# Patient Record
Sex: Female | Born: 1935
Health system: Southern US, Community
[De-identification: ages and names within clinical notes are randomized; demographics above are authoritative.]

## PROBLEM LIST (undated history)

## (undated) DIAGNOSIS — E7800 Pure hypercholesterolemia, unspecified: Secondary | ICD-10-CM

## (undated) DIAGNOSIS — F329 Major depressive disorder, single episode, unspecified: Secondary | ICD-10-CM

## (undated) DIAGNOSIS — J189 Pneumonia, unspecified organism: Secondary | ICD-10-CM

## (undated) DIAGNOSIS — G609 Hereditary and idiopathic neuropathy, unspecified: Secondary | ICD-10-CM

## (undated) DIAGNOSIS — I10 Essential (primary) hypertension: Secondary | ICD-10-CM

## (undated) DIAGNOSIS — I214 Non-ST elevation (NSTEMI) myocardial infarction: Secondary | ICD-10-CM

## (undated) DIAGNOSIS — Z9109 Other allergy status, other than to drugs and biological substances: Secondary | ICD-10-CM

## (undated) DIAGNOSIS — Z8719 Personal history of other diseases of the digestive system: Secondary | ICD-10-CM

## (undated) DIAGNOSIS — G43909 Migraine, unspecified, not intractable, without status migrainosus: Secondary | ICD-10-CM

## (undated) DIAGNOSIS — R079 Chest pain, unspecified: Secondary | ICD-10-CM

## (undated) DIAGNOSIS — R0602 Shortness of breath: Secondary | ICD-10-CM

## (undated) DIAGNOSIS — M8580 Other specified disorders of bone density and structure, unspecified site: Secondary | ICD-10-CM

## (undated) DIAGNOSIS — R51 Headache: Secondary | ICD-10-CM

## (undated) DIAGNOSIS — Z9289 Personal history of other medical treatment: Secondary | ICD-10-CM

## (undated) DIAGNOSIS — M1990 Unspecified osteoarthritis, unspecified site: Secondary | ICD-10-CM

## (undated) DIAGNOSIS — A310 Pulmonary mycobacterial infection: Secondary | ICD-10-CM

## (undated) DIAGNOSIS — F419 Anxiety disorder, unspecified: Secondary | ICD-10-CM

## (undated) DIAGNOSIS — I639 Cerebral infarction, unspecified: Secondary | ICD-10-CM

## (undated) DIAGNOSIS — F32A Depression, unspecified: Secondary | ICD-10-CM

## (undated) DIAGNOSIS — Z8619 Personal history of other infectious and parasitic diseases: Secondary | ICD-10-CM

## (undated) DIAGNOSIS — R519 Headache, unspecified: Secondary | ICD-10-CM

## (undated) DIAGNOSIS — I5189 Other ill-defined heart diseases: Secondary | ICD-10-CM

## (undated) DIAGNOSIS — E039 Hypothyroidism, unspecified: Secondary | ICD-10-CM

## (undated) DIAGNOSIS — K219 Gastro-esophageal reflux disease without esophagitis: Secondary | ICD-10-CM

## (undated) HISTORY — DX: Essential (primary) hypertension: I10

## (undated) HISTORY — DX: Other allergy status, other than to drugs and biological substances: Z91.09

## (undated) HISTORY — DX: Cerebral infarction, unspecified: I63.9

## (undated) HISTORY — PX: APPENDECTOMY: SHX54

## (undated) HISTORY — DX: Other specified disorders of bone density and structure, unspecified site: M85.80

## (undated) HISTORY — DX: Other ill-defined heart diseases: I51.89

## (undated) HISTORY — DX: Non-ST elevation (NSTEMI) myocardial infarction: I21.4

## (undated) HISTORY — DX: Hereditary and idiopathic neuropathy, unspecified: G60.9

## (undated) HISTORY — PX: DILATION AND CURETTAGE OF UTERUS: SHX78

---
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
## 1970-01-14 DIAGNOSIS — Z9289 Personal history of other medical treatment: Secondary | ICD-10-CM

## 1970-01-14 HISTORY — PX: TOTAL ABDOMINAL HYSTERECTOMY W/ BILATERAL SALPINGOOPHORECTOMY: SHX83

## 1970-01-14 HISTORY — DX: Personal history of other medical treatment: Z92.89

## 1976-11-14 DIAGNOSIS — Z8619 Personal history of other infectious and parasitic diseases: Secondary | ICD-10-CM

## 1976-11-14 HISTORY — DX: Personal history of other infectious and parasitic diseases: Z86.19

## 2006-04-18 ENCOUNTER — Encounter: Admission: RE | Admit: 2006-04-18 | Discharge: 2006-04-18 | Payer: Self-pay | Admitting: *Deleted

## 2007-03-29 ENCOUNTER — Emergency Department (HOSPITAL_COMMUNITY): Admission: EM | Admit: 2007-03-29 | Discharge: 2007-03-29 | Payer: Self-pay | Admitting: Emergency Medicine

## 2007-08-31 ENCOUNTER — Encounter: Admission: RE | Admit: 2007-08-31 | Discharge: 2007-08-31 | Payer: Self-pay | Admitting: *Deleted

## 2008-06-15 ENCOUNTER — Ambulatory Visit: Payer: Self-pay | Admitting: Emergency Medicine

## 2008-06-15 DIAGNOSIS — R05 Cough: Secondary | ICD-10-CM

## 2008-06-15 DIAGNOSIS — J45909 Unspecified asthma, uncomplicated: Secondary | ICD-10-CM

## 2008-06-15 DIAGNOSIS — I10 Essential (primary) hypertension: Secondary | ICD-10-CM | POA: Insufficient documentation

## 2008-06-15 DIAGNOSIS — J309 Allergic rhinitis, unspecified: Secondary | ICD-10-CM

## 2008-06-15 DIAGNOSIS — R059 Cough, unspecified: Secondary | ICD-10-CM | POA: Insufficient documentation

## 2008-07-29 ENCOUNTER — Ambulatory Visit: Payer: Self-pay | Admitting: Emergency Medicine

## 2008-07-29 DIAGNOSIS — J479 Bronchiectasis, uncomplicated: Secondary | ICD-10-CM | POA: Insufficient documentation

## 2008-08-10 ENCOUNTER — Ambulatory Visit: Payer: Self-pay | Admitting: Cardiology

## 2008-08-31 ENCOUNTER — Encounter: Admission: RE | Admit: 2008-08-31 | Discharge: 2008-08-31 | Payer: Self-pay | Admitting: Internal Medicine

## 2008-09-06 ENCOUNTER — Encounter: Admission: RE | Admit: 2008-09-06 | Discharge: 2008-09-06 | Payer: Self-pay | Admitting: Internal Medicine

## 2008-09-14 ENCOUNTER — Ambulatory Visit: Payer: Self-pay | Admitting: Emergency Medicine

## 2008-09-15 ENCOUNTER — Telehealth (INDEPENDENT_AMBULATORY_CARE_PROVIDER_SITE_OTHER): Payer: Self-pay | Admitting: *Deleted

## 2008-09-16 ENCOUNTER — Encounter: Payer: Self-pay | Admitting: Emergency Medicine

## 2008-11-04 ENCOUNTER — Encounter: Admission: RE | Admit: 2008-11-04 | Discharge: 2008-11-04 | Payer: Self-pay | Admitting: Internal Medicine

## 2008-11-15 ENCOUNTER — Ambulatory Visit: Payer: Self-pay | Admitting: Emergency Medicine

## 2008-12-23 ENCOUNTER — Encounter: Admission: RE | Admit: 2008-12-23 | Discharge: 2008-12-23 | Payer: Self-pay | Admitting: Internal Medicine

## 2009-02-28 ENCOUNTER — Ambulatory Visit: Payer: Self-pay | Admitting: Emergency Medicine

## 2009-07-06 ENCOUNTER — Encounter: Admission: RE | Admit: 2009-07-06 | Discharge: 2009-07-06 | Payer: Self-pay | Admitting: Internal Medicine

## 2009-08-23 ENCOUNTER — Ambulatory Visit: Payer: Self-pay | Admitting: Emergency Medicine

## 2010-02-05 ENCOUNTER — Encounter: Payer: Self-pay | Admitting: Internal Medicine

## 2010-02-13 NOTE — Assessment & Plan Note (Signed)
Summary: bronchiectasis   Visit Type:  Follow-up Primary Sarah [name]/Referring Sarah [name]:  Dr. Marijo Conception  CC:  Bronchiectasis.  the patient says she feels good. Occasional cough with dark mucus..  History of Present Illness: 77 yo never smoker, hx remote Legionella PNA, diagnosed with suspected asthma about 10 years ago. PFT's 7/10 with mixed disease, mild AFL. Formerly reated with Advair and albuterol, stopped when she moved to [place] in 2008. Tolerated the change well. Stayed on Singulair.   ROV 07/29/08 -- returns for f/u. Her cough is much improved. Last time we stopped enalapril, restarted fexofenadine. Stopped Advair also - doesn't think she's missed it, less hoarse. She has had PFT today as below.   ROV 09/14/08 -- Since last visit her cough got a little worse, productive of some small plugs, usually dark rarely with some blood-tinged mucous. PND is gone now that she is on an allergy regimen. Using ventolin rarely.   ROV 11/15/08 -- Returns after having sputum studies last visit - negative for AFB, fungal, bacterial. Developed viral URI symptoms about 10/20 - started to have some breathing pain, productive cough, yellow phlegm. Started on Augmentin, her cough and sputum improved. Didn't have to increase ventolin use. Still coughing frequently. Not using flutter on a schedule.   ROB 02/28/09 -- has been doing well since last time. Was treated with azithro in December after a URI, cough improved. Has albuterol available as needed. Was started on flovent with recent illness, but not taking now. Uses albuterol every couple days. Ran out of fexafenodine recently.   ROV 08/23/09 -- regular f/u for bronchiectasis. She is feling very well, the best she's been in over a year. Was treated for one episode of bronchitis in May treated with doxy. Hasn't been taking her fexofenadine, is on singulair. Taking flonase. Occas has used ventolin, about once a week.   Current Medications (verified): 1)  Levothroid 50 Mcg  Tabs (Levothyroxine Sodium) .... Once Daily 2)  Singulair 10 Mg Tabs (Montelukast Sodium) .... Once Daily 3)  Lexapro 20 Mg Tabs (Escitalopram Oxalate) .... Once Daily 4)  Ventolin Hfa 108 (90 Base) Mcg/act Aers (Albuterol Sulfate) .... As Needed 5)  Fexofenadine Hcl 180 Mg Tabs (Fexofenadine Hcl) .Marland Kitchen.. 1 By Mouth Once Daily 6)  Excedrin Extra Strength 250-250-65 Mg Tabs (Aspirin-Acetaminophen-Caffeine) .... As Directed As Needed 7)  Flonase 50 Mcg/act Susp (Fluticasone Propionate) .... 2 Sprays in Each Nostril Two Times A Day 8)  Lyrica 50 Mg Caps (Pregabalin) .... Take 1 Tablet By Mouth Two Times A Day  Allergies (verified): 1)  ! * Codeine Derivatives 2)  ! Levaquin 3)  ! Biaxin 4)  ! * Methacholine  Vital Signs:  Patient profile:   77 year old female Height:      68 inches (172.72 cm) Weight:      149.38 pounds (67.90 kg) BMI:     22.80 O2 Sat:      96 % on Room air Temp:     97.4 degrees F (36.33 degrees C) oral Pulse rate:   79 / minute BP sitting:   124 / 76  (right arm) Cuff size:   regular  Vitals Entered By: Michel Bickers CMA (August 23, 2009 9:49 AM)  O2 Sat at Rest %:  96 O2 Flow:  Room air CC: Bronchiectasis.  the patient says she feels good. Occasional cough with dark mucus. Comments Medications reviewed with the patient. Daytime phone verified. Michel Bickers CMA  August 23, 2009 9:49 AM   Physical Exam  General:  normal appearance, healthy appearing, and thin.   Head:  normocephalic and atraumatic Eyes:  conjunctiva and sclera clear Nose:  no deformity, discharge, inflammation, or lesions Mouth:  no deformity or lesions Neck:  no masses, thyromegaly, or abnormal cervical nodes Chest Wall:  kyphotic Lungs:  clear bilaterally Heart:  regular rate and rhythm, S1, S2 without murmurs, rubs, gallops, or clicks Abdomen:  not examined Msk:  no deformity or scoliosis noted with normal posture Extremities:  no clubbing, cyanosis, edema, or deformity  noted Neurologic:  non-focal Skin:  intact without lesions or rashes Psych:  alert and cooperative; normal mood and affect; normal attention span and concentration   Impression & Recommendations:  Problem # 1:  BRONCHIECTASIS (ICD-494.0)  Problem # 2:  ALLERGIC RHINITIS (ICD-477.9)  Her updated medication list for this problem includes:    Fexofenadine Hcl 180 Mg Tabs (Fexofenadine hcl) .Marland Kitchen... 1 by mouth once daily    Flonase 50 Mcg/act Susp (Fluticasone propionate) .Marland Kitchen... 2 sprays in each nostril two times a day  Orders: Est. Patient Level III (09811)  Problem # 3:  ASTHMA (ICD-493.90)  Patient Instructions: 1)  Continue your Singulair and Flonase as you are taking them.  2)  We can restart your fexofenadine if you begin to have more trouble with your allergies.  3)  Keep your Ventolin availabvle to use as needed  4)  Follow up with Dr Delton Coombes in 6 months or as needed

## 2010-02-13 NOTE — Assessment & Plan Note (Signed)
Summary: bronchiectasis   Visit Type:  Follow-up Primary Provider/Referring Provider:  Dr. Marijo Conception  CC:  Pt here for follow up. Pt was treated with zpack early December for fever and cough. Pt c/o productive cough with small amounts of yellow to white mucus and and S.O.B with activity. Pt states needs refill on Fexofenadine.  History of Present Illness: 77 yo never smoker, hx remote Legionella PNA, diagnosed with suspected asthma about 10 years ago. PFT's 7/10 with mixed disease, mild AFL. Formerly reated with Advair and albuterol, stopped when she moved to [place] in 2008. Tolerated the change well. Stayed on Singulair.   ROV 07/29/08 -- returns for f/u. Her cough is much improved. Last time we stopped enalapril, restarted fexofenadine. Stopped Advair also - doesn't think she's missed it, less hoarse. She has had PFT today as below.   ROV 09/14/08 -- Since last visit her cough got a little worse, productive of some small plugs, usually dark rarely with some blood-tinged mucous. PND is gone now that she is on an allergy regimen. Using ventolin rarely.   ROV 11/15/08 -- Returns after having sputum studies last visit - negative for AFB, fungal, bacterial. Developed viral URI symptoms about 10/20 - started to have some breathing pain, productive cough, yellow phlegm. Started on Augmentin, her cough and sputum improved. Didn't have to increase ventolin use. Still coughing frequently. Not using flutter on a schedule.   ROB 02/28/09 -- has been doing well since last time. Was treated with azithro in December after a URI, cough improved. Has albuterol available as needed. Was started on flovent with recent illness, but not taking now. Uses albuterol every couple days. Ran out of fexafenodine recently.   Current Medications (verified): 1)  Levothroid 50 Mcg Tabs (Levothyroxine Sodium) .... Once Daily 2)  Singulair 10 Mg Tabs (Montelukast Sodium) .... Once Daily 3)  Amitriptyline Hcl 10 Mg Tabs (Amitriptyline  Hcl) .... Once Daily 4)  Lexapro 20 Mg Tabs (Escitalopram Oxalate) .... Once Daily 5)  Ventolin Hfa 108 (90 Base) Mcg/act Aers (Albuterol Sulfate) .... As Needed 6)  Estradiol 0.5 Mg Tabs (Estradiol) .... 1/2 Once Daily 7)  Fexofenadine Hcl 180 Mg Tabs (Fexofenadine Hcl) .Marland Kitchen.. 1 By Mouth Once Daily 8)  Excedrin Extra Strength 250-250-65 Mg Tabs (Aspirin-Acetaminophen-Caffeine) .... As Directed As Needed 9)  Flonase 50 Mcg/act Susp (Fluticasone Propionate) .... 2 Sprays in Each Nostril Two Times A Day 10)  Lyrica 50 Mg Caps (Pregabalin) .... Take 1 Tablet By Mouth Two Times A Day  Allergies (verified): 1)  ! * Codeine Derivatives 2)  ! Levaquin 3)  ! Biaxin 4)  ! * Methacholine  Vital Signs:  Patient profile:   77 year old female Height:      68 inches Weight:      154.50 pounds O2 Sat:      96 % on Room air Temp:     97.5 degrees F oral Pulse rate:   76 / minute BP sitting:   138 / 68  (right arm) Cuff size:   regular  Vitals Entered By: Zackery Barefoot CMA (February 28, 2009 9:50 AM)  O2 Flow:  Room air CC: Pt here for follow up. Pt was treated with zpack early December for fever and cough. Pt c/o productive cough with small amounts of yellow to white mucus, and S.O.B with activity. Pt states needs refill on Fexofenadine Comments Medications reviewed with patient Verified pt's contact number Zackery Barefoot CMA  February 28, 2009 9:51 AM  Physical Exam  General:  normal appearance, healthy appearing, and thin.   Head:  normocephalic and atraumatic Eyes:  conjunctiva and sclera clear Nose:  no deformity, discharge, inflammation, or lesions Mouth:  no deformity or lesions Neck:  no masses, thyromegaly, or abnormal cervical nodes Chest Wall:  kyphotic Lungs:  bilateral insp squeeks, no wheeze Heart:  regular rate and rhythm, S1, S2 without murmurs, rubs, gallops, or clicks Abdomen:  not examined Msk:  no deformity or scoliosis noted with normal posture Extremities:   no clubbing, cyanosis, edema, or deformity noted Neurologic:  non-focal Skin:  intact without lesions or rashes Psych:  alert and cooperative; normal mood and affect; normal attention span and concentration   Impression & Recommendations:  Problem # 1:  BRONCHIECTASIS (ICD-494.0) No new interrvention at this time. CXR was clear in December when she flared.   Problem # 2:  ASTHMA (ICD-493.90) albuterol as needed. Will not continue the flovent  Problem # 3:  ALLERGIC RHINITIS (ICD-477.9) continue fluticisone two times a day, restart fexofenadine.   Medications Added to Medication List This Visit: 1)  Lyrica 50 Mg Caps (Pregabalin) .... Take 1 tablet by mouth two times a day  Other Orders: Est. Patient Level III (93716)  Patient Instructions: 1)  Continue your albuterol as needed  2)  Do not restart flovent 3)  Continue fluticisone nasal spray two times a day  4)  Restart fexofenadine once daily  5)  Follow up in 6 months or as needed  Prescriptions: VENTOLIN HFA 108 (90 BASE) MCG/ACT AERS (ALBUTEROL SULFATE) as needed  #1 x 3   Entered and Authorized by:   Leslye Peer MD   Signed by:   Leslye Peer MD on 02/28/2009   Method used:   Electronically to        [facility] [address] [phone]* (retail)       [address]       [place], [place]  [address]       Ph: [phone] or [phone]       Fax: [phone]   RxID:   [id number] FLONASE 50 MCG/ACT SUSP (FLUTICASONE PROPIONATE) 2 sprays in each nostril two times a day  #1 x 11   Entered and Authorized by:   Leslye Peer MD   Signed by:   Leslye Peer MD on 02/28/2009   Method used:   Electronically to        [facility] [address] [phone]* (retail)       [address]       [place], [place]  [address]       Ph: [phone] or [phone]       Fax: [phone]   RxID:   [id number] FEXOFENADINE HCL 180 MG TABS (FEXOFENADINE HCL) 1 by mouth once daily  #30 x 11    Entered and Authorized by:   Leslye Peer MD   Signed by:   Leslye Peer MD on 02/28/2009   Method used:   Electronically to        [facility] [address] [phone]* (retail)       [address]       [place], [place]  [address]       Ph: [phone] or [phone]       Fax: [phone]  RxID:   [id number]    Immunization History:  Influenza Immunization History:    Influenza:  historical (10/17/2008)  Pneumovax Immunization History:    Pneumovax:  historical (02/19/1999)

## 2010-03-15 ENCOUNTER — Ambulatory Visit (INDEPENDENT_AMBULATORY_CARE_PROVIDER_SITE_OTHER): Payer: Medicare Other | Admitting: Emergency Medicine

## 2010-03-15 ENCOUNTER — Encounter: Payer: Self-pay | Admitting: Emergency Medicine

## 2010-03-15 DIAGNOSIS — J479 Bronchiectasis, uncomplicated: Secondary | ICD-10-CM

## 2010-03-15 DIAGNOSIS — R05 Cough: Secondary | ICD-10-CM

## 2010-03-22 NOTE — Assessment & Plan Note (Signed)
Summary: bronchiectasis   Visit Type:  Follow-up Primary Provider/Referring Provider:  Dr. Marijo Conception  CC:  Bronchiectasis.  Pt states she is doing well right now.  She says she had bronchitis in Dec. 2011.  Cough is prod in the mornings w/ yellow to dark colored sputum.Marland Kitchen  History of Present Illness: 77 yo never smoker, hx remote Legionella PNA, diagnosed with suspected asthma about 10 years ago. PFT's 7/10 with mixed disease, mild AFL. Formerly treated with Advair and albuterol, stopped when she moved to [place] in 2008. Tolerated the change well. Stayed on Singulair. At original consult we d/c'd ACE-I,   ROV 09/14/08 -- Since last visit her cough got a little worse, productive of some small plugs, usually dark rarely with some blood-tinged mucous. PND is gone now that she is on an allergy regimen. Using ventolin rarely.   ROV 11/15/08 -- Returns after having sputum studies last visit - negative for AFB, fungal, bacterial. Developed viral URI symptoms about 10/20 - started to have some breathing pain, productive cough, yellow phlegm. Started on Augmentin, her cough and sputum improved. Didn't have to increase ventolin use. Still coughing frequently. Not using flutter on a schedule.   ROB 02/28/09 -- has been doing well since last time. Was treated with azithro in December after a URI, cough improved. Has albuterol available as needed. Was started on flovent with recent illness, but not taking now. Uses albuterol every couple days. Ran out of fexafenodine recently.   ROV 08/23/09 -- regular f/u for bronchiectasis. She is feling very well, the best she's been in over a year. Was treated for one episode of bronchitis in May treated with doxy. Hasn't been taking her fexofenadine, is on singulair. Taking flonase. Occas has used ventolin, about once a week.   ROV 03/15/10 -- returns for her bronchiectasis, mild AFL. She had a URI in Dec '11, was treated for bronchitis. Now feeling better. Using Ventolin qod. Going  to the gym 2x a week. Coughs up dark plugs. Using flutter every other day.   Preventive Screening-Counseling & Management  Alcohol-Tobacco     Alcohol drinks/day: 1     Alcohol type: wine     Smoking Status: never  Current Medications (verified): 1)  Levothroid 50 Mcg Tabs (Levothyroxine Sodium) .... Once Daily 2)  Singulair 10 Mg Tabs (Montelukast Sodium) .... Once Daily 3)  Lexapro 20 Mg Tabs (Escitalopram Oxalate) .... Once Daily 4)  Ventolin Hfa 108 (90 Base) Mcg/act Aers (Albuterol Sulfate) .... As Needed 5)  Excedrin Extra Strength [phone] Mg Tabs (Aspirin-Acetaminophen-Caffeine) .... As Directed As Needed 6)  Flonase 50 Mcg/act Susp (Fluticasone Propionate) .... 2 Sprays in Each Nostril Once Daily 7)  Lyrica 50 Mg Caps (Pregabalin) .... Take 1 Tablet By Mouth Two Times A Day  Allergies (verified): 1)  ! * Codeine Derivatives 2)  ! Levaquin 3)  ! Biaxin 4)  ! * Methacholine  Vital Signs:  Patient profile:   77 year old female Height:      68 inches Weight:      146.50 pounds O2 Sat:      95 % on Room air Temp:     97.7 degrees F oral Pulse rate:   77 / minute BP sitting:   120 / 78  (left arm) Cuff size:   regular  Vitals Entered By: Michel Bickers CMA (March 15, 2010 3:01 PM)  O2 Sat at Rest %:  95 O2 Flow:  Room air CC: Bronchiectasis.  Pt states she  is doing well right now.  She says she had bronchitis in Dec. 2011.  Cough is prod in the mornings w/ yellow to dark colored sputum. Comments Medications reviewed with patient Michel Bickers CMA  March 15, 2010 3:01 PM   Physical Exam  General:  normal appearance, healthy appearing, and thin.   Head:  normocephalic and atraumatic Eyes:  conjunctiva and sclera clear Nose:  no deformity, discharge, inflammation, or lesions Mouth:  no deformity or lesions Neck:  no masses, thyromegaly, or abnormal cervical nodes Chest Wall:  kyphotic Lungs:  clear bilaterally Heart:  regular rate and rhythm, S1, S2 without murmurs,  rubs, gallops, or clicks Abdomen:  not examined Msk:  no deformity or scoliosis noted with normal posture Extremities:  no clubbing, cyanosis, edema, or deformity noted Neurologic:  non-focal Skin:  intact without lesions or rashes Psych:  alert and cooperative; normal mood and affect; normal attention span and concentration   Impression & Recommendations:  Problem # 1:  BRONCHIECTASIS (ICD-494.0) Stable, had an exacerbation in december treated w abx - as needed ventolin - singulair - add back fexofenadine in the allergy season - flutter valve - rov 6 months  Problem # 2:  COUGH (ICD-786.2)  Medications Added to Medication List This Visit: 1)  Flonase 50 Mcg/act Susp (Fluticasone propionate) .... 2 sprays in each nostril once daily  Other Orders: Est. Patient Level IV ([address])  Patient Instructions: 1)  Continue your flutter valve at least every other day. You could consider increasing the frequency to once daily to see if this manages your cough better.  2)  Continue your Ventolin as needed 3)  Continue your Singulair 4)  Consider adding back fexofenadine once daily during the Spring and Fall months. 5)  Follow up with Dr Delton Coombes in 6 months or as needed    Immunization History:  Influenza Immunization History:    Influenza:  historical (11/14/2009)

## 2010-07-25 ENCOUNTER — Other Ambulatory Visit: Payer: Self-pay | Admitting: Internal Medicine

## 2010-07-25 ENCOUNTER — Ambulatory Visit
Admission: RE | Admit: 2010-07-25 | Discharge: 2010-07-25 | Disposition: A | Discharge status: Home / Self Care | Payer: Medicare Other | Source: Ambulatory Visit | Attending: Internal Medicine | Admitting: Internal Medicine

## 2010-07-25 DIAGNOSIS — J449 Chronic obstructive pulmonary disease, unspecified: Secondary | ICD-10-CM

## 2010-07-25 DIAGNOSIS — R062 Wheezing: Secondary | ICD-10-CM

## 2010-07-25 DIAGNOSIS — N644 Mastodynia: Secondary | ICD-10-CM

## 2010-07-26 ENCOUNTER — Other Ambulatory Visit: Payer: Medicare Other

## 2010-07-27 ENCOUNTER — Ambulatory Visit
Admission: RE | Admit: 2010-07-27 | Discharge: 2010-07-27 | Disposition: A | Discharge status: Home / Self Care | Payer: Medicare Other | Source: Ambulatory Visit | Attending: Internal Medicine | Admitting: Internal Medicine

## 2010-07-27 ENCOUNTER — Other Ambulatory Visit: Payer: Self-pay | Admitting: Internal Medicine

## 2010-07-27 DIAGNOSIS — N644 Mastodynia: Secondary | ICD-10-CM

## 2010-07-27 DIAGNOSIS — R911 Solitary pulmonary nodule: Secondary | ICD-10-CM

## 2010-08-15 HISTORY — PX: CARDIAC CATHETERIZATION: SHX172

## 2010-08-22 ENCOUNTER — Ambulatory Visit (INDEPENDENT_AMBULATORY_CARE_PROVIDER_SITE_OTHER): Payer: Medicare Other | Admitting: Gynecology

## 2010-08-22 ENCOUNTER — Encounter: Payer: Self-pay | Admitting: Gynecology

## 2010-08-22 VITALS — BP 130/72 | Ht 65.5 in | Wt 141.0 lb

## 2010-08-22 DIAGNOSIS — N644 Mastodynia: Secondary | ICD-10-CM

## 2010-08-22 DIAGNOSIS — N951 Menopausal and female climacteric states: Secondary | ICD-10-CM

## 2010-08-22 DIAGNOSIS — G609 Hereditary and idiopathic neuropathy, unspecified: Secondary | ICD-10-CM | POA: Insufficient documentation

## 2010-08-22 DIAGNOSIS — R5381 Other malaise: Secondary | ICD-10-CM

## 2010-08-22 DIAGNOSIS — J471 Bronchiectasis with (acute) exacerbation: Secondary | ICD-10-CM | POA: Insufficient documentation

## 2010-08-22 DIAGNOSIS — N952 Postmenopausal atrophic vaginitis: Secondary | ICD-10-CM

## 2010-08-22 DIAGNOSIS — Z9109 Other allergy status, other than to drugs and biological substances: Secondary | ICD-10-CM | POA: Insufficient documentation

## 2010-08-22 NOTE — Progress Notes (Signed)
Sarah [name] [birth year] [id number]        77 y.o.  Presents to discuss ERT. Patient is status post TAH/BSO number of years ago for leiomyoma menorrhagia. She has been on ERT since then initially with Premarin, then Estratest and then estradiol. She recently moved to this area in the last 2 years, saw her primary who became uncomfortable with her on estradiol discontinued it and since then the patient has noticed increased hot flashes, sleep disturbances headaches, worsening depression and fatigue. She is being followed for depression she is on citalopram and also on Lyrica for her peripheral neuropathy. She does take levo thyroid for thyroid suppression apparently has a small nodule that had been evaluated and found to be benign and there suppressing her with thyroid replacement. She relates having her thyroid level recently checked. Patient very strongly wants to reinitiate ERT and her primary suggestioned she see a gynecologist to discuss this.  Patient also is complaining of some right sided breast discomfort over the last several months not well localized but more general comes and goes relates having had mammogram recently which was normal. No discharge or other symptoms.  Past medical history,surgical history, allergies, family history and social history were all reviewed and documented in the EPIC chart. ROS:  Was performed and pertinent positives and negatives are included in the history.  Exam: chaperone present Filed Vitals:   08/22/10 1032  BP: 130/72   General appearance  Normal Skin grossly normal Head/Neck normal with no cervical or supraclavicular adenopathy thyroid normal Abdominal  soft, nontender, without masses, organomegaly or hernia Breasts  examined lying and sitting without masses, retractions, discharge or axillary adenopathy. Pelvic  Ext/BUS/vagina  normal with atrophic changes  Cervix  absent  Uterus absent  Adnexa  Without masses or tenderness    Anus and perineum   normal   Rectovaginal  normal sphincter tone without palpated masses or tenderness.    Assessment/Plan:  77 y.o. female for discussion: #1  ERT. She is being followed for peripheral neuropathy and bronchiectasis otherwise is healthy from a cardiovascular standpoint never smoked and is active. Discussed the whole issue of ERT with her to include the WHI study, ACOG and NAMS statements.  Possible increased risks of stroke, heart attack, DVT as well as breast cancer risk was reviewed. I discussed particularly given her age that she would be considered an increased risk for these complications. I reviewed with her that by 73 most patient's are done with ERT. Alternatives such as soy or psychoactive medications were reviewed although she also is on citalopram now and I don't think adding another would be wise. I reviewed the issue of first pass effect and that the use of transdermal may lower the risk of thrombosis. After a very lengthy discussion and the patient's realistic understanding of the risks, weboth agree to try a transdermal ERT. I gave her 2 sample weeks of Vivelle dot 0.0375 and we'll see how she does with this low dose.  If she does well she'll call me in 2 weeks we'll refill this if it does not seem to help her substantially than we will increase her to the  0.05 dose.  No blood work or studies were ordered as this is all done through her primary. #2 Mastodynia. Patient notes some right-sided breast pain. Her breast exam today is normal lying and sitting and she recently had a negative mammogram. Patient will monitor at present after initiating ERT. If her discomfort resolves we'll follow if it would  persist or certainly worsen she'll return sent for further evaluation.   Dara Lords MD, 12:08 PM 08/22/2010

## 2010-08-24 ENCOUNTER — Encounter: Payer: Self-pay | Admitting: Emergency Medicine

## 2010-08-24 ENCOUNTER — Ambulatory Visit (INDEPENDENT_AMBULATORY_CARE_PROVIDER_SITE_OTHER): Payer: Medicare Other | Admitting: Emergency Medicine

## 2010-08-24 DIAGNOSIS — J479 Bronchiectasis, uncomplicated: Secondary | ICD-10-CM

## 2010-08-24 DIAGNOSIS — J309 Allergic rhinitis, unspecified: Secondary | ICD-10-CM

## 2010-08-24 NOTE — Assessment & Plan Note (Signed)
Fluticasone spray

## 2010-08-24 NOTE — Progress Notes (Signed)
77 yo never smoker, hx remote Legionella PNA, diagnosed with suspected asthma about 10 years ago. PFT's 7/10 with mixed disease, mild AFL. Formerly treated with Advair and albuterol, stopped when she moved to [place] in 2008. Tolerated the change well. Stayed on Singulair. At original consult we d/c'd ACE-I,   ROV 09/14/08 -- Since last visit her cough got a little worse, productive of some small plugs, usually dark rarely with some blood-tinged mucous. PND is gone now that she is on an allergy regimen. Using ventolin rarely.   ROV 11/15/08 -- Returns after having sputum studies last visit - negative for AFB, fungal, bacterial. Developed viral URI symptoms about 10/20 - started to have some breathing pain, productive cough, yellow phlegm. Started on Augmentin, her cough and sputum improved. Didn't have to increase ventolin use. Still coughing frequently. Not using flutter on a schedule.   ROB 02/28/09 -- has been doing well since last time. Was treated with azithro in December after a URI, cough improved. Has albuterol available as needed. Was started on flovent with recent illness, but not taking now. Uses albuterol every couple days. Ran out of fexafenodine recently.   ROV 08/23/09 -- regular f/u for bronchiectasis. She is feling very well, the best she's been in over a year. Was treated for one episode of bronchitis in May treated with doxy. Hasn't been taking her fexofenadine, is on singulair. Taking flonase. Occas has used ventolin, about once a week.   ROV 03/15/10 -- returns for her bronchiectasis, mild AFL. She had a URI in Dec '11, was treated for bronchitis. Now feeling better. Using Ventolin qod. Going to the gym 2x a week. Coughs up dark plugs. Using flutter every other day.   ROV 08/24/10 --  bronchiectasis, mild AFL. Regular f/u. Coughing less, occasionally some dark sputum. CXR done July showed some scar and ? Nodularity. In general she is feeling well. No abx since last visit. Has not been needing  flutter valve every day.   Gen: Pleasant, well-nourished, in no distress,  normal affect  ENT: No lesions,  mouth clear,  oropharynx clear, no postnasal drip  Neck: No JVD, no TMG, no carotid bruits  Lungs: No use of accessory muscles, no dullness to percussion, few scattered insp crackles R>L  Cardiovascular: RRR, heart sounds normal, no murmur or gallops, no peripheral edema  Musculoskeletal: No deformities, no cyanosis or clubbing  Neuro: alert, non focal  Skin: Warm, no lesions or rashes  Bronchiectasis Continue flutter valve as needed Albuterol prn.  ROV in 6 months or prn   ALLERGIC RHINITIS Fluticasone spray

## 2010-08-24 NOTE — Assessment & Plan Note (Signed)
Continue flutter valve as needed Albuterol prn.  ROV in 6 months or prn

## 2010-08-24 NOTE — Patient Instructions (Addendum)
Please continue your fluticasone nasal spray as needed Continue to exercise as you are doing Use your flutter valve as needed Follow up with Dr Delton Coombes in 6 months or sooner if you have any problems.

## 2010-09-04 ENCOUNTER — Telehealth: Payer: Self-pay | Admitting: *Deleted

## 2010-09-04 DIAGNOSIS — N951 Menopausal and female climacteric states: Secondary | ICD-10-CM

## 2010-09-04 MED ORDER — ESTRADIOL 0.05 MG/24HR TD PTTW: 1.0000 | MEDICATED_PATCH | TRANSDERMAL | Status: DC

## 2010-09-04 NOTE — Telephone Encounter (Signed)
We will try increased dose of Vivelle 0.05 mg patch for several weeks have her Collis in followup to see how she's doing

## 2010-09-04 NOTE — Telephone Encounter (Signed)
PT CALLING TO FOLLOW UP YQ:MVHQIO VISIT 08/22/10  VIVELLE PATCH 0.0375. PT STATES THE PATCH NOT WORKING 100% STILL HAVING SLIGHT HOT FLASHES. PLEASE ADVISE.

## 2010-09-05 ENCOUNTER — Emergency Department (HOSPITAL_COMMUNITY): Payer: Medicare Other

## 2010-09-05 ENCOUNTER — Inpatient Hospital Stay (HOSPITAL_COMMUNITY)
Admission: EM | Admit: 2010-09-05 | Discharge: 2010-09-07 | DRG: 282 | Disposition: A | Discharge status: Home / Self Care | Payer: Medicare Other | Source: Ambulatory Visit | Attending: Cardiovascular Disease | Admitting: Cardiovascular Disease

## 2010-09-05 DIAGNOSIS — I214 Non-ST elevation (NSTEMI) myocardial infarction: Principal | ICD-10-CM | POA: Diagnosis present

## 2010-09-05 DIAGNOSIS — Z79899 Other long term (current) drug therapy: Secondary | ICD-10-CM

## 2010-09-05 DIAGNOSIS — I10 Essential (primary) hypertension: Secondary | ICD-10-CM | POA: Diagnosis present

## 2010-09-05 DIAGNOSIS — J45909 Unspecified asthma, uncomplicated: Secondary | ICD-10-CM | POA: Diagnosis present

## 2010-09-05 DIAGNOSIS — G609 Hereditary and idiopathic neuropathy, unspecified: Secondary | ICD-10-CM | POA: Diagnosis present

## 2010-09-05 DIAGNOSIS — J479 Bronchiectasis, uncomplicated: Secondary | ICD-10-CM | POA: Diagnosis present

## 2010-09-05 DIAGNOSIS — Z7982 Long term (current) use of aspirin: Secondary | ICD-10-CM

## 2010-09-05 LAB — URINALYSIS, ROUTINE W REFLEX MICROSCOPIC
Bilirubin Urine: NEGATIVE
Ketones, ur: 15 mg/dL — AB
Nitrite: NEGATIVE
Urobilinogen, UA: 1 mg/dL (ref 0.0–1.0)

## 2010-09-05 LAB — COMPREHENSIVE METABOLIC PANEL
ALT: 11 U/L (ref 0–35)
AST: 22 U/L (ref 0–37)
Alkaline Phosphatase: 117 U/L (ref 39–117)
CO2: 30 mEq/L (ref 19–32)
Chloride: 98 mEq/L (ref 96–112)
Creatinine, Ser: 0.77 mg/dL (ref 0.50–1.10)
GFR calc non Af Amer: >60 mL/min (ref >60)
Sodium: 138 mEq/L (ref 135–145)
Total Bilirubin: 0.2 mg/dL — ABNORMAL LOW (ref 0.3–1.2)

## 2010-09-05 LAB — CBC
MCH: 28.1 pg (ref 26.0–34.0)
MCV: 88.1 fL (ref 78.0–100.0)
Platelets: 241 10*3/uL (ref 150–400)
RBC: 4.37 MIL/uL (ref 3.87–5.11)

## 2010-09-05 LAB — DIFFERENTIAL
Eosinophils Absolute: 0.1 10*3/uL (ref 0.0–0.7)
Lymphs Abs: 1.4 10*3/uL (ref 0.7–4.0)
Monocytes Relative: 8 % (ref 3–12)
Neutrophils Relative %: 72 % (ref 43–77)

## 2010-09-05 LAB — APTT: aPTT: 35 seconds (ref 24–37)

## 2010-09-05 MED ORDER — IOHEXOL 300 MG/ML  SOLN
100.0000 mL | Freq: Once | INTRAMUSCULAR | Status: AC | PRN
  Administered 2010-09-05: 100 mL via INTRAVENOUS

## 2010-09-05 NOTE — Telephone Encounter (Signed)
PT INFORMED AND WILL FOLLOW UP TO SEE HOW RX IS DOING

## 2010-09-06 DIAGNOSIS — I214 Non-ST elevation (NSTEMI) myocardial infarction: Secondary | ICD-10-CM

## 2010-09-06 LAB — URINE CULTURE: Colony Count: 50000

## 2010-09-06 LAB — BASIC METABOLIC PANEL
Chloride: 99 mEq/L (ref 96–112)
Creatinine, Ser: 0.94 mg/dL (ref 0.50–1.10)
GFR calc Af Amer: >60 mL/min (ref >60)
GFR calc non Af Amer: 56 mL/min — ABNORMAL LOW (ref >60)
Potassium: 3.5 mEq/L (ref 3.5–5.1)

## 2010-09-06 LAB — CARDIAC PANEL(CRET KIN+CKTOT+MB+TROPI)
Relative Index: INVALID (ref 0.0–2.5)
Troponin I: 0.47 ng/mL (ref <0.30)

## 2010-09-06 LAB — CBC
Hemoglobin: 12.1 g/dL (ref 12.0–15.0)
MCH: 27.9 pg (ref 26.0–34.0)
MCV: 88 fL (ref 78.0–100.0)
RBC: 4.33 MIL/uL (ref 3.87–5.11)

## 2010-09-06 LAB — POCT ACTIVATED CLOTTING TIME: Activated Clotting Time: 122 seconds

## 2010-09-06 LAB — LIPID PANEL
Cholesterol: 181 mg/dL (ref 0–200)
HDL: 68 mg/dL (ref >39)
Total CHOL/HDL Ratio: 2.7 RATIO
Triglycerides: 50 mg/dL (ref <150)
VLDL: 10 mg/dL (ref 0–40)

## 2010-09-06 LAB — TSH: TSH: 1.37 u[IU]/mL (ref 0.350–4.500)

## 2010-09-06 LAB — HEMOGLOBIN A1C: Hgb A1c MFr Bld: 6.1 % — ABNORMAL HIGH (ref <5.7)

## 2010-09-07 LAB — BASIC METABOLIC PANEL
CO2: 28 mEq/L (ref 19–32)
Chloride: 104 mEq/L (ref 96–112)
Creatinine, Ser: 0.85 mg/dL (ref 0.50–1.10)
GFR calc Af Amer: >60 mL/min (ref >60)
Potassium: 4.1 mEq/L (ref 3.5–5.1)
Sodium: 138 mEq/L (ref 135–145)

## 2010-09-07 LAB — CBC
MCV: 87.7 fL (ref 78.0–100.0)
Platelets: 228 10*3/uL (ref 150–400)
RBC: 4.07 MIL/uL (ref 3.87–5.11)
RDW: 14.1 % (ref 11.5–15.5)
WBC: 8.2 10*3/uL (ref 4.0–10.5)

## 2010-09-11 NOTE — Cardiovascular Report (Signed)
  NAMEJINNA, Sarah [name]              ACCOUNT NO.:  [id number]  MEDICAL RECORD NO.:  [id number]  LOCATION:  2031                         FACILITY:  [facility]  PHYSICIAN:  Verne Carrow, MDDATE OF BIRTH:  1921/12/11  DATE OF PROCEDURE:  09/06/2010 DATE OF DISCHARGE:                           CARDIAC CATHETERIZATION   PROCEDURES PERFORMED: 1. Left heart catheterization. 2. Selective coronary angiography. 3. Left ventricular angiogram.  OPERATOR:  Verne Carrow, MD  INDICATION:  Non-ST-elevation myocardial infarction.  DETAILS OF PROCEDURE:  The patient was brought to the inpatient cardiac catheterization laboratory after signing informed consent for the procedure.  The right groin was prepped and draped in sterile fashion. A 1% lidocaine was used for local anesthesia.  A 5-French sheath was inserted into the right femoral artery without difficulty.  Standard diagnostic catheters were used to perform selective coronary angiography.  A pigtail catheter was used to perform a left ventricular angiogram.  The patient tolerated the procedure well.  The patient was taken to the recovery area in stable condition.  HEMODYNAMIC FINDINGS:  Central aortic pressure 157/72.  Left ventricular pressure 159/3/19.  ANGIOGRAPHIC FINDINGS: 1. The left main coronary artery had no evidence of disease. 2. The left anterior descending was a large vessel that coursed to the     apex and gave off a large caliber diagonal branch.  There was no     evidence of disease in this vessel. 3. The circumflex artery gave off two early small-caliber obtuse     marginal branches that were free of disease.  The circumflex then     gave off a large caliber bifurcating obtuse marginal branch that     was free of disease.  The AV groove circumflex became small in     caliber beyond the takeoff of the large obtuse marginal branch. 4. The right coronary artery is a large dominant vessel with no     evidence  of disease. 5. Left ventricular angiogram was performed in the RAO projection and     showed normal left ventricular systolic function with ejection     fraction of 55%.  IMPRESSION: 1. No angiographic evidence of coronary artery disease. 2. Normal left ventricular systolic function. 3. Questionable etiology of non-ST elevation myocardial infarction.  RECOMMENDATIONS:  No further ischemic workup at this time.  I will review with her primary cardiologist and discuss further treatment plan.     Verne Carrow, MD     CM/MEDQ  D:  09/06/2010  T:  09/07/2010  Job:  [id number]  Electronically Signed by Verne Carrow MD on 09/11/2010 03:16:06 PM

## 2010-09-11 NOTE — Discharge Summary (Addendum)
Sarah [name], Sarah [name]              ACCOUNT NO.:  [id number]  MEDICAL RECORD NO.:  [id number]  LOCATION:  2031                         FACILITY:  [facility]  PHYSICIAN:  Vesta Mixer, M.D. DATE OF BIRTH:  1921-11-09  DATE OF ADMISSION:  09/05/2010 DATE OF DISCHARGE:  09/07/2010                              DISCHARGE SUMMARY   DISCHARGE DIAGNOSES: 1. Non-ST-elevation myocardial infarction.     a.     No evidence of coronary artery disease on cath September 06, 2010.     b.     CT angio on September 05, 2010, negative for pulmonary      embolism.     c.     Question related to episode of hypertension. 2. Asthma. 3. Idiopathic peripheral neuropathy. 4. Hypertension. 5. Status post hysterectomy.  HOSPITAL COURSE:  Sarah [name] is an 77 year old female with history of asthma and hypertension who presented with an episode of left-sided chest fullness on and off for 2 days associated with increased work of breathing and fatigue.  Sarah [name] denied any diaphoresis, nausea, jaw, back, or shoulder pain.  There were no aggravating or alleviating factors. Sarah chest heaviness was associated with a sense of anxiety.  Sarah [name] was brought into [facility] hospital and cardiac markers demonstrated positive troponins with a peak of 0.52.  Sarah [name] was initiated on ACS protocol with full-dose Lovenox and set up for heart catheterization.  In Sarah interim, Sarah [name] was found to be a friend of one of Sarah [place] coworkers and Sarah [name] herself requested transfer to [facility].  Sarah [name] underwent heart catheterization yesterday by Dr. Clifton James demonstrating no angiographic evidence of coronary artery disease with questionable etiology of Sarah [name] NSTEMI.  In Sarah interim, Sarah [name] D-dimer was noted to be mildly positive and CT angio demonstrated no evidence of pulmonary embolism.  Dr. Elease Hashimoto postulated that maybe Sarah [name] NSTEMI is related to Sarah [name] episode of hypertension as Sarah [name] blood pressure was elevated on admission. 2-D  echocardiogram was obtained showing findings below.  On Sarah day of discharge, Sarah [name] was feeling better.  Dr. Elease Hashimoto has seen and examined Sarah [name] today and feels Sarah [name] is stable for discharge.  DISCHARGE LABS:  WBC 8.2, hemoglobin 11.5, hematocrit 35.7, platelet count 228.  D-dimer 0.93. Sodium 138, potassium 4.1, chloride 104, CO2 of 28, glucose 88, BUN 17, creatinine 0.85.  LFTs were normal at 822. Magnesium 2.3.  A1c of 7.1.  Peak troponin 0.52.  Peak MB 4.2.  CKs were normal.  BNP 1509.  Total cholesterol 181, triglycerides 50, HDL 68, LDL 103.  TSH and free T4 were normal.  STUDIES: 1. Cardiac catheterization September 06, 2010, demonstrated no     angiographic evidence of coronary artery disease.  Normal LV     systolic function, EF of 55%. 2. 2-D echocardiogram September 06, 2010, demonstrated borderline     concentric LVH.  EF of 55% to 60%.  Wall motion was normal.  There     were no regional wall motion abnormalities.  Grade 1 diastolic     dysfunction.  Mild mitral regurgitation.  Mild leaflet prolapse of     both mitral leaflets.  Trivial pericardial effusion  was identified.  DISCHARGE MEDICATIONS: 1. Aspirin 81 mg daily. 2. Lisinopril 2.5 mg daily. 3. Metoprolol tartrate 25 mg half tablet b.i.d. 4. Citalopram 40 mg half tablet daily.  This was changed from 40 mg     full tablet daily as Sarah FDA has recommended no higher dose of 20     mg daily for patients over 35 years of age.  Sarah [name] is to talk to Sarah [name]     PCPs office. 5. Amlodipine 10 mg half a tablet daily. 6. Combivent 2 puffs inhaled q.6 h. p.r.n. shortness of breath. 7. Levothyroxine 50 mcg daily. 8. Lyrica 60 mg b.i.d. 9. Singulair 10 mg daily. 10.Vivelle-Dot 0.05 mg one patch topically weekly.  DISPOSITION:  Sarah [name] will be discharged in stable condition to home. Sarah [name] is to increase activity slowly, not to lift anything over 5 pounds for 1 week, drive for 2 days, or participate in sexual activity for 1 week.  Sarah [name]  is to follow a heart-healthy diet and to call or return if Sarah [name] notices any pain, swelling, bleeding, or pus at Sarah [name] cath site.  Sarah [name] will see Norma Fredrickson, NP and Dr. Harvie Bridge office for Sarah [name] first post hospital visit on September 24, 2010, at 9:30 a.m.  DURATION OF DISCHARGE ENCOUNTER:  Greater than 30 minutes including physician and PA time.     Ronie Spies, P.A.C.   ______________________________ Vesta Mixer, M.D.    DD/MEDQ  D:  09/07/2010  T:  09/07/2010  Job:  [id number]  cc:   Vesta Mixer, M.D.  Electronically Signed by Ronie Spies  on 09/24/2010 06:32:04 PM Electronically Signed by Kristeen Miss M.D. on 09/24/2010 07:42:45 PM

## 2010-09-12 ENCOUNTER — Telehealth: Payer: Self-pay | Admitting: Nurse Practitioner

## 2010-09-12 NOTE — Telephone Encounter (Signed)
Daughter called stating her Mom has noticed a "knot" in groin where she was cathed. She is not sure how long it has been there. States it bothers her when she walks. No redness or swelling. Wants to bring her here this afternoon. She called at 4:02. She can not tell me if there is a pulsating knot. Advised to take her to the ER so they would be able to doppler studies. She wanted to just bring her in today or in AM for someone to look at it. Advised we would still have to get doppler study. She will call back if needs earlier app. She was cathed by Dr. Sanjuana Kava.

## 2010-09-12 NOTE — Telephone Encounter (Signed)
Pt was discharged late last week from [facility] cardiac cath through groin.  Mother may have hematoma in groin.  Pt's daughter would like a call back before closing.  Pt's daughter wants to bring her in tonight.  Explained to pt daughter, since it is after 4pm she may get called back tomorrow.  Pt aware to bring mother to ER if symptoms worsen.  Pt has POST Card Cath OV with Lori on 09/24/10. Please call daughter back.

## 2010-09-12 NOTE — Telephone Encounter (Signed)
lm

## 2010-09-24 ENCOUNTER — Encounter: Payer: Self-pay | Admitting: Nurse Practitioner

## 2010-09-24 ENCOUNTER — Ambulatory Visit (INDEPENDENT_AMBULATORY_CARE_PROVIDER_SITE_OTHER): Payer: Medicare Other | Admitting: Nurse Practitioner

## 2010-09-24 VITALS — BP 180/80 | HR 62 | Ht 67.0 in | Wt 142.2 lb

## 2010-09-24 DIAGNOSIS — Z9889 Other specified postprocedural states: Secondary | ICD-10-CM

## 2010-09-24 DIAGNOSIS — I10 Essential (primary) hypertension: Secondary | ICD-10-CM

## 2010-09-24 DIAGNOSIS — I214 Non-ST elevation (NSTEMI) myocardial infarction: Secondary | ICD-10-CM

## 2010-09-24 MED ORDER — METOPROLOL SUCCINATE ER 25 MG PO TB24: ORAL_TABLET | ORAL | Status: DC

## 2010-09-24 NOTE — Patient Instructions (Addendum)
Stay on your current medicines except I want you to increase your Metoprolol to a whole tablet each day We will see you back in about 1 month to check your blood pressure.  Monitor your blood pressure at home.  Record your readings and bring to your next visit. Limit sodium intake. Call for any problems.  You may take an extra Lisinopril if your blood pressure is over 140/90 and you are having chest pain

## 2010-09-24 NOTE — Progress Notes (Signed)
[name] Date of Birth: 1921/04/16   History of Present Illness: Ms. Knoop is seen back today for her post hospital visit. She is seen for Dr. Elease Hashimoto. She was hospitalized back in late August with NSTEMI. She had a cath which showed normal EF and normal coronaries. CT angio was negative for a PE. Echo showed diastolic dysfunction with a normal EF. She had a trivial pericardial effusion and mild MR. Her elevated enzymes and chest pain were felt to be related to high blood pressure. She notes that it was up to 190/90 before going to the hospital. She was placed on metoprolol and Lisinopril at discharge. She has done ok since discharge. She did have one similar episode early last week. Blood pressure was up again. She has had no recurrence. She is not dizzy or lightheaded. She has had some bruising of her groin that is resolving.  Current Outpatient Prescriptions on File Prior to Visit  Medication Sig Dispense Refill  . Albuterol (VENTOLIN IN) Inhale into the lungs.        Marland Kitchen amLODipine (NORVASC) 10 MG tablet Take 10 mg by mouth daily. 1/2 tablet daily       . aspirin 81 MG tablet Take 81 mg by mouth daily.        . citalopram (CELEXA) 40 MG tablet Take 40 mg by mouth daily. 1/2 tablet daily      . estradiol (VIVELLE-DOT) 0.05 MG/24HR Place 1 patch (0.05 mg total) onto the skin once a week.  8 patch  1  . fluticasone (FLONASE) 50 MCG/ACT nasal spray Place 2 sprays into the nose daily. As needed      . levothyroxine (SYNTHROID, LEVOTHROID) 50 MCG tablet Take 50 mcg by mouth daily.        Marland Kitchen lisinopril (PRINIVIL,ZESTRIL) 2.5 MG tablet Take 2.5 mg by mouth daily.        . metoprolol succinate (TOPROL-XL) 25 MG 24 hr tablet Take 25 mg by mouth daily. 1/2 tablet po daily       . montelukast (SINGULAIR) 10 MG tablet Take 10 mg by mouth at bedtime.        . pregabalin (LYRICA) 50 MG capsule Take 50 mg by mouth 2 (two) times daily.         Allergies  Allergen Reactions  . Hydrocodone    Passed out, threw up, blood pressure dropped  . Clarithromycin   . Levofloxacin     Past Medical History  Diagnosis Date  . Bronchiectasis   . Idiopathic peripheral neuropathy   . Pollen allergies   . Asthma   . Hypertension   . Allergic rhinitis   . NSTEMI (non-ST elevated myocardial infarction) August 2012    Normal coronaries per cath August 2012 and negative CT angio for PE  . S/P cardiac cath August 2012    Normal     Past Surgical History  Procedure Date  . Dilation and curettage of uterus   . Total abdominal hysterectomy w/ bilateral salpingoophorectomy 1972    leiomyomata, menorrhagia  . Appendectomy   . Cardiac catheterization August 2012    History  Smoking status  . Never Smoker   Smokeless tobacco  . Never Used    History  Alcohol Use  . Yes    wine 1-2 week    Family History  Problem Relation Age of Onset  . Hypertension Mother     stroke  . Cancer Brother     prostrate  Review of Systems: The review of systems is positive for one episode of recurrent chest pain. It resolved on its own.  All other systems were reviewed and are negative.  Physical Exam: BP 118/82  Pulse 62  Ht 5\' 7"  (1.702 m)  Wt 142 lb 3.2 oz (64.501 kg)  BMI 22.27 kg/m2  LMP 01/14/1970 Repeat blood pressures by me are grossly elevated at 180/90 and 160/90.  Patient is very pleasant and in no acute distress. She looks younger than her stated age. Skin is warm and dry. Color is normal.  HEENT is unremarkable. Normocephalic/atraumatic. PERRL. Sclera are nonicteric. Neck is supple. No masses. No JVD. Lungs are clear. Cardiac exam shows a regular rate and rhythm. Abdomen is soft. Extremities are without edema. Right groin shows some resolving ecchymosis. Gait and ROM are intact. No gross neurologic deficits noted.  LABORATORY DATA: BMET is pending.   Assessment / Plan:

## 2010-09-24 NOTE — Assessment & Plan Note (Signed)
She has normal coronaries per her cath. No PE per CT angio. Blood pressure remains very high by my exam. I have increased her metoprolol to a whole tablet. She may use extra Lisinopril for recurrent chest pain and elevated blood pressure above 140/90. I will see her back in a month. She is to monitor her blood pressures at home and bring me in a diary to review.  Patient is agreeable to this plan and will call if any problems develop in the interim.

## 2010-09-25 LAB — BASIC METABOLIC PANEL
BUN: 21 mg/dL (ref 6–23)
CO2: 26 mEq/L (ref 19–32)
Calcium: 8.7 mg/dL (ref 8.4–10.5)
Chloride: 106 mEq/L (ref 96–112)
Creatinine, Ser: 1 mg/dL (ref 0.4–1.2)
GFR: 56.97 mL/min — ABNORMAL LOW (ref >60.00)
Glucose, Bld: 87 mg/dL (ref 70–99)
Potassium: 4.4 mEq/L (ref 3.5–5.1)
Sodium: 139 mEq/L (ref 135–145)

## 2010-09-27 ENCOUNTER — Telehealth: Payer: Self-pay | Admitting: *Deleted

## 2010-09-27 NOTE — Telephone Encounter (Signed)
Normal lab msg left and call with any further questions or concerns.

## 2010-09-27 NOTE — Telephone Encounter (Signed)
Message copied by Antony Odea on Thu Sep 27, 2010 11:08 AM ------      Message from: Rosalio Macadamia      Created: Tue Sep 25, 2010 11:07 AM       Ok to report. Labs are satisfactory. Patient of Dr. Harvie Bridge

## 2010-10-16 ENCOUNTER — Encounter: Payer: Self-pay | Admitting: Emergency Medicine

## 2010-10-16 ENCOUNTER — Ambulatory Visit (INDEPENDENT_AMBULATORY_CARE_PROVIDER_SITE_OTHER): Payer: Medicare Other | Admitting: Emergency Medicine

## 2010-10-16 DIAGNOSIS — R05 Cough: Secondary | ICD-10-CM

## 2010-10-16 DIAGNOSIS — J45909 Unspecified asthma, uncomplicated: Secondary | ICD-10-CM

## 2010-10-16 DIAGNOSIS — J479 Bronchiectasis, uncomplicated: Secondary | ICD-10-CM

## 2010-10-16 NOTE — Assessment & Plan Note (Signed)
New dry cough superimposed on her chronic bronchiectasis - likely due to the lisinopril - continue lisinopril for now; I'd like Shawn Route ahd Dr Elease Hashimoto to weigh risks/benefits of this med given the clean cath.

## 2010-10-16 NOTE — Progress Notes (Signed)
77 yo never smoker, hx remote Legionella PNA, diagnosed with suspected asthma about 10 years ago. PFT's 7/10 with mixed disease, mild AFL. Formerly treated with Advair and albuterol, stopped when she moved to [place] in 2008. Tolerated the change well. Stayed on Singulair. At original consult we d/c'd ACE-I,   ROV 09/14/08 -- Since last visit her cough got a little worse, productive of some small plugs, usually dark rarely with some blood-tinged mucous. PND is gone now that she is on an allergy regimen. Using ventolin rarely.   ROV 11/15/08 -- Returns after having sputum studies last visit - negative for AFB, fungal, bacterial. Developed viral URI symptoms about 10/20 - started to have some breathing pain, productive cough, yellow phlegm. Started on Augmentin, her cough and sputum improved. Didn't have to increase ventolin use. Still coughing frequently. Not using flutter on a schedule.   ROB 02/28/09 -- has been doing well since last time. Was treated with azithro in December after a URI, cough improved. Has albuterol available as needed. Was started on flovent with recent illness, but not taking now. Uses albuterol every couple days. Ran out of fexafenodine recently.   ROV 08/23/09 -- regular f/u for bronchiectasis. She is feling very well, the best she's been in over a year. Was treated for one episode of bronchitis in May treated with doxy. Hasn't been taking her fexofenadine, is on singulair. Taking flonase. Occas has used ventolin, about once a week.   ROV 03/15/10 -- returns for her bronchiectasis, mild AFL. She had a URI in Dec '11, was treated for bronchitis. Now feeling better. Using Ventolin qod. Going to the gym 2x a week. Coughs up dark plugs. Using flutter every other day.   ROV 08/24/10 --  bronchiectasis, mild AFL. Regular f/u. Coughing less, occasionally some dark sputum. CXR done July showed some scar and ? Nodularity. In general she is feeling well. No abx since last visit. Has not been needing  flutter valve every day.   ROV 10/16/10 -- follow up for her bronchiectasis, mild AFL. Unfortunately since last visit she has an MI, managed by Dr Elease Hashimoto, hospitalized in 8/12, coronaries were clean. She was restarted on lisinopril (low dose), has restarted the dry cough. Also has the other stable cough that produces plugs, stable.     Gen: Pleasant, well-nourished, in no distress,  normal affect  ENT: No lesions,  mouth clear,  oropharynx clear, no postnasal drip  Neck: No JVD, no TMG, no carotid bruits  Lungs: No use of accessory muscles, no dullness to percussion, few scattered insp crackles R>L  Cardiovascular: RRR, heart sounds normal, no murmur or gallops, no peripheral edema  Musculoskeletal: No deformities, no cyanosis or clubbing  Neuro: alert, non focal  Skin: Warm, no lesions or rashes  BRONCHIECTASIS Stable sputum production  ASTHMA Ventolin prn  COUGH New dry cough superimposed on her chronic bronchiectasis - likely due to the lisinopril - continue lisinopril for now; I'd like Shawn Route ahd Dr Elease Hashimoto to weigh risks/benefits of this med given the clean cath.

## 2010-10-16 NOTE — Assessment & Plan Note (Signed)
Ventolin prn 

## 2010-10-16 NOTE — Patient Instructions (Signed)
We will not change your medications at this time.  Follow up with Dr Delton Coombes in 6 months or sooner if you have any problems.

## 2010-10-16 NOTE — Assessment & Plan Note (Signed)
Stable sputum production

## 2010-10-22 ENCOUNTER — Encounter: Payer: Self-pay | Admitting: Nurse Practitioner

## 2010-10-22 ENCOUNTER — Ambulatory Visit (INDEPENDENT_AMBULATORY_CARE_PROVIDER_SITE_OTHER): Payer: Medicare Other | Admitting: Nurse Practitioner

## 2010-10-22 DIAGNOSIS — I214 Non-ST elevation (NSTEMI) myocardial infarction: Secondary | ICD-10-CM

## 2010-10-22 DIAGNOSIS — I10 Essential (primary) hypertension: Secondary | ICD-10-CM

## 2010-10-22 NOTE — Assessment & Plan Note (Signed)
Her diary from home is reviewed and looks good for the most part. She will continue to use extra Lisinopril if needed.

## 2010-10-22 NOTE — Assessment & Plan Note (Signed)
Coronaries were normal per cath. CT was negative for PE. Blood pressure has improved with her current regimen. She is doing well overall. I have given her the ok to resume her activities at the gym. I will have her see Dr. Elease Hashimoto in about 6 weeks. Patient is agreeable to this plan and will call if any problems develop in the interim.

## 2010-10-22 NOTE — Progress Notes (Signed)
Sarah [name] Date of Birth: 1921/11/24   History of Present Illness: Sarah [name] is seen back today for a one month check. She is seen for Dr. Elease Hashimoto. She had a NSTEMI back in August. Her coronaries are normal. EF was normal. She has some diastolic dysfunction. This episode was felt to be due to elevated blood pressure. Metoprolol was increased at her last visit. She has done well over the past one month. She has had only 2 "little" episodes of discomfort. Her blood pressure was almost 170 during both spells. She took extra Lisinopril as advised and got relief. She is anxious to get back to the gym. She usually works out with a Systems analyst two times a week. She has no real complaint.   Current Outpatient Prescriptions on File Prior to Visit  Medication Sig Dispense Refill  . Albuterol (VENTOLIN IN) Inhale into the lungs.        Marland Kitchen amLODipine (NORVASC) 10 MG tablet Take 10 mg by mouth daily. 1/2 tablet daily       . aspirin 81 MG tablet Take 81 mg by mouth daily.        . citalopram (CELEXA) 40 MG tablet Take 40 mg by mouth daily. 1/2 tablet daily      . estradiol (VIVELLE-DOT) 0.05 MG/24HR Place 1 patch (0.05 mg total) onto the skin once a week.  8 patch  1  . fluticasone (FLONASE) 50 MCG/ACT nasal spray Place 2 sprays into the nose daily. As needed      . levothyroxine (SYNTHROID, LEVOTHROID) 50 MCG tablet Take 50 mcg by mouth daily.        Marland Kitchen lisinopril (PRINIVIL,ZESTRIL) 2.5 MG tablet Take 2.5 mg by mouth daily.        . metoprolol succinate (TOPROL-XL) 25 MG 24 hr tablet Whole tablet daily      . montelukast (SINGULAIR) 10 MG tablet Take 10 mg by mouth at bedtime.        . pregabalin (LYRICA) 50 MG capsule Take 50 mg by mouth 2 (two) times daily.         Allergies  Allergen Reactions  . Hydrocodone     Passed out, threw up, blood pressure dropped  . Clarithromycin   . Levofloxacin     Past Medical History  Diagnosis Date  . Bronchiectasis   . Idiopathic peripheral  neuropathy   . Pollen allergies   . Asthma   . Hypertension   . Allergic rhinitis   . NSTEMI (non-ST elevated myocardial infarction) August 2012    Normal coronaries per cath August 2012 and negative CT angio for PE  . S/P cardiac cath August 2012    Normal   . Diastolic dysfunction August 2012    per echo with normal LV function    Past Surgical History  Procedure Date  . Dilation and curettage of uterus   . Total abdominal hysterectomy w/ bilateral salpingoophorectomy 1972    leiomyomata, menorrhagia  . Appendectomy   . Cardiac catheterization August 2012    History  Smoking status  . Never Smoker   Smokeless tobacco  . Never Used    History  Alcohol Use  . Yes    wine 1-2 week    Family History  Problem Relation Age of Onset  . Hypertension Mother     stroke  . Cancer Brother     prostrate    Review of Systems: The review of systems is per the HPI.  All other  systems were reviewed and are negative.  Physical Exam: BP 118/72  Pulse 58  Ht 5\' 7"  (1.702 m)  Wt 138 lb 1.9 oz (62.651 kg)  BMI 21.63 kg/m2  LMP 01/14/1970 Patient is very pleasant and in no acute distress. She looks younger than her stated age. Skin is warm and dry. Color is normal.  HEENT is unremarkable. Normocephalic/atraumatic. PERRL. Sclera are nonicteric. Neck is supple. No masses. No JVD. Lungs are clear. Cardiac exam shows a regular rate and rhythm. Abdomen is soft. Extremities are without edema. Gait and ROM are intact. No gross neurologic deficits noted.  LABORATORY DATA:   Assessment / Plan:

## 2010-10-22 NOTE — Patient Instructions (Signed)
May resume your exercise program with the personal trainer 2 times a week. Start back slow and gradually get back in your routine.  We will see you in 6 weeks.  Continue to monitor your blood pressure. You may continue to use the Lisinopril as needed.   OK to get handicap sticker.

## 2010-12-03 ENCOUNTER — Ambulatory Visit (INDEPENDENT_AMBULATORY_CARE_PROVIDER_SITE_OTHER): Payer: Medicare Other | Admitting: Cardiovascular Disease

## 2010-12-03 ENCOUNTER — Encounter: Payer: Self-pay | Admitting: Cardiovascular Disease

## 2010-12-03 VITALS — BP 144/84 | HR 68 | Ht 67.5 in | Wt 140.0 lb

## 2010-12-03 DIAGNOSIS — I10 Essential (primary) hypertension: Secondary | ICD-10-CM

## 2010-12-03 DIAGNOSIS — I214 Non-ST elevation (NSTEMI) myocardial infarction: Secondary | ICD-10-CM

## 2010-12-03 MED ORDER — LISINOPRIL 5 MG PO TABS: 5.0000 mg | ORAL_TABLET | Freq: Every day | ORAL | Status: DC

## 2010-12-03 NOTE — Patient Instructions (Signed)
Your physician recommends that you schedule a follow-up appointment in: 3 months/ app made  Your physician recommends that you return for a FASTING lipid profile: 3 months  Your physician has recommended you make the following change in your medication:  1) increase lisinopril to 5 mg daily

## 2010-12-03 NOTE — Assessment & Plan Note (Signed)
Pt is doing well.  I suspect her NSTEMI was caused by her HTN.   She has not had any further episodes of CP

## 2010-12-03 NOTE — Progress Notes (Signed)
Sarah [name] Date of Birth  [birth year] [facility] [address]    [address] [place], [place]  [address] [phone]  Fax  [phone]  History of Present Illness:  Sarah [name] is a 77 y.o. female with a Hx of HTN and a hospitalization in August, 2012 for BP elevation and NSTEMI.  CT angio was negative for PE.  She has a hx of Legionaires Disease and has had bronchiectasis with chronic rales / rhonchi since that time.  Current Outpatient Prescriptions on File Prior to Visit  Medication Sig Dispense Refill  . Albuterol (VENTOLIN IN) Inhale into the lungs.        Marland Kitchen amLODipine (NORVASC) 10 MG tablet Take 10 mg by mouth daily. 1/2 tablet daily       . aspirin 81 MG tablet Take 81 mg by mouth daily.        . citalopram (CELEXA) 40 MG tablet Take 40 mg by mouth daily. 1/2 tablet daily      . estradiol (VIVELLE-DOT) 0.05 MG/24HR Place 1 patch (0.05 mg total) onto the skin once a week.  8 patch  1  . fluticasone (FLONASE) 50 MCG/ACT nasal spray Place 2 sprays into the nose daily. As needed      . levothyroxine (SYNTHROID, LEVOTHROID) 50 MCG tablet Take 50 mcg by mouth daily.        Marland Kitchen lisinopril (PRINIVIL,ZESTRIL) 2.5 MG tablet Take 2.5 mg by mouth daily.        . metoprolol succinate (TOPROL-XL) 25 MG 24 hr tablet Whole tablet daily      . montelukast (SINGULAIR) 10 MG tablet Take 10 mg by mouth at bedtime.        . pregabalin (LYRICA) 50 MG capsule Take 50 mg by mouth 2 (two) times daily.         Allergies  Allergen Reactions  . Hydrocodone     Passed out, threw up, blood pressure dropped  . Clarithromycin   . Levofloxacin     Past Medical History  Diagnosis Date  . Bronchiectasis   . Idiopathic peripheral neuropathy   . Pollen allergies   . Asthma   . Hypertension   . Allergic rhinitis   . NSTEMI (non-ST elevated myocardial infarction) August 2012    Normal coronaries per cath August 2012 and negative CT angio for PE  . S/P cardiac cath August 2012    Normal   .  Diastolic dysfunction August 2012    per echo with normal LV function    Past Surgical History  Procedure Date  . Dilation and curettage of uterus   . Total abdominal hysterectomy w/ bilateral salpingoophorectomy 1972    leiomyomata, menorrhagia  . Appendectomy   . Cardiac catheterization August 2012    History  Smoking status  . Never Smoker   Smokeless tobacco  . Never Used    History  Alcohol Use  . Yes    wine 1-2 week    Family History  Problem Relation Age of Onset  . Hypertension Mother     stroke  . Cancer Brother     prostrate    Reviw of Systems:  Reviewed in the HPI.  All other systems are negative.  Physical Exam: BP 144/84  Pulse 68  Ht 5' 7.5" (1.715 m)  Wt 140 lb (63.504 kg)  BMI 21.60 kg/m2  LMP 01/14/1970 The patient is alert and oriented x 3.  The mood and affect are normal.   Skin: warm and dry.  Color is normal.    HEENT:   [place]. Normal carotids, no JVD  Lungs: bilateral rales / rhonchi   Heart: RR.    Abdomen: + BS, non tender  Extremities:  No c/c/e  Neuro:  Non focal     Assessment / Plan:

## 2010-12-03 NOTE — Assessment & Plan Note (Signed)
Sarah [name] seems to be doing fairly well.  She's not had any further episodes of chest pain. She continues to have mildly elevated blood pressure. We will increase her lisinopril slightly to 5  mg a day. I'll see her again in 3 months.

## 2010-12-13 ENCOUNTER — Telehealth: Payer: Self-pay | Admitting: Cardiovascular Disease

## 2010-12-13 ENCOUNTER — Encounter: Payer: Self-pay | Admitting: Nurse Practitioner

## 2010-12-13 ENCOUNTER — Ambulatory Visit (INDEPENDENT_AMBULATORY_CARE_PROVIDER_SITE_OTHER): Payer: Medicare Other | Admitting: Nurse Practitioner

## 2010-12-13 VITALS — BP 88/52 | HR 59 | Ht 67.0 in | Wt 139.1 lb

## 2010-12-13 DIAGNOSIS — I214 Non-ST elevation (NSTEMI) myocardial infarction: Secondary | ICD-10-CM

## 2010-12-13 DIAGNOSIS — I10 Essential (primary) hypertension: Secondary | ICD-10-CM

## 2010-12-13 MED ORDER — METOPROLOL TARTRATE 25 MG PO TABS: 12.5000 mg | ORAL_TABLET | Freq: Two times a day (BID) | ORAL | Status: DC

## 2010-12-13 MED ORDER — LISINOPRIL 5 MG PO TABS: 2.5000 mg | ORAL_TABLET | Freq: Two times a day (BID) | ORAL | Status: DC

## 2010-12-13 MED ORDER — AMLODIPINE BESYLATE 5 MG PO TABS: 2.5000 mg | ORAL_TABLET | Freq: Two times a day (BID) | ORAL | Status: DC

## 2010-12-13 NOTE — Assessment & Plan Note (Signed)
Has normal coronaries with normal EF at the time of cath.

## 2010-12-13 NOTE — Progress Notes (Signed)
[name] Date of Birth: 10/03/1921 Medical Record #[id number]  History of Present Illness: Ms. Schnetzer is seen today for a follow up visit. She is seen for Dr. Elease Hashimoto. She has had prior NSTEMI back in August. Her coronaries were normal. EF was normal. This was felt to be related to her HTN. She has been managed medically. She was just seen about 10 days ago and was doing ok.   Family calls this morning to report that last Saturday and last night, she had elevated blood pressure and elevated heart rate with associated chest tightness. She had actually missed her nighttime dose of Norvasc last Friday night. By 6 am in the morning her blood pressure was 190/100. Last night she had a spell around 8 pm. She had not missed her medicines that time. She feels just horrible during these episodes with chest pressure and shortness of breath and heart pounding.   Her blood pressure list from home is reviewed. Except for these 2 incidents, her blood pressure has actually been on the low side of normal.   Current Outpatient Prescriptions on File Prior to Visit  Medication Sig Dispense Refill  . acetaminophen-codeine (TYLENOL #3) 300-30 MG per tablet as needed.      . Albuterol (VENTOLIN IN) Inhale into the lungs.        Marland Kitchen amLODipine (NORVASC) 10 MG tablet Take 10 mg by mouth daily. 1/2 tablet daily       . aspirin 81 MG tablet Take 81 mg by mouth daily.        . citalopram (CELEXA) 40 MG tablet Take 40 mg by mouth daily. 1/2 tablet daily      . estradiol (VIVELLE-DOT) 0.05 MG/24HR Place 1 patch (0.05 mg total) onto the skin once a week.  8 patch  1  . fluticasone (FLONASE) 50 MCG/ACT nasal spray Place 2 sprays into the nose daily. As needed      . levothyroxine (SYNTHROID, LEVOTHROID) 50 MCG tablet Take 50 mcg by mouth daily.        Marland Kitchen lisinopril (PRINIVIL,ZESTRIL) 5 MG tablet Take 1 tablet (5 mg total) by mouth daily.  30 tablet  5  . metoprolol succinate (TOPROL-XL) 25 MG 24 hr tablet Whole tablet  daily      . montelukast (SINGULAIR) 10 MG tablet Take 10 mg by mouth at bedtime.        . pregabalin (LYRICA) 50 MG capsule Take 50 mg by mouth 2 (two) times daily.         Allergies  Allergen Reactions  . Hydrocodone     Passed out, threw up, blood pressure dropped  . Clarithromycin   . Levofloxacin     Past Medical History  Diagnosis Date  . Bronchiectasis   . Idiopathic peripheral neuropathy   . Pollen allergies   . Asthma   . Hypertension   . Allergic rhinitis   . NSTEMI (non-ST elevated myocardial infarction) August 2012    Normal coronaries per cath August 2012 and negative CT angio for PE  . S/P cardiac cath August 2012    Normal   . Diastolic dysfunction August 2012    per echo with normal LV function    Past Surgical History  Procedure Date  . Dilation and curettage of uterus   . Total abdominal hysterectomy w/ bilateral salpingoophorectomy 1972    leiomyomata, menorrhagia  . Appendectomy   . Cardiac catheterization August 2012    History  Smoking status  . Never Smoker  Smokeless tobacco  . Never Used    History  Alcohol Use  . Yes    wine 1-2 week    Family History  Problem Relation Age of Onset  . Hypertension Mother     stroke  . Cancer Brother     prostrate    Review of Systems: The review of systems is per the HPI.  All other systems were reviewed and are negative.  Physical Exam: BP 100/64  Pulse 59  Ht 5\' 7"  (1.702 m)  Wt 139 lb 1.9 oz (63.104 kg)  BMI 21.79 kg/m2  LMP 01/14/1970 Patient is very pleasant and in no acute distress. Skin is warm and dry. Color is normal.  HEENT is unremarkable. Normocephalic/atraumatic. PERRL. Sclera are nonicteric. Neck is supple. No masses. No JVD. Lungs show diffuse rales and rhonchi. Cardiac exam shows a regular rate and rhythm. Abdomen is soft. Extremities are without edema. Gait and ROM are intact. No gross neurologic deficits noted.   LABORATORY DATA: EKG shows sinus brady.     Assessment / Plan:

## 2010-12-13 NOTE — Telephone Encounter (Signed)
Offered app today with Lori/ accepted.

## 2010-12-13 NOTE — Patient Instructions (Addendum)
We are going to get a doppler study of the arteries to your kidneys.   On your medicines - Amlodipine is going to be a 5 mg tablet taking 1/2 two times a day.  Go back metoprolol tartrate 1/2 two times a day  Take the Lisinopril 1/2 two times a day.  We will see you in 2 weeks on a day that Dr. Elease Hashimoto is here in the office with me.  Continue to monitor your blood pressure at home.  Call the [facility] office at [phone] if you have any questions, problems or concerns.

## 2010-12-13 NOTE — Assessment & Plan Note (Signed)
She has had marked spike in her blood pressure and then documented low normal readings. I think we need to be concerned about RAS. I did not hear any abdominal bruits on my exam. We are going to split up her medicines. She brought all of her bottles today and is actually taking metoprolol tartrate only once a day and we have thought she was on succinate.  Her Norvasc is going to 5 mg taking 1/2 BID. She will take her Lisinopril 5 mg 1/2 BID and use her metoprolol tartrate 25 mg 1/2 BID. We will check renal duplex. I will see her back along with Dr. Elease Hashimoto in 2 weeks to recheck. She is to continue to monitor her blood pressure at home. Patient is agreeable to this plan and will call if any problems develop in the interim.

## 2010-12-13 NOTE — Telephone Encounter (Signed)
New message:  On Saturday and last night she had elevated blood pressure and elevated heart rate with chest tightness.  She is taking her medication as prescribed.  Family would like for her to be seen.  This seems to always happens in the evening. Made her an appt for tomorrow just to hold time slot.

## 2010-12-14 ENCOUNTER — Ambulatory Visit: Payer: Medicare Other | Admitting: Cardiovascular Disease

## 2010-12-19 ENCOUNTER — Encounter (INDEPENDENT_AMBULATORY_CARE_PROVIDER_SITE_OTHER): Payer: Medicare Other | Admitting: Cardiology

## 2010-12-19 DIAGNOSIS — I10 Essential (primary) hypertension: Secondary | ICD-10-CM

## 2010-12-26 ENCOUNTER — Other Ambulatory Visit: Payer: Self-pay | Admitting: *Deleted

## 2010-12-26 DIAGNOSIS — N951 Menopausal and female climacteric states: Secondary | ICD-10-CM

## 2010-12-26 MED ORDER — ESTRADIOL 0.05 MG/24HR TD PTTW: 1.0000 | MEDICATED_PATCH | TRANSDERMAL | Status: DC

## 2010-12-27 ENCOUNTER — Encounter: Payer: Self-pay | Admitting: Nurse Practitioner

## 2010-12-27 ENCOUNTER — Ambulatory Visit (INDEPENDENT_AMBULATORY_CARE_PROVIDER_SITE_OTHER): Payer: Medicare Other | Admitting: Nurse Practitioner

## 2010-12-27 VITALS — BP 132/74 | HR 66 | Ht 67.5 in | Wt 140.0 lb

## 2010-12-27 DIAGNOSIS — I10 Essential (primary) hypertension: Secondary | ICD-10-CM

## 2010-12-27 NOTE — Patient Instructions (Signed)
Lets stay on your current medicines.  We will see you back in February as scheduled. If you have trouble before then call the office and talk to Dr. Harvie Bridge nurse "Alvino Chapel"  Call the [facility] office at [phone] if you have any questions, problems or concerns.

## 2010-12-27 NOTE — Progress Notes (Signed)
Sarah [name] Date of Birth: 09-23-1921 Medical Record #[id number]  History of Present Illness: Sarah [name] is seen back today for a 2 week check. She is seen for Dr. Elease Hashimoto. She has had prior NSTEMI back in August. She has normal coronaries. EF is normal. This was felt to be related to her HTN. She has been managed medically. She has had recurrent episodes of blood pressure spiking associated with chest tightness. We have adjusted her medicines. She comes back today for follow up. She notes that her blood pressure has done much better. Her renal duplex was normal. She had an episode earlier this morning where she woke with a headache. Blood pressure was fine. She tries to remain active. Went shopping yesterday.   Current Outpatient Prescriptions on File Prior to Visit  Medication Sig Dispense Refill  . acetaminophen-codeine (TYLENOL #3) 300-30 MG per tablet as needed.      . Albuterol (VENTOLIN IN) Inhale into the lungs.        Marland Kitchen amLODipine (NORVASC) 5 MG tablet Take 0.5 tablets (2.5 mg total) by mouth 2 (two) times daily.  30 tablet  11  . aspirin 81 MG tablet Take 81 mg by mouth daily.        . citalopram (CELEXA) 40 MG tablet Take 40 mg by mouth daily. 1/2 tablet daily      . estradiol (VIVELLE-DOT) 0.05 MG/24HR Place 1 patch (0.05 mg total) onto the skin once a week.  8 patch  0  . fluticasone (FLONASE) 50 MCG/ACT nasal spray Place 2 sprays into the nose daily. As needed      . levothyroxine (SYNTHROID, LEVOTHROID) 50 MCG tablet Take 50 mcg by mouth daily.        Marland Kitchen lisinopril (PRINIVIL,ZESTRIL) 5 MG tablet Take 0.5 tablets (2.5 mg total) by mouth 2 (two) times daily.  30 tablet  5  . metoprolol tartrate (LOPRESSOR) 25 MG tablet Take 0.5 tablets (12.5 mg total) by mouth 2 (two) times daily.  60 tablet  11  . montelukast (SINGULAIR) 10 MG tablet Take 10 mg by mouth at bedtime.        . pregabalin (LYRICA) 50 MG capsule Take 50 mg by mouth 2 (two) times daily.         Allergies  Allergen  Reactions  . Hydrocodone     Passed out, threw up, blood pressure dropped  . Clarithromycin   . Levofloxacin     Past Medical History  Diagnosis Date  . Bronchiectasis     with history of Legionaires with chronic rales/rhonchi  . Idiopathic peripheral neuropathy   . Pollen allergies   . Asthma   . Hypertension     Negative renal duplex in December of 2012  . Allergic rhinitis   . NSTEMI (non-ST elevated myocardial infarction) August 2012    Normal coronaries per cath August 2012 and negative CT angio for PE  . S/P cardiac cath August 2012    Normal   . Diastolic dysfunction August 2012    per echo with normal LV function    Past Surgical History  Procedure Date  . Dilation and curettage of uterus   . Total abdominal hysterectomy w/ bilateral salpingoophorectomy 1972    leiomyomata, menorrhagia  . Appendectomy   . Cardiac catheterization August 2012    History  Smoking status  . Never Smoker   Smokeless tobacco  . Never Used    History  Alcohol Use  . Yes    wine 1-2  week    Family History  Problem Relation Age of Onset  . Hypertension Mother     stroke  . Cancer Brother     prostrate    Review of Systems: The review of systems is positive for fatigue.  All other systems were reviewed and are negative.  Physical Exam: BP 132/74  Pulse 66  Ht 5' 7.5" (1.715 m)  Wt 140 lb (63.504 kg)  BMI 21.60 kg/m2  LMP 01/14/1970 Patient is very pleasant and in no acute distress. Skin is warm and dry. Color is normal.  HEENT is unremarkable. Normocephalic/atraumatic. PERRL. Sclera are nonicteric. Neck is supple. No masses. No JVD. Lungs have chronic rales and rhonchi. Cardiac exam shows a regular rate and rhythm. Abdomen is soft. Extremities are without edema. Gait and ROM are intact. No gross neurologic deficits noted.   LABORATORY DATA:   Assessment / Plan:

## 2010-12-27 NOTE — Assessment & Plan Note (Signed)
Her blood pressure is much better with how we have her medicines adjusted. We will give her some time. She has an appointment in February with Dr. Elease Hashimoto. No other medicines are added or taken away today. She will let us know if she has current problems. Her duplex was normal. Patient is agreeable to this plan and will call if any problems develop in the interim.

## 2011-01-16 ENCOUNTER — Other Ambulatory Visit: Payer: Self-pay | Admitting: *Deleted

## 2011-01-16 DIAGNOSIS — I214 Non-ST elevation (NSTEMI) myocardial infarction: Secondary | ICD-10-CM

## 2011-01-16 MED ORDER — LISINOPRIL 5 MG PO TABS: 2.5000 mg | ORAL_TABLET | Freq: Two times a day (BID) | ORAL | Status: DC

## 2011-01-16 NOTE — Telephone Encounter (Signed)
Fax Received. Refill Completed. Sarah [name] (R.M.A)   

## 2011-01-22 ENCOUNTER — Other Ambulatory Visit: Payer: Self-pay | Admitting: Gynecology

## 2011-01-22 ENCOUNTER — Other Ambulatory Visit: Payer: Self-pay | Admitting: *Deleted

## 2011-01-22 DIAGNOSIS — N951 Menopausal and female climacteric states: Secondary | ICD-10-CM

## 2011-01-22 MED ORDER — ESTRADIOL 0.05 MG/24HR TD PTTW: 1.0000 | MEDICATED_PATCH | TRANSDERMAL | Status: DC

## 2011-01-22 NOTE — Telephone Encounter (Signed)
Called patient and she said she is doing well on the patch.  She said she has "gotten her life back".  Asks you to authorize refill.

## 2011-02-08 ENCOUNTER — Ambulatory Visit: Payer: Medicare Other | Admitting: Nurse Practitioner

## 2011-02-11 ENCOUNTER — Encounter: Payer: Self-pay | Admitting: Nurse Practitioner

## 2011-02-11 ENCOUNTER — Ambulatory Visit (INDEPENDENT_AMBULATORY_CARE_PROVIDER_SITE_OTHER): Payer: Medicare Other | Admitting: Nurse Practitioner

## 2011-02-11 VITALS — BP 102/68 | HR 68 | Ht 67.0 in | Wt 140.0 lb

## 2011-02-11 DIAGNOSIS — R000 Tachycardia, unspecified: Secondary | ICD-10-CM

## 2011-02-11 DIAGNOSIS — I10 Essential (primary) hypertension: Secondary | ICD-10-CM

## 2011-02-11 NOTE — Patient Instructions (Signed)
We are going to put a heart monitor on you for the next month.  We will see you as scheduled in February, sooner if the monitor shows something.  Call the [facility] office at [phone] if you have any questions, problems or concerns.

## 2011-02-11 NOTE — Progress Notes (Signed)
Sarah [name] Date of Birth: 01-12-1922 Medical Record #[id number]  History of Present Illness: Sarah [name] is seen today for a work in appointment. She is seen for Dr. Elease Hashimoto. She was to see him in February but she called for an early appointment. She continues to have these episodes of "distress". Her blood pressure is elevated during these events, but not severe (i.e., 149/88, 145/75 and 152/82). She notes that the last 2 spells were last week. Both woke her up at night. She felt her heart racing and pounding, followed by chest pain and shortness of breath. She takes extra Lisinopril to get her blood pressure down. She does note that the day before the spells, she will have excessive urination. Her blood pressure diary has otherwise been very benign with great readings.   She has otherwise been doing good. She had NSTEMI back in August with normal coronaries. The event was felt to be related to her blood pressure.   Current Outpatient Prescriptions on File Prior to Visit  Medication Sig Dispense Refill  . acetaminophen-codeine (TYLENOL #3) 300-30 MG per tablet as needed.      . Albuterol (VENTOLIN IN) Inhale into the lungs.        Marland Kitchen amLODipine (NORVASC) 5 MG tablet Take 0.5 tablets (2.5 mg total) by mouth 2 (two) times daily.  30 tablet  11  . aspirin 81 MG tablet Take 81 mg by mouth daily.        . citalopram (CELEXA) 40 MG tablet Take 40 mg by mouth daily. 1/2 tablet daily      . estradiol (VIVELLE-DOT) 0.05 MG/24HR Place 1 patch (0.05 mg total) onto the skin once a week.  8 patch  8  . fluticasone (FLONASE) 50 MCG/ACT nasal spray Place 2 sprays into the nose daily. As needed      . levothyroxine (SYNTHROID, LEVOTHROID) 50 MCG tablet Take 50 mcg by mouth daily.        Marland Kitchen lisinopril (PRINIVIL,ZESTRIL) 5 MG tablet Take 0.5 tablets (2.5 mg total) by mouth 2 (two) times daily.  60 tablet  5  . metoprolol tartrate (LOPRESSOR) 25 MG tablet Take 0.5 tablets (12.5 mg total) by mouth 2 (two) times  daily.  60 tablet  11  . montelukast (SINGULAIR) 10 MG tablet Take 10 mg by mouth at bedtime.        . pregabalin (LYRICA) 50 MG capsule Take 50 mg by mouth 2 (two) times daily.         Allergies  Allergen Reactions  . Hydrocodone     Passed out, threw up, blood pressure dropped  . Clarithromycin   . Levofloxacin     Past Medical History  Diagnosis Date  . Bronchiectasis     with history of Legionaires with chronic rales/rhonchi  . Idiopathic peripheral neuropathy   . Pollen allergies   . Asthma   . Hypertension     Negative renal duplex in December of 2012  . Allergic rhinitis   . NSTEMI (non-ST elevated myocardial infarction) August 2012    Normal coronaries per cath August 2012 and negative CT angio for PE  . S/P cardiac cath August 2012    Normal   . Diastolic dysfunction August 2012    per echo with normal LV function    Past Surgical History  Procedure Date  . Dilation and curettage of uterus   . Total abdominal hysterectomy w/ bilateral salpingoophorectomy 1972    leiomyomata, menorrhagia  . Appendectomy   .  Cardiac catheterization August 2012    Normal coronaries.    History  Smoking status  . Never Smoker   Smokeless tobacco  . Never Used    History  Alcohol Use  . Yes    wine 1-2 week    Family History  Problem Relation Age of Onset  . Hypertension Mother     stroke  . Cancer Brother     prostrate    Review of Systems: The review of systems is per the HPI.  All other systems were reviewed and are negative.  Physical Exam: BP 102/68  Pulse 68  Ht 5\' 7"  (1.702 m)  Wt 140 lb (63.504 kg)  BMI 21.93 kg/m2  LMP 01/14/1970 Patient is very pleasant and in no acute distress. She looks younger than her stated age. Skin is warm and dry. Color is normal.  HEENT is unremarkable. Normocephalic/atraumatic. PERRL. Sclera are nonicteric. Neck is supple. No masses. No JVD. Lungs show diffuse rales and rhonchi.  Cardiac exam shows a regular rate and  rhythm. Abdomen is soft. Extremities are without edema. Gait and ROM are intact. No gross neurologic deficits noted.   LABORATORY DATA:   Assessment / Plan:

## 2011-02-11 NOTE — Assessment & Plan Note (Signed)
Her blood pressure is very well controlled and only mildly elevated during her spells of heart racing/chest pain/shortness of breath. I have left her on her current regimen. We will tentatively see her back in February as scheduled. Patient is agreeable to this plan and will call if any problems develop in the interim.

## 2011-02-11 NOTE — Assessment & Plan Note (Signed)
She feels like the heart racing and pounding is the initial symptom, followed by the chest pain and shortness of breath. Her past episodes seemed to be related to elevated blood pressure. This does not now seem to be the case. We will place an event monitor for the next 30 days. She will continue to monitor her blood pressure at home. Patient is agreeable to this plan and will call if any problems develop in the interim.

## 2011-02-12 DIAGNOSIS — R002 Palpitations: Secondary | ICD-10-CM

## 2011-02-15 DIAGNOSIS — M8580 Other specified disorders of bone density and structure, unspecified site: Secondary | ICD-10-CM

## 2011-02-15 HISTORY — DX: Other specified disorders of bone density and structure, unspecified site: M85.80

## 2011-02-21 ENCOUNTER — Telehealth: Payer: Self-pay | Admitting: Cardiovascular Disease

## 2011-02-21 NOTE — Telephone Encounter (Signed)
New Problem   Patient would like to speak with nurse regarding BP problems, she can be reached on mobile# [phone]

## 2011-02-21 NOTE — Telephone Encounter (Signed)
C/o heart racing last eve, took extra bet blocker like lori np advised, felt better, she is wearing a monitor this month, will check readings, c/o frequent urination, denies burning or pain- just frequency. Advised to have PCP check, she agreed to plan, told to call back with any further questions, she is going out with friends today and wanted [place] to be aware since she has some fear with her heart racing again- told her to take med's with her and keep our number with her too or can go to er if need. Pt agreed.

## 2011-02-22 DIAGNOSIS — E785 Hyperlipidemia, unspecified: Secondary | ICD-10-CM | POA: Diagnosis not present

## 2011-02-25 ENCOUNTER — Other Ambulatory Visit: Payer: Self-pay | Admitting: Internal Medicine

## 2011-02-25 DIAGNOSIS — R002 Palpitations: Secondary | ICD-10-CM | POA: Diagnosis not present

## 2011-02-25 DIAGNOSIS — N644 Mastodynia: Secondary | ICD-10-CM | POA: Diagnosis not present

## 2011-02-25 DIAGNOSIS — E785 Hyperlipidemia, unspecified: Secondary | ICD-10-CM | POA: Diagnosis not present

## 2011-02-25 DIAGNOSIS — I219 Acute myocardial infarction, unspecified: Secondary | ICD-10-CM | POA: Diagnosis not present

## 2011-02-25 DIAGNOSIS — Z780 Asymptomatic menopausal state: Secondary | ICD-10-CM

## 2011-03-04 ENCOUNTER — Encounter: Payer: Self-pay | Admitting: Gynecology

## 2011-03-04 ENCOUNTER — Ambulatory Visit
Admission: RE | Admit: 2011-03-04 | Discharge: 2011-03-04 | Disposition: A | Discharge status: Home / Self Care | Payer: Medicare Other | Source: Ambulatory Visit | Attending: Internal Medicine | Admitting: Internal Medicine

## 2011-03-04 ENCOUNTER — Ambulatory Visit (INDEPENDENT_AMBULATORY_CARE_PROVIDER_SITE_OTHER): Payer: Medicare Other | Admitting: Gynecology

## 2011-03-04 DIAGNOSIS — Z780 Asymptomatic menopausal state: Secondary | ICD-10-CM

## 2011-03-04 DIAGNOSIS — N644 Mastodynia: Secondary | ICD-10-CM

## 2011-03-04 DIAGNOSIS — Z13820 Encounter for screening for osteoporosis: Secondary | ICD-10-CM | POA: Diagnosis not present

## 2011-03-04 DIAGNOSIS — Z79890 Hormone replacement therapy (postmenopausal): Secondary | ICD-10-CM

## 2011-03-04 NOTE — Patient Instructions (Signed)
Try new patch. Call if you want a refill of this.

## 2011-03-04 NOTE — Progress Notes (Signed)
Patient presents with ongoing right breast pain. She's had this for "years". Had mammogram and right breast ultrasound in July which were negative. The pain comes and goes. No palpable abnormalities on self breast exam. No nipple discharge. She is on ERT estradiol patches 0.05 mg for her hot flashes per our August encounter.  Exam was Sherrilyn Rist chaperone present Breasts examined lying and sitting without masses retractions discharge adenopathy.  Assessment and plan: Mastodynia. Exam is negative. Mammogram and ultrasound earlier this last year were also negative. Options for management were reviewed to include stopping ERT, decreasing dose of ERT, trial of Estratest to see if the addition of testosterone doesn't help. After lengthy discussion patient wants to try decreasing the dose I gave her 2 sample weeks of estradiol 0.0375 patches we'll see if this doesn't help. She will call me in 2 weeks.

## 2011-03-06 ENCOUNTER — Ambulatory Visit (INDEPENDENT_AMBULATORY_CARE_PROVIDER_SITE_OTHER): Payer: Medicare Other | Admitting: Cardiovascular Disease

## 2011-03-06 ENCOUNTER — Encounter: Payer: Self-pay | Admitting: Cardiovascular Disease

## 2011-03-06 DIAGNOSIS — I10 Essential (primary) hypertension: Secondary | ICD-10-CM

## 2011-03-06 MED ORDER — PROPRANOLOL HCL 10 MG PO TABS: 10.0000 mg | ORAL_TABLET | Freq: Three times a day (TID) | ORAL | Status: DC

## 2011-03-06 NOTE — Patient Instructions (Signed)
Your physician wants you to follow-up in: 6 months.  You will receive a reminder letter in the mail two months in advance. If you don't receive a letter, please call our office to schedule the follow-up appointment.  Your physician has recommended you make the following change in your medication: Propranolol 10mg  four times per day by mouth.  Your physician recommends that you return for fasting labs work at your 6 month appointment.

## 2011-03-06 NOTE — Assessment & Plan Note (Signed)
Her blood pressure is fairly well controlled today although she continues to have episodes of hypertension with palpitations. We will give her propranolol 10 mg to be used on a as needed basis. She can take up to 4 tablets a day.  I will see her again in 6 months for followup office visit.

## 2011-03-06 NOTE — Progress Notes (Signed)
Sarah [name] Date of Birth  12/15/1921 [facility] [address]    [address] [place], [place]  [address] [phone]  Fax  [phone]  History of Present Illness:  Sarah [name] is a 78 y.o. female with a Hx of HTN and a hospitalization in August, 2012 for BP elevation and NSTEMI.  CT angio was negative for PE.  She has a hx of Legionaires Disease and has had bronchiectasis with chronic rales / rhonchi since that time.  She has done well since her last visit.  SHe has continued to have occasional episodes of HTN and palpitations.  Current Outpatient Prescriptions on File Prior to Visit  Medication Sig Dispense Refill  . acetaminophen-codeine (TYLENOL #3) 300-30 MG per tablet as needed.      . Albuterol (VENTOLIN IN) Inhale into the lungs.        Marland Kitchen amLODipine (NORVASC) 5 MG tablet Take 0.5 tablets (2.5 mg total) by mouth 2 (two) times daily.  30 tablet  11  . aspirin 81 MG tablet Take 81 mg by mouth daily.        . citalopram (CELEXA) 40 MG tablet Take 40 mg by mouth daily. 1/2 tablet daily      . estradiol (VIVELLE-DOT) 0.05 MG/24HR Place 1 patch (0.05 mg total) onto the skin once a week.  8 patch  8  . fluticasone (FLONASE) 50 MCG/ACT nasal spray Place 2 sprays into the nose daily. As needed      . levothyroxine (SYNTHROID, LEVOTHROID) 50 MCG tablet Take 50 mcg by mouth daily.        Marland Kitchen lisinopril (PRINIVIL,ZESTRIL) 5 MG tablet Take 0.5 tablets (2.5 mg total) by mouth 2 (two) times daily.  60 tablet  5  . metoprolol tartrate (LOPRESSOR) 25 MG tablet Take 0.5 tablets (12.5 mg total) by mouth 2 (two) times daily.  60 tablet  11  . montelukast (SINGULAIR) 10 MG tablet Take 10 mg by mouth at bedtime.        . pregabalin (LYRICA) 50 MG capsule Take 50 mg by mouth 2 (two) times daily.         Allergies  Allergen Reactions  . Hydrocodone     Passed out, threw up, blood pressure dropped  . Clarithromycin   . Levofloxacin     Past Medical History  Diagnosis Date  .  Bronchiectasis     with history of Legionaires with chronic rales/rhonchi  . Idiopathic peripheral neuropathy   . Pollen allergies   . Asthma   . Hypertension     Negative renal duplex in December of 2012  . Allergic rhinitis   . NSTEMI (non-ST elevated myocardial infarction) August 2012    Normal coronaries per cath August 2012 and negative CT angio for PE  . S/P cardiac cath August 2012    Normal   . Diastolic dysfunction August 2012    per echo with normal LV function    Past Surgical History  Procedure Date  . Dilation and curettage of uterus   . Total abdominal hysterectomy w/ bilateral salpingoophorectomy 1972    leiomyomata, menorrhagia  . Appendectomy   . Cardiac catheterization August 2012    Normal coronaries.    History  Smoking status  . Never Smoker   Smokeless tobacco  . Never Used    History  Alcohol Use  . Yes    wine 1-2 week    Family History  Problem Relation Age of Onset  . Hypertension Mother  stroke  . Cancer Brother     prostrate    Reviw of Systems:  Reviewed in the HPI.  All other systems are negative.  No headaches.  Occasional palps. No chest pain  Physical Exam: BP 112/75  Pulse 65  Ht 5\' 5"  (1.651 m)  Wt 137 lb 12.8 oz (62.506 kg)  BMI 22.93 kg/m2  LMP 01/14/1970 The patient is alert and oriented x 3.  The mood and affect are normal.   Skin: warm and dry.  Marland Kitchen    HEENT:   [place]. Normal carotids, no JVD  Lungs: bilateral wheezes /  rales / rhonchi .  She has chronic rhonchi due to bronchiectasis.  Heart: RR.    Abdomen: + BS, non tender, nondistended  Extremities:  No c/c/e  Neuro:  Non focal     Assessment / Plan:

## 2011-03-18 ENCOUNTER — Telehealth: Payer: Self-pay | Admitting: *Deleted

## 2011-03-18 NOTE — Telephone Encounter (Signed)
ECARDIO RESULTS GIVEN, NSR/PAC, PT VERBALIZED UNDERSTANDING

## 2011-03-28 ENCOUNTER — Ambulatory Visit (INDEPENDENT_AMBULATORY_CARE_PROVIDER_SITE_OTHER): Payer: Medicare Other | Admitting: Emergency Medicine

## 2011-03-28 ENCOUNTER — Encounter: Payer: Self-pay | Admitting: Emergency Medicine

## 2011-03-28 VITALS — BP 100/74 | HR 67 | Temp 97.7°F | Ht 67.0 in | Wt 134.8 lb

## 2011-03-28 DIAGNOSIS — J479 Bronchiectasis, uncomplicated: Secondary | ICD-10-CM

## 2011-03-28 NOTE — Progress Notes (Signed)
78 yo never smoker, hx remote Legionella PNA, diagnosed with suspected asthma about 10 years ago. PFT's 7/10 with mixed disease, mild AFL. Formerly treated with Advair and albuterol, stopped when she moved to [place] in 2008. Tolerated the change well. Stayed on Singulair. At original consult we d/c'd ACE-I,   ROV 09/14/08 -- Since last visit her cough got a little worse, productive of some small plugs, usually dark rarely with some blood-tinged mucous. PND is gone now that she is on an allergy regimen. Using ventolin rarely.   ROV 11/15/08 -- Returns after having sputum studies last visit - negative for AFB, fungal, bacterial. Developed viral URI symptoms about 10/20 - started to have some breathing pain, productive cough, yellow phlegm. Started on Augmentin, her cough and sputum improved. Didn't have to increase ventolin use. Still coughing frequently. Not using flutter on a schedule.   ROB 02/28/09 -- has been doing well since last time. Was treated with azithro in December after a URI, cough improved. Has albuterol available as needed. Was started on flovent with recent illness, but not taking now. Uses albuterol every couple days. Ran out of fexafenodine recently.   ROV 08/23/09 -- regular f/u for bronchiectasis. She is feling very well, the best she's been in over a year. Was treated for one episode of bronchitis in May treated with doxy. Hasn't been taking her fexofenadine, is on singulair. Taking flonase. Occas has used ventolin, about once a week.   ROV 03/15/10 -- returns for her bronchiectasis, mild AFL. She had a URI in Dec '11, was treated for bronchitis. Now feeling better. Using Ventolin qod. Going to the gym 2x a week. Coughs up dark plugs. Using flutter every other day.   ROV 08/24/10 --  bronchiectasis, mild AFL. Regular f/u. Coughing less, occasionally some dark sputum. CXR done July showed some scar and ? Nodularity. In general she is feeling well. No abx since last visit. Has not been needing  flutter valve every day.   ROV 10/16/10 -- follow up for her bronchiectasis, mild AFL. Unfortunately since last visit she has an MI, managed by Dr Elease Hashimoto, hospitalized in 8/12, coronaries were clean. She was restarted on lisinopril (low dose), has restarted the dry cough. Also has the other stable cough that produces plugs, stable.   ROV 03/28/11 -- bronchiectasis, mild AFL, CAD. She had been doing well this winter until about 2 weeks ago when she developed fatigue, some low grade fever, dry paroxysms of cough, some clear sputum. Her plugs decreased for a while, now they are back. She had a single episode of wheeze that she treated with SABA. She believes that she is pretty much over this illness.   Filed Vitals:   03/28/11 1523  BP: 100/74  Pulse: 67  Temp: 97.7 F (36.5 C)    Gen: Pleasant, well-nourished, in no distress,  normal affect  ENT: No lesions,  mouth clear,  oropharynx clear, no postnasal drip  Neck: No JVD, no TMG, no carotid bruits  Lungs: No use of accessory muscles, no dullness to percussion, few scattered insp crackles L>>R  Cardiovascular: RRR, heart sounds normal, no murmur or gallops, no peripheral edema  Musculoskeletal: No deformities, no cyanosis or clubbing  Neuro: alert, non focal  Skin: Warm, no lesions or rashes  BRONCHIECTASIS Sounds like she had a recent URI with possible flare, but is better without any new meds.  - she will call me if not getting back to baseline - rov 4 months - continue current  meds.

## 2011-03-28 NOTE — Assessment & Plan Note (Addendum)
Sounds like she had a recent URI with possible flare, but is better without any new meds.  - she will call me if not getting back to baseline - rov 4 months - continue current meds.

## 2011-03-28 NOTE — Patient Instructions (Signed)
Please continue your current medications as you are taking them Call our office if you are not getting back to your usual self over the next week Follow with Dr Delton Coombes in 4 months or sooner if you have any problems.

## 2011-04-26 ENCOUNTER — Other Ambulatory Visit: Payer: Self-pay | Admitting: *Deleted

## 2011-04-26 DIAGNOSIS — N951 Menopausal and female climacteric states: Secondary | ICD-10-CM

## 2011-04-26 MED ORDER — ESTRADIOL 0.05 MG/24HR TD PTTW: 1.0000 | MEDICATED_PATCH | TRANSDERMAL | Status: DC

## 2011-04-26 NOTE — Progress Notes (Signed)
Refill on patch.  Uses one weekly #4

## 2011-05-23 DIAGNOSIS — E785 Hyperlipidemia, unspecified: Secondary | ICD-10-CM | POA: Diagnosis not present

## 2011-05-23 DIAGNOSIS — I10 Essential (primary) hypertension: Secondary | ICD-10-CM | POA: Diagnosis not present

## 2011-05-23 DIAGNOSIS — E039 Hypothyroidism, unspecified: Secondary | ICD-10-CM | POA: Diagnosis not present

## 2011-05-23 DIAGNOSIS — I219 Acute myocardial infarction, unspecified: Secondary | ICD-10-CM | POA: Diagnosis not present

## 2011-05-24 DIAGNOSIS — E039 Hypothyroidism, unspecified: Secondary | ICD-10-CM | POA: Diagnosis not present

## 2011-05-24 DIAGNOSIS — J479 Bronchiectasis, uncomplicated: Secondary | ICD-10-CM | POA: Diagnosis not present

## 2011-05-24 DIAGNOSIS — I10 Essential (primary) hypertension: Secondary | ICD-10-CM | POA: Diagnosis not present

## 2011-05-24 DIAGNOSIS — G609 Hereditary and idiopathic neuropathy, unspecified: Secondary | ICD-10-CM | POA: Diagnosis not present

## 2011-06-18 ENCOUNTER — Encounter (HOSPITAL_COMMUNITY): Payer: Self-pay | Admitting: *Deleted

## 2011-06-18 ENCOUNTER — Emergency Department (HOSPITAL_COMMUNITY): Payer: Medicare Other

## 2011-06-18 ENCOUNTER — Inpatient Hospital Stay (HOSPITAL_COMMUNITY)
Admission: EM | Admit: 2011-06-18 | Discharge: 2011-06-20 | DRG: 194 | Disposition: A | Discharge status: Home Health | Payer: Medicare Other | Source: Ambulatory Visit | Attending: Cardiovascular Disease | Admitting: Cardiovascular Disease

## 2011-06-18 DIAGNOSIS — J984 Other disorders of lung: Secondary | ICD-10-CM | POA: Diagnosis present

## 2011-06-18 DIAGNOSIS — R079 Chest pain, unspecified: Secondary | ICD-10-CM | POA: Diagnosis not present

## 2011-06-18 DIAGNOSIS — J45909 Unspecified asthma, uncomplicated: Secondary | ICD-10-CM | POA: Insufficient documentation

## 2011-06-18 DIAGNOSIS — R7989 Other specified abnormal findings of blood chemistry: Secondary | ICD-10-CM

## 2011-06-18 DIAGNOSIS — E039 Hypothyroidism, unspecified: Secondary | ICD-10-CM | POA: Insufficient documentation

## 2011-06-18 DIAGNOSIS — I219 Acute myocardial infarction, unspecified: Secondary | ICD-10-CM | POA: Diagnosis not present

## 2011-06-18 DIAGNOSIS — R55 Syncope and collapse: Secondary | ICD-10-CM | POA: Diagnosis not present

## 2011-06-18 DIAGNOSIS — M1990 Unspecified osteoarthritis, unspecified site: Secondary | ICD-10-CM | POA: Insufficient documentation

## 2011-06-18 DIAGNOSIS — J189 Pneumonia, unspecified organism: Principal | ICD-10-CM | POA: Diagnosis present

## 2011-06-18 DIAGNOSIS — J479 Bronchiectasis, uncomplicated: Secondary | ICD-10-CM

## 2011-06-18 DIAGNOSIS — I10 Essential (primary) hypertension: Secondary | ICD-10-CM | POA: Insufficient documentation

## 2011-06-18 DIAGNOSIS — I214 Non-ST elevation (NSTEMI) myocardial infarction: Secondary | ICD-10-CM | POA: Diagnosis not present

## 2011-06-18 DIAGNOSIS — F32A Depression, unspecified: Secondary | ICD-10-CM | POA: Insufficient documentation

## 2011-06-18 DIAGNOSIS — F329 Major depressive disorder, single episode, unspecified: Secondary | ICD-10-CM | POA: Diagnosis present

## 2011-06-18 DIAGNOSIS — J9819 Other pulmonary collapse: Secondary | ICD-10-CM | POA: Diagnosis present

## 2011-06-18 DIAGNOSIS — R0602 Shortness of breath: Secondary | ICD-10-CM | POA: Diagnosis not present

## 2011-06-18 DIAGNOSIS — F3289 Other specified depressive episodes: Secondary | ICD-10-CM | POA: Diagnosis present

## 2011-06-18 DIAGNOSIS — M129 Arthropathy, unspecified: Secondary | ICD-10-CM | POA: Diagnosis present

## 2011-06-18 DIAGNOSIS — R0789 Other chest pain: Secondary | ICD-10-CM | POA: Diagnosis not present

## 2011-06-18 HISTORY — DX: Personal history of other diseases of the digestive system: Z87.19

## 2011-06-18 HISTORY — DX: Unspecified osteoarthritis, unspecified site: M19.90

## 2011-06-18 HISTORY — DX: Pneumonia, unspecified organism: J18.9

## 2011-06-18 HISTORY — DX: Major depressive disorder, single episode, unspecified: F32.9

## 2011-06-18 HISTORY — DX: Shortness of breath: R06.02

## 2011-06-18 HISTORY — DX: Depression, unspecified: F32.A

## 2011-06-18 HISTORY — DX: Hypothyroidism, unspecified: E03.9

## 2011-06-18 LAB — CBC
HCT: 38.2 % (ref 36.0–46.0)
MCHC: 31.9 g/dL (ref 30.0–36.0)
RDW: 14.3 % (ref 11.5–15.5)

## 2011-06-18 LAB — BASIC METABOLIC PANEL
Calcium: 8.8 mg/dL (ref 8.4–10.5)
Chloride: 102 mEq/L (ref 96–112)
Creatinine, Ser: 0.85 mg/dL (ref 0.50–1.10)
GFR calc Af Amer: 69 mL/min — ABNORMAL LOW (ref >90)
GFR calc non Af Amer: 59 mL/min — ABNORMAL LOW (ref >90)

## 2011-06-18 LAB — DIFFERENTIAL
Basophils Absolute: 0 10*3/uL (ref 0.0–0.1)
Basophils Relative: 0 % (ref 0–1)
Monocytes Absolute: 0.8 10*3/uL (ref 0.1–1.0)
Neutro Abs: 8.2 10*3/uL — ABNORMAL HIGH (ref 1.7–7.7)

## 2011-06-18 LAB — POCT I-STAT TROPONIN I: Troponin i, poc: 0.04 ng/mL (ref 0.00–0.08)

## 2011-06-18 NOTE — ED Notes (Signed)
Pt in via [facility] picked up at grocery store, pt c/o sudden onset L & R sided CP nonradiating, pt c/o Nausea, pt denies vomiting & dizziness, pt took 325 mg ASA prior to arrival, pt rcvd 1 SL nitro in route that did relieve pain, pt hx of MI August 2012, pt states, "I feel the exact same way I did when I had my MI. My MI did not show up on my EKG, but did show up on my enzymes." pt took Lisinopril prior to arrival, Pt Sinus tach with a couple PVCs in route

## 2011-06-18 NOTE — ED Provider Notes (Signed)
History     CSN: [id number]  Arrival date & time 06/18/11  Sarah [name]   First MD Initiated Contact with Patient 06/18/11 1943      Chief Complaint  Patient presents with  . Chest Pain     Patient is a 78 y.o. female presenting with chest pain. The history is provided by the patient.  Chest Pain Episode onset: just prior to arrival. Episode Length: several minutes. Chest pain occurs constantly. The chest pain is improving. The pain is associated with eating. The severity of the pain is moderate. The quality of the pain is described as pressure-like. The pain does not radiate. Primary symptoms include fatigue, shortness of breath and nausea.  Associated symptoms include diaphoresis. She tried nitroglycerin and aspirin for the symptoms.   Pt reports after eating she developed chest pressure/sob/diaphoresis earlier tonight Now feeling improved  She took an ASA No pleuritic pain is reported   Past Medical History  Diagnosis Date  . Bronchiectasis     with history of Legionaires with chronic rales/rhonchi  . Idiopathic peripheral neuropathy   . Pollen allergies   . Asthma   . Hypertension     Negative renal duplex in December of 2012  . Allergic rhinitis   . NSTEMI (non-ST elevated myocardial infarction) August 2012    Normal coronaries per cath August 2012 and negative CT angio for PE  . S/P cardiac cath August 2012    Normal   . Diastolic dysfunction August 2012    per echo with normal LV function    Past Surgical History  Procedure Date  . Dilation and curettage of uterus   . Total abdominal hysterectomy w/ bilateral salpingoophorectomy 1972    leiomyomata, menorrhagia  . Appendectomy   . Cardiac catheterization August 2012    Normal coronaries.    Family History  Problem Relation Age of Onset  . Hypertension Mother     stroke  . Cancer Brother     prostrate    History  Substance Use Topics  . Smoking status: Never Smoker   . Smokeless tobacco: Never Used  .  Alcohol Use: Yes     wine 1-2 week    OB History    Grav Para Term Preterm Abortions TAB SAB Ect Mult Living   2 2        2       Review of Systems  Constitutional: Positive for diaphoresis and fatigue.  Respiratory: Positive for shortness of breath.   Cardiovascular: Positive for chest pain.  Gastrointestinal: Positive for nausea.  All other systems reviewed and are negative.    Allergies  Hydrocodone; Clarithromycin; Levofloxacin; and Oxycodone  Home Medications   Current Outpatient Rx  Name Route Sig Dispense Refill  . ACETAMINOPHEN-CODEINE #3 300-30 MG PO TABS Oral Take 1 tablet by mouth every 4 (four) hours as needed. For pain    . ALBUTEROL SULFATE HFA 108 (90 BASE) MCG/ACT IN AERS Inhalation Inhale 2 puffs into the lungs every 6 (six) hours as needed. For shortness of breath    . AMLODIPINE BESYLATE 5 MG PO TABS Oral Take 0.5 tablets (2.5 mg total) by mouth 2 (two) times daily. 30 tablet 11  . ASPIRIN 81 MG PO TABS Oral Take 81 mg by mouth daily.      Marland Kitchen CITALOPRAM HYDROBROMIDE 40 MG PO TABS Oral Take 40 mg by mouth daily. 1/2 tablet daily    . ESTRADIOL 0.05 MG/24HR TD PTTW Transdermal Place 1 patch onto the skin once  a week. Apply on Thursday    . FLUTICASONE PROPIONATE 50 MCG/ACT NA SUSP Nasal Place 2 sprays into the nose daily. As needed    . LEVOTHYROXINE SODIUM 50 MCG PO TABS Oral Take 50 mcg by mouth daily.      Marland Kitchen LISINOPRIL 5 MG PO TABS Oral Take 0.5 tablets (2.5 mg total) by mouth 2 (two) times daily. 60 tablet 5  . METOPROLOL TARTRATE 25 MG PO TABS Oral Take 0.5 tablets (12.5 mg total) by mouth 2 (two) times daily. 60 tablet 11  . MONTELUKAST SODIUM 10 MG PO TABS Oral Take 10 mg by mouth at bedtime.      Marland Kitchen PREGABALIN 50 MG PO CAPS Oral Take 50 mg by mouth 2 (two) times daily.     Marland Kitchen PROPRANOLOL HCL 10 MG PO TABS Oral Take 10 mg by mouth 3 (three) times daily with meals as needed. For heart per patient      BP 166/82  Pulse 95  Temp(Src) 97.8 F (36.6 C)  (Oral)  Resp 20  SpO2 98%  LMP 01/14/1970  Physical Exam CONSTITUTIONAL: Well developed/well nourished HEAD AND FACE: Normocephalic/atraumatic EYES: EOMI/PERRL ENMT: Mucous membranes moist NECK: supple no meningeal signs SPINE:entire spine nontender CV: S1/S2 noted, no murmurs/rubs/gallops noted LUNGS: rales noted bilaterally, but no distress noted ABDOMEN: soft, nontender, no rebound or guarding GU:no cva tenderness NEURO: Pt is awake/alert, moves all extremitiesx4 EXTREMITIES: pulses normal, full ROM SKIN: warm, color normal PSYCH: no abnormalities of mood noted  ED Course  Procedures    Labs Reviewed  CBC  DIFFERENTIAL  BASIC METABOLIC PANEL   1:61 PM Pt with cardiac cath in 2012 that showed normal coronary arteries   Repeat troponin positive, asked dr Dierdre Highman to page cardiology Pt stable and without return of CP  MDM  Nursing notes reviewed and considered in documentation All labs/vitals reviewed and considered xrays reviewed and considered Previous records reviewed and considered        Date: 06/18/2011  Rate: 100  Rhythm: sinus tachycardia  QRS Axis: left  Intervals: normal  ST/T Wave abnormalities: normal  Conduction Disutrbances:none  Narrative Interpretation:   Old EKG Reviewed: unchanged    Joya Gaskins, MD 06/19/11 [phone]

## 2011-06-18 NOTE — ED Provider Notes (Signed)
11:24 PM Patient is in CDU holding for troponin at 11:45pm.  Sign out received from Dr Bebe Shaggy.  Patient with episode of chest pain, sensation of presyncope earlier today.  Pt reports she is currently chest pain free but feels very tired.  States she became more tired after getting up to go to the bathroom.  States she did not develop chest pain while walking to the bathroom.  Plan is for repeat troponin at 11:45pm.  If this is negative, patient will be d/c home.  I have also ordered a urinalysis.  Patient is comfortable going home tonight as daughter will stay with her.    12:01 AM Patient resting comfortably, troponin has been drawn, patient will give urine for UA.  Dr Bebe Shaggy reassumes care at change of shift.    Results for orders placed during the hospital encounter of 06/18/11  CBC      Component Value Range   WBC 10.1  4.0 - 10.5 (K/uL)   RBC 4.20  3.87 - 5.11 (MIL/uL)   Hemoglobin 12.2  12.0 - 15.0 (g/dL)   HCT 40.9  81.1 - 91.4 (%)   MCV 91.0  78.0 - 100.0 (fL)   MCH 29.0  26.0 - 34.0 (pg)   MCHC 31.9  30.0 - 36.0 (g/dL)   RDW 78.2  95.6 - 21.3 (%)   Platelets 215  150 - 400 (K/uL)  DIFFERENTIAL      Component Value Range   Neutrophils Relative 81 (*) 43 - 77 (%)   Neutro Abs 8.2 (*) 1.7 - 7.7 (K/uL)   Lymphocytes Relative 10 (*) 12 - 46 (%)   Lymphs Abs 1.0  0.7 - 4.0 (K/uL)   Monocytes Relative 8  3 - 12 (%)   Monocytes Absolute 0.8  0.1 - 1.0 (K/uL)   Eosinophils Relative 1  0 - 5 (%)   Eosinophils Absolute 0.1  0.0 - 0.7 (K/uL)   Basophils Relative 0  0 - 1 (%)   Basophils Absolute 0.0  0.0 - 0.1 (K/uL)  BASIC METABOLIC PANEL      Component Value Range   Sodium 136  135 - 145 (mEq/L)   Potassium 3.9  3.5 - 5.1 (mEq/L)   Chloride 102  96 - 112 (mEq/L)   CO2 23  19 - 32 (mEq/L)   Glucose, Bld 152 (*) 70 - 99 (mg/dL)   BUN 18  6 - 23 (mg/dL)   Creatinine, Ser 0.86  0.50 - 1.10 (mg/dL)   Calcium 8.8  8.4 - 57.8 (mg/dL)   GFR calc non Af Amer 59 (*) >90 (mL/min)   GFR  calc Af Amer 69 (*) >90 (mL/min)  POCT I-STAT TROPONIN I      Component Value Range   Troponin i, poc 0.04  0.00 - 0.08 (ng/mL)   Comment 3            Dg Chest Port 1 View  06/18/2011  *RADIOLOGY REPORT*  Clinical Data: Mid and left-sided chest pain.  Chest heaviness.  PORTABLE CHEST - 1 VIEW  Comparison: Chest x-ray 09/05/2010.  Findings: Lung volumes are normal.  No consolidative airspace disease.  Mild diffuse interstitial prominence and patchy areas of peribronchial cuffing are noted, most severe in the right mid and lower lung.  Pulmonary vasculature is normal.  Cardiomediastinal silhouette is within normal limits.  IMPRESSION: 1.  Mild diffuse interstitial prominence with patchy areas of peribronchial cuffing, most severe right mid and lower lung.  These findings are nonspecific, and  are similar to prior examinations, and likely represent areas of post infectious scarring (reference prior CT of the thorax 09/05/2010).  Original Report Authenticated By: Florencia Reasons, M.D.      Dillard Cannon [place], [place] 06/19/11 0002

## 2011-06-19 ENCOUNTER — Encounter (HOSPITAL_COMMUNITY): Payer: Self-pay | Admitting: Cardiology

## 2011-06-19 ENCOUNTER — Emergency Department (HOSPITAL_COMMUNITY): Payer: Medicare Other

## 2011-06-19 DIAGNOSIS — R918 Other nonspecific abnormal finding of lung field: Secondary | ICD-10-CM | POA: Diagnosis not present

## 2011-06-19 DIAGNOSIS — R55 Syncope and collapse: Secondary | ICD-10-CM | POA: Diagnosis not present

## 2011-06-19 DIAGNOSIS — R079 Chest pain, unspecified: Secondary | ICD-10-CM

## 2011-06-19 DIAGNOSIS — R0789 Other chest pain: Secondary | ICD-10-CM | POA: Diagnosis not present

## 2011-06-19 DIAGNOSIS — I219 Acute myocardial infarction, unspecified: Secondary | ICD-10-CM | POA: Diagnosis not present

## 2011-06-19 DIAGNOSIS — J479 Bronchiectasis, uncomplicated: Secondary | ICD-10-CM | POA: Diagnosis not present

## 2011-06-19 DIAGNOSIS — J9819 Other pulmonary collapse: Secondary | ICD-10-CM | POA: Diagnosis not present

## 2011-06-19 DIAGNOSIS — J189 Pneumonia, unspecified organism: Secondary | ICD-10-CM | POA: Diagnosis not present

## 2011-06-19 DIAGNOSIS — E039 Hypothyroidism, unspecified: Secondary | ICD-10-CM | POA: Diagnosis present

## 2011-06-19 DIAGNOSIS — I214 Non-ST elevation (NSTEMI) myocardial infarction: Secondary | ICD-10-CM

## 2011-06-19 DIAGNOSIS — I10 Essential (primary) hypertension: Secondary | ICD-10-CM | POA: Diagnosis not present

## 2011-06-19 DIAGNOSIS — J45909 Unspecified asthma, uncomplicated: Secondary | ICD-10-CM | POA: Diagnosis not present

## 2011-06-19 DIAGNOSIS — R0602 Shortness of breath: Secondary | ICD-10-CM | POA: Diagnosis not present

## 2011-06-19 DIAGNOSIS — I059 Rheumatic mitral valve disease, unspecified: Secondary | ICD-10-CM

## 2011-06-19 DIAGNOSIS — K449 Diaphragmatic hernia without obstruction or gangrene: Secondary | ICD-10-CM | POA: Diagnosis not present

## 2011-06-19 DIAGNOSIS — J984 Other disorders of lung: Secondary | ICD-10-CM | POA: Diagnosis not present

## 2011-06-19 DIAGNOSIS — M129 Arthropathy, unspecified: Secondary | ICD-10-CM | POA: Diagnosis present

## 2011-06-19 DIAGNOSIS — F329 Major depressive disorder, single episode, unspecified: Secondary | ICD-10-CM | POA: Diagnosis present

## 2011-06-19 LAB — CBC
MCH: 28.9 pg (ref 26.0–34.0)
MCHC: 32.3 g/dL (ref 30.0–36.0)
MCV: 89.4 fL (ref 78.0–100.0)
Platelets: 220 10*3/uL (ref 150–400)
RDW: 14.3 % (ref 11.5–15.5)
WBC: 7.6 10*3/uL (ref 4.0–10.5)

## 2011-06-19 LAB — DIFFERENTIAL
Basophils Absolute: 0 10*3/uL (ref 0.0–0.1)
Basophils Relative: 0 % (ref 0–1)
Eosinophils Absolute: 0.1 10*3/uL (ref 0.0–0.7)
Eosinophils Relative: 2 % (ref 0–5)

## 2011-06-19 LAB — COMPREHENSIVE METABOLIC PANEL
AST: 27 U/L (ref 0–37)
Albumin: 3.6 g/dL (ref 3.5–5.2)
BUN: 11 mg/dL (ref 6–23)
Calcium: 9.1 mg/dL (ref 8.4–10.5)
Creatinine, Ser: 0.71 mg/dL (ref 0.50–1.10)

## 2011-06-19 LAB — CARDIAC PANEL(CRET KIN+CKTOT+MB+TROPI)
CK, MB: 4 ng/mL (ref 0.3–4.0)
CK, MB: 4.8 ng/mL — ABNORMAL HIGH (ref 0.3–4.0)
CK, MB: 4.4 ng/mL — ABNORMAL HIGH (ref 0.3–4.0)
Relative Index: INVALID (ref 0.0–2.5)
Total CK: 105 U/L (ref 7–177)
Total CK: 106 U/L (ref 7–177)
Total CK: 107 U/L (ref 7–177)
Total CK: 89 U/L (ref 7–177)
Troponin I: 0.62 ng/mL (ref <0.30)

## 2011-06-19 LAB — URINALYSIS, ROUTINE W REFLEX MICROSCOPIC
Bilirubin Urine: NEGATIVE
Hgb urine dipstick: NEGATIVE
Ketones, ur: 15 mg/dL — AB
Protein, ur: NEGATIVE mg/dL
Urobilinogen, UA: 0.2 mg/dL (ref 0.0–1.0)

## 2011-06-19 LAB — POCT I-STAT TROPONIN I: Troponin i, poc: 0.15 ng/mL (ref 0.00–0.08)

## 2011-06-19 LAB — PROTIME-INR: Prothrombin Time: 13.4 seconds (ref 11.6–15.2)

## 2011-06-19 LAB — STREP PNEUMONIAE URINARY ANTIGEN: Strep Pneumo Urinary Antigen: NEGATIVE

## 2011-06-19 LAB — TSH: TSH: 2.94 u[IU]/mL (ref 0.350–4.500)

## 2011-06-19 MED ORDER — CITALOPRAM HYDROBROMIDE 40 MG PO TABS
40.0000 mg | ORAL_TABLET | Freq: Every day | ORAL | Status: DC
  Filled 2011-06-19 (×2): qty 1

## 2011-06-19 MED ORDER — ASPIRIN 81 MG PO CHEW
81.0000 mg | CHEWABLE_TABLET | Freq: Every day | ORAL | Status: DC
  Administered 2011-06-19 – 2011-06-20 (×2): 81 mg via ORAL
  Filled 2011-06-19 (×2): qty 1

## 2011-06-19 MED ORDER — CITALOPRAM HYDROBROMIDE 20 MG PO TABS
20.0000 mg | ORAL_TABLET | Freq: Every day | ORAL | Status: DC
  Administered 2011-06-19 – 2011-06-20 (×2): 20 mg via ORAL
  Filled 2011-06-19 (×2): qty 1

## 2011-06-19 MED ORDER — SODIUM CHLORIDE 0.9 % IV SOLN: 250.0000 mL | INTRAVENOUS | Status: DC | PRN

## 2011-06-19 MED ORDER — NITROGLYCERIN IN D5W 200-5 MCG/ML-% IV SOLN
3.0000 ug/min | INTRAVENOUS | Status: DC
  Administered 2011-06-19: 5 ug/min via INTRAVENOUS
  Filled 2011-06-19: qty 250

## 2011-06-19 MED ORDER — ACETAMINOPHEN 325 MG PO TABS
650.0000 mg | ORAL_TABLET | ORAL | Status: DC | PRN
  Administered 2011-06-19: 650 mg via ORAL
  Filled 2011-06-19 (×2): qty 2

## 2011-06-19 MED ORDER — AMLODIPINE BESYLATE 2.5 MG PO TABS
2.5000 mg | ORAL_TABLET | Freq: Two times a day (BID) | ORAL | Status: DC
  Administered 2011-06-19 – 2011-06-20 (×3): 2.5 mg via ORAL
  Filled 2011-06-19 (×4): qty 1

## 2011-06-19 MED ORDER — LISINOPRIL 2.5 MG PO TABS
2.5000 mg | ORAL_TABLET | Freq: Two times a day (BID) | ORAL | Status: DC
  Administered 2011-06-19 – 2011-06-20 (×3): 2.5 mg via ORAL
  Filled 2011-06-19 (×4): qty 1

## 2011-06-19 MED ORDER — IOHEXOL 350 MG/ML SOLN
100.0000 mL | Freq: Once | INTRAVENOUS | Status: AC | PRN
  Administered 2011-06-19: 100 mL via INTRAVENOUS

## 2011-06-19 MED ORDER — METOPROLOL TARTRATE 12.5 MG HALF TABLET
12.5000 mg | ORAL_TABLET | Freq: Two times a day (BID) | ORAL | Status: DC
  Administered 2011-06-19 – 2011-06-20 (×3): 12.5 mg via ORAL
  Filled 2011-06-19 (×4): qty 1

## 2011-06-19 MED ORDER — LEVOTHYROXINE SODIUM 50 MCG PO TABS
50.0000 ug | ORAL_TABLET | Freq: Every day | ORAL | Status: DC
  Administered 2011-06-19 – 2011-06-20 (×2): 50 ug via ORAL
  Filled 2011-06-19 (×3): qty 1

## 2011-06-19 MED ORDER — NITROGLYCERIN 0.4 MG SL SUBL: 0.4000 mg | SUBLINGUAL_TABLET | SUBLINGUAL | Status: DC | PRN

## 2011-06-19 MED ORDER — SODIUM CHLORIDE 0.9 % IJ SOLN
3.0000 mL | Freq: Two times a day (BID) | INTRAMUSCULAR | Status: DC
  Administered 2011-06-19 – 2011-06-20 (×3): 3 mL via INTRAVENOUS

## 2011-06-19 MED ORDER — HEPARIN BOLUS VIA INFUSION
3000.0000 [IU] | Freq: Once | INTRAVENOUS | Status: AC
  Administered 2011-06-19: 3000 [IU] via INTRAVENOUS
  Filled 2011-06-19: qty 3000

## 2011-06-19 MED ORDER — SODIUM CHLORIDE 0.9 % IJ SOLN: 3.0000 mL | INTRAMUSCULAR | Status: DC | PRN

## 2011-06-19 MED ORDER — ONDANSETRON HCL 4 MG/2ML IJ SOLN
4.0000 mg | Freq: Four times a day (QID) | INTRAMUSCULAR | Status: DC | PRN
  Administered 2011-06-19: 4 mg via INTRAVENOUS
  Filled 2011-06-19: qty 2

## 2011-06-19 MED ORDER — HEPARIN (PORCINE) IN NACL 100-0.45 UNIT/ML-% IJ SOLN
700.0000 [IU]/h | INTRAMUSCULAR | Status: DC
  Administered 2011-06-19: 700 [IU]/h via INTRAVENOUS
  Filled 2011-06-19: qty 250

## 2011-06-19 MED ORDER — PREGABALIN 50 MG PO CAPS
50.0000 mg | ORAL_CAPSULE | Freq: Two times a day (BID) | ORAL | Status: DC
  Administered 2011-06-19 – 2011-06-20 (×3): 50 mg via ORAL
  Filled 2011-06-19 (×3): qty 1

## 2011-06-19 MED ORDER — ALBUTEROL SULFATE HFA 108 (90 BASE) MCG/ACT IN AERS
2.0000 | INHALATION_SPRAY | Freq: Four times a day (QID) | RESPIRATORY_TRACT | Status: DC | PRN
  Administered 2011-06-19: 2 via RESPIRATORY_TRACT
  Filled 2011-06-19: qty 6.7

## 2011-06-19 MED ORDER — ACETAMINOPHEN-CODEINE #3 300-30 MG PO TABS
1.0000 | ORAL_TABLET | Freq: Four times a day (QID) | ORAL | Status: DC | PRN
  Administered 2011-06-19 (×2): 1 via ORAL
  Filled 2011-06-19 (×2): qty 1

## 2011-06-19 MED ORDER — FLUTICASONE PROPIONATE 50 MCG/ACT NA SUSP
2.0000 | Freq: Every day | NASAL | Status: DC
  Filled 2011-06-19: qty 16

## 2011-06-19 MED ORDER — MONTELUKAST SODIUM 10 MG PO TABS
10.0000 mg | ORAL_TABLET | Freq: Every day | ORAL | Status: DC
  Administered 2011-06-19: 10 mg via ORAL
  Filled 2011-06-19 (×2): qty 1

## 2011-06-19 NOTE — Progress Notes (Signed)
CARDIAC REHAB PHASE I   PRE:  Rate/Rhythm: 64 SR  BP:  Supine:   Sitting: 93/60  Standing:    SaO2: 97 2L  MODE:  Ambulation: 300 ft   POST:  Rate/Rhythem: 67  BP:  Supine:   Sitting: 91/62  Standing:    SaO2: 94 RA 1425-1500 Assisted X 1 to ambulate with hand held assist. Gait a little wobbly. Pt c/o of being tired and not much rest last night. Walked 300 feet without c/o of cp or SOB, just tiredness.VS stable pt back to bed after walk. Room sat after walk 94%, O2 left off.Call light in reach.  Beatrix Fetters

## 2011-06-19 NOTE — H&P (Signed)
Admit date: 06/18/2011 Referring Physician Dr. Dierdre Highman Primary Cardiologist Dr. Elease Hashimoto Chief complaint/reason for admission: Chest pain  HPI: This is a 78yo with a history of NSTMEI with elevated troponin and underwent cath 08/2010 which showed normal coronary arteries and CT angio was negative for PE.  The etiology of her enzyme bump was undetermined.  She has been in her USOH until yesterday when she developed chest pain.  Apparently she was at the grocery store and has sudden onset of left and right sided CP nonradiating and associated with nausea and SOB and presyncope.  She stated it was identical to when she had her NSTEMI.  EMS was called and she was brought to the ER.  She then went to get up and go to the bathroom and felt very fatigued.  EKG in the ER with no changes.  We are now asked to see her due to an elevated troponin with normal CPK.   PMH:    Past Medical History  Diagnosis Date  . Bronchiectasis     with history of Legionaires with chronic rales/rhonchi  . Idiopathic peripheral neuropathy   . Pollen allergies   . Asthma   . Hypertension     Negative renal duplex in December of 2012  . Allergic rhinitis   . NSTEMI (non-ST elevated myocardial infarction) August 2012    Normal coronaries per cath August 2012 and negative CT angio for PE  . S/P cardiac cath August 2012    Normal   . Diastolic dysfunction August 2012    per echo with normal LV function    PSH:    Past Surgical History  Procedure Date  . Dilation and curettage of uterus   . Total abdominal hysterectomy w/ bilateral salpingoophorectomy 1972    leiomyomata, menorrhagia  . Appendectomy   . Cardiac catheterization August 2012    Normal coronaries.    ALLERGIES:   Hydrocodone; Clarithromycin; Levofloxacin; and Oxycodone  Prior to Admit Meds:   (Not in a hospital admission) Family HX:    Family History  Problem Relation Age of Onset  . Hypertension Mother     stroke  . Cancer Brother     prostrate    Social HX:    History   Social History  . Marital Status: Married    Spouse Name: N/A    Number of Children: N/A  . Years of Education: N/A   Occupational History  . retired Engineer, site    Social History Main Topics  . Smoking status: Never Smoker   . Smokeless tobacco: Never Used  . Alcohol Use: Yes     wine 1-2 week  . Drug Use: No  . Sexually Active: No   Other Topics Concern  . Not on file   Social History Narrative  . No narrative on file     ROS:  All 11 ROS were addressed and are negative except what is stated in the HPI  PHYSICAL EXAM Filed Vitals:   06/19/11 0145  BP: 129/66  Pulse: 76  Temp:   Resp: 18   General: Well developed, well nourished, in no acute distress Head: Eyes PERRLA, No xanthomas.   Normal cephalic and atramatic  Lungs:   Clear bilaterally to auscultation and percussion. Heart:   HRRR S1 S2 Pulses are 2+ & equal.            No carotid bruit. No JVD.  No abdominal bruits. No femoral bruits. Abdomen: Bowel sounds are positive, abdomen soft and  non-tender without masses or                  Hernia's noted. Msk:  Back normal, normal gait. Normal strength and tone for age. Extremities:   No clubbing, cyanosis or edema.  DP +1 Neuro: Alert and oriented X 3. Psych:  Good affect, responds appropriately   Labs:   Lab Results  Component Value Date   WBC 10.1 06/18/2011   HGB 12.2 06/18/2011   HCT 38.2 06/18/2011   MCV 91.0 06/18/2011   PLT 215 06/18/2011    Lab 06/18/11 2010  NA 136  K 3.9  CL 102  CO2 23  BUN 18  CREATININE 0.85  CALCIUM 8.8  PROT --  BILITOT --  ALKPHOS --  ALT --  AST --  GLUCOSE 152*   Lab Results  Component Value Date   CKTOTAL 105 06/19/2011   CKMB 4.0 06/19/2011   TROPONINI 0.34* 06/19/2011   No results found for this basename: PTT   Lab Results  Component Value Date   INR 1.00 09/05/2010     Lab Results  Component Value Date   CHOL 181 09/06/2010   Lab Results  Component Value Date   HDL 68  09/06/2010   Lab Results  Component Value Date   LDLCALC 103* 09/06/2010   Lab Results  Component Value Date   TRIG 50 09/06/2010   Lab Results  Component Value Date   CHOLHDL 2.7 09/06/2010   No results found for this basename: LDLDIRECT      Radiology:  *RADIOLOGY REPORT*  Clinical Data: Mid and left-sided chest pain. Chest heaviness.  PORTABLE CHEST - 1 VIEW  Comparison: Chest x-ray 09/05/2010.  Findings: Lung volumes are normal. No consolidative airspace  disease. Mild diffuse interstitial prominence and patchy areas of  peribronchial cuffing are noted, most severe in the right mid and  lower lung. Pulmonary vasculature is normal. Cardiomediastinal  silhouette is within normal limits.  IMPRESSION:  1. Mild diffuse interstitial prominence with patchy areas of  peribronchial cuffing, most severe right mid and lower lung. These  findings are nonspecific, and are similar to prior examinations,  and likely represent areas of post infectious scarring (reference  prior CT of the thorax 09/05/2010).  Original Report Authenticated By: Florencia Reasons, M.D.   EKG:  NSR with no ST changes  ASSESSMENT:  1.  Chest pain with elevated troponin in setting of normal CPK/MB.  This is an identical scenario to last admit in July 2012 with cath showing normal coronary arteries. 2.  Remote NSTEMI 07/2010 with normal coronary arteries by cath 3.  HTN 4.  Bronchiectasis with history of Legionaires Disease 5.  History of diastolic dysfunction  PLAN:   1.  Admit to telemetry 2.  IV Heparin gtt 3.  Cycle cardiac enzymes 4.  IV NTG gtt 5.  Continue ASA/Beta blocker 6.  Stat Chest CT angio to rule out PE pending  Quintella Reichert, MD  06/19/2011  2:31 AM

## 2011-06-19 NOTE — Progress Notes (Addendum)
Pt's troponins continue to trend up. Shanda Bumps, PA made aware. No chest pain today. Will continue to monitor. Duwaine Maxin, RN

## 2011-06-19 NOTE — ED Notes (Signed)
CARDIOLOGY AT BEDSIDE

## 2011-06-19 NOTE — Progress Notes (Signed)
Utilization review completed.  

## 2011-06-19 NOTE — Progress Notes (Addendum)
ANTICOAGULATION CONSULT NOTE - Initial Consult  Pharmacy Consult for Heprin Indication: chest pain/ACS  Allergies  Allergen Reactions  . Hydrocodone     Passed out, threw up, blood pressure dropped  . Clarithromycin     unknown  . Levofloxacin     Pass out  . Oxycodone     Rapid heart beat    Patient Measurements: Height: 5\' 7"  (170.2 cm) Weight: 133 lb 12.8 oz (60.691 kg) IBW/kg (Calculated) : 61.6  Heparin Dosing Weight: 60   Vital Signs: Temp: 97.8 F (36.6 C) (06/05 0429) Temp src: Oral (06/04 1909) BP: 149/84 mmHg (06/05 0429) Pulse Rate: 72  (06/05 0429)  Labs:  Basename 06/19/11 0046 06/18/11 2010  HGB -- 12.2  HCT -- 38.2  PLT -- 215  APTT -- --  LABPROT -- --  INR -- --  HEPARINUNFRC -- --  CREATININE -- 0.85  CKTOTAL 105 --  CKMB 4.0 --  TROPONINI 0.34* --    Estimated Creatinine Clearance: 43.8 ml/min (by C-G formula based on Cr of 0.85).   Medical History: Past Medical History  Diagnosis Date  . Bronchiectasis     with history of Legionaires with chronic rales/rhonchi  . Idiopathic peripheral neuropathy   . Pollen allergies   . Asthma   . Hypertension     Negative renal duplex in December of 2012  . Allergic rhinitis   . NSTEMI (non-ST elevated myocardial infarction) August 2012    Normal coronaries per cath August 2012 and negative CT angio for PE  . S/P cardiac cath August 2012    Normal   . Diastolic dysfunction August 2012    per echo with normal LV function    Medications:  Prescriptions prior to admission  Medication Sig Dispense Refill  . acetaminophen-codeine (TYLENOL #3) 300-30 MG per tablet Take 1 tablet by mouth every 4 (four) hours as needed. For pain      . albuterol (PROVENTIL HFA;VENTOLIN HFA) 108 (90 BASE) MCG/ACT inhaler Inhale 2 puffs into the lungs every 6 (six) hours as needed. For shortness of breath      . amLODipine (NORVASC) 5 MG tablet Take 0.5 tablets (2.5 mg total) by mouth 2 (two) times daily.  30 tablet   11  . aspirin 81 MG tablet Take 81 mg by mouth daily.        . citalopram (CELEXA) 40 MG tablet Take 40 mg by mouth daily. 1/2 tablet daily      . estradiol (VIVELLE-DOT) 0.05 MG/24HR Place 1 patch onto the skin once a week. Apply on Thursday      . fluticasone (FLONASE) 50 MCG/ACT nasal spray Place 2 sprays into the nose daily. As needed      . levothyroxine (SYNTHROID, LEVOTHROID) 50 MCG tablet Take 50 mcg by mouth daily.        Marland Kitchen lisinopril (PRINIVIL,ZESTRIL) 5 MG tablet Take 0.5 tablets (2.5 mg total) by mouth 2 (two) times daily.  60 tablet  5  . metoprolol tartrate (LOPRESSOR) 25 MG tablet Take 0.5 tablets (12.5 mg total) by mouth 2 (two) times daily.  60 tablet  11  . montelukast (SINGULAIR) 10 MG tablet Take 10 mg by mouth at bedtime.        . pregabalin (LYRICA) 50 MG capsule Take 50 mg by mouth 2 (two) times daily.       . propranolol (INDERAL) 10 MG tablet Take 10 mg by mouth 3 (three) times daily with meals as needed. For heart per  patient        Assessment: 78 yo female with chest pain for Heparin  Goal of Therapy:  Heparin level 0.3-0.7 units/ml Monitor platelets by anticoagulation protocol: Yes   Plan:  Heparin 3000 units IV bolus, then 700 units/hr Check heparin level in 8 hours.  Eddie Candle 06/19/2011,4:31 AM

## 2011-06-19 NOTE — ED Notes (Signed)
Re-paged Dr. Mayford Knife to [phone]

## 2011-06-19 NOTE — ED Notes (Signed)
Patient transported to CT 

## 2011-06-19 NOTE — ED Notes (Signed)
Total CK 105, CK-MB 4.0

## 2011-06-19 NOTE — Progress Notes (Signed)
PROGRESS NOTE  Subjective:   Pt is admitted with chest pain.  Was admitted with identical symptoms previously and cardiac cath revealed no significant CAD.  CT angio was negative for PE but did show signs of early pneumonia.  She has chronic bronchiectasis.    Objective:    Vital Signs:   Temp:  [97.8 F (36.6 C)] 97.8 F (36.6 C) (06/05 0429) Pulse Rate:  [72-95] 72  (06/05 0429) Resp:  [18-20] 20  (06/05 0429) BP: (128-166)/(66-84) 149/84 mmHg (06/05 0429) SpO2:  [94 %-98 %] 94 % (06/05 0429) Weight:  [133 lb 9.6 oz (60.6 kg)-133 lb 12.8 oz (60.691 kg)] 133 lb 12.8 oz (60.691 kg) (06/05 0429)  Last BM Date: 06/18/11   24-hour weight change: Weight change:   Weight trends: Filed Weights   06/19/11 0421 06/19/11 0429  Weight: 133 lb 9.6 oz (60.6 kg) 133 lb 12.8 oz (60.691 kg)    Intake/Output:    Total I/O In: 48.7 [I.V.:48.7] Out: -    Physical Exam: BP 149/84  Pulse 72  Temp(Src) 97.8 F (36.6 C) (Oral)  Resp 20  Ht 5\' 7"  (1.702 m)  Wt 133 lb 12.8 oz (60.691 kg)  BMI 20.96 kg/m2  SpO2 94%  LMP 01/14/1970  General: Vital signs reviewed and noted. Well-developed, well-nourished, in no acute distress; alert, appropriate and cooperative .  Head: Normocephalic, atraumatic.  Eyes: conjunctivae/corneas clear. PERRL, EOM's intact.   Throat: normal  Neck: Supple. Normal carotids. No JVD  Lungs:  Rales in both bases.  Heart: Regular rate,  With normal  S1 S2. No murmurs, gallops or rubs  Abdomen:  Soft, non-tender, non-distended with normoactive bowel sounds. No hepatomegaly. No rebound/guarding. No abdominal masses.  Extremities: Distal pedal pulses are 2+ .  No edema.    Neurologic: A&O X3, CN II - XII are grossly intact. Motor strength is 5/5 in the all 4 extremities.  Psych: Responds to questions appropriately with normal affect.    Labs: BMET:  Basename 06/19/11 0521 06/18/11 2010  NA 134* 136  K 3.9 3.9  CL 101 102  CO2 23 23  GLUCOSE 92 152*    BUN 11 18  CREATININE 0.71 0.85  CALCIUM 9.1 8.8  MG -- --  PHOS -- --    Liver function tests:  Basename 06/19/11 0521  AST 27  ALT 9  ALKPHOS 98  BILITOT 0.3  PROT 7.2  ALBUMIN 3.6   No results found for this basename: LIPASE:2,AMYLASE:2 in the last 72 hours  CBC:  Basename 06/19/11 0521 06/18/11 2010  WBC 7.6 10.1  NEUTROABS 4.6 8.2*  HGB 12.5 12.2  HCT 38.7 38.2  MCV 89.4 91.0  PLT 220 215    Cardiac Enzymes:  Basename 06/19/11 0521 06/19/11 0046  CKTOTAL 106 105  CKMB 4.8* 4.0  TROPONINI 0.41* 0.34*    Coagulation Studies:  Basename 06/19/11 0521  LABPROT 13.4  INR 1.00    Other:   Tele:  NSR  Medications:    Infusions:    . heparin 700 Units/hr (06/19/11 0520)  . nitroGLYCERIN 10 mcg/min (06/19/11 1610)    Scheduled Medications:    . amLODipine  2.5 mg Oral BID  . aspirin  81 mg Oral Daily  . citalopram  40 mg Oral Daily  . fluticasone  2 spray Each Nare Daily  . heparin  3,000 Units Intravenous Once  . levothyroxine  50 mcg Oral Q breakfast  . lisinopril  2.5 mg Oral BID  .  metoprolol tartrate  12.5 mg Oral BID  . montelukast  10 mg Oral QHS  . pregabalin  50 mg Oral BID  . sodium chloride  3 mL Intravenous Q12H    Assessment/ Plan:    1. Community acquired pneumonia:  By CT scan.  Pulmonary to see. 2. Elevated Troponin:  I do not think this is a NSTEMI.  Troponin can be elevated in multiple conditions.  I would like to draw a Troponin level the next time she is in the office to see if it is elevated at baseline. Will DC heparin and NTG.   Disposition: pulmonary consult  Length of Stay: 1  Vesta Mixer, Montez Hageman., MD, [facility] 06/19/2011, 9:07 AM Office [phone] Pager [phone]

## 2011-06-19 NOTE — Consult Note (Signed)
Patient: Sarah [name] DOB: 09/16/1921 Date of Admission: 06/18/2011            Pulmonary consult  Date of Consult: 06/19/2011 MD requesting consult:  Dr. Elease Hashimoto Reason for consult:  Bronchiectasis, ?MAC Primary pulmonary -- Dr. Delton Coombes    HPI -  78yo female with hx bronchiectasis, remote hx legionella PNA, HTN, diastolic dysfunction presented 6/5 with c/o acute onset R sided chest pain.  Pain was non radiating and associated with nausea, SOB and presyncope.  She has no EKG changes in Er but did have slight bump in troponin and she was admitted by cardiology.  CTA chest performed was neg for PE but showed evidence ?atypical infection and subsegmental atx and PCCM consulted.  Of note she was seen by Dr. Delton Coombes as outpt in March with vague s/s low grade fever, productive cough but felt she was getting better.  Now she thinks that although she did feel some better she likely never got back to baseline after that illness.    Allergies:  Allergies  Allergen Reactions  . Hydrocodone     Passed out, threw up, blood pressure dropped  . Clarithromycin     unknown  . Levofloxacin     Pass out  . Oxycodone     Rapid heart beat     PMH: Past Medical History  Diagnosis Date  . Bronchiectasis     with history of Legionaires with chronic rales/rhonchi  . Idiopathic peripheral neuropathy   . Pollen allergies   . Asthma   . Hypertension     Negative renal duplex in December of 2012  . Allergic rhinitis   . NSTEMI (non-ST elevated myocardial infarction) August 2012    Normal coronaries per cath August 2012 and negative CT angio for PE  . S/P cardiac cath August 2012    Normal   . Diastolic dysfunction August 2012    per echo with normal LV function    Home meds: Medications Prior to Admission  Medication Sig Dispense Refill  . acetaminophen-codeine (TYLENOL #3) 300-30 MG per tablet Take 1 tablet by mouth every 4 (four) hours as needed. For pain      . albuterol (PROVENTIL HFA;VENTOLIN HFA)  108 (90 BASE) MCG/ACT inhaler Inhale 2 puffs into the lungs every 6 (six) hours as needed. For shortness of breath      . amLODipine (NORVASC) 5 MG tablet Take 0.5 tablets (2.5 mg total) by mouth 2 (two) times daily.  30 tablet  11  . aspirin 81 MG tablet Take 81 mg by mouth daily.        . citalopram (CELEXA) 40 MG tablet Take 40 mg by mouth daily. 1/2 tablet daily      . estradiol (VIVELLE-DOT) 0.05 MG/24HR Place 1 patch onto the skin once a week. Apply on Thursday      . fluticasone (FLONASE) 50 MCG/ACT nasal spray Place 2 sprays into the nose daily. As needed      . levothyroxine (SYNTHROID, LEVOTHROID) 50 MCG tablet Take 50 mcg by mouth daily.        Marland Kitchen lisinopril (PRINIVIL,ZESTRIL) 5 MG tablet Take 0.5 tablets (2.5 mg total) by mouth 2 (two) times daily.  60 tablet  5  . metoprolol tartrate (LOPRESSOR) 25 MG tablet Take 0.5 tablets (12.5 mg total) by mouth 2 (two) times daily.  60 tablet  11  . montelukast (SINGULAIR) 10 MG tablet Take 10 mg by mouth at bedtime.        Marland Kitchen  pregabalin (LYRICA) 50 MG capsule Take 50 mg by mouth 2 (two) times daily.       . propranolol (INDERAL) 10 MG tablet Take 10 mg by mouth 3 (three) times daily with meals as needed. For heart per patient       Social Hx: History   Social History  . Marital Status: Married    Spouse Name: N/A    Number of Children: N/A  . Years of Education: N/A   Occupational History  . retired Engineer, site    Social History Main Topics  . Smoking status: Never Smoker   . Smokeless tobacco: Never Used  . Alcohol Use: Yes     wine 1-2 week  . Drug Use: No  . Sexually Active: No   Other Topics Concern  . Not on file   Social History Narrative  . No narrative on file     Family Hx: Family History  Problem Relation Age of Onset  . Hypertension Mother     stroke  . Cancer Brother     prostrate     ROS: C/o SOB although improved, nausea, headache, gen malaise.  Still with intermittent dull chest pain, substernal,  non radiating.  Denies hemoptysis, edema, orthopnea.  All other systems reviewed and were neg.   Filed Vitals:   06/18/11 2048 06/19/11 0145 06/19/11 0421 06/19/11 0429  BP: 128/67 129/66  149/84  Pulse: 83 76  72  Temp:    97.8 F (36.6 C)  TempSrc:      Resp: 20 18  20   Height:   5\' 9"  (1.753 m) 5\' 7"  (1.702 m)  Weight:   133 lb 9.6 oz (60.6 kg) 133 lb 12.8 oz (60.691 kg)  SpO2: 97% 96%  94%    chest X-ray Ct Angio Chest W/cm &/or Wo Cm  06/19/2011  *RADIOLOGY REPORT*  Clinical Data: Right-sided chest pain.  CT ANGIOGRAPHY CHEST  Technique:  Multidetector CT imaging of the chest using the standard protocol during bolus administration of intravenous contrast. Multiplanar reconstructed images including MIPs were obtained and reviewed to evaluate the vascular anatomy.  Contrast: OMNIPAQUE IOHEXOL 350 MG/ML SOLN  Comparison: 09/05/2010.  Findings: The chest wall is unremarkable.  No breast masses, supraclavicular or axillary lymphadenopathy.  The bony thorax is intact.  No destructive bone lesions or spinal canal compromise.  The heart is normal in size.  No pericardial effusion.  No mediastinal or hilar lymphadenopathy.  Stable mediastinal lymph nodes.  The aorta is normal in caliber.  No dissection.  The esophagus is grossly normal.  Stable large hiatal hernia.  The pulmonary arterial tree is fairly well opacified.  No filling defects to suggest pulmonary emboli.  Examination of the lung parenchyma demonstrates patchy areas of subpleural scarring changes and bronchiectasis.  No worrisome infiltrates or mass lesions.  New patchy area of tree in bud appearance in the right middle lobe typically seen with atypical infections (MAC).  No pleural effusion or pulmonary edema.  The upper abdomen is unremarkable.  IMPRESSION:  1.  Stable areas of subsegmental atelectasis and scarring with bronchiectasis. 2.  New areas of tree in bud appearance most likely atypical infection (MAC). 3.  Stable mediastinal  and hilar lymph nodes. 4.  No findings for pulmonary embolism. 5.  Stable hiatal hernia.  Original Report Authenticated By: P. Loralie Champagne, M.D.   Dg Chest Port 1 View  06/18/2011  *RADIOLOGY REPORT*  Clinical Data: Mid and left-sided chest pain.  Chest heaviness.  PORTABLE CHEST - 1 VIEW  Comparison: Chest x-ray 09/05/2010.  Findings: Lung volumes are normal.  No consolidative airspace disease.  Mild diffuse interstitial prominence and patchy areas of peribronchial cuffing are noted, most severe in the right mid and lower lung.  Pulmonary vasculature is normal.  Cardiomediastinal silhouette is within normal limits.  IMPRESSION: 1.  Mild diffuse interstitial prominence with patchy areas of peribronchial cuffing, most severe right mid and lower lung.  These findings are nonspecific, and are similar to prior examinations, and likely represent areas of post infectious scarring (reference prior CT of the thorax 09/05/2010).  Original Report Authenticated By: Florencia Reasons, M.D.     CBC    Component Value Date/Time   WBC 7.6 06/19/2011 0521   RBC 4.33 06/19/2011 0521   HGB 12.5 06/19/2011 0521   HCT 38.7 06/19/2011 0521   PLT 220 06/19/2011 0521   MCV 89.4 06/19/2011 0521   MCH 28.9 06/19/2011 0521   MCHC 32.3 06/19/2011 0521   RDW 14.3 06/19/2011 0521   LYMPHSABS 2.0 06/19/2011 0521   MONOABS 0.8 06/19/2011 0521   EOSABS 0.1 06/19/2011 0521   BASOSABS 0.0 06/19/2011 0521     BMET    Component Value Date/Time   NA 134* 06/19/2011 0521   K 3.9 06/19/2011 0521   CL 101 06/19/2011 0521   CO2 23 06/19/2011 0521   GLUCOSE 92 06/19/2011 0521   BUN 11 06/19/2011 0521   CREATININE 0.71 06/19/2011 0521   CALCIUM 9.1 06/19/2011 0521   GFRNONAA 75* 06/19/2011 0521   GFRAA 87* 06/19/2011 0521      Lab 06/19/11 0521 06/19/11 0046  TROPONINI 0.41* 0.34*      EXAM: Filed Vitals:   06/19/11 1348  BP: 89/57  Pulse: 69  Temp: 97.5 F (36.4 C)  Resp: 18  General:  Frail, elderly, pleasant female, NAD  Neuro: awake, alert,  appropriate, MAE CV: s1s2 rrr, no m/r/g PULM:  resps even non labored on [place], crackles throughout, more dense L base  GI: abd soft, +bs Extremities: warm and dry, pale, no edema     IMPRESSION/ PLAN:  Abnormal CT chest -- hx bronchiectasis, now with changes on CT ?atypical infection RML with patchy tree in bud appearance.  Also has remote hx legionella PNA.  Per history she may have had some lingering illness since March.  No significant SOB at this time, afebrile, no leukocytosis.  This doesn't look like CAP clinically or on CT scan.  Micronodular disease wouldn't cause CP - etiology not clear to me as she had reassuring L cath in 08/2010. She does have positive troponin - significance unclear (has had NSTEMI in the past but cath as above). PLAN -  Discussed her CT scan with her today, the possibility of MAIC. Explained that this may be related to her fatigue, lethargy, but that it would not cause CP or syncope. Discussed pro's/con's of FOB to perform BAL. At this time she would like to defer, repeat CT scan of the chest in 6 months to assess for interval change. I agree with this plan. We have f/u in 9/13, can discuss further at that time.  Cont cardiac w/u per cards  Cont home BD  O2 as needed  Sputum culture if able  Pulmonary hygiene  Would not start abx.  She can be d/c to home when her sx resolve and when clear to do so from cards perspective.   Danford Bad, NP 06/19/2011  8:33 AM Pager: [phone]  *  Care during the described time interval was provided by me and/or other providers on the critical care team. I have reviewed this patient's available data, including medical history, events of note, physical examination and test results as part of my evaluation.   Attending Addendum:  I have seen the patient, discussed the issues, test results and plans with K. Whiteheart, NP. I agree with the Assessment and Plans as ammended above.   Levy Pupa, MD, PhD 06/19/2011, 3:54  PM [facility] [phone] or if no answer [phone]

## 2011-06-19 NOTE — ED Notes (Addendum)
1:22 AM discussed case with Dr. Mayford Knife on call for cardiology. She recommends repeat cardiac markers as a lab not i-STAT troponin and call her back. Repeat EKG unchanged. Patient very anxious but without chest pain at this time.  Sunnie Nielsen, MD 06/19/11 0124  1:56 AM serum troponin comes back at 0.34, I consulted cardiology again and spoke with Dr. Mayford Knife. I reviewed her previous cath over the phone with Dr. Mayford Knife which showed normal coronary arteries in August of 2012. Dr. Mayford Knife recommends that we do CT scan of her chest to evaluate for PE and requests that I call her back with those results. Patient is having some repeat chest pain at this time. Dr. Mayford Knife aware. Repeat EKG does not demonstrate any ST changes.  Sunnie Nielsen, MD 06/19/11 0157  Results for orders placed during the hospital encounter of 06/18/11  CBC      Component Value Range   WBC 10.1  4.0 - 10.5 (K/uL)   RBC 4.20  3.87 - 5.11 (MIL/uL)   Hemoglobin 12.2  12.0 - 15.0 (g/dL)   HCT 14.7  82.9 - 56.2 (%)   MCV 91.0  78.0 - 100.0 (fL)   MCH 29.0  26.0 - 34.0 (pg)   MCHC 31.9  30.0 - 36.0 (g/dL)   RDW 13.0  86.5 - 78.4 (%)   Platelets 215  150 - 400 (K/uL)  DIFFERENTIAL      Component Value Range   Neutrophils Relative 81 (*) 43 - 77 (%)   Neutro Abs 8.2 (*) 1.7 - 7.7 (K/uL)   Lymphocytes Relative 10 (*) 12 - 46 (%)   Lymphs Abs 1.0  0.7 - 4.0 (K/uL)   Monocytes Relative 8  3 - 12 (%)   Monocytes Absolute 0.8  0.1 - 1.0 (K/uL)   Eosinophils Relative 1  0 - 5 (%)   Eosinophils Absolute 0.1  0.0 - 0.7 (K/uL)   Basophils Relative 0  0 - 1 (%)   Basophils Absolute 0.0  0.0 - 0.1 (K/uL)  BASIC METABOLIC PANEL      Component Value Range   Sodium 136  135 - 145 (mEq/L)   Potassium 3.9  3.5 - 5.1 (mEq/L)   Chloride 102  96 - 112 (mEq/L)   CO2 23  19 - 32 (mEq/L)   Glucose, Bld 152 (*) 70 - 99 (mg/dL)   BUN 18  6 - 23 (mg/dL)   Creatinine, Ser 6.96  0.50 - 1.10 (mg/dL)   Calcium 8.8  8.4 - 29.5 (mg/dL)   GFR calc non  Af Amer 59 (*) >90 (mL/min)   GFR calc Af Amer 69 (*) >90 (mL/min)  POCT I-STAT TROPONIN I      Component Value Range   Troponin i, poc 0.04  0.00 - 0.08 (ng/mL)   Comment 3           URINALYSIS, ROUTINE W REFLEX MICROSCOPIC      Component Value Range   Color, Urine YELLOW  YELLOW    APPearance CLEAR  CLEAR    Specific Gravity, Urine 1.009  1.005 - 1.030    pH 7.0  5.0 - 8.0    Glucose, UA NEGATIVE  NEGATIVE (mg/dL)   Hgb urine dipstick NEGATIVE  NEGATIVE    Bilirubin Urine NEGATIVE  NEGATIVE    Ketones, ur 15 (*) NEGATIVE (mg/dL)   Protein, ur NEGATIVE  NEGATIVE (mg/dL)   Urobilinogen, UA 0.2  0.0 - 1.0 (mg/dL)   Nitrite NEGATIVE  NEGATIVE  Leukocytes, UA NEGATIVE  NEGATIVE   POCT I-STAT TROPONIN I      Component Value Range   Troponin i, poc 0.15 (*) 0.00 - 0.08 (ng/mL)   Comment NOTIFIED PHYSICIAN     Comment 3           CARDIAC PANEL(CRET KIN+CKTOT+MB+TROPI)      Component Value Range   Total CK 105  7 - 177 (U/L)   CK, MB 4.0  0.3 - 4.0 (ng/mL)   Troponin I 0.34 (*) <0.30 (ng/mL)   Relative Index 3.8 (*) 0.0 - 2.5    Ct Angio Chest W/cm &/or Wo Cm  06/19/2011  *RADIOLOGY REPORT*  Clinical Data: Right-sided chest pain.  CT ANGIOGRAPHY CHEST  Technique:  Multidetector CT imaging of the chest using the standard protocol during bolus administration of intravenous contrast. Multiplanar reconstructed images including MIPs were obtained and reviewed to evaluate the vascular anatomy.  Contrast: OMNIPAQUE IOHEXOL 350 MG/ML SOLN  Comparison: 09/05/2010.  Findings: The chest wall is unremarkable.  No breast masses, supraclavicular or axillary lymphadenopathy.  The bony thorax is intact.  No destructive bone lesions or spinal canal compromise.  The heart is normal in size.  No pericardial effusion.  No mediastinal or hilar lymphadenopathy.  Stable mediastinal lymph nodes.  The aorta is normal in caliber.  No dissection.  The esophagus is grossly normal.  Stable large hiatal hernia.   The pulmonary arterial tree is fairly well opacified.  No filling defects to suggest pulmonary emboli.  Examination of the lung parenchyma demonstrates patchy areas of subpleural scarring changes and bronchiectasis.  No worrisome infiltrates or mass lesions.  New patchy area of tree in bud appearance in the right middle lobe typically seen with atypical infections (MAC).  No pleural effusion or pulmonary edema.  The upper abdomen is unremarkable.  IMPRESSION:  1.  Stable areas of subsegmental atelectasis and scarring with bronchiectasis. 2.  New areas of tree in bud appearance most likely atypical infection (MAC). 3.  Stable mediastinal and hilar lymph nodes. 4.  No findings for pulmonary embolism. 5.  Stable hiatal hernia.  Original Report Authenticated By: P. Loralie Champagne, M.D.   Dg Chest Port 1 View  06/18/2011  *RADIOLOGY REPORT*  Clinical Data: Mid and left-sided chest pain.  Chest heaviness.  PORTABLE CHEST - 1 VIEW  Comparison: Chest x-ray 09/05/2010.  Findings: Lung volumes are normal.  No consolidative airspace disease.  Mild diffuse interstitial prominence and patchy areas of peribronchial cuffing are noted, most severe in the right mid and lower lung.  Pulmonary vasculature is normal.  Cardiomediastinal silhouette is within normal limits.  IMPRESSION: 1.  Mild diffuse interstitial prominence with patchy areas of peribronchial cuffing, most severe right mid and lower lung.  These findings are nonspecific, and are similar to prior examinations, and likely represent areas of post infectious scarring (reference prior CT of the thorax 09/05/2010).  Original Report Authenticated By: Florencia Reasons, M.D.    Dr. Mayford Knife evaluated bedside and admitted patient. Requested pulmonary consult for MAC, for recommendations and to follow patient in the hospital.  Sunnie Nielsen, MD 06/19/11 0326  3:39 AM discussed with pulmonary on call Dr. Darrick Penna, she recommends no treatment at this time and pulmonology  will follow.   Sunnie Nielsen, MD 06/19/11 [phone]

## 2011-06-19 NOTE — Progress Notes (Signed)
Results of Chest CT reviewed which show no evidence of PE but she has new areas of tree in bud appearance c/w atypical infection and stable areas of subsegmental atelectasis ans scarring with bronchiectasis.  I suspect she has not had an acute coronary event and most likely the bump in troponin is a type 2 NSTEMI from demand ischemia possible from underlying lung infection.  She has chronic lung disease and therefore may have a chronically elevated troponin.  We will follow trend in enzymes.  I will consult Pulmonary Med for management of findings noted on chest CT.

## 2011-06-19 NOTE — Progress Notes (Signed)
  Echocardiogram 2D Echocardiogram has been performed.  Cathie Beams Deneen 06/19/2011, 11:50 AM

## 2011-06-19 NOTE — ED Notes (Signed)
Patient complains of 4/10 nonradiating midchest pain. No sob, diaphoresis, back pain, abd pain, shoulder or arm pain. EKG done with no significant changes, no ST elevations or depression. EKG shown to Dr. Dierdre Highman. Pt aaox4. resp e/u. NAD. Will continue to monitor.

## 2011-06-19 NOTE — ED Provider Notes (Signed)
Medical screening examination/treatment/procedure(s) were conducted as a shared visit with non-physician practitioner(s) and myself.  I personally evaluated the patient during the encounter   Joya Gaskins, MD 06/19/11 [phone]

## 2011-06-19 NOTE — Progress Notes (Addendum)
Patient c/o SOB with chest pressure, stated similar to previous MI symptoms.  Appeared uncomfortable and was clutching mid chest.  Pale VS were:  P 81, BP 156/88, SP 02 95% on 2L per [place].  Increased NTG gtt from 66mcg/min to 64mcg/min, increased O2 to 4L per .  Sat increased to 98%.  12-lead EKG obtained:  Normal sinus rhythm with normal axis and good R wave progression.  No Q waves present.  Some T-wave flattening in V1, V2 and V3 which is unchanged from previous.  Known elevated troponin I markers.  MD is aware and patient currently receiving IV heparin and nitroglycerin. Notified pharmacy for albuterol MDI for patient comfort.  Will give when available. Will continue to closely monitor.  Addendum for 06:40:  Patient reports no chest pressure 5 minutes after NTG and O2 increased; currently endorsing mild headache which has been previously medicated.  Continuing to monitor closely.

## 2011-06-19 NOTE — Progress Notes (Signed)
Noted pt just admitted this am and is on bedrest. Please address activity for today. Will f/u this pm. Ethelda Chick CES, ACSM

## 2011-06-19 NOTE — ED Notes (Signed)
Critical Troponin 0.34 reported from main lab. Dr. Dierdre Highman notified

## 2011-06-19 NOTE — ED Notes (Signed)
Troponin results given to Dr. Bebe Shaggy by B. Bing Plume, EMT

## 2011-06-20 ENCOUNTER — Inpatient Hospital Stay (HOSPITAL_COMMUNITY): Payer: Medicare Other

## 2011-06-20 ENCOUNTER — Encounter (HOSPITAL_COMMUNITY): Payer: Self-pay | Admitting: Nurse Practitioner

## 2011-06-20 DIAGNOSIS — F329 Major depressive disorder, single episode, unspecified: Secondary | ICD-10-CM | POA: Insufficient documentation

## 2011-06-20 DIAGNOSIS — E039 Hypothyroidism, unspecified: Secondary | ICD-10-CM | POA: Insufficient documentation

## 2011-06-20 DIAGNOSIS — M1990 Unspecified osteoarthritis, unspecified site: Secondary | ICD-10-CM | POA: Insufficient documentation

## 2011-06-20 DIAGNOSIS — F32A Depression, unspecified: Secondary | ICD-10-CM | POA: Insufficient documentation

## 2011-06-20 DIAGNOSIS — R799 Abnormal finding of blood chemistry, unspecified: Secondary | ICD-10-CM | POA: Insufficient documentation

## 2011-06-20 LAB — CBC
Hemoglobin: 11.3 g/dL — ABNORMAL LOW (ref 12.0–15.0)
MCH: 28.7 pg (ref 26.0–34.0)
MCV: 90.4 fL (ref 78.0–100.0)
RBC: 3.94 MIL/uL (ref 3.87–5.11)
WBC: 7.5 10*3/uL (ref 4.0–10.5)

## 2011-06-20 LAB — LEGIONELLA ANTIGEN, URINE: Legionella Antigen, Urine: NEGATIVE

## 2011-06-20 LAB — URINE CULTURE: Culture: NO GROWTH

## 2011-06-20 MED ORDER — NITROGLYCERIN 0.4 MG SL SUBL: 0.4000 mg | SUBLINGUAL_TABLET | SUBLINGUAL | Status: DC | PRN

## 2011-06-20 NOTE — Progress Notes (Signed)
Received D/C order for pt.  Pt stable with no s/s of distress.  Reviewed pt d/c instructions and medications.  Pt verbalizes understanding.  Pt d/c'ed to home.

## 2011-06-20 NOTE — Progress Notes (Signed)
CARDIAC REHAB PHASE I   PRE:  Rate/Rhythm: 66 SR    BP: sitting 114/60    SaO2: 90-91 RA  MODE:  Ambulation: 460 ft   POST:  Rate/Rhythm: 87 SR    BP: sitting 115/72     SaO2: 89-93 RA  Tolerated well. SaO2 low but satisfactory. Gave walking guidelines and discussed Pulm rehab. Requests program to call her. [phone]   Sarah [name] CES, ACSM

## 2011-06-20 NOTE — Discharge Summary (Signed)
Patient ID: Sarah [name],  MRN: [id number], DOB/AGE: [birth year] 78 y.o.  Admit date: 06/18/2011 Discharge date: 06/20/2011  Primary Care Provider: Bufford Spikes, DO Primary Cardiologist: Katherina Right, MD  Discharge Diagnoses Principal Problem:  *Chest Pain with mild troponin elevation  **Elevated troponin without other objective evidence of ischemia -> medically managed  Active Problems:  HYPERTENSION  ASTHMA  Depression  Hypothyroidism  Arthritis   Allergies Allergies  Allergen Reactions  . Hydrocodone     Passed out, threw up, blood pressure dropped  . Clarithromycin     unknown  . Levofloxacin     Pass out  . Oxycodone     Rapid heart beat   Procedures  CT Angio of the Chest with contrast 06/19/2011  IMPRESSION:  1.  Stable areas of subsegmental atelectasis and scarring with bronchiectasis. 2.  New areas of tree in bud appearance most likely atypical infection (MAC). 3.  Stable mediastinal and hilar lymph nodes. 4.  No findings for pulmonary embolism. 5.  Stable hiatal hernia. _____________  2D Echocardiogram 06/19/2011  Study Conclusions  - Left ventricle: The cavity size was normal. Wall thickness   was normal. Systolic function was vigorous. The estimated   ejection fraction was in the range of 65% to 70%. - Mitral valve: Mild regurgitation. - Pulmonary arteries: Systolic pressure was mildly   increased. PA peak pressure: 35mm Hg (S). _____________  History of Present Illness  78 y/o female with the above problem list.  She has a h/o NSTEMI in 08/2010 @ which time she underwent CTA of the Chest (no PE) and also cardiac catheterization, which showed nonobstructive dzs.  She was placed on medical management.  She had been doing relatively well but on 06/18/2011, she developed non-radiating retrosternal chest pain associated with nausea and dyspnea.  Ss were similar to previous Ss in 08/2010 and EMS was activated.  She was taken to the [facility] ED where ECG was  non-acute however troponin was elevated.  CTA of the chest was performed and showed no evidence of PE but did suggest MAC.  She was admitted for further evaluation.  Hospital Course  Following admission, pt had no further chest pain.  Troponin's continued to rise and eventually peaked @ 0.62.  CK was normal with mild elevation of CK-MB to 5.5.  2D echo was performed and showed normal LV function.  As this presentation was similar to her presentation in 08/2010, @ which time she had relatively normal coronary arteries, no further ischemic evaluation was pursued.  In the setting of abnormal CTA of the chest, suggesting MAC, pulmonology was consulted.  CT was reviewed and findings were not felt to be the cause of her chest pain.  In the absence of leukocytosis/fever/dyspnea, pulmonology did not feel that she required antibiotics.  They did recommend BAL however pt wished to defer @ this time.  Pulmonology f/u has been arranged with a plan for f/u CT of the chest in 6 months.  Pt has been ambulating w/o recurrent symptoms and will be discharged home today in good condition.  We plan to have her f/u in 1 month @ which time we will obtain a troponin level to assess for chronic troponin elevation.  Discharge Vitals Blood pressure 95/61, pulse 70, temperature 98.3 F (36.8 C), temperature source Oral, resp. rate 20, height 5\' 7"  (1.702 m), weight 133 lb 12.8 oz (60.691 kg), last menstrual period 01/14/1970, SpO2 94.00%.  Filed Weights   06/19/11 0421 06/19/11 0429  Weight: 133 lb 9.6  oz (60.6 kg) 133 lb 12.8 oz (60.691 kg)   Labs  CBC  Basename 06/20/11 0005 06/19/11 0521 06/18/11 2010  WBC 7.5 7.6 --  NEUTROABS -- 4.6 8.2*  HGB 11.3* 12.5 --  HCT 35.6* 38.7 --  MCV 90.4 89.4 --  PLT 195 220 --   Basic Metabolic Panel  Basename 06/19/11 0521 06/18/11 2010  NA 134* 136  K 3.9 3.9  CL 101 102  CO2 23 23  GLUCOSE 92 152*  BUN 11 18  CREATININE 0.71 0.85  CALCIUM 9.1 8.8  MG -- --  PHOS  -- --   Liver Function Tests  [facility] 06/19/11 0521  AST 27  ALT 9  ALKPHOS 98  BILITOT 0.3  PROT 7.2  ALBUMIN 3.6   Cardiac Enzymes  Basename 06/19/11 1641 06/19/11 1000 06/19/11 0521  CKTOTAL 89 107 106  CKMB 4.4* 5.5* 4.8*  CKMBINDEX -- -- --  TROPONINI 0.62* 0.54* 0.41*   Thyroid Function Tests  Basename 06/19/11 0521  TSH 2.940  T4TOTAL --  T3FREE --  THYROIDAB --   Disposition  Pt is being discharged home today in good condition.  Follow-up Plans & Appointments  Follow-up Information    Follow up with Norma Fredrickson, NP on 07/22/2011. (10:00 - Dr. Harvie Bridge Nurse Practitioner.)    Contact information:   [address] [place] [address] [phone]       Follow up with Leslye Peer., MD on 07/29/2011. (1:30pm )    Contact information:   [address] [employer] [place] [address] [phone]         Discharge Medications  Medication List  As of 06/20/2011 11:32 AM   TAKE these medications         acetaminophen-codeine 300-30 MG per tablet   Commonly known as: TYLENOL #3   Take 1 tablet by mouth every 4 (four) hours as needed. For pain      albuterol 108 (90 BASE) MCG/ACT inhaler   Commonly known as: PROVENTIL HFA;VENTOLIN HFA   Inhale 2 puffs into the lungs every 6 (six) hours as needed. For shortness of breath      amLODipine 5 MG tablet   Commonly known as: NORVASC   Take 0.5 tablets (2.5 mg total) by mouth 2 (two) times daily.      aspirin 81 MG tablet   Take 81 mg by mouth daily.      citalopram 40 MG tablet   Commonly known as: CELEXA   Take 40 mg by mouth daily. 1/2 tablet daily      estradiol 0.05 MG/24HR   Commonly known as: VIVELLE-DOT   Place 1 patch onto the skin once a week. Apply on Thursday      fluticasone 50 MCG/ACT nasal spray   Commonly known as: FLONASE   Place 2 sprays into the nose daily. As needed      levothyroxine 50 MCG tablet   Commonly known  as: SYNTHROID, LEVOTHROID   Take 50 mcg by mouth daily.      lisinopril 5 MG tablet   Commonly known as: PRINIVIL,ZESTRIL   Take 0.5 tablets (2.5 mg total) by mouth 2 (two) times daily.      metoprolol tartrate 25 MG tablet   Commonly known as: LOPRESSOR   Take 0.5 tablets (12.5 mg total) by mouth 2 (two) times daily.      montelukast 10 MG tablet   Commonly known as: SINGULAIR   Take  10 mg by mouth at bedtime.      nitroGLYCERIN 0.4 MG SL tablet   Commonly known as: NITROSTAT   Place 1 tablet (0.4 mg total) under the tongue every 5 (five) minutes x 3 doses as needed for chest pain.      pregabalin 50 MG capsule   Commonly known as: LYRICA   Take 50 mg by mouth 2 (two) times daily.      propranolol 10 MG tablet   Commonly known as: INDERAL   Take 10 mg by mouth 3 (three) times daily with meals as needed. For heart per patient            Outstanding Labs/Studies  We will repeat a troponin in 1 month @ f/u visit to assess for chronic troponin elevation.  Duration of Discharge Encounter   Greater than 30 minutes including physician time.  Signed, Nicolasa Ducking NP 06/20/2011, 11:32 AM   Attending Note:   The patient was seen and examined.  Agree with assessment and plan as noted above.  See my note from earlier today.  Vesta Mixer, Montez Hageman., MD, [facility] 06/20/2011, 6:19 PM

## 2011-06-20 NOTE — Care Management Note (Signed)
    Page 1 of 2   06/20/2011     2:09:26 PM   CARE MANAGEMENT NOTE 06/20/2011  Patient:  Sarah [name], Sarah [name]   Account Number:  [id number]  Date Initiated:  06/19/2011  Documentation initiated by:  Donn Pierini  Subjective/Objective Assessment:   Pt admitted with c/p, elevated troponins     Action/Plan:   PTA pt lived at home with spouse,   Anticipated DC Date:  06/21/2011   Anticipated DC Plan:  HOME/SELF CARE      DC Planning Services  CM consult      [facility] Choice  HOME HEALTH   Choice offered to / List presented to:  C-1 Patient        HH arranged  HH-1 RN  HH-2 PT  HH-10 DISEASE MANAGEMENT      HH agency  [facility]   Status of service:  Completed, signed off Medicare Important Message given?   (If response is "NO", the following Medicare IM given date fields will be blank) Date Medicare IM given:   Date Additional Medicare IM given:    Discharge Disposition:  HOME W HOME HEALTH SERVICES  Per UR Regulation:  Reviewed for med. necessity/level of care/duration of stay  If discussed at Long Length of Stay Meetings, dates discussed:    Comments:  PCP- Reed  06-20-11 [address], RN,BSN [phone] CM made referral with [facility] for services. SOC to begin within 24-48 hours post d/c. PA placed orders for services, However did not place a face to face. MD Nahser was away from the office today and Ward Givens was unable to sifn face to face.   06-20-11 [address]  [place], [place] [phone] CM  spoke to family and they had a PCS provider in the room [facility]. Family did also want a HH RN and PT for safety evaluation. CM relayed information to RN in order for her to obtain order. CM provided the pt with choice list. Will continue to monitor for choice and order.  06/19/11- Donn Pierini RN, BSN [phone] UR completed

## 2011-06-20 NOTE — Progress Notes (Signed)
PROGRESS NOTE  Subjective:   Pt is admitted with chest pain.  Was admitted with identical symptoms previously and cardiac cath revealed no significant CAD.  CT angio was negative for PE but did show signs of early pneumonia.  She has chronic bronchiectasis.   Pt feels great.  Troponin continues to be elevated.    Objective:    Vital Signs:   Temp:  [97.5 F (36.4 C)-98.4 F (36.9 C)] 98.3 F (36.8 C) (06/06 0631) Pulse Rate:  [69-75] 70  (06/06 0631) Resp:  [18-20] 20  (06/06 0631) BP: (89-116)/(57-67) 96/61 mmHg (06/06 0631) SpO2:  [93 %-100 %] 94 % (06/06 0631)  Last BM Date: 06/19/11   24-hour weight change: Weight change:   Weight trends: Filed Weights   06/19/11 0421 06/19/11 0429  Weight: 133 lb 9.6 oz (60.6 kg) 133 lb 12.8 oz (60.691 kg)    Intake/Output:  06/05 0701 - 06/06 0700 In: 288.7 [P.O.:240; I.V.:48.7] Out: 1 [Urine:1]     Physical Exam: BP 96/61  Pulse 70  Temp(Src) 98.3 F (36.8 C) (Oral)  Resp 20  Ht 5\' 7"  (1.702 m)  Wt 133 lb 12.8 oz (60.691 kg)  BMI 20.96 kg/m2  SpO2 94%  LMP 01/14/1970  General: Vital signs reviewed and noted. Well-developed, well-nourished, in no acute distress; alert, appropriate and cooperative .  Head: Normocephalic, atraumatic.  Eyes: conjunctivae/corneas clear. PERRL, EOM's intact.   Throat: normal  Neck: Supple. Normal carotids. No JVD  Lungs:  Rales in both bases. ( chronic)  Heart: Regular rate,  With normal  S1 S2. No murmurs, gallops or rubs  Abdomen:  Soft, non-tender, non-distended with normoactive bowel sounds. No hepatomegaly. No rebound/guarding. No abdominal masses.  Extremities: Distal pedal pulses are 2+ .  No edema.    Neurologic: A&O X3, CN II - XII are grossly intact. Motor strength is 5/5 in the all 4 extremities.  Psych: Responds to questions appropriately with normal affect.    Labs: BMET:  Basename 06/19/11 0521 06/18/11 2010  NA 134* 136  K 3.9 3.9  CL 101 102  CO2 23 23    GLUCOSE 92 152*  BUN 11 18  CREATININE 0.71 0.85  CALCIUM 9.1 8.8  MG -- --  PHOS -- --    Liver function tests:  [facility] 06/19/11 0521  AST 27  ALT 9  ALKPHOS 98  BILITOT 0.3  PROT 7.2  ALBUMIN 3.6   No results found for this basename: LIPASE:2,AMYLASE:2 in the last 72 hours  CBC:  Basename 06/20/11 0005 06/19/11 0521 06/18/11 2010  WBC 7.5 7.6 --  NEUTROABS -- 4.6 8.2*  HGB 11.3* 12.5 --  HCT 35.6* 38.7 --  MCV 90.4 89.4 --  PLT 195 220 --    Cardiac Enzymes:  Basename 06/19/11 1641 06/19/11 1000 06/19/11 0521 06/19/11 0046  CKTOTAL 89 107 106 105  CKMB 4.4* 5.5* 4.8* 4.0  TROPONINI 0.62* 0.54* 0.41* 0.34*    Coagulation Studies:  Basename 06/19/11 0521  LABPROT 13.4  INR 1.00    Other:   Tele:  NSR  Medications:    Infusions:    . DISCONTD: heparin Stopped (06/19/11 0925)  . DISCONTD: nitroGLYCERIN Stopped (06/19/11 0925)    Scheduled Medications:    . amLODipine  2.5 mg Oral BID  . aspirin  81 mg Oral Daily  . citalopram  20 mg Oral Daily  . fluticasone  2 spray Each Nare Daily  . levothyroxine  50 mcg Oral Q breakfast  .  lisinopril  2.5 mg Oral BID  . metoprolol tartrate  12.5 mg Oral BID  . montelukast  10 mg Oral QHS  . pregabalin  50 mg Oral BID  . sodium chloride  3 mL Intravenous Q12H  . DISCONTD: citalopram  40 mg Oral Daily    Assessment/ Plan:    1. Community acquired pneumonia:  By CT scan.  Pulmonary will see as OP.  2. Elevated Troponin:  I do not think this is a NSTEMI.  Troponin can be elevated in multiple conditions.  I would like to draw a Troponin level the next time she is in the office to see if it is elevated at baseline. I have reviewed her cath films again.  No significant CAD.     Disposition: home today.  Add NTG 0.4 mg SL PRN to home meds. Continue other meds. Ambulate before DC.   I'll see her in 1 month.  Order a Troponin at the office visit.  I suspect that her Troponin levels are chronically  elevated.   Length of Stay: 2  Vesta Mixer, Montez Hageman., MD, [facility] 06/20/2011, 7:48 AM Office [phone] Pager [phone]

## 2011-06-20 NOTE — Discharge Instructions (Signed)
***  PLEASE REMEMBER TO BRING ALL OF YOUR MEDICATIONS TO EACH OF YOUR FOLLOW-UP OFFICE VISITS.  

## 2011-06-21 ENCOUNTER — Other Ambulatory Visit: Payer: Self-pay

## 2011-06-21 DIAGNOSIS — I214 Non-ST elevation (NSTEMI) myocardial infarction: Secondary | ICD-10-CM

## 2011-06-21 MED ORDER — LISINOPRIL 5 MG PO TABS: 2.5000 mg | ORAL_TABLET | Freq: Two times a day (BID) | ORAL | Status: DC

## 2011-06-22 DIAGNOSIS — I10 Essential (primary) hypertension: Secondary | ICD-10-CM | POA: Diagnosis not present

## 2011-06-22 DIAGNOSIS — J449 Chronic obstructive pulmonary disease, unspecified: Secondary | ICD-10-CM | POA: Diagnosis not present

## 2011-06-22 DIAGNOSIS — I4891 Unspecified atrial fibrillation: Secondary | ICD-10-CM | POA: Diagnosis not present

## 2011-06-22 DIAGNOSIS — F329 Major depressive disorder, single episode, unspecified: Secondary | ICD-10-CM | POA: Diagnosis not present

## 2011-06-22 DIAGNOSIS — M6281 Muscle weakness (generalized): Secondary | ICD-10-CM | POA: Diagnosis not present

## 2011-06-22 DIAGNOSIS — Z9181 History of falling: Secondary | ICD-10-CM | POA: Diagnosis not present

## 2011-06-22 DIAGNOSIS — N393 Stress incontinence (female) (male): Secondary | ICD-10-CM | POA: Diagnosis not present

## 2011-06-22 DIAGNOSIS — R079 Chest pain, unspecified: Secondary | ICD-10-CM | POA: Diagnosis not present

## 2011-06-22 DIAGNOSIS — G609 Hereditary and idiopathic neuropathy, unspecified: Secondary | ICD-10-CM | POA: Diagnosis not present

## 2011-06-24 DIAGNOSIS — G609 Hereditary and idiopathic neuropathy, unspecified: Secondary | ICD-10-CM | POA: Diagnosis not present

## 2011-06-24 DIAGNOSIS — M6281 Muscle weakness (generalized): Secondary | ICD-10-CM | POA: Diagnosis not present

## 2011-06-24 DIAGNOSIS — N393 Stress incontinence (female) (male): Secondary | ICD-10-CM | POA: Diagnosis not present

## 2011-06-24 DIAGNOSIS — I10 Essential (primary) hypertension: Secondary | ICD-10-CM | POA: Diagnosis not present

## 2011-06-24 DIAGNOSIS — J449 Chronic obstructive pulmonary disease, unspecified: Secondary | ICD-10-CM | POA: Diagnosis not present

## 2011-06-24 DIAGNOSIS — R079 Chest pain, unspecified: Secondary | ICD-10-CM | POA: Diagnosis not present

## 2011-06-26 ENCOUNTER — Other Ambulatory Visit: Payer: Self-pay | Admitting: *Deleted

## 2011-06-26 DIAGNOSIS — J449 Chronic obstructive pulmonary disease, unspecified: Secondary | ICD-10-CM | POA: Diagnosis not present

## 2011-06-26 DIAGNOSIS — M6281 Muscle weakness (generalized): Secondary | ICD-10-CM | POA: Diagnosis not present

## 2011-06-26 DIAGNOSIS — N393 Stress incontinence (female) (male): Secondary | ICD-10-CM | POA: Diagnosis not present

## 2011-06-26 DIAGNOSIS — G609 Hereditary and idiopathic neuropathy, unspecified: Secondary | ICD-10-CM | POA: Diagnosis not present

## 2011-06-26 DIAGNOSIS — R079 Chest pain, unspecified: Secondary | ICD-10-CM | POA: Diagnosis not present

## 2011-06-26 DIAGNOSIS — I10 Essential (primary) hypertension: Secondary | ICD-10-CM | POA: Diagnosis not present

## 2011-06-26 NOTE — Telephone Encounter (Signed)
Opened in Error.

## 2011-06-27 DIAGNOSIS — R079 Chest pain, unspecified: Secondary | ICD-10-CM | POA: Diagnosis not present

## 2011-06-27 DIAGNOSIS — M6281 Muscle weakness (generalized): Secondary | ICD-10-CM | POA: Diagnosis not present

## 2011-06-27 DIAGNOSIS — J449 Chronic obstructive pulmonary disease, unspecified: Secondary | ICD-10-CM | POA: Diagnosis not present

## 2011-06-27 DIAGNOSIS — N393 Stress incontinence (female) (male): Secondary | ICD-10-CM | POA: Diagnosis not present

## 2011-06-27 DIAGNOSIS — G609 Hereditary and idiopathic neuropathy, unspecified: Secondary | ICD-10-CM | POA: Diagnosis not present

## 2011-06-27 DIAGNOSIS — I10 Essential (primary) hypertension: Secondary | ICD-10-CM | POA: Diagnosis not present

## 2011-06-28 DIAGNOSIS — N393 Stress incontinence (female) (male): Secondary | ICD-10-CM | POA: Diagnosis not present

## 2011-06-28 DIAGNOSIS — M6281 Muscle weakness (generalized): Secondary | ICD-10-CM | POA: Diagnosis not present

## 2011-06-28 DIAGNOSIS — R079 Chest pain, unspecified: Secondary | ICD-10-CM | POA: Diagnosis not present

## 2011-06-28 DIAGNOSIS — I10 Essential (primary) hypertension: Secondary | ICD-10-CM | POA: Diagnosis not present

## 2011-06-28 DIAGNOSIS — G609 Hereditary and idiopathic neuropathy, unspecified: Secondary | ICD-10-CM | POA: Diagnosis not present

## 2011-06-28 DIAGNOSIS — J449 Chronic obstructive pulmonary disease, unspecified: Secondary | ICD-10-CM | POA: Diagnosis not present

## 2011-07-02 DIAGNOSIS — R079 Chest pain, unspecified: Secondary | ICD-10-CM | POA: Diagnosis not present

## 2011-07-02 DIAGNOSIS — G609 Hereditary and idiopathic neuropathy, unspecified: Secondary | ICD-10-CM | POA: Diagnosis not present

## 2011-07-02 DIAGNOSIS — M6281 Muscle weakness (generalized): Secondary | ICD-10-CM | POA: Diagnosis not present

## 2011-07-02 DIAGNOSIS — N393 Stress incontinence (female) (male): Secondary | ICD-10-CM | POA: Diagnosis not present

## 2011-07-02 DIAGNOSIS — J449 Chronic obstructive pulmonary disease, unspecified: Secondary | ICD-10-CM | POA: Diagnosis not present

## 2011-07-02 DIAGNOSIS — R5383 Other fatigue: Secondary | ICD-10-CM | POA: Diagnosis not present

## 2011-07-02 DIAGNOSIS — R5381 Other malaise: Secondary | ICD-10-CM | POA: Diagnosis not present

## 2011-07-02 DIAGNOSIS — I10 Essential (primary) hypertension: Secondary | ICD-10-CM | POA: Diagnosis not present

## 2011-07-02 DIAGNOSIS — I219 Acute myocardial infarction, unspecified: Secondary | ICD-10-CM | POA: Diagnosis not present

## 2011-07-03 DIAGNOSIS — I10 Essential (primary) hypertension: Secondary | ICD-10-CM | POA: Diagnosis not present

## 2011-07-03 DIAGNOSIS — M6281 Muscle weakness (generalized): Secondary | ICD-10-CM | POA: Diagnosis not present

## 2011-07-03 DIAGNOSIS — R079 Chest pain, unspecified: Secondary | ICD-10-CM | POA: Diagnosis not present

## 2011-07-03 DIAGNOSIS — G609 Hereditary and idiopathic neuropathy, unspecified: Secondary | ICD-10-CM | POA: Diagnosis not present

## 2011-07-03 DIAGNOSIS — N393 Stress incontinence (female) (male): Secondary | ICD-10-CM | POA: Diagnosis not present

## 2011-07-03 DIAGNOSIS — J449 Chronic obstructive pulmonary disease, unspecified: Secondary | ICD-10-CM | POA: Diagnosis not present

## 2011-07-08 ENCOUNTER — Emergency Department (HOSPITAL_COMMUNITY)
Admission: EM | Admit: 2011-07-08 | Discharge: 2011-07-09 | Disposition: A | Discharge status: Home / Self Care | Payer: Medicare Other | Attending: Emergency Medicine | Admitting: Emergency Medicine

## 2011-07-08 ENCOUNTER — Emergency Department (HOSPITAL_COMMUNITY): Payer: Medicare Other

## 2011-07-08 ENCOUNTER — Encounter (HOSPITAL_COMMUNITY): Payer: Self-pay

## 2011-07-08 DIAGNOSIS — R079 Chest pain, unspecified: Secondary | ICD-10-CM | POA: Insufficient documentation

## 2011-07-08 DIAGNOSIS — I519 Heart disease, unspecified: Secondary | ICD-10-CM | POA: Diagnosis not present

## 2011-07-08 DIAGNOSIS — R0789 Other chest pain: Secondary | ICD-10-CM | POA: Diagnosis not present

## 2011-07-08 DIAGNOSIS — I252 Old myocardial infarction: Secondary | ICD-10-CM | POA: Diagnosis not present

## 2011-07-08 DIAGNOSIS — E039 Hypothyroidism, unspecified: Secondary | ICD-10-CM | POA: Diagnosis not present

## 2011-07-08 DIAGNOSIS — G609 Hereditary and idiopathic neuropathy, unspecified: Secondary | ICD-10-CM | POA: Insufficient documentation

## 2011-07-08 DIAGNOSIS — I10 Essential (primary) hypertension: Secondary | ICD-10-CM | POA: Insufficient documentation

## 2011-07-08 DIAGNOSIS — R002 Palpitations: Secondary | ICD-10-CM | POA: Diagnosis not present

## 2011-07-08 DIAGNOSIS — I498 Other specified cardiac arrhythmias: Secondary | ICD-10-CM | POA: Diagnosis not present

## 2011-07-08 LAB — CBC
HCT: 38.2 % (ref 36.0–46.0)
Hemoglobin: 12 g/dL (ref 12.0–15.0)
MCH: 28.5 pg (ref 26.0–34.0)
MCHC: 31.4 g/dL (ref 30.0–36.0)
MCV: 90.7 fL (ref 78.0–100.0)
Platelets: 226 10*3/uL (ref 150–400)
RBC: 4.21 MIL/uL (ref 3.87–5.11)
RDW: 14.5 % (ref 11.5–15.5)
WBC: 8.5 10*3/uL (ref 4.0–10.5)

## 2011-07-08 LAB — URINALYSIS, ROUTINE W REFLEX MICROSCOPIC
Bilirubin Urine: NEGATIVE
Glucose, UA: NEGATIVE mg/dL
Hgb urine dipstick: NEGATIVE
Ketones, ur: 15 mg/dL — AB
Leukocytes, UA: NEGATIVE
Nitrite: NEGATIVE
Protein, ur: NEGATIVE mg/dL
Specific Gravity, Urine: 1.013 (ref 1.005–1.030)
Urobilinogen, UA: 0.2 mg/dL (ref 0.0–1.0)
pH: 7 (ref 5.0–8.0)

## 2011-07-08 LAB — BASIC METABOLIC PANEL
BUN: 19 mg/dL (ref 6–23)
CO2: 24 mEq/L (ref 19–32)
Calcium: 9.1 mg/dL (ref 8.4–10.5)
Chloride: 103 mEq/L (ref 96–112)
Creatinine, Ser: 0.95 mg/dL (ref 0.50–1.10)
GFR calc Af Amer: 60 mL/min — ABNORMAL LOW (ref >90)
GFR calc non Af Amer: 52 mL/min — ABNORMAL LOW (ref >90)
Glucose, Bld: 124 mg/dL — ABNORMAL HIGH (ref 70–99)
Potassium: 4.4 mEq/L (ref 3.5–5.1)
Sodium: 140 mEq/L (ref 135–145)

## 2011-07-08 LAB — TROPONIN I
Troponin I: <0.3 ng/mL (ref <0.30)
Troponin I: <0.3 ng/mL (ref <0.30)

## 2011-07-08 MED ORDER — MORPHINE SULFATE 4 MG/ML IJ SOLN: 4.0000 mg | Freq: Once | INTRAMUSCULAR | Status: DC

## 2011-07-08 MED ORDER — ONDANSETRON HCL 4 MG/2ML IJ SOLN: 4.0000 mg | Freq: Once | INTRAMUSCULAR | Status: DC

## 2011-07-08 MED ORDER — NITROGLYCERIN 0.4 MG SL SUBL: 0.4000 mg | SUBLINGUAL_TABLET | SUBLINGUAL | Status: DC | PRN

## 2011-07-08 NOTE — ED Notes (Signed)
Patient is resting comfortably. Warm blanket given.  No other needs voiced at this time.

## 2011-07-08 NOTE — ED Notes (Signed)
Pt reports aching in the central chest that has been coming and going.  Pt denies pain on arrival.  Pt rec'd 2 Nitro and 324 mg ASA.

## 2011-07-08 NOTE — ED Notes (Signed)
Pt states she does not need or want pain medicine at this time. Told pt to let me know as soon as her pain started again and I would be in to give it to her.

## 2011-07-08 NOTE — ED Provider Notes (Signed)
History     CSN: [id number]  Arrival date & time 07/08/11  4098   First MD Initiated Contact with Patient 07/08/11 1853      Chief Complaint  Patient presents with  . Chest Pain    (Consider location/radiation/quality/duration/timing/severity/associated sxs/prior treatment) HPI Patient presents emergency department with vague chest discomfort that she states began earlier this morning.  Patient, states she has had this in the past, that she was recently in the hospital with similar symptoms.  Patient, states, that she was followed by her primary doctor after her hospitalization.  Patient states, that she has some feelings of anxiety, along with shortness of breath.  States it is not really pain, more of an uneasy feeling and palpitations in her chest.  Patient denies abdominal pain, nausea, vomiting, diaphoresis, fever, cough, weakness, or dizziness.  Patient, states, that she felt lightheaded.  Past Medical History  Diagnosis Date  . Bronchiectasis     with history of Legionaires with chronic rales/rhonchi  . Pollen allergies   . Asthma   . Hypertension     Negative renal duplex in December of 2012  . Allergic rhinitis   . NSTEMI (non-ST elevated myocardial infarction)     a. Normal coronaries per cath August 2012 and negative CT angio for PE;  b. 06/2011 Repeat admission w/ chest pain and elevated troponin's, CTA Chest w/o PE  . Diastolic dysfunction     a. per echo 08/2010 with normal LV function;  b. 06/2011 Echo: EF 65-70%  . Idiopathic peripheral neuropathy   . Hypothyroidism   . Depression   . Shortness of breath   . Pneumonia     hx of pna  . H/O hiatal hernia   . Arthritis     Past Surgical History  Procedure Date  . Dilation and curettage of uterus   . Total abdominal hysterectomy w/ bilateral salpingoophorectomy 1972    leiomyomata, menorrhagia  . Appendectomy   . Cardiac catheterization August 2012    Normal coronaries.  . Tonsillectomy     Family History   Problem Relation Age of Onset  . Hypertension Mother     stroke  . Cancer Brother     prostrate    History  Substance Use Topics  . Smoking status: Never Smoker   . Smokeless tobacco: Never Used  . Alcohol Use: Yes     wine 1-2 week    OB History    Grav Para Term Preterm Abortions TAB SAB Ect Mult Living   2 2        2       Review of Systems All other systems negative except as documented in the HPI. All pertinent positives and negatives as reviewed in the HPI.  Allergies  Hydrocodone; Levofloxacin; Oxycodone; and Clarithromycin  Home Medications   Current Outpatient Rx  Name Route Sig Dispense Refill  . ACETAMINOPHEN-CODEINE #3 300-30 MG PO TABS Oral Take 1 tablet by mouth every 4 (four) hours as needed. For pain    . ALBUTEROL SULFATE HFA 108 (90 BASE) MCG/ACT IN AERS Inhalation Inhale 2 puffs into the lungs every 6 (six) hours as needed. For shortness of breath    . AMLODIPINE BESYLATE 5 MG PO TABS Oral Take 0.5 tablets (2.5 mg total) by mouth 2 (two) times daily. 30 tablet 11  . ASPIRIN EC 81 MG PO TBEC Oral Take 81 mg by mouth daily.    Marland Kitchen CITALOPRAM HYDROBROMIDE 40 MG PO TABS Oral Take 20 mg by  mouth daily.     Marland Kitchen ESTRADIOL 0.05 MG/24HR TD PTTW Transdermal Place 1 patch onto the skin once a week. Apply on Thursday    . FLUTICASONE PROPIONATE 50 MCG/ACT NA SUSP Nasal Place 2 sprays into the nose daily as needed. For congestion    . LEVOTHYROXINE SODIUM 50 MCG PO TABS Oral Take 50 mcg by mouth daily.      Marland Kitchen LISINOPRIL 5 MG PO TABS Oral Take 0.5 tablets (2.5 mg total) by mouth 2 (two) times daily. 60 tablet 5  . METOPROLOL TARTRATE 25 MG PO TABS Oral Take 0.5 tablets (12.5 mg total) by mouth 2 (two) times daily. 60 tablet 11  . MONTELUKAST SODIUM 10 MG PO TABS Oral Take 10 mg by mouth at bedtime.      Marland Kitchen NITROGLYCERIN 0.4 MG SL SUBL Sublingual Place 0.4 mg under the tongue every 5 (five) minutes as needed. For chest pain    . PREGABALIN 50 MG PO CAPS Oral Take 50 mg by  mouth 2 (two) times daily.       BP 142/70  Pulse 101  Temp 97.6 F (36.4 C) (Oral)  Resp 12  SpO2 97%  LMP 01/14/1970  Physical Exam  Nursing note and vitals reviewed. Constitutional: She is oriented to person, place, and time. She appears well-developed and well-nourished. No distress.  HENT:  Head: Normocephalic and atraumatic.  Mouth/Throat: Oropharynx is clear and moist.  Eyes: Pupils are equal, round, and reactive to light.  Neck: Normal range of motion. Neck supple.  Cardiovascular: Normal rate, regular rhythm and normal heart sounds.  Exam reveals no gallop and no friction rub.   No murmur heard. Pulmonary/Chest: Effort normal and breath sounds normal. No respiratory distress. She has no wheezes.  Neurological: She is alert and oriented to person, place, and time.  Skin: Skin is warm and dry. No rash noted.    ED Course  Procedures (including critical care time)  Labs Reviewed  BASIC METABOLIC PANEL - Abnormal; Notable for the following:    Glucose, Bld 124 (*)     GFR calc non Af Amer 52 (*)     GFR calc Af Amer 60 (*)     All other components within normal limits  URINALYSIS, ROUTINE W REFLEX MICROSCOPIC - Abnormal; Notable for the following:    APPearance CLOUDY (*)     Ketones, ur 15 (*)     All other components within normal limits  CBC  TROPONIN I  TROPONIN I   Dg Chest Port 1 View  07/08/2011  *RADIOLOGY REPORT*  Clinical Data: Chest pain  PORTABLE CHEST - 1 VIEW  Comparison: 06/20/2011  Findings: Cardiomegaly again noted.  No pulmonary edema.  Stable patchy scarring  or residual infiltrate in the lingula. No new infiltrate or focal consolidation.  IMPRESSION: No pulmonary edema.  Stable patchy scarring  or residual infiltrate in the lingula. No new infiltrate or focal consolidation.  Original Report Authenticated By: Natasha Mead, M.D.     Patient's chart his been reviewed in the patient had a full workup in the last 6 months plus a recent admission with  similar symptoms that she is having today.  I spoke with Dr. Charm Barges from [facility] and he agreed based on the findings and her HPI the patient could be closely followed up in the office.  Patient, states she has not had any pain here in the emergency Dept. except for one episode.  She felt a little funny in her chest  and more like a flutter.  Dr. Golda Acre saw the patient, as well, and felt that she did go home with close followup. MDM   Date: 07/08/2011  Rate: 89 Rhythm: normal sinus rhythm  QRS Axis: normal  Intervals: normal  ST/T Wave abnormalities: normal  Conduction Disutrbances:none  Narrative Interpretation:   Old EKG Reviewed: unchanged  Patient had an ECG that was shot when she felt some palpitations in the vague feeling in her chest showed sinus tachycardia./arrhythmia with PACs at a rate of 123.  Patient has been walking in the emergency department without symptomatology or shortness of breath.       Carlyle Dolly, PA-C 07/08/11 2355

## 2011-07-08 NOTE — ED Provider Notes (Signed)
Medical screening examination/treatment/procedure(s) were conducted as a shared visit with non-physician practitioner(s) and myself.  I personally evaluated the patient during the encounter  Patient presents with intermittent chest achiness that is similar to prior episodes patient has had admissions and evaluations for her.  Patient notably had a cardiac catheterization last August that was normal.  She's had recent echocardiograms that showed normal ejection fraction.  Patient is noted here to have some symptoms which were associated with some mild sinus tachycardia to a rate of around 120.  The EKGs do show some PACs but no other dysrhythmias.  Patient is feeling improved at this time.  If the patient is showing some improvement in his had thorough evaluations in the past I feel this patient would be appropriate for discharge home she's had 2 sets of cardiac markers to rule out MI.  We can contact cardiology regarding close followup later this week.  Nat Christen, MD 07/08/11 2227

## 2011-07-09 ENCOUNTER — Other Ambulatory Visit: Payer: Self-pay | Admitting: Cardiovascular Disease

## 2011-07-09 DIAGNOSIS — R079 Chest pain, unspecified: Secondary | ICD-10-CM | POA: Diagnosis not present

## 2011-07-09 DIAGNOSIS — I214 Non-ST elevation (NSTEMI) myocardial infarction: Secondary | ICD-10-CM

## 2011-07-09 DIAGNOSIS — M6281 Muscle weakness (generalized): Secondary | ICD-10-CM | POA: Diagnosis not present

## 2011-07-09 DIAGNOSIS — J449 Chronic obstructive pulmonary disease, unspecified: Secondary | ICD-10-CM | POA: Diagnosis not present

## 2011-07-09 DIAGNOSIS — G609 Hereditary and idiopathic neuropathy, unspecified: Secondary | ICD-10-CM | POA: Diagnosis not present

## 2011-07-09 DIAGNOSIS — I10 Essential (primary) hypertension: Secondary | ICD-10-CM | POA: Diagnosis not present

## 2011-07-09 DIAGNOSIS — N393 Stress incontinence (female) (male): Secondary | ICD-10-CM | POA: Diagnosis not present

## 2011-07-09 MED ORDER — LISINOPRIL 5 MG PO TABS: 2.5000 mg | ORAL_TABLET | Freq: Two times a day (BID) | ORAL | Status: DC

## 2011-07-09 NOTE — Discharge Instructions (Signed)
I spoke with [facility] and they will call you with an appointment for the next few days. Return here as needed. Your testing here today was normal.

## 2011-07-09 NOTE — ED Provider Notes (Signed)
Medical screening examination/treatment/procedure(s) were conducted as a shared visit with non-physician practitioner(s) and myself.  I personally evaluated the patient during the encounter   Doyne Ellinger A Tydarius Yawn, MD 07/09/11 0001 

## 2011-07-09 NOTE — Telephone Encounter (Signed)
Pt needs refill on Lisinopril and they gave her an emergency supply but she is almost out and needs more sent to [facility] on [address] and I did confirm pharm

## 2011-07-09 NOTE — ED Notes (Signed)
Pt discharged home with family in good condition. 

## 2011-07-09 NOTE — Telephone Encounter (Signed)
Pt needs autherization for a refill on Lisinopril this is second call

## 2011-07-10 ENCOUNTER — Other Ambulatory Visit: Payer: Self-pay | Admitting: *Deleted

## 2011-07-10 DIAGNOSIS — R002 Palpitations: Secondary | ICD-10-CM | POA: Diagnosis not present

## 2011-07-10 DIAGNOSIS — M6281 Muscle weakness (generalized): Secondary | ICD-10-CM | POA: Diagnosis not present

## 2011-07-10 DIAGNOSIS — G609 Hereditary and idiopathic neuropathy, unspecified: Secondary | ICD-10-CM | POA: Diagnosis not present

## 2011-07-10 DIAGNOSIS — R079 Chest pain, unspecified: Secondary | ICD-10-CM | POA: Diagnosis not present

## 2011-07-10 DIAGNOSIS — I10 Essential (primary) hypertension: Secondary | ICD-10-CM | POA: Diagnosis not present

## 2011-07-10 DIAGNOSIS — N393 Stress incontinence (female) (male): Secondary | ICD-10-CM | POA: Diagnosis not present

## 2011-07-10 DIAGNOSIS — I214 Non-ST elevation (NSTEMI) myocardial infarction: Secondary | ICD-10-CM

## 2011-07-10 DIAGNOSIS — J449 Chronic obstructive pulmonary disease, unspecified: Secondary | ICD-10-CM | POA: Diagnosis not present

## 2011-07-10 MED ORDER — LISINOPRIL 5 MG PO TABS: 2.5000 mg | ORAL_TABLET | Freq: Two times a day (BID) | ORAL | Status: DC

## 2011-07-10 NOTE — Telephone Encounter (Signed)
Fax Received. Refill Completed. Annesha Delgreco Chowoe (R.M.A)   

## 2011-07-13 DIAGNOSIS — G609 Hereditary and idiopathic neuropathy, unspecified: Secondary | ICD-10-CM | POA: Diagnosis not present

## 2011-07-13 DIAGNOSIS — R079 Chest pain, unspecified: Secondary | ICD-10-CM | POA: Diagnosis not present

## 2011-07-13 DIAGNOSIS — M6281 Muscle weakness (generalized): Secondary | ICD-10-CM | POA: Diagnosis not present

## 2011-07-13 DIAGNOSIS — J449 Chronic obstructive pulmonary disease, unspecified: Secondary | ICD-10-CM | POA: Diagnosis not present

## 2011-07-13 DIAGNOSIS — I10 Essential (primary) hypertension: Secondary | ICD-10-CM | POA: Diagnosis not present

## 2011-07-13 DIAGNOSIS — N393 Stress incontinence (female) (male): Secondary | ICD-10-CM | POA: Diagnosis not present

## 2011-07-22 ENCOUNTER — Other Ambulatory Visit (INDEPENDENT_AMBULATORY_CARE_PROVIDER_SITE_OTHER): Payer: Medicare Other

## 2011-07-22 ENCOUNTER — Encounter: Payer: Self-pay | Admitting: Nurse Practitioner

## 2011-07-22 ENCOUNTER — Ambulatory Visit (INDEPENDENT_AMBULATORY_CARE_PROVIDER_SITE_OTHER): Payer: Medicare Other | Admitting: Nurse Practitioner

## 2011-07-22 VITALS — BP 110/68 | HR 66 | Ht 67.5 in | Wt 134.4 lb

## 2011-07-22 DIAGNOSIS — I214 Non-ST elevation (NSTEMI) myocardial infarction: Secondary | ICD-10-CM | POA: Diagnosis not present

## 2011-07-22 DIAGNOSIS — R0989 Other specified symptoms and signs involving the circulatory and respiratory systems: Secondary | ICD-10-CM

## 2011-07-22 DIAGNOSIS — R072 Precordial pain: Secondary | ICD-10-CM

## 2011-07-22 DIAGNOSIS — R0789 Other chest pain: Secondary | ICD-10-CM

## 2011-07-22 LAB — HEPATIC FUNCTION PANEL
ALT: 10 U/L (ref 0–35)
AST: 22 U/L (ref 0–37)
Albumin: 3.9 g/dL (ref 3.5–5.2)
Total Bilirubin: 0.4 mg/dL (ref 0.3–1.2)
Total Protein: 7.6 g/dL (ref 6.0–8.3)

## 2011-07-22 LAB — BASIC METABOLIC PANEL
Calcium: 8.8 mg/dL (ref 8.4–10.5)
GFR: 55.55 mL/min — ABNORMAL LOW (ref >60.00)
Glucose, Bld: 92 mg/dL (ref 70–99)
Potassium: 4.4 mEq/L (ref 3.5–5.1)
Sodium: 137 mEq/L (ref 135–145)

## 2011-07-22 LAB — LIPID PANEL
HDL: 77.4 mg/dL (ref >39.00)
Triglycerides: 54 mg/dL (ref 0.0–149.0)
VLDL: 10.8 mg/dL (ref 0.0–40.0)

## 2011-07-22 MED ORDER — ISOSORBIDE MONONITRATE ER 30 MG PO TB24: 30.0000 mg | ORAL_TABLET | Freq: Every day | ORAL | Status: DC

## 2011-07-22 NOTE — Patient Instructions (Addendum)
We are going to check some lab work today  I am going to start you on Imdur 30 mg for your chest pain and shortness of breath. Take each night before bedtime  I will see you next week with Dr. Elease Hashimoto.  Call the [facility] office at [phone] if you have any questions, problems or concerns.

## 2011-07-22 NOTE — Assessment & Plan Note (Signed)
She continues with episodes of chest pain/discomfort with associated symptoms. She is using NTG. Her cath back in August was basically normal. She does have diastolic dysfunction. She is on beta blocker, ACE and CCB therapy. I have added Imdur 30 mg daily. I cautioned her about headaches. I have also asked her to touch base with her PCP regarding possible anxiety/panic attacks. Dr. Elease Hashimoto and I will see her back next week. Patient is agreeable to this plan and will call if any problems develop in the interim.

## 2011-07-22 NOTE — Progress Notes (Signed)
Sarah [name] Date of Birth: 10-25-1921 Medical Record #[id number]  History of Present Illness: Sarah [name] is seen back today for a post hospital visit. She is seen for Dr. Elease Hashimoto. She is 78 years old. Has HTN, asthma, depression, OA, and hypothyroidism. Had a NSTEMI back in August of 2012 with cardiac cath showing no obstructive disease at that time and a negative CT for PE. She was medically managed. She developed recurrent symptoms a month ago and was back in the hospital. Her CT was again negative for PE but did show MAC. Pulmonary was consulted and they did not feel that she required antibiotics. They did recommend BAL however, the patient wished to defer at this time and she has planned follow up with pulmonary next week.  Troponins were up but based on her cath data from August showing relatively normal coronaries, cardiac cath was not repeated. There is concern for chronically elevated troponins.  She comes in today. She is here alone. She is still not doing well. Has really felt bad over the past 2 weeks. Still with episodes of chest pain, shortness of breath and extreme fatigue. Has a feeling of impending doom. She has been using some NTG with relief. She takes her albuterol and doubles up on her cardiac medicines. Her family thinks she is having anxiety attacks but she doesn't think so. She is tolerating her medicines. Has had a long standing history of depression. Has not seen her PCP in quite some time. She is quite frustrated that she does not feel better and says it is interfering with her quality of life. Blood pressure seems to be doing ok for the most part.   She had an event monitor back in January that showed PACs and PVCs. No significant arrhythmia. She has had a negative renal duplex for RAS.   Current Outpatient Prescriptions on File Prior to Visit  Medication Sig Dispense Refill  . acetaminophen-codeine (TYLENOL #3) 300-30 MG per tablet Take 1 tablet by mouth every 4 (four)  hours as needed. For pain      . albuterol (PROVENTIL HFA;VENTOLIN HFA) 108 (90 BASE) MCG/ACT inhaler Inhale 2 puffs into the lungs every 6 (six) hours as needed. For shortness of breath      . amLODipine (NORVASC) 5 MG tablet Take 0.5 tablets (2.5 mg total) by mouth 2 (two) times daily.  30 tablet  11  . aspirin EC 81 MG tablet Take 81 mg by mouth daily.      . citalopram (CELEXA) 40 MG tablet Take 20 mg by mouth daily.       Marland Kitchen estradiol (VIVELLE-DOT) 0.05 MG/24HR Place 1 patch onto the skin once a week. Apply on Thursday      . fluticasone (FLONASE) 50 MCG/ACT nasal spray Place 2 sprays into the nose daily as needed. For congestion      . levothyroxine (SYNTHROID, LEVOTHROID) 50 MCG tablet Take 50 mcg by mouth daily.        Marland Kitchen lisinopril (PRINIVIL,ZESTRIL) 5 MG tablet Take 0.5 tablets (2.5 mg total) by mouth 2 (two) times daily.  60 tablet  5  . metoprolol tartrate (LOPRESSOR) 25 MG tablet Take 0.5 tablets (12.5 mg total) by mouth 2 (two) times daily.  60 tablet  11  . montelukast (SINGULAIR) 10 MG tablet Take 10 mg by mouth at bedtime.        . nitroGLYCERIN (NITROSTAT) 0.4 MG SL tablet Place 0.4 mg under the tongue every 5 (five) minutes as needed. For  chest pain      . pregabalin (LYRICA) 50 MG capsule Take 50 mg by mouth 2 (two) times daily.         Allergies  Allergen Reactions  . Hydrocodone Nausea And Vomiting and Other (See Comments)    Passed out, threw up, blood pressure dropped  . Levofloxacin Other (See Comments)    Pass out  . Oxycodone Other (See Comments)    Rapid heart beat  . Clarithromycin Other (See Comments)    unknown    Past Medical History  Diagnosis Date  . Bronchiectasis     with history of Legionaires with chronic rales/rhonchi  . Pollen allergies   . Asthma   . Hypertension     Negative renal duplex in December of 2012  . Allergic rhinitis   . NSTEMI (non-ST elevated myocardial infarction)     a. Normal coronaries per cath August 2012 and negative CT  angio for PE;  b. 06/2011 Repeat admission w/ chest pain and elevated troponin's, CTA Chest w/o PE  . Diastolic dysfunction     a. per echo 08/2010 with normal LV function;  b. 06/2011 Echo: EF 65-70%  . Idiopathic peripheral neuropathy   . Hypothyroidism   . Depression   . Shortness of breath   . Pneumonia     hx of pna  . H/O hiatal hernia   . Arthritis     Past Surgical History  Procedure Date  . Dilation and curettage of uterus   . Total abdominal hysterectomy w/ bilateral salpingoophorectomy 1972    leiomyomata, menorrhagia  . Appendectomy   . Cardiac catheterization August 2012    Normal coronaries.  . Tonsillectomy     History  Smoking status  . Never Smoker   Smokeless tobacco  . Never Used    History  Alcohol Use  . Yes    wine 1-2 week    Family History  Problem Relation Age of Onset  . Hypertension Mother     stroke  . Cancer Brother     prostrate    Review of Systems: The review of systems is per the HPI.  All other systems were reviewed and are negative.  Physical Exam: BP 110/68  Pulse 66  Ht 5' 7.5" (1.715 m)  Wt 134 lb 6.4 oz (60.963 kg)  BMI 20.74 kg/m2  SpO2 94%  LMP 01/14/1970 Patient is very pleasant and in no acute distress. She does seem frustrated and more fatigued today. Skin is warm and dry. Color is normal.  HEENT is unremarkable. Normocephalic/atraumatic. PERRL. Sclera are nonicteric. Neck is supple. No masses. No JVD. Lungs are clear. Cardiac exam shows a regular rate and rhythm. Abdomen is soft. Extremities are without edema. Gait and ROM are intact. No gross neurologic deficits noted.  LABORATORY DATA: Repeat troponin is pending.  Lab Results  Component Value Date   WBC 8.5 07/08/2011   HGB 12.0 07/08/2011   HCT 38.2 07/08/2011   PLT 226 07/08/2011   GLUCOSE 124* 07/08/2011   CHOL 181 09/06/2010   TRIG 50 09/06/2010   HDL 68 09/06/2010   LDLCALC 103* 09/06/2010   ALT 9 06/19/2011   AST 27 06/19/2011   NA 140 07/08/2011   K 4.4  07/08/2011   CL 103 07/08/2011   CREATININE 0.95 07/08/2011   BUN 19 07/08/2011   CO2 24 07/08/2011   TSH 2.940 06/19/2011   INR 1.00 06/19/2011   HGBA1C 6.1* 09/06/2010   Lab Results  Component Value  Date   CKTOTAL 89 06/19/2011   CKMB 4.4* 06/19/2011   TROPONINI <0.30 07/08/2011   Assessment / Plan:

## 2011-07-23 ENCOUNTER — Other Ambulatory Visit: Payer: Self-pay | Admitting: Internal Medicine

## 2011-07-23 DIAGNOSIS — J449 Chronic obstructive pulmonary disease, unspecified: Secondary | ICD-10-CM | POA: Diagnosis not present

## 2011-07-23 DIAGNOSIS — M6281 Muscle weakness (generalized): Secondary | ICD-10-CM | POA: Diagnosis not present

## 2011-07-23 DIAGNOSIS — Z1231 Encounter for screening mammogram for malignant neoplasm of breast: Secondary | ICD-10-CM

## 2011-07-23 DIAGNOSIS — G609 Hereditary and idiopathic neuropathy, unspecified: Secondary | ICD-10-CM | POA: Diagnosis not present

## 2011-07-23 DIAGNOSIS — R079 Chest pain, unspecified: Secondary | ICD-10-CM | POA: Diagnosis not present

## 2011-07-23 DIAGNOSIS — I10 Essential (primary) hypertension: Secondary | ICD-10-CM | POA: Diagnosis not present

## 2011-07-23 DIAGNOSIS — N393 Stress incontinence (female) (male): Secondary | ICD-10-CM | POA: Diagnosis not present

## 2011-07-24 ENCOUNTER — Telehealth: Payer: Self-pay | Admitting: *Deleted

## 2011-07-24 DIAGNOSIS — E785 Hyperlipidemia, unspecified: Secondary | ICD-10-CM

## 2011-07-24 MED ORDER — SIMVASTATIN 20 MG PO TABS: 20.0000 mg | ORAL_TABLET | Freq: Every evening | ORAL | Status: DC

## 2011-07-24 NOTE — Telephone Encounter (Signed)
Pt agreed to new med, lab set for 3 months,

## 2011-07-24 NOTE — Telephone Encounter (Signed)
Message copied by Antony Odea on Wed Jul 24, 2011  3:16 PM ------      Message from: Vesta Mixer      Created: Mon Jul 22, 2011  6:06 PM       She has normal coronary arteries by cardiac catheterization despite having a non-ST segment elevation myocardial portion. Her goal LDL should be lower than 100. Start simvastatin 20 mg a day. Check fasting labs in 3 months.

## 2011-07-27 ENCOUNTER — Encounter (HOSPITAL_BASED_OUTPATIENT_CLINIC_OR_DEPARTMENT_OTHER): Payer: Self-pay | Admitting: *Deleted

## 2011-07-27 ENCOUNTER — Emergency Department (HOSPITAL_BASED_OUTPATIENT_CLINIC_OR_DEPARTMENT_OTHER)
Admission: EM | Admit: 2011-07-27 | Discharge: 2011-07-27 | Disposition: A | Discharge status: Home / Self Care | Payer: Medicare Other | Attending: Emergency Medicine | Admitting: Emergency Medicine

## 2011-07-27 ENCOUNTER — Emergency Department (HOSPITAL_BASED_OUTPATIENT_CLINIC_OR_DEPARTMENT_OTHER): Payer: Medicare Other

## 2011-07-27 DIAGNOSIS — R5381 Other malaise: Secondary | ICD-10-CM | POA: Diagnosis not present

## 2011-07-27 DIAGNOSIS — I252 Old myocardial infarction: Secondary | ICD-10-CM | POA: Diagnosis not present

## 2011-07-27 DIAGNOSIS — R509 Fever, unspecified: Secondary | ICD-10-CM | POA: Diagnosis not present

## 2011-07-27 DIAGNOSIS — R059 Cough, unspecified: Secondary | ICD-10-CM | POA: Diagnosis not present

## 2011-07-27 DIAGNOSIS — R350 Frequency of micturition: Secondary | ICD-10-CM | POA: Diagnosis not present

## 2011-07-27 DIAGNOSIS — R5383 Other fatigue: Secondary | ICD-10-CM | POA: Insufficient documentation

## 2011-07-27 DIAGNOSIS — R079 Chest pain, unspecified: Secondary | ICD-10-CM | POA: Diagnosis not present

## 2011-07-27 DIAGNOSIS — R0609 Other forms of dyspnea: Secondary | ICD-10-CM | POA: Insufficient documentation

## 2011-07-27 DIAGNOSIS — F329 Major depressive disorder, single episode, unspecified: Secondary | ICD-10-CM | POA: Insufficient documentation

## 2011-07-27 DIAGNOSIS — E039 Hypothyroidism, unspecified: Secondary | ICD-10-CM | POA: Diagnosis not present

## 2011-07-27 DIAGNOSIS — F3289 Other specified depressive episodes: Secondary | ICD-10-CM | POA: Insufficient documentation

## 2011-07-27 DIAGNOSIS — I10 Essential (primary) hypertension: Secondary | ICD-10-CM | POA: Insufficient documentation

## 2011-07-27 DIAGNOSIS — R110 Nausea: Secondary | ICD-10-CM | POA: Diagnosis not present

## 2011-07-27 DIAGNOSIS — R05 Cough: Secondary | ICD-10-CM | POA: Diagnosis not present

## 2011-07-27 DIAGNOSIS — J45909 Unspecified asthma, uncomplicated: Secondary | ICD-10-CM | POA: Insufficient documentation

## 2011-07-27 DIAGNOSIS — R0989 Other specified symptoms and signs involving the circulatory and respiratory systems: Secondary | ICD-10-CM | POA: Insufficient documentation

## 2011-07-27 DIAGNOSIS — M129 Arthropathy, unspecified: Secondary | ICD-10-CM | POA: Diagnosis not present

## 2011-07-27 DIAGNOSIS — R0600 Dyspnea, unspecified: Secondary | ICD-10-CM

## 2011-07-27 DIAGNOSIS — Z79899 Other long term (current) drug therapy: Secondary | ICD-10-CM | POA: Insufficient documentation

## 2011-07-27 DIAGNOSIS — Z7982 Long term (current) use of aspirin: Secondary | ICD-10-CM | POA: Insufficient documentation

## 2011-07-27 DIAGNOSIS — J449 Chronic obstructive pulmonary disease, unspecified: Secondary | ICD-10-CM | POA: Diagnosis not present

## 2011-07-27 LAB — CBC WITH DIFFERENTIAL/PLATELET
Basophils Absolute: 0 10*3/uL (ref 0.0–0.1)
Basophils Relative: 1 % (ref 0–1)
HCT: 37.3 % (ref 36.0–46.0)
Lymphocytes Relative: 16 % (ref 12–46)
MCHC: 33 g/dL (ref 30.0–36.0)
Monocytes Absolute: 0.7 10*3/uL (ref 0.1–1.0)
Neutro Abs: 4.6 10*3/uL (ref 1.7–7.7)
Platelets: 246 10*3/uL (ref 150–400)
RDW: 14 % (ref 11.5–15.5)
WBC: 6.4 10*3/uL (ref 4.0–10.5)

## 2011-07-27 LAB — URINALYSIS, ROUTINE W REFLEX MICROSCOPIC
Glucose, UA: NEGATIVE mg/dL
Ketones, ur: NEGATIVE mg/dL
Leukocytes, UA: NEGATIVE
Nitrite: NEGATIVE
Protein, ur: NEGATIVE mg/dL
Urobilinogen, UA: 0.2 mg/dL (ref 0.0–1.0)

## 2011-07-27 LAB — TROPONIN I: Troponin I: <0.3 ng/mL (ref <0.30)

## 2011-07-27 LAB — COMPREHENSIVE METABOLIC PANEL
ALT: 8 U/L (ref 0–35)
AST: 23 U/L (ref 0–37)
Albumin: 4.2 g/dL (ref 3.5–5.2)
CO2: 25 mEq/L (ref 19–32)
Calcium: 9.4 mg/dL (ref 8.4–10.5)
Chloride: 101 mEq/L (ref 96–112)
Creatinine, Ser: 0.9 mg/dL (ref 0.50–1.10)
Sodium: 139 mEq/L (ref 135–145)
Total Bilirubin: 0.2 mg/dL — ABNORMAL LOW (ref 0.3–1.2)

## 2011-07-27 MED ORDER — PREDNISONE 10 MG PO TABS
60.0000 mg | ORAL_TABLET | ORAL | Status: AC
  Administered 2011-07-27: 60 mg via ORAL
  Filled 2011-07-27: qty 1

## 2011-07-27 MED ORDER — ALBUTEROL SULFATE (5 MG/ML) 0.5% IN NEBU
5.0000 mg | INHALATION_SOLUTION | RESPIRATORY_TRACT | Status: AC
  Administered 2011-07-27: 5 mg via RESPIRATORY_TRACT
  Filled 2011-07-27: qty 1

## 2011-07-27 MED ORDER — PREDNISONE 20 MG PO TABS: 60.0000 mg | ORAL_TABLET | Freq: Every day | ORAL | Status: AC

## 2011-07-27 MED ORDER — SULFAMETHOXAZOLE-TRIMETHOPRIM 800-160 MG PO TABS: 1.0000 | ORAL_TABLET | Freq: Two times a day (BID) | ORAL | Status: AC

## 2011-07-27 MED ORDER — SODIUM CHLORIDE 0.9 % IV BOLUS (SEPSIS)
500.0000 mL | Freq: Once | INTRAVENOUS | Status: AC
  Administered 2011-07-27: 500 mL via INTRAVENOUS

## 2011-07-27 NOTE — ED Notes (Signed)
Pt presents to ED today without indwelling IV catheter.  Not charted as removed from previous EPIC encounter.  

## 2011-07-27 NOTE — ED Notes (Signed)
Family out to nurses station to discuss POC.  Advised will need to speak with Dr Jeraldine Loots and will update pt and family

## 2011-07-27 NOTE — ED Notes (Signed)
MD at bedside. 

## 2011-07-27 NOTE — ED Notes (Signed)
Pt presents to ED today with cough for the last 24hours.  Pt reports dry and non-productive.  Pt not coughing at present.  Pt very concerned about possible pneumonia due to hx of same.

## 2011-07-27 NOTE — ED Provider Notes (Signed)
History   This chart was scribed for Gerhard Munch, MD by Shari Heritage. The patient was seen in room MH08/MH08. Patient's care was started at 1441.     CSN: [id number]  Arrival date & time 07/27/11  1441   First MD Initiated Contact with Patient 07/27/11 1508      Chief Complaint  Patient presents with  . Cough    (Consider location/radiation/quality/duration/timing/severity/associated sxs/prior treatment) Patient is a 78 y.o. female presenting with cough. The history is provided by the patient and a relative. No language interpreter was used.  Cough This is a new problem. The current episode started yesterday. The problem occurs hourly. The problem has not changed since onset.The cough is non-productive. The maximum temperature recorded prior to her arrival was 100 to 100.9 F. The fever has been present for less than 1 day. Associated symptoms include chest pain and chills. Pertinent negatives include no sweats, no weight loss, no ear congestion, no ear pain, no headaches, no rhinorrhea, no sore throat, no myalgias, no shortness of breath, no wheezing and no eye redness. Treatments tried: Albuterol inhaler. The treatment provided mild relief. She is not a smoker. Her past medical history is significant for pneumonia, bronchiectasis and asthma.   Sarah [name] is a 78 y.o. female who presents to the Emergency Department complaining of waxing and waning, nonproductive cough onset 24 hours ago with associated symptoms of weakness, chest pain, nausea, chills, slight fever, frequency of urination. Patient says that symptoms came on suddenly and then began to plateau. Patient says that she feels "distressed" and feels generally ill. Patient says she has normal kidneys and liver. Patient denies loss of bowel or bladder control. Patient had been taking Nitroglycerin after NSTEMI, but she says that it makes her feel poorly. Patient uses an Albuterol inhaler as needed. Patient hasn't had any recent  steroid treatments. Patient with h/o frequent pneumonia, Legionnaire's disease (30+ years ago), bronchiectasis, asthma, HTN, NSTEMI, diastolic dysfunction, hypothyroidism, arthritis and hiatal hernia. Patient with surgical h/o cardiac catheterization (August 2012).   Past Medical History  Diagnosis Date  . Bronchiectasis     with history of Legionaires with chronic rales/rhonchi  . Pollen allergies   . Asthma   . Hypertension     Negative renal duplex in December of 2012  . Allergic rhinitis   . NSTEMI (non-ST elevated myocardial infarction)     a. Normal coronaries per cath August 2012 and negative CT angio for PE;  b. 06/2011 Repeat admission w/ chest pain and elevated troponin's, CTA Chest w/o PE  . Diastolic dysfunction     a. per echo 08/2010 with normal LV function;  b. 06/2011 Echo: EF 65-70%  . Idiopathic peripheral neuropathy   . Hypothyroidism   . Depression   . Shortness of breath   . Pneumonia     hx of pna  . H/O hiatal hernia   . Arthritis     Past Surgical History  Procedure Date  . Dilation and curettage of uterus   . Total abdominal hysterectomy w/ bilateral salpingoophorectomy 1972    leiomyomata, menorrhagia  . Appendectomy   . Cardiac catheterization August 2012    Normal coronaries.  . Tonsillectomy     Family History  Problem Relation Age of Onset  . Hypertension Mother     stroke  . Cancer Brother     prostrate    History  Substance Use Topics  . Smoking status: Never Smoker   . Smokeless tobacco: Never Used  .  Alcohol Use: Yes     wine 1-2 week    OB History    Grav Para Term Preterm Abortions TAB SAB Ect Mult Living   2 2        2       Review of Systems  Constitutional: Positive for chills. Negative for weight loss.  HENT: Negative for ear pain, sore throat and rhinorrhea.   Eyes: Negative for redness.  Respiratory: Positive for cough. Negative for shortness of breath and wheezing.   Cardiovascular: Positive for chest pain.    Gastrointestinal: Positive for nausea.  Genitourinary: Positive for frequency.  Musculoskeletal: Negative for myalgias.  Skin: Negative for pallor.  Neurological: Negative for headaches.  Psychiatric/Behavioral: Negative for confusion.    Allergies  Hydrocodone; Levofloxacin; Oxycodone; and Clarithromycin  Home Medications   Current Outpatient Rx  Name Route Sig Dispense Refill  . ACETAMINOPHEN-CODEINE #3 300-30 MG PO TABS Oral Take 1 tablet by mouth every 4 (four) hours as needed. For pain    . ALBUTEROL SULFATE HFA 108 (90 BASE) MCG/ACT IN AERS Inhalation Inhale 2 puffs into the lungs every 6 (six) hours as needed. For shortness of breath    . AMLODIPINE BESYLATE 5 MG PO TABS Oral Take 0.5 tablets (2.5 mg total) by mouth 2 (two) times daily. 30 tablet 11  . ASPIRIN EC 81 MG PO TBEC Oral Take 81 mg by mouth daily.    Marland Kitchen CITALOPRAM HYDROBROMIDE 40 MG PO TABS Oral Take 20 mg by mouth daily.     Marland Kitchen ESTRADIOL 0.05 MG/24HR TD PTTW Transdermal Place 1 patch onto the skin once a week. Apply on Thursday    . FLUTICASONE PROPIONATE 50 MCG/ACT NA SUSP Nasal Place 2 sprays into the nose daily as needed. For congestion    . ISOSORBIDE MONONITRATE ER 30 MG PO TB24 Oral Take 1 tablet (30 mg total) by mouth daily. 30 tablet 11  . LEVOTHYROXINE SODIUM 50 MCG PO TABS Oral Take 50 mcg by mouth daily.      Marland Kitchen LISINOPRIL 5 MG PO TABS Oral Take 0.5 tablets (2.5 mg total) by mouth 2 (two) times daily. 60 tablet 5  . METOPROLOL TARTRATE 25 MG PO TABS Oral Take 0.5 tablets (12.5 mg total) by mouth 2 (two) times daily. 60 tablet 11  . MONTELUKAST SODIUM 10 MG PO TABS Oral Take 10 mg by mouth at bedtime.      Marland Kitchen NITROGLYCERIN 0.4 MG SL SUBL Sublingual Place 0.4 mg under the tongue every 5 (five) minutes as needed. For chest pain    . PREGABALIN 50 MG PO CAPS Oral Take 50 mg by mouth 2 (two) times daily.     Marland Kitchen SIMVASTATIN 20 MG PO TABS Oral Take 1 tablet (20 mg total) by mouth every evening. 30 tablet 5    BP  176/71  Pulse 84  Temp 97.6 F (36.4 C) (Oral)  Resp 20  Ht 5' 7.5" (1.715 m)  Wt 134 lb (60.782 kg)  BMI 20.68 kg/m2  SpO2 95%  LMP 01/14/1970  Physical Exam  Nursing note and vitals reviewed. Constitutional: She is oriented to person, place, and time. She appears well-developed and well-nourished. No distress.  HENT:  Head: Normocephalic and atraumatic.  Eyes: Conjunctivae and EOM are normal.  Cardiovascular: Normal rate and regular rhythm.   Pulmonary/Chest: Effort normal and breath sounds normal. No stridor. No respiratory distress.  Abdominal: She exhibits no distension.  Musculoskeletal: She exhibits no edema.  Neurological: She is alert and oriented to  person, place, and time. No cranial nerve deficit.  Skin: Skin is warm and dry.  Psychiatric: She has a normal mood and affect.    ED Course  Procedures (including critical care time) DIAGNOSTIC STUDIES: Oxygen Saturation is 95% on room air, adequate by my interpretation.    Cardiac: 65sr, normal   Date: 07/27/2011  Rate: 66  Rhythm: normal sinus rhythm  QRS Axis: normal  Intervals: normal  ST/T Wave abnormalities: normal  Conduction Disutrbances:none  Narrative Interpretation:   Old EKG Reviewed: none available NORMAL ECG   COORDINATION OF CARE: 3:14PM- Patient informed of current plan for treatment and evaluation and agrees with plan at this time. Will provide albuterol, order urinalysis and chest X-ray.  Results for orders placed during the hospital encounter of 07/27/11  URINALYSIS, ROUTINE W REFLEX MICROSCOPIC      Component Value Range   Color, Urine YELLOW  YELLOW   APPearance CLEAR  CLEAR   Specific Gravity, Urine 1.006  1.005 - 1.030   pH 5.5  5.0 - 8.0   Glucose, UA NEGATIVE  NEGATIVE mg/dL   Hgb urine dipstick NEGATIVE  NEGATIVE   Bilirubin Urine NEGATIVE  NEGATIVE   Ketones, ur NEGATIVE  NEGATIVE mg/dL   Protein, ur NEGATIVE  NEGATIVE mg/dL   Urobilinogen, UA 0.2  0.0 - 1.0 mg/dL   Nitrite  NEGATIVE  NEGATIVE   Leukocytes, UA NEGATIVE  NEGATIVE  CBC WITH DIFFERENTIAL      Component Value Range   WBC 6.4  4.0 - 10.5 K/uL   RBC 4.21  3.87 - 5.11 MIL/uL   Hemoglobin 12.3  12.0 - 15.0 g/dL   HCT 16.1  09.6 - 04.5 %   MCV 88.6  78.0 - 100.0 fL   MCH 29.2  26.0 - 34.0 pg   MCHC 33.0  30.0 - 36.0 g/dL   RDW 40.9  81.1 - 91.4 %   Platelets 246  150 - 400 K/uL   Neutrophils Relative 73  43 - 77 %   Neutro Abs 4.6  1.7 - 7.7 K/uL   Lymphocytes Relative 16  12 - 46 %   Lymphs Abs 1.0  0.7 - 4.0 K/uL   Monocytes Relative 11  3 - 12 %   Monocytes Absolute 0.7  0.1 - 1.0 K/uL   Eosinophils Relative 1  0 - 5 %   Eosinophils Absolute 0.0  0.0 - 0.7 K/uL   Basophils Relative 1  0 - 1 %   Basophils Absolute 0.0  0.0 - 0.1 K/uL  COMPREHENSIVE METABOLIC PANEL      Component Value Range   Sodium 139  135 - 145 mEq/L   Potassium 4.3  3.5 - 5.1 mEq/L   Chloride 101  96 - 112 mEq/L   CO2 25  19 - 32 mEq/L   Glucose, Bld 92  70 - 99 mg/dL   BUN 15  6 - 23 mg/dL   Creatinine, Ser 7.82  0.50 - 1.10 mg/dL   Calcium 9.4  8.4 - 95.6 mg/dL   Total Protein 8.0  6.0 - 8.3 g/dL   Albumin 4.2  3.5 - 5.2 g/dL   AST 23  0 - 37 U/L   ALT 8  0 - 35 U/L   Alkaline Phosphatase 96  39 - 117 U/L   Total Bilirubin 0.2 (*) 0.3 - 1.2 mg/dL   GFR calc non Af Amer 55 (*) >90 mL/min   GFR calc Af Amer 64 (*) >90 mL/min  LIPASE, BLOOD      Component Value Range   Lipase 29  11 - 59 U/L  TROPONIN I      Component Value Range   Troponin I <0.30  <0.30 ng/mL     Dg Chest 2 View  07/27/2011  *RADIOLOGY REPORT*  Clinical Data: Positive cough.  Asthma.  CHEST - 2 VIEW  Comparison: 07/08/2011  Findings: Changes of COPD are again demonstrated.  No evidence of pulmonary infiltrate or pleural effusion.  Heart size remains within normal limits.  A small hiatal hernia is demonstrated.  No other mass or lymphadenopathy identified.  IMPRESSION:  1.  Stable COPD and small hiatal hernia. 2.  No acute findings.   Original Report Authenticated By: Danae Orleans, M.D.     No diagnosis found.   On re-eval the patient notes improvement in her condition, and asks for d/c. MDM  I personally performed the services described in this documentation, which was scribed in my presence. The recorded information has been reviewed and considered.  This elderly female presents with concerns of cough and dyspnea.  On exam the patient is in no distress with clear lung sounds, unremarkable vital signs aside from mild hypertension.  Notably, the patient notes a history of bronchiectasis, the patient's x-ray is most consistent with COPD changes.  The patient's labs are unremarkable, with a nonischemic ECG and no evidence of ongoing cardiac ischemia.  Additionally, the patient is catheterization less than one year ago demonstrated particularly clean coronary arteries, which is further reassurance for the low suspicion of cardiac etiology.  Given the patient's x-ray, her description of dyspnea, her known pulmonary pathology, a COPD exacerbation seems most likely as an explanation for her symptoms.  The patient noted an improvement with continued oxygen, steroids in the emergency department.  The patient has followup in 2 days with her primary care physician.  Given the improvement, the largely reassuring evaluation, the patient was discharged in stable condition followup as scheduled  Gerhard Munch, MD 07/27/11 2257

## 2011-07-27 NOTE — ED Notes (Signed)
Pt has hx of pneumonia and has a cough and similar s/s for 24 hours. No distress.

## 2011-07-29 ENCOUNTER — Encounter: Payer: Self-pay | Admitting: Emergency Medicine

## 2011-07-29 ENCOUNTER — Ambulatory Visit (INDEPENDENT_AMBULATORY_CARE_PROVIDER_SITE_OTHER): Payer: Medicare Other | Admitting: Emergency Medicine

## 2011-07-29 VITALS — BP 120/80 | HR 88 | Temp 98.1°F | Ht 67.5 in | Wt 135.4 lb

## 2011-07-29 DIAGNOSIS — J479 Bronchiectasis, uncomplicated: Secondary | ICD-10-CM | POA: Diagnosis not present

## 2011-07-29 NOTE — Progress Notes (Signed)
78 yo never smoker, hx remote Legionella PNA, diagnosed with suspected asthma about 10 years ago. PFT's 7/10 with mixed disease, mild AFL. Formerly treated with Advair and albuterol, stopped when she moved to [place] in 2008. Tolerated the change well. Stayed on Singulair. At original consult we d/c'd ACE-I  ROV 08/24/10 --  bronchiectasis, mild AFL. Regular f/u. Coughing less, occasionally some dark sputum. CXR done July showed some scar and ? Nodularity. In general she is feeling well. No abx since last visit. Has not been needing flutter valve every day.   ROV 10/16/10 -- follow up for her bronchiectasis, mild AFL. Unfortunately since last visit she has an MI, managed by Dr Elease Hashimoto, hospitalized in 8/12, coronaries were clean. She was restarted on lisinopril (low dose), has restarted the dry cough. Also has the other stable cough that produces plugs, stable.   ROV 03/28/11 -- bronchiectasis, mild AFL, CAD. She had been doing well this winter until about 2 weeks ago when she developed fatigue, some low grade fever, dry paroxysms of cough, some clear sputum. Her plugs decreased for a while, now they are back. She had a single episode of wheeze that she treated with SABA. She believes that she is pretty much over this illness.   ROV/Hosp f/u 07/29/11 -- f/u visit for bronchiectasis, chronic cough, mild AFL. She also has CAD being medically managed by Dr Elease Hashimoto. She was admitted to [facility] with apparent NSTEMI 6/13. CT scan was done on that admission - shows bronchiectasis and tree-bud pattern as below, ? MAIC. Tells me she has been feeling bad x 2 weeks, went to ED over the weekend - chills, nausea, fatigue, dyspnea, dry cough. The SABA doesn't make her SOB much better. They rx her with prednisone + Bactrim. She does feel a bit better. She is coughing up green sputum.    Filed Vitals:   07/29/11 1334  BP: 120/80  Pulse: 88  Temp: 98.1 F (36.7 C)    Gen: Pleasant, well-nourished, in no distress,  normal  affect  ENT: No lesions,  mouth clear,  oropharynx clear, no postnasal drip  Neck: No JVD, no TMG, no carotid bruits  Lungs: No use of accessory muscles, no dullness to percussion, few scattered insp crackles L>>R  Cardiovascular: RRR, heart sounds normal, no murmur or gallops, no peripheral edema  Musculoskeletal: No deformities, no cyanosis or clubbing  Neuro: alert, non focal  Skin: Warm, no lesions or rashes   CT chest 06/19/11:  Comparison: 09/05/2010.  Findings: The chest wall is unremarkable. No breast masses,  supraclavicular or axillary lymphadenopathy. The bony thorax is  intact. No destructive bone lesions or spinal canal compromise.  The heart is normal in size. No pericardial effusion. No  mediastinal or hilar lymphadenopathy. Stable mediastinal lymph  nodes. The aorta is normal in caliber. No dissection. The  esophagus is grossly normal. Stable large hiatal hernia.  The pulmonary arterial tree is fairly well opacified. No filling  defects to suggest pulmonary emboli.  Examination of the lung parenchyma demonstrates patchy areas of  subpleural scarring changes and bronchiectasis. No worrisome  infiltrates or mass lesions. New patchy area of tree in bud  appearance in the right middle lobe typically seen with atypical  infections (MAC). No pleural effusion or pulmonary edema.  The upper abdomen is unremarkable.  IMPRESSION:  1. Stable areas of subsegmental atelectasis and scarring with  bronchiectasis.  2. New areas of tree in bud appearance most likely atypical  infection (MAC).  3. Stable  mediastinal and hilar lymph nodes.  4. No findings for pulmonary embolism.  5. Stable hiatal hernia.    BRONCHIECTASIS She has stable chronic bronchiectatic changes on CT scan 6/13, possible new tree-bud changes but very subtle. Hard for me to ascribe her sx to these changes, although MAIC could be culprit. I am more suspicious that sh eis poorly tolerating her BP/CAD  regimen, but difficult to know. I would like for her to finish the pred+ bactrim, then f/u in 1 month. At that time we may decide to start empiric [facility] therapy or repeat PFT. She has not wanted to do FOB or BAL in the past - would probably not tolerate well.

## 2011-07-29 NOTE — Assessment & Plan Note (Addendum)
She has stable chronic bronchiectatic changes on CT scan 6/13, possible new tree-bud changes but very subtle. Hard for me to ascribe her sx to these changes, although MAIC could be culprit. I am more suspicious that sh eis poorly tolerating her BP/CAD regimen, but difficult to know. I would like for her to finish the pred+ bactrim, then f/u in 1 month. At that time we may decide to start empiric [facility] therapy or repeat PFT. She has not wanted to do FOB or BAL in the past - would probably not tolerate well.

## 2011-07-29 NOTE — Patient Instructions (Addendum)
Please finish your prednisone and Bactrim as ordered Use your albuterol 2 puffs as needed Follow with Dr Delton Coombes in 1 month

## 2011-07-30 DIAGNOSIS — R079 Chest pain, unspecified: Secondary | ICD-10-CM | POA: Diagnosis not present

## 2011-07-30 DIAGNOSIS — I10 Essential (primary) hypertension: Secondary | ICD-10-CM | POA: Diagnosis not present

## 2011-07-30 DIAGNOSIS — G609 Hereditary and idiopathic neuropathy, unspecified: Secondary | ICD-10-CM | POA: Diagnosis not present

## 2011-07-30 DIAGNOSIS — J4489 Other specified chronic obstructive pulmonary disease: Secondary | ICD-10-CM | POA: Diagnosis not present

## 2011-07-30 DIAGNOSIS — J449 Chronic obstructive pulmonary disease, unspecified: Secondary | ICD-10-CM | POA: Diagnosis not present

## 2011-07-31 ENCOUNTER — Ambulatory Visit: Payer: Medicare Other | Admitting: Nurse Practitioner

## 2011-08-01 DIAGNOSIS — G609 Hereditary and idiopathic neuropathy, unspecified: Secondary | ICD-10-CM | POA: Diagnosis not present

## 2011-08-01 DIAGNOSIS — I10 Essential (primary) hypertension: Secondary | ICD-10-CM | POA: Diagnosis not present

## 2011-08-01 DIAGNOSIS — M6281 Muscle weakness (generalized): Secondary | ICD-10-CM | POA: Diagnosis not present

## 2011-08-01 DIAGNOSIS — J449 Chronic obstructive pulmonary disease, unspecified: Secondary | ICD-10-CM | POA: Diagnosis not present

## 2011-08-01 DIAGNOSIS — N393 Stress incontinence (female) (male): Secondary | ICD-10-CM | POA: Diagnosis not present

## 2011-08-01 DIAGNOSIS — R079 Chest pain, unspecified: Secondary | ICD-10-CM | POA: Diagnosis not present

## 2011-08-05 DIAGNOSIS — M899 Disorder of bone, unspecified: Secondary | ICD-10-CM | POA: Diagnosis not present

## 2011-08-05 DIAGNOSIS — E039 Hypothyroidism, unspecified: Secondary | ICD-10-CM | POA: Diagnosis not present

## 2011-08-05 DIAGNOSIS — IMO0002 Reserved for concepts with insufficient information to code with codable children: Secondary | ICD-10-CM | POA: Diagnosis not present

## 2011-08-05 DIAGNOSIS — A318 Other mycobacterial infections: Secondary | ICD-10-CM | POA: Diagnosis not present

## 2011-08-05 DIAGNOSIS — R55 Syncope and collapse: Secondary | ICD-10-CM | POA: Diagnosis not present

## 2011-08-05 DIAGNOSIS — J479 Bronchiectasis, uncomplicated: Secondary | ICD-10-CM | POA: Diagnosis not present

## 2011-08-05 DIAGNOSIS — I214 Non-ST elevation (NSTEMI) myocardial infarction: Secondary | ICD-10-CM | POA: Diagnosis not present

## 2011-08-05 DIAGNOSIS — M949 Disorder of cartilage, unspecified: Secondary | ICD-10-CM | POA: Diagnosis not present

## 2011-08-06 DIAGNOSIS — I10 Essential (primary) hypertension: Secondary | ICD-10-CM | POA: Diagnosis not present

## 2011-08-06 DIAGNOSIS — G609 Hereditary and idiopathic neuropathy, unspecified: Secondary | ICD-10-CM | POA: Diagnosis not present

## 2011-08-06 DIAGNOSIS — J449 Chronic obstructive pulmonary disease, unspecified: Secondary | ICD-10-CM | POA: Diagnosis not present

## 2011-08-06 DIAGNOSIS — M6281 Muscle weakness (generalized): Secondary | ICD-10-CM | POA: Diagnosis not present

## 2011-08-06 DIAGNOSIS — N393 Stress incontinence (female) (male): Secondary | ICD-10-CM | POA: Diagnosis not present

## 2011-08-06 DIAGNOSIS — R079 Chest pain, unspecified: Secondary | ICD-10-CM | POA: Diagnosis not present

## 2011-08-09 DIAGNOSIS — J479 Bronchiectasis, uncomplicated: Secondary | ICD-10-CM | POA: Diagnosis not present

## 2011-08-13 ENCOUNTER — Ambulatory Visit (INDEPENDENT_AMBULATORY_CARE_PROVIDER_SITE_OTHER): Payer: Medicare Other | Admitting: Nurse Practitioner

## 2011-08-13 ENCOUNTER — Encounter: Payer: Self-pay | Admitting: Nurse Practitioner

## 2011-08-13 VITALS — BP 110/62 | HR 82 | Ht 67.5 in | Wt 134.0 lb

## 2011-08-13 DIAGNOSIS — I214 Non-ST elevation (NSTEMI) myocardial infarction: Secondary | ICD-10-CM | POA: Diagnosis not present

## 2011-08-13 NOTE — Progress Notes (Signed)
[name] Date of Birth: 1921/03/14 Medical Record #[id number]  History of Present Illness: Sarah [name] is seen back today for an approximate 3 week check. She is seen for Dr. Elease Hashimoto. She has HTN, asthma, depression, OA and hypothyroidism. Had a NSTEMI back in August of last year with cath showing no obstructive disease and a negative CT for PE. She is managed medically. She has had recurrent symptoms and has been rehospitalized. CT again was negative for PE but showed MAC. There has been concern for chronically elevated troponins but her last one here was normal earlier this month. We started Imdur at her last visit.   She comes in today. She is here alone. Says she is doing from her heart standpoint. Sounds like she has had only one spell of chest pain since her last visit. She has follow up with Dr. Delton Coombes next month. She has switched PCP's to Dr. Paulino Rily and has been referred to neurology. She says she is happy with how she currently feels. Does have some headache with the Imdur but overall is tolerating.   Current Outpatient Prescriptions on File Prior to Visit  Medication Sig Dispense Refill  . acetaminophen-codeine (TYLENOL #3) 300-30 MG per tablet Take 1 tablet by mouth every 4 (four) hours as needed. For pain      . albuterol (PROVENTIL HFA;VENTOLIN HFA) 108 (90 BASE) MCG/ACT inhaler Inhale 2 puffs into the lungs every 6 (six) hours as needed. For shortness of breath      . amLODipine (NORVASC) 5 MG tablet Take 0.5 tablets (2.5 mg total) by mouth 2 (two) times daily.  30 tablet  11  . aspirin EC 81 MG tablet Take 81 mg by mouth daily.      . citalopram (CELEXA) 40 MG tablet Take 20 mg by mouth daily.       Marland Kitchen estradiol (VIVELLE-DOT) 0.05 MG/24HR Place 1 patch onto the skin once a week. Apply on Thursday      . fluticasone (FLONASE) 50 MCG/ACT nasal spray Place 2 sprays into the nose daily as needed. For congestion      . isosorbide mononitrate (IMDUR) 30 MG 24 hr tablet Take 1 tablet (30  mg total) by mouth daily.  30 tablet  11  . levothyroxine (SYNTHROID, LEVOTHROID) 50 MCG tablet Take 50 mcg by mouth daily.        Marland Kitchen lisinopril (PRINIVIL,ZESTRIL) 5 MG tablet Take 0.5 tablets (2.5 mg total) by mouth 2 (two) times daily.  60 tablet  5  . metoprolol tartrate (LOPRESSOR) 25 MG tablet Take 0.5 tablets (12.5 mg total) by mouth 2 (two) times daily.  60 tablet  11  . montelukast (SINGULAIR) 10 MG tablet Take 10 mg by mouth at bedtime.        . nitroGLYCERIN (NITROSTAT) 0.4 MG SL tablet Place 0.4 mg under the tongue every 5 (five) minutes as needed. For chest pain      . pregabalin (LYRICA) 50 MG capsule Take 50 mg by mouth 2 (two) times daily.       . simvastatin (ZOCOR) 20 MG tablet Take 1 tablet (20 mg total) by mouth every evening.  30 tablet  5    Allergies  Allergen Reactions  . Hydrocodone Nausea And Vomiting and Other (See Comments)    Passed out, threw up, blood pressure dropped  . Levofloxacin Other (See Comments)    Pass out  . Oxycodone Other (See Comments)    Rapid heart beat  . Clarithromycin Other (See Comments)  unknown    Past Medical History  Diagnosis Date  . Bronchiectasis     with history of Legionaires with chronic rales/rhonchi  . Pollen allergies   . Asthma   . Hypertension     Negative renal duplex in December of 2012  . Allergic rhinitis   . NSTEMI (non-ST elevated myocardial infarction)     a. Normal coronaries per cath August 2012 and negative CT angio for PE;  b. 06/2011 Repeat admission w/ chest pain and elevated troponin's, CTA Chest w/o PE  . Diastolic dysfunction     a. per echo 08/2010 with normal LV function;  b. 06/2011 Echo: EF 65-70%  . Idiopathic peripheral neuropathy   . Hypothyroidism   . Depression   . Shortness of breath   . Pneumonia     hx of pna  . H/O hiatal hernia   . Arthritis     Past Surgical History  Procedure Date  . Dilation and curettage of uterus   . Total abdominal hysterectomy w/ bilateral  salpingoophorectomy 1972    leiomyomata, menorrhagia  . Appendectomy   . Cardiac catheterization August 2012    Normal coronaries.  . Tonsillectomy     History  Smoking status  . Never Smoker   Smokeless tobacco  . Never Used    History  Alcohol Use  . Yes    wine 1-2 week    Family History  Problem Relation Age of Onset  . Hypertension Mother     stroke  . Cancer Brother     prostrate    Review of Systems: The review of systems is per the HPI.  All other systems were reviewed and are negative.  Physical Exam: BP 110/62  Pulse 82  Ht 5' 7.5" (1.715 m)  Wt 134 lb (60.782 kg)  BMI 20.68 kg/m2  LMP 01/14/1970 Patient is very pleasant and in no acute distress. Skin is warm and dry. Color is normal.  HEENT is unremarkable. Normocephalic/atraumatic. PERRL. Sclera are nonicteric. Neck is supple. No masses. No JVD. Lungs are clear. Cardiac exam shows a regular rate and rhythm. Abdomen is soft. Extremities are without edema. Gait and ROM are intact. No gross neurologic deficits noted.   LABORATORY DATA:  Lab Results  Component Value Date   CKTOTAL 89 06/19/2011   CKMB 4.4* 06/19/2011   TROPONINI <0.30 07/27/2011   .Assessment / Plan:

## 2011-08-13 NOTE — Patient Instructions (Addendum)
You can take the Imdur at night  Stay on your current medicines.  Dr. Elease Hashimoto will see you in 4 months  Call the [facility] office at [phone] if you have any questions, problems or concerns.

## 2011-08-13 NOTE — Assessment & Plan Note (Signed)
She looks to be doing better from our standpoint. She is happy with how she is doing at this time. No change in her medicines. Will see her back in about 4 months. Patient is agreeable to this plan and will call if any problems develop in the interim.

## 2011-08-20 ENCOUNTER — Ambulatory Visit
Admission: RE | Admit: 2011-08-20 | Discharge: 2011-08-20 | Disposition: A | Discharge status: Home / Self Care | Payer: Medicare Other | Source: Ambulatory Visit | Attending: Internal Medicine | Admitting: Internal Medicine

## 2011-08-20 DIAGNOSIS — Z1231 Encounter for screening mammogram for malignant neoplasm of breast: Secondary | ICD-10-CM

## 2011-08-22 DIAGNOSIS — R0602 Shortness of breath: Secondary | ICD-10-CM | POA: Diagnosis not present

## 2011-08-27 ENCOUNTER — Encounter: Payer: Self-pay | Admitting: Gynecology

## 2011-08-27 ENCOUNTER — Ambulatory Visit (INDEPENDENT_AMBULATORY_CARE_PROVIDER_SITE_OTHER): Payer: Medicare Other | Admitting: Gynecology

## 2011-08-27 VITALS — BP 108/70 | Ht 65.5 in | Wt 136.5 lb

## 2011-08-27 DIAGNOSIS — M899 Disorder of bone, unspecified: Secondary | ICD-10-CM | POA: Diagnosis not present

## 2011-08-27 DIAGNOSIS — Z79890 Hormone replacement therapy (postmenopausal): Secondary | ICD-10-CM

## 2011-08-27 DIAGNOSIS — N952 Postmenopausal atrophic vaginitis: Secondary | ICD-10-CM | POA: Diagnosis not present

## 2011-08-27 DIAGNOSIS — M8580 Other specified disorders of bone density and structure, unspecified site: Secondary | ICD-10-CM

## 2011-08-27 DIAGNOSIS — M949 Disorder of cartilage, unspecified: Secondary | ICD-10-CM | POA: Diagnosis not present

## 2011-08-27 MED ORDER — ESTRADIOL 0.05 MG/24HR TD PTTW: 1.0000 | MEDICATED_PATCH | TRANSDERMAL | Status: DC

## 2011-08-27 NOTE — Patient Instructions (Signed)
Continue on estrogen patches as we discussed. Follow up in one year for reexamination.

## 2011-08-27 NOTE — Progress Notes (Signed)
Caitlin Ainley [birth year] [id number]        78 y.o.  G2P2 for follow up exam.  Several issues noted below.  Past medical history,surgical history, medications, allergies, family history and social history were all reviewed and documented in the EPIC chart. ROS:  Was performed and pertinent positives and negatives are included in the history.  Exam: Sherrilyn Rist assistant Filed Vitals:   08/27/11 1050  BP: 108/70  Height: 5' 5.5" (1.664 m)  Weight: 136 lb 8 oz (61.916 kg)   General appearance  Normal Skin grossly normal Head/Neck normal with no cervical or supraclavicular adenopathy thyroid normal Lungs  clear Cardiac RR, without RMG Abdominal  soft, nontender, without masses, organomegaly or hernia Breasts  examined lying and sitting without masses, retractions, discharge or axillary adenopathy. Pelvic  Ext/BUS/vagina  normal with atrophic changes  Adnexa  Without masses or tenderness    Anus and perineum  normal   Rectovaginal  normal sphincter tone without palpated masses or tenderness.    Assessment/Plan:  78 y.o. G2P2 female for follow up exam.   1. ERT. Patient continues on Vivelle dot 0.05. She uses one patch weekly. As per my note last year she was taken off by her primary physician had worsening hot flashes, fatigue, sleep disturbances and depression. She's feeling great on the patch and wants to continue. I again reviewed the risks given her age and comorbidities to include stroke heart attack DVT possible breast cancer risk. Patient understands accepts these risks and wants to continue it I refilled her times a year. 2. Osteopenia. Patient had DEXA earlier this year through her primary physician's office which showed T score -2.1. No fracture was done because she is on estrogen. I reviewed the increased fracture risk in options for management to include additional bisphosphate. The issues of fracture and devastating sequelae reviewed. The patient declined treatment stated that she  already taking too many pills and does not want to take anything more than her estrogen and accepts the risks. She'll continue to follow up with her primary also a reference to this as they originally ordered the test and follows her for this. 3. Mammography. Patient had her mammogram this year. She'll continue with annual mammography. SBE monthly reviewed. She was having some breast tenderness last year when I saw her and this has resolved. 4. Pap smear. No Pap smear done as we have stopped routine screening. 5. Colonoscopy. Her her primary's recommendation. 6. Health maintenance. No blood work or other studies done as it's all done through her primary physician's office. Follow up in one year, sooner as needed.   Dara Lords MD, 11:27 AM 08/27/2011

## 2011-08-30 ENCOUNTER — Encounter: Payer: Self-pay | Admitting: Emergency Medicine

## 2011-08-30 ENCOUNTER — Ambulatory Visit (INDEPENDENT_AMBULATORY_CARE_PROVIDER_SITE_OTHER): Payer: Medicare Other | Admitting: Emergency Medicine

## 2011-08-30 VITALS — BP 98/62 | HR 59 | Temp 97.0°F | Ht 65.5 in | Wt 137.2 lb

## 2011-08-30 DIAGNOSIS — J479 Bronchiectasis, uncomplicated: Secondary | ICD-10-CM | POA: Diagnosis not present

## 2011-08-30 MED ORDER — BUDESONIDE-FORMOTEROL FUMARATE 160-4.5 MCG/ACT IN AERO: 2.0000 | INHALATION_SPRAY | Freq: Two times a day (BID) | RESPIRATORY_TRACT | Status: DC

## 2011-08-30 NOTE — Assessment & Plan Note (Signed)
She does not want empiric abx for [facility] at this time. She is willing to add Symbicort to see if this helps her fatigue. Will likely need repeat CT scan and PFT's in 6 months.

## 2011-08-30 NOTE — Patient Instructions (Addendum)
Start Symbicort 260/4.15mcg, 2 puffs twice a day until our next visit Continue to use albuterol 2 puffs as needed We will not start any antibiotics at this time Follow with Dr Delton Coombes in 6 weeks We will probably repeat your CT scan of the chest and your breathing tests in 6 months

## 2011-08-30 NOTE — Progress Notes (Signed)
78 yo never smoker, hx remote Legionella PNA, diagnosed with suspected asthma about 10 years ago. PFT's 7/10 with mixed disease, mild AFL. Formerly treated with Advair and albuterol, stopped when she moved to [place] in 2008. Tolerated the change well. Stayed on Singulair. At original consult we d/c'd ACE-I  ROV 08/24/10 --  bronchiectasis, mild AFL. Regular f/u. Coughing less, occasionally some dark sputum. CXR done July showed some scar and ? Nodularity. In general she is feeling well. No abx since last visit. Has not been needing flutter valve every day.   ROV 10/16/10 -- follow up for her bronchiectasis, mild AFL. Unfortunately since last visit she has an MI, managed by Dr Elease Hashimoto, hospitalized in 8/12, coronaries were clean. She was restarted on lisinopril (low dose), has restarted the dry cough. Also has the other stable cough that produces plugs, stable.   ROV 03/28/11 -- bronchiectasis, mild AFL, CAD. She had been doing well this winter until about 2 weeks ago when she developed fatigue, some low grade fever, dry paroxysms of cough, some clear sputum. Her plugs decreased for a while, now they are back. She had a single episode of wheeze that she treated with SABA. She believes that she is pretty much over this illness.   ROV/Hosp f/u 07/29/11 -- f/u visit for bronchiectasis, chronic cough, mild AFL. She also has CAD being medically managed by Dr Elease Hashimoto. She was admitted to [facility] with apparent NSTEMI 6/13. CT scan was done on that admission - shows bronchiectasis and tree-bud pattern as below, ? MAIC. Tells me she has been feeling bad x 2 weeks, went to ED over the weekend - chills, nausea, fatigue, dyspnea, dry cough. The SABA doesn't make her SOB much better. They rx her with prednisone + Bactrim. She does feel a bit better. She is coughing up green sputum.   ROV 08/30/11 -- hx bronchiectasis, chronic coughing, CAD. CT scan consistent w MAIC. Returns today after finishing her Bactrim and prednisone. She felt  better after these meds, but now still has episodes of weakness, fatigue, ? Palpitations. She is concerned that she is having difficulty with her BP regimen. She doesn't want to start empiric abx right now.    Filed Vitals:   08/30/11 1431  BP: 98/62  Pulse: 59  Temp: 97 F (36.1 C)    Gen: Pleasant, well-nourished, in no distress,  normal affect  ENT: No lesions,  mouth clear,  oropharynx clear, no postnasal drip  Neck: No JVD, no TMG, no carotid bruits  Lungs: No use of accessory muscles, no dullness to percussion, few scattered insp crackles L>>R  Cardiovascular: RRR, heart sounds normal, no murmur or gallops, no peripheral edema  Musculoskeletal: No deformities, no cyanosis or clubbing  Neuro: alert, non focal  Skin: Warm, no lesions or rashes   CT chest 06/19/11:  Comparison: 09/05/2010.  Findings: The chest wall is unremarkable. No breast masses,  supraclavicular or axillary lymphadenopathy. The bony thorax is  intact. No destructive bone lesions or spinal canal compromise.  The heart is normal in size. No pericardial effusion. No  mediastinal or hilar lymphadenopathy. Stable mediastinal lymph  nodes. The aorta is normal in caliber. No dissection. The  esophagus is grossly normal. Stable large hiatal hernia.  The pulmonary arterial tree is fairly well opacified. No filling  defects to suggest pulmonary emboli.  Examination of the lung parenchyma demonstrates patchy areas of  subpleural scarring changes and bronchiectasis. No worrisome  infiltrates or mass lesions. New patchy area of tree  in bud  appearance in the right middle lobe typically seen with atypical  infections (MAC). No pleural effusion or pulmonary edema.  The upper abdomen is unremarkable.  IMPRESSION:  1. Stable areas of subsegmental atelectasis and scarring with  bronchiectasis.  2. New areas of tree in bud appearance most likely atypical  infection (MAC).  3. Stable mediastinal and hilar lymph  nodes.  4. No findings for pulmonary embolism.  5. Stable hiatal hernia.    BRONCHIECTASIS She does not want empiric abx for [facility] at this time. She is willing to add Symbicort to see if this helps her fatigue. Will likely need repeat CT scan and PFT's in 6 months.

## 2011-09-09 DIAGNOSIS — R404 Transient alteration of awareness: Secondary | ICD-10-CM | POA: Diagnosis not present

## 2011-09-12 ENCOUNTER — Other Ambulatory Visit: Payer: Self-pay | Admitting: Diagnostic Neuroimaging

## 2011-09-12 DIAGNOSIS — R404 Transient alteration of awareness: Secondary | ICD-10-CM

## 2011-09-20 ENCOUNTER — Ambulatory Visit
Admission: RE | Admit: 2011-09-20 | Discharge: 2011-09-20 | Disposition: A | Discharge status: Home / Self Care | Payer: Medicare Other | Source: Ambulatory Visit | Attending: Diagnostic Neuroimaging | Admitting: Diagnostic Neuroimaging

## 2011-09-20 DIAGNOSIS — R404 Transient alteration of awareness: Secondary | ICD-10-CM

## 2011-09-20 DIAGNOSIS — F29 Unspecified psychosis not due to a substance or known physiological condition: Secondary | ICD-10-CM | POA: Diagnosis not present

## 2011-09-26 ENCOUNTER — Other Ambulatory Visit: Payer: Self-pay | Admitting: Diagnostic Neuroimaging

## 2011-09-26 DIAGNOSIS — R404 Transient alteration of awareness: Secondary | ICD-10-CM

## 2011-10-01 DIAGNOSIS — J209 Acute bronchitis, unspecified: Secondary | ICD-10-CM | POA: Diagnosis not present

## 2011-10-03 ENCOUNTER — Ambulatory Visit
Admission: RE | Admit: 2011-10-03 | Discharge: 2011-10-03 | Disposition: A | Discharge status: Home / Self Care | Payer: Medicare Other | Source: Ambulatory Visit | Attending: Diagnostic Neuroimaging | Admitting: Diagnostic Neuroimaging

## 2011-10-03 DIAGNOSIS — R404 Transient alteration of awareness: Secondary | ICD-10-CM | POA: Diagnosis not present

## 2011-10-03 DIAGNOSIS — R9409 Abnormal results of other function studies of central nervous system: Secondary | ICD-10-CM | POA: Diagnosis not present

## 2011-10-10 ENCOUNTER — Ambulatory Visit
Admission: RE | Admit: 2011-10-10 | Discharge: 2011-10-10 | Disposition: A | Discharge status: Home / Self Care | Payer: Medicare Other | Source: Ambulatory Visit | Attending: Family Medicine | Admitting: Family Medicine

## 2011-10-10 ENCOUNTER — Other Ambulatory Visit: Payer: Self-pay | Admitting: Family Medicine

## 2011-10-10 DIAGNOSIS — J209 Acute bronchitis, unspecified: Secondary | ICD-10-CM

## 2011-10-11 ENCOUNTER — Ambulatory Visit: Payer: Medicare Other | Admitting: Nurse Practitioner

## 2011-10-15 ENCOUNTER — Ambulatory Visit: Payer: Medicare Other | Admitting: Emergency Medicine

## 2011-10-15 DIAGNOSIS — J209 Acute bronchitis, unspecified: Secondary | ICD-10-CM | POA: Diagnosis not present

## 2011-10-15 DIAGNOSIS — K449 Diaphragmatic hernia without obstruction or gangrene: Secondary | ICD-10-CM | POA: Diagnosis not present

## 2011-10-18 ENCOUNTER — Ambulatory Visit (INDEPENDENT_AMBULATORY_CARE_PROVIDER_SITE_OTHER): Payer: Medicare Other | Admitting: Nurse Practitioner

## 2011-10-18 ENCOUNTER — Encounter: Payer: Self-pay | Admitting: Nurse Practitioner

## 2011-10-18 VITALS — BP 85/50 | HR 92 | Ht 67.5 in | Wt 135.0 lb

## 2011-10-18 DIAGNOSIS — I951 Orthostatic hypotension: Secondary | ICD-10-CM | POA: Diagnosis not present

## 2011-10-18 LAB — CBC WITH DIFFERENTIAL/PLATELET
Basophils Absolute: 0 10*3/uL (ref 0.0–0.1)
Basophils Relative: 0.3 % (ref 0.0–3.0)
Eosinophils Absolute: 0.2 10*3/uL (ref 0.0–0.7)
Eosinophils Relative: 1.8 % (ref 0.0–5.0)
HCT: 39.1 % (ref 36.0–46.0)
Hemoglobin: 12.5 g/dL (ref 12.0–15.0)
Lymphocytes Relative: 19.4 % (ref 12.0–46.0)
Lymphs Abs: 1.8 10*3/uL (ref 0.7–4.0)
MCHC: 31.9 g/dL (ref 30.0–36.0)
MCV: 89.7 fl (ref 78.0–100.0)
Monocytes Absolute: 0.8 10*3/uL (ref 0.1–1.0)
Monocytes Relative: 8.2 % (ref 3.0–12.0)
Neutro Abs: 6.5 10*3/uL (ref 1.4–7.7)
Neutrophils Relative %: 70.3 % (ref 43.0–77.0)
Platelets: 298 10*3/uL (ref 150.0–400.0)
RBC: 4.36 Mil/uL (ref 3.87–5.11)
RDW: 14.4 % (ref 11.5–14.6)
WBC: 9.2 10*3/uL (ref 4.5–10.5)

## 2011-10-18 LAB — BASIC METABOLIC PANEL
BUN: 24 mg/dL — ABNORMAL HIGH (ref 6–23)
CO2: 25 mEq/L (ref 19–32)
Calcium: 8.3 mg/dL — ABNORMAL LOW (ref 8.4–10.5)
Chloride: 101 mEq/L (ref 96–112)
Creatinine, Ser: 1.1 mg/dL (ref 0.4–1.2)
GFR: 50.27 mL/min — ABNORMAL LOW (ref >60.00)
Glucose, Bld: 110 mg/dL — ABNORMAL HIGH (ref 70–99)
Potassium: 4 mEq/L (ref 3.5–5.1)
Sodium: 136 mEq/L (ref 135–145)

## 2011-10-18 NOTE — Patient Instructions (Signed)
Your blood pressure is too low  Stop the Norvasc and the Lisinopril  Only take the Metoprolol if your BP is above 120 on the top number  You can a little more salt  Drink lots of fluids  I will see you on Wednesday  Check your blood pressure at home for me and write some readings down for me to look at.   Call the [facility] office at [phone] if you have any questions, problems or concerns.

## 2011-10-18 NOTE — Progress Notes (Signed)
Sarah [name] Date of Birth: 01-14-1922 Medical Record #[id number]  History of Present Illness: Sarah [name] is seen back today for a work in visit. She is seen for Dr. Elease Hashimoto. She is 89. She has HTN, asthma, depression, OA and hypothyroidism. Had a NSTEMI in August of last year with cath showing no obstructive disease. Negative CT for PE. Has been managed medically. She also has bronchiectasis and MAC and is followed by Dr. Delton Coombes. She has had recurrent bronchitis. She has had spells of ? "distress". May be anxiety driven. Has seen neurology and sounds like she had a negative evaluation.   She comes in today. Her husband drove. She is in the room alone. She has here at the end of July. Since then she has had recurrent bronchitis. She has been on Prednisone recently. About 2 to 3 weeks ago, she started feeling pretty weak. BP was quite low at home. She has not passed out. She did not stop any of her medicines. She tries to watch her salt. She says she has not had any more spells of "distress". She is always short of breath. Has been slow to recover from this last bout of bronchitis.   Current Outpatient Prescriptions on File Prior to Visit  Medication Sig Dispense Refill  . acetaminophen-codeine (TYLENOL #3) 300-30 MG per tablet Take 1 tablet by mouth every 4 (four) hours as needed. For pain      . albuterol (PROVENTIL HFA;VENTOLIN HFA) 108 (90 BASE) MCG/ACT inhaler Inhale 2 puffs into the lungs every 6 (six) hours as needed. For shortness of breath      . aspirin EC 81 MG tablet Take 81 mg by mouth daily.      . budesonide-formoterol (SYMBICORT) 160-4.5 MCG/ACT inhaler Inhale 2 puffs into the lungs 2 (two) times daily.  1 Inhaler  12  . citalopram (CELEXA) 40 MG tablet Take 20 mg by mouth daily.       Marland Kitchen estradiol (VIVELLE-DOT) 0.05 MG/24HR Place 1 patch (0.05 mg total) onto the skin once a week. Apply on Thursday  8 patch  11  . fluticasone (FLONASE) 50 MCG/ACT nasal spray Place 2 sprays into the  nose daily as needed. For congestion      . isosorbide mononitrate (IMDUR) 30 MG 24 hr tablet Take 1 tablet (30 mg total) by mouth daily.  30 tablet  11  . levothyroxine (SYNTHROID, LEVOTHROID) 50 MCG tablet Take 50 mcg by mouth daily.        . metoprolol tartrate (LOPRESSOR) 25 MG tablet Take 0.5 tablets (12.5 mg total) by mouth 2 (two) times daily.  60 tablet  11  . montelukast (SINGULAIR) 10 MG tablet Take 10 mg by mouth at bedtime.        . nitroGLYCERIN (NITROSTAT) 0.4 MG SL tablet Place 0.4 mg under the tongue every 5 (five) minutes as needed. For chest pain      . pregabalin (LYRICA) 50 MG capsule Take 50 mg by mouth 2 (two) times daily.       . simvastatin (ZOCOR) 20 MG tablet Take 1 tablet (20 mg total) by mouth every evening.  30 tablet  5    Allergies  Allergen Reactions  . Hydrocodone Nausea And Vomiting and Other (See Comments)    Passed out, threw up, blood pressure dropped  . Levofloxacin Other (See Comments)    Pass out  . Oxycodone Other (See Comments)    Rapid heart beat  . Clarithromycin Other (See Comments)  unknown    Past Medical History  Diagnosis Date  . Bronchiectasis     with history of Legionaires with chronic rales/rhonchi  . Pollen allergies   . Asthma   . Hypertension     Negative renal duplex in December of 2012  . Allergic rhinitis   . NSTEMI (non-ST elevated myocardial infarction)     a. Normal coronaries per cath August 2012 and negative CT angio for PE;  b. 06/2011 Repeat admission w/ chest pain and elevated troponin's, CTA Chest w/o PE  . Diastolic dysfunction     a. per echo 08/2010 with normal LV function;  b. 06/2011 Echo: EF 65-70%  . Idiopathic peripheral neuropathy   . Hypothyroidism   . Depression   . Shortness of breath   . Pneumonia     hx of pna  . H/O hiatal hernia   . Arthritis   . Osteopenia 02/2011    t score - 2.1    Past Surgical History  Procedure Date  . Dilation and curettage of uterus   . Total abdominal  hysterectomy w/ bilateral salpingoophorectomy 1972    leiomyomata, menorrhagia  . Appendectomy   . Cardiac catheterization August 2012    Normal coronaries.  . Tonsillectomy     History  Smoking status  . Never Smoker   Smokeless tobacco  . Never Used    History  Alcohol Use  . Yes    wine 1-2 week    Family History  Problem Relation Age of Onset  . Hypertension Mother     stroke  . Cancer Brother     prostrate    Review of Systems: The review of systems is per the HPI.  All other systems were reviewed and are negative.  Physical Exam: BP 85/50  Pulse 92  Ht 5' 7.5" (1.715 m)  Wt 135 lb (61.236 kg)  BMI 20.83 kg/m2  LMP 01/14/1970 Patient is very pleasant and looks fatigued.  Skin is warm and dry. Color is a little pale.   HEENT is unremarkable. Normocephalic/atraumatic. PERRL. Sclera are nonicteric. Neck is supple. No masses. No JVD. Lungs are clear. Cardiac exam shows a regular rate and rhythm. Abdomen is soft. Extremities are without edema. Gait and ROM are intact. No gross neurologic deficits noted.  LABORATORY DATA: PENDING FOR TODAY.  Lab Results  Component Value Date   WBC 6.4 07/27/2011   HGB 12.3 07/27/2011   HCT 37.3 07/27/2011   PLT 246 07/27/2011   GLUCOSE 92 07/27/2011   CHOL 189 07/22/2011   TRIG 54.0 07/22/2011   HDL 77.40 07/22/2011   LDLCALC 101* 07/22/2011   ALT 8 07/27/2011   AST 23 07/27/2011   NA 139 07/27/2011   K 4.3 07/27/2011   CL 101 07/27/2011   CREATININE 0.90 07/27/2011   BUN 15 07/27/2011   CO2 25 07/27/2011   TSH 2.940 06/19/2011   INR 1.00 06/19/2011   HGBA1C 6.1* 09/06/2010    Assessment / Plan:  1. Hypotension - I have stopped her ACE and CCB. I have left her on her very low dose metoprolol but she is to take only if her systolic is above 120. I advised her to use a little more salt and stay hydrated. She is not to drive. She is to monitor her blood pressure at home. Her husband is updated as well. I will see her back next week to recheck  her. Patient is agreeable to this plan and will call if any problems develop  in the interim.   2. Bronchitis - I suspect this will be chronic. She has not been on chronic steroid use. I don't think this is an adrenal issue. Her blood pressure was low even before she took the steroids.

## 2011-10-22 ENCOUNTER — Telehealth: Payer: Self-pay | Admitting: *Deleted

## 2011-10-22 NOTE — Telephone Encounter (Signed)
Message copied by Awilda Bill on Tue Oct 22, 2011  9:03 AM ------      Message from: Rosalio Macadamia      Created: Sat Oct 19, 2011 11:43 AM       Ok to report. Would like to repeat a BMET when I see her back.

## 2011-10-22 NOTE — Telephone Encounter (Signed)
Pt notified of lab results.  Pt will follow up with Lawson Fiscal 10/23/2011.  Vista Mink, CMA

## 2011-10-23 ENCOUNTER — Ambulatory Visit (INDEPENDENT_AMBULATORY_CARE_PROVIDER_SITE_OTHER): Payer: Medicare Other | Admitting: Nurse Practitioner

## 2011-10-23 ENCOUNTER — Encounter: Payer: Self-pay | Admitting: Nurse Practitioner

## 2011-10-23 VITALS — BP 98/66 | HR 65 | Ht 67.0 in | Wt 134.0 lb

## 2011-10-23 DIAGNOSIS — I951 Orthostatic hypotension: Secondary | ICD-10-CM

## 2011-10-23 NOTE — Progress Notes (Signed)
[name] Date of Birth: 1922-01-09 Medical Record #[id number]  History of Present Illness: Sarah [name] is seen back today for a 5 day check. She is seen for Dr. Elease Hashimoto. She is 89. She has HTN, asthma, recurrent bronchitis, depression, OA, and hypothyroidism. She had a NSTEMI in August of last year with cath showing no obstructive CAD. Negative for PE as well. Has been managed medically. Also has bronchiectasis and MAC is followed by Dr. Solon Augusta. She has had recurrent bronchitis here recently. Had been treated with 2 rounds of prednisone and antibiotics.   I saw her on Friday. She was quite hypotensive and had been for several weeks. I stopped her Norvasc and her Lisinopril and changed her Metoprolol to only take if her readings were above 120 systolic. Salt use was encouraged. I limited her driving.   She comes back today. She is in the room by herself. She is feeling better. Blood pressure is primarily in the 90 to 110 range. She has still had a few readings in the 70 to 80's. She is not having chest pain. She feels less "woozy". She has only taken her Metoprolol two or three times since she was here on Friday. She feels like she is pretty much over the bronchitis. She is getting more sodium. She has been trying to walk some.   Current Outpatient Prescriptions on File Prior to Visit  Medication Sig Dispense Refill  . acetaminophen-codeine (TYLENOL #3) 300-30 MG per tablet Take 1 tablet by mouth every 4 (four) hours as needed. For pain      . albuterol (PROVENTIL HFA;VENTOLIN HFA) 108 (90 BASE) MCG/ACT inhaler Inhale 2 puffs into the lungs every 6 (six) hours as needed. For shortness of breath      . aspirin EC 81 MG tablet Take 81 mg by mouth daily.      . budesonide-formoterol (SYMBICORT) 160-4.5 MCG/ACT inhaler Inhale 2 puffs into the lungs 2 (two) times daily.  1 Inhaler  12  . citalopram (CELEXA) 40 MG tablet Take 20 mg by mouth daily.       Marland Kitchen estradiol (VIVELLE-DOT) 0.05 MG/24HR Place 1  patch (0.05 mg total) onto the skin once a week. Apply on Thursday  8 patch  11  . fluticasone (FLONASE) 50 MCG/ACT nasal spray Place 2 sprays into the nose daily as needed. For congestion      . isosorbide mononitrate (IMDUR) 30 MG 24 hr tablet Take 1 tablet (30 mg total) by mouth daily.  30 tablet  11  . levothyroxine (SYNTHROID, LEVOTHROID) 50 MCG tablet Take 50 mcg by mouth daily.        . montelukast (SINGULAIR) 10 MG tablet Take 10 mg by mouth at bedtime.        . nitroGLYCERIN (NITROSTAT) 0.4 MG SL tablet Place 0.4 mg under the tongue every 5 (five) minutes as needed. For chest pain      . pregabalin (LYRICA) 50 MG capsule Take 50 mg by mouth 2 (two) times daily.       . simvastatin (ZOCOR) 20 MG tablet Take 1 tablet (20 mg total) by mouth every evening.  30 tablet  5    Allergies  Allergen Reactions  . Hydrocodone Nausea And Vomiting and Other (See Comments)    Passed out, threw up, blood pressure dropped  . Levofloxacin Other (See Comments)    Pass out  . Oxycodone Other (See Comments)    Rapid heart beat  . Clarithromycin Other (See Comments)  unknown    Past Medical History  Diagnosis Date  . Bronchiectasis     with history of Legionaires with chronic rales/rhonchi  . Pollen allergies   . Asthma   . Hypertension     Negative renal duplex in December of 2012  . Allergic rhinitis   . NSTEMI (non-ST elevated myocardial infarction)     a. Normal coronaries per cath August 2012 and negative CT angio for PE;  b. 06/2011 Repeat admission w/ chest pain and elevated troponin's, CTA Chest w/o PE  . Diastolic dysfunction     a. per echo 08/2010 with normal LV function;  b. 06/2011 Echo: EF 65-70%  . Idiopathic peripheral neuropathy   . Hypothyroidism   . Depression   . Shortness of breath   . Pneumonia     hx of pna  . H/O hiatal hernia   . Arthritis   . Osteopenia 02/2011    t score - 2.1    Past Surgical History  Procedure Date  . Dilation and curettage of uterus     . Total abdominal hysterectomy w/ bilateral salpingoophorectomy 1972    leiomyomata, menorrhagia  . Appendectomy   . Cardiac catheterization August 2012    Normal coronaries.  . Tonsillectomy     History  Smoking status  . Never Smoker   Smokeless tobacco  . Never Used    History  Alcohol Use  . Yes    wine 1-2 week    Family History  Problem Relation Age of Onset  . Hypertension Mother     stroke  . Cancer Brother     prostrate    Review of Systems: The review of systems is per the HPI.  All other systems were reviewed and are negative.  Physical Exam: BP 98/66  Pulse 65  Ht 5\' 7"  (1.702 m)  Wt 134 lb (60.782 kg)  BMI 20.99 kg/m2  SpO2 95%  LMP 01/14/1970 Her weight is down one pound Patient is very pleasant and in no acute distress. She looks better today. Skin is warm and dry. Color is normal.  HEENT is unremarkable. Normocephalic/atraumatic. PERRL. Sclera are nonicteric. Neck is supple. No masses. No JVD. Lungs are clear. Cardiac exam shows a regular rate and rhythm. Abdomen is soft. Extremities are without edema. Gait and ROM are intact. No gross neurologic deficits noted.  LABORATORY DATA:  Lab Results  Component Value Date   WBC 9.2 10/18/2011   HGB 12.5 10/18/2011   HCT 39.1 10/18/2011   PLT 298.0 10/18/2011   GLUCOSE 110* 10/18/2011   CHOL 189 07/22/2011   TRIG 54.0 07/22/2011   HDL 77.40 07/22/2011   LDLCALC 101* 07/22/2011   ALT 8 07/27/2011   AST 23 07/27/2011   NA 136 10/18/2011   K 4.0 10/18/2011   CL 101 10/18/2011   CREATININE 1.1 10/18/2011   BUN 24* 10/18/2011   CO2 25 10/18/2011   TSH 2.940 06/19/2011   INR 1.00 06/19/2011   HGBA1C 6.1* 09/06/2010     Assessment / Plan: 1. Hypotension - I am not sure as to what the etiology is for this. She was not a chronic steroid user. I have stopped her Metoprolol altogether. She may need an adrenal work up. Will ask Dr. Elease Hashimoto to review as well. I would like her to see her PCP next week and I will see her back in  about 2 weeks.  2. Bronchitis - has known MAC - seems improved. Has follow up with Dr.  Byrum.   Call the [facility] office at [phone] if you have any questions, problems or concerns.

## 2011-10-23 NOTE — Patient Instructions (Signed)
Stop the metoprolol altogether  Continue to increase your fluids and salt use  See Dr. Paulino Rily next week and see if she needs any more testing.   Continue to monitor your blood pressure at home.   I will see you in 2 weeks  Call the [facility] office at [phone] if you have any questions, problems or concerns.

## 2011-10-28 DIAGNOSIS — J209 Acute bronchitis, unspecified: Secondary | ICD-10-CM | POA: Diagnosis not present

## 2011-10-28 DIAGNOSIS — I720 Aneurysm of carotid artery: Secondary | ICD-10-CM | POA: Diagnosis not present

## 2011-10-28 DIAGNOSIS — I9589 Other hypotension: Secondary | ICD-10-CM | POA: Diagnosis not present

## 2011-10-29 ENCOUNTER — Other Ambulatory Visit (INDEPENDENT_AMBULATORY_CARE_PROVIDER_SITE_OTHER): Payer: Medicare Other

## 2011-10-29 DIAGNOSIS — E785 Hyperlipidemia, unspecified: Secondary | ICD-10-CM

## 2011-10-29 LAB — HEPATIC FUNCTION PANEL
Bilirubin, Direct: 0 mg/dL (ref 0.0–0.3)
Total Bilirubin: 0.4 mg/dL (ref 0.3–1.2)

## 2011-10-29 LAB — BASIC METABOLIC PANEL
BUN: 13 mg/dL (ref 6–23)
Chloride: 103 mEq/L (ref 96–112)
Creatinine, Ser: 0.9 mg/dL (ref 0.4–1.2)
Glucose, Bld: 77 mg/dL (ref 70–99)
Potassium: 4.5 mEq/L (ref 3.5–5.1)

## 2011-10-29 LAB — LIPID PANEL
LDL Cholesterol: 65 mg/dL (ref 0–99)
Total CHOL/HDL Ratio: 2
VLDL: 12.6 mg/dL (ref 0.0–40.0)

## 2011-11-05 ENCOUNTER — Ambulatory Visit (INDEPENDENT_AMBULATORY_CARE_PROVIDER_SITE_OTHER): Payer: Medicare Other | Admitting: Nurse Practitioner

## 2011-11-05 ENCOUNTER — Encounter: Payer: Self-pay | Admitting: Nurse Practitioner

## 2011-11-05 VITALS — BP 110/64 | HR 64 | Ht 67.5 in | Wt 136.8 lb

## 2011-11-05 DIAGNOSIS — I951 Orthostatic hypotension: Secondary | ICD-10-CM | POA: Diagnosis not present

## 2011-11-05 NOTE — Progress Notes (Signed)
Sarah [name] Date of Birth: 07/01/1921 Medical Record #[id number]  History of Present Illness: Sarah [name] is seen back today for a 2 week check. She is seen for Dr. Elease Hashimoto. She is 89. She has HTN, asthma, recurrent bronchitis, depression, OA, and hypothyroidism. She had a NSTEMI in August of last year with cath showing no obstructive CAD. Negative for PE as well. Has been managed medically. Also has bronchiectasis and MAC is followed by Dr. Solon Augusta. She has had recurrent bronchitis here recently. Had been treated with 2 rounds of prednisone and antibiotics. She became quite hypotensive and had been for several weeks. I stopped her Norvasc and her Lisinopril and changed her Metoprolol to only take if her readings were above 120 systolic. Salt use was encouraged. I limited her driving. The Metoprolol was stopped altogether at her last visit.  She comes back today. She is here alone. She is doing ok. Has some good days and bad days. She is back driving some. Blood pressure is satisfactory. Her readings are reviewed and average 110 to 130 systolic. She has had some rare readings in the 140's but nothing consistent. She is off all her blood pressure medicine but has taken a metoprolol or lisinopril once or twice but nothing regular. No chest pain reported. Her breathing has improved with the addition of her Symbicort.   Current Outpatient Prescriptions on File Prior to Visit  Medication Sig Dispense Refill  . acetaminophen-codeine (TYLENOL #3) 300-30 MG per tablet Take 1 tablet by mouth every 4 (four) hours as needed. For pain      . albuterol (PROVENTIL HFA;VENTOLIN HFA) 108 (90 BASE) MCG/ACT inhaler Inhale 2 puffs into the lungs every 6 (six) hours as needed. For shortness of breath      . aspirin EC 81 MG tablet Take 81 mg by mouth daily.      . budesonide-formoterol (SYMBICORT) 160-4.5 MCG/ACT inhaler Inhale 2 puffs into the lungs 2 (two) times daily.  1 Inhaler  12  . citalopram (CELEXA) 40 MG tablet  Take 20 mg by mouth daily.       Marland Kitchen estradiol (VIVELLE-DOT) 0.05 MG/24HR Place 1 patch (0.05 mg total) onto the skin once a week. Apply on Thursday  8 patch  11  . fluticasone (FLONASE) 50 MCG/ACT nasal spray Place 2 sprays into the nose daily as needed. For congestion      . isosorbide mononitrate (IMDUR) 30 MG 24 hr tablet Take 1 tablet (30 mg total) by mouth daily.  30 tablet  11  . levothyroxine (SYNTHROID, LEVOTHROID) 50 MCG tablet Take 50 mcg by mouth daily.        . montelukast (SINGULAIR) 10 MG tablet Take 10 mg by mouth at bedtime.        . nitroGLYCERIN (NITROSTAT) 0.4 MG SL tablet Place 0.4 mg under the tongue every 5 (five) minutes as needed. For chest pain      . pregabalin (LYRICA) 50 MG capsule Take 50 mg by mouth 2 (two) times daily.       . simvastatin (ZOCOR) 20 MG tablet Take 1 tablet (20 mg total) by mouth every evening.  30 tablet  5    Allergies  Allergen Reactions  . Hydrocodone Nausea And Vomiting and Other (See Comments)    Passed out, threw up, blood pressure dropped  . Levofloxacin Other (See Comments)    Pass out  . Oxycodone Other (See Comments)    Rapid heart beat  . Clarithromycin Other (See Comments)  unknown    Past Medical History  Diagnosis Date  . Bronchiectasis     with history of Legionaires with chronic rales/rhonchi  . Pollen allergies   . Asthma   . Hypertension     Negative renal duplex in December of 2012  . Allergic rhinitis   . NSTEMI (non-ST elevated myocardial infarction)     a. Normal coronaries per cath August 2012 and negative CT angio for PE;  b. 06/2011 Repeat admission w/ chest pain and elevated troponin's, CTA Chest w/o PE  . Diastolic dysfunction     a. per echo 08/2010 with normal LV function;  b. 06/2011 Echo: EF 65-70%  . Idiopathic peripheral neuropathy   . Hypothyroidism   . Depression   . Shortness of breath   . Pneumonia     hx of pna  . H/O hiatal hernia   . Arthritis   . Osteopenia 02/2011    t score - 2.1     Past Surgical History  Procedure Date  . Dilation and curettage of uterus   . Total abdominal hysterectomy w/ bilateral salpingoophorectomy 1972    leiomyomata, menorrhagia  . Appendectomy   . Cardiac catheterization August 2012    Normal coronaries.  . Tonsillectomy     History  Smoking status  . Never Smoker   Smokeless tobacco  . Never Used    History  Alcohol Use  . Yes    wine 1-2 week    Family History  Problem Relation Age of Onset  . Hypertension Mother     stroke  . Cancer Brother     prostrate    Review of Systems: The review of systems is per the HPI.  All other systems were reviewed and are negative.  Physical Exam: BP 110/64  Pulse 64  Ht 5' 7.5" (1.715 m)  Wt 136 lb 12.8 oz (62.052 kg)  BMI 21.11 kg/m2  LMP 01/14/1970 Patient is very pleasant and in no acute distress. Skin is warm and dry. Color is normal.  HEENT is unremarkable. Normocephalic/atraumatic. PERRL. Sclera are nonicteric. Neck is supple. No masses. No JVD. Lungs show some fine rales which are chronic. Cardiac exam shows a regular rate and rhythm. Abdomen is soft. Extremities are without edema. Gait and ROM are intact. No gross neurologic deficits noted.  LABORATORY DATA:  Lab Results  Component Value Date   WBC 9.2 10/18/2011   HGB 12.5 10/18/2011   HCT 39.1 10/18/2011   PLT 298.0 10/18/2011   GLUCOSE 77 10/29/2011   CHOL 143 10/29/2011   TRIG 63.0 10/29/2011   HDL 65.40 10/29/2011   LDLCALC 65 10/29/2011   ALT 13 10/29/2011   AST 24 10/29/2011   NA 138 10/29/2011   K 4.5 10/29/2011   CL 103 10/29/2011   CREATININE 0.9 10/29/2011   BUN 13 10/29/2011   CO2 27 10/29/2011   TSH 2.940 06/19/2011   INR 1.00 06/19/2011   HGBA1C 6.1* 09/06/2010     Assessment / Plan: 1. Hypotension - this is now resolved. Will leave her off her blood pressure medicines. She will continue to monitor at home and bring her readings in for review.   2. Bronchitis - with known MAC - has follow up  with Dr. Delton Coombes  We will see her back in December with Dr. Elease Hashimoto.. Patient is agreeable to this plan and will call if any problems develop in the interim.

## 2011-11-05 NOTE — Patient Instructions (Addendum)
I think you are doing ok.  Continue to monitor your blood pressure at home.  Bring your readings for review to your next visit with Dr. Elease Hashimoto.  Call the [facility] office at [phone] if you have any questions, problems or concerns.

## 2011-11-11 ENCOUNTER — Ambulatory Visit (INDEPENDENT_AMBULATORY_CARE_PROVIDER_SITE_OTHER): Payer: Medicare Other | Admitting: Emergency Medicine

## 2011-11-11 ENCOUNTER — Encounter: Payer: Self-pay | Admitting: Emergency Medicine

## 2011-11-11 VITALS — BP 160/90 | HR 86 | Temp 97.7°F | Ht 67.5 in | Wt 138.4 lb

## 2011-11-11 DIAGNOSIS — J479 Bronchiectasis, uncomplicated: Secondary | ICD-10-CM

## 2011-11-11 NOTE — Patient Instructions (Addendum)
Please continue your Symbicort as you are taking  Continue your singulair and fluticasone spray We will repeat your CT scan of the chest in February 2014 Follow with Dr Delton Coombes in February after your CT scan

## 2011-11-11 NOTE — Assessment & Plan Note (Addendum)
Her cough is better on the symbicort. Of note she was treated for bronchitis in the interim. We have not treated for possible MAIC.  - Will continue the symbicort - needs a repeat CT scan of the chest in February 2014 - continue fluticasone and singulair -

## 2011-11-11 NOTE — Progress Notes (Signed)
78 yo never smoker, hx remote Legionella PNA, diagnosed with suspected asthma about 10 years ago. PFT's 7/10 with mixed disease, mild AFL. Formerly treated with Advair and albuterol, stopped when she moved to [place] in 2008. Tolerated the change well. Stayed on Singulair. At original consult we d/c'd ACE-I  ROV 08/24/10 --  bronchiectasis, mild AFL. Regular f/u. Coughing less, occasionally some dark sputum. CXR done July showed some scar and ? Nodularity. In general she is feeling well. No abx since last visit. Has not been needing flutter valve every day.   ROV 10/16/10 -- follow up for her bronchiectasis, mild AFL. Unfortunately since last visit she has an MI, managed by Dr Elease Hashimoto, hospitalized in 8/12, coronaries were clean. She was restarted on lisinopril (low dose), has restarted the dry cough. Also has the other stable cough that produces plugs, stable.   ROV 03/28/11 -- bronchiectasis, mild AFL, CAD. She had been doing well this winter until about 2 weeks ago when she developed fatigue, some low grade fever, dry paroxysms of cough, some clear sputum. Her plugs decreased for a while, now they are back. She had a single episode of wheeze that she treated with SABA. She believes that she is pretty much over this illness.   ROV/Hosp f/u 07/29/11 -- f/u visit for bronchiectasis, chronic cough, mild AFL. She also has CAD being medically managed by Dr Elease Hashimoto. She was admitted to [facility] with apparent NSTEMI 6/13. CT scan was done on that admission - shows bronchiectasis and tree-bud pattern as below, ? MAIC. Tells me she has been feeling bad x 2 weeks, went to ED over the weekend - chills, nausea, fatigue, dyspnea, dry cough. The SABA doesn't make her SOB much better. They rx her with prednisone + Bactrim. She does feel a bit better. She is coughing up green sputum.   ROV 08/30/11 -- hx bronchiectasis, chronic coughing, CAD. CT scan consistent w MAIC. Returns today after finishing her Bactrim and prednisone. She felt  better after these meds, but now still has episodes of weakness, fatigue, ? Palpitations. She is concerned that she is having difficulty with her BP regimen. She doesn't want to start empiric abx right now.   ROV 11/11/11 --  hx bronchiectasis, chronic coughing, CAD. CT scan consistent w MAIC. We have discussed empiric MAIC therapy, but decided to defer for now. She tells me that she was treated for bronchitis recently with abx + pred. Last time we added symbicort, she feels that it is helping her >> her cough is better.    Filed Vitals:   11/11/11 1551  BP: 160/90  Pulse: 86  Temp: 97.7 F (36.5 C)    Gen: Pleasant, well-nourished, in no distress,  normal affect  ENT: No lesions,  mouth clear,  oropharynx clear, no postnasal drip  Neck: No JVD, no TMG, no carotid bruits  Lungs: No use of accessory muscles, no dullness to percussion, few scattered insp crackles L>>R  Cardiovascular: RRR, heart sounds normal, no murmur or gallops, no peripheral edema  Musculoskeletal: No deformities, no cyanosis or clubbing  Neuro: alert, non focal  Skin: Warm, no lesions or rashes   CT chest 06/19/11:  Comparison: 09/05/2010.  Findings: The chest wall is unremarkable. No breast masses,  supraclavicular or axillary lymphadenopathy. The bony thorax is  intact. No destructive bone lesions or spinal canal compromise.  The heart is normal in size. No pericardial effusion. No  mediastinal or hilar lymphadenopathy. Stable mediastinal lymph  nodes. The aorta is normal  in caliber. No dissection. The  esophagus is grossly normal. Stable large hiatal hernia.  The pulmonary arterial tree is fairly well opacified. No filling  defects to suggest pulmonary emboli.  Examination of the lung parenchyma demonstrates patchy areas of  subpleural scarring changes and bronchiectasis. No worrisome  infiltrates or mass lesions. New patchy area of tree in bud  appearance in the right middle lobe typically seen with  atypical  infections (MAC). No pleural effusion or pulmonary edema.  The upper abdomen is unremarkable.  IMPRESSION:  1. Stable areas of subsegmental atelectasis and scarring with  bronchiectasis.  2. New areas of tree in bud appearance most likely atypical  infection (MAC).  3. Stable mediastinal and hilar lymph nodes.  4. No findings for pulmonary embolism.  5. Stable hiatal hernia.    Bronchiectasis Her cough is better on the symbicort. Of note she was treated for bronchitis in the interim. We have not treated for possible MAIC.  - Will continue the symbicort - needs a repeat CT scan of the chest in February 2014 - continue fluticasone and singulair -

## 2011-11-22 ENCOUNTER — Encounter (HOSPITAL_COMMUNITY): Payer: Self-pay | Admitting: Emergency Medicine

## 2011-11-22 ENCOUNTER — Telehealth: Payer: Self-pay | Admitting: Cardiovascular Disease

## 2011-11-22 ENCOUNTER — Emergency Department (HOSPITAL_COMMUNITY): Payer: Medicare Other

## 2011-11-22 ENCOUNTER — Observation Stay (HOSPITAL_COMMUNITY)
Admission: EM | Admit: 2011-11-22 | Discharge: 2011-11-24 | Disposition: A | Discharge status: Home / Self Care | Payer: Medicare Other | Attending: Internal Medicine | Admitting: Internal Medicine

## 2011-11-22 DIAGNOSIS — IMO0001 Reserved for inherently not codable concepts without codable children: Secondary | ICD-10-CM | POA: Diagnosis present

## 2011-11-22 DIAGNOSIS — I10 Essential (primary) hypertension: Secondary | ICD-10-CM | POA: Diagnosis not present

## 2011-11-22 DIAGNOSIS — J479 Bronchiectasis, uncomplicated: Secondary | ICD-10-CM | POA: Diagnosis not present

## 2011-11-22 DIAGNOSIS — M899 Disorder of bone, unspecified: Secondary | ICD-10-CM | POA: Insufficient documentation

## 2011-11-22 DIAGNOSIS — R0789 Other chest pain: Secondary | ICD-10-CM

## 2011-11-22 DIAGNOSIS — I214 Non-ST elevation (NSTEMI) myocardial infarction: Secondary | ICD-10-CM | POA: Diagnosis not present

## 2011-11-22 DIAGNOSIS — R6889 Other general symptoms and signs: Secondary | ICD-10-CM | POA: Diagnosis not present

## 2011-11-22 DIAGNOSIS — E039 Hypothyroidism, unspecified: Secondary | ICD-10-CM

## 2011-11-22 DIAGNOSIS — R55 Syncope and collapse: Secondary | ICD-10-CM | POA: Diagnosis not present

## 2011-11-22 DIAGNOSIS — J45909 Unspecified asthma, uncomplicated: Secondary | ICD-10-CM

## 2011-11-22 DIAGNOSIS — I252 Old myocardial infarction: Secondary | ICD-10-CM | POA: Insufficient documentation

## 2011-11-22 DIAGNOSIS — R0602 Shortness of breath: Secondary | ICD-10-CM | POA: Insufficient documentation

## 2011-11-22 DIAGNOSIS — G609 Hereditary and idiopathic neuropathy, unspecified: Secondary | ICD-10-CM

## 2011-11-22 DIAGNOSIS — F411 Generalized anxiety disorder: Secondary | ICD-10-CM | POA: Diagnosis not present

## 2011-11-22 DIAGNOSIS — R05 Cough: Secondary | ICD-10-CM

## 2011-11-22 DIAGNOSIS — F329 Major depressive disorder, single episode, unspecified: Secondary | ICD-10-CM

## 2011-11-22 DIAGNOSIS — F419 Anxiety disorder, unspecified: Secondary | ICD-10-CM | POA: Diagnosis present

## 2011-11-22 DIAGNOSIS — R072 Precordial pain: Secondary | ICD-10-CM | POA: Diagnosis not present

## 2011-11-22 DIAGNOSIS — R000 Tachycardia, unspecified: Secondary | ICD-10-CM

## 2011-11-22 DIAGNOSIS — R059 Cough, unspecified: Secondary | ICD-10-CM

## 2011-11-22 DIAGNOSIS — M1990 Unspecified osteoarthritis, unspecified site: Secondary | ICD-10-CM

## 2011-11-22 DIAGNOSIS — Z9109 Other allergy status, other than to drugs and biological substances: Secondary | ICD-10-CM

## 2011-11-22 DIAGNOSIS — I119 Hypertensive heart disease without heart failure: Secondary | ICD-10-CM | POA: Diagnosis not present

## 2011-11-22 DIAGNOSIS — R079 Chest pain, unspecified: Principal | ICD-10-CM

## 2011-11-22 DIAGNOSIS — M129 Arthropathy, unspecified: Secondary | ICD-10-CM | POA: Insufficient documentation

## 2011-11-22 DIAGNOSIS — Z79899 Other long term (current) drug therapy: Secondary | ICD-10-CM | POA: Insufficient documentation

## 2011-11-22 DIAGNOSIS — F32A Depression, unspecified: Secondary | ICD-10-CM

## 2011-11-22 DIAGNOSIS — J309 Allergic rhinitis, unspecified: Secondary | ICD-10-CM

## 2011-11-22 DIAGNOSIS — R7989 Other specified abnormal findings of blood chemistry: Secondary | ICD-10-CM

## 2011-11-22 LAB — CBC
HCT: 35.5 % — ABNORMAL LOW (ref 36.0–46.0)
Hemoglobin: 11.5 g/dL — ABNORMAL LOW (ref 12.0–15.0)
MCH: 28.4 pg (ref 26.0–34.0)
MCV: 87.7 fL (ref 78.0–100.0)
RBC: 4.05 MIL/uL (ref 3.87–5.11)

## 2011-11-22 LAB — BASIC METABOLIC PANEL
BUN: 15 mg/dL (ref 6–23)
CO2: 24 mEq/L (ref 19–32)
Calcium: 8.7 mg/dL (ref 8.4–10.5)
Creatinine, Ser: 0.94 mg/dL (ref 0.50–1.10)
Glucose, Bld: 92 mg/dL (ref 70–99)
Sodium: 139 mEq/L (ref 135–145)

## 2011-11-22 LAB — URINALYSIS, ROUTINE W REFLEX MICROSCOPIC
Bilirubin Urine: NEGATIVE
Nitrite: NEGATIVE
Specific Gravity, Urine: 1.01 (ref 1.005–1.030)
pH: 7 (ref 5.0–8.0)

## 2011-11-22 LAB — POCT I-STAT TROPONIN I

## 2011-11-22 LAB — URINE MICROSCOPIC-ADD ON

## 2011-11-22 MED ORDER — ASPIRIN 325 MG PO TABS
325.0000 mg | ORAL_TABLET | ORAL | Status: AC
  Administered 2011-11-22: 325 mg via ORAL
  Filled 2011-11-22: qty 1

## 2011-11-22 MED ORDER — NITROGLYCERIN 0.4 MG SL SUBL
0.4000 mg | SUBLINGUAL_TABLET | SUBLINGUAL | Status: DC | PRN
  Filled 2011-11-22: qty 25

## 2011-11-22 NOTE — Telephone Encounter (Signed)
Per pt daughter pt has been taken to [facility] hospital for elevated b/p

## 2011-11-22 NOTE — ED Notes (Signed)
Pt arrived by EMS. Pt c/o weakness and "near faint". Pt stated "I felt like I was going to fade away." When EMS arrived BP 212/114 HR110. Pt c/o CP on scene but denies CP at this time. Pt is also hard of hearing. Family at bedside.

## 2011-11-22 NOTE — Telephone Encounter (Signed)
40 minutes later her daughter took her to er per other note msg.

## 2011-11-22 NOTE — ED Notes (Signed)
Pt reports having CP at approx 1630 this afternoon, Pt reports taking "one nitro", and states pain decreased.  EDP aware.

## 2011-11-22 NOTE — ED Provider Notes (Signed)
History     CSN: [id number]  Arrival date & time 11/22/11  1730   First MD Initiated Contact with Patient 11/22/11 2100      Chief Complaint  Patient presents with  . Near Syncope  . Fatigue    (Consider location/radiation/quality/duration/timing/severity/associated sxs/prior treatment) HPI Comments: This is an 78 year old female, who presents to the emergency department with a chief complaint of chest pain. Patient states that she had an episode of chest pain earlier this evening, which resolved after taking one sublingual nitroglycerin. She states that she also had associated tiredness, and feeling as though "all of her cells were in distress." She reports that at that time she had a blood pressure in the 200s. She has a past medical history remarkable for hypertension, COPD, previous MI in 2012. She denies being in any pain at this time. She has no neurologic deficits.   The history is provided by the patient. No language interpreter was used.    Past Medical History  Diagnosis Date  . Bronchiectasis     with history of Legionaires with chronic rales/rhonchi  . Pollen allergies   . Asthma   . Hypertension     Negative renal duplex in December of 2012  . Allergic rhinitis   . NSTEMI (non-ST elevated myocardial infarction)     a. Normal coronaries per cath August 2012 and negative CT angio for PE;  b. 06/2011 Repeat admission w/ chest pain and elevated troponin's, CTA Chest w/o PE  . Diastolic dysfunction     a. per echo 08/2010 with normal LV function;  b. 06/2011 Echo: EF 65-70%  . Idiopathic peripheral neuropathy   . Hypothyroidism   . Depression   . Shortness of breath   . Pneumonia     hx of pna  . H/O hiatal hernia   . Arthritis   . Osteopenia 02/2011    t score - 2.1    Past Surgical History  Procedure Date  . Dilation and curettage of uterus   . Total abdominal hysterectomy w/ bilateral salpingoophorectomy 1972    leiomyomata, menorrhagia  . Appendectomy   .  Cardiac catheterization August 2012    Normal coronaries.  . Tonsillectomy     Family History  Problem Relation Age of Onset  . Hypertension Mother     stroke  . Cancer Brother     prostrate    History  Substance Use Topics  . Smoking status: Never Smoker   . Smokeless tobacco: Never Used  . Alcohol Use: Yes     Comment: wine 1-2 week    OB History    Grav Para Term Preterm Abortions TAB SAB Ect Mult Living   2 2        2       Review of Systems  All other systems reviewed and are negative.    Allergies  Hydrocodone; Levofloxacin; Oxycodone; and Clarithromycin  Home Medications   Current Outpatient Rx  Name  Route  Sig  Dispense  Refill  . ACETAMINOPHEN-CODEINE #3 300-30 MG PO TABS   Oral   Take 1 tablet by mouth every 4 (four) hours as needed. For pain         . ALBUTEROL SULFATE HFA 108 (90 BASE) MCG/ACT IN AERS   Inhalation   Inhale 2 puffs into the lungs every 6 (six) hours as needed. For shortness of breath         . ASPIRIN EC 81 MG PO TBEC   Oral  Take 81 mg by mouth daily.         . BUDESONIDE-FORMOTEROL FUMARATE 160-4.5 MCG/ACT IN AERO   Inhalation   Inhale 2 puffs into the lungs 2 (two) times daily.   1 Inhaler   12   . CITALOPRAM HYDROBROMIDE 40 MG PO TABS   Oral   Take 20 mg by mouth daily.          Marland Kitchen ESTRADIOL 0.05 MG/24HR TD PTTW   Transdermal   Place 1 patch (0.05 mg total) onto the skin once a week. Apply on Thursday   8 patch   11   . FLUTICASONE PROPIONATE 50 MCG/ACT NA SUSP   Nasal   Place 2 sprays into the nose daily as needed. For congestion         . ISOSORBIDE MONONITRATE ER 30 MG PO TB24   Oral   Take 1 tablet (30 mg total) by mouth daily.   30 tablet   11   . LEVOTHYROXINE SODIUM 50 MCG PO TABS   Oral   Take 50 mcg by mouth daily.           Marland Kitchen METOPROLOL TARTRATE 25 MG PO TABS   Oral   Take 12.5 mg by mouth 2 (two) times daily as needed.         Marland Kitchen MONTELUKAST SODIUM 10 MG PO TABS   Oral   Take  10 mg by mouth daily as needed. For allergies         . NITROGLYCERIN 0.4 MG SL SUBL   Sublingual   Place 0.4 mg under the tongue every 5 (five) minutes as needed. For chest pain         . PREGABALIN 50 MG PO CAPS   Oral   Take 50 mg by mouth 2 (two) times daily.          Marland Kitchen SIMVASTATIN 20 MG PO TABS   Oral   Take 1 tablet (20 mg total) by mouth every evening.   30 tablet   5     BP 154/71  Pulse 79  Temp 97.9 F (36.6 C) (Oral)  Resp 15  SpO2 96%  LMP 01/14/1970  Physical Exam  Nursing note and vitals reviewed. Constitutional: She is oriented to person, place, and time. She appears well-developed and well-nourished.  HENT:  Head: Normocephalic and atraumatic.  Eyes: Conjunctivae normal and EOM are normal. Pupils are equal, round, and reactive to light.  Neck: Normal range of motion. Neck supple.  Cardiovascular: Normal rate and regular rhythm.  Exam reveals no gallop and no friction rub.   No murmur heard. Pulmonary/Chest: Effort normal. No respiratory distress. She has wheezes. She has no rales. She exhibits no tenderness.       Some diffuse wheezes.  Abdominal: Soft. Bowel sounds are normal. She exhibits no distension and no mass. There is no tenderness. There is no rebound and no guarding.  Musculoskeletal: Normal range of motion. She exhibits no edema and no tenderness.  Neurological: She is alert and oriented to person, place, and time.       CN 3-12 intact  Skin: Skin is warm and dry.  Psychiatric: She has a normal mood and affect. Her behavior is normal. Judgment and thought content normal.    ED Course  Procedures (including critical care time)   Labs Reviewed  CBC  BASIC METABOLIC PANEL   Dg Chest Port 1 View  11/22/2011  *RADIOLOGY REPORT*  Clinical Data: Near-syncope  PORTABLE CHEST -  1 VIEW  Comparison: 10/10/2011  Findings: Cardiomediastinal silhouette is stable.  No acute infiltrate or pleural effusion.  No pulmonary edema.  Hiatal hernia  again noted.  IMPRESSION: No active disease.  No significant change.   Original Report Authenticated By: Natasha Mead, M.D.    Results for orders placed during the hospital encounter of 11/22/11  CBC      Component Value Range   WBC 10.4  4.0 - 10.5 K/uL   RBC 4.05  3.87 - 5.11 MIL/uL   Hemoglobin 11.5 (*) 12.0 - 15.0 g/dL   HCT 21.3 (*) 08.6 - 57.8 %   MCV 87.7  78.0 - 100.0 fL   MCH 28.4  26.0 - 34.0 pg   MCHC 32.4  30.0 - 36.0 g/dL   RDW 46.9  62.9 - 52.8 %   Platelets 238  150 - 400 K/uL  BASIC METABOLIC PANEL      Component Value Range   Sodium 139  135 - 145 mEq/L   Potassium 4.0  3.5 - 5.1 mEq/L   Chloride 103  96 - 112 mEq/L   CO2 24  19 - 32 mEq/L   Glucose, Bld 92  70 - 99 mg/dL   BUN 15  6 - 23 mg/dL   Creatinine, Ser 4.13  0.50 - 1.10 mg/dL   Calcium 8.7  8.4 - 24.4 mg/dL   GFR calc non Af Amer 53 (*) >90 mL/min   GFR calc Af Amer 61 (*) >90 mL/min  POCT I-STAT TROPONIN I      Component Value Range   Troponin i, poc 0.04  0.00 - 0.08 ng/mL   Comment 3           URINALYSIS, ROUTINE W REFLEX MICROSCOPIC      Component Value Range   Color, Urine YELLOW  YELLOW   APPearance CLOUDY (*) CLEAR   Specific Gravity, Urine 1.010  1.005 - 1.030   pH 7.0  5.0 - 8.0   Glucose, UA NEGATIVE  NEGATIVE mg/dL   Hgb urine dipstick NEGATIVE  NEGATIVE   Bilirubin Urine NEGATIVE  NEGATIVE   Ketones, ur NEGATIVE  NEGATIVE mg/dL   Protein, ur NEGATIVE  NEGATIVE mg/dL   Urobilinogen, UA 0.2  0.0 - 1.0 mg/dL   Nitrite NEGATIVE  NEGATIVE   Leukocytes, UA TRACE (*) NEGATIVE  URINE MICROSCOPIC-ADD ON      Component Value Range   Squamous Epithelial / LPF FEW (*) RARE   WBC, UA 3-6  <3 WBC/hpf   RBC / HPF 0-2  <3 RBC/hpf   Bacteria, UA RARE  RARE   Urine-Other RARE YEAST     Gna Rad Results  10/30/2011  Ordered by an unspecified provider.   Gna Rad Results  10/30/2011  Ordered by an unspecified provider.   Dg Chest Port 1 View  11/22/2011  *RADIOLOGY REPORT*  Clinical Data:  Near-syncope  PORTABLE CHEST - 1 VIEW  Comparison: 10/10/2011  Findings: Cardiomediastinal silhouette is stable.  No acute infiltrate or pleural effusion.  No pulmonary edema.  Hiatal hernia again noted.  IMPRESSION: No active disease.  No significant change.   Original Report Authenticated By: Natasha Mead, M.D.      ED ECG REPORT  I personally interpreted this EKG   Date: 11/22/2011   Rate: 66  Rhythm: normal sinus rhythm  QRS Axis: normal  Intervals: normal  ST/T Wave abnormalities: normal  Conduction Disutrbances:none  Narrative Interpretation:   Old EKG Reviewed: unchanged    No  diagnosis found.    MDM  78 year old female with chest pain.  Patient received aspirin on arrival to the ED.  She used 1 SL nitro before coming to the ED, which alleviated her symptoms.  Patient has been seen by and discussed with Dr. Ethelda Chick.    Patient is going to be admitted.        Roxy Horseman, PA-C 11/23/11 0110

## 2011-11-22 NOTE — ED Provider Notes (Signed)
Complains of generalized weakness and anterior chest pressure onset this afternoon. She treated herself with one sublingual nitroglycerin with relief. Presently chest discomfort is mild. Patient had cardiac catheterization August 2012 showing no evidence of coronary disease. Has history ofNSTEMI On exam patient is alert Glasgow Coma Score 15 lungs clear auscultation heart regular rate and rhythm she occasionally winces in pain, with symptoms lasting up a split second. Spoke with Dr.Doutova who will evaluate patient for admission or 23 hour observation. Doubt acute coronary syndrome with negative cardiac catheterization August 2012, negative cardiac markers, atypical symptoms, nonacute EKG Diagnosis chest pain  Doug Sou, MD 11/23/11 0122

## 2011-11-22 NOTE — Telephone Encounter (Signed)
Noted  

## 2011-11-22 NOTE — Telephone Encounter (Signed)
New Problem:    Patient called in because her BP has spiked to 185/96 and she feels faint and she had a question about her medication.  Please call back.

## 2011-11-23 ENCOUNTER — Other Ambulatory Visit: Payer: Self-pay

## 2011-11-23 ENCOUNTER — Encounter (HOSPITAL_COMMUNITY): Payer: Self-pay | Admitting: Internal Medicine

## 2011-11-23 ENCOUNTER — Observation Stay (HOSPITAL_COMMUNITY): Payer: Medicare Other

## 2011-11-23 DIAGNOSIS — F411 Generalized anxiety disorder: Secondary | ICD-10-CM | POA: Diagnosis not present

## 2011-11-23 DIAGNOSIS — J479 Bronchiectasis, uncomplicated: Secondary | ICD-10-CM | POA: Diagnosis not present

## 2011-11-23 DIAGNOSIS — F419 Anxiety disorder, unspecified: Secondary | ICD-10-CM | POA: Diagnosis present

## 2011-11-23 DIAGNOSIS — R079 Chest pain, unspecified: Secondary | ICD-10-CM | POA: Diagnosis not present

## 2011-11-23 DIAGNOSIS — R072 Precordial pain: Secondary | ICD-10-CM

## 2011-11-23 DIAGNOSIS — I10 Essential (primary) hypertension: Secondary | ICD-10-CM | POA: Diagnosis not present

## 2011-11-23 DIAGNOSIS — E039 Hypothyroidism, unspecified: Secondary | ICD-10-CM

## 2011-11-23 DIAGNOSIS — IMO0001 Reserved for inherently not codable concepts without codable children: Secondary | ICD-10-CM | POA: Diagnosis present

## 2011-11-23 LAB — COMPREHENSIVE METABOLIC PANEL
ALT: 10 U/L (ref 0–35)
AST: 24 U/L (ref 0–37)
Calcium: 9.2 mg/dL (ref 8.4–10.5)
Sodium: 139 mEq/L (ref 135–145)
Total Protein: 7 g/dL (ref 6.0–8.3)

## 2011-11-23 LAB — CBC
HCT: 37.1 % (ref 36.0–46.0)
Hemoglobin: 12 g/dL (ref 12.0–15.0)
MCH: 28.2 pg (ref 26.0–34.0)
MCHC: 32.3 g/dL (ref 30.0–36.0)

## 2011-11-23 LAB — MAGNESIUM: Magnesium: 2 mg/dL (ref 1.5–2.5)

## 2011-11-23 LAB — TSH: TSH: 3.321 u[IU]/mL (ref 0.350–4.500)

## 2011-11-23 MED ORDER — ZOLPIDEM TARTRATE 5 MG PO TABS: 5.0000 mg | ORAL_TABLET | Freq: Every evening | ORAL | Status: DC | PRN

## 2011-11-23 MED ORDER — PREGABALIN 50 MG PO CAPS
50.0000 mg | ORAL_CAPSULE | Freq: Two times a day (BID) | ORAL | Status: DC
  Administered 2011-11-23 – 2011-11-24 (×3): 50 mg via ORAL
  Filled 2011-11-23 (×3): qty 1

## 2011-11-23 MED ORDER — ENSURE PUDDING PO PUDG
1.0000 | Freq: Three times a day (TID) | ORAL | Status: DC
  Administered 2011-11-24: 1 via ORAL

## 2011-11-23 MED ORDER — BUDESONIDE-FORMOTEROL FUMARATE 160-4.5 MCG/ACT IN AERO
2.0000 | INHALATION_SPRAY | Freq: Two times a day (BID) | RESPIRATORY_TRACT | Status: DC
  Administered 2011-11-23 (×2): 2 via RESPIRATORY_TRACT
  Filled 2011-11-23: qty 6

## 2011-11-23 MED ORDER — ISOSORBIDE MONONITRATE ER 30 MG PO TB24
30.0000 mg | ORAL_TABLET | Freq: Every day | ORAL | Status: DC
  Administered 2011-11-23 – 2011-11-24 (×2): 30 mg via ORAL
  Filled 2011-11-23 (×2): qty 1

## 2011-11-23 MED ORDER — ENOXAPARIN SODIUM 40 MG/0.4ML ~~LOC~~ SOLN
40.0000 mg | SUBCUTANEOUS | Status: DC
  Administered 2011-11-23: 40 mg via SUBCUTANEOUS
  Filled 2011-11-23 (×2): qty 0.4

## 2011-11-23 MED ORDER — ALPRAZOLAM 0.25 MG PO TABS
0.2500 mg | ORAL_TABLET | Freq: Two times a day (BID) | ORAL | Status: DC | PRN
  Administered 2011-11-23: 0.25 mg via ORAL
  Filled 2011-11-23: qty 1

## 2011-11-23 MED ORDER — DOCUSATE SODIUM 100 MG PO CAPS
100.0000 mg | ORAL_CAPSULE | Freq: Two times a day (BID) | ORAL | Status: DC
  Administered 2011-11-23 (×2): 100 mg via ORAL
  Filled 2011-11-23 (×3): qty 1

## 2011-11-23 MED ORDER — ALPRAZOLAM 0.25 MG PO TABS
0.2500 mg | ORAL_TABLET | Freq: Three times a day (TID) | ORAL | Status: DC
  Administered 2011-11-23 – 2011-11-24 (×3): 0.25 mg via ORAL
  Filled 2011-11-23 (×3): qty 1

## 2011-11-23 MED ORDER — NITROGLYCERIN 0.4 MG SL SUBL: 0.4000 mg | SUBLINGUAL_TABLET | SUBLINGUAL | Status: DC | PRN

## 2011-11-23 MED ORDER — METOPROLOL TARTRATE 12.5 MG HALF TABLET
12.5000 mg | ORAL_TABLET | Freq: Two times a day (BID) | ORAL | Status: DC
  Administered 2011-11-23 – 2011-11-24 (×3): 12.5 mg via ORAL
  Filled 2011-11-23 (×4): qty 1

## 2011-11-23 MED ORDER — ACETAMINOPHEN-CODEINE #3 300-30 MG PO TABS
1.0000 | ORAL_TABLET | ORAL | Status: DC | PRN
  Administered 2011-11-23: 1 via ORAL
  Filled 2011-11-23: qty 1

## 2011-11-23 MED ORDER — HYDRALAZINE HCL 20 MG/ML IJ SOLN
10.0000 mg | INTRAMUSCULAR | Status: DC | PRN
  Filled 2011-11-23: qty 0.5

## 2011-11-23 MED ORDER — LEVOTHYROXINE SODIUM 50 MCG PO TABS
50.0000 ug | ORAL_TABLET | Freq: Every day | ORAL | Status: DC
  Administered 2011-11-23 – 2011-11-24 (×2): 50 ug via ORAL
  Filled 2011-11-23 (×3): qty 1

## 2011-11-23 MED ORDER — ONDANSETRON HCL 4 MG/2ML IJ SOLN: 4.0000 mg | Freq: Four times a day (QID) | INTRAMUSCULAR | Status: DC | PRN

## 2011-11-23 MED ORDER — MONTELUKAST SODIUM 10 MG PO TABS: 10.0000 mg | ORAL_TABLET | Freq: Every day | ORAL | Status: DC | PRN

## 2011-11-23 MED ORDER — ALBUTEROL SULFATE HFA 108 (90 BASE) MCG/ACT IN AERS: 2.0000 | INHALATION_SPRAY | Freq: Four times a day (QID) | RESPIRATORY_TRACT | Status: DC | PRN

## 2011-11-23 MED ORDER — SODIUM CHLORIDE 0.9 % IJ SOLN
3.0000 mL | Freq: Two times a day (BID) | INTRAMUSCULAR | Status: DC
  Administered 2011-11-23 (×2): 3 mL via INTRAVENOUS

## 2011-11-23 MED ORDER — ASPIRIN EC 81 MG PO TBEC
81.0000 mg | DELAYED_RELEASE_TABLET | Freq: Every day | ORAL | Status: DC
  Administered 2011-11-23 – 2011-11-24 (×2): 81 mg via ORAL
  Filled 2011-11-23 (×2): qty 1

## 2011-11-23 MED ORDER — SULFAMETHOXAZOLE-TMP DS 800-160 MG PO TABS
1.0000 | ORAL_TABLET | Freq: Two times a day (BID) | ORAL | Status: DC
  Administered 2011-11-23 – 2011-11-24 (×3): 1 via ORAL
  Filled 2011-11-23 (×4): qty 1

## 2011-11-23 MED ORDER — IOHEXOL 350 MG/ML SOLN
100.0000 mL | Freq: Once | INTRAVENOUS | Status: AC | PRN
  Administered 2011-11-23: 100 mL via INTRAVENOUS

## 2011-11-23 MED ORDER — ACETAMINOPHEN 650 MG RE SUPP: 650.0000 mg | Freq: Four times a day (QID) | RECTAL | Status: DC | PRN

## 2011-11-23 MED ORDER — SODIUM CHLORIDE 0.9 % IJ SOLN: 3.0000 mL | INTRAMUSCULAR | Status: DC | PRN

## 2011-11-23 MED ORDER — ACETAMINOPHEN 325 MG PO TABS
650.0000 mg | ORAL_TABLET | Freq: Four times a day (QID) | ORAL | Status: DC | PRN
  Administered 2011-11-23: 650 mg via ORAL
  Filled 2011-11-23: qty 2

## 2011-11-23 MED ORDER — SODIUM CHLORIDE 0.9 % IV SOLN: 250.0000 mL | INTRAVENOUS | Status: DC | PRN

## 2011-11-23 MED ORDER — ONDANSETRON HCL 4 MG PO TABS: 4.0000 mg | ORAL_TABLET | Freq: Four times a day (QID) | ORAL | Status: DC | PRN

## 2011-11-23 MED ORDER — SIMVASTATIN 20 MG PO TABS
20.0000 mg | ORAL_TABLET | Freq: Every evening | ORAL | Status: DC
  Administered 2011-11-23: 20 mg via ORAL
  Filled 2011-11-23 (×2): qty 1

## 2011-11-23 MED ORDER — CITALOPRAM HYDROBROMIDE 20 MG PO TABS
20.0000 mg | ORAL_TABLET | Freq: Every day | ORAL | Status: DC
  Administered 2011-11-23 – 2011-11-24 (×2): 20 mg via ORAL
  Filled 2011-11-23 (×2): qty 1

## 2011-11-23 MED ORDER — SODIUM CHLORIDE 0.9 % IJ SOLN
3.0000 mL | Freq: Two times a day (BID) | INTRAMUSCULAR | Status: DC
  Administered 2011-11-23 – 2011-11-24 (×2): 3 mL via INTRAVENOUS

## 2011-11-23 NOTE — ED Notes (Signed)
Patient returned from CT

## 2011-11-23 NOTE — H&P (Signed)
PCP:    Emeterio Reeve, MD  Cardiology: [facility]  Chief Complaint:   Chest pain and feeling bad.   HPI: Sarah [name] is a 78 y.o. female   has a past medical history of Bronchiectasis; Pollen allergies; Asthma; Hypertension; Allergic rhinitis; NSTEMI (non-ST elevated myocardial infarction); Diastolic dysfunction; Idiopathic peripheral neuropathy; Hypothyroidism; Depression; Shortness of breath; Pneumonia; H/O hiatal hernia; Arthritis; and Osteopenia (02/2011).   Presented with  She was not feeling well for few days. She have had poor sleep. And while was sitting down developed sense of doom like she was going to dye. She checked her blood pressure and it was 190's which is very high for her. She became very upset. She felt some shortness of breath like she could not get enough air. Experienced some palpitations and chest discomfort.  Patient states that her blood pressure has been low for the past one month and she was taken off of all her  blood pressure medications. Up until today when her blood pressure was noted to be elevated to that really scared her. She denies any additional stressors.  Review of Systems:    Pertinent positives include: shortness of breath at rest.   dyspnea on exertion,  nausea, chest pain,  Constitutional:  No weight loss, night sweats, Fevers, chills, fatigue, weight loss  HEENT:  No headaches, Difficulty swallowing,Tooth/dental problems,Sore throat,  No sneezing, itching, ear ache, nasal congestion, post nasal drip,  Cardio-vascular:  No Orthopnea, PND, anasarca, dizziness, palpitations.no Bilateral lower extremity swelling  GI:  No heartburn, indigestion, abdominal pain, vomiting, diarrhea, change in bowel habits, loss of appetite, melena, blood in stool, hematemesis Resp:  no No excess mucus, no productive cough, No non-productive cough, No coughing up of blood.No change in color of mucus.No wheezing. Skin:  no rash or lesions. No jaundice GU:  no  dysuria, change in color of urine, no urgency or frequency. No straining to urinate.  No flank pain.  Musculoskeletal:  No joint pain or no joint swelling. No decreased range of motion. No back pain.  Psych:  No change in mood or affect. No depression or anxiety. No memory loss.  Neuro: no localizing neurological complaints, no tingling, no weakness, no double vision, no gait abnormality, no slurred speech, no confusion  Otherwise ROS are negative except for above, 10 systems were reviewed  Past Medical History: Past Medical History  Diagnosis Date  . Bronchiectasis     with history of Legionaires with chronic rales/rhonchi  . Pollen allergies   . Asthma   . Hypertension     Negative renal duplex in December of 2012  . Allergic rhinitis   . NSTEMI (non-ST elevated myocardial infarction)     a. Normal coronaries per cath August 2012 and negative CT angio for PE;  b. 06/2011 Repeat admission w/ chest pain and elevated troponin's, CTA Chest w/o PE  . Diastolic dysfunction     a. per echo 08/2010 with normal LV function;  b. 06/2011 Echo: EF 65-70%  . Idiopathic peripheral neuropathy   . Hypothyroidism   . Depression   . Shortness of breath   . Pneumonia     hx of pna  . H/O hiatal hernia   . Arthritis   . Osteopenia 02/2011    t score - 2.1   Past Surgical History  Procedure Date  . Dilation and curettage of uterus   . Total abdominal hysterectomy w/ bilateral salpingoophorectomy 1972    leiomyomata, menorrhagia  . Appendectomy   .  Cardiac catheterization August 2012    Normal coronaries.  . Tonsillectomy      Medications: Prior to Admission medications   Medication Sig Start Date End Date Taking? Authorizing Provider  acetaminophen-codeine (TYLENOL #3) 300-30 MG per tablet Take 1 tablet by mouth every 4 (four) hours as needed. For pain 12/01/10  Yes Historical Provider, MD  albuterol (PROVENTIL HFA;VENTOLIN HFA) 108 (90 BASE) MCG/ACT inhaler Inhale 2 puffs into the lungs  every 6 (six) hours as needed. For shortness of breath   Yes Historical Provider, MD  aspirin EC 81 MG tablet Take 81 mg by mouth daily.   Yes Historical Provider, MD  budesonide-formoterol (SYMBICORT) 160-4.5 MCG/ACT inhaler Inhale 2 puffs into the lungs 2 (two) times daily. 08/30/11 08/29/12 Yes Leslye Peer, MD  citalopram (CELEXA) 40 MG tablet Take 20 mg by mouth daily.    Yes Historical Provider, MD  estradiol (VIVELLE-DOT) 0.05 MG/24HR Place 1 patch (0.05 mg total) onto the skin once a week. Apply on Thursday 08/27/11 08/26/12 Yes Dara Lords, MD  fluticasone (FLONASE) 50 MCG/ACT nasal spray Place 2 sprays into the nose daily as needed. For congestion   Yes Historical Provider, MD  isosorbide mononitrate (IMDUR) 30 MG 24 hr tablet Take 1 tablet (30 mg total) by mouth daily. 07/22/11 07/21/12 Yes Rosalio Macadamia, NP  levothyroxine (SYNTHROID, LEVOTHROID) 50 MCG tablet Take 50 mcg by mouth daily.     Yes Historical Provider, MD  metoprolol tartrate (LOPRESSOR) 25 MG tablet Take 12.5 mg by mouth 2 (two) times daily as needed.   Yes Historical Provider, MD  montelukast (SINGULAIR) 10 MG tablet Take 10 mg by mouth daily as needed. For allergies   Yes Historical Provider, MD  nitroGLYCERIN (NITROSTAT) 0.4 MG SL tablet Place 0.4 mg under the tongue every 5 (five) minutes as needed. For chest pain 06/20/11 06/19/12 Yes Ok Anis, NP  pregabalin (LYRICA) 50 MG capsule Take 50 mg by mouth 2 (two) times daily.    Yes Historical Provider, MD  simvastatin (ZOCOR) 20 MG tablet Take 1 tablet (20 mg total) by mouth every evening. 07/24/11 07/23/12 Yes Vesta Mixer, MD    Allergies:   Allergies  Allergen Reactions  . Hydrocodone Nausea And Vomiting and Other (See Comments)    Passed out, threw up, blood pressure dropped  . Levofloxacin Other (See Comments)    Pass out  . Oxycodone Other (See Comments)    Rapid heart beat  . Clarithromycin Other (See Comments)    unknown    Social  History:  Ambulatory   Independently  Lives at  home   reports that she has never smoked. She has never used smokeless tobacco. She reports that she drinks alcohol. She reports that she does not use illicit drugs.   Family History: family history includes Cancer in her brother and Hypertension in her mother.    Physical Exam: Patient Vitals for the past 24 hrs:  BP Temp Temp src Pulse Resp SpO2  11/23/11 0230 143/62 mmHg - - 67  14  96 %  11/23/11 0215 163/83 mmHg - - 74  16  94 %  11/23/11 0200 169/77 mmHg - - 71  16  96 %  11/23/11 0145 156/71 mmHg - - 74  14  95 %  11/23/11 0130 168/73 mmHg - - 71  19  96 %  11/23/11 0115 164/79 mmHg - - 77  14  95 %  11/23/11 0045 146/72 mmHg - - 70  21  94 %  11/23/11 0015 139/66 mmHg - - 67  19  97 %  11/23/11 0007 162/72 mmHg - - 77  18  94 %  11/23/11 0000 168/77 mmHg - - 76  14  94 %  11/22/11 2345 159/75 mmHg - - 79  16  94 %  11/22/11 2330 160/82 mmHg - - 76  20  96 %  11/22/11 2315 179/78 mmHg - - 75  15  95 %  11/22/11 2215 139/63 mmHg - - 77  19  95 %  11/22/11 2200 150/74 mmHg - - 77  21  96 %  11/22/11 2151 - 97.9 F (36.6 C) - - - -  11/22/11 2145 161/75 mmHg - - 80  15  97 %  11/22/11 2115 159/78 mmHg - - 74  18  96 %  11/22/11 2100 143/73 mmHg - - 74  18  97 %  11/22/11 2045 152/72 mmHg - - 80  18  98 %  11/22/11 2000 154/71 mmHg - - 79  15  96 %  11/22/11 1900 135/66 mmHg - - 78  19  97 %  11/22/11 1830 142/59 mmHg - - 75  16  96 %  11/22/11 1749 153/72 mmHg 97.9 F (36.6 C) Oral - 18  97 %    1. General:  in No Acute distress 2. Psychological: Alert and   Oriented 3. Head/ENT:   Moist   Mucous Membranes                          Head Non traumatic, neck supple                          Normal  Dentition 4. SKIN:   decreased Skin turgor,  Skin clean Dry and intact no rash 5. Heart: Regular rate and rhythm no Murmur, Rub or gallop 6. Lungs:no wheezes occasional crackles   7. Abdomen: Soft, non-tender, Non  distended 8. Lower extremities: no clubbing, cyanosis, or edema 9. Neurologically cranial nerves II through XII intact strength 5 out of 5 in all 4 extremities  10. MSK: Normal range of motion  body mass index is unknown because there is no height or weight on file.   Labs on Admission:   [facility] 11/22/11 2100  NA 139  K 4.0  CL 103  CO2 24  GLUCOSE 92  BUN 15  CREATININE 0.94  CALCIUM 8.7  MG --  PHOS --   No results found for this basename: AST:2,ALT:2,ALKPHOS:2,BILITOT:2,PROT:2,ALBUMIN:2 in the last 72 hours No results found for this basename: LIPASE:2,AMYLASE:2 in the last 72 hours  Basename 11/22/11 2100  WBC 10.4  NEUTROABS --  HGB 11.5*  HCT 35.5*  MCV 87.7  PLT 238   No results found for this basename: CKTOTAL:3,CKMB:3,CKMBINDEX:3,TROPONINI:3 in the last 72 hours No results found for this basename: TSH,T4TOTAL,FREET3,T3FREE,THYROIDAB in the last 72 hours No results found for this basename: VITAMINB12:2,FOLATE:2,FERRITIN:2,TIBC:2,IRON:2,RETICCTPCT:2 in the last 72 hours Lab Results  Component Value Date   HGBA1C 6.1* 09/06/2010    The CrCl is unknown because both a height and weight (above a minimum accepted value) are required for this calculation. ABG No results found for this basename: phart, pco2, po2, hco3, tco2, acidbasedef, o2sat     Lab Results  Component Value Date   DDIMER 0.93* 09/05/2010     Other results:  I have pearsonaly reviewed this: ECG REPORT  Rate: 82  Rhythm: NSR ST&T Change: no ischemic changes  UA no evidence of infection    Cultures:    Component Value Date/Time   SDES URINE, RANDOM 06/19/2011 1057   SPECREQUEST NONE 06/19/2011 1057   CULT NO GROWTH 06/19/2011 0018   REPTSTATUS 06/20/2011 FINAL 06/19/2011 1057       Radiological Exams on Admission: Dg Chest Port 1 View  11/22/2011  *RADIOLOGY REPORT*  Clinical Data: Near-syncope  PORTABLE CHEST - 1 VIEW  Comparison: 10/10/2011  Findings: Cardiomediastinal silhouette is  stable.  No acute infiltrate or pleural effusion.  No pulmonary edema.  Hiatal hernia again noted.  IMPRESSION: No active disease.  No significant change.   Original Report Authenticated By: Natasha Mead, M.D.     Chart has been reviewed  Assessment/Plan  78 yo W sudden onset of shortness of breath and chest pain and severe hypertention  Present on Admission:  . Chest pain, midsternal - this somewhat atypical the fact the patient had a negative catheterization done a year ago is a encouraging. We'll cycle cardiac markers monitor on telemetry repeat serial EKGs obtain echogram to evaluate for wall more motion abnormality or evidence of heart failure. Given elevated d-dimer and sudden onset of her symptoms would obtain CT scan of the chest to rule out PE  . HYPERTENSION - currently blood pressure stabilized we'll restart home medications. The patient may have labile hypertension and should be monitored carefully.  . Shortness of breath dyspnea - likely multifactorial chest x-ray did not show any evidence of pneumonia or heart failure will await results of CT.  Marland Kitchen Anxiety - will give her on Xanax for now, if no evidence of cardiopulmonary disease noted consider anxiety as a source of her symptoms.   Prophylaxis:   Lovenox, Protonix  CODE STATUS: Full code as per patient's wishes.  Other plan as per orders.  I have spent a total of 55 min on this admission  Sarah [name] 11/23/2011, 3:02 AM

## 2011-11-23 NOTE — Progress Notes (Signed)
[facility] PROGRESS NOTE  Alfredia Desanctis RUE:[id number] DOB: May 22, 1921 DOA: 11/22/2011 PCP: Emeterio Reeve, MD  Assessment/Plan: Principal Problem:  *Chest pain, midsternal Active Problems:  HYPERTENSION  Shortness of breath dyspnea  Anxiety  Malignant hypertension    1. Chest pain, mid-sternal: Patient presented with atypical-sounding chest pain, associated with palpitations and a feeling of doom. CXR is devoid of acute pathology, 12-lead EKG shows no acute ischemic changes, cardiac enzymes are unelevated so far, and telemetric monitoring has revealed no arrhythmias. Patient is s/p cardiac catheterization in 08/2010, which showed normal coronaries. She has been reassured accordingly. We shall continue low dose ASA and add PPI.  2. Dyspnea: Patient did have transient shortness of breath, pre-admission. She does have a known history of bronchiectasis and asthma. Although D-Dimer was elevated at 0.93, CTA failed to demonstrate pulmonary embolism. 2D echocrdiogram is pending, although previous echo on 06/19/11, showed EF of 65%-70%.  3. Asthma/Bronchiectasis: Patient has a known history of bronchiectasis, and is under the care of Dr Levy Pupa, pulmonologist. She has no productive cough at this time, or objective dyspnea. CTA showed a mild mosaic pattern of parenchymal attenuation within both lungs, which may reflect an infectious or inflammatory process, as well as a nodularity within the right middle and lower lobes which may reflect an infectious or inflammatory process. Will place on a 7-day course of Bactrim DS, given documented allergies to quinolones and macrolides.  4. HTN: Patient's BP was significantly elevated at presentation. According to her, she was taken off her antihypertensives, for low BP, fairly recently. Imdur and Lopressor have been reinstated and BP is reasonable today. Further adjustment may be needed.  5. Anxiety: Patient is clearly very anxious, and somewhat  emotionally labile. Will manage with scheduled Xanax, as this may be contributory to her presenting symptoms.  6. Hypothyroidism: Continued on thyroxine replacement therapy.      Code Status: Full Code.  Family Communication:  Disposition Plan: To be determined.    Brief narrative: 78 y.o. female with history of Bronchiectasis; Pollen allergies; Asthma; Hypertension; Allergic rhinitis; NSTEMI (non-ST elevated myocardial infarction); Diastolic dysfunction; Idiopathic peripheral neuropathy; Hypothyroidism; Depression; hiatal hernia; Arthritis; and Osteopenia (02/2011), presenting with feeling unwell for few days and insomnia. On 11/22/11, while was sitting down. She developed sense of doom like she was going to die. She checked her blood pressure and it was 190's which is very high for her. She became very upset, felt shortness of breath, experienced palpitations and chest discomfort. Patient states that her blood pressure has been low for the past one month and she was taken off of all her blood pressure medications. She was admitted for further evaluation and management.    Consultants:  N/A.   Procedures:  CXR.  CTA.   Antibiotics:  N/A  HPI/Subjective: No new issues.  Objective: Vital signs in last 24 hours: Temp:  [97.5 F (36.4 C)-97.9 F (36.6 C)] 97.5 F (36.4 C) (11/09 0457) Pulse Rate:  [67-80] 76  (11/09 0457) Resp:  [14-21] 16  (11/09 0457) BP: (135-179)/(59-83) 143/78 mmHg (11/09 0534) SpO2:  [94 %-98 %] 97 % (11/09 0457) Weight:  [60.1 kg (132 lb 7.9 oz)] 60.1 kg (132 lb 7.9 oz) (11/09 0457) Weight change:  Last BM Date: 11/22/11  Intake/Output from previous day: 11/08 0701 - 11/09 0700 In: -  Out: 250 [Urine:250]     Physical Exam: General: Comfortable, alert, communicative, fully oriented, not short of breath at rest, seems very anxious, denies chest pain.  HEENT:  No clinical pallor, no jaundice, no conjunctival injection or discharge. Hydration  status is fair.  NECK:  Supple, JVP not seen, no carotid bruits, no palpable lymphadenopathy, no palpable goiter. CHEST:  Clinically clear to auscultation, no wheezes, no crackles. HEART:  Sounds 1 and 2 heard, normal, regular, no murmurs. ABDOMEN:  Full, soft, non-tender, no palpable organomegaly, no palpable masses, normal bowel sounds. GENITALIA:  Not examined. LOWER EXTREMITIES:  No pitting edema, palpable peripheral pulses. MUSCULOSKELETAL SYSTEM:  Generalized osteoarthritic changes, otherwise, normal. CENTRAL NERVOUS SYSTEM:  No focal neurologic deficit on gross examination.  Lab Results:  [facility] 11/22/11 2100  WBC 10.4  HGB 11.5*  HCT 35.5*  PLT 238    Basename 11/22/11 2100  NA 139  K 4.0  CL 103  CO2 24  GLUCOSE 92  BUN 15  CREATININE 0.94  CALCIUM 8.7   No results found for this or any previous visit (from the past 240 hour(s)).   Studies/Results: Ct Angio Chest Pe W/cm &/or Wo Cm  11/23/2011  *RADIOLOGY REPORT*  Clinical Data: Chest pain; fatigue.  Hypertension.  CT ANGIOGRAPHY CHEST  Technique:  Multidetector CT imaging of the chest using the standard protocol during bolus administration of intravenous contrast. Multiplanar reconstructed images including MIPs were obtained and reviewed to evaluate the vascular anatomy.  Contrast: OMNIPAQUE IOHEXOL 350 MG/ML SOLN  Comparison: Chest radiograph performed 11/22/2011, and CTA of the chest performed 06/19/2011  Findings: There is no evidence of pulmonary embolus.  A mild mosaic pattern of parenchymal attenuation is noted within both lungs, nonspecific in appearance.  There is focal bronchiectasis and scarring at the left lingula.  Additional mild bronchiectasis is noted at the medial left lung base.  Mild nodularity within the right middle and lower lobes along the right major fissure may reflect an infectious or inflammatory process.  There is no evidence of significant focal consolidation, pleural effusion or  pneumothorax.  No abnormal focal contrast enhancement is seen.  The mediastinum is grossly unremarkable in appearance.  No mediastinal lymphadenopathy is seen.  No pericardial effusion is identified.  The great vessels are grossly unremarkable in appearance.  No axillary lymphadenopathy is seen.  The visualized portions of the thyroid gland are unremarkable in appearance.  The visualized portions of the liver and spleen are unremarkable. The visualized portions of the pancreas, gallbladder, adrenal glands and kidneys are within normal limits.  A moderate hiatal hernia is seen.  No acute osseous abnormalities are seen.  IMPRESSION:  1.  No evidence of pulmonary embolus. 2.  Mild mosaic pattern of parenchymal attenuation within both lungs; this may reflect an infectious or inflammatory process. 3.  Focal bronchiectasis and scarring at the left lingula, and mild bronchiectasis at the medial left lung base. 4.  Mild nodularity within the right middle and lower lobes may reflect an infectious or inflammatory process. 5.  Moderate hiatal hernia seen.   Original Report Authenticated By: Tonia Ghent, M.D.    Dg Chest Port 1 View  11/22/2011  *RADIOLOGY REPORT*  Clinical Data: Near-syncope  PORTABLE CHEST - 1 VIEW  Comparison: 10/10/2011  Findings: Cardiomediastinal silhouette is stable.  No acute infiltrate or pleural effusion.  No pulmonary edema.  Hiatal hernia again noted.  IMPRESSION: No active disease.  No significant change.   Original Report Authenticated By: Natasha Mead, M.D.     Medications: Scheduled Meds:   . aspirin EC  81 mg Oral Daily  . [COMPLETED] aspirin  325 mg Oral STAT  .  budesonide-formoterol  2 puff Inhalation BID  . citalopram  20 mg Oral Daily  . docusate sodium  100 mg Oral BID  . enoxaparin (LOVENOX) injection  40 mg Subcutaneous Q24H  . isosorbide mononitrate  30 mg Oral Daily  . levothyroxine  50 mcg Oral Q0600  . metoprolol tartrate  12.5 mg Oral BID  . pregabalin  50 mg Oral  BID  . simvastatin  20 mg Oral QPM  . sodium chloride  3 mL Intravenous Q12H  . sodium chloride  3 mL Intravenous Q12H   Continuous Infusions:  PRN Meds:.sodium chloride, acetaminophen, acetaminophen, acetaminophen-codeine, albuterol, ALPRAZolam, hydrALAZINE, [COMPLETED] iohexol, montelukast, nitroGLYCERIN, ondansetron (ZOFRAN) IV, ondansetron, sodium chloride, zolpidem, [DISCONTINUED] nitroGLYCERIN    LOS: 1 day   Amarius Toto,CHRISTOPHER  [facility] Pager [phone]. If 8PM-8AM, please contact night-coverage at [email or web address], password [facility] 11/23/2011, 7:31 AM  LOS: 1 day

## 2011-11-23 NOTE — ED Notes (Signed)
Admitting MD at bedside. Report called to Floor 2000.

## 2011-11-23 NOTE — Progress Notes (Signed)
INITIAL ADULT NUTRITION ASSESSMENT Date: 11/23/2011   Time: 9:40 AM  INTERVENTION:  Ensure Pudding supplement daily (170 kcals, 4 gm protein per 4 oz cup) RD to follow for nutrition care plan  DOCUMENTATION CODES Per approved criteria  -Not Applicable   Reason for Assessment: Malnutrition Screening Tool Report  ASSESSMENT: Female 78 y.o.  Dx: Chest pain, midsternal  Hx:  Past Medical History  Diagnosis Date  . Bronchiectasis     with history of Legionaires with chronic rales/rhonchi  . Pollen allergies   . Asthma   . Hypertension     Negative renal duplex in December of 2012  . Allergic rhinitis   . NSTEMI (non-ST elevated myocardial infarction)     a. Normal coronaries per cath August 2012 and negative CT angio for PE;  b. 06/2011 Repeat admission w/ chest pain and elevated troponin's, CTA Chest w/o PE  . Diastolic dysfunction     a. per echo 08/2010 with normal LV function;  b. 06/2011 Echo: EF 65-70%  . Idiopathic peripheral neuropathy   . Hypothyroidism   . Depression   . Shortness of breath   . Pneumonia     hx of pna  . H/O hiatal hernia   . Arthritis   . Osteopenia 02/2011    t score - 2.1    Related Meds:     . aspirin EC  81 mg Oral Daily  . [COMPLETED] aspirin  325 mg Oral STAT  . budesonide-formoterol  2 puff Inhalation BID  . citalopram  20 mg Oral Daily  . docusate sodium  100 mg Oral BID  . enoxaparin (LOVENOX) injection  40 mg Subcutaneous Q24H  . isosorbide mononitrate  30 mg Oral Daily  . levothyroxine  50 mcg Oral Q0600  . metoprolol tartrate  12.5 mg Oral BID  . pregabalin  50 mg Oral BID  . simvastatin  20 mg Oral QPM  . sodium chloride  3 mL Intravenous Q12H  . sodium chloride  3 mL Intravenous Q12H    Ht: 5' 7.5" (171.5 cm)  Wt: 132 lb 7.9 oz (60.1 kg)  Ideal Wt: 61.3 kg % Ideal Wt: 98%  Usual Wt: 138 lb -- October 2013 % Usual Wt: 95%  Body mass index is 20.45 kg/(m^2).  Food/Nutrition Related Hx: recent weight lost  without trying and decreased appetite per admission nutrition screen  Labs:  CMP     Component Value Date/Time   NA 139 11/22/2011 2100   K 4.0 11/22/2011 2100   CL 103 11/22/2011 2100   CO2 24 11/22/2011 2100   GLUCOSE 92 11/22/2011 2100   BUN 15 11/22/2011 2100   CREATININE 0.94 11/22/2011 2100   CALCIUM 8.7 11/22/2011 2100   PROT 6.8 10/29/2011 1236   ALBUMIN 3.5 10/29/2011 1236   AST 24 10/29/2011 1236   ALT 13 10/29/2011 1236   ALKPHOS 74 10/29/2011 1236   BILITOT 0.4 10/29/2011 1236   GFRNONAA 53* 11/22/2011 2100   GFRAA 61* 11/22/2011 2100     Intake/Output Summary (Last 24 hours) at 11/23/11 0941 Last data filed at 11/23/11 0400  Gross per 24 hour  Intake      0 ml  Output    250 ml  Net   -250 ml    Diet Order: Cardiac  Supplements/Tube Feeding: N/A  IVF: N/A   Estimated Nutritional Needs:   Kcal: 1400-1600 Protein: 60-70 gm Fluid: > 1.5 L  Presented with not feeling well for few days; started experiencing  shortness of breath, palpitations and chest discomfort; patient reports she's had a poor appetite x 1 week; endorses a 3-4 lb weight loss more than likely due to fluid; she doesn't care for Ensure supplements, however, amenable to Ensure Pudding -- RD to order.  NUTRITION DIAGNOSIS: -Inadequate oral intake (NI-2.1).  Status: Ongoing  RELATED TO: poor appetite  AS EVIDENCE BY: patient report  MONITORING/EVALUATION(Goals): Goal: Oral intake with meals & supplements to meet >/= 90% of estimated nutrition needs Monitor: PO & supplemental intake, weight, labs, I/O's  EDUCATION NEEDS: -No education needs identified at this time  Kirkland Hun, RD, LDN Pager #: [phone] After-Hours Pager #: [phone]

## 2011-11-23 NOTE — ED Provider Notes (Signed)
Medical screening examination/treatment/procedure(s) were conducted as a shared visit with non-physician practitioner(s) and myself.  I personally evaluated the patient during the encounter  Doug Sou, MD 11/23/11 [phone]

## 2011-11-23 NOTE — Progress Notes (Signed)
UR completed. Irene Collings RNBSN 

## 2011-11-24 DIAGNOSIS — F411 Generalized anxiety disorder: Secondary | ICD-10-CM | POA: Diagnosis not present

## 2011-11-24 DIAGNOSIS — J479 Bronchiectasis, uncomplicated: Secondary | ICD-10-CM | POA: Diagnosis not present

## 2011-11-24 DIAGNOSIS — R079 Chest pain, unspecified: Secondary | ICD-10-CM | POA: Diagnosis not present

## 2011-11-24 DIAGNOSIS — I10 Essential (primary) hypertension: Secondary | ICD-10-CM | POA: Diagnosis not present

## 2011-11-24 LAB — BASIC METABOLIC PANEL
CO2: 26 mEq/L (ref 19–32)
Calcium: 8.8 mg/dL (ref 8.4–10.5)
Creatinine, Ser: 1.04 mg/dL (ref 0.50–1.10)
Glucose, Bld: 81 mg/dL (ref 70–99)
Sodium: 140 mEq/L (ref 135–145)

## 2011-11-24 LAB — CBC
Hemoglobin: 11.5 g/dL — ABNORMAL LOW (ref 12.0–15.0)
MCH: 27.4 pg (ref 26.0–34.0)
MCV: 86.9 fL (ref 78.0–100.0)
RBC: 4.2 MIL/uL (ref 3.87–5.11)

## 2011-11-24 MED ORDER — ALPRAZOLAM 0.25 MG PO TABS: 0.2500 mg | ORAL_TABLET | Freq: Three times a day (TID) | ORAL | Status: DC

## 2011-11-24 MED ORDER — ENSURE PUDDING PO PUDG: 1.0000 | Freq: Three times a day (TID) | ORAL | Status: DC

## 2011-11-24 MED ORDER — SULFAMETHOXAZOLE-TMP DS 800-160 MG PO TABS: 1.0000 | ORAL_TABLET | Freq: Two times a day (BID) | ORAL | Status: AC

## 2011-11-24 NOTE — Progress Notes (Signed)
Pt discharged per MD order and protocol. All discharge instructions reviewed and all questions answered. Pt aware of follow up appointments and all Rx'es given.

## 2011-11-24 NOTE — Discharge Summary (Addendum)
Physician Discharge Summary  Winni Ehrhard ZOX:[id number] DOB: 1921-09-17 DOA: 11/22/2011  PCP: Emeterio Reeve, MD  Admit date: 11/22/2011 Discharge date: 11/24/2011  Time spent: 40 minutes  Recommendations for Outpatient Follow-up:  1. Follow up with primary MD.   Discharge Diagnoses:  Principal Problem:  *Chest pain, midsternal Active Problems:  HYPERTENSION  Shortness of breath dyspnea  Anxiety  Malignant hypertension   Discharge Condition: Satisfactory.   Diet recommendation: Heart-Healthy.   Filed Weights   11/23/11 0457  Weight: 60.1 kg (132 lb 7.9 oz)    History of present illness:  78 y.o. female with history of Bronchiectasis; Pollen allergies; Asthma; Hypertension; Allergic rhinitis; NSTEMI (non-ST elevated myocardial infarction); Diastolic dysfunction; Idiopathic peripheral neuropathy; Hypothyroidism; Depression; hiatal hernia; Arthritis; and Osteopenia (02/2011), presenting with feeling unwell for few days and insomnia. On 11/22/11, while was sitting down. She developed sense of doom like she was going to die. She checked her blood pressure and it was 190's which is very high for her. She became very upset, felt shortness of breath, experienced palpitations and chest discomfort. Patient states that her blood pressure has been low for the past one month and she was taken off of all her blood pressure medications. She was admitted for further evaluation and management.    Hospital Course:  1. Chest pain, mid-sternal: Patient presented with atypical-sounding chest pain, associated with palpitations and a feeling of doom. CXR was devoid of acute pathology, 12-lead EKG shows no acute ischemic changes, cardiac enzymes remained unelevated and telemetric monitoring revealed no arrhythmias. Patient is s/p cardiac catheterization in 08/2010, which showed normal coronaries. She has been reassured accordingly. Continued on low dose ASA and PPI.  2. Dyspnea: Patient did have  transient shortness of breath, pre-admission. She does have a known history of bronchiectasis and asthma. Although D-Dimer was elevated at 0.93, CTA failed to demonstrate pulmonary embolism. Previous 2D echocardiogram on 06/19/11, showed EF of 65%-70%.  3. Asthma/Bronchiectasis: Patient has a known history of bronchiectasis, and is under the care of Dr Levy Pupa, pulmonologist. She has no productive cough at this time, or objective dyspnea. CTA showed a mild mosaic pattern of parenchymal attenuation within both lungs, which may reflect an infectious or inflammatory process, as well as a nodularity within the right middle and lower lobes which may reflect an infectious or inflammatory process. She has been placed on a 7-day course of Bactrim DS, given documented allergies to quinolones and macrolides, to be concluded on 11/30/11.  4. HTN: Patient's BP was significantly elevated at presentation. According to her, she was taken off her antihypertensives, for low BP, fairly recently. Imdur and Lopressor have been reinstated and BP remained controlled, during hospitalization.  5. Anxiety: Patient is clearly very anxious, and somewhat emotionally labile. As this may be contributory to her presenting symptoms, she has been placed on scheduled Xanax.  6. Hypothyroidism: Continued on thyroxine replacement therapy. TSH was 3.321.   Procedures:  See below.   Consultations:  N/A.   Discharge Exam: Filed Vitals:   11/23/11 2028 11/23/11 2055 11/24/11 0424 11/24/11 1004  BP: 102/55  123/64 126/62  Pulse: 70  66 62  Temp: 98.8 F (37.1 C)  98.5 F (36.9 C)   TempSrc: Oral  Oral   Resp: 17  18   Height:      Weight:      SpO2: 93% 93% 93%     General: Comfortable, alert, communicative, fully oriented, not short of breath at rest.  HEENT: No clinical pallor,  no jaundice, no conjunctival injection or discharge. Hydration status is fair.  NECK: Supple, JVP not seen, no carotid bruits, no palpable  lymphadenopathy, no palpable goiter.  CHEST: Clinically clear to auscultation, no wheezes, no crackles.  HEART: Sounds 1 and 2 heard, normal, regular, no murmurs.  ABDOMEN: Full, soft, non-tender, no palpable organomegaly, no palpable masses, normal bowel sounds.  GENITALIA: Not examined.  LOWER EXTREMITIES: No pitting edema, palpable peripheral pulses.  MUSCULOSKELETAL SYSTEM: Generalized osteoarthritic changes, otherwise, normal.  CENTRAL NERVOUS SYSTEM: No focal neurologic deficit on gross examination.  Discharge Instructions      Discharge Orders    Future Appointments: Provider: Department: Dept Phone: Center:   12/16/2011 11:00 AM Vesta Mixer, MD [facility] Main Office [place]) [phone] [id number]   02/24/2012 10:30 AM [address] [facility] AT [address] [phone] [address]     Future Orders Please Complete By Expires   Diet - low sodium heart healthy      Increase activity slowly          Medication List     As of 11/24/2011 10:13 AM    TAKE these medications         acetaminophen-codeine 300-30 MG per tablet   Commonly known as: TYLENOL #3   Take 1 tablet by mouth every 4 (four) hours as needed. For pain      albuterol 108 (90 BASE) MCG/ACT inhaler   Commonly known as: PROVENTIL HFA;VENTOLIN HFA   Inhale 2 puffs into the lungs every 6 (six) hours as needed. For shortness of breath      ALPRAZolam 0.25 MG tablet   Commonly known as: XANAX   Take 1 tablet (0.25 mg total) by mouth 3 (three) times daily.      aspirin EC 81 MG tablet   Take 81 mg by mouth daily.      budesonide-formoterol 160-4.5 MCG/ACT inhaler   Commonly known as: SYMBICORT   Inhale 2 puffs into the lungs 2 (two) times daily.      citalopram 40 MG tablet   Commonly known as: CELEXA   Take 20 mg by mouth daily.      estradiol 0.05 MG/24HR   Commonly known as: VIVELLE-DOT   Place 1 patch (0.05 mg total) onto the skin once a week. Apply on Thursday        feeding supplement Pudg   Take 1 Container by mouth 3 (three) times daily between meals.      fluticasone 50 MCG/ACT nasal spray   Commonly known as: FLONASE   Place 2 sprays into the nose daily as needed. For congestion      isosorbide mononitrate 30 MG 24 hr tablet   Commonly known as: IMDUR   Take 1 tablet (30 mg total) by mouth daily.      levothyroxine 50 MCG tablet   Commonly known as: SYNTHROID, LEVOTHROID   Take 50 mcg by mouth daily.      metoprolol tartrate 25 MG tablet   Commonly known as: LOPRESSOR   Take 12.5 mg by mouth 2 (two) times daily as needed.      montelukast 10 MG tablet   Commonly known as: SINGULAIR   Take 10 mg by mouth daily as needed. For allergies      nitroGLYCERIN 0.4 MG SL tablet   Commonly known as: NITROSTAT   Place 0.4 mg under the tongue every 5 (five) minutes as needed. For chest pain  pregabalin 50 MG capsule   Commonly known as: LYRICA   Take 50 mg by mouth 2 (two) times daily.      simvastatin 20 MG tablet   Commonly known as: ZOCOR   Take 1 tablet (20 mg total) by mouth every evening.      sulfamethoxazole-trimethoprim 800-160 MG per tablet   Commonly known as: BACTRIM DS   Take 1 tablet by mouth every 12 (twelve) hours.         Follow-up Information    Schedule an appointment as soon as possible for a visit with Emeterio Reeve, MD.   Contact information:   [address] [place] [address] [phone]           The results of significant diagnostics from this hospitalization (including imaging, microbiology, ancillary and laboratory) are listed below for reference.    Significant Diagnostic Studies: Ct Angio Chest Pe W/cm &/or Wo Cm  11/23/2011  *RADIOLOGY REPORT*  Clinical Data: Chest pain; fatigue.  Hypertension.  CT ANGIOGRAPHY CHEST  Technique:  Multidetector CT imaging of the chest using the standard protocol during bolus administration of intravenous contrast. Multiplanar reconstructed  images including MIPs were obtained and reviewed to evaluate the vascular anatomy.  Contrast: OMNIPAQUE IOHEXOL 350 MG/ML SOLN  Comparison: Chest radiograph performed 11/22/2011, and CTA of the chest performed 06/19/2011  Findings: There is no evidence of pulmonary embolus.  A mild mosaic pattern of parenchymal attenuation is noted within both lungs, nonspecific in appearance.  There is focal bronchiectasis and scarring at the left lingula.  Additional mild bronchiectasis is noted at the medial left lung base.  Mild nodularity within the right middle and lower lobes along the right major fissure may reflect an infectious or inflammatory process.  There is no evidence of significant focal consolidation, pleural effusion or pneumothorax.  No abnormal focal contrast enhancement is seen.  The mediastinum is grossly unremarkable in appearance.  No mediastinal lymphadenopathy is seen.  No pericardial effusion is identified.  The great vessels are grossly unremarkable in appearance.  No axillary lymphadenopathy is seen.  The visualized portions of the thyroid gland are unremarkable in appearance.  The visualized portions of the liver and spleen are unremarkable. The visualized portions of the pancreas, gallbladder, adrenal glands and kidneys are within normal limits.  A moderate hiatal hernia is seen.  No acute osseous abnormalities are seen.  IMPRESSION:  1.  No evidence of pulmonary embolus. 2.  Mild mosaic pattern of parenchymal attenuation within both lungs; this may reflect an infectious or inflammatory process. 3.  Focal bronchiectasis and scarring at the left lingula, and mild bronchiectasis at the medial left lung base. 4.  Mild nodularity within the right middle and lower lobes may reflect an infectious or inflammatory process. 5.  Moderate hiatal hernia seen.   Original Report Authenticated By: Tonia Ghent, M.D.    Gna Rad Results  10/30/2011  Ordered by an unspecified provider.   Gna Rad  Results  10/30/2011  Ordered by an unspecified provider.   Dg Chest Port 1 View  11/22/2011  *RADIOLOGY REPORT*  Clinical Data: Near-syncope  PORTABLE CHEST - 1 VIEW  Comparison: 10/10/2011  Findings: Cardiomediastinal silhouette is stable.  No acute infiltrate or pleural effusion.  No pulmonary edema.  Hiatal hernia again noted.  IMPRESSION: No active disease.  No significant change.   Original Report Authenticated By: Natasha Mead, M.D.     Microbiology: No results found for this or any previous visit (from the  past 240 hour(s)).   Labs: Basic Metabolic Panel:  Lab 11/24/11 0981 11/23/11 0755 11/22/11 2100  NA 140 139 139  K 4.0 3.3* 4.0  CL 103 101 103  CO2 26 25 24   GLUCOSE 81 152* 92  BUN 13 11 15   CREATININE 1.04 0.80 0.94  CALCIUM 8.8 9.2 8.7  MG -- 2.0 --  PHOS -- 3.0 --   Liver Function Tests:  Lab 11/23/11 0755  AST 24  ALT 10  ALKPHOS 90  BILITOT 0.2*  PROT 7.0  ALBUMIN 3.8   No results found for this basename: LIPASE:5,AMYLASE:5 in the last 168 hours No results found for this basename: AMMONIA:5 in the last 168 hours CBC:  Lab 11/24/11 0450 11/23/11 0755 11/22/11 2100  WBC 7.8 7.2 10.4  NEUTROABS -- -- --  HGB 11.5* 12.0 11.5*  HCT 36.5 37.1 35.5*  MCV 86.9 87.3 87.7  PLT 243 251 238   Cardiac Enzymes:  Lab 11/23/11 1430 11/23/11 1004 11/23/11 0755  CKTOTAL -- -- --  CKMB -- -- --  CKMBINDEX -- -- --  TROPONINI <0.30 <0.30 <0.30   BNP: BNP (last 3 results) No results found for this basename: PROBNP:3 in the last 8760 hours CBG: No results found for this basename: GLUCAP:5 in the last 168 hours     Signed:  Masen Salvas,CHRISTOPHER  [facility] 11/24/2011, 10:13 AM

## 2011-11-25 ENCOUNTER — Telehealth: Payer: Self-pay | Admitting: Cardiovascular Disease

## 2011-11-25 NOTE — Telephone Encounter (Signed)
error 

## 2011-11-28 ENCOUNTER — Ambulatory Visit (INDEPENDENT_AMBULATORY_CARE_PROVIDER_SITE_OTHER): Payer: Medicare Other | Admitting: Nurse Practitioner

## 2011-11-28 ENCOUNTER — Encounter: Payer: Self-pay | Admitting: Nurse Practitioner

## 2011-11-28 VITALS — BP 120/74 | HR 76 | Ht 67.5 in | Wt 133.6 lb

## 2011-11-28 DIAGNOSIS — F411 Generalized anxiety disorder: Secondary | ICD-10-CM | POA: Diagnosis not present

## 2011-11-28 DIAGNOSIS — I10 Essential (primary) hypertension: Secondary | ICD-10-CM

## 2011-11-28 DIAGNOSIS — F410 Panic disorder [episodic paroxysmal anxiety] without agoraphobia: Secondary | ICD-10-CM | POA: Diagnosis not present

## 2011-11-28 NOTE — Patient Instructions (Signed)
Stay on this current list of medicines  See your primary care doctor this afternoon as scheduled. Show her your rash  Monitor your blood pressure at home. Call us if you have consistent readings above 140  Use your Xanax as directed  See Dr. Elease Hashimoto in early December as planned  Call the [facility] office at [phone] if you have any questions, problems or concerns.

## 2011-11-28 NOTE — Progress Notes (Signed)
Sarah [name] Date of Birth: 06/01/1921 Medical Record #[id number]  History of Present Illness: Sarah [name] is seen back today for a post hospital visit. She is seen for Dr. Elease Hashimoto. Her history is complex. She is 89. She has HTN, asthma, recurrent bronchitis, depression, OA, and hypothyroidism. She had a NSTEMI in August of last year with cath showing no obstructive CAD. Negative for PE as well. Has been managed medically. Also has bronchiectasis and MAC is followed by Dr. Solon Augusta. She has had multiple tests, medicines adjusted and has continued to have spells of "impending doom". Blood pressure got really low over the past 4 to 6 weeks and we have cut her medicines back and stopped many of her medicines. She has had a negative event monitor this year. Negative renal duplex. Normal coronaries per cath.   She was back in the hospital earlier this month. Had another spell of "impending doom". BP was markedly up. She had not slept for the 3 nights prior. Does not know why. Is quite anxious. Blood pressure since discharge has been ok and is good here today. She is worried about a rash she has on her chest. Looks like it was from the electrodes. She is back on antibiotic which she held. Not sure why she is on this.   Current Outpatient Prescriptions on File Prior to Visit  Medication Sig Dispense Refill  . acetaminophen-codeine (TYLENOL #3) 300-30 MG per tablet Take 1 tablet by mouth every 4 (four) hours as needed. For pain      . albuterol (PROVENTIL HFA;VENTOLIN HFA) 108 (90 BASE) MCG/ACT inhaler Inhale 2 puffs into the lungs every 6 (six) hours as needed. For shortness of breath      . ALPRAZolam (XANAX) 0.25 MG tablet Take 1 tablet (0.25 mg total) by mouth 3 (three) times daily.  90 tablet  0  . aspirin EC 81 MG tablet Take 81 mg by mouth daily.      . budesonide-formoterol (SYMBICORT) 160-4.5 MCG/ACT inhaler Inhale 2 puffs into the lungs 2 (two) times daily.  1 Inhaler  12  . citalopram (CELEXA) 40 MG  tablet Take 20 mg by mouth daily.       Marland Kitchen estradiol (VIVELLE-DOT) 0.05 MG/24HR Place 1 patch (0.05 mg total) onto the skin once a week. Apply on Thursday  8 patch  11  . feeding supplement (ENSURE) PUDG Take 1 Container by mouth 3 (three) times daily between meals.  90 Container  0  . fluticasone (FLONASE) 50 MCG/ACT nasal spray Place 2 sprays into the nose daily as needed. For congestion      . isosorbide mononitrate (IMDUR) 30 MG 24 hr tablet Take 1 tablet (30 mg total) by mouth daily.  30 tablet  11  . levothyroxine (SYNTHROID, LEVOTHROID) 50 MCG tablet Take 50 mcg by mouth daily.        . metoprolol tartrate (LOPRESSOR) 25 MG tablet Take 12.5 mg by mouth 2 (two) times daily as needed.      . montelukast (SINGULAIR) 10 MG tablet Take 10 mg by mouth daily as needed. For allergies      . nitroGLYCERIN (NITROSTAT) 0.4 MG SL tablet Place 0.4 mg under the tongue every 5 (five) minutes as needed. For chest pain      . pregabalin (LYRICA) 50 MG capsule Take 50 mg by mouth 2 (two) times daily.       . simvastatin (ZOCOR) 20 MG tablet Take 1 tablet (20 mg total) by mouth every evening.  30 tablet  5  . sulfamethoxazole-trimethoprim (BACTRIM DS) 800-160 MG per tablet Take 1 tablet by mouth every 12 (twelve) hours.  12 tablet  0    Allergies  Allergen Reactions  . Hydrocodone Nausea And Vomiting and Other (See Comments)    Passed out, threw up, blood pressure dropped  . Levofloxacin Other (See Comments)    Pass out  . Oxycodone Other (See Comments)    Rapid heart beat  . Clarithromycin Other (See Comments)    unknown    Past Medical History  Diagnosis Date  . Bronchiectasis     with history of Legionaires with chronic rales/rhonchi  . Pollen allergies   . Asthma   . Hypertension     Negative renal duplex in December of 2012  . Allergic rhinitis   . NSTEMI (non-ST elevated myocardial infarction)     a. Normal coronaries per cath August 2012 and negative CT angio for PE;  b. 06/2011 Repeat  admission w/ chest pain and elevated troponin's, CTA Chest w/o PE  . Diastolic dysfunction     a. per echo 08/2010 with normal LV function;  b. 06/2011 Echo: EF 65-70%  . Idiopathic peripheral neuropathy   . Hypothyroidism   . Depression   . Shortness of breath   . Pneumonia     hx of pna  . H/O hiatal hernia   . Arthritis   . Osteopenia 02/2011    t score - 2.1    Past Surgical History  Procedure Date  . Dilation and curettage of uterus   . Total abdominal hysterectomy w/ bilateral salpingoophorectomy 1972    leiomyomata, menorrhagia  . Appendectomy   . Cardiac catheterization August 2012    Normal coronaries.  . Tonsillectomy     History  Smoking status  . Never Smoker   Smokeless tobacco  . Never Used    History  Alcohol Use  . Yes    Comment: wine 1-2 week    Family History  Problem Relation Age of Onset  . Hypertension Mother     stroke  . Cancer Brother     prostrate    Review of Systems: The review of systems is per the HPI.  All other systems were reviewed and are negative.  Physical Exam: BP 120/74  Pulse 76  Ht 5' 7.5" (1.715 m)  Wt 133 lb 9.6 oz (60.601 kg)  BMI 20.62 kg/m2  LMP 01/14/1970 Patient is very pleasant and in no acute distress. Skin is warm and dry. Color is normal.  HEENT is unremarkable. Normocephalic/atraumatic. PERRL. Sclera are nonicteric. Neck is supple. No masses. No JVD. Lungs are clear. She has two circular quarter size places on her chest which looks like an allergic reaction. Cardiac exam shows a regular rate and rhythm. Abdomen is soft. Extremities are without edema. Gait and ROM are intact. No gross neurologic deficits noted.   LABORATORY DATA:  Wt Readings from Last 3 Encounters:  11/28/11 133 lb 9.6 oz (60.601 kg)  11/23/11 132 lb 7.9 oz (60.1 kg)  11/11/11 138 lb 6.4 oz (62.778 kg)   Lab Results  Component Value Date   WBC 7.8 11/24/2011   HGB 11.5* 11/24/2011   HCT 36.5 11/24/2011   PLT 243 11/24/2011    GLUCOSE 81 11/24/2011   CHOL 143 10/29/2011   TRIG 63.0 10/29/2011   HDL 65.40 10/29/2011   LDLCALC 65 10/29/2011   ALT 10 11/23/2011   AST 24 11/23/2011   NA 140 11/24/2011  K 4.0 11/24/2011   CL 103 11/24/2011   CREATININE 1.04 11/24/2011   BUN 13 11/24/2011   CO2 26 11/24/2011   TSH 3.321 11/23/2011   INR 1.00 06/19/2011   HGBA1C 6.1* 09/06/2010   Ct Angio Chest Pe W/cm &/or Wo Cm  11/23/2011  *RADIOLOGY REPORT*  Clinical Data: Chest pain; fatigue.  Hypertension.  CT ANGIOGRAPHY CHEST  Technique:  Multidetector CT imaging of the chest using the standard protocol during bolus administration of intravenous contrast. Multiplanar reconstructed images including MIPs were obtained and reviewed to evaluate the vascular anatomy.  Contrast: OMNIPAQUE IOHEXOL 350 MG/ML SOLN  Comparison: Chest radiograph performed 11/22/2011, and CTA of the chest performed 06/19/2011  Findings: There is no evidence of pulmonary embolus.  A mild mosaic pattern of parenchymal attenuation is noted within both lungs, nonspecific in appearance.  There is focal bronchiectasis and scarring at the left lingula.  Additional mild bronchiectasis is noted at the medial left lung base.  Mild nodularity within the right middle and lower lobes along the right major fissure may reflect an infectious or inflammatory process.  There is no evidence of significant focal consolidation, pleural effusion or pneumothorax.  No abnormal focal contrast enhancement is seen.  The mediastinum is grossly unremarkable in appearance.  No mediastinal lymphadenopathy is seen.  No pericardial effusion is identified.  The great vessels are grossly unremarkable in appearance.  No axillary lymphadenopathy is seen.  The visualized portions of the thyroid gland are unremarkable in appearance.  The visualized portions of the liver and spleen are unremarkable. The visualized portions of the pancreas, gallbladder, adrenal glands and kidneys are within normal  limits.  A moderate hiatal hernia is seen.  No acute osseous abnormalities are seen.  IMPRESSION:  1.  No evidence of pulmonary embolus. 2.  Mild mosaic pattern of parenchymal attenuation within both lungs; this may reflect an infectious or inflammatory process. 3.  Focal bronchiectasis and scarring at the left lingula, and mild bronchiectasis at the medial left lung base. 4.  Mild nodularity within the right middle and lower lobes may reflect an infectious or inflammatory process. 5.  Moderate hiatal hernia seen.   Original Report Authenticated By: Tonia Ghent, M.D.    Dg Chest Port 1 View  11/22/2011  *RADIOLOGY REPORT*  Clinical Data: Near-syncope  PORTABLE CHEST - 1 VIEW  Comparison: 10/10/2011  Findings: Cardiomediastinal silhouette is stable.  No acute infiltrate or pleural effusion.  No pulmonary edema.  Hiatal hernia again noted.  IMPRESSION: No active disease.  No significant change.   Original Report Authenticated By: Natasha Mead, M.D.     Assessment / Plan: 1. Chest pain/spells of impending doom - seem to be related to her BP. She has had extensive testing. I do not have any explanation at this time. I suspect her no sleep for 3 nights prior to this spell may be the culprit. Would use the Xanax prn.  2. Labile blood pressure - BP is great here today. Just on metoprolol. She will monitor. Call if readings are above 140 consistently. She was markedly hypotensive just a month ago with systolic readings in the 70's. I have not added back any medicines today.   3. Rash - looks to be from the electrodes. I don't think this is shingles.   She is seeing her PCP later this afternoon. I do not have anything to add at this time. She is seeing Dr. Elease Hashimoto in early December.   Patient is agreeable to this plan  and will call if any problems develop in the interim.

## 2011-12-09 DIAGNOSIS — H903 Sensorineural hearing loss, bilateral: Secondary | ICD-10-CM | POA: Diagnosis not present

## 2011-12-16 ENCOUNTER — Encounter: Payer: Self-pay | Admitting: Cardiovascular Disease

## 2011-12-16 ENCOUNTER — Ambulatory Visit (INDEPENDENT_AMBULATORY_CARE_PROVIDER_SITE_OTHER): Payer: Medicare Other | Admitting: Cardiovascular Disease

## 2011-12-16 VITALS — BP 138/90 | HR 65 | Ht 67.5 in | Wt 137.0 lb

## 2011-12-16 DIAGNOSIS — I10 Essential (primary) hypertension: Secondary | ICD-10-CM | POA: Diagnosis not present

## 2011-12-16 MED ORDER — PROPRANOLOL HCL 10 MG PO TABS: 10.0000 mg | ORAL_TABLET | Freq: Four times a day (QID) | ORAL | Status: DC

## 2011-12-16 NOTE — Assessment & Plan Note (Addendum)
Sarah [name] continues to have episodes of intermittent hypertension. For the most part her blood pressure is well-controlled.  I've recommended that she try taking the propranolol 10 mg as needed if she's feeling poorly and if her blood pressures high. Another alternative would be for her to take a half a lisinopril tablet or a half of an amlodipine tablet.  I'll see her in 6 months for followup visit.

## 2011-12-16 NOTE — Patient Instructions (Addendum)
Your physician wants you to follow-up in: 6 months You will receive a reminder letter in the mail two months in advance. If you don't receive a letter, please call our office to schedule the follow-up appointment.  Your physician has recommended you make the following change in your medication:   Start propranolol 10 mg one tablet up to 4 times daily- 30 minutes apart. For palpitations.

## 2011-12-16 NOTE — Progress Notes (Signed)
Sarah [name] Date of Birth: 1921/05/05 Medical Record #[id number]  Problem list: 1. Hypertension 2. Asthma 3. Depression 4. Coronary artery disease-status post non-ST segment elevation myocardial infarction. Cardiac cath revealed nonobstructive coronary artery disease 5. Bronchiectasis   History of Present Illness: Sarah [name] is seen back today following a recent hospitalization. She was admitted to the hospital with chest tightness. She ruled out for myocardial infarction. She has a history of chest pain in the past and a cardiac catheterization in 2012 was unremarkable.  She originally called EMS when her BP was 220/110  She has had significant difficulty controlling her BP recently.   She has been watching her salt.  She has taken amlodipine and Lisinopril in the past but these have been stopped because she seemed to be overmedicated.    She continues to have intermittent episodes of chest pressure and heaviness.  These are typically associated with marked hypertension although it is not clear whether the hypertension occurs first or as a result of her  feeling poorly.  Current Outpatient Prescriptions on File Prior to Visit  Medication Sig Dispense Refill  . acetaminophen-codeine (TYLENOL #3) 300-30 MG per tablet Take 1 tablet by mouth every 4 (four) hours as needed. For pain      . albuterol (PROVENTIL HFA;VENTOLIN HFA) 108 (90 BASE) MCG/ACT inhaler Inhale 2 puffs into the lungs every 6 (six) hours as needed. For shortness of breath      . ALPRAZolam (XANAX) 0.25 MG tablet Take 1 tablet (0.25 mg total) by mouth 3 (three) times daily.  90 tablet  0  . aspirin EC 81 MG tablet Take 81 mg by mouth daily.      . budesonide-formoterol (SYMBICORT) 160-4.5 MCG/ACT inhaler Inhale 2 puffs into the lungs 2 (two) times daily.  1 Inhaler  12  . citalopram (CELEXA) 40 MG tablet Take 20 mg by mouth daily.       Marland Kitchen estradiol (VIVELLE-DOT) 0.05 MG/24HR Place 1 patch (0.05 mg total) onto the skin  once a week. Apply on Thursday  8 patch  11  . feeding supplement (ENSURE) PUDG Take 1 Container by mouth 3 (three) times daily between meals.  90 Container  0  . fluticasone (FLONASE) 50 MCG/ACT nasal spray Place 2 sprays into the nose daily as needed. For congestion      . isosorbide mononitrate (IMDUR) 30 MG 24 hr tablet Take 1 tablet (30 mg total) by mouth daily.  30 tablet  11  . levothyroxine (SYNTHROID, LEVOTHROID) 50 MCG tablet Take 50 mcg by mouth daily.        . metoprolol tartrate (LOPRESSOR) 25 MG tablet Take 12.5 mg by mouth 2 (two) times daily as needed.      . montelukast (SINGULAIR) 10 MG tablet Take 10 mg by mouth daily as needed. For allergies      . nitroGLYCERIN (NITROSTAT) 0.4 MG SL tablet Place 0.4 mg under the tongue every 5 (five) minutes as needed. For chest pain      . pregabalin (LYRICA) 50 MG capsule Take 50 mg by mouth 2 (two) times daily.       . simvastatin (ZOCOR) 20 MG tablet Take 1 tablet (20 mg total) by mouth every evening.  30 tablet  5    Allergies  Allergen Reactions  . Hydrocodone Nausea And Vomiting and Other (See Comments)    Passed out, threw up, blood pressure dropped  . Levofloxacin Other (See Comments)    Pass out  . Oxycodone  Other (See Comments)    Rapid heart beat  . Clarithromycin Other (See Comments)    unknown    Past Medical History  Diagnosis Date  . Bronchiectasis     with history of Legionaires with chronic rales/rhonchi  . Pollen allergies   . Asthma   . Hypertension     Negative renal duplex in December of 2012  . Allergic rhinitis   . NSTEMI (non-ST elevated myocardial infarction)     a. Normal coronaries per cath August 2012 and negative CT angio for PE;  b. 06/2011 Repeat admission w/ chest pain and elevated troponin's, CTA Chest w/o PE  . Diastolic dysfunction     a. per echo 08/2010 with normal LV function;  b. 06/2011 Echo: EF 65-70%  . Idiopathic peripheral neuropathy   . Hypothyroidism   . Depression   . Shortness  of breath   . Pneumonia     hx of pna  . H/O hiatal hernia   . Arthritis   . Osteopenia 02/2011    t score - 2.1    Past Surgical History  Procedure Date  . Dilation and curettage of uterus   . Total abdominal hysterectomy w/ bilateral salpingoophorectomy 1972    leiomyomata, menorrhagia  . Appendectomy   . Cardiac catheterization August 2012    Normal coronaries.  . Tonsillectomy     History  Smoking status  . Never Smoker   Smokeless tobacco  . Never Used    History  Alcohol Use  . Yes    Comment: wine 1-2 week    Family History  Problem Relation Age of Onset  . Hypertension Mother     stroke  . Cancer Brother     prostrate    Review of Systems: The review of systems is per the HPI.  All other systems were reviewed and are negative.  Physical Exam: BP 138/90  Pulse 65  Ht 5' 7.5" (1.715 m)  Wt 137 lb (62.143 kg)  BMI 21.14 kg/m2  SpO2 97%  LMP 01/14/1970 Patient is very pleasant and in no acute distress. Skin is warm and dry. Color is normal.  HEENT is unremarkable. Normocephalic/atraumatic. PERRL. Sclera are nonicteric. Neck is supple. No masses. No JVD. Lungs show some persistant rales which are chronic. Cardiac exam shows a regular rate and rhythm. Abdomen is soft. Extremities are without edema. Gait and ROM are intact. No gross neurologic deficits noted.  LABORATORY DATA:  Lab Results  Component Value Date   WBC 7.8 11/24/2011   HGB 11.5* 11/24/2011   HCT 36.5 11/24/2011   PLT 243 11/24/2011   GLUCOSE 81 11/24/2011   CHOL 143 10/29/2011   TRIG 63.0 10/29/2011   HDL 65.40 10/29/2011   LDLCALC 65 10/29/2011   ALT 10 11/23/2011   AST 24 11/23/2011   NA 140 11/24/2011   K 4.0 11/24/2011   CL 103 11/24/2011   CREATININE 1.04 11/24/2011   BUN 13 11/24/2011   CO2 26 11/24/2011   TSH 3.321 11/23/2011   INR 1.00 06/19/2011   HGBA1C 6.1* 09/06/2010     Assessment / Plan:

## 2011-12-26 ENCOUNTER — Ambulatory Visit (INDEPENDENT_AMBULATORY_CARE_PROVIDER_SITE_OTHER): Payer: Medicare Other | Admitting: Psychiatry

## 2011-12-26 DIAGNOSIS — F410 Panic disorder [episodic paroxysmal anxiety] without agoraphobia: Secondary | ICD-10-CM | POA: Diagnosis not present

## 2012-01-07 ENCOUNTER — Other Ambulatory Visit (HOSPITAL_COMMUNITY): Payer: Self-pay | Admitting: Internal Medicine

## 2012-01-14 DIAGNOSIS — Z23 Encounter for immunization: Secondary | ICD-10-CM | POA: Diagnosis not present

## 2012-01-21 ENCOUNTER — Ambulatory Visit (INDEPENDENT_AMBULATORY_CARE_PROVIDER_SITE_OTHER): Payer: Medicare Other | Admitting: Psychiatry

## 2012-01-21 DIAGNOSIS — F410 Panic disorder [episodic paroxysmal anxiety] without agoraphobia: Secondary | ICD-10-CM | POA: Diagnosis not present

## 2012-02-06 ENCOUNTER — Ambulatory Visit (INDEPENDENT_AMBULATORY_CARE_PROVIDER_SITE_OTHER): Payer: Medicare Other | Admitting: Psychiatry

## 2012-02-06 DIAGNOSIS — F410 Panic disorder [episodic paroxysmal anxiety] without agoraphobia: Secondary | ICD-10-CM

## 2012-02-10 ENCOUNTER — Telehealth: Payer: Self-pay | Admitting: Cardiovascular Disease

## 2012-02-10 NOTE — Telephone Encounter (Signed)
New Problem    Has had continues BP readings over 140 (syst)  appt is made with Lawson Fiscal on Wed at 1:30

## 2012-02-10 NOTE — Telephone Encounter (Signed)
Pts daughter, Johnny Bridge, states that she just needed to schedule an appt for the pt due to her elevated BP.

## 2012-02-12 ENCOUNTER — Ambulatory Visit: Payer: Medicare Other | Admitting: Nurse Practitioner

## 2012-02-18 ENCOUNTER — Encounter: Payer: Self-pay | Admitting: Nurse Practitioner

## 2012-02-18 ENCOUNTER — Ambulatory Visit (INDEPENDENT_AMBULATORY_CARE_PROVIDER_SITE_OTHER): Payer: Medicare Other | Admitting: Nurse Practitioner

## 2012-02-18 VITALS — BP 140/60 | HR 60 | Ht 67.0 in | Wt 137.1 lb

## 2012-02-18 DIAGNOSIS — I10 Essential (primary) hypertension: Secondary | ICD-10-CM | POA: Diagnosis not present

## 2012-02-18 NOTE — Patient Instructions (Addendum)
Stay on your current medicines  Do not check your blood pressure more than 3 times a day  If your blood pressure stays consistently above 160 systolic - then call us  We will see you back at your regular visit.   Call the [facility] office at [phone] if you have any questions, problems or concerns.

## 2012-02-18 NOTE — Progress Notes (Signed)
[name] Date of Birth: 06/14/1921 Medical Record #[id number]  History of Present Illness: Sarah [name] is seen back today for a work in visit. She is seen for Dr. Elease Hashimoto. She has a quite complex medical history. She is 89. She has HTN, asthma, recurrent bronchitis, depression, OA, and hypothyroidism. She had a NSTEMI in August of last year with cath showing no obstructive CAD. Negative for PE as well. Has been managed medically. Also has bronchiectasis and MAC is followed by Dr. Solon Augusta. She has had multiple tests, medicines adjusted and has continued to have spells of "impending doom". Blood pressure got really low late last year and we had to cut her medicines back and stopped many of her medicines due to symptomatic orthostatic hypotension. She has had a negative event monitor last year. Negative renal duplex. Normal coronaries per cath.   Dr. Elease Hashimoto saw her in December. He added prn Inderal.   She comes back today. She is here alone. She is here because of concern for her BP. She has multiple readings - sometimes 10 or more a day. Variable control. She is back on Lisinopril. Not on our list here. Now on Xanax two times a day and seeing a psychologist to help with her anxiety. Says it makes her sleepy. No falls. No real chest pain. Not using the Inderal.   Current Outpatient Prescriptions on File Prior to Visit  Medication Sig Dispense Refill  . acetaminophen-codeine (TYLENOL #3) 300-30 MG per tablet Take 1 tablet by mouth every 4 (four) hours as needed. For pain      . albuterol (PROVENTIL HFA;VENTOLIN HFA) 108 (90 BASE) MCG/ACT inhaler Inhale 2 puffs into the lungs every 6 (six) hours as needed. For shortness of breath      . ALPRAZolam (XANAX) 0.25 MG tablet Take 1 tablet (0.25 mg total) by mouth 3 (three) times daily.  90 tablet  0  . aspirin EC 81 MG tablet Take 81 mg by mouth daily.      . budesonide-formoterol (SYMBICORT) 160-4.5 MCG/ACT inhaler Inhale 2 puffs into the lungs 2 (two)  times daily.  1 Inhaler  12  . citalopram (CELEXA) 40 MG tablet Take 20 mg by mouth daily.       Marland Kitchen estradiol (VIVELLE-DOT) 0.05 MG/24HR Place 1 patch (0.05 mg total) onto the skin once a week. Apply on Thursday  8 patch  11  . isosorbide mononitrate (IMDUR) 30 MG 24 hr tablet Take 1 tablet (30 mg total) by mouth daily.  30 tablet  11  . levothyroxine (SYNTHROID, LEVOTHROID) 50 MCG tablet Take 50 mcg by mouth daily.        . metoprolol tartrate (LOPRESSOR) 25 MG tablet TAKE 1/2 TABLET BY MOUTH TWICE DAILY  60 tablet  11  . montelukast (SINGULAIR) 10 MG tablet Take 10 mg by mouth daily as needed. For allergies      . nitroGLYCERIN (NITROSTAT) 0.4 MG SL tablet Place 0.4 mg under the tongue every 5 (five) minutes as needed. For chest pain      . pregabalin (LYRICA) 50 MG capsule Take 50 mg by mouth 2 (two) times daily.       . propranolol (INDERAL) 10 MG tablet Take 1 tablet (10 mg total) by mouth 4 (four) times daily. As needed for palpitations  30 tablet  6  . simvastatin (ZOCOR) 20 MG tablet Take 1 tablet (20 mg total) by mouth every evening.  30 tablet  5    Allergies  Allergen Reactions  .  Hydrocodone Nausea And Vomiting and Other (See Comments)    Passed out, threw up, blood pressure dropped  . Levofloxacin Other (See Comments)    Pass out  . Oxycodone Other (See Comments)    Rapid heart beat  . Clarithromycin Other (See Comments)    unknown    Past Medical History  Diagnosis Date  . Bronchiectasis     with history of Legionaires with chronic rales/rhonchi  . Pollen allergies   . Asthma   . Hypertension     Negative renal duplex in December of 2012  . Allergic rhinitis   . NSTEMI (non-ST elevated myocardial infarction)     a. Normal coronaries per cath August 2012 and negative CT angio for PE;  b. 06/2011 Repeat admission w/ chest pain and elevated troponin's, CTA Chest w/o PE  . Diastolic dysfunction     a. per echo 08/2010 with normal LV function;  b. 06/2011 Echo: EF 65-70%  .  Idiopathic peripheral neuropathy   . Hypothyroidism   . Depression   . Shortness of breath   . Pneumonia     hx of pna  . H/O hiatal hernia   . Arthritis   . Osteopenia 02/2011    t score - 2.1    Past Surgical History  Procedure Date  . Dilation and curettage of uterus   . Total abdominal hysterectomy w/ bilateral salpingoophorectomy 1972    leiomyomata, menorrhagia  . Appendectomy   . Cardiac catheterization August 2012    Normal coronaries.  . Tonsillectomy     History  Smoking status  . Never Smoker   Smokeless tobacco  . Never Used    History  Alcohol Use  . Yes    Comment: wine 1-2 week    Family History  Problem Relation Age of Onset  . Hypertension Mother     stroke  . Cancer Brother     prostrate    Review of Systems: The review of systems is per the HPI.  All other systems were reviewed and are negative.  Physical Exam: BP 140/60  Pulse 60  Ht 5\' 7"  (1.702 m)  Wt 137 lb 1.9 oz (62.197 kg)  BMI 21.48 kg/m2  LMP 01/14/1970 Patient is very pleasant and in no acute distress. She is elderly. Seems a little sedated to me today. Recheck of her blood pressure by me is fine. Skin is warm and dry. Color is normal.  HEENT is unremarkable. Normocephalic/atraumatic. PERRL. Sclera are nonicteric. Neck is supple. No masses. No JVD. Lungs are clear. Cardiac exam shows a regular rate and rhythm. Abdomen is soft. Extremities are without edema. Gait and ROM are intact. No gross neurologic deficits noted.   LABORATORY DATA: N/A   Assessment / Plan: 1. HTN - she is checking her blood pressure too much. I think she has fair control for the most part. I am very hesitant to add anything given her history of orthostasis. Her salt use may be inconsistent. I have asked her to check only 3 times a day. If she consistently stays above 160 systolic, then will consider adding something but for now I think she is ok.   2. Anxiety - now on Xanax. I cautioned her about this.  She does seem more sedated to me today.   We will tentatively see her back at her regular visit.   Patient is agreeable to this plan and will call if any problems develop in the interim.

## 2012-02-19 ENCOUNTER — Ambulatory Visit (INDEPENDENT_AMBULATORY_CARE_PROVIDER_SITE_OTHER): Payer: Medicare Other | Admitting: Psychiatry

## 2012-02-19 DIAGNOSIS — F410 Panic disorder [episodic paroxysmal anxiety] without agoraphobia: Secondary | ICD-10-CM | POA: Diagnosis not present

## 2012-02-24 ENCOUNTER — Ambulatory Visit
Admission: RE | Admit: 2012-02-24 | Discharge: 2012-02-24 | Disposition: A | Discharge status: Home / Self Care | Payer: Medicare Other | Source: Ambulatory Visit | Attending: Emergency Medicine | Admitting: Emergency Medicine

## 2012-02-24 DIAGNOSIS — J479 Bronchiectasis, uncomplicated: Secondary | ICD-10-CM

## 2012-03-03 ENCOUNTER — Ambulatory Visit (INDEPENDENT_AMBULATORY_CARE_PROVIDER_SITE_OTHER): Payer: Medicare Other | Admitting: Psychiatry

## 2012-03-03 DIAGNOSIS — F410 Panic disorder [episodic paroxysmal anxiety] without agoraphobia: Secondary | ICD-10-CM

## 2012-03-03 NOTE — Progress Notes (Signed)
Quick Note:  ATC patient, no answer at home number (rang several times no option to lmom)  [email or web address] at mobile number ______

## 2012-03-06 DIAGNOSIS — J479 Bronchiectasis, uncomplicated: Secondary | ICD-10-CM | POA: Diagnosis not present

## 2012-03-06 DIAGNOSIS — F410 Panic disorder [episodic paroxysmal anxiety] without agoraphobia: Secondary | ICD-10-CM | POA: Diagnosis not present

## 2012-03-10 ENCOUNTER — Encounter: Payer: Self-pay | Admitting: Emergency Medicine

## 2012-03-10 ENCOUNTER — Ambulatory Visit (INDEPENDENT_AMBULATORY_CARE_PROVIDER_SITE_OTHER): Payer: Medicare Other | Admitting: Emergency Medicine

## 2012-03-10 VITALS — BP 110/80 | HR 61 | Temp 97.6°F | Ht 67.0 in | Wt 137.8 lb

## 2012-03-10 DIAGNOSIS — R05 Cough: Secondary | ICD-10-CM | POA: Diagnosis not present

## 2012-03-10 DIAGNOSIS — J479 Bronchiectasis, uncomplicated: Secondary | ICD-10-CM | POA: Diagnosis not present

## 2012-03-10 DIAGNOSIS — J45909 Unspecified asthma, uncomplicated: Secondary | ICD-10-CM | POA: Diagnosis not present

## 2012-03-10 MED ORDER — ALBUTEROL SULFATE HFA 108 (90 BASE) MCG/ACT IN AERS: 2.0000 | INHALATION_SPRAY | Freq: Four times a day (QID) | RESPIRATORY_TRACT | Status: DC | PRN

## 2012-03-10 NOTE — Progress Notes (Signed)
79 yo never smoker, hx remote Legionella PNA, diagnosed with suspected asthma about 10 years ago. PFT's 7/10 with mixed disease, mild AFL. Formerly treated with Advair and albuterol, stopped when she moved to [place] in 2008. Stayed on Singulair. At original consult we d/c'd ACE-I. Started on Symbicort.   ROV 11/11/11 --  hx bronchiectasis, chronic coughing, CAD. CT scan consistent w MAIC. We have discussed empiric MAIC therapy, but decided to defer for now. She tells me that she was treated for bronchitis recently with abx + pred. Last time we added symbicort, she feels that it is helping her >> her cough is better.   ROV 03/10/12 -- hx bronchiectasis, chronic coughing, CAD. CT scan consistent w MAIC. She has had trouble since last visit, she has been dealing w panic attacks - some better on xanax. She has had some scratchy throat, hoarse voice. Her cough has overall been better. She remains on symbicort, uses albuterol 1-2x a day. Feels "bad" sometimes, weak, fatigued. ? Whether this relates to her hx MAIC. Remains on lisinopril.   Filed Vitals:   03/10/12 1341  BP: 110/80  Pulse: 61  Temp: 97.6 F (36.4 C)    Gen: Pleasant, thin, in no distress, normal affect  ENT: No lesions,  mouth clear,  oropharynx clear, no postnasal drip  Neck: No JVD, no TMG, no carotid bruits  Lungs: No use of accessory muscles, no dullness to percussion, few scattered insp crackles L>>R  Cardiovascular: RRR, heart sounds normal, no murmur or gallops, no peripheral edema  Musculoskeletal: No deformities, no cyanosis or clubbing  Neuro: alert, non focal  Skin: Warm, no lesions or rashes  03/10/12 --  Comparison: 11/23/2011.  Findings: Mediastinal lymph nodes measure up to 12 mm in the  precarinal station, as before. Hilar regions are difficult to  definitively evaluate without IV contrast. No axillary adenopathy.  Heart is mildly enlarged. No pericardial effusion. Moderate  hiatal hernia.  Trace amount of  loculated pleural fluid is seen in the posterior  medial inferior left hemithorax. Mild biapical pleural parenchymal  scarring. Interval progression of bibasilar predominant  peribronchovascular nodularity, areas of airspace consolidation and  scattered bronchiectasis. Airway is otherwise unremarkable.  Incidental imaging of the upper abdomen shows no acute findings.  No worrisome lytic or sclerotic lesions. Degenerative changes are  seen in the spine.  IMPRESSION:  1. Interval progression of patchy bibasilar predominant  peribronchovascular nodularity, consolidation and bronchiectasis.  Findings are suggestive of mycobacterium avium complex (MAC).  2. Tiny loculated left pleural fluid collection.     ASTHMA Continue symbicort, although this may be irritating her throat with no real benefits. Will continue for now although we could consider stopping in the future.   BRONCHIECTASIS Her CT scan has progressed and she may have some clinical worsening. I am concerned that she would tolerate abx poorly - she would like to avoid if possible. I will follow her in 3 months, decide at that time whether to start abx for [facility]  COUGH Bronchiectasis, allergies. If she worsens, best strategy would be to change her lisinopril

## 2012-03-10 NOTE — Assessment & Plan Note (Addendum)
Her CT scan has progressed and she may have some clinical worsening. I am concerned that she would tolerate abx poorly - she would like to avoid if possible. I will follow her in 3 months, decide at that time whether to start abx for [facility]

## 2012-03-10 NOTE — Patient Instructions (Addendum)
Please continue your medications as you are taking them We will refill your albuterol Follow with Dr Delton Coombes in 3 months or sooner if you have any problems.

## 2012-03-10 NOTE — Assessment & Plan Note (Signed)
Bronchiectasis, allergies. If she worsens, best strategy would be to change her lisinopril

## 2012-03-10 NOTE — Assessment & Plan Note (Signed)
Continue symbicort, although this may be irritating her throat with no real benefits. Will continue for now although we could consider stopping in the future.

## 2012-03-17 ENCOUNTER — Ambulatory Visit (INDEPENDENT_AMBULATORY_CARE_PROVIDER_SITE_OTHER): Payer: Medicare Other | Admitting: Psychiatry

## 2012-03-17 DIAGNOSIS — F410 Panic disorder [episodic paroxysmal anxiety] without agoraphobia: Secondary | ICD-10-CM

## 2012-03-26 ENCOUNTER — Other Ambulatory Visit: Payer: Self-pay | Admitting: *Deleted

## 2012-03-26 NOTE — Telephone Encounter (Signed)
Pharmacy requesting 5mg  for pt medication but our office has 2.5mg  for pt lisinopril. Called pt and pt states she will call office back to inform with mg for her lisinopril.

## 2012-03-27 ENCOUNTER — Other Ambulatory Visit: Payer: Self-pay | Admitting: *Deleted

## 2012-03-27 MED ORDER — LISINOPRIL 5 MG PO TABS: 5.0000 mg | ORAL_TABLET | Freq: Every day | ORAL | Status: DC

## 2012-03-27 NOTE — Telephone Encounter (Signed)
Fax Received. Refill Completed. Sarah [name] (R.M.A)  Pt daughter will call office back to inform us if pt insurance will pay for 90 days supply for her lisinopril

## 2012-03-27 NOTE — Telephone Encounter (Signed)
Opened in Error.

## 2012-03-31 ENCOUNTER — Ambulatory Visit (INDEPENDENT_AMBULATORY_CARE_PROVIDER_SITE_OTHER): Payer: Medicare Other | Admitting: Psychiatry

## 2012-03-31 DIAGNOSIS — F410 Panic disorder [episodic paroxysmal anxiety] without agoraphobia: Secondary | ICD-10-CM | POA: Diagnosis not present

## 2012-04-12 DIAGNOSIS — R05 Cough: Secondary | ICD-10-CM | POA: Diagnosis not present

## 2012-04-15 ENCOUNTER — Emergency Department (HOSPITAL_COMMUNITY): Payer: Medicare Other

## 2012-04-15 ENCOUNTER — Observation Stay (HOSPITAL_COMMUNITY)
Admission: EM | Admit: 2012-04-15 | Discharge: 2012-04-16 | Disposition: A | Discharge status: Home / Self Care | Payer: Medicare Other | Attending: Family Medicine | Admitting: Family Medicine

## 2012-04-15 ENCOUNTER — Encounter (HOSPITAL_COMMUNITY): Payer: Self-pay | Admitting: Emergency Medicine

## 2012-04-15 DIAGNOSIS — J40 Bronchitis, not specified as acute or chronic: Secondary | ICD-10-CM | POA: Diagnosis not present

## 2012-04-15 DIAGNOSIS — R42 Dizziness and giddiness: Secondary | ICD-10-CM | POA: Diagnosis not present

## 2012-04-15 DIAGNOSIS — J479 Bronchiectasis, uncomplicated: Secondary | ICD-10-CM | POA: Insufficient documentation

## 2012-04-15 DIAGNOSIS — R059 Cough, unspecified: Secondary | ICD-10-CM | POA: Diagnosis not present

## 2012-04-15 DIAGNOSIS — R05 Cough: Secondary | ICD-10-CM | POA: Insufficient documentation

## 2012-04-15 DIAGNOSIS — J438 Other emphysema: Secondary | ICD-10-CM | POA: Diagnosis not present

## 2012-04-15 DIAGNOSIS — R110 Nausea: Secondary | ICD-10-CM | POA: Diagnosis not present

## 2012-04-15 DIAGNOSIS — F411 Generalized anxiety disorder: Secondary | ICD-10-CM | POA: Diagnosis not present

## 2012-04-15 DIAGNOSIS — R079 Chest pain, unspecified: Principal | ICD-10-CM | POA: Insufficient documentation

## 2012-04-15 DIAGNOSIS — IMO0001 Reserved for inherently not codable concepts without codable children: Secondary | ICD-10-CM | POA: Diagnosis present

## 2012-04-15 DIAGNOSIS — R0602 Shortness of breath: Secondary | ICD-10-CM | POA: Diagnosis not present

## 2012-04-15 DIAGNOSIS — E039 Hypothyroidism, unspecified: Secondary | ICD-10-CM

## 2012-04-15 DIAGNOSIS — I10 Essential (primary) hypertension: Secondary | ICD-10-CM | POA: Diagnosis not present

## 2012-04-15 DIAGNOSIS — F419 Anxiety disorder, unspecified: Secondary | ICD-10-CM | POA: Diagnosis present

## 2012-04-15 DIAGNOSIS — I252 Old myocardial infarction: Secondary | ICD-10-CM | POA: Diagnosis not present

## 2012-04-15 HISTORY — DX: Personal history of other infectious and parasitic diseases: Z86.19

## 2012-04-15 HISTORY — DX: Chest pain, unspecified: R07.9

## 2012-04-15 HISTORY — DX: Pure hypercholesterolemia, unspecified: E78.00

## 2012-04-15 HISTORY — DX: Anxiety disorder, unspecified: F41.9

## 2012-04-15 HISTORY — DX: Headache: R51

## 2012-04-15 HISTORY — DX: Gastro-esophageal reflux disease without esophagitis: K21.9

## 2012-04-15 HISTORY — DX: Headache, unspecified: R51.9

## 2012-04-15 HISTORY — DX: Pulmonary mycobacterial infection: A31.0

## 2012-04-15 HISTORY — DX: Personal history of other medical treatment: Z92.89

## 2012-04-15 HISTORY — DX: Migraine, unspecified, not intractable, without status migrainosus: G43.909

## 2012-04-15 LAB — BASIC METABOLIC PANEL
CO2: 26 mEq/L (ref 19–32)
Calcium: 8.7 mg/dL (ref 8.4–10.5)
Sodium: 136 mEq/L (ref 135–145)

## 2012-04-15 LAB — CBC WITH DIFFERENTIAL/PLATELET
Basophils Absolute: 0 10*3/uL (ref 0.0–0.1)
Eosinophils Relative: 0 % (ref 0–5)
Lymphocytes Relative: 4 % — ABNORMAL LOW (ref 12–46)
MCV: 82.1 fL (ref 78.0–100.0)
Neutro Abs: 11.6 10*3/uL — ABNORMAL HIGH (ref 1.7–7.7)
Neutrophils Relative %: 87 % — ABNORMAL HIGH (ref 43–77)
Platelets: 218 10*3/uL (ref 150–400)
RDW: 15.4 % (ref 11.5–15.5)
WBC: 13.4 10*3/uL — ABNORMAL HIGH (ref 4.0–10.5)

## 2012-04-15 LAB — TROPONIN I
Troponin I: <0.3 ng/mL (ref <0.30)
Troponin I: <0.3 ng/mL (ref <0.30)

## 2012-04-15 MED ORDER — DEXTROSE 5 % IV SOLN
1.0000 g | INTRAVENOUS | Status: DC
  Administered 2012-04-15: 1 g via INTRAVENOUS
  Filled 2012-04-15 (×2): qty 10

## 2012-04-15 MED ORDER — PREDNISONE 20 MG PO TABS
20.0000 mg | ORAL_TABLET | Freq: Every day | ORAL | Status: DC
  Filled 2012-04-15: qty 1

## 2012-04-15 MED ORDER — PREDNISONE 10 MG PO TABS: 10.0000 mg | ORAL_TABLET | Freq: Every day | ORAL | Status: DC

## 2012-04-15 MED ORDER — PROPRANOLOL HCL 10 MG PO TABS
10.0000 mg | ORAL_TABLET | Freq: Every day | ORAL | Status: DC | PRN
  Filled 2012-04-15: qty 1

## 2012-04-15 MED ORDER — PREDNISONE 20 MG PO TABS
40.0000 mg | ORAL_TABLET | Freq: Every day | ORAL | Status: AC
  Administered 2012-04-16: 40 mg via ORAL
  Filled 2012-04-15: qty 2

## 2012-04-15 MED ORDER — ALPRAZOLAM 0.25 MG PO TABS: 0.2500 mg | ORAL_TABLET | Freq: Two times a day (BID) | ORAL | Status: DC | PRN

## 2012-04-15 MED ORDER — SODIUM CHLORIDE 0.9 % IV SOLN: 250.0000 mL | INTRAVENOUS | Status: DC | PRN

## 2012-04-15 MED ORDER — DEXTROSE 5 % IV SOLN
500.0000 mg | INTRAVENOUS | Status: DC
  Administered 2012-04-15: 500 mg via INTRAVENOUS
  Filled 2012-04-15 (×2): qty 500

## 2012-04-15 MED ORDER — LEVOTHYROXINE SODIUM 50 MCG PO TABS
50.0000 ug | ORAL_TABLET | Freq: Every day | ORAL | Status: DC
  Administered 2012-04-16: 50 ug via ORAL
  Filled 2012-04-15 (×2): qty 1

## 2012-04-15 MED ORDER — HYDRALAZINE HCL 20 MG/ML IJ SOLN: 10.0000 mg | Freq: Four times a day (QID) | INTRAMUSCULAR | Status: DC | PRN

## 2012-04-15 MED ORDER — ACETAMINOPHEN 325 MG PO TABS
650.0000 mg | ORAL_TABLET | Freq: Four times a day (QID) | ORAL | Status: DC | PRN
  Administered 2012-04-16: 650 mg via ORAL
  Filled 2012-04-15: qty 2

## 2012-04-15 MED ORDER — ASPIRIN 325 MG PO TABS
325.0000 mg | ORAL_TABLET | Freq: Every day | ORAL | Status: DC
  Administered 2012-04-16: 325 mg via ORAL
  Filled 2012-04-15 (×2): qty 1

## 2012-04-15 MED ORDER — SIMVASTATIN 20 MG PO TABS
20.0000 mg | ORAL_TABLET | Freq: Every evening | ORAL | Status: DC
  Administered 2012-04-15: 20 mg via ORAL
  Filled 2012-04-15 (×2): qty 1

## 2012-04-15 MED ORDER — ASPIRIN BUFFERED 325 MG PO TABS: 325.0000 mg | ORAL_TABLET | Freq: Every day | ORAL | Status: DC

## 2012-04-15 MED ORDER — PREDNISONE 20 MG PO TABS: 20.0000 mg | ORAL_TABLET | Freq: Every day | ORAL | Status: DC

## 2012-04-15 MED ORDER — PANTOPRAZOLE SODIUM 40 MG PO TBEC
40.0000 mg | DELAYED_RELEASE_TABLET | Freq: Every day | ORAL | Status: DC
  Administered 2012-04-15 – 2012-04-16 (×2): 40 mg via ORAL
  Filled 2012-04-15 (×2): qty 1

## 2012-04-15 MED ORDER — ENOXAPARIN SODIUM 40 MG/0.4ML ~~LOC~~ SOLN
40.0000 mg | SUBCUTANEOUS | Status: DC
  Administered 2012-04-15: 40 mg via SUBCUTANEOUS
  Filled 2012-04-15 (×2): qty 0.4

## 2012-04-15 MED ORDER — ONDANSETRON HCL 4 MG/2ML IJ SOLN: 4.0000 mg | Freq: Four times a day (QID) | INTRAMUSCULAR | Status: DC | PRN

## 2012-04-15 MED ORDER — METOPROLOL TARTRATE 12.5 MG HALF TABLET
12.5000 mg | ORAL_TABLET | Freq: Two times a day (BID) | ORAL | Status: DC
  Administered 2012-04-15 – 2012-04-16 (×2): 12.5 mg via ORAL
  Filled 2012-04-15 (×3): qty 1

## 2012-04-15 MED ORDER — ONDANSETRON HCL 4 MG PO TABS: 4.0000 mg | ORAL_TABLET | Freq: Four times a day (QID) | ORAL | Status: DC | PRN

## 2012-04-15 MED ORDER — BUDESONIDE-FORMOTEROL FUMARATE 160-4.5 MCG/ACT IN AERO
2.0000 | INHALATION_SPRAY | Freq: Two times a day (BID) | RESPIRATORY_TRACT | Status: DC
  Administered 2012-04-15 – 2012-04-16 (×2): 2 via RESPIRATORY_TRACT
  Filled 2012-04-15: qty 6

## 2012-04-15 MED ORDER — SODIUM CHLORIDE 0.9 % IJ SOLN: 3.0000 mL | Freq: Two times a day (BID) | INTRAMUSCULAR | Status: DC

## 2012-04-15 MED ORDER — SODIUM CHLORIDE 0.9 % IJ SOLN: 3.0000 mL | INTRAMUSCULAR | Status: DC | PRN

## 2012-04-15 MED ORDER — ACETAMINOPHEN 650 MG RE SUPP: 650.0000 mg | Freq: Four times a day (QID) | RECTAL | Status: DC | PRN

## 2012-04-15 MED ORDER — CITALOPRAM HYDROBROMIDE 20 MG PO TABS
20.0000 mg | ORAL_TABLET | Freq: Every day | ORAL | Status: DC
  Administered 2012-04-16: 20 mg via ORAL
  Filled 2012-04-15 (×2): qty 1

## 2012-04-15 MED ORDER — ALBUTEROL SULFATE (5 MG/ML) 0.5% IN NEBU
2.5000 mg | INHALATION_SOLUTION | RESPIRATORY_TRACT | Status: DC | PRN
  Administered 2012-04-15: 2.5 mg via RESPIRATORY_TRACT
  Filled 2012-04-15: qty 0.5

## 2012-04-15 MED ORDER — SODIUM CHLORIDE 0.9 % IJ SOLN
3.0000 mL | Freq: Two times a day (BID) | INTRAMUSCULAR | Status: DC
  Administered 2012-04-16: 3 mL via INTRAVENOUS

## 2012-04-15 MED ORDER — ISOSORBIDE MONONITRATE ER 30 MG PO TB24
30.0000 mg | ORAL_TABLET | Freq: Every day | ORAL | Status: DC
  Administered 2012-04-16: 30 mg via ORAL
  Filled 2012-04-15 (×2): qty 1

## 2012-04-15 MED ORDER — FENTANYL CITRATE 0.05 MG/ML IJ SOLN
50.0000 ug | Freq: Once | INTRAMUSCULAR | Status: AC
  Administered 2012-04-15: 50 ug via INTRAVENOUS
  Filled 2012-04-15: qty 2

## 2012-04-15 MED ORDER — LISINOPRIL 5 MG PO TABS
5.0000 mg | ORAL_TABLET | Freq: Every day | ORAL | Status: DC
  Administered 2012-04-16: 5 mg via ORAL
  Filled 2012-04-15 (×2): qty 1

## 2012-04-15 MED ORDER — PREGABALIN 50 MG PO CAPS
50.0000 mg | ORAL_CAPSULE | Freq: Two times a day (BID) | ORAL | Status: DC
  Administered 2012-04-15 – 2012-04-16 (×2): 50 mg via ORAL
  Filled 2012-04-15 (×2): qty 1

## 2012-04-15 NOTE — ED Notes (Signed)
Per EMS - pt coming from home c/o CP that stared at 5am. 3/10 pain. Pt has hx of anxiety and took a xantax then went to lunch. After lunch the chest discomfort came back, she became SOB and then started to feel like she was going to pass out but didn't. Pt took 1 Nitro and 324 ASA prior to EMS arrival then became pain free. BP initial 170/100, second 153/84 HR 77 regular. 20G in left forearm.

## 2012-04-15 NOTE — H&P (Signed)
[facility] History and Physical  Sarah [name] WUJ:[id number] DOB: 1921-10-01 DOA: 04/15/2012  Referring physician: er PCP: Emeterio Reeve, MD  Specialists:   Chief Complaint: chest pain  HPI: Sarah [name] is a 79 y.o. female  Who had a cath 1 1/2 years ago.  She comes into the ER today with chest pain.  Started today after she ate lunch- has a known hiatal hernia.  She took a xanax and a nitro, she then felt dizzy.  She has a long history of lung problems, recently diagnosed with bronchitis and started on prednisone and cipro- no PPI coverage given.    Her work up in the ER was grossly unremarkable- we were asked to observe overnight.  BP was high in the ER 190s systolic.  She describes the chest pain as sharp- not sure it is worse with deep breathing, cough or palpation  Review of Systems: all systems reviewed, negative unless stated above    Past Medical History  Diagnosis Date  . Bronchiectasis     with history of Legionaires with chronic rales/rhonchi  . Pollen allergies   . Asthma   . Hypertension     Negative renal duplex in December of 2012  . Allergic rhinitis   . NSTEMI (non-ST elevated myocardial infarction)     a. Normal coronaries per cath August 2012 and negative CT angio for PE;  b. 06/2011 Repeat admission w/ chest pain and elevated troponin's, CTA Chest w/o PE  . Diastolic dysfunction     a. per echo 08/2010 with normal LV function;  b. 06/2011 Echo: EF 65-70%  . Idiopathic peripheral neuropathy   . Hypothyroidism   . Depression   . Shortness of breath   . Pneumonia     hx of pna  . H/O hiatal hernia   . Arthritis   . Osteopenia 02/2011    t score - 2.1   Past Surgical History  Procedure Laterality Date  . Dilation and curettage of uterus    . Total abdominal hysterectomy w/ bilateral salpingoophorectomy  1972    leiomyomata, menorrhagia  . Appendectomy    . Cardiac catheterization  August 2012    Normal coronaries.  . Tonsillectomy      Social History:  reports that she has never smoked. She has never used smokeless tobacco. She reports that  drinks alcohol. She reports that she does not use illicit drugs.   Allergies  Allergen Reactions  . Hydrocodone Nausea And Vomiting and Other (See Comments)    Passed out, threw up, blood pressure dropped  . Levofloxacin Other (See Comments)    Pass out, vomiting  . Oxycodone Other (See Comments)    Rapid heart beat  . Clarithromycin Other (See Comments)    Vomiting    Family History  Problem Relation Age of Onset  . Hypertension Mother     stroke  . Cancer Brother     prostrate    Prior to Admission medications   Medication Sig Start Date End Date Taking? Authorizing Provider  acetaminophen-codeine (TYLENOL #3) 300-30 MG per tablet Take 1 tablet by mouth 2 (two) times daily. For pain 12/01/10  Yes Historical Provider, MD  albuterol (PROVENTIL HFA;VENTOLIN HFA) 108 (90 BASE) MCG/ACT inhaler Inhale 2 puffs into the lungs every 6 (six) hours as needed. For shortness of breath 03/10/12  Yes Leslye Peer, MD  ALPRAZolam Prudy Feeler) 0.25 MG tablet Take 0.25 mg by mouth 2 (two) times daily. 11/24/11  Yes Laveda Norman, MD  aspirin 325 MG buffered tablet Take 325 mg by mouth daily as needed for pain. For headache   Yes Historical Provider, MD  aspirin EC 81 MG tablet Take 81 mg by mouth daily.   Yes Historical Provider, MD  budesonide-formoterol (SYMBICORT) 160-4.5 MCG/ACT inhaler Inhale 2 puffs into the lungs 2 (two) times daily. 08/30/11 08/29/12 Yes Leslye Peer, MD  ciprofloxacin (CIPRO) 500 MG tablet Take 500 mg by mouth 2 (two) times daily.   Yes Historical Provider, MD  citalopram (CELEXA) 40 MG tablet Take 20 mg by mouth daily.    Yes Historical Provider, MD  estradiol (VIVELLE-DOT) 0.05 MG/24HR Place 1 patch (0.05 mg total) onto the skin once a week. Apply on Thursday 08/27/11 08/26/12 Yes Dara Lords, MD  isosorbide mononitrate (IMDUR) 30 MG 24 hr tablet Take 1 tablet  (30 mg total) by mouth daily. 07/22/11 07/21/12 Yes Rosalio Macadamia, NP  levothyroxine (SYNTHROID, LEVOTHROID) 50 MCG tablet Take 50 mcg by mouth daily.     Yes Historical Provider, MD  lisinopril (PRINIVIL,ZESTRIL) 5 MG tablet Take 1 tablet (5 mg total) by mouth daily. 03/27/12  Yes Vesta Mixer, MD  metoprolol tartrate (LOPRESSOR) 25 MG tablet TAKE 1/2 TABLET BY MOUTH TWICE DAILY 01/07/12  Yes Vesta Mixer, MD  nitroGLYCERIN (NITROSTAT) 0.4 MG SL tablet Place 0.4 mg under the tongue every 5 (five) minutes as needed. For chest pain 06/20/11 06/19/12 Yes Ok Anis, NP  predniSONE (DELTASONE) 20 MG tablet Take 20 mg by mouth daily. Started medication on Monday 04-13-12. Take 3 tablets daily for 2 days, 2 days , 2 tablets daily for 2 days, 1 tablet daily for 2 days, Then 1/2 tablet daily for 2 days. Take with food   Yes Historical Provider, MD  pregabalin (LYRICA) 50 MG capsule Take 50 mg by mouth 2 (two) times daily.    Yes Historical Provider, MD  propranolol (INDERAL) 10 MG tablet Take 10 mg by mouth daily as needed. As needed for palpitations 12/16/11  Yes Vesta Mixer, MD  simvastatin (ZOCOR) 20 MG tablet Take 1 tablet (20 mg total) by mouth every evening. 07/24/11 07/23/12 Yes Vesta Mixer, MD   Physical Exam: Filed Vitals:   04/15/12 1531 04/15/12 1615 04/15/12 1630 04/15/12 1715  BP: 188/94 163/142 176/86 175/86  Pulse: 67 64 65 70  Temp: 98 F (36.7 C)     TempSrc: Oral     Resp: 16 27 23 21   SpO2: 95% 97% 96% 97%     General:  Anxious- cooperative, NAD  Eyes: wnl  ENT: wnl  Neck: supple  Cardiovascular: rrr  Respiratory: coarse breath sounds "velcro"- mild exp wheezing b/l  Abdomen: +BS, soft, NT  Skin: senile changes  Musculoskeletal: moves all 4 extremitites  Psychiatric: anxious  Neurologic: no focal deficits  Labs on Admission:  Basic Metabolic Panel:  Recent Labs Lab 04/15/12 1611  NA 136  K 4.6  CL 102  CO2 26  GLUCOSE 120*  BUN 18   CREATININE 0.76  CALCIUM 8.7   Liver Function Tests: No results found for this basename: AST, ALT, ALKPHOS, BILITOT, PROT, ALBUMIN,  in the last 168 hours No results found for this basename: LIPASE, AMYLASE,  in the last 168 hours No results found for this basename: AMMONIA,  in the last 168 hours CBC:  Recent Labs Lab 04/15/12 1611  WBC 13.4*  NEUTROABS 11.6*  HGB 10.5*  HCT 32.6*  MCV 82.1  PLT 218  Cardiac Enzymes:  Recent Labs Lab 04/15/12 1613  TROPONINI <0.30    BNP (last 3 results) No results found for this basename: PROBNP,  in the last 8760 hours CBG: No results found for this basename: GLUCAP,  in the last 168 hours  Radiological Exams on Admission: Dg Chest 2 View  04/15/2012  *RADIOLOGY REPORT*  Clinical Data: Chest pain.  Bronchitis.  CHEST - 2 VIEW  Comparison: Two-view chest 04/12/2012.  Findings: The heart is enlarged.  There is no edema or effusion to suggest failure.  Chronic interstitial coarsening is again noted. A small hiatal hernia is evident.  Exaggerated kyphosis is present. Mild emphysematous changes are noted.  IMPRESSION:  1.  Stable cardiomegaly without failure. 2.  Stable chronic interstitial coarsening. 3.  Small hiatal hernia.   Original Report Authenticated By: Marin Roberts, M.D.     EKG: Independently reviewed. NSR  Assessment/Plan Active Problems:   HYPERTENSION   BRONCHIECTASIS   Cough   Shortness of breath dyspnea   Anxiety   Chest pain   1. Plan to admit to observation, cycle CE.  Recent cath so doubt cardiac in origin.  She is on a new steroid and abx so could be gastritis with known hiatal hernia.  Start PPI.  Will continue steroid but change to IV abx for bronchitis- add nebulizers.   2. HTN- add PRN and continue home medications- ? Related to steroids    Code Status: full Family Communication: daughter at bedside Disposition Plan: observe  Time spent: 33  Marlin Canary [facility] Pager  [phone]  If 7PM-7AM, please contact night-coverage [email or web address] Password [facility] 04/15/2012, 6:31 PM

## 2012-04-15 NOTE — ED Notes (Signed)
Pt ambulated to restroom with no issues.  

## 2012-04-15 NOTE — ED Provider Notes (Signed)
History     CSN: [id number]  Arrival date & time 04/15/12  1516   First MD Initiated Contact with Patient 04/15/12 1518      Chief Complaint  Patient presents with  . Chest Pain    (Consider location/radiation/quality/duration/timing/severity/associated sxs/prior treatment) Patient is a 79 y.o. female presenting with chest pain.  Chest Pain  Pt with history of NSTEMI with normal coronary arteries reports she has had productive cough for the last several days, diagnosed as bronchitis at [facility] 3 days ago. She was eating lunch this afternoon, when she began to have chest pain, SOB, lightheaded and dizziness. She took a NTG and a Xanax but with improvement in CP but continued to feel dizzy. States she felt she was going to pass out and so she called EMS. She is pain free at the time of my eval.   Past Medical History  Diagnosis Date  . Bronchiectasis     with history of Legionaires with chronic rales/rhonchi  . Pollen allergies   . Asthma   . Hypertension     Negative renal duplex in December of 2012  . Allergic rhinitis   . NSTEMI (non-ST elevated myocardial infarction)     a. Normal coronaries per cath August 2012 and negative CT angio for PE;  b. 06/2011 Repeat admission w/ chest pain and elevated troponin's, CTA Chest w/o PE  . Diastolic dysfunction     a. per echo 08/2010 with normal LV function;  b. 06/2011 Echo: EF 65-70%  . Idiopathic peripheral neuropathy   . Hypothyroidism   . Depression   . Shortness of breath   . Pneumonia     hx of pna  . H/O hiatal hernia   . Arthritis   . Osteopenia 02/2011    t score - 2.1    Past Surgical History  Procedure Laterality Date  . Dilation and curettage of uterus    . Total abdominal hysterectomy w/ bilateral salpingoophorectomy  1972    leiomyomata, menorrhagia  . Appendectomy    . Cardiac catheterization  August 2012    Normal coronaries.  . Tonsillectomy      Family History  Problem Relation Age of Onset  .  Hypertension Mother     stroke  . Cancer Brother     prostrate    History  Substance Use Topics  . Smoking status: Never Smoker   . Smokeless tobacco: Never Used  . Alcohol Use: Yes     Comment: wine 1-2 week    OB History   Grav Para Term Preterm Abortions TAB SAB Ect Mult Living   2 2        2       Review of Systems  Cardiovascular: Positive for chest pain.   All other systems reviewed and are negative except as noted in HPI.   Allergies  Hydrocodone; Levofloxacin; Oxycodone; and Clarithromycin  Home Medications   Current Outpatient Rx  Name  Route  Sig  Dispense  Refill  . acetaminophen-codeine (TYLENOL #3) 300-30 MG per tablet   Oral   Take 1 tablet by mouth every 4 (four) hours as needed. For pain         . albuterol (PROVENTIL HFA;VENTOLIN HFA) 108 (90 BASE) MCG/ACT inhaler   Inhalation   Inhale 2 puffs into the lungs every 6 (six) hours as needed. For shortness of breath   1 Inhaler   3   . ALPRAZolam (XANAX) 0.25 MG tablet   Oral  Take 1 tablet (0.25 mg total) by mouth 3 (three) times daily.   90 tablet   0   . aspirin EC 81 MG tablet   Oral   Take 81 mg by mouth daily.         . budesonide-formoterol (SYMBICORT) 160-4.5 MCG/ACT inhaler   Inhalation   Inhale 2 puffs into the lungs 2 (two) times daily.   1 Inhaler   12   . citalopram (CELEXA) 40 MG tablet   Oral   Take 20 mg by mouth daily.          Marland Kitchen estradiol (VIVELLE-DOT) 0.05 MG/24HR   Transdermal   Place 1 patch (0.05 mg total) onto the skin once a week. Apply on Thursday   8 patch   11   . isosorbide mononitrate (IMDUR) 30 MG 24 hr tablet   Oral   Take 1 tablet (30 mg total) by mouth daily.   30 tablet   11   . levothyroxine (SYNTHROID, LEVOTHROID) 50 MCG tablet   Oral   Take 50 mcg by mouth daily.           Marland Kitchen lisinopril (PRINIVIL,ZESTRIL) 5 MG tablet   Oral   Take 1 tablet (5 mg total) by mouth daily.   30 tablet   5   . metoprolol tartrate (LOPRESSOR) 25 MG  tablet      TAKE 1/2 TABLET BY MOUTH TWICE DAILY   60 tablet   11   . montelukast (SINGULAIR) 10 MG tablet   Oral   Take 10 mg by mouth daily as needed. For allergies         . nitroGLYCERIN (NITROSTAT) 0.4 MG SL tablet   Sublingual   Place 0.4 mg under the tongue every 5 (five) minutes as needed. For chest pain         . pregabalin (LYRICA) 50 MG capsule   Oral   Take 50 mg by mouth 2 (two) times daily.          . propranolol (INDERAL) 10 MG tablet   Oral   Take 1 tablet (10 mg total) by mouth 4 (four) times daily. As needed for palpitations   30 tablet   6   . simvastatin (ZOCOR) 20 MG tablet   Oral   Take 1 tablet (20 mg total) by mouth every evening.   30 tablet   5     BP 188/94  Pulse 67  Temp(Src) 98 F (36.7 C) (Oral)  Resp 16  SpO2 95%  LMP 01/14/1970  Physical Exam  Nursing note and vitals reviewed. Constitutional: She is oriented to person, place, and time. She appears well-developed and well-nourished.  HENT:  Head: Normocephalic and atraumatic.  Eyes: EOM are normal. Pupils are equal, round, and reactive to light.  Neck: Normal range of motion. Neck supple.  Cardiovascular: Normal rate, normal heart sounds and intact distal pulses.   Pulmonary/Chest: Effort normal and breath sounds normal.  Abdominal: Bowel sounds are normal. She exhibits no distension. There is no tenderness.  Musculoskeletal: Normal range of motion. She exhibits no edema and no tenderness.  Neurological: She is alert and oriented to person, place, and time. She has normal strength. No cranial nerve deficit or sensory deficit.  Skin: Skin is warm and dry. No rash noted.  Psychiatric: She has a normal mood and affect.    ED Course  Procedures (including critical care time)  Labs Reviewed  CBC WITH DIFFERENTIAL - Abnormal; Notable for the following:  WBC 13.4 (*)    Hemoglobin 10.5 (*)    HCT 32.6 (*)    Neutrophils Relative 87 (*)    Neutro Abs 11.6 (*)     Lymphocytes Relative 4 (*)    Lymphs Abs 0.6 (*)    Monocytes Absolute 1.2 (*)    All other components within normal limits  BASIC METABOLIC PANEL - Abnormal; Notable for the following:    Glucose, Bld 120 (*)    GFR calc non Af Amer 73 (*)    GFR calc Af Amer 84 (*)    All other components within normal limits  BASIC METABOLIC PANEL - Abnormal; Notable for the following:    Calcium 8.3 (*)    GFR calc non Af Amer 72 (*)    GFR calc Af Amer 83 (*)    All other components within normal limits  CBC - Abnormal; Notable for the following:    WBC 11.2 (*)    Hemoglobin 10.6 (*)    HCT 33.5 (*)    All other components within normal limits  TROPONIN I  TROPONIN I  TROPONIN I  TROPONIN I   Dg Chest 2 View  04/15/2012  *RADIOLOGY REPORT*  Clinical Data: Chest pain.  Bronchitis.  CHEST - 2 VIEW  Comparison: Two-view chest 04/12/2012.  Findings: The heart is enlarged.  There is no edema or effusion to suggest failure.  Chronic interstitial coarsening is again noted. A small hiatal hernia is evident.  Exaggerated kyphosis is present. Mild emphysematous changes are noted.  IMPRESSION:  1.  Stable cardiomegaly without failure. 2.  Stable chronic interstitial coarsening. 3.  Small hiatal hernia.   Original Report Authenticated By: Marin Roberts, M.D.      1. Chest pain       MDM   Date: 04/15/2012  Rate: 67  Rhythm: normal sinus rhythm  QRS Axis: normal  Intervals: normal  ST/T Wave abnormalities: normal  Conduction Disutrbances: none  Narrative Interpretation: unremarkable    Labs and imaging unremarkable. Discussed admission vs close outpatient followup. Pt and daughter more comfortable with rule out.          Charles B. Bernette Mayers, MD 04/16/12 1007

## 2012-04-15 NOTE — ED Notes (Signed)
Pt denies CP at this time. Pt sts that she isn't feeling clammy or like she is going to pass out anymore. Pt sts she does feel a little weaker than normal.

## 2012-04-16 ENCOUNTER — Ambulatory Visit: Payer: Medicare Other | Admitting: Psychiatry

## 2012-04-16 DIAGNOSIS — R079 Chest pain, unspecified: Secondary | ICD-10-CM

## 2012-04-16 DIAGNOSIS — I10 Essential (primary) hypertension: Secondary | ICD-10-CM

## 2012-04-16 DIAGNOSIS — E039 Hypothyroidism, unspecified: Secondary | ICD-10-CM | POA: Diagnosis not present

## 2012-04-16 DIAGNOSIS — J479 Bronchiectasis, uncomplicated: Secondary | ICD-10-CM

## 2012-04-16 LAB — BASIC METABOLIC PANEL
BUN: 14 mg/dL (ref 6–23)
Chloride: 101 mEq/L (ref 96–112)
Creatinine, Ser: 0.78 mg/dL (ref 0.50–1.10)
GFR calc Af Amer: 83 mL/min — ABNORMAL LOW (ref >90)
GFR calc non Af Amer: 72 mL/min — ABNORMAL LOW (ref >90)
Potassium: 3.8 mEq/L (ref 3.5–5.1)

## 2012-04-16 LAB — CBC
HCT: 33.5 % — ABNORMAL LOW (ref 36.0–46.0)
Hemoglobin: 10.6 g/dL — ABNORMAL LOW (ref 12.0–15.0)
MCHC: 31.6 g/dL (ref 30.0–36.0)
RDW: 15.3 % (ref 11.5–15.5)
WBC: 11.2 10*3/uL — ABNORMAL HIGH (ref 4.0–10.5)

## 2012-04-16 LAB — TROPONIN I
Troponin I: <0.3 ng/mL (ref <0.30)
Troponin I: <0.3 ng/mL (ref <0.30)

## 2012-04-16 MED ORDER — PANTOPRAZOLE SODIUM 40 MG PO TBEC: 40.0000 mg | DELAYED_RELEASE_TABLET | Freq: Every day | ORAL | Status: DC

## 2012-04-16 MED ORDER — AMOXICILLIN 875 MG PO TABS: 875.0000 mg | ORAL_TABLET | Freq: Two times a day (BID) | ORAL | Status: DC

## 2012-04-16 MED ORDER — AZITHROMYCIN 500 MG PO TABS: 500.0000 mg | ORAL_TABLET | Freq: Every day | ORAL | Status: DC

## 2012-04-16 NOTE — Progress Notes (Signed)
DC orders received.  Patient stable with no S/S of distress.  Medication and discharge information reviewed with patient and patient's family.  Patient DC home. Sarah [name]  

## 2012-04-16 NOTE — Progress Notes (Signed)
Utilization review completed.  

## 2012-04-16 NOTE — Discharge Summary (Signed)
Physician Discharge Summary  Sarah [name] ZOX:[id number] DOB: 1922-01-09 DOA: 04/15/2012  PCP: Emeterio Reeve, MD  Admit date: 04/15/2012 Discharge date: 04/16/2012  Time spent: > 35 minutes  Recommendations for Outpatient Follow-up:  1. Please be sure to follow up with your primary care physician in 1-2 weeks or sooner.  Discharge Diagnoses:  Active Problems:   HYPERTENSION   BRONCHIECTASIS   Cough   Shortness of breath dyspnea   Anxiety   Chest pain   Discharge Condition: stable  Diet recommendation: Cardiac diet  Filed Weights   04/16/12 0531  Weight: 59.784 kg (131 lb 12.8 oz)    History of present illness:  Please refer to HPI for further details  79 y/o with h/o hiatal hernia and recent diagnosis of bronchitis as outpatient started on prednisone and antibiotics.  Presented to the Ed complaining of chest pain.  Hospital Course:  Chest pain - Cardiac enzymes x 3 negative - Cath 1.5 yrs ago which reportedly was negative - Most likely related to GERD vs gastritis given h/o hiatal hernia and recent prednisone administration improved in house on protonix.  As such will d/c with protonix on board.  HTN - Plan on continuing home regimen on discharge.  Bronchitis - Will continue home prednisone regimen - Will discharge on amoxicillin and azithromycin - Pt to f/u with pcp in 1-2 weeks - treat for 7 days total.   Procedures:  none  Consultations:  none  Discharge Exam: Filed Vitals:   04/15/12 1830 04/15/12 1951 04/16/12 0531 04/16/12 0929  BP: 169/86 178/85 144/65 159/83  Pulse: 68 72 73 76  Temp:  98.1 F (36.7 C) 98.8 F (37.1 C)   TempSrc:  Oral Oral   Resp: 24 22 20    Weight:   59.784 kg (131 lb 12.8 oz)   SpO2: 95% 93% 94%     General: Pt in NAD, Alert and Awake Cardiovascular: RRR, No MRG Respiratory: no wheezes, no increased work of breathing, breath sounds BL  Discharge Instructions  Discharge Orders   Future Appointments Provider  Department Dept Phone   06/03/2012 11:30 AM Leslye Peer, MD [facility] [phone]   06/11/2012 10:45 AM Vesta Mixer, MD [facility] Main Office [place]) [phone]   Future Orders Complete By Expires     Call MD for:  difficulty breathing, headache or visual disturbances  As directed     Call MD for:  temperature >100.4  As directed     Diet - low sodium heart healthy  As directed     Discharge instructions  As directed     Comments:      Please be sure to follow up with your primary care physician in 1-2 weeks or sooner should any new concerns arise.    Increase activity slowly  As directed         Medication List    STOP taking these medications       ciprofloxacin 500 MG tablet  Commonly known as:  CIPRO      TAKE these medications       acetaminophen-codeine 300-30 MG per tablet  Commonly known as:  TYLENOL #3  Take 1 tablet by mouth 2 (two) times daily. For pain     albuterol 108 (90 BASE) MCG/ACT inhaler  Commonly known as:  PROVENTIL HFA;VENTOLIN HFA  Inhale 2 puffs into the lungs every 6 (six) hours as needed. For shortness of breath     ALPRAZolam 0.25 MG tablet  Commonly known  as:  XANAX  Take 0.25 mg by mouth 2 (two) times daily.     amoxicillin 875 MG tablet  Commonly known as:  AMOXIL  Take 1 tablet (875 mg total) by mouth 2 (two) times daily.     aspirin 325 MG buffered tablet  Take 325 mg by mouth daily as needed for pain. For headache     aspirin EC 81 MG tablet  Take 81 mg by mouth daily.     azithromycin 500 MG tablet  Commonly known as:  ZITHROMAX  Take 1 tablet (500 mg total) by mouth daily.     budesonide-formoterol 160-4.5 MCG/ACT inhaler  Commonly known as:  SYMBICORT  Inhale 2 puffs into the lungs 2 (two) times daily.     citalopram 40 MG tablet  Commonly known as:  CELEXA  Take 20 mg by mouth daily.     estradiol 0.05 MG/24HR  Commonly known as:  VIVELLE-DOT  Place 1 patch (0.05 mg total) onto the skin  once a week. Apply on Thursday     isosorbide mononitrate 30 MG 24 hr tablet  Commonly known as:  IMDUR  Take 1 tablet (30 mg total) by mouth daily.     levothyroxine 50 MCG tablet  Commonly known as:  SYNTHROID, LEVOTHROID  Take 50 mcg by mouth daily.     lisinopril 5 MG tablet  Commonly known as:  PRINIVIL,ZESTRIL  Take 1 tablet (5 mg total) by mouth daily.     metoprolol tartrate 25 MG tablet  Commonly known as:  LOPRESSOR  TAKE 1/2 TABLET BY MOUTH TWICE DAILY     nitroGLYCERIN 0.4 MG SL tablet  Commonly known as:  NITROSTAT  Place 0.4 mg under the tongue every 5 (five) minutes as needed. For chest pain     pantoprazole 40 MG tablet  Commonly known as:  PROTONIX  Take 1 tablet (40 mg total) by mouth daily.     predniSONE 20 MG tablet  Commonly known as:  DELTASONE  Take 20 mg by mouth daily. Started medication on Monday 04-13-12. Take 3 tablets daily for 2 days, 2 days , 2 tablets daily for 2 days, 1 tablet daily for 2 days, Then 1/2 tablet daily for 2 days. Take with food     pregabalin 50 MG capsule  Commonly known as:  LYRICA  Take 50 mg by mouth 2 (two) times daily.     propranolol 10 MG tablet  Commonly known as:  INDERAL  Take 10 mg by mouth daily as needed. As needed for palpitations     simvastatin 20 MG tablet  Commonly known as:  ZOCOR  Take 1 tablet (20 mg total) by mouth every evening.          The results of significant diagnostics from this hospitalization (including imaging, microbiology, ancillary and laboratory) are listed below for reference.    Significant Diagnostic Studies: Dg Chest 2 View  04/15/2012  *RADIOLOGY REPORT*  Clinical Data: Chest pain.  Bronchitis.  CHEST - 2 VIEW  Comparison: Two-view chest 04/12/2012.  Findings: The heart is enlarged.  There is no edema or effusion to suggest failure.  Chronic interstitial coarsening is again noted. A small hiatal hernia is evident.  Exaggerated kyphosis is present. Mild emphysematous changes  are noted.  IMPRESSION:  1.  Stable cardiomegaly without failure. 2.  Stable chronic interstitial coarsening. 3.  Small hiatal hernia.   Original Report Authenticated By: Marin Roberts, M.D.     Microbiology: No results found  for this or any previous visit (from the past 240 hour(s)).   Labs: Basic Metabolic Panel:  Recent Labs Lab 04/15/12 1611 04/16/12 0211  NA 136 137  K 4.6 3.8  CL 102 101  CO2 26 28  GLUCOSE 120* 94  BUN 18 14  CREATININE 0.76 0.78  CALCIUM 8.7 8.3*   Liver Function Tests: No results found for this basename: AST, ALT, ALKPHOS, BILITOT, PROT, ALBUMIN,  in the last 168 hours No results found for this basename: LIPASE, AMYLASE,  in the last 168 hours No results found for this basename: AMMONIA,  in the last 168 hours CBC:  Recent Labs Lab 04/15/12 1611 04/16/12 0211  WBC 13.4* 11.2*  NEUTROABS 11.6*  --   HGB 10.5* 10.6*  HCT 32.6* 33.5*  MCV 82.1 82.7  PLT 218 208   Cardiac Enzymes:  Recent Labs Lab 04/15/12 1613 04/15/12 1951 04/16/12 0211 04/16/12 0742  TROPONINI <0.30 <0.30 <0.30 <0.30   BNP: BNP (last 3 results) No results found for this basename: PROBNP,  in the last 8760 hours CBG: No results found for this basename: GLUCAP,  in the last 168 hours     Signed:  Penny Pia  [facility] 04/16/2012, 12:23 PM

## 2012-04-20 DIAGNOSIS — J479 Bronchiectasis, uncomplicated: Secondary | ICD-10-CM | POA: Diagnosis not present

## 2012-04-20 DIAGNOSIS — K449 Diaphragmatic hernia without obstruction or gangrene: Secondary | ICD-10-CM | POA: Diagnosis not present

## 2012-04-20 DIAGNOSIS — R079 Chest pain, unspecified: Secondary | ICD-10-CM | POA: Diagnosis not present

## 2012-04-20 DIAGNOSIS — B370 Candidal stomatitis: Secondary | ICD-10-CM | POA: Diagnosis not present

## 2012-04-22 ENCOUNTER — Ambulatory Visit (INDEPENDENT_AMBULATORY_CARE_PROVIDER_SITE_OTHER): Payer: Medicare Other | Admitting: Psychiatry

## 2012-04-22 DIAGNOSIS — F410 Panic disorder [episodic paroxysmal anxiety] without agoraphobia: Secondary | ICD-10-CM | POA: Diagnosis not present

## 2012-05-01 ENCOUNTER — Other Ambulatory Visit: Payer: Self-pay | Admitting: *Deleted

## 2012-05-01 MED ORDER — SIMVASTATIN 20 MG PO TABS: 20.0000 mg | ORAL_TABLET | Freq: Every evening | ORAL | Status: DC

## 2012-05-01 NOTE — Telephone Encounter (Signed)
Fax Received. Refill Completed. Sarah [name] (R.M.A)   

## 2012-05-20 ENCOUNTER — Ambulatory Visit (INDEPENDENT_AMBULATORY_CARE_PROVIDER_SITE_OTHER): Payer: Medicare Other | Admitting: Psychiatry

## 2012-05-20 DIAGNOSIS — F410 Panic disorder [episodic paroxysmal anxiety] without agoraphobia: Secondary | ICD-10-CM | POA: Diagnosis not present

## 2012-05-29 DIAGNOSIS — H905 Unspecified sensorineural hearing loss: Secondary | ICD-10-CM | POA: Diagnosis not present

## 2012-06-03 ENCOUNTER — Ambulatory Visit (INDEPENDENT_AMBULATORY_CARE_PROVIDER_SITE_OTHER): Payer: Medicare Other | Admitting: Psychiatry

## 2012-06-03 ENCOUNTER — Encounter: Payer: Self-pay | Admitting: Emergency Medicine

## 2012-06-03 ENCOUNTER — Ambulatory Visit (INDEPENDENT_AMBULATORY_CARE_PROVIDER_SITE_OTHER): Payer: Medicare Other | Admitting: Emergency Medicine

## 2012-06-03 VITALS — BP 139/90 | HR 59 | Temp 98.0°F | Ht 66.0 in | Wt 135.2 lb

## 2012-06-03 DIAGNOSIS — F410 Panic disorder [episodic paroxysmal anxiety] without agoraphobia: Secondary | ICD-10-CM

## 2012-06-03 DIAGNOSIS — J479 Bronchiectasis, uncomplicated: Secondary | ICD-10-CM | POA: Diagnosis not present

## 2012-06-03 NOTE — Patient Instructions (Addendum)
We will try stopping your symbicort to see if this changes your breathing or your coughing (to see if you miss it) Keep your Proventil available to use 2 puffs if needed for shortness of breath.  Follow with Dr Delton Coombes in 6 weeks or sooner if you have any problems

## 2012-06-03 NOTE — Progress Notes (Signed)
79 yo never smoker, hx remote Legionella PNA, diagnosed with suspected asthma about 10 years ago. PFT's 7/10 with mixed disease, mild AFL. Formerly treated with Advair and albuterol, stopped when she moved to [place] in 2008. Stayed on Singulair. At original consult we d/c'd ACE-I. Started on Symbicort.   ROV 11/11/11 --  hx bronchiectasis, chronic coughing, CAD. CT scan consistent w MAIC. We have discussed empiric MAIC therapy, but decided to defer for now. She tells me that she was treated for bronchitis recently with abx + pred. Last time we added symbicort, she feels that it is helping her >> her cough is better.   ROV 03/10/12 -- hx bronchiectasis, chronic coughing, CAD. CT scan consistent w MAIC. She has had trouble since last visit, she has been dealing w panic attacks - some better on xanax. She has had some scratchy throat, hoarse voice. Her cough has overall been better. She remains on symbicort, uses albuterol 1-2x a day. Feels "bad" sometimes, weak, fatigued. ? Whether this relates to her hx MAIC. Remains on lisinopril.  ROV 06/03/12 -- bronchiectasis, chronic coughing, CAD. CT scan consistent w MAIC. She was admitted beginning of April for bronchitis and then severe GERD and CP (? Due to the prednisone). Currently sx are minimal. Very little cough. She stopped symbicort temporarily due to thrush - back on it now.    Filed Vitals:   06/03/12 1136  BP: 139/90  Pulse: 59  Temp: 98 F (36.7 C)   Gen: Pleasant, thin, in no distress, normal affect  ENT: No lesions,  mouth clear,  oropharynx clear, no postnasal drip  Neck: No JVD, no TMG, no carotid bruits  Lungs: No use of accessory muscles, no dullness to percussion, few scattered insp crackles L>>R  Cardiovascular: RRR, heart sounds normal, no murmur or gallops, no peripheral edema  Musculoskeletal: No deformities, no cyanosis or clubbing  Neuro: alert, non focal  Skin: Warm, no lesions or rashes  03/10/12 --  Comparison:  11/23/2011.  Findings: Mediastinal lymph nodes measure up to 12 mm in the  precarinal station, as before. Hilar regions are difficult to  definitively evaluate without IV contrast. No axillary adenopathy.  Heart is mildly enlarged. No pericardial effusion. Moderate  hiatal hernia.  Trace amount of loculated pleural fluid is seen in the posterior  medial inferior left hemithorax. Mild biapical pleural parenchymal  scarring. Interval progression of bibasilar predominant  peribronchovascular nodularity, areas of airspace consolidation and  scattered bronchiectasis. Airway is otherwise unremarkable.  Incidental imaging of the upper abdomen shows no acute findings.  No worrisome lytic or sclerotic lesions. Degenerative changes are  seen in the spine.  IMPRESSION:  1. Interval progression of patchy bibasilar predominant  peribronchovascular nodularity, consolidation and bronchiectasis.  Findings are suggestive of mycobacterium avium complex (MAC).  2. Tiny loculated left pleural fluid collection.     BRONCHIECTASIS Stable at this time, likely colonized w [facility].  - we discussed possibly stopping symbicort, she would like to try this, see if she tolerates.  - she has a lot of trouble tolerating abx, no plan to treat [facility] unless she worsens significantly - albuterol prn - rov 4-6 weeks

## 2012-06-03 NOTE — Assessment & Plan Note (Addendum)
Stable at this time, likely colonized w [facility].  - we discussed possibly stopping symbicort, she would like to try this, see if she tolerates.  - she has a lot of trouble tolerating abx, no plan to treat [facility] unless she worsens significantly - albuterol prn - rov 4-6 weeks

## 2012-06-11 ENCOUNTER — Ambulatory Visit (INDEPENDENT_AMBULATORY_CARE_PROVIDER_SITE_OTHER): Payer: Medicare Other | Admitting: Cardiovascular Disease

## 2012-06-11 ENCOUNTER — Encounter: Payer: Self-pay | Admitting: Cardiovascular Disease

## 2012-06-11 VITALS — BP 129/73 | HR 63 | Ht 66.0 in | Wt 133.0 lb

## 2012-06-11 DIAGNOSIS — R079 Chest pain, unspecified: Secondary | ICD-10-CM

## 2012-06-11 DIAGNOSIS — I10 Essential (primary) hypertension: Secondary | ICD-10-CM

## 2012-06-11 NOTE — Progress Notes (Signed)
Sarah [name] Date of Birth: 1921/03/17 Medical Record #[id number]  Problem list: 1. Hypertension 2. Asthma 3. Depression 4. Coronary artery disease-status post non-ST segment elevation myocardial infarction. Cardiac cath revealed nonobstructive coronary artery disease 5. Bronchiectasis / atypical mycobacterium infection   History of Present Illness: Sarah [name] is seen back today following a recent hospitalization. She was admitted to the hospital with chest tightness. She ruled out for myocardial infarction. She has a history of chest pain in the past and a cardiac catheterization in 2012 was unremarkable.  She originally called EMS when her BP was 220/110  She has had significant difficulty controlling her BP recently.   She has been watching her salt.  She has taken amlodipine and Lisinopril in the past but these have been stopped because she seemed to be overmedicated.    She continues to have intermittent episodes of chest pressure and heaviness.  These are typically associated with marked hypertension although it is not clear whether the hypertension occurs first or as a result of her  feeling poorly.  Jun 11, 2012:  Sarah [name] was seen by Norma Fredrickson in February, 2014. She had been measuring her blood pressure at least 10 times every day and was worried because her blood pressure seemed to be somewhat labile.  She feels poorly today.  She did not sleep well last night.  She had a headache.   She has lots of anxiety issues.  She takes xanax with some relief.     Current Outpatient Prescriptions on File Prior to Visit  Medication Sig Dispense Refill  . isosorbide mononitrate (IMDUR) 30 MG 24 hr tablet Take 1 tablet (30 mg total) by mouth daily.  30 tablet  11  . lisinopril (PRINIVIL,ZESTRIL) 5 MG tablet Take 1 tablet (5 mg total) by mouth daily.  30 tablet  5  . nitroGLYCERIN (NITROSTAT) 0.4 MG SL tablet Place 0.4 mg under the tongue every 5 (five) minutes as needed. For chest  pain      . pregabalin (LYRICA) 50 MG capsule Take 50 mg by mouth 2 (two) times daily.       . propranolol (INDERAL) 10 MG tablet Take 10 mg by mouth daily as needed. As needed for palpitations      . simvastatin (ZOCOR) 20 MG tablet Take 1 tablet (20 mg total) by mouth every evening.  30 tablet  5  . acetaminophen-codeine (TYLENOL #3) 300-30 MG per tablet Take 1 tablet by mouth 2 (two) times daily. For pain      . albuterol (PROVENTIL HFA;VENTOLIN HFA) 108 (90 BASE) MCG/ACT inhaler Inhale 2 puffs into the lungs every 6 (six) hours as needed. For shortness of breath  1 Inhaler  3  . ALPRAZolam (XANAX) 0.25 MG tablet Take 0.25 mg by mouth 2 (two) times daily.      Marland Kitchen aspirin 325 MG buffered tablet Take 325 mg by mouth daily as needed for pain. For headache      . aspirin EC 81 MG tablet Take 81 mg by mouth daily.      . citalopram (CELEXA) 40 MG tablet Take 20 mg by mouth daily.       Marland Kitchen estradiol (VIVELLE-DOT) 0.05 MG/24HR Place 1 patch (0.05 mg total) onto the skin once a week. Apply on Thursday  8 patch  11  . levothyroxine (SYNTHROID, LEVOTHROID) 50 MCG tablet Take 50 mcg by mouth daily.        . metoprolol tartrate (LOPRESSOR) 25 MG tablet TAKE 1/2  TABLET BY MOUTH TWICE DAILY  60 tablet  11   No current facility-administered medications on file prior to visit.    Allergies  Allergen Reactions  . Hydrocodone Nausea And Vomiting and Other (See Comments)    Passed out, threw up, blood pressure dropped  . Levofloxacin Other (See Comments)    Pass out, vomiting  . Oxycodone Other (See Comments)    Rapid heart beat  . Clarithromycin Other (See Comments)    Vomiting    Past Medical History  Diagnosis Date  . Bronchiectasis     with history of Legionaires with chronic rales/rhonchi  . Pollen allergies   . Hypertension     Negative renal duplex in December of 2012  . Allergic rhinitis   . Diastolic dysfunction     a. per echo 08/2010 with normal LV function;  b. 06/2011 Echo: EF 65-70%   . Idiopathic peripheral neuropathy   . Hypothyroidism   . Depression   . H/O hiatal hernia   . Osteopenia 02/2011    t score - 2.1  . Hypercholesteremia   . Chest pain at rest   . NSTEMI (non-ST elevated myocardial infarction)     a. Normal coronaries per cath August 2012 and negative CT angio for PE;  b. 06/2011 Repeat admission w/ chest pain and elevated troponin's, CTA Chest w/o PE  . Asthma   . MAC (mycobacterium avium-intracellulare complex)   . Shortness of breath     "sometimes just lying down" (04/15/2012)  . Pneumonia     "recurrent" (04/15/2012)  . H/O Legionnaire's disease 11/1976  . History of blood transfusion 1972    "w/hysterectomy" (04/15/2012)  . GERD (gastroesophageal reflux disease)     "just recently" (04/15/2012)  . Daily headache     "over the last week" 04/15/2012   . Migraines     "outgrew them" (04/15/2012)  . Seizures 1930's    "3-4 as a preteen" (04/15/2012)  . Arthritis     "a little; in my back and hands" (04/15/2012)  . Anxiety     Past Surgical History  Procedure Laterality Date  . Dilation and curettage of uterus    . Total abdominal hysterectomy w/ bilateral salpingoophorectomy  1972    leiomyomata, menorrhagia  . Appendectomy    . Abdominal hysterectomy  1972  . Cardiac catheterization  August 2012    Normal coronaries.  . Tonsillectomy  1930    History  Smoking status  . Never Smoker   Smokeless tobacco  . Never Used    History  Alcohol Use  . 1.2 oz/week  . 2 Glasses of wine per week    Comment: wine 1-2 week    Family History  Problem Relation Age of Onset  . Hypertension Mother     stroke  . Cancer Brother     prostrate    Review of Systems: The review of systems is per the HPI.  All other systems were reviewed and are negative.  Physical Exam: BP 129/73  Pulse 63  Ht 5\' 6"  (1.676 m)  Wt 133 lb (60.328 kg)  BMI 21.48 kg/m2  LMP 01/14/1970 Patient is very pleasant and in no acute distress. Skin is warm and dry. Color is  normal.  HEENT is unremarkable. Normocephalic/atraumatic. PERRL. Sclera are nonicteric. Neck is supple. No masses. No JVD. Lungs show some persistant rales which are chronic. Cardiac exam shows a regular rate and rhythm. Abdomen is soft. Extremities are without edema. Gait and ROM are  intact. No gross neurologic deficits noted.  LABORATORY DATA:  Lab Results  Component Value Date   WBC 11.2* 04/16/2012   HGB 10.6* 04/16/2012   HCT 33.5* 04/16/2012   PLT 208 04/16/2012   GLUCOSE 94 04/16/2012   CHOL 143 10/29/2011   TRIG 63.0 10/29/2011   HDL 65.40 10/29/2011   LDLCALC 65 10/29/2011   ALT 10 11/23/2011   AST 24 11/23/2011   NA 137 04/16/2012   K 3.8 04/16/2012   CL 101 04/16/2012   CREATININE 0.78 04/16/2012   BUN 14 04/16/2012   CO2 28 04/16/2012   TSH 3.321 11/23/2011   INR 1.00 06/19/2011   HGBA1C 6.1* 09/06/2010   ECG: 06/11/2012: Sinus bradycardia at 58 beats a minute. She has no ST or T wave changes.  Assessment / Plan:

## 2012-06-11 NOTE — Assessment & Plan Note (Signed)
She's had a history of a non-ST segment elevation myocardial infarct in the past. Cardiac cath revealed  only minimal coronary artery irregularities.

## 2012-06-11 NOTE — Patient Instructions (Addendum)
Your physician wants you to follow-up in: 1 YEAR.  You will receive a reminder letter in the mail two months in advance. If you don't receive a letter, please call our office to schedule the follow-up appointment.  Your physician recommends that you continue on your current medications as directed. Please refer to the Current Medication list given to you today.  

## 2012-06-11 NOTE — Assessment & Plan Note (Signed)
Her blood pressure is doing very well today. I suspect a lot of her problems are due to her lack of sleep. She doesn't sleep well at night and has various aches and pains after a sleepless night. This also would typically affect her blood pressure. Her blood pressure is normal today. We'll continue with her same medications. I've reassured her that blood pressure variability is normal.  I see her again in one year for followup office visit.

## 2012-06-17 ENCOUNTER — Ambulatory Visit: Payer: Medicare Other | Admitting: Psychiatry

## 2012-06-19 DIAGNOSIS — F410 Panic disorder [episodic paroxysmal anxiety] without agoraphobia: Secondary | ICD-10-CM | POA: Diagnosis not present

## 2012-06-19 DIAGNOSIS — R4182 Altered mental status, unspecified: Secondary | ICD-10-CM | POA: Diagnosis not present

## 2012-06-22 ENCOUNTER — Other Ambulatory Visit: Payer: Self-pay | Admitting: Family Medicine

## 2012-06-22 DIAGNOSIS — R4182 Altered mental status, unspecified: Secondary | ICD-10-CM

## 2012-06-24 ENCOUNTER — Ambulatory Visit (INDEPENDENT_AMBULATORY_CARE_PROVIDER_SITE_OTHER): Payer: Medicare Other | Admitting: Psychiatry

## 2012-06-24 DIAGNOSIS — F410 Panic disorder [episodic paroxysmal anxiety] without agoraphobia: Secondary | ICD-10-CM

## 2012-06-25 ENCOUNTER — Ambulatory Visit
Admission: RE | Admit: 2012-06-25 | Discharge: 2012-06-25 | Disposition: A | Discharge status: Home / Self Care | Payer: Medicare Other | Source: Ambulatory Visit | Attending: Family Medicine | Admitting: Family Medicine

## 2012-06-25 DIAGNOSIS — F29 Unspecified psychosis not due to a substance or known physiological condition: Secondary | ICD-10-CM | POA: Diagnosis not present

## 2012-06-25 DIAGNOSIS — R4182 Altered mental status, unspecified: Secondary | ICD-10-CM | POA: Diagnosis not present

## 2012-07-08 ENCOUNTER — Ambulatory Visit (INDEPENDENT_AMBULATORY_CARE_PROVIDER_SITE_OTHER): Payer: Medicare Other | Admitting: Psychiatry

## 2012-07-08 DIAGNOSIS — F410 Panic disorder [episodic paroxysmal anxiety] without agoraphobia: Secondary | ICD-10-CM | POA: Diagnosis not present

## 2012-07-15 ENCOUNTER — Ambulatory Visit (INDEPENDENT_AMBULATORY_CARE_PROVIDER_SITE_OTHER): Payer: Medicare Other | Admitting: Emergency Medicine

## 2012-07-15 ENCOUNTER — Encounter: Payer: Self-pay | Admitting: Emergency Medicine

## 2012-07-15 VITALS — BP 110/70 | HR 69 | Temp 98.0°F | Ht 66.5 in | Wt 132.4 lb

## 2012-07-15 DIAGNOSIS — J479 Bronchiectasis, uncomplicated: Secondary | ICD-10-CM

## 2012-07-15 NOTE — Progress Notes (Signed)
79 yo never smoker, hx remote Legionella PNA, diagnosed with suspected asthma about 10 years ago. PFT's 7/10 with mixed disease, mild AFL. Formerly treated with Advair and albuterol, stopped when she moved to [place] in 2008. Stayed on Singulair. At original consult we d/c'd ACE-I. Started on Symbicort.   ROV 11/11/11 --  hx bronchiectasis, chronic coughing, CAD. CT scan consistent w MAIC. We have discussed empiric MAIC therapy, but decided to defer for now. She tells me that she was treated for bronchitis recently with abx + pred. Last time we added symbicort, she feels that it is helping her >> her cough is better.   ROV 03/10/12 -- hx bronchiectasis, chronic coughing, CAD. CT scan consistent w [facility]IC. She has had trouble since last visit, she has been dealing w panic attacks - some better on xanax. She has had some scratchy throat, hoarse voice. Her cough has overall been better. She remains on symbicort, uses albuterol 1-2x a day. Feels "bad" sometimes, weak, fatigued. ? Whether this relates to her hx MAIC. Remains on lisinopril.  ROV 06/03/12 -- bronchiectasis, chronic coughing, CAD. CT scan consistent w [facility]IC. She was admitted beginning of April for bronchitis and then severe GERD and CP (? Due to the prednisone). Currently sx are minimal. Very little cough. She stopped symbicort temporarily due to thrush - back on it now.   ROV 07/15/12 -- bronchiectasis, chronic coughing, CAD. CT scan consistent w MAIC. Tried stopping Symbicort > seems to have tolerated. Minimal cough. Using albuterol rarely She has anxiety attacks, has been having more trouble last few days.    Filed Vitals:   07/15/12 1407  BP: 110/70  Pulse: 69  Temp: 98 F (36.7 C)   Gen: Pleasant, thin, in no distress, normal affect  ENT: No lesions,  mouth clear,  oropharynx clear, no postnasal drip  Neck: No JVD, no TMG, no carotid bruits  Lungs: No use of accessory muscles, no dullness to percussion, few scattered insp crackles  L>>R  Cardiovascular: RRR, heart sounds normal, no murmur or gallops, no peripheral edema  Musculoskeletal: No deformities, no cyanosis or clubbing  Neuro: alert, non focal  Skin: Warm, no lesions or rashes  03/10/12 --  Comparison: 11/23/2011.  Findings: Mediastinal lymph nodes measure up to 12 mm in the  precarinal station, as before. Hilar regions are difficult to  definitively evaluate without IV contrast. No axillary adenopathy.  Heart is mildly enlarged. No pericardial effusion. Moderate  hiatal hernia.  Trace amount of loculated pleural fluid is seen in the posterior  medial inferior left hemithorax. Mild biapical pleural parenchymal  scarring. Interval progression of bibasilar predominant  peribronchovascular nodularity, areas of airspace consolidation and  scattered bronchiectasis. Airway is otherwise unremarkable.  Incidental imaging of the upper abdomen shows no acute findings.  No worrisome lytic or sclerotic lesions. Degenerative changes are  seen in the spine.  IMPRESSION:  1. Interval progression of patchy bibasilar predominant  peribronchovascular nodularity, consolidation and bronchiectasis.  Findings are suggestive of mycobacterium avium complex (MAC).  2. Tiny loculated left pleural fluid collection.     BRONCHIECTASIS Cough seems to be stable even off Symbicort.  - continue to follow - albuterol prn - no plans to treat MAIC at this time; reconsider if she clinically changes.  - rov 6 months

## 2012-07-15 NOTE — Patient Instructions (Signed)
Please continue to have albuterol available to use as needed We will not start any new medications at this time.  Follow with Dr Delton Coombes in 6 months or sooner if you have any problems

## 2012-07-15 NOTE — Assessment & Plan Note (Addendum)
Cough seems to be stable even off Symbicort.  - continue to follow - albuterol prn - no plans to treat MAIC at this time; reconsider if she clinically changes.  - rov 6 months

## 2012-07-22 ENCOUNTER — Ambulatory Visit (INDEPENDENT_AMBULATORY_CARE_PROVIDER_SITE_OTHER): Payer: Medicare Other | Admitting: Psychiatry

## 2012-07-22 DIAGNOSIS — F410 Panic disorder [episodic paroxysmal anxiety] without agoraphobia: Secondary | ICD-10-CM

## 2012-08-01 ENCOUNTER — Emergency Department (HOSPITAL_COMMUNITY)
Admission: EM | Admit: 2012-08-01 | Discharge: 2012-08-02 | Disposition: A | Discharge status: Home / Self Care | Payer: Medicare Other | Attending: Emergency Medicine | Admitting: Emergency Medicine

## 2012-08-01 ENCOUNTER — Encounter (HOSPITAL_COMMUNITY): Payer: Self-pay

## 2012-08-01 ENCOUNTER — Emergency Department (HOSPITAL_COMMUNITY): Payer: Medicare Other

## 2012-08-01 DIAGNOSIS — R079 Chest pain, unspecified: Secondary | ICD-10-CM | POA: Diagnosis not present

## 2012-08-01 DIAGNOSIS — I252 Old myocardial infarction: Secondary | ICD-10-CM | POA: Insufficient documentation

## 2012-08-01 DIAGNOSIS — Z8701 Personal history of pneumonia (recurrent): Secondary | ICD-10-CM | POA: Diagnosis not present

## 2012-08-01 DIAGNOSIS — Z7982 Long term (current) use of aspirin: Secondary | ICD-10-CM | POA: Diagnosis not present

## 2012-08-01 DIAGNOSIS — Z8669 Personal history of other diseases of the nervous system and sense organs: Secondary | ICD-10-CM | POA: Diagnosis not present

## 2012-08-01 DIAGNOSIS — Z8739 Personal history of other diseases of the musculoskeletal system and connective tissue: Secondary | ICD-10-CM | POA: Diagnosis not present

## 2012-08-01 DIAGNOSIS — Z79899 Other long term (current) drug therapy: Secondary | ICD-10-CM | POA: Diagnosis not present

## 2012-08-01 DIAGNOSIS — E7800 Pure hypercholesterolemia, unspecified: Secondary | ICD-10-CM | POA: Diagnosis not present

## 2012-08-01 DIAGNOSIS — E039 Hypothyroidism, unspecified: Secondary | ICD-10-CM | POA: Insufficient documentation

## 2012-08-01 DIAGNOSIS — F411 Generalized anxiety disorder: Secondary | ICD-10-CM | POA: Diagnosis not present

## 2012-08-01 DIAGNOSIS — J45909 Unspecified asthma, uncomplicated: Secondary | ICD-10-CM | POA: Diagnosis not present

## 2012-08-01 DIAGNOSIS — F3289 Other specified depressive episodes: Secondary | ICD-10-CM | POA: Insufficient documentation

## 2012-08-01 DIAGNOSIS — I10 Essential (primary) hypertension: Secondary | ICD-10-CM | POA: Diagnosis not present

## 2012-08-01 DIAGNOSIS — Z8619 Personal history of other infectious and parasitic diseases: Secondary | ICD-10-CM | POA: Insufficient documentation

## 2012-08-01 DIAGNOSIS — R0789 Other chest pain: Secondary | ICD-10-CM | POA: Insufficient documentation

## 2012-08-01 DIAGNOSIS — Z8719 Personal history of other diseases of the digestive system: Secondary | ICD-10-CM | POA: Diagnosis not present

## 2012-08-01 DIAGNOSIS — Z8679 Personal history of other diseases of the circulatory system: Secondary | ICD-10-CM | POA: Diagnosis not present

## 2012-08-01 DIAGNOSIS — Z8709 Personal history of other diseases of the respiratory system: Secondary | ICD-10-CM | POA: Insufficient documentation

## 2012-08-01 DIAGNOSIS — R110 Nausea: Secondary | ICD-10-CM | POA: Diagnosis not present

## 2012-08-01 DIAGNOSIS — R5383 Other fatigue: Secondary | ICD-10-CM | POA: Diagnosis not present

## 2012-08-01 DIAGNOSIS — F329 Major depressive disorder, single episode, unspecified: Secondary | ICD-10-CM | POA: Diagnosis not present

## 2012-08-01 LAB — CBC
Hemoglobin: 10 g/dL — ABNORMAL LOW (ref 12.0–15.0)
Platelets: 209 10*3/uL (ref 150–400)
RBC: 3.82 MIL/uL — ABNORMAL LOW (ref 3.87–5.11)
WBC: 8.2 10*3/uL (ref 4.0–10.5)

## 2012-08-01 LAB — PRO B NATRIURETIC PEPTIDE: Pro B Natriuretic peptide (BNP): 570.4 pg/mL — ABNORMAL HIGH (ref 0–450)

## 2012-08-01 LAB — POCT I-STAT TROPONIN I: Troponin i, poc: 0.01 ng/mL (ref 0.00–0.08)

## 2012-08-01 LAB — BASIC METABOLIC PANEL
CO2: 28 mEq/L (ref 19–32)
Chloride: 99 mEq/L (ref 96–112)
Glucose, Bld: 102 mg/dL — ABNORMAL HIGH (ref 70–99)
Sodium: 134 mEq/L — ABNORMAL LOW (ref 135–145)

## 2012-08-01 NOTE — ED Notes (Signed)
Next of Kin Info:  1. Altamese Cabal (daughter) [phone] (call first; will come and get patient if discharged)  2. Nedra Hai (husband) [phone] 3. Johnny Bridge (daughter) [phone]

## 2012-08-01 NOTE — ED Notes (Signed)
Patient took propanolol, lisinopril, nitro x1, xanax and tylenol prior to EMS arrival

## 2012-08-01 NOTE — ED Provider Notes (Signed)
History    CSN: [id number] Arrival date & time 08/01/12  2054  First MD Initiated Contact with Patient 08/01/12 2119     Chief Complaint  Patient presents with  . Chest Pain   (Consider location/radiation/quality/duration/timing/severity/associated sxs/prior Treatment) HPI Comments: Sarah [name] is a 79 y.o. female was at home, when, after eating, she developed a sensation of fullness in her mid-chest, which then expanded laterally to both sides. She immediately took all of her medicines for blood pressure, and nitroglycerin and then called EMS. EMS. Advised taking a full-strength aspirin, which she did. The pain resolved prior to arrival of ambulance. During transport. She did not receive additional treatments. She has had similar episodes in the past, when she had reflux and GERD. She was admitted in April 2014 with a similar episode, which occurred, while she was taking prednisone. She had cardiac catheterization 2 years ago that showed nonobstructive coronary disease. She denies recent illnesses. She denies fever, chills, vomiting, back, pain, weakness, or dizziness. She has had an occasional cough, today, that is non-productive. She has not had any other problems in the last 2 months. She is in ED, with family members. She lives with her husband. There no other modifying factors.  Patient is a 79 y.o. female presenting with chest pain. The history is provided by the patient.  Chest Pain  Past Medical History  Diagnosis Date  . Bronchiectasis     with history of Legionaires with chronic rales/rhonchi  . Pollen allergies   . Hypertension     Negative renal duplex in December of 2012  . Allergic rhinitis   . Diastolic dysfunction     a. per echo 08/2010 with normal LV function;  b. 06/2011 Echo: EF 65-70%  . Idiopathic peripheral neuropathy   . Hypothyroidism   . Depression   . H/O hiatal hernia   . Osteopenia 02/2011    t score - 2.1  . Hypercholesteremia   . Chest pain at rest    . NSTEMI (non-ST elevated myocardial infarction)     a. Normal coronaries per cath August 2012 and negative CT angio for PE;  b. 06/2011 Repeat admission w/ chest pain and elevated troponin's, CTA Chest w/o PE  . Asthma   . MAC (mycobacterium avium-intracellulare complex)   . Shortness of breath     "sometimes just lying down" (04/15/2012)  . Pneumonia     "recurrent" (04/15/2012)  . H/O Legionnaire's disease 11/1976  . History of blood transfusion 1972    "w/hysterectomy" (04/15/2012)  . GERD (gastroesophageal reflux disease)     "just recently" (04/15/2012)  . Daily headache     "over the last week" 04/15/2012   . Migraines     "outgrew them" (04/15/2012)  . Seizures 1930's    "3-4 as a preteen" (04/15/2012)  . Arthritis     "a little; in my back and hands" (04/15/2012)  . Anxiety    Past Surgical History  Procedure Laterality Date  . Dilation and curettage of uterus    . Total abdominal hysterectomy w/ bilateral salpingoophorectomy  1972    leiomyomata, menorrhagia  . Appendectomy    . Abdominal hysterectomy  1972  . Cardiac catheterization  August 2012    Normal coronaries.  . Tonsillectomy  1930   Family History  Problem Relation Age of Onset  . Hypertension Mother     stroke  . Cancer Brother     prostrate   History  Substance Use Topics  . Smoking  status: Never Smoker   . Smokeless tobacco: Never Used  . Alcohol Use: 1.2 oz/week    2 Glasses of wine per week     Comment: wine 1-2 week   OB History   Grav Para Term Preterm Abortions TAB SAB Ect Mult Living   2 2        2      Review of Systems  Cardiovascular: Positive for chest pain.  All other systems reviewed and are negative.    Allergies  Hydrocodone; Levofloxacin; Oxycodone; Clarithromycin; and Isosorbide  Home Medications   Current Outpatient Rx  Name  Route  Sig  Dispense  Refill  . acetaminophen-codeine (TYLENOL #3) 300-30 MG per tablet   Oral   Take 1 tablet by mouth 2 (two) times daily as needed  for pain (anxiety).          Marland Kitchen albuterol (PROVENTIL HFA;VENTOLIN HFA) 108 (90 BASE) MCG/ACT inhaler   Inhalation   Inhale 2 puffs into the lungs 2 (two) times daily as needed for wheezing or shortness of breath.         . ALPRAZolam (XANAX) 0.25 MG tablet   Oral   Take 0.25 mg by mouth 2 (two) times daily.         Marland Kitchen aspirin 325 MG buffered tablet   Oral   Take 325 mg by mouth daily. For headache         . aspirin-acetaminophen-caffeine (EXCEDRIN EXTRA STRENGTH) 250-250-65 MG per tablet   Oral   Take 1 tablet by mouth 2 (two) times daily as needed for pain.         . citalopram (CELEXA) 40 MG tablet   Oral   Take 20 mg by mouth daily.          Marland Kitchen estradiol (VIVELLE-DOT) 0.05 MG/24HR   Transdermal   Place 1 patch (0.05 mg total) onto the skin once a week. Apply on Thursday   8 patch   11   . levothyroxine (SYNTHROID, LEVOTHROID) 50 MCG tablet   Oral   Take 50 mcg by mouth daily.           Marland Kitchen lisinopril (PRINIVIL,ZESTRIL) 5 MG tablet   Oral   Take 1 tablet (5 mg total) by mouth daily.   30 tablet   5   . metoprolol tartrate (LOPRESSOR) 25 MG tablet   Oral   Take 12.5 mg by mouth 2 (two) times daily.         . nitroGLYCERIN (NITROSTAT) 0.4 MG SL tablet   Sublingual   Place 0.4 mg under the tongue every 5 (five) minutes as needed for chest pain.         . pregabalin (LYRICA) 50 MG capsule   Oral   Take 50 mg by mouth 2 (two) times daily.          . propranolol (INDERAL) 10 MG tablet   Oral   Take 10 mg by mouth daily as needed. As needed for palpitations         . simvastatin (ZOCOR) 20 MG tablet   Oral   Take 1 tablet (20 mg total) by mouth every evening.   30 tablet   5    BP 140/68  Pulse 58  Temp(Src) 98.4 F (36.9 C) (Oral)  Resp 14  Ht 5\' 7"  (1.702 m)  Wt 132 lb (59.875 kg)  BMI 20.67 kg/m2  SpO2 96%  LMP 01/14/1970 Physical Exam  Nursing note and vitals reviewed. Constitutional: She is  oriented to person, place, and time.  She appears well-developed.  Elderly, frail  HENT:  Head: Normocephalic and atraumatic.  Eyes: Conjunctivae and EOM are normal. Pupils are equal, round, and reactive to light.  Neck: Normal range of motion and phonation normal. Neck supple.  Cardiovascular: Normal rate, regular rhythm and intact distal pulses.   Pulmonary/Chest: Effort normal and breath sounds normal. She exhibits no tenderness.  Abdominal: Soft. She exhibits no distension. There is no tenderness. There is no guarding.  Musculoskeletal: Normal range of motion.  Neurological: She is alert and oriented to person, place, and time. She has normal strength. She exhibits normal muscle tone.  Skin: Skin is warm and dry.  Psychiatric: She has a normal mood and affect. Her behavior is normal. Judgment and thought content normal.    ED Course  Procedures (including critical care time)  Findings discussed with patient and family members in the room with her. They agree with the plan of following 2 troponins here and going home if they are both negative.    Date: 08/01/12  Rate: 64  Rhythm: normal sinus rhythm  QRS Axis: normal  PR and QT Intervals: normal  ST/T Wave abnormalities: normal  PR and QRS Conduction Disutrbances:none  Narrative Interpretation:   Old EKG Reviewed: unchanged- 04/15/12       Labs Reviewed  CBC - Abnormal; Notable for the following:    RBC 3.82 (*)    Hemoglobin 10.0 (*)    HCT 32.2 (*)    RDW 16.0 (*)    All other components within normal limits  BASIC METABOLIC PANEL - Abnormal; Notable for the following:    Sodium 134 (*)    Glucose, Bld 102 (*)    GFR calc non Af Amer 51 (*)    GFR calc Af Amer 59 (*)    All other components within normal limits  PRO B NATRIURETIC PEPTIDE - Abnormal; Notable for the following:    Pro B Natriuretic peptide (BNP) 570.4 (*)    All other components within normal limits  POCT I-STAT TROPONIN I  POCT I-STAT TROPONIN I   Dg Chest Port 1 View  08/01/2012    *RADIOLOGY REPORT*  Clinical Data: Chest pain and shortness of breath  PORTABLE CHEST - 1 VIEW  Comparison: Prior radiograph from 04/15/2012  Findings: Cardiomegaly is stable as compared to the prior exam.  The lungs are well inflated.  No airspace consolidation, pleural effusion, or pulmonary edema identified.  There is no definite pleural effusion. Mild coarsening of the interstitial markings is unchanged.  Osseous structures are unchanged. Small hiatal hernia is again noted.  IMPRESSION: 1. Stable cardiomegaly without radiographic evidence of pulmonary edema or infectious pneumonitis.  2. Small hiatal hernia, unchanged.   Original Report Authenticated By: Rise Mu, M.D.   1. Chest pain     MDM  Atypical for cardiac chest pain, recurrent, recently evaluated in, admitted for same, and has a relatively recent cardiac catheterization. Clinically, I doubt ACS, PE, or pneumonia. Initial evaluation is negative. A second troponin was ordered to evaluate for the possibility of occult injury. With a negative delta troponin, it would be highly reassuring that this did not represent an acute coronary syndrome.    Nursing Notes Reviewed/ Care Coordinated, and agree without changes. Applicable Imaging Reviewed.  Interpretation of Laboratory Data incorporated into ED treatment    Flint Melter, MD 08/02/12 [phone]

## 2012-08-01 NOTE — ED Notes (Signed)
Presents with nonradiating, mid-chest pain around 1930 that came on suddenly after dinner. Reports diaphoresis, dizziness, weakness and nausea at onset of pain. Describes pain as a dull ache, rates 3/10. Patient took her own meds. Upon EMS arrival, patient was pain & symptom free. Skin warm and dry.

## 2012-08-02 LAB — POCT I-STAT TROPONIN I: Troponin i, poc: 0.01 ng/mL (ref 0.00–0.08)

## 2012-08-02 NOTE — ED Notes (Signed)
Assisted pt to the bathroom. Pt requested some water and RN ok'd it. Pt given a cup of ice water.

## 2012-08-02 NOTE — ED Notes (Signed)
Family at bedside. 

## 2012-08-05 ENCOUNTER — Ambulatory Visit (INDEPENDENT_AMBULATORY_CARE_PROVIDER_SITE_OTHER): Payer: Medicare Other | Admitting: Psychiatry

## 2012-08-05 DIAGNOSIS — F410 Panic disorder [episodic paroxysmal anxiety] without agoraphobia: Secondary | ICD-10-CM | POA: Diagnosis not present

## 2012-08-10 ENCOUNTER — Other Ambulatory Visit: Payer: Self-pay | Admitting: *Deleted

## 2012-08-10 MED ORDER — SIMVASTATIN 20 MG PO TABS: 20.0000 mg | ORAL_TABLET | Freq: Every evening | ORAL | Status: DC

## 2012-08-19 ENCOUNTER — Ambulatory Visit (INDEPENDENT_AMBULATORY_CARE_PROVIDER_SITE_OTHER): Payer: Medicare Other | Admitting: Psychiatry

## 2012-08-19 DIAGNOSIS — F410 Panic disorder [episodic paroxysmal anxiety] without agoraphobia: Secondary | ICD-10-CM | POA: Diagnosis not present

## 2012-08-21 DIAGNOSIS — E46 Unspecified protein-calorie malnutrition: Secondary | ICD-10-CM | POA: Diagnosis not present

## 2012-08-21 DIAGNOSIS — J479 Bronchiectasis, uncomplicated: Secondary | ICD-10-CM | POA: Diagnosis not present

## 2012-08-21 DIAGNOSIS — A318 Other mycobacterial infections: Secondary | ICD-10-CM | POA: Diagnosis not present

## 2012-08-21 DIAGNOSIS — F410 Panic disorder [episodic paroxysmal anxiety] without agoraphobia: Secondary | ICD-10-CM | POA: Diagnosis not present

## 2012-08-27 ENCOUNTER — Other Ambulatory Visit: Payer: Self-pay | Admitting: Gynecology

## 2012-09-01 ENCOUNTER — Encounter: Payer: Self-pay | Admitting: Gynecology

## 2012-09-02 ENCOUNTER — Ambulatory Visit (INDEPENDENT_AMBULATORY_CARE_PROVIDER_SITE_OTHER): Payer: Medicare Other | Admitting: Psychiatry

## 2012-09-02 DIAGNOSIS — Z630 Problems in relationship with spouse or partner: Secondary | ICD-10-CM

## 2012-09-02 DIAGNOSIS — F410 Panic disorder [episodic paroxysmal anxiety] without agoraphobia: Secondary | ICD-10-CM | POA: Diagnosis not present

## 2012-09-16 ENCOUNTER — Ambulatory Visit: Payer: Medicare Other | Admitting: Psychiatry

## 2012-09-21 DIAGNOSIS — R05 Cough: Secondary | ICD-10-CM | POA: Diagnosis not present

## 2012-09-21 DIAGNOSIS — E039 Hypothyroidism, unspecified: Secondary | ICD-10-CM | POA: Diagnosis not present

## 2012-09-21 DIAGNOSIS — Z23 Encounter for immunization: Secondary | ICD-10-CM | POA: Diagnosis not present

## 2012-09-21 DIAGNOSIS — I214 Non-ST elevation (NSTEMI) myocardial infarction: Secondary | ICD-10-CM | POA: Diagnosis not present

## 2012-09-21 DIAGNOSIS — IMO0002 Reserved for concepts with insufficient information to code with codable children: Secondary | ICD-10-CM | POA: Diagnosis not present

## 2012-10-05 ENCOUNTER — Encounter: Payer: Self-pay | Admitting: Gynecology

## 2012-10-05 ENCOUNTER — Ambulatory Visit (INDEPENDENT_AMBULATORY_CARE_PROVIDER_SITE_OTHER): Payer: Medicare Other | Admitting: Gynecology

## 2012-10-05 VITALS — BP 120/76 | Ht 66.0 in | Wt 128.0 lb

## 2012-10-05 DIAGNOSIS — N952 Postmenopausal atrophic vaginitis: Secondary | ICD-10-CM | POA: Diagnosis not present

## 2012-10-05 DIAGNOSIS — Z79890 Hormone replacement therapy (postmenopausal): Secondary | ICD-10-CM

## 2012-10-05 DIAGNOSIS — N951 Menopausal and female climacteric states: Secondary | ICD-10-CM | POA: Diagnosis not present

## 2012-10-05 MED ORDER — ESTRADIOL 0.05 MG/24HR TD PTTW: MEDICATED_PATCH | TRANSDERMAL | Status: DC

## 2012-10-05 NOTE — Patient Instructions (Signed)
Follow up in one year, sooner as needed. 

## 2012-10-05 NOTE — Progress Notes (Signed)
Sarah [name] Sep 25, 1921 [id number]        79 y.o.  G2P2 for followup exam.  Several issues that below.  Past medical history,surgical history, medications, allergies, family history and social history were all reviewed and documented in the EPIC chart.  ROS:  Performed and pertinent positives and negatives are included in the history, assessment and plan .  Exam: Kim assistant Filed Vitals:   10/05/12 1050  BP: 120/76  Height: 5\' 6"  (1.676 m)  Weight: 128 lb (58.06 kg)   General appearance  Normal Skin grossly normal Head/Neck normal with no cervical or supraclavicular adenopathy thyroid normal Lungs  bilateral rails Cardiac RR, without RMG Abdominal  soft, nontender, without masses, organomegaly or hernia Breasts  examined lying and sitting without masses, retractions, discharge or axillary adenopathy. Pelvic  Ext/BUS/vagina  atrophic changes  Adnexa  Without masses or tenderness    Anus and perineum  normal   Rectovaginal  normal sphincter tone without palpated masses or tenderness.    Assessment/Plan:  79 y.o. G2P2 female for followup exam.   1. Menopausal symptoms. Patient doing well on her estradiol 0.05 mg patch. I again reviewed the issues and risks of HRT particularly with advancing age and multiple medical issues. Risks of stroke heart attack DVT possible breast cancer reviewed. Advantage of transdermal over oral also discussed. Unusual nature to continue at her age and the ACOG and NAMS statements for lowest dose for shortest period of time also discussed. Patient very strongly wants to continue. States that at my age anything to make me feel better is worth it and accept the risks. I refilled her x1 year. 2. Mammography Toma Copier I recommended she schedule this and she agrees to do so. SBE monthly reviewed. 3. Osteopenia. DEXA 02/2011 with T score -2.1. Significant decline at measured sites. We discussed this last year I again reviewed my recommendations that she start  on a bisphosphonate. Risks and devastating sequelae following fracture of the hip discussed. Patient clearly understands the issues and declines treatment. 4. Colonoscopy a number of years ago. Patient refuses to have another colonoscopy. I reviewed the benefit of early detection or precancerous polyp removal and she clearly understands the issues and declines. 5. Pap smear number of years ago. No history of abnormal Pap smears previously. Status post hysterectomy in the past for benign indications. We both agree on stop screening. No Pap smear done today. 6. Health maintenance. The blood work done this is all done through her other physician's offices. Followup one year, sooner as needed.  Note: This document was prepared with digital dictation and possible smart phrase technology. Any transcriptional errors that result from this process are unintentional.   Sarah Lords MD, 1:52 PM 10/05/2012

## 2012-10-07 DIAGNOSIS — Z23 Encounter for immunization: Secondary | ICD-10-CM | POA: Diagnosis not present

## 2012-10-13 ENCOUNTER — Ambulatory Visit (INDEPENDENT_AMBULATORY_CARE_PROVIDER_SITE_OTHER): Payer: Medicare Other | Admitting: Psychiatry

## 2012-10-13 DIAGNOSIS — F410 Panic disorder [episodic paroxysmal anxiety] without agoraphobia: Secondary | ICD-10-CM

## 2012-10-27 ENCOUNTER — Ambulatory Visit (INDEPENDENT_AMBULATORY_CARE_PROVIDER_SITE_OTHER): Payer: Medicare Other | Admitting: Psychiatry

## 2012-10-27 DIAGNOSIS — F410 Panic disorder [episodic paroxysmal anxiety] without agoraphobia: Secondary | ICD-10-CM | POA: Diagnosis not present

## 2012-11-10 ENCOUNTER — Ambulatory Visit (INDEPENDENT_AMBULATORY_CARE_PROVIDER_SITE_OTHER): Payer: Medicare Other | Admitting: Psychiatry

## 2012-11-10 DIAGNOSIS — F410 Panic disorder [episodic paroxysmal anxiety] without agoraphobia: Secondary | ICD-10-CM | POA: Diagnosis not present

## 2012-11-11 DIAGNOSIS — J479 Bronchiectasis, uncomplicated: Secondary | ICD-10-CM | POA: Diagnosis not present

## 2012-11-11 DIAGNOSIS — M899 Disorder of bone, unspecified: Secondary | ICD-10-CM | POA: Diagnosis not present

## 2012-11-11 DIAGNOSIS — I720 Aneurysm of carotid artery: Secondary | ICD-10-CM | POA: Diagnosis not present

## 2012-11-11 DIAGNOSIS — F410 Panic disorder [episodic paroxysmal anxiety] without agoraphobia: Secondary | ICD-10-CM | POA: Diagnosis not present

## 2012-11-11 DIAGNOSIS — F339 Major depressive disorder, recurrent, unspecified: Secondary | ICD-10-CM | POA: Diagnosis not present

## 2012-11-11 DIAGNOSIS — R55 Syncope and collapse: Secondary | ICD-10-CM | POA: Diagnosis not present

## 2012-11-11 DIAGNOSIS — K449 Diaphragmatic hernia without obstruction or gangrene: Secondary | ICD-10-CM | POA: Diagnosis not present

## 2012-11-24 ENCOUNTER — Ambulatory Visit: Payer: Medicare Other | Admitting: Psychiatry

## 2012-11-25 ENCOUNTER — Ambulatory Visit: Payer: Medicare Other | Admitting: Psychiatry

## 2012-12-01 ENCOUNTER — Ambulatory Visit (INDEPENDENT_AMBULATORY_CARE_PROVIDER_SITE_OTHER): Payer: Medicare Other | Admitting: Psychiatry

## 2012-12-01 DIAGNOSIS — F410 Panic disorder [episodic paroxysmal anxiety] without agoraphobia: Secondary | ICD-10-CM | POA: Diagnosis not present

## 2012-12-02 ENCOUNTER — Ambulatory Visit: Payer: Medicare Other | Admitting: Psychiatry

## 2012-12-15 ENCOUNTER — Ambulatory Visit (INDEPENDENT_AMBULATORY_CARE_PROVIDER_SITE_OTHER): Payer: Medicare Other | Admitting: Psychiatry

## 2012-12-15 DIAGNOSIS — F410 Panic disorder [episodic paroxysmal anxiety] without agoraphobia: Secondary | ICD-10-CM

## 2013-01-12 ENCOUNTER — Ambulatory Visit (INDEPENDENT_AMBULATORY_CARE_PROVIDER_SITE_OTHER): Payer: Medicare Other | Admitting: Psychiatry

## 2013-01-12 DIAGNOSIS — F410 Panic disorder [episodic paroxysmal anxiety] without agoraphobia: Secondary | ICD-10-CM

## 2013-01-12 DIAGNOSIS — Z630 Problems in relationship with spouse or partner: Secondary | ICD-10-CM

## 2013-01-18 ENCOUNTER — Other Ambulatory Visit (HOSPITAL_COMMUNITY): Payer: Self-pay | Admitting: Cardiovascular Disease

## 2013-01-28 ENCOUNTER — Ambulatory Visit (INDEPENDENT_AMBULATORY_CARE_PROVIDER_SITE_OTHER): Payer: Medicare Other | Admitting: Psychiatry

## 2013-01-28 DIAGNOSIS — F410 Panic disorder [episodic paroxysmal anxiety] without agoraphobia: Secondary | ICD-10-CM

## 2013-01-28 DIAGNOSIS — Z7189 Other specified counseling: Secondary | ICD-10-CM | POA: Diagnosis not present

## 2013-01-28 DIAGNOSIS — Z630 Problems in relationship with spouse or partner: Secondary | ICD-10-CM | POA: Diagnosis not present

## 2013-01-29 DIAGNOSIS — R079 Chest pain, unspecified: Secondary | ICD-10-CM | POA: Diagnosis not present

## 2013-01-29 DIAGNOSIS — L299 Pruritus, unspecified: Secondary | ICD-10-CM | POA: Diagnosis not present

## 2013-01-29 DIAGNOSIS — J471 Bronchiectasis with (acute) exacerbation: Secondary | ICD-10-CM | POA: Diagnosis not present

## 2013-02-05 ENCOUNTER — Encounter: Payer: Self-pay | Admitting: Emergency Medicine

## 2013-02-05 ENCOUNTER — Ambulatory Visit (INDEPENDENT_AMBULATORY_CARE_PROVIDER_SITE_OTHER): Payer: Medicare Other | Admitting: Emergency Medicine

## 2013-02-05 VITALS — BP 120/74 | HR 81 | Ht 67.0 in | Wt 124.4 lb

## 2013-02-05 DIAGNOSIS — J479 Bronchiectasis, uncomplicated: Secondary | ICD-10-CM | POA: Diagnosis not present

## 2013-02-05 NOTE — Progress Notes (Signed)
80 yo never smoker, hx remote Legionella PNA, diagnosed with suspected asthma about 10 years ago. PFT's 7/10 with mixed disease, mild AFL. Formerly treated with Advair and albuterol, stopped when she moved to [place] in 2008. Stayed on Singulair. At original consult we d/c'd ACE-I. Started on Symbicort.   ROV 11/11/11 --  hx bronchiectasis, chronic coughing, CAD. CT scan consistent w MAIC. We have discussed empiric MAIC therapy, but decided to defer for now. She tells me that she was treated for bronchitis recently with abx + pred. Last time we added symbicort, she feels that it is helping her >> her cough is better.   ROV 03/10/12 -- hx bronchiectasis, chronic coughing, CAD. CT scan consistent w MAIC. She has had trouble since last visit, she has been dealing w panic attacks - some better on xanax. She has had some scratchy throat, hoarse voice. Her cough has overall been better. She remains on symbicort, uses albuterol 1-2x a day. Feels "bad" sometimes, weak, fatigued. ? Whether this relates to her hx MAIC. Remains on lisinopril.  ROV 06/03/12 -- bronchiectasis, chronic coughing, CAD. CT scan consistent w MAIC. She was admitted beginning of April for bronchitis and then severe GERD and CP (? Due to the prednisone). Currently sx are minimal. Very little cough. She stopped symbicort temporarily due to thrush - back on it now.   ROV 07/15/12 -- bronchiectasis, chronic coughing, CAD. CT scan consistent w MAIC. Tried stopping Symbicort > seems to have tolerated. Minimal cough. Using albuterol rarely She has anxiety attacks, has been having more trouble last few days.   ROV 02/05/13 -- bronchiectasis, chronic coughing, CAD. CT scan consistent w MAIC. She just had an AE of her bronchiectasis, some green sputum with blood tinges. Was treated with azithro and pred.  She is on albuterol prn  Filed Vitals:   02/05/13 1018  BP: 120/74  Pulse: 81  Height: 5\' 7"  (1.702 m)  Weight: 124 lb 6.4 oz (56.427 kg)  SpO2: 95%    Gen: Pleasant, thin, in no distress, normal affect  ENT: No lesions,  mouth clear,  oropharynx clear, no postnasal drip  Neck: No JVD, no TMG, no carotid bruits  Lungs: No use of accessory muscles, scattered insp crackles L>>R  Cardiovascular: RRR, heart sounds normal, no murmur or gallops, no peripheral edema  Musculoskeletal: No deformities, no cyanosis or clubbing  Neuro: alert, non focal  Skin: Warm, no lesions or rashes  03/10/12 --  Comparison: 11/23/2011.  Findings: Mediastinal lymph nodes measure up to 12 mm in the  precarinal station, as before. Hilar regions are difficult to  definitively evaluate without IV contrast. No axillary adenopathy.  Heart is mildly enlarged. No pericardial effusion. Moderate  hiatal hernia.  Trace amount of loculated pleural fluid is seen in the posterior  medial inferior left hemithorax. Mild biapical pleural parenchymal  scarring. Interval progression of bibasilar predominant  peribronchovascular nodularity, areas of airspace consolidation and  scattered bronchiectasis. Airway is otherwise unremarkable.  Incidental imaging of the upper abdomen shows no acute findings.  No worrisome lytic or sclerotic lesions. Degenerative changes are  seen in the spine.  IMPRESSION:  1. Interval progression of patchy bibasilar predominant  peribronchovascular nodularity, consolidation and bronchiectasis.  Findings are suggestive of mycobacterium avium complex (MAC).  2. Tiny loculated left pleural fluid collection.     BRONCHIECTASIS With an exacerbation associated with dyspnea, mild hemoptysis. She is improving after pred + abx.  - finish the remaining prednisone.  - albuterol prn -  no maintenance meds at this time -rov 3

## 2013-02-05 NOTE — Patient Instructions (Signed)
Please finish your prednisone as directed Use your albuterol as needed for shortness of breath or cough Follow with Dr Delton CoombesByrum in 3 months or sooner if you have any problems.

## 2013-02-05 NOTE — Assessment & Plan Note (Signed)
With an exacerbation associated with dyspnea, mild hemoptysis. She is improving after pred + abx.  - finish the remaining prednisone.  - albuterol prn - no maintenance meds at this time -rov 3

## 2013-02-09 ENCOUNTER — Ambulatory Visit (INDEPENDENT_AMBULATORY_CARE_PROVIDER_SITE_OTHER): Payer: Medicare Other | Admitting: Psychiatry

## 2013-02-09 DIAGNOSIS — Z630 Problems in relationship with spouse or partner: Secondary | ICD-10-CM | POA: Diagnosis not present

## 2013-02-09 DIAGNOSIS — F410 Panic disorder [episodic paroxysmal anxiety] without agoraphobia: Secondary | ICD-10-CM

## 2013-02-09 DIAGNOSIS — Z7189 Other specified counseling: Secondary | ICD-10-CM | POA: Diagnosis not present

## 2013-02-18 DIAGNOSIS — L080 Pyoderma: Secondary | ICD-10-CM | POA: Diagnosis not present

## 2013-02-18 DIAGNOSIS — A499 Bacterial infection, unspecified: Secondary | ICD-10-CM | POA: Diagnosis not present

## 2013-02-18 DIAGNOSIS — L981 Factitial dermatitis: Secondary | ICD-10-CM | POA: Diagnosis not present

## 2013-02-18 DIAGNOSIS — L089 Local infection of the skin and subcutaneous tissue, unspecified: Secondary | ICD-10-CM | POA: Diagnosis not present

## 2013-02-18 DIAGNOSIS — B9689 Other specified bacterial agents as the cause of diseases classified elsewhere: Secondary | ICD-10-CM | POA: Diagnosis not present

## 2013-02-23 ENCOUNTER — Ambulatory Visit (INDEPENDENT_AMBULATORY_CARE_PROVIDER_SITE_OTHER): Payer: Medicare Other | Admitting: Psychiatry

## 2013-02-23 DIAGNOSIS — F410 Panic disorder [episodic paroxysmal anxiety] without agoraphobia: Secondary | ICD-10-CM

## 2013-02-23 DIAGNOSIS — Z630 Problems in relationship with spouse or partner: Secondary | ICD-10-CM

## 2013-02-23 DIAGNOSIS — Z7189 Other specified counseling: Secondary | ICD-10-CM

## 2013-03-09 ENCOUNTER — Ambulatory Visit: Payer: Medicare Other | Admitting: Psychiatry

## 2013-03-16 ENCOUNTER — Ambulatory Visit (INDEPENDENT_AMBULATORY_CARE_PROVIDER_SITE_OTHER): Payer: Medicare Other | Admitting: Psychiatry

## 2013-03-16 DIAGNOSIS — F410 Panic disorder [episodic paroxysmal anxiety] without agoraphobia: Secondary | ICD-10-CM

## 2013-03-16 DIAGNOSIS — Z7189 Other specified counseling: Secondary | ICD-10-CM

## 2013-03-16 DIAGNOSIS — Z630 Problems in relationship with spouse or partner: Secondary | ICD-10-CM | POA: Diagnosis not present

## 2013-03-17 DIAGNOSIS — L280 Lichen simplex chronicus: Secondary | ICD-10-CM | POA: Diagnosis not present

## 2013-03-30 ENCOUNTER — Ambulatory Visit (INDEPENDENT_AMBULATORY_CARE_PROVIDER_SITE_OTHER): Payer: Medicare Other | Admitting: Psychiatry

## 2013-03-30 DIAGNOSIS — F410 Panic disorder [episodic paroxysmal anxiety] without agoraphobia: Secondary | ICD-10-CM | POA: Diagnosis not present

## 2013-03-30 DIAGNOSIS — Z7189 Other specified counseling: Secondary | ICD-10-CM | POA: Diagnosis not present

## 2013-03-30 DIAGNOSIS — Z630 Problems in relationship with spouse or partner: Secondary | ICD-10-CM

## 2013-04-04 ENCOUNTER — Emergency Department (HOSPITAL_COMMUNITY)
Admission: EM | Admit: 2013-04-04 | Discharge: 2013-04-04 | Disposition: A | Discharge status: Home / Self Care | Payer: Medicare Other | Attending: Emergency Medicine | Admitting: Emergency Medicine

## 2013-04-04 ENCOUNTER — Emergency Department (HOSPITAL_COMMUNITY): Payer: Medicare Other

## 2013-04-04 ENCOUNTER — Encounter (HOSPITAL_COMMUNITY): Payer: Self-pay | Admitting: Emergency Medicine

## 2013-04-04 DIAGNOSIS — F411 Generalized anxiety disorder: Secondary | ICD-10-CM | POA: Diagnosis not present

## 2013-04-04 DIAGNOSIS — E7800 Pure hypercholesterolemia, unspecified: Secondary | ICD-10-CM | POA: Insufficient documentation

## 2013-04-04 DIAGNOSIS — Z9889 Other specified postprocedural states: Secondary | ICD-10-CM | POA: Insufficient documentation

## 2013-04-04 DIAGNOSIS — J45909 Unspecified asthma, uncomplicated: Secondary | ICD-10-CM | POA: Diagnosis not present

## 2013-04-04 DIAGNOSIS — Z79899 Other long term (current) drug therapy: Secondary | ICD-10-CM | POA: Insufficient documentation

## 2013-04-04 DIAGNOSIS — Z8701 Personal history of pneumonia (recurrent): Secondary | ICD-10-CM | POA: Diagnosis not present

## 2013-04-04 DIAGNOSIS — F329 Major depressive disorder, single episode, unspecified: Secondary | ICD-10-CM | POA: Diagnosis not present

## 2013-04-04 DIAGNOSIS — Z8739 Personal history of other diseases of the musculoskeletal system and connective tissue: Secondary | ICD-10-CM | POA: Diagnosis not present

## 2013-04-04 DIAGNOSIS — H547 Unspecified visual loss: Secondary | ICD-10-CM | POA: Insufficient documentation

## 2013-04-04 DIAGNOSIS — IMO0002 Reserved for concepts with insufficient information to code with codable children: Secondary | ICD-10-CM | POA: Insufficient documentation

## 2013-04-04 DIAGNOSIS — R51 Headache: Secondary | ICD-10-CM | POA: Insufficient documentation

## 2013-04-04 DIAGNOSIS — E039 Hypothyroidism, unspecified: Secondary | ICD-10-CM | POA: Diagnosis not present

## 2013-04-04 DIAGNOSIS — F3289 Other specified depressive episodes: Secondary | ICD-10-CM | POA: Diagnosis not present

## 2013-04-04 DIAGNOSIS — Z8719 Personal history of other diseases of the digestive system: Secondary | ICD-10-CM | POA: Insufficient documentation

## 2013-04-04 DIAGNOSIS — I252 Old myocardial infarction: Secondary | ICD-10-CM | POA: Insufficient documentation

## 2013-04-04 DIAGNOSIS — I10 Essential (primary) hypertension: Secondary | ICD-10-CM | POA: Diagnosis not present

## 2013-04-04 DIAGNOSIS — Z8619 Personal history of other infectious and parasitic diseases: Secondary | ICD-10-CM | POA: Insufficient documentation

## 2013-04-04 DIAGNOSIS — H5710 Ocular pain, unspecified eye: Secondary | ICD-10-CM | POA: Diagnosis present

## 2013-04-04 DIAGNOSIS — H539 Unspecified visual disturbance: Secondary | ICD-10-CM | POA: Insufficient documentation

## 2013-04-04 DIAGNOSIS — H538 Other visual disturbances: Secondary | ICD-10-CM | POA: Diagnosis not present

## 2013-04-04 LAB — CBC WITH DIFFERENTIAL/PLATELET
Basophils Absolute: 0.1 10*3/uL (ref 0.0–0.1)
Basophils Relative: 1 % (ref 0–1)
Eosinophils Absolute: 0.1 10*3/uL (ref 0.0–0.7)
Eosinophils Relative: 1 % (ref 0–5)
HEMATOCRIT: 40.4 % (ref 36.0–46.0)
HEMOGLOBIN: 12.6 g/dL (ref 12.0–15.0)
LYMPHS ABS: 2.3 10*3/uL (ref 0.7–4.0)
LYMPHS PCT: 26 % (ref 12–46)
MCH: 27.2 pg (ref 26.0–34.0)
MCHC: 31.2 g/dL (ref 30.0–36.0)
MCV: 87.1 fL (ref 78.0–100.0)
MONO ABS: 0.8 10*3/uL (ref 0.1–1.0)
MONOS PCT: 9 % (ref 3–12)
NEUTROS ABS: 5.7 10*3/uL (ref 1.7–7.7)
NEUTROS PCT: 63 % (ref 43–77)
Platelets: 305 10*3/uL (ref 150–400)
RBC: 4.64 MIL/uL (ref 3.87–5.11)
RDW: 15.9 % — ABNORMAL HIGH (ref 11.5–15.5)
WBC: 9.1 10*3/uL (ref 4.0–10.5)

## 2013-04-04 LAB — BASIC METABOLIC PANEL
BUN: 14 mg/dL (ref 6–23)
CO2: 27 meq/L (ref 19–32)
Calcium: 9.5 mg/dL (ref 8.4–10.5)
Chloride: 99 mEq/L (ref 96–112)
Creatinine, Ser: 0.7 mg/dL (ref 0.50–1.10)
GFR calc Af Amer: 86 mL/min — ABNORMAL LOW (ref >90)
GFR calc non Af Amer: 74 mL/min — ABNORMAL LOW (ref >90)
Glucose, Bld: 84 mg/dL (ref 70–99)
POTASSIUM: 4.2 meq/L (ref 3.7–5.3)
SODIUM: 140 meq/L (ref 137–147)

## 2013-04-04 LAB — C-REACTIVE PROTEIN

## 2013-04-04 LAB — SEDIMENTATION RATE: SED RATE: 25 mm/h — AB (ref 0–22)

## 2013-04-04 MED ORDER — FLUORESCEIN SODIUM 1 MG OP STRP
1.0000 | ORAL_STRIP | Freq: Once | OPHTHALMIC | Status: AC
  Administered 2013-04-04: 1 via OPHTHALMIC
  Filled 2013-04-04: qty 1

## 2013-04-04 MED ORDER — TETRACAINE HCL 0.5 % OP SOLN
2.0000 [drp] | Freq: Once | OPHTHALMIC | Status: AC
  Administered 2013-04-04: 2 [drp] via OPHTHALMIC
  Filled 2013-04-04: qty 2

## 2013-04-04 MED ORDER — LORAZEPAM 2 MG/ML IJ SOLN
0.5000 mg | Freq: Once | INTRAMUSCULAR | Status: AC
  Administered 2013-04-04: 0.5 mg via INTRAVENOUS
  Filled 2013-04-04: qty 1

## 2013-04-04 MED ORDER — LORAZEPAM 2 MG/ML IJ SOLN
0.5000 mg | Freq: Once | INTRAMUSCULAR | Status: AC
  Administered 2013-04-04: 0.5 mg via INTRAVENOUS

## 2013-04-04 MED ORDER — ARTIFICIAL TEARS OP OINT
TOPICAL_OINTMENT | OPHTHALMIC | Status: DC | PRN
  Administered 2013-04-04: 16:00:00 via OPHTHALMIC
  Filled 2013-04-04: qty 3.5

## 2013-04-04 NOTE — ED Notes (Signed)
Daughter at desk to report pt is having eye pain and increased anxiety. Will advise nurse.

## 2013-04-04 NOTE — ED Notes (Signed)
Pt sts at 4pm last night pt was having blurry vision and some slurred speech. 0430 called daughter and symptoms had resolved. Pt. Blurry vision intermittent since first episode- pt thought glasses were dirty and cleaned them without relief. At 0600 patient took tylenol #3 and 0.25mg  of xanax for anxiety- pt check BP and it was 186/110.  Pt right eye pain started at 0830- constant 7/10 pain. Worse with eye opening. Pt took 25mg  of metoprolol.  Rt. Eye pain  And blurry vision has not dissipated. Pt endores headache 7/10 dull frontal headache at present time.

## 2013-04-04 NOTE — ED Notes (Signed)
Dr. Belfi at bedside 

## 2013-04-04 NOTE — Discharge Instructions (Signed)
Visual Disturbances  You have had a disturbance in your vision. This may be caused by various conditions, such as:   Migraines. Migraine headaches are often preceded by a disturbance in vision. Blind spots or light flashes are followed by a headache. This type of visual disturbance is temporary. It does not damage the eye.   Glaucoma. This is caused by increased pressure in the eye. Symptoms include haziness, blurred vision, or seeing rainbow colored circles when looking at bright lights. Partial or complete visual loss can occur. You may or may not experience eye pain. Visual loss may be gradual or sudden and is irreversible. Glaucoma is the leading cause of blindness.   Retina problems. Vision will be reduced if the retina becomes detached or if there is a circulation problem as with diabetes, high blood pressure, or a mini-stroke. Symptoms include seeing "floaters," flashes of light, or shadows, as if a curtain has fallen over your eye.   Optic nerve problems. The main nerve in your eye can be damaged by redness, soreness, and swelling (inflammation), poor circulation, drugs, and toxins.  It is very important to have a complete exam done by a specialist to determine the exact cause of your eye problem. The specialist may recommend medicines or surgery, depending on the cause of the problem. This can help prevent further loss of vision or reduce the risk of having a stroke. Contact the caregiver to whom you have been referred and arrange for follow-up care right away.  SEEK IMMEDIATE MEDICAL CARE IF:    Your vision gets worse.   You develop severe headaches.   You have any weakness or numbness in the face, arms, or legs.   You have any trouble speaking or walking.  Document Released: 02/08/2004 Document Revised: 03/25/2011 Document Reviewed: 05/31/2009  [employer] Patient Information 2014 [employer].

## 2013-04-04 NOTE — ED Notes (Signed)
Pt. Stated, i started having eye pain and not being able to see out of my rt eye and then this morning i started having eye pain, headache , and having an anxiety attack.

## 2013-04-04 NOTE — ED Provider Notes (Signed)
CSN: [id number]     Arrival date & time 04/04/13  1015 History   First MD Initiated Contact with Patient 04/04/13 1105     Chief Complaint  Patient presents with  . Eye Pain  . Headache  . Anxiety     (Consider location/radiation/quality/duration/timing/severity/associated sxs/prior Treatment) HPI Comments: Patient presents with eye pain vision loss. She states yesterday at about 4 PM she noticed sudden vision loss in her right eye. She was able to see light but cannot make out any shakes. She states over the left next few hours her vision improved but was still blurry. She states she woke up this morning about 6 AM and noticed that she couldn't see again. She can still see light but no shakes. She also noticed pain in her right eye which has progressed throughout the morning. She has intense pain in the surrounding headache in the right eye. She hasn't noticed any facial drooping or speech deficits. She denies any balance problems. She denies any numbness or weakness in her arms or legs. She denies any history of eye problems in the past. She did have cataract surgery several years ago in [place]. She's had a history of TIAs in the past but says that presented with double vision and speech deficits. She's noticed a little bit of watery discharge that started after the pain started. She's been rubbing her eye and putting ice packs on it.  Patient is a 80 y.o. female presenting with eye pain, headaches, and anxiety.  Eye Pain Associated symptoms include headaches. Pertinent negatives include no chest pain, no abdominal pain and no shortness of breath.  Headache Associated symptoms: eye pain   Associated symptoms: no abdominal pain, no back pain, no congestion, no cough, no diarrhea, no dizziness, no fatigue, no fever, no nausea, no numbness and no vomiting   Anxiety Associated symptoms include headaches. Pertinent negatives include no chest pain, no abdominal pain and no shortness of breath.     Past Medical History  Diagnosis Date  . Bronchiectasis     with history of Legionaires with chronic rales/rhonchi  . Pollen allergies   . Hypertension     Negative renal duplex in December of 2012  . Allergic rhinitis   . Diastolic dysfunction     a. per echo 08/2010 with normal LV function;  b. 06/2011 Echo: EF 65-70%  . Idiopathic peripheral neuropathy   . Hypothyroidism   . Depression   . H/O hiatal hernia   . Osteopenia 02/2011    t score - 2.1  . Hypercholesteremia   . Chest pain at rest   . NSTEMI (non-ST elevated myocardial infarction)     a. Normal coronaries per cath August 2012 and negative CT angio for PE;  b. 06/2011 Repeat admission w/ chest pain and elevated troponin's, CTA Chest w/o PE  . Asthma   . MAC (mycobacterium avium-intracellulare complex)   . Shortness of breath     "sometimes just lying down" (04/15/2012)  . Pneumonia     "recurrent" (04/15/2012)  . H/O Legionnaire's disease 11/1976  . History of blood transfusion 1972    "w/hysterectomy" (04/15/2012)  . GERD (gastroesophageal reflux disease)     "just recently" (04/15/2012)  . Daily headache     "over the last week" 04/15/2012   . Migraines     "outgrew them" (04/15/2012)  . Seizures 1930's    "3-4 as a preteen" (04/15/2012)  . Arthritis     "a little; in my back and  hands" (04/15/2012)  . Anxiety     Anxiety attacks   Past Surgical History  Procedure Laterality Date  . Dilation and curettage of uterus    . Total abdominal hysterectomy w/ bilateral salpingoophorectomy  1972    leiomyomata, menorrhagia  . Appendectomy    . Abdominal hysterectomy  1972  . Cardiac catheterization  August 2012    Normal coronaries.  . Tonsillectomy  1930   Family History  Problem Relation Age of Onset  . Hypertension Mother     stroke  . Stroke Mother   . Cancer Brother     prostrate   History  Substance Use Topics  . Smoking status: Never Smoker   . Smokeless tobacco: Never Used  . Alcohol Use: 1.2 oz/week     2 Glasses of wine per week     Comment: wine 1-2 week   OB History   Grav Para Term Preterm Abortions TAB SAB Ect Mult Living   2 2        2      Review of Systems  Constitutional: Negative for fever, chills, diaphoresis and fatigue.  HENT: Negative for congestion, rhinorrhea and sneezing.   Eyes: Positive for pain and visual disturbance.  Respiratory: Negative for cough, chest tightness and shortness of breath.   Cardiovascular: Negative for chest pain and leg swelling.  Gastrointestinal: Negative for nausea, vomiting, abdominal pain, diarrhea and blood in stool.  Genitourinary: Negative for frequency, hematuria, flank pain and difficulty urinating.  Musculoskeletal: Negative for arthralgias and back pain.  Skin: Negative for rash.  Neurological: Positive for headaches. Negative for dizziness, speech difficulty, weakness and numbness.      Allergies  Hydrocodone; Levofloxacin; Oxycodone; Clarithromycin; and Isosorbide  Home Medications   Current Outpatient Rx  Name  Route  Sig  Dispense  Refill  . acetaminophen-codeine (TYLENOL #3) 300-30 MG per tablet   Oral   Take 1 tablet by mouth 2 (two) times daily as needed for pain (anxiety).          Marland Kitchen. albuterol (PROVENTIL HFA;VENTOLIN HFA) 108 (90 BASE) MCG/ACT inhaler   Inhalation   Inhale 2 puffs into the lungs 2 (two) times daily as needed for wheezing or shortness of breath.         . ALPRAZolam (XANAX) 0.25 MG tablet   Oral   Take 0.25 mg by mouth 2 (two) times daily.         . citalopram (CELEXA) 40 MG tablet   Oral   Take 20 mg by mouth daily.          . clobetasol (TEMOVATE) 0.05 % external solution   Topical   Apply 1 application topically 2 (two) times daily.         Marland Kitchen. estradiol (MINIVELLE) 0.05 MG/24HR patch      PLACE 1 PATCH ONTO THE SKIN ONCE A WEEK.(APPLY ON THURSDAY)   8 patch   11   . FLUOCINOLONE ACETONIDE SCALP 0.01 % OIL   Apply externally   Apply 1 application topically daily.          Marland Kitchen. levothyroxine (SYNTHROID, LEVOTHROID) 50 MCG tablet   Oral   Take 50 mcg by mouth daily.           . metoprolol tartrate (LOPRESSOR) 25 MG tablet   Oral   Take 25 mg by mouth 2 (two) times daily.         . nitroGLYCERIN (NITROSTAT) 0.4 MG SL tablet   Sublingual   Place  0.4 mg under the tongue every 5 (five) minutes as needed for chest pain.         . simvastatin (ZOCOR) 20 MG tablet   Oral   Take 1 tablet (20 mg total) by mouth every evening.   30 tablet   5    BP 168/78  Pulse 72  Temp(Src) 97.9 F (36.6 C) (Oral)  Resp 19  SpO2 94%  LMP 01/14/1970 Physical Exam  Constitutional: She is oriented to person, place, and time. She appears well-developed and well-nourished.  HENT:  Head: Normocephalic and atraumatic.  Eyes: Pupils are equal, round, and reactive to light.  Patient has mild erythema of the right conjunctiva. Extraocular eye movements are intact. There's some slight watery drainage from the eye. Pupils were small but reactive with those direct and consensual response. I am unable to visualize the fundi due to to the very small pupils. Tetracaine and 4 she was placed in the eye and there was no significant areas of staining uptake. Eye pressures were measured in the right eye and noted to be 17 on 2 different measurements. There's no rashes around the eye.  Neck: Normal range of motion. Neck supple.  Cardiovascular: Normal rate, regular rhythm and normal heart sounds.   Pulmonary/Chest: Effort normal and breath sounds normal. No respiratory distress. She has no wheezes. She has no rales. She exhibits no tenderness.  Abdominal: Soft. Bowel sounds are normal. There is no tenderness. There is no rebound and no guarding.  Musculoskeletal: Normal range of motion. She exhibits no edema.  Lymphadenopathy:    She has no cervical adenopathy.  Neurological: She is alert and oriented to person, place, and time.  Patient seems to have some slight drooping of her right  corner of the mouth. She feels that this is chronic for her. She has normal sensation in the face. No other cranial nerve abnormalities. Motor 5 out of 5 all show knees. Sensation grossly intact to light touch all extremities. Finger to nose intact. No pronator drift.  Skin: Skin is warm and dry. No rash noted.  Psychiatric: She has a normal mood and affect.    ED Course  Procedures (including critical care time) Labs Review Results for orders placed during the hospital encounter of 04/04/13  CBC WITH DIFFERENTIAL      Result Value Ref Range   WBC 9.1  4.0 - 10.5 K/uL   RBC 4.64  3.87 - 5.11 MIL/uL   Hemoglobin 12.6  12.0 - 15.0 g/dL   HCT 14.7  82.9 - 56.2 %   MCV 87.1  78.0 - 100.0 fL   MCH 27.2  26.0 - 34.0 pg   MCHC 31.2  30.0 - 36.0 g/dL   RDW 13.0 (*) 86.5 - 78.4 %   Platelets 305  150 - 400 K/uL   Neutrophils Relative % 63  43 - 77 %   Neutro Abs 5.7  1.7 - 7.7 K/uL   Lymphocytes Relative 26  12 - 46 %   Lymphs Abs 2.3  0.7 - 4.0 K/uL   Monocytes Relative 9  3 - 12 %   Monocytes Absolute 0.8  0.1 - 1.0 K/uL   Eosinophils Relative 1  0 - 5 %   Eosinophils Absolute 0.1  0.0 - 0.7 K/uL   Basophils Relative 1  0 - 1 %   Basophils Absolute 0.1  0.0 - 0.1 K/uL  BASIC METABOLIC PANEL      Result Value Ref Range   Sodium  140  137 - 147 mEq/L   Potassium 4.2  3.7 - 5.3 mEq/L   Chloride 99  96 - 112 mEq/L   CO2 27  19 - 32 mEq/L   Glucose, Bld 84  70 - 99 mg/dL   BUN 14  6 - 23 mg/dL   Creatinine, Ser 1.61  0.50 - 1.10 mg/dL   Calcium 9.5  8.4 - 09.6 mg/dL   GFR calc non Af Amer 74 (*) >90 mL/min   GFR calc Af Amer 86 (*) >90 mL/min  SEDIMENTATION RATE      Result Value Ref Range   Sed Rate 25 (*) 0 - 22 mm/hr   No results found.  Imaging Review Ct Head Wo Contrast  04/04/2013   CLINICAL DATA:  Headache, blurred vision, anxiety  EXAM: CT HEAD WITHOUT CONTRAST  TECHNIQUE: Contiguous axial images were obtained from the base of the skull through the vertex without  intravenous contrast.  COMPARISON:  06/25/2012  FINDINGS: No evidence of parenchymal hemorrhage or extra-axial fluid collection. No mass lesion, mass effect, or midline shift.  No CT evidence of acute infarction.  Very mild periventricular small vessel ischemic changes.  Cerebral volume is age appropriate.  No ventriculomegaly.  The visualized paranasal sinuses are essentially clear. The mastoid air cells are unopacified.  No evidence of calvarial fracture.  IMPRESSION: No evidence of acute intracranial abnormality.   Electronically Signed   By: Charline Bills M.D.   On: 04/04/2013 14:32     EKG Interpretation None      MDM   Final diagnoses:  Vision loss    Patient presents with sudden vision loss. She did develop some eye pain this morning. She has no other symptoms that would be more suggestive of a stroke. She does have some right-sided facial drooping which is subtle and she feels that it's unchanged from her baseline. She states ever since she had a TIA in the past she always had a little drooling out of the right corner of her mouth. She has no other neurologic deficits. I spoke with Dr. Rollen Sox who is on call for ophthalmology. He feels that she likely has a vascular insult, either a retinal artery or vein occlusion. He felt that he could see her in the office in the morning. He felt the eye pain was more likely due to dry eye. She does have pain relief when the eye is opened and is worse when she closes the eye. I don't see any foreign bodies. I dispensed her some Lacri-Lube lubricating drops. She will continue taking her Xanax for anxiety and Tylenol with codeine for pain which she has at home. She and her daughter are comfortable with this plan. I also discussed the need for followup with her primary care physician. Depending on what the ophthalmologist findings, she may need ultrasounds of her carotid arteries which can be arranged by her primary care physician. I did order a  sedimentation rate and C-reactive protein for Dr. Rollen Sox.    Rolan Bucco, MD 04/04/13 [phone]

## 2013-04-04 NOTE — ED Notes (Addendum)
Family member given graham crackers, pt given ice compress

## 2013-04-05 DIAGNOSIS — H353190 Nonexudative age-related macular degeneration, unspecified eye, stage unspecified: Secondary | ICD-10-CM | POA: Diagnosis not present

## 2013-04-05 DIAGNOSIS — H40019 Open angle with borderline findings, low risk, unspecified eye: Secondary | ICD-10-CM | POA: Diagnosis not present

## 2013-04-05 DIAGNOSIS — H5319 Other subjective visual disturbances: Secondary | ICD-10-CM | POA: Diagnosis not present

## 2013-04-05 DIAGNOSIS — H35039 Hypertensive retinopathy, unspecified eye: Secondary | ICD-10-CM | POA: Diagnosis not present

## 2013-04-06 ENCOUNTER — Other Ambulatory Visit: Payer: Self-pay | Admitting: Family Medicine

## 2013-04-06 DIAGNOSIS — F411 Generalized anxiety disorder: Secondary | ICD-10-CM | POA: Diagnosis not present

## 2013-04-06 DIAGNOSIS — H3530 Unspecified macular degeneration: Secondary | ICD-10-CM | POA: Diagnosis not present

## 2013-04-06 DIAGNOSIS — H5460 Unqualified visual loss, one eye, unspecified: Secondary | ICD-10-CM | POA: Diagnosis not present

## 2013-04-06 DIAGNOSIS — I10 Essential (primary) hypertension: Secondary | ICD-10-CM | POA: Diagnosis not present

## 2013-04-06 DIAGNOSIS — H35039 Hypertensive retinopathy, unspecified eye: Secondary | ICD-10-CM | POA: Diagnosis not present

## 2013-04-06 DIAGNOSIS — H5461 Unqualified visual loss, right eye, normal vision left eye: Secondary | ICD-10-CM

## 2013-04-06 DIAGNOSIS — G479 Sleep disorder, unspecified: Secondary | ICD-10-CM | POA: Diagnosis not present

## 2013-04-06 DIAGNOSIS — H35369 Drusen (degenerative) of macula, unspecified eye: Secondary | ICD-10-CM | POA: Diagnosis not present

## 2013-04-12 ENCOUNTER — Ambulatory Visit
Admission: RE | Admit: 2013-04-12 | Discharge: 2013-04-12 | Disposition: A | Discharge status: Home / Self Care | Payer: Medicare Other | Source: Ambulatory Visit | Attending: Family Medicine | Admitting: Family Medicine

## 2013-04-12 DIAGNOSIS — I658 Occlusion and stenosis of other precerebral arteries: Secondary | ICD-10-CM | POA: Diagnosis not present

## 2013-04-12 DIAGNOSIS — I6529 Occlusion and stenosis of unspecified carotid artery: Secondary | ICD-10-CM | POA: Diagnosis not present

## 2013-04-12 DIAGNOSIS — H5461 Unqualified visual loss, right eye, normal vision left eye: Secondary | ICD-10-CM

## 2013-04-20 ENCOUNTER — Ambulatory Visit (INDEPENDENT_AMBULATORY_CARE_PROVIDER_SITE_OTHER): Payer: Medicare Other | Admitting: Psychiatry

## 2013-04-20 DIAGNOSIS — Z630 Problems in relationship with spouse or partner: Secondary | ICD-10-CM

## 2013-04-20 DIAGNOSIS — F410 Panic disorder [episodic paroxysmal anxiety] without agoraphobia: Secondary | ICD-10-CM

## 2013-04-20 DIAGNOSIS — Z7189 Other specified counseling: Secondary | ICD-10-CM | POA: Diagnosis not present

## 2013-05-03 DIAGNOSIS — F339 Major depressive disorder, recurrent, unspecified: Secondary | ICD-10-CM | POA: Diagnosis not present

## 2013-05-03 DIAGNOSIS — F6389 Other impulse disorders: Secondary | ICD-10-CM | POA: Diagnosis not present

## 2013-05-03 DIAGNOSIS — H35369 Drusen (degenerative) of macula, unspecified eye: Secondary | ICD-10-CM | POA: Diagnosis not present

## 2013-05-03 DIAGNOSIS — F410 Panic disorder [episodic paroxysmal anxiety] without agoraphobia: Secondary | ICD-10-CM | POA: Diagnosis not present

## 2013-05-04 ENCOUNTER — Ambulatory Visit (INDEPENDENT_AMBULATORY_CARE_PROVIDER_SITE_OTHER): Payer: Medicare Other | Admitting: Psychiatry

## 2013-05-04 DIAGNOSIS — Z7189 Other specified counseling: Secondary | ICD-10-CM | POA: Diagnosis not present

## 2013-05-04 DIAGNOSIS — Z630 Problems in relationship with spouse or partner: Secondary | ICD-10-CM

## 2013-05-04 DIAGNOSIS — F410 Panic disorder [episodic paroxysmal anxiety] without agoraphobia: Secondary | ICD-10-CM | POA: Diagnosis not present

## 2013-05-18 ENCOUNTER — Ambulatory Visit (INDEPENDENT_AMBULATORY_CARE_PROVIDER_SITE_OTHER): Payer: Medicare Other | Admitting: Psychiatry

## 2013-05-18 DIAGNOSIS — F410 Panic disorder [episodic paroxysmal anxiety] without agoraphobia: Secondary | ICD-10-CM

## 2013-05-18 DIAGNOSIS — Z630 Problems in relationship with spouse or partner: Secondary | ICD-10-CM

## 2013-05-27 ENCOUNTER — Ambulatory Visit (INDEPENDENT_AMBULATORY_CARE_PROVIDER_SITE_OTHER): Payer: Medicare Other | Admitting: Psychiatry

## 2013-05-27 DIAGNOSIS — Z630 Problems in relationship with spouse or partner: Secondary | ICD-10-CM

## 2013-05-27 DIAGNOSIS — Z7189 Other specified counseling: Secondary | ICD-10-CM

## 2013-05-27 DIAGNOSIS — F410 Panic disorder [episodic paroxysmal anxiety] without agoraphobia: Secondary | ICD-10-CM | POA: Diagnosis not present

## 2013-06-10 ENCOUNTER — Ambulatory Visit (INDEPENDENT_AMBULATORY_CARE_PROVIDER_SITE_OTHER): Payer: Medicare Other | Admitting: Psychiatry

## 2013-06-10 DIAGNOSIS — Z630 Problems in relationship with spouse or partner: Secondary | ICD-10-CM | POA: Diagnosis not present

## 2013-06-10 DIAGNOSIS — F410 Panic disorder [episodic paroxysmal anxiety] without agoraphobia: Secondary | ICD-10-CM | POA: Diagnosis not present

## 2013-06-14 ENCOUNTER — Ambulatory Visit (HOSPITAL_COMMUNITY): Payer: Medicare Other | Admitting: Psychiatry

## 2013-06-15 ENCOUNTER — Encounter: Payer: Self-pay | Admitting: Cardiovascular Disease

## 2013-06-15 ENCOUNTER — Ambulatory Visit (INDEPENDENT_AMBULATORY_CARE_PROVIDER_SITE_OTHER): Payer: Medicare Other | Admitting: Cardiovascular Disease

## 2013-06-15 VITALS — BP 140/90 | HR 58 | Ht 67.0 in | Wt 123.0 lb

## 2013-06-15 DIAGNOSIS — R079 Chest pain, unspecified: Secondary | ICD-10-CM

## 2013-06-15 DIAGNOSIS — I10 Essential (primary) hypertension: Secondary | ICD-10-CM | POA: Diagnosis not present

## 2013-06-15 NOTE — Patient Instructions (Signed)
Your physician recommends that you continue on your current medications as directed. Please refer to the Current Medication list given to you today.  Your physician wants you to follow-up in: 1 year with Dr. Nahser.  You will receive a reminder letter in the mail two months in advance. If you don't receive a letter, please call our office to schedule the follow-up appointment.  

## 2013-06-15 NOTE — Progress Notes (Signed)
Jama FlavorsGertrude Strang Date of Birth: 02/24/1921 Medical Record #[id number]  Problem list: 1. Hypertension 2. Asthma 3. Depression 4. Coronary artery disease-status post non-ST segment elevation myocardial infarction. Cardiac cath revealed nonobstructive coronary artery disease 5. Bronchiectasis / atypical mycobacterium infection   History of Present Illness: [name] is seen back today following a recent hospitalization. She was admitted to the hospital with chest tightness. She ruled out for myocardial infarction. She has a history of chest pain in the past and a cardiac catheterization in 2012 was unremarkable.  She originally called EMS when her BP was 220/110  She has had significant difficulty controlling her BP recently.   She has been watching her salt.  She has taken amlodipine and Lisinopril in the past but these have been stopped because she seemed to be overmedicated.    She continues to have intermittent episodes of chest pressure and heaviness.  These are typically associated with marked hypertension although it is not clear whether the hypertension occurs first or as a result of her  feeling poorly.  Jun 11, 2012:  Mrs. Sarah [name] was seen by Norma FredricksonLori Gerhardt in February, 2014. She had been measuring her blood pressure at least 10 times every day and was worried because her blood pressure seemed to be somewhat labile.  She feels poorly today.  She did not sleep well last night.  She had a headache.   She has lots of anxiety issues.  She takes xanax with some relief.    June 2 , 2015:  [name] is doing ok.  Upset about having to put her husband in an memory care SNF.  Lots of guilt about that.    Has a home sitter with her today. No significant CP or dyspnea.  She has some anxiety - has attacks on occasion.  Her anxiety seems to start in her chest as a CP.  If she takes the xanax and Tylenol #3 in time, she can avoid the panic attacks.    She thinks she had 3 TIAs this past year.   Carotid duplex was negative.  MRI was normal.    She has been on 1 ASA 325 mg a day.    ASA 325 mg a day Current Outpatient Prescriptions on File Prior to Visit  Medication Sig Dispense Refill  . acetaminophen-codeine (TYLENOL #3) 300-30 MG per tablet Take 1 tablet by mouth 4 (four) times daily as needed (anxiety).       Marland Kitchen. albuterol (PROVENTIL HFA;VENTOLIN HFA) 108 (90 BASE) MCG/ACT inhaler Inhale 2 puffs into the lungs 2 (two) times daily as needed for wheezing or shortness of breath.      . ALPRAZolam (XANAX) 0.25 MG tablet Take 0.25 mg by mouth 4 (four) times daily as needed.       . citalopram (CELEXA) 40 MG tablet Take 20 mg by mouth daily.       . clobetasol (TEMOVATE) 0.05 % external solution Apply 1 application topically 2 (two) times daily.      Marland Kitchen. estradiol (MINIVELLE) 0.05 MG/24HR patch PLACE 1 PATCH ONTO THE SKIN ONCE A WEEK.(APPLY ON THURSDAY)  8 patch  11  . FLUOCINOLONE ACETONIDE SCALP 0.01 % OIL Apply 1 application topically daily.      Marland Kitchen. levothyroxine (SYNTHROID, LEVOTHROID) 50 MCG tablet Take 50 mcg by mouth daily.        . metoprolol tartrate (LOPRESSOR) 25 MG tablet Take 12.5 mg by mouth 2 (two) times daily.       .Marland Kitchen  nitroGLYCERIN (NITROSTAT) 0.4 MG SL tablet Place 0.4 mg under the tongue every 5 (five) minutes as needed for chest pain.      . simvastatin (ZOCOR) 20 MG tablet Take 1 tablet (20 mg total) by mouth every evening.  30 tablet  5   No current facility-administered medications on file prior to visit.    Allergies  Allergen Reactions  . Hydrocodone Nausea And Vomiting and Other (See Comments)    Passed out, threw up, blood pressure dropped  . Levofloxacin Other (See Comments)    Pass out, vomiting  . Oxycodone Other (See Comments)    Rapid heart beat  . Clarithromycin Other (See Comments)    Vomiting  . Isosorbide Other (See Comments)    Heart rate drops    Past Medical History  Diagnosis Date  . Bronchiectasis     with history of Legionaires with  chronic rales/rhonchi  . Pollen allergies   . Hypertension     Negative renal duplex in December of 2012  . Allergic rhinitis   . Diastolic dysfunction     a. per echo 08/2010 with normal LV function;  b. 06/2011 Echo: EF 65-70%  . Idiopathic peripheral neuropathy   . Hypothyroidism   . Depression   . H/O hiatal hernia   . Osteopenia 02/2011    t score - 2.1  . Hypercholesteremia   . Chest pain at rest   . NSTEMI (non-ST elevated myocardial infarction)     a. Normal coronaries per cath August 2012 and negative CT angio for PE;  b. 06/2011 Repeat admission w/ chest pain and elevated troponin's, CTA Chest w/o PE  . Asthma   . MAC (mycobacterium avium-intracellulare complex)   . Shortness of breath     "sometimes just lying down" (04/15/2012)  . Pneumonia     "recurrent" (04/15/2012)  . H/O Legionnaire's disease 11/1976  . History of blood transfusion 1972    "w/hysterectomy" (04/15/2012)  . GERD (gastroesophageal reflux disease)     "just recently" (04/15/2012)  . Daily headache     "over the last week" 04/15/2012   . Migraines     "outgrew them" (04/15/2012)  . Seizures 1930's    "3-4 as a preteen" (04/15/2012)  . Arthritis     "a little; in my back and hands" (04/15/2012)  . Anxiety     Anxiety attacks    Past Surgical History  Procedure Laterality Date  . Dilation and curettage of uterus    . Total abdominal hysterectomy w/ bilateral salpingoophorectomy  1972    leiomyomata, menorrhagia  . Appendectomy    . Abdominal hysterectomy  1972  . Cardiac catheterization  August 2012    Normal coronaries.  . Tonsillectomy  1930    History  Smoking status  . Never Smoker   Smokeless tobacco  . Never Used    History  Alcohol Use  . 1.2 oz/week  . 2 Glasses of wine per week    Comment: wine 1-2 week    Family History  Problem Relation Age of Onset  . Hypertension Mother     stroke  . Stroke Mother   . Cancer Brother     prostrate    Review of Systems: The review of  systems is per the HPI.  All other systems were reviewed and are negative.  Physical Exam: BP 140/90  Pulse 58  Ht 5\' 7"  (1.702 m)  Wt 123 lb (55.792 kg)  BMI 19.26 kg/m2  SpO2 94%  LMP 01/14/1970 Patient is very pleasant and in no acute distress. Skin is warm and dry. Color is normal.  HEENT is unremarkable. Normocephalic/atraumatic. PERRL. Sclera are nonicteric. Neck is supple. No masses. No JVD. Lungs show some persistant rales which are chronic. Cardiac exam shows a regular rate and rhythm. Abdomen is soft. Extremities are without edema. Gait and ROM are intact. No gross neurologic deficits noted.  LABORATORY DATA:  Lab Results  Component Value Date   WBC 9.1 04/04/2013   HGB 12.6 04/04/2013   HCT 40.4 04/04/2013   PLT 305 04/04/2013   GLUCOSE 84 04/04/2013   CHOL 143 10/29/2011   TRIG 63.0 10/29/2011   HDL 65.40 10/29/2011   LDLCALC 65 10/29/2011   ALT 10 11/23/2011   AST 24 11/23/2011   NA 140 04/04/2013   K 4.2 04/04/2013   CL 99 04/04/2013   CREATININE 0.70 04/04/2013   BUN 14 04/04/2013   CO2 27 04/04/2013   TSH 3.321 11/23/2011   INR 1.00 06/19/2011   HGBA1C 6.1* 09/06/2010   ECG: 06/11/2012: Sinus bradycardia at 58 beats a minute. She has no ST or T wave changes.  Assessment / Plan:

## 2013-06-15 NOTE — Assessment & Plan Note (Signed)
No CP. \ Continue current meds  

## 2013-06-15 NOTE — Assessment & Plan Note (Signed)
Her BP is ok.  .  Continue current meds

## 2013-06-22 ENCOUNTER — Ambulatory Visit (INDEPENDENT_AMBULATORY_CARE_PROVIDER_SITE_OTHER): Payer: Medicare Other | Admitting: Psychiatry

## 2013-06-22 DIAGNOSIS — Z630 Problems in relationship with spouse or partner: Secondary | ICD-10-CM | POA: Diagnosis not present

## 2013-06-22 DIAGNOSIS — Z7189 Other specified counseling: Secondary | ICD-10-CM | POA: Diagnosis not present

## 2013-06-22 DIAGNOSIS — F410 Panic disorder [episodic paroxysmal anxiety] without agoraphobia: Secondary | ICD-10-CM | POA: Diagnosis not present

## 2013-07-05 DIAGNOSIS — IMO0002 Reserved for concepts with insufficient information to code with codable children: Secondary | ICD-10-CM | POA: Diagnosis not present

## 2013-07-05 DIAGNOSIS — E039 Hypothyroidism, unspecified: Secondary | ICD-10-CM | POA: Diagnosis not present

## 2013-07-05 DIAGNOSIS — L719 Rosacea, unspecified: Secondary | ICD-10-CM | POA: Diagnosis not present

## 2013-07-05 DIAGNOSIS — Z79899 Other long term (current) drug therapy: Secondary | ICD-10-CM | POA: Diagnosis not present

## 2013-07-13 DIAGNOSIS — Z961 Presence of intraocular lens: Secondary | ICD-10-CM | POA: Diagnosis not present

## 2013-07-13 DIAGNOSIS — H353190 Nonexudative age-related macular degeneration, unspecified eye, stage unspecified: Secondary | ICD-10-CM | POA: Diagnosis not present

## 2013-07-22 ENCOUNTER — Ambulatory Visit (INDEPENDENT_AMBULATORY_CARE_PROVIDER_SITE_OTHER): Payer: Medicare Other | Admitting: Psychiatry

## 2013-07-22 DIAGNOSIS — Z630 Problems in relationship with spouse or partner: Secondary | ICD-10-CM

## 2013-07-22 DIAGNOSIS — Z7189 Other specified counseling: Secondary | ICD-10-CM

## 2013-07-22 DIAGNOSIS — F410 Panic disorder [episodic paroxysmal anxiety] without agoraphobia: Secondary | ICD-10-CM

## 2013-07-26 DIAGNOSIS — H903 Sensorineural hearing loss, bilateral: Secondary | ICD-10-CM | POA: Diagnosis not present

## 2013-08-10 ENCOUNTER — Ambulatory Visit (INDEPENDENT_AMBULATORY_CARE_PROVIDER_SITE_OTHER): Payer: Medicare Other | Admitting: Psychiatry

## 2013-08-10 DIAGNOSIS — Z630 Problems in relationship with spouse or partner: Secondary | ICD-10-CM

## 2013-08-10 DIAGNOSIS — F410 Panic disorder [episodic paroxysmal anxiety] without agoraphobia: Secondary | ICD-10-CM | POA: Diagnosis not present

## 2013-08-14 ENCOUNTER — Other Ambulatory Visit (HOSPITAL_COMMUNITY): Payer: Self-pay | Admitting: Cardiovascular Disease

## 2013-08-19 DIAGNOSIS — F329 Major depressive disorder, single episode, unspecified: Secondary | ICD-10-CM | POA: Diagnosis not present

## 2013-08-19 DIAGNOSIS — L719 Rosacea, unspecified: Secondary | ICD-10-CM | POA: Diagnosis not present

## 2013-08-24 ENCOUNTER — Ambulatory Visit (INDEPENDENT_AMBULATORY_CARE_PROVIDER_SITE_OTHER): Payer: Medicare Other | Admitting: Psychiatry

## 2013-08-24 DIAGNOSIS — Z7189 Other specified counseling: Secondary | ICD-10-CM | POA: Diagnosis not present

## 2013-08-24 DIAGNOSIS — Z630 Problems in relationship with spouse or partner: Secondary | ICD-10-CM

## 2013-08-24 DIAGNOSIS — F410 Panic disorder [episodic paroxysmal anxiety] without agoraphobia: Secondary | ICD-10-CM | POA: Diagnosis not present

## 2013-09-02 DIAGNOSIS — J479 Bronchiectasis, uncomplicated: Secondary | ICD-10-CM | POA: Diagnosis not present

## 2013-09-02 DIAGNOSIS — IMO0002 Reserved for concepts with insufficient information to code with codable children: Secondary | ICD-10-CM | POA: Diagnosis not present

## 2013-09-02 DIAGNOSIS — R51 Headache: Secondary | ICD-10-CM | POA: Diagnosis not present

## 2013-09-03 ENCOUNTER — Encounter: Payer: Self-pay | Admitting: Emergency Medicine

## 2013-09-03 ENCOUNTER — Ambulatory Visit (INDEPENDENT_AMBULATORY_CARE_PROVIDER_SITE_OTHER): Payer: Medicare Other | Admitting: Emergency Medicine

## 2013-09-03 VITALS — BP 124/68 | HR 78 | Ht 67.0 in | Wt 121.0 lb

## 2013-09-03 DIAGNOSIS — J471 Bronchiectasis with (acute) exacerbation: Secondary | ICD-10-CM | POA: Diagnosis not present

## 2013-09-03 MED ORDER — AZITHROMYCIN 250 MG PO TABS: ORAL_TABLET | ORAL | Status: DC

## 2013-09-03 NOTE — Assessment & Plan Note (Signed)
With an apparent acute flare. She does not tolerate quinolones or clarithromycin so will try to treat w azithromycin F/u next week to insure no decline

## 2013-09-03 NOTE — Patient Instructions (Signed)
We will treat you for a flare of bronchitis / bronchiectasis with azithromycin.  Follow next week with T. Parrett in our office Follow with Dr Delton CoombesByrum in 2 months or sooner if you have any problems.

## 2013-09-03 NOTE — Progress Notes (Signed)
80 yo never smoker, hx remote Legionella PNA, diagnosed with suspected asthma about 10 years ago. PFT's 7/10 with mixed disease, mild AFL. Formerly treated with Advair and albuterol, stopped when she moved to [place] in 2008. Stayed on Singulair. At original consult we d/c'd ACE-I. Started on Symbicort.   ROV 11/11/11 --  hx bronchiectasis, chronic coughing, CAD. CT scan consistent w MAIC. We have discussed empiric MAIC therapy, but decided to defer for now. She tells me that she was treated for bronchitis recently with abx + pred. Last time we added symbicort, she feels that it is helping her >> her cough is better.   ROV 03/10/12 -- hx bronchiectasis, chronic coughing, CAD. CT scan consistent w MAIC. She has had trouble since last visit, she has been dealing w panic attacks - some better on xanax. She has had some scratchy throat, hoarse voice. Her cough has overall been better. She remains on symbicort, uses albuterol 1-2x a day. Feels "bad" sometimes, weak, fatigued. ? Whether this relates to her hx MAIC. Remains on lisinopril.  ROV 06/03/12 -- bronchiectasis, chronic coughing, CAD. CT scan consistent w MAIC. She was admitted beginning of April for bronchitis and then severe GERD and CP (? Due to the prednisone). Currently sx are minimal. Very little cough. She stopped symbicort temporarily due to thrush - back on it now.   ROV 07/15/12 -- bronchiectasis, chronic coughing, CAD. CT scan consistent w MAIC. Tried stopping Symbicort > seems to have tolerated. Minimal cough. Using albuterol rarely She has anxiety attacks, has been having more trouble last few days.   ROV 02/05/13 -- bronchiectasis, chronic coughing, CAD. CT scan consistent w MAIC. She just had an AE of her bronchiectasis, some green sputum with blood tinges. Was treated with azithro and pred.  She is on albuterol prn  ROV 09/03/13  -- pleasant woman with bronchiectasis, CAD, ? MAIC based on CT chest. She has help at home, her husband was recently  admitted to SNF and it is very difficult for her. Her citalopram was changed to zoloft 2 weeks ago. She reports 10 days of HA, dizziness. She has been coughing more - small bits of dark mucous. No hemoptysis. No fever  Filed Vitals:   09/03/13 1340  BP: 124/68  Pulse: 78  Height: 5\' 7"  (1.702 m)  Weight: 121 lb (54.885 kg)  SpO2: 96%   Gen: Pleasant, thin, in no distress, normal affect  ENT: No lesions,  mouth clear,  oropharynx clear, no postnasal drip  Neck: No JVD, no TMG, no carotid bruits  Lungs: No use of accessory muscles, scattered insp crackles L>>R  Cardiovascular: RRR, heart sounds normal, no murmur or gallops, no peripheral edema  Musculoskeletal: No deformities, no cyanosis or clubbing  Neuro: alert, non focal  Skin: Warm, no lesions or rashes  03/10/12 --  Comparison: 11/23/2011.  Findings: Mediastinal lymph nodes measure up to 12 mm in the  precarinal station, as before. Hilar regions are difficult to  definitively evaluate without IV contrast. No axillary adenopathy.  Heart is mildly enlarged. No pericardial effusion. Moderate  hiatal hernia.  Trace amount of loculated pleural fluid is seen in the posterior  medial inferior left hemithorax. Mild biapical pleural parenchymal  scarring. Interval progression of bibasilar predominant  peribronchovascular nodularity, areas of airspace consolidation and  scattered bronchiectasis. Airway is otherwise unremarkable.  Incidental imaging of the upper abdomen shows no acute findings.  No worrisome lytic or sclerotic lesions. Degenerative changes are  seen in the spine.  IMPRESSION:  1. Interval progression of patchy bibasilar predominant  peribronchovascular nodularity, consolidation and bronchiectasis.  Findings are suggestive of mycobacterium avium complex (MAC).  2. Tiny loculated left pleural fluid collection.     BRONCHIECTASIS With an apparent acute flare. She does not tolerate quinolones or clarithromycin  so will try to treat w azithromycin F/u next week to insure no decline

## 2013-09-10 ENCOUNTER — Ambulatory Visit: Payer: Medicare Other | Admitting: Adult Health

## 2013-09-15 ENCOUNTER — Ambulatory Visit: Payer: Medicare Other | Admitting: Psychiatry

## 2013-09-16 ENCOUNTER — Ambulatory Visit (INDEPENDENT_AMBULATORY_CARE_PROVIDER_SITE_OTHER): Payer: Medicare Other | Admitting: Psychiatry

## 2013-09-16 DIAGNOSIS — Z630 Problems in relationship with spouse or partner: Secondary | ICD-10-CM

## 2013-09-16 DIAGNOSIS — F410 Panic disorder [episodic paroxysmal anxiety] without agoraphobia: Secondary | ICD-10-CM | POA: Diagnosis not present

## 2013-09-16 DIAGNOSIS — Z7189 Other specified counseling: Secondary | ICD-10-CM | POA: Diagnosis not present

## 2013-09-17 ENCOUNTER — Encounter: Payer: Self-pay | Admitting: Adult Health

## 2013-09-17 ENCOUNTER — Ambulatory Visit (INDEPENDENT_AMBULATORY_CARE_PROVIDER_SITE_OTHER): Payer: Medicare Other | Admitting: Adult Health

## 2013-09-17 VITALS — BP 122/66 | HR 78 | Temp 97.5°F | Ht 67.0 in | Wt 121.0 lb

## 2013-09-17 DIAGNOSIS — Z23 Encounter for immunization: Secondary | ICD-10-CM | POA: Diagnosis not present

## 2013-09-17 DIAGNOSIS — J471 Bronchiectasis with (acute) exacerbation: Secondary | ICD-10-CM | POA: Diagnosis not present

## 2013-09-17 NOTE — Patient Instructions (Signed)
Flu shot today .  Use your albuterol as needed for shortness of breath or cough Follow with Dr Delton Coombes in 3 months or sooner if you have any problems.

## 2013-09-17 NOTE — Assessment & Plan Note (Signed)
Recent flare now resolved   Plan  Flu shot today .  Use your albuterol as needed for shortness of breath or cough Follow with Dr Delton Coombes in 3 months or sooner if you have any problems.

## 2013-09-17 NOTE — Progress Notes (Signed)
80 yo never smoker, hx remote Legionella PNA, diagnosed with suspected asthma about 10 years ago. PFT's 7/10 with mixed disease, mild AFL. Formerly treated with Advair and albuterol, stopped when she moved to [place] in 2008. Stayed on Singulair. At original consult we d/c'd ACE-I. Started on Symbicort.   ROV 11/11/11 --  hx bronchiectasis, chronic coughing, CAD. CT scan consistent w MAIC. We have discussed empiric MAIC therapy, but decided to defer for now. She tells me that she was treated for bronchitis recently with abx + pred. Last time we added symbicort, she feels that it is helping her >> her cough is better.   ROV 03/10/12 -- hx bronchiectasis, chronic coughing, CAD. CT scan consistent w MAIC. She has had trouble since last visit, she has been dealing w panic attacks - some better on xanax. She has had some scratchy throat, hoarse voice. Her cough has overall been better. She remains on symbicort, uses albuterol 1-2x a day. Feels "bad" sometimes, weak, fatigued. ? Whether this relates to her hx MAIC. Remains on lisinopril.  ROV 06/03/12 -- bronchiectasis, chronic coughing, CAD. CT scan consistent w [facility]IC. She was admitted beginning of April for bronchitis and then severe GERD and CP (? Due to the prednisone). Currently sx are minimal. Very little cough. She stopped symbicort temporarily due to thrush - back on it now.   ROV 07/15/12 -- bronchiectasis, chronic coughing, CAD. CT scan consistent w MAIC. Tried stopping Symbicort > seems to have tolerated. Minimal cough. Using albuterol rarely She has anxiety attacks, has been having more trouble last few days.   ROV 02/05/13 -- bronchiectasis, chronic coughing, CAD. CT scan consistent w MAIC. She just had an AE of her bronchiectasis, some green sputum with blood tinges. Was treated with azithro and pred.  She is on albuterol prn  ROV 09/03/13  -- pleasant woman with bronchiectasis, CAD, ? [facility]IC based on CT chest. She has help at home, her husband was recently  admitted to SNF and it is very difficult for her. Her citalopram was changed to zoloft 2 weeks ago. She reports 10 days of HA, dizziness. She has been coughing more - small bits of dark mucous. No hemoptysis. No fever   ROV 09/17/13 Bronchiectasis , CAD, ?MAIC based on CT  Returns for  1 week follow up for recent Bronchiectatic flare Tx w/ Zpack . Feeling much better.  Reports breathing is back to baseline.  no new complaints.  Filed Vitals:   09/17/13 1451  BP: 122/66  Pulse: 78  Temp: 97.5 F (36.4 C)  TempSrc: Oral  Height:  (1.702 m)  Weight: 121 lb (54.885 kg)  SpO2: 94%   Gen: Pleasant, thin, in no distress, normal affect  ENT: No lesions,  mouth clear,  oropharynx clear, no postnasal drip  Neck: No JVD, no TMG, no carotid bruits  Lungs: No use of accessory muscles, few scattered crackles, no wheezing   Cardiovascular: RRR, heart sounds normal, no murmur or gallops, no peripheral edema  Musculoskeletal: No deformities, no cyanosis or clubbing  Neuro: alert, non focal  Skin: Warm, no lesions or rashes  03/10/12 --  Comparison: 11/23/2011.  Findings: Mediastinal lymph nodes measure up to 12 mm in the  precarinal station, as before. Hilar regions are difficult to  definitively evaluate without IV contrast. No axillary adenopathy.  Heart is mildly enlarged. No pericardial effusion. Moderate  hiatal hernia.  Trace amount of loculated pleural fluid is seen in the posterior  medial inferior left hemithorax.  Mild biapical pleural parenchymal  scarring. Interval progression of bibasilar predominant  peribronchovascular nodularity, areas of airspace consolidation and  scattered bronchiectasis. Airway is otherwise unremarkable.  Incidental imaging of the upper abdomen shows no acute findings.  No worrisome lytic or sclerotic lesions. Degenerative changes are  seen in the spine.  IMPRESSION:  1. Interval progression of patchy bibasilar predominant  peribronchovascular  nodularity, consolidation and bronchiectasis.  Findings are suggestive of mycobacterium avium complex (MAC).  2. Tiny loculated left pleural fluid collection.     No problem-specific assessment & plan notes found for this encounter.

## 2013-09-28 ENCOUNTER — Other Ambulatory Visit: Payer: Self-pay | Admitting: Cardiovascular Disease

## 2013-10-05 ENCOUNTER — Ambulatory Visit (INDEPENDENT_AMBULATORY_CARE_PROVIDER_SITE_OTHER): Payer: Medicare Other | Admitting: Psychiatry

## 2013-10-05 DIAGNOSIS — F410 Panic disorder [episodic paroxysmal anxiety] without agoraphobia: Secondary | ICD-10-CM | POA: Diagnosis not present

## 2013-10-05 DIAGNOSIS — Z630 Problems in relationship with spouse or partner: Secondary | ICD-10-CM | POA: Diagnosis not present

## 2013-10-14 ENCOUNTER — Telehealth: Payer: Self-pay | Admitting: Emergency Medicine

## 2013-10-14 NOTE — Telephone Encounter (Signed)
Pt needed an appt to see TP for cough, Nothing further needed at this time.

## 2013-10-15 ENCOUNTER — Ambulatory Visit: Payer: Medicare Other | Admitting: Adult Health

## 2013-10-15 ENCOUNTER — Encounter: Payer: Self-pay | Admitting: Adult Health

## 2013-10-15 ENCOUNTER — Ambulatory Visit (INDEPENDENT_AMBULATORY_CARE_PROVIDER_SITE_OTHER): Payer: Medicare Other | Admitting: Adult Health

## 2013-10-15 ENCOUNTER — Ambulatory Visit (INDEPENDENT_AMBULATORY_CARE_PROVIDER_SITE_OTHER)
Admission: RE | Admit: 2013-10-15 | Discharge: 2013-10-15 | Disposition: A | Discharge status: Home / Self Care | Payer: Medicare Other | Source: Ambulatory Visit | Attending: Adult Health | Admitting: Adult Health

## 2013-10-15 VITALS — BP 108/62 | HR 58 | Temp 97.5°F | Ht 67.0 in | Wt 124.4 lb

## 2013-10-15 DIAGNOSIS — R05 Cough: Secondary | ICD-10-CM

## 2013-10-15 DIAGNOSIS — J471 Bronchiectasis with (acute) exacerbation: Secondary | ICD-10-CM

## 2013-10-15 DIAGNOSIS — J479 Bronchiectasis, uncomplicated: Secondary | ICD-10-CM | POA: Diagnosis not present

## 2013-10-15 DIAGNOSIS — R059 Cough, unspecified: Secondary | ICD-10-CM

## 2013-10-15 DIAGNOSIS — I517 Cardiomegaly: Secondary | ICD-10-CM | POA: Diagnosis not present

## 2013-10-15 MED ORDER — AZITHROMYCIN 250 MG PO TABS: ORAL_TABLET | ORAL | Status: AC

## 2013-10-15 NOTE — Patient Instructions (Signed)
Z-Pack take as directed  Mucinex DM Twice daily  As needed  Cough/congestion  Chest xray today .  Follow up.Dr. Byrum as planned and As needed   Please contact office for sooner follow up if symptoms do not improve or worsen or seek emergency care   

## 2013-10-15 NOTE — Progress Notes (Signed)
Subjective:    Patient ID: Sarah [name], female    DOB: 1921-05-10, 80 y.o.   MRN: [id number]  HPI 80 yo never smoker, hx remote Legionella PNA, diagnosed with suspected asthma about 10 years ago. PFT's 7/10 with mixed disease, mild AFL. Formerly treated with Advair and albuterol, stopped when she moved to [place] in 2008. Stayed on Singulair. At original consult we d/c'd ACE-I. Started on Symbicort.   ROV 11/11/11 --  hx bronchiectasis, chronic coughing, CAD. CT scan consistent w [facility]. We have discussed empiric MAIC therapy, but decided to defer for now. She tells me that she was treated for bronchitis recently with abx + pred. Last time we added symbicort, she feels that it is helping her >> her cough is better.   ROV 03/10/12 -- hx bronchiectasis, chronic coughing, CAD. CT scan consistent w MAIC. She has had trouble since last visit, she has been dealing w panic attacks - some better on xanax. She has had some scratchy throat, hoarse voice. Her cough has overall been better. She remains on symbicort, uses albuterol 1-2x a day. Feels "bad" sometimes, weak, fatigued. ? Whether this relates to her hx MAIC. Remains on lisinopril.  ROV 06/03/12 -- bronchiectasis, chronic coughing, CAD. CT scan consistent w MAIC. She was admitted beginning of April for bronchitis and then severe GERD and CP (? Due to the prednisone). Currently sx are minimal. Very little cough. She stopped symbicort temporarily due to thrush - back on it now.   ROV 07/15/12 -- bronchiectasis, chronic coughing, CAD. CT scan consistent w MAIC. Tried stopping Symbicort > seems to have tolerated. Minimal cough. Using albuterol rarely She has anxiety attacks, has been having more trouble last few days.   ROV 02/05/13 -- bronchiectasis, chronic coughing, CAD. CT scan consistent w MAIC. She just had an AE of her bronchiectasis, some green sputum with blood tinges. Was treated with azithro and pred.  She is on albuterol prn  ROV 09/03/13  --  pleasant woman with bronchiectasis, CAD, ? [facility] based on CT chest. She has help at home, her husband was recently admitted to SNF and it is very difficult for her. Her citalopram was changed to zoloft 2 weeks ago. She reports 10 days of HA, dizziness. She has been coughing more - small bits of dark mucous. No hemoptysis. No fever   ROV 09/17/13 Bronchiectasis , CAD, ?[facility] based on CT  Returns for  1 week follow up for recent Bronchiectatic flare Tx w/ Zpack . Feeling much better.  Reports breathing is back to baseline.  no new complaints.  10/15/2013 Acute OV  Complains of 2 week of productive cough with thick yellow white mucus . Was doing well until weather changed with rains/storms. No fever, orthopnea, edema or n/v/d.  Concerned cough and congestion are going to get worse.  Requests zpack , says it helped so much last ov.   Review of Systems Constitutional:   No  weight loss, night sweats,  Fevers, chills,  +fatigue, or  lassitude.  HEENT:   No headaches,  Difficulty swallowing,  Tooth/dental problems, or  Sore throat,                No sneezing, itching, ear ache,  +nasal congestion, post nasal drip,   CV:  No chest pain,  Orthopnea, PND, swelling in lower extremities, anasarca, dizziness, palpitations, syncope.   GI  No heartburn, indigestion, abdominal pain, nausea, vomiting, diarrhea, change in bowel habits, loss of appetite, bloody stools.  Resp:  No chest wall deformity  Skin: no rash or lesions.  GU: no dysuria, change in color of urine, no urgency or frequency.  No flank pain, no hematuria   MS:  No joint pain or swelling.  No decreased range of motion.  No back pain.  Psych:  No change in mood or affect. No depression or anxiety.  No memory loss.          Objective:   Physical Exam GEN: A/Ox3; pleasant , NAD, elderly and thin   HEENT:  [place]/AT,  EACs-clear, TMs-wnl, NOSE-clear, THROAT-clear, no lesions, no postnasal drip or exudate noted.   NECK:  Supple w/ fair  ROM; no JVD; normal carotid impulses w/o bruits; no thyromegaly or nodules palpated; no lymphadenopathy.  RESP  Few scant rhonchi , no accessory muscle use, no dullness to percussion  CARD:  RRR, no m/r/g  , no peripheral edema, pulses intact, no cyanosis or clubbing.  GI:   Soft & nt; nml bowel sounds; no organomegaly or masses detected.  Musco: Warm bil, no deformities or joint swelling noted.   Neuro: alert, no focal deficits noted.    Skin: Warm, no lesions or rashes         Assessment & Plan:

## 2013-10-15 NOTE — Assessment & Plan Note (Signed)
Mild flare with bronchitis   Plan  Z-Pack take as directed  Mucinex DM Twice daily  As needed  Cough/congestion  Chest xray today .  Follow up.Dr. Delton CoombesByrum as planned and As needed   Please contact office for sooner follow up if symptoms do not improve or worsen or seek emergency care

## 2013-10-19 ENCOUNTER — Other Ambulatory Visit: Payer: Self-pay | Admitting: Gynecology

## 2013-10-21 NOTE — Progress Notes (Signed)
Quick Note:  Called spoke with patient, advised of cxr results / recs as stated by TP. Pt verbalized her understanding and denied any questions. ______ 

## 2013-10-22 ENCOUNTER — Encounter: Payer: Self-pay | Admitting: Gynecology

## 2013-10-22 ENCOUNTER — Ambulatory Visit (INDEPENDENT_AMBULATORY_CARE_PROVIDER_SITE_OTHER): Payer: Medicare Other | Admitting: Gynecology

## 2013-10-22 VITALS — BP 120/76 | Ht 65.5 in | Wt 122.0 lb

## 2013-10-22 DIAGNOSIS — Z79890 Hormone replacement therapy (postmenopausal): Secondary | ICD-10-CM

## 2013-10-22 DIAGNOSIS — M8580 Other specified disorders of bone density and structure, unspecified site: Secondary | ICD-10-CM

## 2013-10-22 DIAGNOSIS — N952 Postmenopausal atrophic vaginitis: Secondary | ICD-10-CM

## 2013-10-22 NOTE — Progress Notes (Signed)
Jama FlavorsGertrude Buscemi 07-22-1921 [id number]        80 y.o.  G2P2 for follow up exam. Several issues noted below.  Past medical history,surgical history, problem list, medications, allergies, family history and social history were all reviewed and documented as reviewed in the EPIC chart.  ROS:  12 system ROS performed with pertinent positives and negatives included in the history, assessment and plan.   Additional significant findings :  none   Exam: [facility] personassistant Filed Vitals:   10/22/13 1123  BP: 120/76  Height: 5' 5.5" (1.664 m)  Weight: 122 lb (55.339 kg)   General appearance:  Normal affect, orientation and appearance. Skin: Grossly normal HEENT: Without gross lesions.  No cervical or supraclavicular adenopathy. Thyroid normal.  Lungs:  Clear without wheezing, rales or rhonchi Cardiac: RR, without RMG Abdominal:  Soft, nontender, without masses, guarding, rebound, organomegaly or hernia Breasts:  Examined lying and sitting without masses, retractions, discharge or axillary adenopathy. Pelvic:  Ext/BUS/vagina with atrophic changes  Adnexa  Without masses or tenderness    Anus and perineum  Normal   Rectovaginal  Normal sphincter tone without palpated masses or tenderness.    Assessment/Plan:  80 y.o. G2P2 female for follow up exam.   1. Postmenopausal/atrophic genital changes/ERT. Status post TAH/BSO.  Patient continues on minivelle 0.05 mg patch. Had tried stopping but had unacceptable hot flashes and sweats.  I again reviewed the whole issue of HRT with her to include the WHI study with increased risk of stroke, heart attack, DVT and breast cancer. The ACOG and NAMS statements for lowest dose for the shortest period of time reviewed. The issue of continuing at her age reviewed with potential for increased risk of stroke particularly as well as heart attack DVT and breast cancer issues. She understands she is my oldest patient states it is a quality of life issue and why feel bad at  age 80. She clearly understands the issues and risks and wants to continue I refilled her x1 year. 2. Osteopenia.  DEXA 2013 T score -2.1. Statistically significant decline at all measures sites. We previously discussed this and the options for treatment. The patient is adamant against taking any medication understands her increased risk of fracture and accepts this. 3. Mammography 2013. Recommend she schedule now and patients that I will think about it. She is not sure she would do anything if she even knew that she had early breast cancer. I discussed the advantage of lumpectomy and recommended she seriously consider following up for mammogram. SBE monthly reviewed. 4. Colonoscopy a number of years ago. Patient refuses repeat colonoscopy. 5. Pap smear a number of years ago. No history of significant abnormal Pap smears. At her age and status post hysterectomy we both feel comfortable stop screening. 6. Health maintenance. No blood work done as she has this done through her primary physician's office. Follow up one year, sooner as needed.      Dara LordsFONTAINE,Lesley Galentine P MD, 12:19 PM 10/22/2013

## 2013-10-22 NOTE — Patient Instructions (Signed)
You may obtain a copy of any labs that were done today by logging onto MyChart as outlined in the instructions provided with your AVS (after visit summary). The office will not call with normal lab results but certainly if there are any significant abnormalities then we will contact you.   Health Maintenance, Female A healthy lifestyle and preventative care can promote health and wellness.  Maintain regular health, dental, and eye exams.  Eat a healthy diet. Foods like vegetables, fruits, whole grains, low-fat dairy products, and lean protein foods contain the nutrients you need without too many calories. Decrease your intake of foods high in solid fats, added sugars, and salt. Get information about a proper diet from your caregiver, if necessary.  Regular physical exercise is one of the most important things you can do for your health. Most adults should get at least 150 minutes of moderate-intensity exercise (any activity that increases your heart rate and causes you to sweat) each week. In addition, most adults need muscle-strengthening exercises on 2 or more days a week.   Maintain a healthy weight. The body mass index (BMI) is a screening tool to identify possible weight problems. It provides an estimate of body fat based on height and weight. Your caregiver can help determine your BMI, and can help you achieve or maintain a healthy weight. For adults 20 years and older:  A BMI below 18.5 is considered underweight.  A BMI of 18.5 to 24.9 is normal.  A BMI of 25 to 29.9 is considered overweight.  A BMI of 30 and above is considered obese.  Maintain normal blood lipids and cholesterol by exercising and minimizing your intake of saturated fat. Eat a balanced diet with plenty of fruits and vegetables. Blood tests for lipids and cholesterol should begin at age 61 and be repeated every 5 years. If your lipid or cholesterol levels are high, you are over 50, or you are a high risk for heart  disease, you may need your cholesterol levels checked more frequently.Ongoing high lipid and cholesterol levels should be treated with medicines if diet and exercise are not effective.  If you smoke, find out from your caregiver how to quit. If you do not use tobacco, do not start.  Lung cancer screening is recommended for adults aged 33 80 years who are at high risk for developing lung cancer because of a history of smoking. Yearly low-dose computed tomography (CT) is recommended for people who have at least a 30-pack-year history of smoking and are a current smoker or have quit within the past 15 years. A pack year of smoking is smoking an average of 1 pack of cigarettes a day for 1 year (for example: 1 pack a day for 30 years or 2 packs a day for 15 years). Yearly screening should continue until the smoker has stopped smoking for at least 15 years. Yearly screening should also be stopped for people who develop a health problem that would prevent them from having lung cancer treatment.  If you are pregnant, do not drink alcohol. If you are breastfeeding, be very cautious about drinking alcohol. If you are not pregnant and choose to drink alcohol, do not exceed 1 drink per day. One drink is considered to be 12 ounces (355 mL) of beer, 5 ounces (148 mL) of wine, or 1.5 ounces (44 mL) of liquor.  Avoid use of street drugs. Do not share needles with anyone. Ask for help if you need support or instructions about stopping  the use of drugs.  High blood pressure causes heart disease and increases the risk of stroke. Blood pressure should be checked at least every 1 to 2 years. Ongoing high blood pressure should be treated with medicines, if weight loss and exercise are not effective.  If you are 59 to 78 years old, ask your caregiver if you should take aspirin to prevent strokes.  Diabetes screening involves taking a blood sample to check your fasting blood sugar level. This should be done once every 3  years, after age 91, if you are within normal weight and without risk factors for diabetes. Testing should be considered at a younger age or be carried out more frequently if you are overweight and have at least 1 risk factor for diabetes.  Breast cancer screening is essential preventative care for women. You should practice "breast self-awareness." This means understanding the normal appearance and feel of your breasts and may include breast self-examination. Any changes detected, no matter how small, should be reported to a caregiver. Women in their 66s and 30s should have a clinical breast exam (CBE) by a caregiver as part of a regular health exam every 1 to 3 years. After age 101, women should have a CBE every year. Starting at age 100, women should consider having a mammogram (breast X-ray) every year. Women who have a family history of breast cancer should talk to their caregiver about genetic screening. Women at a high risk of breast cancer should talk to their caregiver about having an MRI and a mammogram every year.  Breast cancer gene (BRCA)-related cancer risk assessment is recommended for women who have family members with BRCA-related cancers. BRCA-related cancers include breast, ovarian, tubal, and peritoneal cancers. Having family members with these cancers may be associated with an increased risk for harmful changes (mutations) in the breast cancer genes BRCA1 and BRCA2. Results of the assessment will determine the need for genetic counseling and BRCA1 and BRCA2 testing.  The Pap test is a screening test for cervical cancer. Women should have a Pap test starting at age 57. Between ages 25 and 35, Pap tests should be repeated every 2 years. Beginning at age 37, you should have a Pap test every 3 years as long as the past 3 Pap tests have been normal. If you had a hysterectomy for a problem that was not cancer or a condition that could lead to cancer, then you no longer need Pap tests. If you are  between ages 50 and 76, and you have had normal Pap tests going back 10 years, you no longer need Pap tests. If you have had past treatment for cervical cancer or a condition that could lead to cancer, you need Pap tests and screening for cancer for at least 20 years after your treatment. If Pap tests have been discontinued, risk factors (such as a new sexual partner) need to be reassessed to determine if screening should be resumed. Some women have medical problems that increase the chance of getting cervical cancer. In these cases, your caregiver may recommend more frequent screening and Pap tests.  The human papillomavirus (HPV) test is an additional test that may be used for cervical cancer screening. The HPV test looks for the virus that can cause the cell changes on the cervix. The cells collected during the Pap test can be tested for HPV. The HPV test could be used to screen women aged 44 years and older, and should be used in women of any age  who have unclear Pap test results. After the age of 55, women should have HPV testing at the same frequency as a Pap test.  Colorectal cancer can be detected and often prevented. Most routine colorectal cancer screening begins at the age of 44 and continues through age 20. However, your caregiver may recommend screening at an earlier age if you have risk factors for colon cancer. On a yearly basis, your caregiver may provide home test kits to check for hidden blood in the stool. Use of a small camera at the end of a tube, to directly examine the colon (sigmoidoscopy or colonoscopy), can detect the earliest forms of colorectal cancer. Talk to your caregiver about this at age 86, when routine screening begins. Direct examination of the colon should be repeated every 5 to 10 years through age 13, unless early forms of pre-cancerous polyps or small growths are found.  Hepatitis C blood testing is recommended for all people born from [birth year] through 1963 and any  individual with known risks for hepatitis C.  Practice safe sex. Use condoms and avoid high-risk sexual practices to reduce the spread of sexually transmitted infections (STIs). Sexually active women aged 36 and younger should be checked for Chlamydia, which is a common sexually transmitted infection. Older women with new or multiple partners should also be tested for Chlamydia. Testing for other STIs is recommended if you are sexually active and at increased risk.  Osteoporosis is a disease in which the bones lose minerals and strength with aging. This can result in serious bone fractures. The risk of osteoporosis can be identified using a bone density scan. Women ages 20 and over and women at risk for fractures or osteoporosis should discuss screening with their caregivers. Ask your caregiver whether you should be taking a calcium supplement or vitamin D to reduce the rate of osteoporosis.  Menopause can be associated with physical symptoms and risks. Hormone replacement therapy is available to decrease symptoms and risks. You should talk to your caregiver about whether hormone replacement therapy is right for you.  Use sunscreen. Apply sunscreen liberally and repeatedly throughout the day. You should seek shade when your shadow is shorter than you. Protect yourself by wearing long sleeves, pants, a wide-brimmed hat, and sunglasses year round, whenever you are outdoors.  Notify your caregiver of new moles or changes in moles, especially if there is a change in shape or color. Also notify your caregiver if a mole is larger than the size of a pencil eraser.  Stay current with your immunizations. Document Released: 07/16/2010 Document Revised: 04/27/2012 Document Reviewed: 07/16/2010 [facility] Patient Information 2014 [employer].

## 2013-10-25 DIAGNOSIS — A318 Other mycobacterial infections: Secondary | ICD-10-CM | POA: Diagnosis not present

## 2013-10-25 DIAGNOSIS — F410 Panic disorder [episodic paroxysmal anxiety] without agoraphobia: Secondary | ICD-10-CM | POA: Diagnosis not present

## 2013-10-25 DIAGNOSIS — J479 Bronchiectasis, uncomplicated: Secondary | ICD-10-CM | POA: Diagnosis not present

## 2013-10-26 ENCOUNTER — Ambulatory Visit (INDEPENDENT_AMBULATORY_CARE_PROVIDER_SITE_OTHER): Payer: Medicare Other | Admitting: Psychiatry

## 2013-10-26 DIAGNOSIS — Z7189 Other specified counseling: Secondary | ICD-10-CM

## 2013-10-26 DIAGNOSIS — F411 Generalized anxiety disorder: Secondary | ICD-10-CM | POA: Diagnosis not present

## 2013-11-05 ENCOUNTER — Ambulatory Visit: Payer: Medicare Other | Admitting: Emergency Medicine

## 2013-11-08 ENCOUNTER — Other Ambulatory Visit: Payer: Self-pay | Admitting: Cardiovascular Disease

## 2013-11-12 ENCOUNTER — Other Ambulatory Visit: Payer: Self-pay | Admitting: Gynecology

## 2013-11-15 ENCOUNTER — Encounter: Payer: Self-pay | Admitting: Gynecology

## 2013-11-23 ENCOUNTER — Ambulatory Visit (INDEPENDENT_AMBULATORY_CARE_PROVIDER_SITE_OTHER): Payer: Medicare Other | Admitting: Psychiatry

## 2013-11-23 DIAGNOSIS — F411 Generalized anxiety disorder: Secondary | ICD-10-CM

## 2013-12-14 ENCOUNTER — Ambulatory Visit: Payer: Medicare Other | Admitting: Psychiatry

## 2013-12-17 ENCOUNTER — Ambulatory Visit (INDEPENDENT_AMBULATORY_CARE_PROVIDER_SITE_OTHER): Payer: Medicare Other | Admitting: Emergency Medicine

## 2013-12-17 ENCOUNTER — Encounter: Payer: Self-pay | Admitting: Emergency Medicine

## 2013-12-17 VITALS — BP 102/62 | HR 62 | Ht 66.0 in | Wt 120.4 lb

## 2013-12-17 DIAGNOSIS — J479 Bronchiectasis, uncomplicated: Secondary | ICD-10-CM

## 2013-12-17 NOTE — Patient Instructions (Signed)
Please continue your current medications as you have been taking them  Follow with Dr Thanh Pomerleau in 6 months or sooner if you have any problems  

## 2013-12-17 NOTE — Assessment & Plan Note (Signed)
Some waxing and waning symptoms but she appears to be well compensated at this time. Her biggest symptom is cough. I do not see any indication to repeat her CT scan of the chest for now as I do not feel she is a good candidate for bronchoscopy. [facility] status and CT scan suggested active disease

## 2013-12-17 NOTE — Progress Notes (Signed)
Subjective:    Patient ID: Sarah FlavorsGertrude [name], female    DOB: 10/30/1921, 80 y.o.   MRN: [id number]  HPI 80 yo never smoker, hx remote Legionella PNA, diagnosed with suspected asthma about 10 years ago. PFT's 7/10 with mixed disease, mild AFL. Formerly treated with Advair and albuterol, stopped when she moved to [place] in 2008. Stayed on Singulair. At original consult we d/c'd ACE-I. Started on Symbicort.   ROV 11/11/11 --  hx bronchiectasis, chronic coughing, CAD. CT scan consistent w MAIC. We have discussed empiric MAIC therapy, but decided to defer for now. She tells me that she was treated for bronchitis recently with abx + pred. Last time we added symbicort, she feels that it is helping her >> her cough is better.   ROV 03/10/12 -- hx bronchiectasis, chronic coughing, CAD. CT scan consistent w MAIC. She has had trouble since last visit, she has been dealing w panic attacks - some better on xanax. She has had some scratchy throat, hoarse voice. Her cough has overall been better. She remains on symbicort, uses albuterol 1-2x a day. Feels "bad" sometimes, weak, fatigued. ? Whether this relates to her hx MAIC. Remains on lisinopril.  ROV 06/03/12 -- bronchiectasis, chronic coughing, CAD. CT scan consistent w MAIC. She was admitted beginning of April for bronchitis and then severe GERD and CP (? Due to the prednisone). Currently sx are minimal. Very little cough. She stopped symbicort temporarily due to thrush - back on it now.   ROV 07/15/12 -- bronchiectasis, chronic coughing, CAD. CT scan consistent w MAIC. Tried stopping Symbicort > seems to have tolerated. Minimal cough. Using albuterol rarely She has anxiety attacks, has been having more trouble last few days.   ROV 02/05/13 -- bronchiectasis, chronic coughing, CAD. CT scan consistent w MAIC. She just had an AE of her bronchiectasis, some green sputum with blood tinges. Was treated with azithro and pred.  She is on albuterol prn  ROV 09/03/13  --  pleasant woman with bronchiectasis, CAD, ? MAIC based on CT chest. She has help at home, her husband was recently admitted to SNF and it is very difficult for her. Her citalopram was changed to zoloft 2 weeks ago. She reports 10 days of HA, dizziness. She has been coughing more - small bits of dark mucous. No hemoptysis. No fever   ROV 09/17/13 Bronchiectasis , CAD, ?MAIC based on CT  Returns for  1 week follow up for recent Bronchiectatic flare Tx w/ Zpack . Feeling much better.  Reports breathing is back to baseline.  no new complaints.  Acute OV  Complains of 2 week of productive cough with thick yellow white mucus . Was doing well until weather changed with rains/storms. No fever, orthopnea, edema or n/v/d.  Concerned cough and congestion are going to get worse.  Requests zpack , says it helped so much last ov.   ROV 12/17/13 -- follow up visit for chronic cough, bronchiectasis. She has been dong fairly well. She is going through a hard time because her husband has needed to move to a memory ward.  She is having some intermittent HA, no real cough right now. Last CT was 02/2012.     Review of Systems As above      Objective:   Physical Exam  Filed Vitals:   12/17/13 1438  BP: 102/62  Pulse: 62  Height: 5\' 6"  (1.676 m)  Weight: 120 lb 6.4 oz (54.613 kg)  SpO2: 94%    GEN: A/Ox3;  pleasant , NAD, elderly and thin   HEENT:  [place]/AT,  NOSE-clear, THROAT-clear, no lesions, no postnasal drip or exudate noted.   NECK:  Supple w/ fair ROM; no JVD; normal carotid impulses w/o bruits; no thyromegaly or nodules palpated; no lymphadenopathy.  RESP  Few B insp crackles, more on the L , no dullness to percussion  CARD:  RRR, no m/r/g  , no peripheral edema, pulses intact, no cyanosis or clubbing.  Musco: Warm bil, no deformities or joint swelling noted.   Neuro: alert, no focal deficits noted.    Skin: Warm, no lesions or rashes      Assessment & Plan:  BRONCHIECTASIS Some waxing  and waning symptoms but she appears to be well compensated at this time. Her biggest symptom is cough. I do not see any indication to repeat her CT scan of the chest for now as I do not feel she is a good candidate for bronchoscopy. [facility] if clinical status and CT scan suggested active disease

## 2013-12-28 DIAGNOSIS — A318 Other mycobacterial infections: Secondary | ICD-10-CM | POA: Diagnosis not present

## 2013-12-28 DIAGNOSIS — N183 Chronic kidney disease, stage 3 (moderate): Secondary | ICD-10-CM | POA: Diagnosis not present

## 2013-12-28 DIAGNOSIS — M899 Disorder of bone, unspecified: Secondary | ICD-10-CM | POA: Diagnosis not present

## 2013-12-28 DIAGNOSIS — F324 Major depressive disorder, single episode, in partial remission: Secondary | ICD-10-CM | POA: Diagnosis not present

## 2013-12-28 DIAGNOSIS — E039 Hypothyroidism, unspecified: Secondary | ICD-10-CM | POA: Diagnosis not present

## 2013-12-28 DIAGNOSIS — R55 Syncope and collapse: Secondary | ICD-10-CM | POA: Diagnosis not present

## 2013-12-28 DIAGNOSIS — I214 Non-ST elevation (NSTEMI) myocardial infarction: Secondary | ICD-10-CM | POA: Diagnosis not present

## 2013-12-28 DIAGNOSIS — J479 Bronchiectasis, uncomplicated: Secondary | ICD-10-CM | POA: Diagnosis not present

## 2013-12-29 ENCOUNTER — Ambulatory Visit (INDEPENDENT_AMBULATORY_CARE_PROVIDER_SITE_OTHER): Payer: Medicare Other | Admitting: Psychiatry

## 2013-12-29 DIAGNOSIS — F411 Generalized anxiety disorder: Secondary | ICD-10-CM | POA: Diagnosis not present

## 2013-12-29 DIAGNOSIS — Z7189 Other specified counseling: Secondary | ICD-10-CM

## 2014-01-12 ENCOUNTER — Ambulatory Visit (INDEPENDENT_AMBULATORY_CARE_PROVIDER_SITE_OTHER): Payer: Medicare Other | Admitting: Psychiatry

## 2014-01-12 DIAGNOSIS — F411 Generalized anxiety disorder: Secondary | ICD-10-CM | POA: Diagnosis not present

## 2014-01-12 DIAGNOSIS — Z7189 Other specified counseling: Secondary | ICD-10-CM

## 2014-01-15 ENCOUNTER — Emergency Department (HOSPITAL_COMMUNITY): Payer: Medicare Other

## 2014-01-15 ENCOUNTER — Encounter (HOSPITAL_COMMUNITY): Payer: Self-pay | Admitting: Emergency Medicine

## 2014-01-15 ENCOUNTER — Emergency Department (HOSPITAL_COMMUNITY)
Admission: EM | Admit: 2014-01-15 | Discharge: 2014-01-15 | Disposition: A | Discharge status: Home / Self Care | Payer: Medicare Other | Attending: Emergency Medicine | Admitting: Emergency Medicine

## 2014-01-15 DIAGNOSIS — H547 Unspecified visual loss: Secondary | ICD-10-CM | POA: Diagnosis not present

## 2014-01-15 DIAGNOSIS — F419 Anxiety disorder, unspecified: Secondary | ICD-10-CM | POA: Diagnosis not present

## 2014-01-15 DIAGNOSIS — M479 Spondylosis, unspecified: Secondary | ICD-10-CM | POA: Diagnosis not present

## 2014-01-15 DIAGNOSIS — R51 Headache: Secondary | ICD-10-CM | POA: Diagnosis not present

## 2014-01-15 DIAGNOSIS — G40909 Epilepsy, unspecified, not intractable, without status epilepticus: Secondary | ICD-10-CM | POA: Insufficient documentation

## 2014-01-15 DIAGNOSIS — G43909 Migraine, unspecified, not intractable, without status migrainosus: Secondary | ICD-10-CM | POA: Diagnosis not present

## 2014-01-15 DIAGNOSIS — Z79899 Other long term (current) drug therapy: Secondary | ICD-10-CM | POA: Diagnosis not present

## 2014-01-15 DIAGNOSIS — Z9889 Other specified postprocedural states: Secondary | ICD-10-CM | POA: Insufficient documentation

## 2014-01-15 DIAGNOSIS — I671 Cerebral aneurysm, nonruptured: Secondary | ICD-10-CM | POA: Diagnosis not present

## 2014-01-15 DIAGNOSIS — I519 Heart disease, unspecified: Secondary | ICD-10-CM | POA: Diagnosis not present

## 2014-01-15 DIAGNOSIS — Z8719 Personal history of other diseases of the digestive system: Secondary | ICD-10-CM | POA: Diagnosis not present

## 2014-01-15 DIAGNOSIS — F329 Major depressive disorder, single episode, unspecified: Secondary | ICD-10-CM | POA: Insufficient documentation

## 2014-01-15 DIAGNOSIS — H53132 Sudden visual loss, left eye: Secondary | ICD-10-CM

## 2014-01-15 DIAGNOSIS — H5462 Unqualified visual loss, left eye, normal vision right eye: Secondary | ICD-10-CM

## 2014-01-15 DIAGNOSIS — Z8619 Personal history of other infectious and parasitic diseases: Secondary | ICD-10-CM | POA: Diagnosis not present

## 2014-01-15 DIAGNOSIS — Z7982 Long term (current) use of aspirin: Secondary | ICD-10-CM | POA: Diagnosis not present

## 2014-01-15 DIAGNOSIS — M19042 Primary osteoarthritis, left hand: Secondary | ICD-10-CM | POA: Diagnosis not present

## 2014-01-15 DIAGNOSIS — E039 Hypothyroidism, unspecified: Secondary | ICD-10-CM | POA: Diagnosis not present

## 2014-01-15 DIAGNOSIS — J45909 Unspecified asthma, uncomplicated: Secondary | ICD-10-CM | POA: Diagnosis not present

## 2014-01-15 DIAGNOSIS — I252 Old myocardial infarction: Secondary | ICD-10-CM | POA: Insufficient documentation

## 2014-01-15 DIAGNOSIS — Z8701 Personal history of pneumonia (recurrent): Secondary | ICD-10-CM | POA: Insufficient documentation

## 2014-01-15 DIAGNOSIS — H538 Other visual disturbances: Secondary | ICD-10-CM | POA: Diagnosis not present

## 2014-01-15 DIAGNOSIS — H5712 Ocular pain, left eye: Secondary | ICD-10-CM | POA: Diagnosis present

## 2014-01-15 DIAGNOSIS — M19041 Primary osteoarthritis, right hand: Secondary | ICD-10-CM | POA: Insufficient documentation

## 2014-01-15 LAB — COMPREHENSIVE METABOLIC PANEL
ALBUMIN: 3.8 g/dL (ref 3.5–5.2)
ALK PHOS: 88 U/L (ref 39–117)
ALT: 11 U/L (ref 0–35)
AST: 26 U/L (ref 0–37)
Anion gap: 7 (ref 5–15)
BILIRUBIN TOTAL: 0.4 mg/dL (ref 0.3–1.2)
BUN: 9 mg/dL (ref 6–23)
CO2: 29 mmol/L (ref 19–32)
Calcium: 8.8 mg/dL (ref 8.4–10.5)
Chloride: 103 mEq/L (ref 96–112)
Creatinine, Ser: 0.76 mg/dL (ref 0.50–1.10)
GFR calc Af Amer: 84 mL/min — ABNORMAL LOW (ref >90)
GFR calc non Af Amer: 72 mL/min — ABNORMAL LOW (ref >90)
Glucose, Bld: 86 mg/dL (ref 70–99)
Potassium: 4.1 mmol/L (ref 3.5–5.1)
Sodium: 139 mmol/L (ref 135–145)
Total Protein: 7 g/dL (ref 6.0–8.3)

## 2014-01-15 LAB — CBC WITH DIFFERENTIAL/PLATELET
Basophils Absolute: 0 10*3/uL (ref 0.0–0.1)
Basophils Relative: 1 % (ref 0–1)
Eosinophils Absolute: 0.2 10*3/uL (ref 0.0–0.7)
Eosinophils Relative: 4 % (ref 0–5)
HCT: 37.7 % (ref 36.0–46.0)
HEMOGLOBIN: 11.6 g/dL — AB (ref 12.0–15.0)
Lymphocytes Relative: 27 % (ref 12–46)
Lymphs Abs: 1.6 10*3/uL (ref 0.7–4.0)
MCH: 27.6 pg (ref 26.0–34.0)
MCHC: 30.8 g/dL (ref 30.0–36.0)
MCV: 89.8 fL (ref 78.0–100.0)
Monocytes Absolute: 0.4 10*3/uL (ref 0.1–1.0)
Monocytes Relative: 7 % (ref 3–12)
NEUTROS ABS: 3.6 10*3/uL (ref 1.7–7.7)
NEUTROS PCT: 61 % (ref 43–77)
Platelets: 225 10*3/uL (ref 150–400)
RBC: 4.2 MIL/uL (ref 3.87–5.11)
RDW: 15.9 % — AB (ref 11.5–15.5)
WBC: 5.9 10*3/uL (ref 4.0–10.5)

## 2014-01-15 LAB — I-STAT TROPONIN, ED: Troponin i, poc: 0 ng/mL (ref 0.00–0.08)

## 2014-01-15 LAB — SEDIMENTATION RATE: Sed Rate: 35 mm/hr — ABNORMAL HIGH (ref 0–22)

## 2014-01-15 MED ORDER — ACETAMINOPHEN-CODEINE #3 300-30 MG PO TABS
1.0000 | ORAL_TABLET | Freq: Once | ORAL | Status: AC
  Administered 2014-01-15: 1 via ORAL
  Filled 2014-01-15: qty 1

## 2014-01-15 MED ORDER — GADOBENATE DIMEGLUMINE 529 MG/ML IV SOLN
10.0000 mL | Freq: Once | INTRAVENOUS | Status: AC | PRN
  Administered 2014-01-15: 10 mL via INTRAVENOUS

## 2014-01-15 MED ORDER — ALPRAZOLAM 0.5 MG PO TABS
0.5000 mg | ORAL_TABLET | Freq: Once | ORAL | Status: AC
  Administered 2014-01-15: 0.5 mg via ORAL
  Filled 2014-01-15: qty 1

## 2014-01-15 NOTE — ED Provider Notes (Signed)
CSN: [id number]     Arrival date & time 01/15/14  5974 History   First MD Initiated Contact with Patient 01/15/14 0912     Chief Complaint  Patient presents with  . Eye Pain     (Consider location/radiation/quality/duration/timing/severity/associated sxs/prior Treatment) The history is provided by the patient.  Sarah [name] is a 81 y.o. female hx of bronchiectasis, TIAs, HTN here with blurry vision. L eye vision loss around 6:50 AM. Woke up around 6 am and felt ok until 6:50 AM. Blurry vision improved but not resolved. Denies any weakness or trouble speaking. Has hx of TIAs. Took xanax 0.5 mg because she felt anxious.    Past Medical History  Diagnosis Date  . Bronchiectasis     with history of Legionaires with chronic rales/rhonchi  . Pollen allergies   . Hypertension     Negative renal duplex in December of 2012  . Allergic rhinitis   . Diastolic dysfunction     a. per echo 08/2010 with normal LV function;  b. 06/2011 Echo: EF 65-70%  . Idiopathic peripheral neuropathy   . Hypothyroidism   . Depression   . H/O hiatal hernia   . Osteopenia 02/2011    t score - 2.1  . Hypercholesteremia   . Chest pain at rest   . NSTEMI (non-ST elevated myocardial infarction)     a. Normal coronaries per cath August 2012 and negative CT angio for PE;  b. 06/2011 Repeat admission w/ chest pain and elevated troponin's, CTA Chest w/o PE  . Asthma   . MAC (mycobacterium avium-intracellulare complex)   . Shortness of breath     "sometimes just lying down" (04/15/2012)  . Pneumonia     "recurrent" (04/15/2012)  . H/O Legionnaire's disease 11/1976  . History of blood transfusion 1972    "w/hysterectomy" (04/15/2012)  . GERD (gastroesophageal reflux disease)     "just recently" (04/15/2012)  . Daily headache     "over the last week" 04/15/2012   . Migraines     "outgrew them" (04/15/2012)  . Seizures 1930's    "3-4 as a preteen" (04/15/2012)  . Arthritis     "a little; in my back and hands" (04/15/2012)  .  Anxiety     Anxiety attacks   Past Surgical History  Procedure Laterality Date  . Dilation and curettage of uterus    . Appendectomy    . Cardiac catheterization  August 2012    Normal coronaries.  . Tonsillectomy  1930  . Total abdominal hysterectomy w/ bilateral salpingoophorectomy  1972    leiomyomata, menorrhagia   Family History  Problem Relation Age of Onset  . Hypertension Mother     stroke  . Stroke Mother   . Cancer Brother     prostate  . Osteoarthritis Paternal Aunt    History  Substance Use Topics  . Smoking status: Never Smoker   . Smokeless tobacco: Never Used  . Alcohol Use: 1.2 oz/week    2 Glasses of wine per week     Comment: wine 1-2 week   OB History    Gravida Para Term Preterm AB TAB SAB Ectopic Multiple Living   '2 2        2     ' Review of Systems  Eyes: Positive for pain.  Neurological:       Blurry vision   All other systems reviewed and are negative.     Allergies  Hydrocodone; Levofloxacin; Oxycodone; Clarithromycin; and Isosorbide  Home Medications  Prior to Admission medications   Medication Sig Start Date End Date Taking? Authorizing Provider  acetaminophen-codeine (TYLENOL #3) 300-30 MG per tablet Take 1 tablet by mouth 4 (four) times daily as needed (anxiety).  12/01/10  Yes Historical Provider, MD  ALPRAZolam Duanne Moron) 0.5 MG tablet Take 0.5 mg by mouth 3 (three) times daily as needed for anxiety.    Yes Historical Provider, MD  aspirin 325 MG tablet Take 325 mg by mouth daily.   Yes Historical Provider, MD  clonazePAM (KLONOPIN) 0.5 MG tablet Take 0.5 mg by mouth at bedtime.  04/06/13  Yes Historical Provider, MD  levothyroxine (SYNTHROID, LEVOTHROID) 50 MCG tablet Take 50 mcg by mouth daily.     Yes Historical Provider, MD  metoprolol tartrate (LOPRESSOR) 25 MG tablet TAKE 1/2 TABLET BY MOUTH TWICE DAILY   Yes Thayer Headings, MD  MINIVELLE 0.05 MG/24HR patch PLACE 1 PATCH ONTO SKIN ONCE A WEEK(THURSDAY) 11/12/13  Yes Anastasio Auerbach, MD  nitroGLYCERIN (NITROSTAT) 0.4 MG SL tablet Place 0.4 mg under the tongue every 5 (five) minutes as needed for chest pain.   Yes Historical Provider, MD  sertraline (ZOLOFT) 50 MG tablet Take 50 mg by mouth daily.   Yes Historical Provider, MD  levalbuterol [facility] HFA) 45 MCG/ACT inhaler Inhale 1-2 puffs into the lungs every 6 (six) hours as needed for wheezing.    Historical Provider, MD   BP 165/72 mmHg  Pulse 57  Temp(Src) 97.8 F (36.6 C) (Oral)  Resp 16  SpO2 97%  LMP 01/14/1970 Physical Exam  Constitutional: She is oriented to person, place, and time.  Well appearing for age   HENT:  Head: Normocephalic.  Mouth/Throat: Oropharynx is clear and moist.  Eyes: Conjunctivae and EOM are normal. Pupils are equal, round, and reactive to light.  No obvious papilledema   Neck: Normal range of motion. Neck supple.  Cardiovascular: Normal rate, regular rhythm and normal heart sounds.   Pulmonary/Chest: Effort normal and breath sounds normal. No respiratory distress. She has no wheezes. She has no rales.  Abdominal: Soft. Bowel sounds are normal. She exhibits no distension. There is no tenderness. There is no rebound.  Musculoskeletal: Normal range of motion.  Neurological: She is alert and oriented to person, place, and time.  CN 2-12 intact. Nl strength throughout. Nl finger to nose.   Skin: Skin is warm and dry.  Psychiatric: She has a normal mood and affect. Her behavior is normal. Judgment and thought content normal.  Nursing note and vitals reviewed.   ED Course  Procedures (including critical care time) Labs Review Labs Reviewed  CBC WITH DIFFERENTIAL - Abnormal; Notable for the following:    Hemoglobin 11.6 (*)    RDW 15.9 (*)    All other components within normal limits  COMPREHENSIVE METABOLIC PANEL - Abnormal; Notable for the following:    GFR calc non Af Amer 72 (*)    GFR calc Af Amer 84 (*)    All other components within normal limits  SEDIMENTATION  RATE - Abnormal; Notable for the following:    Sed Rate 35 (*)    All other components within normal limits  I-STAT TROPOININ, ED    Imaging Review Ct Head Wo Contrast  01/15/2014   CLINICAL DATA:  Initial evaluation for blurred vision left eye this morning  EXAM: CT HEAD WITHOUT CONTRAST  TECHNIQUE: Contiguous axial images were obtained from the base of the skull through the vertex without intravenous contrast.  COMPARISON:  04/04/2013  FINDINGS: Significant age-related atrophy. Mild low attenuation in the deep white matter. No hydrocephalus.  No focal abnormal attenuation to suggest mass or vascular territory infarct. No hemorrhage or extra-axial fluid. Visualized paranasal sinuses normal. Calvarium intact. No acute findings in either orbit.  IMPRESSION: Anticipated age-related involutional change with no acute findings.   Electronically Signed   By: Skipper Cliche M.D.   On: 01/15/2014 10:05   Mr Jeri Cos NW Contrast  01/15/2014   CLINICAL DATA:  Woke up this morning with severe pain in the left orbit. Some visual loss on the left. Persistent blurred vision. Headache.  EXAM: MRI HEAD AND ORBITS WITHOUT AND WITH CONTRAST  TECHNIQUE: Multiplanar, multiecho pulse sequences of the brain and surrounding structures were obtained without and with intravenous contrast. Multiplanar, multiecho pulse sequences of the orbits and surrounding structures were obtained including fat saturation techniques, before and after intravenous contrast administration.  CONTRAST:  29m MULTIHANCE GADOBENATE DIMEGLUMINE 529 MG/ML IV SOLN  COMPARISON:  Head CT same day.  MRI 10/03/2011.  FINDINGS: MRI HEAD FINDINGS  Diffusion imaging does not show any acute or subacute infarction. There chronic small-vessel changes affecting the pons in the cerebral hemispheric deep and subcortical white matter, mild in degree and less than often seen in healthy individuals of this age. No cortical or large vessel territory infarction. No  intra-axial mass lesion, hemorrhage, hydrocephalus or extra-axial collection.  There are bilateral cavernous sinus internal carotid artery aneurysms, larger on the right than the left. The aneurysm on the right measures almost 1 cm. Aneurysm on the left is difficult to measure precisely without MR angiographic technique but is probably on the order of 4 mm.  After contrast administration, no abnormal intracranial enhancement occurs.  MRI ORBITS FINDINGS  No acute finding is seen affecting either globe. Both optic nerves are normal. Extra-ocular muscles are normal. The orbital fat is normal. No lacrimal abnormality. Both orbital apices are normal.  IMPRESSION: MRI brain: No acute brain finding. Atrophy and mild chronic small vessel disease, less than often seen in healthy individuals of this age.  Bilateral cavernous ICA aneurysms, larger on the right on the left. These are not precisely characterized without MR angiographic technique. Aneurysm on the right is estimated at 1 cm and on the left is estimated at 4 mm. Similar appearance to the MR angiogram of 2013. These could possibly be symptomatic from the standpoint of cavernous sinus cranial neuropathy, but should not affect the seconds nerves.  MRI orbits: No primary orbital pathology.   Electronically Signed   By: MNelson ChimesM.D.   On: 01/15/2014 14:05   Mr ODarnelle CatalanWo/w Cm  01/15/2014   CLINICAL DATA:  Woke up this morning with severe pain in the left orbit. Some visual loss on the left. Persistent blurred vision. Headache.  EXAM: MRI HEAD AND ORBITS WITHOUT AND WITH CONTRAST  TECHNIQUE: Multiplanar, multiecho pulse sequences of the brain and surrounding structures were obtained without and with intravenous contrast. Multiplanar, multiecho pulse sequences of the orbits and surrounding structures were obtained including fat saturation techniques, before and after intravenous contrast administration.  CONTRAST:  19mMULTIHANCE GADOBENATE DIMEGLUMINE 529 MG/ML  IV SOLN  COMPARISON:  Head CT same day.  MRI 10/03/2011.  FINDINGS: MRI HEAD FINDINGS  Diffusion imaging does not show any acute or subacute infarction. There chronic small-vessel changes affecting the pons in the cerebral hemispheric deep and subcortical white matter, mild in degree and less than often seen in healthy individuals of this age. No  cortical or large vessel territory infarction. No intra-axial mass lesion, hemorrhage, hydrocephalus or extra-axial collection.  There are bilateral cavernous sinus internal carotid artery aneurysms, larger on the right than the left. The aneurysm on the right measures almost 1 cm. Aneurysm on the left is difficult to measure precisely without MR angiographic technique but is probably on the order of 4 mm.  After contrast administration, no abnormal intracranial enhancement occurs.  MRI ORBITS FINDINGS  No acute finding is seen affecting either globe. Both optic nerves are normal. Extra-ocular muscles are normal. The orbital fat is normal. No lacrimal abnormality. Both orbital apices are normal.  IMPRESSION: MRI brain: No acute brain finding. Atrophy and mild chronic small vessel disease, less than often seen in healthy individuals of this age.  Bilateral cavernous ICA aneurysms, larger on the right on the left. These are not precisely characterized without MR angiographic technique. Aneurysm on the right is estimated at 1 cm and on the left is estimated at 4 mm. Similar appearance to the MR angiogram of 2013. These could possibly be symptomatic from the standpoint of cavernous sinus cranial neuropathy, but should not affect the seconds nerves.  MRI orbits: No primary orbital pathology.   Electronically Signed   By: Nelson Chimes M.D.   On: 01/15/2014 14:05     EKG Interpretation None      MDM   Final diagnoses:  Blurry vision, left eye   Sarah [name] is a 81 y.o. female here with L eye blurry vision. Consider stroke vs TIA. Minimal symptoms. I called Dr.  Aram Beecham from neurology, who doesn't feel that she is TPA candidate. Neuro to see. Will order labs, CT head.   2:59 PM CT unremarkable. MRI showed old aneurysms with no acute stroke. Dr. Aram Beecham saw patient and reviewed MRI orbits and brain. Recommend outpatient f/u and not inpatient workup for TIA. ESR 35, likely from age. Dr. Aram Beecham and I are not concerned for temporal arteritis with this ESR.    Wandra Arthurs, MD 01/15/14 1500

## 2014-01-15 NOTE — ED Notes (Signed)
Patient returned from CT

## 2014-01-15 NOTE — ED Notes (Signed)
Has not returned from MRI yet.

## 2014-01-15 NOTE — Discharge Instructions (Signed)
Continue your current medications.   See your eye doctor.   Return to ER if you have worse blurry vision, eye pain, weakness, trouble speaking.

## 2014-01-15 NOTE — ED Notes (Signed)
Patient transported to MRI 

## 2014-01-15 NOTE — ED Notes (Signed)
Woke up at 0650 with left eye pain. Feels scratchy she states. States she couldn't see out of it for 5 minutes, but now can see out of it. Eye pain caused anxiety, so she took 0.5mg  of xanax.

## 2014-01-15 NOTE — Consult Note (Signed)
NEURO HOSPITALIST CONSULT NOTE    Reason for Consult: acute painful partial visual loss left eye  HPI:                                                                                                                                          Sarah [name] is an 81 y.o. female with a past medical history significant for HTN, hypercholesterolemia, NSTEMI, idiopathic peripheral neuropathy, transient visual loss right eye 2015, asthma, presents to the ED accompanied by her daughters for evaluation of acute onset painful visual loss left eye. Stated that she went to bed last night feeling fine but woke up around 6 am with " an excruciating pain around and behind my left eyeball" that was subsequently followed by " some visual loss" of that eye. She said that now her vision is blurred but she can see and the pain is minimal. Further, she had HA today but no associated nausea, vomiting, vertigo, focal weakness or numbness, eye droopiness,slurred speech, language impairment or confusion. She has chronic hearing loss. Denies difficulty chewing or swallowing, jaw claudication, tenderness over the left temporal region, fever, chills, or recent infection. No trauma to the right eye. Sarah [name] tells me that last year she had transient visual loss of the opposite eye that lasted for about 3 hour and she regained full vision. She is unsure if she had associated right eye pain at that time, but said that she saw an ophthalmologist and there was nothing wrong with that eye and she was told that it was a TIA. I reviewed her CT brain from today ad it showed no acute intracranial abnormality.   Past Medical History  Diagnosis Date  . Bronchiectasis     with history of Legionaires with chronic rales/rhonchi  . Pollen allergies   . Hypertension     Negative renal duplex in December of 2012  . Allergic rhinitis   . Diastolic dysfunction     a. per echo 08/2010 with normal LV function;  b. 06/2011  Echo: EF 65-70%  . Idiopathic peripheral neuropathy   . Hypothyroidism   . Depression   . H/O hiatal hernia   . Osteopenia 02/2011    t score - 2.1  . Hypercholesteremia   . Chest pain at rest   . NSTEMI (non-ST elevated myocardial infarction)     a. Normal coronaries per cath August 2012 and negative CT angio for PE;  b. 06/2011 Repeat admission w/ chest pain and elevated troponin's, CTA Chest w/o PE  . Asthma   . MAC (mycobacterium avium-intracellulare complex)   . Shortness of breath     "sometimes just lying down" (04/15/2012)  . Pneumonia     "recurrent" (04/15/2012)  . H/O Legionnaire's disease 11/1976  . History of  blood transfusion 1972    "w/hysterectomy" (04/15/2012)  . GERD (gastroesophageal reflux disease)     "just recently" (04/15/2012)  . Daily headache     "over the last week" 04/15/2012   . Migraines     "outgrew them" (04/15/2012)  . Seizures 1930's    "3-4 as a preteen" (04/15/2012)  . Arthritis     "a little; in my back and hands" (04/15/2012)  . Anxiety     Anxiety attacks    Past Surgical History  Procedure Laterality Date  . Dilation and curettage of uterus    . Appendectomy    . Cardiac catheterization  August 2012    Normal coronaries.  . Tonsillectomy  1930  . Total abdominal hysterectomy w/ bilateral salpingoophorectomy  1972    leiomyomata, menorrhagia    Family History  Problem Relation Age of Onset  . Hypertension Mother     stroke  . Stroke Mother   . Cancer Brother     prostate  . Osteoarthritis Paternal Aunt     Family History: no brain tumor, epilepsy, or PD   Social History:  reports that she has never smoked. She has never used smokeless tobacco. She reports that she drinks about 1.2 oz of alcohol per week. She reports that she does not use illicit drugs.  Allergies  Allergen Reactions  . Hydrocodone Nausea And Vomiting and Other (See Comments)    Passed out, threw up, blood pressure dropped  . Levofloxacin Other (See Comments)     Pass out, vomiting  . Oxycodone Other (See Comments)    Rapid heart beat  . Clarithromycin Other (See Comments)    Vomiting  . Isosorbide Other (See Comments)    Heart rate drops    MEDICATIONS:                                                                                                                     Scheduled:   ROS:                                                                                                                                       History obtained from the patient and chart review.  General ROS: negative for - chills, fatigue, fever, night sweats, weight gain or weight loss Psychological ROS: negative for - behavioral disorder, hallucinations, memory difficulties, mood swings or suicidal ideation Ophthalmic ROS: negative for -  double vision ENT ROS: negative for - epistaxis, nasal discharge, oral lesions, sore throat, tinnitus or vertigo Allergy and Immunology ROS: negative for - hives or itchy/watery eyes Hematological and Lymphatic ROS: negative for - bleeding problems, bruising or swollen lymph nodes Endocrine ROS: negative for - galactorrhea, hair pattern changes, polydipsia/polyuria or temperature intolerance Respiratory ROS: negative for - cough, hemoptysis, shortness of breath or wheezing Cardiovascular ROS: negative for - chest pain, dyspnea on exertion, edema or irregular heartbeat Gastrointestinal ROS: negative for - abdominal pain, diarrhea, hematemesis, nausea/vomiting or stool incontinence Genito-Urinary ROS: negative for - dysuria, hematuria, incontinence or urinary frequency/urgency Musculoskeletal ROS: negative for - joint swelling or muscular weakness Neurological ROS: as noted in HPI Dermatological ROS: negative for rash and skin lesion changes  Physical exam: pleasant female in no apparent distress. Blood pressure 182/81, pulse 56, temperature 97.8 F (36.6 C), temperature source Oral, resp. rate 16, last menstrual period 01/14/1970, SpO2  95 %. Head: normocephalic. Neck: supple, no bruits, no JVD. Cardiac: no murmurs. Lungs: clear. Abdomen: soft, no tender, no mass. Extremities: no edema. Neurologic Examination:                                                                                                      General: Mental Status: Alert, oriented, thought content appropriate.  Speech fluent without evidence of aphasia.  Able to follow 3 step commands without difficulty. Cranial Nerves: II: Discs flat bilaterally; Visual fields grossly normal, pupils equal, round, reactive to light and accommodation III,IV, VI: ptosis not present, extra-ocular motions intact bilaterally V,VII: smile symmetric, facial light touch sensation normal bilaterally VIII: hearing normal bilaterally IX,X: gag reflex present XI: bilateral shoulder shrug XII: midline tongue extension without atrophy or fasciculations Motor: Right : Upper extremity   5/5    Left:     Upper extremity   5/5  Lower extremity   5/5     Lower extremity   5/5 Tone and bulk:normal tone throughout; no atrophy noted Sensory: Pinprick and light touch intact throughout, bilaterally Deep Tendon Reflexes:  Right: Upper Extremity   Left: Upper extremity   biceps (C-5 to C-6) 2/4   biceps (C-5 to C-6) 2/4 tricep (C7) 2/4    triceps (C7) 2/4 Brachioradialis (C6) 2/4  Brachioradialis (C6) 2/4  Lower Extremity Lower Extremity  quadriceps (L-2 to L-4) 2/4   quadriceps (L-2 to L-4) 2/4 Achilles (S1) 2/4   Achilles (S1) 2/4  Plantars: Right: downgoing   Left: downgoing Cerebellar: normal finger-to-nose,  normal heel-to-shin test Gait:  No ataxia.    Lab Results  Component Value Date/Time   CHOL 143 10/29/2011 12:36 PM    Results for orders placed or performed during the hospital encounter of 01/15/14 (from the past 48 hour(s))  CBC with Differential     Status: Abnormal   Collection Time: 01/15/14 10:10 AM  Result Value Ref Range   WBC 5.9 4.0 - 10.5 K/uL   RBC  4.20 3.87 - 5.11 MIL/uL   Hemoglobin 11.6 (L) 12.0 - 15.0 g/dL   HCT 37.7 36.0 - 46.0 %  MCV 89.8 78.0 - 100.0 fL   MCH 27.6 26.0 - 34.0 pg   MCHC 30.8 30.0 - 36.0 g/dL   RDW 15.9 (H) 11.5 - 15.5 %   Platelets 225 150 - 400 K/uL   Neutrophils Relative % 61 43 - 77 %   Neutro Abs 3.6 1.7 - 7.7 K/uL   Lymphocytes Relative 27 12 - 46 %   Lymphs Abs 1.6 0.7 - 4.0 K/uL   Monocytes Relative 7 3 - 12 %   Monocytes Absolute 0.4 0.1 - 1.0 K/uL   Eosinophils Relative 4 0 - 5 %   Eosinophils Absolute 0.2 0.0 - 0.7 K/uL   Basophils Relative 1 0 - 1 %   Basophils Absolute 0.0 0.0 - 0.1 K/uL  Comprehensive metabolic panel     Status: Abnormal   Collection Time: 01/15/14 10:10 AM  Result Value Ref Range   Sodium 139 135 - 145 mmol/L    Comment: Please note change in reference range.   Potassium 4.1 3.5 - 5.1 mmol/L    Comment: Please note change in reference range.   Chloride 103 96 - 112 mEq/L   CO2 29 19 - 32 mmol/L   Glucose, Bld 86 70 - 99 mg/dL   BUN 9 6 - 23 mg/dL   Creatinine, Ser 0.76 0.50 - 1.10 mg/dL   Calcium 8.8 8.4 - 10.5 mg/dL   Total Protein 7.0 6.0 - 8.3 g/dL   Albumin 3.8 3.5 - 5.2 g/dL   AST 26 0 - 37 U/L   ALT 11 0 - 35 U/L   Alkaline Phosphatase 88 39 - 117 U/L   Total Bilirubin 0.4 0.3 - 1.2 mg/dL   GFR calc non Af Amer 72 (L) >90 mL/min   GFR calc Af Amer 84 (L) >90 mL/min    Comment: (NOTE) The eGFR has been calculated using the CKD EPI equation. This calculation has not been validated in all clinical situations. eGFR's persistently <90 mL/min signify possible Chronic Kidney Disease.    Anion gap 7 5 - 15  I-stat troponin, ED     Status: None   Collection Time: 01/15/14 10:16 AM  Result Value Ref Range   Troponin i, poc 0.00 0.00 - 0.08 ng/mL   Comment 3            Comment: Due to the release kinetics of cTnI, a negative result within the first hours of the onset of symptoms does not rule out myocardial infarction with certainty. If myocardial  infarction is still suspected, repeat the test at appropriate intervals.     Ct Head Wo Contrast  01/15/2014   CLINICAL DATA:  Initial evaluation for blurred vision left eye this morning  EXAM: CT HEAD WITHOUT CONTRAST  TECHNIQUE: Contiguous axial images were obtained from the base of the skull through the vertex without intravenous contrast.  COMPARISON:  04/04/2013  FINDINGS: Significant age-related atrophy. Mild low attenuation in the deep white matter. No hydrocephalus.  No focal abnormal attenuation to suggest mass or vascular territory infarct. No hemorrhage or extra-axial fluid. Visualized paranasal sinuses normal. Calvarium intact. No acute findings in either orbit.  IMPRESSION: Anticipated age-related involutional change with no acute findings.   Electronically Signed   By: Skipper Cliche M.D.   On: 01/15/2014 10:05   Assessment/Plan: 81 y/o with acute onset painful partial vision loss right eye. Neuro-exam and CT brain unimpressive. Differential includes giant cell arteritis, optic neuritis. A structural lesion involving the optic chiasm seems less likely.  Ordered MRI brain and orbits. Get ESR, C-reactive protein. Her vision is markedly improved and she complains only of blurred vision, thus if those test are negative, patient can be discharge home and follow up with opthalmology.  Dorian Pod, MD 01/15/2014, 11:21 AM  [facility]

## 2014-01-15 NOTE — ED Notes (Signed)
Patient returned from MRI.

## 2014-01-15 NOTE — ED Notes (Signed)
Bedside toilet placed at patient bedside for her to use when she has to urinate again, patient stated she has to use toilet every 10 min, cause of her nerves. Nurse was informed.

## 2014-01-20 DIAGNOSIS — E039 Hypothyroidism, unspecified: Secondary | ICD-10-CM | POA: Diagnosis not present

## 2014-01-20 DIAGNOSIS — K449 Diaphragmatic hernia without obstruction or gangrene: Secondary | ICD-10-CM | POA: Diagnosis not present

## 2014-01-20 DIAGNOSIS — N183 Chronic kidney disease, stage 3 (moderate): Secondary | ICD-10-CM | POA: Diagnosis not present

## 2014-01-20 DIAGNOSIS — F324 Major depressive disorder, single episode, in partial remission: Secondary | ICD-10-CM | POA: Diagnosis not present

## 2014-01-20 DIAGNOSIS — F410 Panic disorder [episodic paroxysmal anxiety] without agoraphobia: Secondary | ICD-10-CM | POA: Diagnosis not present

## 2014-01-20 DIAGNOSIS — H35033 Hypertensive retinopathy, bilateral: Secondary | ICD-10-CM | POA: Diagnosis not present

## 2014-01-20 DIAGNOSIS — M899 Disorder of bone, unspecified: Secondary | ICD-10-CM | POA: Diagnosis not present

## 2014-01-20 DIAGNOSIS — I214 Non-ST elevation (NSTEMI) myocardial infarction: Secondary | ICD-10-CM | POA: Diagnosis not present

## 2014-01-20 DIAGNOSIS — G43909 Migraine, unspecified, not intractable, without status migrainosus: Secondary | ICD-10-CM | POA: Diagnosis not present

## 2014-01-20 DIAGNOSIS — J479 Bronchiectasis, uncomplicated: Secondary | ICD-10-CM | POA: Diagnosis not present

## 2014-01-20 DIAGNOSIS — F411 Generalized anxiety disorder: Secondary | ICD-10-CM | POA: Diagnosis not present

## 2014-01-20 DIAGNOSIS — A318 Other mycobacterial infections: Secondary | ICD-10-CM | POA: Diagnosis not present

## 2014-02-01 ENCOUNTER — Ambulatory Visit (INDEPENDENT_AMBULATORY_CARE_PROVIDER_SITE_OTHER): Payer: Medicare Other | Admitting: Psychiatry

## 2014-02-01 DIAGNOSIS — Z7189 Other specified counseling: Secondary | ICD-10-CM | POA: Diagnosis not present

## 2014-02-01 DIAGNOSIS — F411 Generalized anxiety disorder: Secondary | ICD-10-CM | POA: Diagnosis not present

## 2014-02-11 DIAGNOSIS — F411 Generalized anxiety disorder: Secondary | ICD-10-CM | POA: Diagnosis not present

## 2014-02-11 DIAGNOSIS — T783XXA Angioneurotic edema, initial encounter: Secondary | ICD-10-CM | POA: Diagnosis not present

## 2014-02-21 ENCOUNTER — Other Ambulatory Visit: Payer: Self-pay | Admitting: *Deleted

## 2014-02-21 MED ORDER — METOPROLOL TARTRATE 25 MG PO TABS
12.5000 mg | ORAL_TABLET | Freq: Two times a day (BID) | ORAL | Status: DC
Start: 2014-02-21 — End: 2016-02-13

## 2014-03-01 ENCOUNTER — Ambulatory Visit: Payer: Medicare Other | Admitting: Psychiatry

## 2014-03-08 ENCOUNTER — Ambulatory Visit (INDEPENDENT_AMBULATORY_CARE_PROVIDER_SITE_OTHER): Payer: Medicare Other | Admitting: Psychiatry

## 2014-03-08 DIAGNOSIS — Z7189 Other specified counseling: Secondary | ICD-10-CM | POA: Diagnosis not present

## 2014-03-08 DIAGNOSIS — F411 Generalized anxiety disorder: Secondary | ICD-10-CM

## 2014-03-10 DIAGNOSIS — A318 Other mycobacterial infections: Secondary | ICD-10-CM | POA: Diagnosis not present

## 2014-03-10 DIAGNOSIS — R51 Headache: Secondary | ICD-10-CM | POA: Diagnosis not present

## 2014-03-10 DIAGNOSIS — F324 Major depressive disorder, single episode, in partial remission: Secondary | ICD-10-CM | POA: Diagnosis not present

## 2014-03-21 DIAGNOSIS — T783XXA Angioneurotic edema, initial encounter: Secondary | ICD-10-CM | POA: Diagnosis not present

## 2014-03-25 DIAGNOSIS — Z111 Encounter for screening for respiratory tuberculosis: Secondary | ICD-10-CM | POA: Diagnosis not present

## 2014-03-28 DIAGNOSIS — T783XXA Angioneurotic edema, initial encounter: Secondary | ICD-10-CM | POA: Diagnosis not present

## 2014-03-29 ENCOUNTER — Ambulatory Visit: Payer: Medicare Other | Admitting: Psychiatry

## 2014-04-05 ENCOUNTER — Ambulatory Visit (INDEPENDENT_AMBULATORY_CARE_PROVIDER_SITE_OTHER): Payer: Medicare Other | Admitting: Psychiatry

## 2014-04-05 DIAGNOSIS — F411 Generalized anxiety disorder: Secondary | ICD-10-CM

## 2014-04-05 DIAGNOSIS — Z7189 Other specified counseling: Secondary | ICD-10-CM

## 2014-04-07 DIAGNOSIS — F410 Panic disorder [episodic paroxysmal anxiety] without agoraphobia: Secondary | ICD-10-CM | POA: Diagnosis not present

## 2014-04-07 DIAGNOSIS — R21 Rash and other nonspecific skin eruption: Secondary | ICD-10-CM | POA: Diagnosis not present

## 2014-04-07 DIAGNOSIS — Z23 Encounter for immunization: Secondary | ICD-10-CM | POA: Diagnosis not present

## 2014-04-07 DIAGNOSIS — F324 Major depressive disorder, single episode, in partial remission: Secondary | ICD-10-CM | POA: Diagnosis not present

## 2014-04-11 ENCOUNTER — Telehealth: Payer: Self-pay | Admitting: *Deleted

## 2014-04-11 NOTE — Telephone Encounter (Signed)
Prior authorization done online for estradiol 0.5mg  patches, will wait for response.

## 2014-04-13 NOTE — Telephone Encounter (Signed)
Medication approved through 04/13/2015 

## 2014-04-26 ENCOUNTER — Ambulatory Visit: Payer: Medicare Other | Admitting: Psychiatry

## 2014-05-10 DIAGNOSIS — J029 Acute pharyngitis, unspecified: Secondary | ICD-10-CM | POA: Diagnosis not present

## 2014-05-10 DIAGNOSIS — K449 Diaphragmatic hernia without obstruction or gangrene: Secondary | ICD-10-CM | POA: Diagnosis not present

## 2014-05-10 DIAGNOSIS — F411 Generalized anxiety disorder: Secondary | ICD-10-CM | POA: Diagnosis not present

## 2014-05-10 DIAGNOSIS — J479 Bronchiectasis, uncomplicated: Secondary | ICD-10-CM | POA: Diagnosis not present

## 2014-05-10 DIAGNOSIS — H3530 Unspecified macular degeneration: Secondary | ICD-10-CM | POA: Diagnosis not present

## 2014-05-10 DIAGNOSIS — N183 Chronic kidney disease, stage 3 (moderate): Secondary | ICD-10-CM | POA: Diagnosis not present

## 2014-05-10 DIAGNOSIS — F324 Major depressive disorder, single episode, in partial remission: Secondary | ICD-10-CM | POA: Diagnosis not present

## 2014-05-10 DIAGNOSIS — I214 Non-ST elevation (NSTEMI) myocardial infarction: Secondary | ICD-10-CM | POA: Diagnosis not present

## 2014-05-10 DIAGNOSIS — A318 Other mycobacterial infections: Secondary | ICD-10-CM | POA: Diagnosis not present

## 2014-05-10 DIAGNOSIS — E039 Hypothyroidism, unspecified: Secondary | ICD-10-CM | POA: Diagnosis not present

## 2014-05-10 DIAGNOSIS — I10 Essential (primary) hypertension: Secondary | ICD-10-CM | POA: Diagnosis not present

## 2014-05-10 DIAGNOSIS — H35039 Hypertensive retinopathy, unspecified eye: Secondary | ICD-10-CM | POA: Diagnosis not present

## 2014-05-16 DIAGNOSIS — F339 Major depressive disorder, recurrent, unspecified: Secondary | ICD-10-CM | POA: Diagnosis not present

## 2014-05-16 DIAGNOSIS — J029 Acute pharyngitis, unspecified: Secondary | ICD-10-CM | POA: Diagnosis not present

## 2014-06-10 DIAGNOSIS — F411 Generalized anxiety disorder: Secondary | ICD-10-CM | POA: Diagnosis not present

## 2014-06-15 DIAGNOSIS — I639 Cerebral infarction, unspecified: Secondary | ICD-10-CM

## 2014-06-15 HISTORY — DX: Cerebral infarction, unspecified: I63.9

## 2014-06-28 ENCOUNTER — Ambulatory Visit (INDEPENDENT_AMBULATORY_CARE_PROVIDER_SITE_OTHER): Payer: Medicare Other | Admitting: Psychiatry

## 2014-06-28 DIAGNOSIS — Z7189 Other specified counseling: Secondary | ICD-10-CM | POA: Diagnosis not present

## 2014-06-28 DIAGNOSIS — F411 Generalized anxiety disorder: Secondary | ICD-10-CM | POA: Diagnosis not present

## 2014-07-08 ENCOUNTER — Observation Stay (HOSPITAL_COMMUNITY): Payer: Medicare Other

## 2014-07-08 ENCOUNTER — Encounter (HOSPITAL_COMMUNITY): Payer: Self-pay | Admitting: General Practice

## 2014-07-08 ENCOUNTER — Emergency Department (HOSPITAL_COMMUNITY): Payer: Medicare Other

## 2014-07-08 ENCOUNTER — Inpatient Hospital Stay (HOSPITAL_COMMUNITY)
Admission: EM | Admit: 2014-07-08 | Discharge: 2014-07-10 | DRG: 065 | Disposition: A | Discharge status: Home Health | Payer: Medicare Other | Attending: Internal Medicine | Admitting: Internal Medicine

## 2014-07-08 DIAGNOSIS — I5032 Chronic diastolic (congestive) heart failure: Secondary | ICD-10-CM | POA: Diagnosis not present

## 2014-07-08 DIAGNOSIS — I6350 Cerebral infarction due to unspecified occlusion or stenosis of unspecified cerebral artery: Secondary | ICD-10-CM | POA: Diagnosis not present

## 2014-07-08 DIAGNOSIS — Z8249 Family history of ischemic heart disease and other diseases of the circulatory system: Secondary | ICD-10-CM | POA: Diagnosis not present

## 2014-07-08 DIAGNOSIS — R471 Dysarthria and anarthria: Secondary | ICD-10-CM | POA: Diagnosis not present

## 2014-07-08 DIAGNOSIS — E039 Hypothyroidism, unspecified: Secondary | ICD-10-CM | POA: Diagnosis present

## 2014-07-08 DIAGNOSIS — J45909 Unspecified asthma, uncomplicated: Secondary | ICD-10-CM | POA: Diagnosis present

## 2014-07-08 DIAGNOSIS — I639 Cerebral infarction, unspecified: Secondary | ICD-10-CM

## 2014-07-08 DIAGNOSIS — R29810 Facial weakness: Secondary | ICD-10-CM | POA: Diagnosis present

## 2014-07-08 DIAGNOSIS — I63542 Cerebral infarction due to unspecified occlusion or stenosis of left cerebellar artery: Principal | ICD-10-CM | POA: Diagnosis present

## 2014-07-08 DIAGNOSIS — G8324 Monoplegia of upper limb affecting left nondominant side: Secondary | ICD-10-CM | POA: Diagnosis present

## 2014-07-08 DIAGNOSIS — Z8619 Personal history of other infectious and parasitic diseases: Secondary | ICD-10-CM | POA: Diagnosis not present

## 2014-07-08 DIAGNOSIS — R222 Localized swelling, mass and lump, trunk: Secondary | ICD-10-CM

## 2014-07-08 DIAGNOSIS — I638 Other cerebral infarction: Secondary | ICD-10-CM | POA: Diagnosis not present

## 2014-07-08 DIAGNOSIS — F410 Panic disorder [episodic paroxysmal anxiety] without agoraphobia: Secondary | ICD-10-CM | POA: Diagnosis present

## 2014-07-08 DIAGNOSIS — R0602 Shortness of breath: Secondary | ICD-10-CM | POA: Diagnosis not present

## 2014-07-08 DIAGNOSIS — R918 Other nonspecific abnormal finding of lung field: Secondary | ICD-10-CM | POA: Diagnosis present

## 2014-07-08 DIAGNOSIS — I739 Peripheral vascular disease, unspecified: Secondary | ICD-10-CM | POA: Diagnosis present

## 2014-07-08 DIAGNOSIS — J471 Bronchiectasis with (acute) exacerbation: Secondary | ICD-10-CM

## 2014-07-08 DIAGNOSIS — R4781 Slurred speech: Secondary | ICD-10-CM | POA: Diagnosis not present

## 2014-07-08 DIAGNOSIS — E780 Pure hypercholesterolemia: Secondary | ICD-10-CM | POA: Diagnosis present

## 2014-07-08 DIAGNOSIS — Z7982 Long term (current) use of aspirin: Secondary | ICD-10-CM

## 2014-07-08 DIAGNOSIS — I6529 Occlusion and stenosis of unspecified carotid artery: Secondary | ICD-10-CM | POA: Diagnosis present

## 2014-07-08 DIAGNOSIS — R531 Weakness: Secondary | ICD-10-CM | POA: Diagnosis not present

## 2014-07-08 DIAGNOSIS — Z823 Family history of stroke: Secondary | ICD-10-CM | POA: Diagnosis not present

## 2014-07-08 DIAGNOSIS — I252 Old myocardial infarction: Secondary | ICD-10-CM | POA: Diagnosis not present

## 2014-07-08 DIAGNOSIS — I10 Essential (primary) hypertension: Secondary | ICD-10-CM | POA: Diagnosis not present

## 2014-07-08 DIAGNOSIS — I6789 Other cerebrovascular disease: Secondary | ICD-10-CM

## 2014-07-08 DIAGNOSIS — Z885 Allergy status to narcotic agent status: Secondary | ICD-10-CM

## 2014-07-08 DIAGNOSIS — Z66 Do not resuscitate: Secondary | ICD-10-CM | POA: Diagnosis present

## 2014-07-08 DIAGNOSIS — R05 Cough: Secondary | ICD-10-CM | POA: Diagnosis not present

## 2014-07-08 DIAGNOSIS — G459 Transient cerebral ischemic attack, unspecified: Secondary | ICD-10-CM | POA: Diagnosis present

## 2014-07-08 DIAGNOSIS — G609 Hereditary and idiopathic neuropathy, unspecified: Secondary | ICD-10-CM | POA: Diagnosis present

## 2014-07-08 DIAGNOSIS — J479 Bronchiectasis, uncomplicated: Secondary | ICD-10-CM | POA: Diagnosis not present

## 2014-07-08 DIAGNOSIS — I2510 Atherosclerotic heart disease of native coronary artery without angina pectoris: Secondary | ICD-10-CM | POA: Diagnosis present

## 2014-07-08 DIAGNOSIS — Z881 Allergy status to other antibiotic agents status: Secondary | ICD-10-CM | POA: Diagnosis not present

## 2014-07-08 DIAGNOSIS — F329 Major depressive disorder, single episode, unspecified: Secondary | ICD-10-CM | POA: Diagnosis present

## 2014-07-08 DIAGNOSIS — Z888 Allergy status to other drugs, medicaments and biological substances status: Secondary | ICD-10-CM

## 2014-07-08 DIAGNOSIS — A310 Pulmonary mycobacterial infection: Secondary | ICD-10-CM | POA: Diagnosis present

## 2014-07-08 DIAGNOSIS — F419 Anxiety disorder, unspecified: Secondary | ICD-10-CM | POA: Diagnosis present

## 2014-07-08 DIAGNOSIS — M8580 Other specified disorders of bone density and structure, unspecified site: Secondary | ICD-10-CM | POA: Diagnosis present

## 2014-07-08 DIAGNOSIS — Z8673 Personal history of transient ischemic attack (TIA), and cerebral infarction without residual deficits: Secondary | ICD-10-CM | POA: Diagnosis not present

## 2014-07-08 LAB — COMPREHENSIVE METABOLIC PANEL
ALT: 10 U/L — AB (ref 14–54)
ANION GAP: 10 (ref 5–15)
AST: 24 U/L (ref 15–41)
Albumin: 3.7 g/dL (ref 3.5–5.0)
Alkaline Phosphatase: 98 U/L (ref 38–126)
BUN: 11 mg/dL (ref 6–20)
CALCIUM: 8.6 mg/dL — AB (ref 8.9–10.3)
CO2: 24 mmol/L (ref 22–32)
CREATININE: 0.8 mg/dL (ref 0.44–1.00)
Chloride: 104 mmol/L (ref 101–111)
GFR calc non Af Amer: >60 mL/min (ref >60)
GLUCOSE: 100 mg/dL — AB (ref 65–99)
Potassium: 4.1 mmol/L (ref 3.5–5.1)
SODIUM: 138 mmol/L (ref 135–145)
TOTAL PROTEIN: 7 g/dL (ref 6.5–8.1)
Total Bilirubin: 0.5 mg/dL (ref 0.3–1.2)

## 2014-07-08 LAB — DIFFERENTIAL
Basophils Absolute: 0.1 10*3/uL (ref 0.0–0.1)
Basophils Relative: 1 % (ref 0–1)
EOS PCT: 7 % — AB (ref 0–5)
Eosinophils Absolute: 0.5 10*3/uL (ref 0.0–0.7)
LYMPHS ABS: 1.6 10*3/uL (ref 0.7–4.0)
LYMPHS PCT: 25 % (ref 12–46)
Monocytes Absolute: 0.5 10*3/uL (ref 0.1–1.0)
Monocytes Relative: 8 % (ref 3–12)
NEUTROS ABS: 3.7 10*3/uL (ref 1.7–7.7)
NEUTROS PCT: 59 % (ref 43–77)

## 2014-07-08 LAB — CBC
HCT: 34.2 % — ABNORMAL LOW (ref 36.0–46.0)
Hemoglobin: 10.4 g/dL — ABNORMAL LOW (ref 12.0–15.0)
MCH: 24.9 pg — AB (ref 26.0–34.0)
MCHC: 30.4 g/dL (ref 30.0–36.0)
MCV: 82 fL (ref 78.0–100.0)
PLATELETS: 244 10*3/uL (ref 150–400)
RBC: 4.17 MIL/uL (ref 3.87–5.11)
RDW: 14.3 % (ref 11.5–15.5)
WBC: 6.4 10*3/uL (ref 4.0–10.5)

## 2014-07-08 LAB — ETHANOL: Alcohol, Ethyl (B): <5 mg/dL (ref <5)

## 2014-07-08 LAB — URINALYSIS, ROUTINE W REFLEX MICROSCOPIC
Bilirubin Urine: NEGATIVE
Glucose, UA: NEGATIVE mg/dL
HGB URINE DIPSTICK: NEGATIVE
Ketones, ur: NEGATIVE mg/dL
Nitrite: NEGATIVE
PH: 7.5 (ref 5.0–8.0)
Protein, ur: NEGATIVE mg/dL
SPECIFIC GRAVITY, URINE: 1.006 (ref 1.005–1.030)
Urobilinogen, UA: 0.2 mg/dL (ref 0.0–1.0)

## 2014-07-08 LAB — I-STAT TROPONIN, ED: TROPONIN I, POC: 0 ng/mL (ref 0.00–0.08)

## 2014-07-08 LAB — RAPID URINE DRUG SCREEN, HOSP PERFORMED
AMPHETAMINES: NOT DETECTED
BARBITURATES: NOT DETECTED
BENZODIAZEPINES: POSITIVE — AB
Cocaine: NOT DETECTED
Opiates: NOT DETECTED
Tetrahydrocannabinol: NOT DETECTED

## 2014-07-08 LAB — I-STAT CHEM 8, ED
BUN: 13 mg/dL (ref 6–20)
CALCIUM ION: 1.05 mmol/L — AB (ref 1.13–1.30)
Chloride: 105 mmol/L (ref 101–111)
Creatinine, Ser: 0.9 mg/dL (ref 0.44–1.00)
GLUCOSE: 100 mg/dL — AB (ref 65–99)
HCT: 37 % (ref 36.0–46.0)
Hemoglobin: 12.6 g/dL (ref 12.0–15.0)
POTASSIUM: 4.1 mmol/L (ref 3.5–5.1)
SODIUM: 137 mmol/L (ref 135–145)
TCO2: 24 mmol/L (ref 0–100)

## 2014-07-08 LAB — PROTIME-INR
INR: 1.1 (ref 0.00–1.49)
Prothrombin Time: 14.4 seconds (ref 11.6–15.2)

## 2014-07-08 LAB — URINE MICROSCOPIC-ADD ON

## 2014-07-08 LAB — APTT: aPTT: 33 seconds (ref 24–37)

## 2014-07-08 MED ORDER — ASPIRIN 325 MG PO TABS
325.0000 mg | ORAL_TABLET | Freq: Every day | ORAL | Status: DC
  Administered 2014-07-08 – 2014-07-09 (×2): 325 mg via ORAL
  Filled 2014-07-08: qty 1

## 2014-07-08 MED ORDER — SERTRALINE HCL 50 MG PO TABS: 50.0000 mg | ORAL_TABLET | Freq: Every day | ORAL | Status: DC

## 2014-07-08 MED ORDER — STROKE: EARLY STAGES OF RECOVERY BOOK: Freq: Once | Status: DC

## 2014-07-08 MED ORDER — LEVALBUTEROL TARTRATE 45 MCG/ACT IN AERO: 1.0000 | INHALATION_SPRAY | Freq: Four times a day (QID) | RESPIRATORY_TRACT | Status: DC | PRN

## 2014-07-08 MED ORDER — LEVALBUTEROL HCL 0.63 MG/3ML IN NEBU: 0.6300 mg | INHALATION_SOLUTION | Freq: Four times a day (QID) | RESPIRATORY_TRACT | Status: DC | PRN

## 2014-07-08 MED ORDER — SERTRALINE HCL 50 MG PO TABS
25.0000 mg | ORAL_TABLET | Freq: Every day | ORAL | Status: DC
  Administered 2014-07-09: 25 mg via ORAL
  Filled 2014-07-08 (×2): qty 1

## 2014-07-08 MED ORDER — CLONAZEPAM 0.5 MG PO TABS
0.5000 mg | ORAL_TABLET | Freq: Every day | ORAL | Status: DC
  Administered 2014-07-08 – 2014-07-09 (×2): 0.5 mg via ORAL
  Filled 2014-07-08 (×2): qty 1

## 2014-07-08 MED ORDER — SODIUM CHLORIDE 0.9 % IV SOLN
INTRAVENOUS | Status: DC
  Administered 2014-07-09: 50 mL/h via INTRAVENOUS

## 2014-07-08 MED ORDER — SODIUM CHLORIDE 0.9 % IV SOLN
INTRAVENOUS | Status: AC
  Administered 2014-07-08: 12:00:00 via INTRAVENOUS

## 2014-07-08 MED ORDER — ENOXAPARIN SODIUM 40 MG/0.4ML ~~LOC~~ SOLN
40.0000 mg | SUBCUTANEOUS | Status: DC
  Administered 2014-07-08 – 2014-07-09 (×2): 40 mg via SUBCUTANEOUS
  Filled 2014-07-08 (×2): qty 0.4

## 2014-07-08 MED ORDER — LEVOTHYROXINE SODIUM 50 MCG PO TABS
50.0000 ug | ORAL_TABLET | Freq: Every day | ORAL | Status: DC
  Administered 2014-07-09 – 2014-07-10 (×2): 50 ug via ORAL
  Filled 2014-07-08 (×2): qty 1

## 2014-07-08 MED ORDER — ALPRAZOLAM 0.5 MG PO TABS
0.5000 mg | ORAL_TABLET | Freq: Three times a day (TID) | ORAL | Status: DC | PRN
  Administered 2014-07-08 – 2014-07-10 (×4): 0.5 mg via ORAL
  Filled 2014-07-08 (×4): qty 1

## 2014-07-08 NOTE — Consult Note (Signed)
Referring Physician: Mcmanus    Chief Complaint: Left arm weakness and dysarthria  HPI:                                                                                                                                         Sarah [name] is an 81 y.o. female with history of multiple TIA's in the past.  She awoke this AM and noted at 0745 she had a sudden onset of left arm weakness while brushing her hair.  This weakness persisted and she called EMS.  EMS arrived and noted a left facial droop, left arm weakness and dysarthria/expressive aphasia.  Code stroke was called and patient was brought to ED. On arrival she had a left facial droop and dysarthria. tPA was not given do to minimal symptoms.  Denies HA, vertigo, double vision, difficulty swallowing, or vision impairment.  Date last known well: Date: 07/08/2014 Time last known well: Time: 07:45 tPA Given: No: minimal symptoms     Past Medical History  Diagnosis Date  . Bronchiectasis     with history of Legionaires with chronic rales/rhonchi  . Pollen allergies   . Hypertension     Negative renal duplex in December of 2012  . Allergic rhinitis   . Diastolic dysfunction     a. per echo 08/2010 with normal LV function;  b. 06/2011 Echo: EF 65-70%  . Idiopathic peripheral neuropathy   . Hypothyroidism   . Depression   . H/O hiatal hernia   . Osteopenia 02/2011    t score - 2.1  . Hypercholesteremia   . Chest pain at rest   . NSTEMI (non-ST elevated myocardial infarction)     a. Normal coronaries per cath August 2012 and negative CT angio for PE;  b. 06/2011 Repeat admission w/ chest pain and elevated troponin's, CTA Chest w/o PE  . Asthma   . MAC (mycobacterium avium-intracellulare complex)   . Shortness of breath     "sometimes just lying down" (04/15/2012)  . Pneumonia     "recurrent" (04/15/2012)  . H/O Legionnaire's disease 11/1976  . History of blood transfusion 1972    "w/hysterectomy" (04/15/2012)  . GERD (gastroesophageal  reflux disease)     "just recently" (04/15/2012)  . Daily headache     "over the last week" 04/15/2012   . Migraines     "outgrew them" (04/15/2012)  . Seizures 1930's    "3-4 as a preteen" (04/15/2012)  . Arthritis     "a little; in my back and hands" (04/15/2012)  . Anxiety     Anxiety attacks    Past Surgical History  Procedure Laterality Date  . Dilation and curettage of uterus    . Appendectomy    . Cardiac catheterization  August 2012    Normal coronaries.  . Tonsillectomy  1930  . Total abdominal hysterectomy w/ bilateral salpingoophorectomy  1972  leiomyomata, menorrhagia    Family History  Problem Relation Age of Onset  . Hypertension Mother     stroke  . Stroke Mother   . Cancer Brother     prostate  . Osteoarthritis Paternal Aunt    Social History:  reports that she has never smoked. She has never used smokeless tobacco. She reports that she drinks about 1.2 oz of alcohol per week. She reports that she does not use illicit drugs.  Family history: no brain tumors, brain aneurysms, or MS  Allergies:  Allergies  Allergen Reactions  . Hydrocodone Nausea And Vomiting and Other (See Comments)    Passed out, threw up, blood pressure dropped  . Levofloxacin Other (See Comments)    Pass out, vomiting  . Oxycodone Other (See Comments)    Rapid heart beat  . Clarithromycin Other (See Comments)    Vomiting  . Isosorbide Other (See Comments)    Heart rate drops    Medications:                                                                                                                           No current facility-administered medications for this encounter.   Current Outpatient Prescriptions  Medication Sig Dispense Refill  . acetaminophen-codeine (TYLENOL #3) 300-30 MG per tablet Take 1 tablet by mouth 4 (four) times daily as needed (anxiety).     . ALPRAZolam (XANAX) 0.5 MG tablet Take 0.5 mg by mouth 3 (three) times daily as needed for anxiety.     Marland Kitchen aspirin  325 MG tablet Take 325 mg by mouth daily.    . clonazePAM (KLONOPIN) 0.5 MG tablet Take 0.5 mg by mouth at bedtime.     . levalbuterol (XOPENEX HFA) 45 MCG/ACT inhaler Inhale 1-2 puffs into the lungs every 6 (six) hours as needed for wheezing.    Marland Kitchen levothyroxine (SYNTHROID, LEVOTHROID) 50 MCG tablet Take 50 mcg by mouth daily.      . metoprolol tartrate (LOPRESSOR) 25 MG tablet Take 0.5 tablets (12.5 mg total) by mouth 2 (two) times daily. 30 tablet 3  . MINIVELLE 0.05 MG/24HR patch PLACE 1 PATCH ONTO SKIN ONCE A WEEK(THURSDAY) 8 patch 11  . nitroGLYCERIN (NITROSTAT) 0.4 MG SL tablet Place 0.4 mg under the tongue every 5 (five) minutes as needed for chest pain.    Marland Kitchen sertraline (ZOLOFT) 50 MG tablet Take 50 mg by mouth daily.       ROS:  History obtained from the patient  General ROS: negative for - chills, fatigue, fever, night sweats, weight gain or weight loss Psychological ROS: negative for - behavioral disorder, hallucinations, memory difficulties, mood swings or suicidal ideation Ophthalmic ROS: negative for - blurry vision, double vision, eye pain or loss of vision ENT ROS: negative for - epistaxis, nasal discharge, oral lesions, sore throat, tinnitus or vertigo Allergy and Immunology ROS: negative for - hives or itchy/watery eyes Hematological and Lymphatic ROS: negative for - bleeding problems, bruising or swollen lymph nodes Endocrine ROS: negative for - galactorrhea, hair pattern changes, polydipsia/polyuria or temperature intolerance Respiratory ROS: negative for - cough, hemoptysis, shortness of breath or wheezing Cardiovascular ROS: negative for - chest pain, dyspnea on exertion, edema or irregular heartbeat Gastrointestinal ROS: negative for - abdominal pain, diarrhea, hematemesis, nausea/vomiting or stool incontinence Genito-Urinary ROS: negative  for - dysuria, hematuria, incontinence or urinary frequency/urgency Musculoskeletal ROS: negative for - joint swelling or muscular weakness Neurological ROS: as noted in HPI Dermatological ROS: negative for rash and skin lesion changes  Physical Examination:   Blood pressure 164/93, pulse 61, temperature 97.6 F (36.4 C), temperature source Oral, resp. rate 16, height  (1.702 m), weight 56.972 kg (125 lb 9.6 oz),        HEENT-  Normocephalic, no lesions, without obvious abnormality.  Normal external eye and conjunctiva.  Normal TM's bilaterally.  Normal auditory canals and external ears. Normal external nose, mucus membranes and septum.  Normal pharynx. Cardiovascular- S1, S2 normal, pulses palpable throughout   Lungs- chest clear, no wheezing, rales, normal symmetric air entry Abdomen- normal findings: bowel sounds normal Extremities- no edema Lymph-no adenopathy palpable Musculoskeletal-no joint tenderness, deformity or swelling Skin-warm and dry, no hyperpigmentation, vitiligo, or suspicious lesions  Neurological Examination Mental Status: Alert, oriented, thought content appropriate.  Speech dysarthric without evidence of aphasia.  Able to follow 3 step commands without difficulty. Cranial Nerves: II: Discs flat bilaterally; Visual fields grossly normal, pupils equal, round, reactive to light and accommodation III,IV, VI: ptosis not present, extra-ocular motions intact bilaterally V,VII: smile asymmetric on the left, facial light touch sensation decreased on the left  VIII: hearing normal bilaterally IX,X: uvula rises symmetrically XI: bilateral shoulder shrug XII: midline tongue extension Motor: Right : Upper extremity   5/5    Left:     Upper extremity   5/5  Lower extremity   5/5     Lower extremity   5/5 Tone and bulk:normal tone throughout; no atrophy noted Sensory: Pinprick and light touch intact throughout, bilaterally Deep Tendon Reflexes: 2+ and symmetric throughout  with no AJ Plantars: Right: downgoing   Left: downgoing Cerebellar: normal finger-to-nose, and normal heel-to-shin test Gait: not tested       Lab Results: Basic Metabolic Panel:  Recent Labs Lab 07/08/14 1009  NA 137  K 4.1  CL 105  GLUCOSE 100*  BUN 13  CREATININE 0.90    Liver Function Tests: No results for input(s): AST, ALT, ALKPHOS, BILITOT, PROT, ALBUMIN in the last 168 hours. No results for input(s): LIPASE, AMYLASE in the last 168 hours. No results for input(s): AMMONIA in the last 168 hours.  CBC:  Recent Labs Lab 07/08/14 1005 07/08/14 1009  WBC 6.4  --   NEUTROABS 3.7  --   HGB 10.4* 12.6  HCT 34.2* 37.0  MCV 82.0  --   PLT 244  --     Cardiac Enzymes: No results for input(s): CKTOTAL, CKMB, CKMBINDEX, TROPONINI in the last  168 hours.  Lipid Panel: No results for input(s): CHOL, TRIG, HDL, CHOLHDL, VLDL, LDLCALC in the last 168 hours.  CBG: No results for input(s): GLUCAP in the last 168 hours.  Microbiology: Results for orders placed or performed during the hospital encounter of 06/18/11  Urine culture     Status: None   Collection Time: 06/19/11 12:18 AM  Result Value Ref Range Status   Specimen Description URINE, RANDOM  Final   Special Requests Normal  Final   Culture  Setup Time [id number]  Final   Colony Count NO GROWTH  Final   Culture NO GROWTH  Final   Report Status 06/20/2011 FINAL  Final    Coagulation Studies: No results for input(s): LABPROT, INR in the last 72 hours.  Imaging: No results found.     Assessment and plan discussed with with attending physician and they are in agreement.    Felicie Morn PA-C [facility] [phone]  07/08/2014, 10:22 AM   Assessment: 81 y.o. female presenting to ED as code stroke with left facial droop and dysarthria.  Symptoms improving and felt to mild for tPA.  CT head showed no acute abnormalities. Given symptoms cannot rule out TIA/CVA.   Stroke Risk Factors -  TIA, hyperlipidemia and hypertension  Recommend:  1. HgbA1c, fasting lipid panel 2. MRI, MRA  of the brain without contrast 3. PT consult, OT consult, Speech consult 4. Echocardiogram 5. Carotid dopplers 6. Prophylactic therapy-Antiplatelet med: Aspirin - dose 325 mg daily 7. Risk factor modification 8. Telemetry monitoring 9. Frequent neuro checks 10. NPO until passes stroke swallow screen  Patient seen and examined together with physician assistant and I concur with the assessment and plan.  Wyatt Portela, MD

## 2014-07-08 NOTE — Progress Notes (Signed)
  Echocardiogram 2D Echocardiogram has been performed.  Delcie Roch 07/08/2014, 5:23 PM

## 2014-07-08 NOTE — ED Notes (Signed)
Pt brought in via [facility] with complaints of left sided weakness, left facial droop, and slurred speech. Pt lives at [address] [place]. Pt woke up at 0745 and felt fine initially. Pt was doing her hair and fell, that's when symptoms started. Pt came in ED as a code stroke. Pt is currently A/O, but does have some dysarthria, and some left facial droop.

## 2014-07-08 NOTE — ED Provider Notes (Signed)
CSN: [id number]     Arrival date & time 07/08/14  1017 History   First MD Initiated Contact with Patient 07/08/14 1014     Chief Complaint  Patient presents with  . Code Stroke    An emergency department physician performed an initial assessment on this suspected stroke patient at 1000 ([place]).  HPI Pt was seen at 1015. Per EMS, NH report, and pt: c/o sudden onset and persistence of constant left arm weakness, left sided facial droop and slurred speech that began at 0745 PTA. Pt states she was "doing my hair" when her symptoms began. States she fell down at onset of her symptoms. EMS called Code Stroke en route. Pt's left arm weakness resolved by arrival to the ED. Denies headache, no syncope, no CP/palpiations, no SOB/cough, no abd pain, no headache, no neck or back pain.    Past Medical History  Diagnosis Date  . Bronchiectasis     with history of Legionaires with chronic rales/rhonchi  . Pollen allergies   . Hypertension     Negative renal duplex in December of 2012  . Allergic rhinitis   . Diastolic dysfunction     a. per echo 08/2010 with normal LV function;  b. 06/2011 Echo: EF 65-70%  . Idiopathic peripheral neuropathy   . Hypothyroidism   . Depression   . H/O hiatal hernia   . Osteopenia 02/2011    t score - 2.1  . Hypercholesteremia   . Chest pain at rest   . NSTEMI (non-ST elevated myocardial infarction)     a. Normal coronaries per cath August 2012 and negative CT angio for PE;  b. 06/2011 Repeat admission w/ chest pain and elevated troponin's, CTA Chest w/o PE  . Asthma   . MAC (mycobacterium avium-intracellulare complex)   . Shortness of breath     "sometimes just lying down" (04/15/2012)  . Pneumonia     "recurrent" (04/15/2012)  . H/O Legionnaire's disease 11/1976  . History of blood transfusion 1972    "w/hysterectomy" (04/15/2012)  . GERD (gastroesophageal reflux disease)     "just recently" (04/15/2012)  . Daily headache     "over the last week" 04/15/2012   .  Migraines     "outgrew them" (04/15/2012)  . Seizures 1930's    "3-4 as a preteen" (04/15/2012)  . Arthritis     "a little; in my back and hands" (04/15/2012)  . Anxiety     Anxiety attacks   Past Surgical History  Procedure Laterality Date  . Dilation and curettage of uterus    . Appendectomy    . Cardiac catheterization  August 2012    Normal coronaries.  . Tonsillectomy  1930  . Total abdominal hysterectomy w/ bilateral salpingoophorectomy  1972    leiomyomata, menorrhagia   Family History  Problem Relation Age of Onset  . Hypertension Mother     stroke  . Stroke Mother   . Cancer Brother     prostate  . Osteoarthritis Paternal Aunt    History  Substance Use Topics  . Smoking status: Never Smoker   . Smokeless tobacco: Never Used  . Alcohol Use: 1.2 oz/week    2 Glasses of wine per week     Comment: wine 1-2 week   OB History    Gravida Para Term Preterm AB TAB SAB Ectopic Multiple Living   Review of Systems ROS: Statement: All systems negative  except as marked or noted in the HPI; Constitutional: Negative for fever and chills. ; ; Eyes: Negative for eye pain, redness and discharge. ; ; ENMT: Negative for ear pain, hoarseness, nasal congestion, sinus pressure and sore throat. ; ; Cardiovascular: Negative for chest pain, palpitations, diaphoresis, dyspnea and peripheral edema. ; ; Respiratory: Negative for cough, wheezing and stridor. ; ; Gastrointestinal: Negative for nausea, vomiting, diarrhea, abdominal pain, blood in stool, hematemesis, jaundice and rectal bleeding. . ; ; Genitourinary: Negative for dysuria, flank pain and hematuria. ; ; Musculoskeletal: Negative for back pain and neck pain. Negative for swelling and trauma.; ; Skin: Negative for pruritus, rash, abrasions, blisters, bruising and skin lesion.; ; Neuro: +weakness, slurred speech, left facial droop. Negative for headache, lightheadedness and neck stiffness. Negative for altered level of  consciousness , altered mental status, paresthesias, involuntary movement, seizure and syncope.      Allergies  Codeine; Hydrocodone; Levofloxacin; Oxycodone; Clarithromycin; and Isosorbide  Home Medications   Prior to Admission medications   Medication Sig Start Date End Date Taking? Authorizing Provider  acetaminophen-codeine (TYLENOL #3) 300-30 MG per tablet Take 1 tablet by mouth 4 (four) times daily as needed (anxiety).  12/01/10   Historical Provider, MD  ALPRAZolam Prudy Feeler) 0.5 MG tablet Take 0.5 mg by mouth 3 (three) times daily as needed for anxiety.     Historical Provider, MD  aspirin 325 MG tablet Take 325 mg by mouth daily.    Historical Provider, MD  clonazePAM (KLONOPIN) 0.5 MG tablet Take 0.5 mg by mouth at bedtime.  04/06/13   Historical Provider, MD  levalbuterol Pauline Aus HFA) 45 MCG/ACT inhaler Inhale 1-2 puffs into the lungs every 6 (six) hours as needed for wheezing.    Historical Provider, MD  levothyroxine (SYNTHROID, LEVOTHROID) 50 MCG tablet Take 50 mcg by mouth daily.      Historical Provider, MD  metoprolol tartrate (LOPRESSOR) 25 MG tablet Take 0.5 tablets (12.5 mg total) by mouth 2 (two) times daily. 02/21/14   Vesta Mixer, MD  MINIVELLE 0.05 MG/24HR patch PLACE 1 PATCH ONTO SKIN ONCE A WEEK(THURSDAY) 11/12/13   Dara Lords, MD  nitroGLYCERIN (NITROSTAT) 0.4 MG SL tablet Place 0.4 mg under the tongue every 5 (five) minutes as needed for chest pain.    Historical Provider, MD  sertraline (ZOLOFT) 50 MG tablet Take 50 mg by mouth daily.    Historical Provider, MD   BP 186/86 mmHg  Pulse 66  Temp(Src) 97.8 F (36.6 C) (Oral)  Resp 20  Ht  (1.702 m)  Wt 125 lb 9.6 oz (56.972 kg)  BMI 19.67 kg/m2  SpO2 97%  LMP 01/14/1970 Physical Exam  1115: Physical examination:  Nursing notes reviewed; Vital signs and O2 SAT reviewed;  Constitutional: Well developed, Well nourished, Well hydrated, In no acute distress; Head:  Normocephalic, atraumatic; Eyes:  EOMI, PERRL, No scleral icterus; ENMT: Mouth and pharynx normal, Mucous membranes moist; Neck: Supple, Full range of motion, No lymphadenopathy; Cardiovascular: Regular rate and rhythm, No gallop; Respiratory: Breath sounds clear & equal bilaterally, No wheezes.  Speaking full sentences with ease, Normal respiratory effort/excursion; Chest: Nontender, Movement normal; Abdomen: Soft, Nontender, Nondistended, Normal bowel sounds; Genitourinary: No CVA tenderness; Extremities: Pulses normal, No tenderness, No edema, No calf edema or asymmetry.; Neuro: AA&Ox3,Major CN grossly intact. Speech slurred.  +mild left facial droop.  No nystagmus. Grips equal. Strength 5/5 equal bilat UE's and LE's.  DTR 2/4 equal bilat UE's and LE's.  +decreased sensation LUE, otherwise no  gross sensory deficits.  Normal cerebellar testing bilat UE's (finger-nose) and LE's (heel-shin)..; Skin: Color normal, Warm, Dry.   ED Course  Procedures     EKG Interpretation None      MDM  MDM Reviewed: previous chart, nursing note and vitals Reviewed previous: labs and ECG Interpretation: labs, ECG and CT scan Total time providing critical care: 30-74 minutes. This excludes time spent performing separately reportable procedures and services. Consults: neurology and admitting MD   CRITICAL CARE Performed by: Laray Anger Total critical care time: 35 Critical care time was exclusive of separately billable procedures and treating other patients. Critical care was necessary to treat or prevent imminent or life-threatening deterioration. Critical care was time spent personally by me on the following activities: development of treatment plan with patient and/or surrogate as well as nursing, discussions with consultants, evaluation of patient's response to treatment, examination of patient, obtaining history from patient or surrogate, ordering and performing treatments and interventions, ordering and review of laboratory  studies, ordering and review of radiographic studies, pulse oximetry and re-evaluation of patient's condition.   ED ECG REPORT   Date: 07/08/2014  Rate: 69  Rhythm: normal sinus rhythm  QRS Axis: normal  Intervals: normal  ST/T Wave abnormalities: normal  Conduction Disutrbances:none  Narrative Interpretation:   Old EKG Reviewed: changed; compared to previous EKG dated 04/04/13, PVC's are no longer present.  I have personally reviewed the EKG tracing and agree with the computerized printout as noted.   Results for orders placed or performed during the hospital encounter of 07/08/14  Ethanol  Result Value Ref Range   Alcohol, Ethyl (B) <5 <5 mg/dL  Protime-INR  Result Value Ref Range   Prothrombin Time 14.4 11.6 - 15.2 seconds   INR 1.10 0.00 - 1.49  APTT  Result Value Ref Range   aPTT 33 24 - 37 seconds  CBC  Result Value Ref Range   WBC 6.4 4.0 - 10.5 K/uL   RBC 4.17 3.87 - 5.11 MIL/uL   Hemoglobin 10.4 (L) 12.0 - 15.0 g/dL   HCT 40.9 (L) 81.1 - 91.4 %   MCV 82.0 78.0 - 100.0 fL   MCH 24.9 (L) 26.0 - 34.0 pg   MCHC 30.4 30.0 - 36.0 g/dL   RDW 78.2 95.6 - 21.3 %   Platelets 244 150 - 400 K/uL  Differential  Result Value Ref Range   Neutrophils Relative % 59 43 - 77 %   Neutro Abs 3.7 1.7 - 7.7 K/uL   Lymphocytes Relative 25 12 - 46 %   Lymphs Abs 1.6 0.7 - 4.0 K/uL   Monocytes Relative 8 3 - 12 %   Monocytes Absolute 0.5 0.1 - 1.0 K/uL   Eosinophils Relative 7 (H) 0 - 5 %   Eosinophils Absolute 0.5 0.0 - 0.7 K/uL   Basophils Relative 1 0 - 1 %   Basophils Absolute 0.1 0.0 - 0.1 K/uL  Comprehensive metabolic panel  Result Value Ref Range   Sodium 138 135 - 145 mmol/L   Potassium 4.1 3.5 - 5.1 mmol/L   Chloride 104 101 - 111 mmol/L   CO2 24 22 - 32 mmol/L   Glucose, Bld 100 (H) 65 - 99 mg/dL   BUN 11 6 - 20 mg/dL   Creatinine, Ser 0.86 0.44 - 1.00 mg/dL   Calcium 8.6 (L) 8.9 - 10.3 mg/dL   Total Protein 7.0 6.5 - 8.1 g/dL   Albumin 3.7 3.5 - 5.0 g/dL   AST  24  15 - 41 U/L   ALT 10 (L) 14 - 54 U/L   Alkaline Phosphatase 98 38 - 126 U/L   Total Bilirubin 0.5 0.3 - 1.2 mg/dL   GFR calc non Af Amer >60 >60 mL/min   GFR calc Af Amer >60 >60 mL/min   Anion gap 10 5 - 15  I-Stat Chem 8, ED  (not at [facility], [facility])  Result Value Ref Range   Sodium 137 135 - 145 mmol/L   Potassium 4.1 3.5 - 5.1 mmol/L   Chloride 105 101 - 111 mmol/L   BUN 13 6 - 20 mg/dL   Creatinine, Ser 9.67 0.44 - 1.00 mg/dL   Glucose, Bld 893 (H) 65 - 99 mg/dL   Calcium, Ion 8.10 (L) 1.13 - 1.30 mmol/L   TCO2 24 0 - 100 mmol/L   Hemoglobin 12.6 12.0 - 15.0 g/dL   HCT 17.5 10.2 - 58.5 %  I-stat troponin, ED (not at [facility], [facility])  Result Value Ref Range   Troponin i, poc 0.00 0.00 - 0.08 ng/mL   Comment 3           Ct Head Wo Contrast 07/08/2014   CLINICAL DATA:  Code stroke. LEFT-sided weakness which began earlier today. Speech difficulty persists.  EXAM: CT HEAD WITHOUT CONTRAST  TECHNIQUE: Contiguous axial images were obtained from the base of the skull through the vertex without intravenous contrast.  COMPARISON:  MRI 01/15/2014.  CT head 01/15/2014.  FINDINGS: No evidence for acute infarction, hemorrhage, mass lesion, hydrocephalus, or extra-axial fluid. Advanced atrophy not unexpected for age. Chronic microvascular ischemic change in the periventricular and subcortical white matter.  Calvarium intact. No sinus or mastoid disease. Vascular calcification proximally.  Bulging of the RIGHT cavernous sinus reflects the previously documented ICA aneurysm on the RIGHT. Correlate clinically for a source of emboli to the RIGHT hemisphere.  IMPRESSION: No acute intracranial findings.  Chronic changes as described.  Critical Value/emergent results were called by telephone at the time of interpretation on 07/08/2014 at 10:25 Am to Dr. Wyatt Portela , who verbally acknowledged these results.   Electronically Signed   By: Elsie Stain M.D.   On: 07/08/2014 10:32    1015:  Code Stroke called by EMS  PTA. Neuro Team at bedside for evaluation: states NIH score 3, no intervention at this time due to mild symptoms, requests to admit to medicine service for stroke workup.  1145:  Dx and testing d/w pt and family.  Questions answered.  Verb understanding, agreeable to admit. T/C to [facility] Dr. Sharl Ma, case discussed, including:  HPI, pertinent PM/SHx, VS/PE, dx testing, ED course and treatment:  Agreeable to admit, requests to write temporary orders, obtain tele bed to team MCAdmits.   Samuel Jester, DO 07/10/14 1419

## 2014-07-08 NOTE — ED Notes (Signed)
MD at bedside. 

## 2014-07-08 NOTE — H&P (Signed)
PCP:   Emeterio Reeve, MD   Chief Complaint:  Dysarthria  HPI: 81 year old female who   has a past medical history of Bronchiectasis; Pollen allergies; Hypertension; Allergic rhinitis; Diastolic dysfunction; Idiopathic peripheral neuropathy; Hypothyroidism; Depression; H/O hiatal hernia; Osteopenia (02/2011); Hypercholesteremia; Chest pain at rest; NSTEMI (non-ST elevated myocardial infarction); Asthma; MAC (mycobacterium avium-intracellulare complex); Shortness of breath; Pneumonia; H/O Legionnaire's disease (11/1976); History of blood transfusion (1972); GERD (gastroesophageal reflux disease); Daily headache; Migraines; Seizures (1930's); Arthritis; and Anxiety. Today was brought to the hospital after patient had an episode of weakness while she was brushing her hair this morning. Patient says that she was unable to speak for a while and became very anxious, she fell on the ground but did not lose consciousness. Patient says that she crawled over and pressed the life alert button and the nurse arrived she noticed that patient was having dysarthria and expressive aphasia so she called EMS. Patient has a history of TIA in the past, code stroke was called and was seen by neurology. Due to minimal symptoms no TPA was administered. CT head did not show stroke. Patient now being admitted for further evaluation for TIA. She denies nausea vomiting or diarrhea. There were no seizure-like activity, did not lose bowel or bladder control. No chest pain or shortness of breath. Denies dysuria.  Allergies:   Allergies  Allergen Reactions  . Codeine Anaphylaxis  . Hydrocodone Nausea And Vomiting and Other (See Comments)    Passed out, threw up, blood pressure dropped  . Levofloxacin Other (See Comments)    Pass out, vomiting  . Oxycodone Other (See Comments)    Rapid heart beat  . Clarithromycin Other (See Comments)    Vomiting  . Isosorbide Other (See Comments)    Heart rate drops      Past  Medical History  Diagnosis Date  . Bronchiectasis     with history of Legionaires with chronic rales/rhonchi  . Pollen allergies   . Hypertension     Negative renal duplex in December of 2012  . Allergic rhinitis   . Diastolic dysfunction     a. per echo 08/2010 with normal LV function;  b. 06/2011 Echo: EF 65-70%  . Idiopathic peripheral neuropathy   . Hypothyroidism   . Depression   . H/O hiatal hernia   . Osteopenia 02/2011    t score - 2.1  . Hypercholesteremia   . Chest pain at rest   . NSTEMI (non-ST elevated myocardial infarction)     a. Normal coronaries per cath August 2012 and negative CT angio for PE;  b. 06/2011 Repeat admission w/ chest pain and elevated troponin's, CTA Chest w/o PE  . Asthma   . MAC (mycobacterium avium-intracellulare complex)   . Shortness of breath     "sometimes just lying down" (04/15/2012)  . Pneumonia     "recurrent" (04/15/2012)  . H/O Legionnaire's disease 11/1976  . History of blood transfusion 1972    "w/hysterectomy" (04/15/2012)  . GERD (gastroesophageal reflux disease)     "just recently" (04/15/2012)  . Daily headache     "over the last week" 04/15/2012   . Migraines     "outgrew them" (04/15/2012)  . Seizures 1930's    "3-4 as a preteen" (04/15/2012)  . Arthritis     "a little; in my back and hands" (04/15/2012)  . Anxiety     Anxiety attacks    Past Surgical History  Procedure Laterality Date  . Dilation and curettage of  uterus    . Appendectomy    . Cardiac catheterization  August 2012    Normal coronaries.  . Tonsillectomy  1930  . Total abdominal hysterectomy w/ bilateral salpingoophorectomy  1972    leiomyomata, menorrhagia    Prior to Admission medications   Medication Sig Start Date End Date Taking? Authorizing Provider  acetaminophen-codeine (TYLENOL #3) 300-30 MG per tablet Take 1 tablet by mouth 4 (four) times daily as needed (anxiety).  12/01/10   Historical Provider, MD  ALPRAZolam Prudy Feeler) 0.5 MG tablet Take 0.5 mg by  mouth 3 (three) times daily as needed for anxiety.     Historical Provider, MD  aspirin 325 MG tablet Take 325 mg by mouth daily.    Historical Provider, MD  clonazePAM (KLONOPIN) 0.5 MG tablet Take 0.5 mg by mouth at bedtime.  04/06/13   Historical Provider, MD  levalbuterol Pauline Aus HFA) 45 MCG/ACT inhaler Inhale 1-2 puffs into the lungs every 6 (six) hours as needed for wheezing.    Historical Provider, MD  levothyroxine (SYNTHROID, LEVOTHROID) 50 MCG tablet Take 50 mcg by mouth daily.      Historical Provider, MD  metoprolol tartrate (LOPRESSOR) 25 MG tablet Take 0.5 tablets (12.5 mg total) by mouth 2 (two) times daily. 02/21/14   Vesta Mixer, MD  MINIVELLE 0.05 MG/24HR patch PLACE 1 PATCH ONTO SKIN ONCE A WEEK(THURSDAY) 11/12/13   Dara Lords, MD  nitroGLYCERIN (NITROSTAT) 0.4 MG SL tablet Place 0.4 mg under the tongue every 5 (five) minutes as needed for chest pain.    Historical Provider, MD  sertraline (ZOLOFT) 50 MG tablet Take 50 mg by mouth daily.    Historical Provider, MD    Social History:  reports that she has never smoked. She has never used smokeless tobacco. She reports that she drinks about 1.2 oz of alcohol per week. She reports that she does not use illicit drugs.  Family History  Problem Relation Age of Onset  . Hypertension Mother     stroke  . Stroke Mother   . Cancer Brother     prostate  . Osteoarthritis Paternal Edwin Cap Weights   07/08/14 1023  Weight: 56.972 kg (125 lb 9.6 oz)    All the positives are listed in BOLD  Review of Systems:  HEENT: Headache, blurred vision, runny nose, sore throat Neck: Hypothyroidism, hyperthyroidism,,lymphadenopathy Chest : Shortness of breath, history of COPD, Asthma, history of bronchiectasis Heart : Chest pain, history of coronary arterey disease GI:  Nausea, vomiting, diarrhea, constipation, GERD GU: Dysuria, urgency, frequency of urination, hematuria Neuro: Stroke, seizures, syncope Psych:  Depression, anxiety, hallucinations   Physical Exam: Blood pressure 164/93, pulse 61, temperature 97.6 F (36.4 C), temperature source Oral, resp. rate 16, height  (1.702 m), weight 56.972 kg (125 lb 9.6 oz), last menstrual period 01/14/1970, SpO2 96 %. Constitutional:   Patient is a well-developed and well-nourished *female in no acute distress and cooperative with exam. Head: Normocephalic and atraumatic Mouth: Mucus membranes moist Eyes: PERRL, EOMI, conjunctivae normal Neck: Supple, No Thyromegaly Cardiovascular: RRR, S1 normal, S2 normal Pulmonary/Chest: Bibasilar crackles Abdominal: Soft. Non-tender, non-distended, bowel sounds are normal, no masses, organomegaly, or guarding present.  Neurological: A&O x3, Strength is normal and symmetric bilaterally, cranial nerve II-XII are grossly intact, no focal motor deficit, sensory intact to light touch bilaterally.  Extremities : No Cyanosis, Clubbing or Edema  Labs on Admission:  Basic Metabolic Panel:  Recent Labs Lab 07/08/14 1005 07/08/14 1009  NA 138 137  K 4.1 4.1  CL 104 105  CO2 24  --   GLUCOSE 100* 100*  BUN 11 13  CREATININE 0.80 0.90  CALCIUM 8.6*  --    Liver Function Tests:  Recent Labs Lab 07/08/14 1005  AST 24  ALT 10*  ALKPHOS 98  BILITOT 0.5  PROT 7.0  ALBUMIN 3.7   No results for input(s): LIPASE, AMYLASE in the last 168 hours. No results for input(s): AMMONIA in the last 168 hours. CBC:  Recent Labs Lab 07/08/14 1005 07/08/14 1009  WBC 6.4  --   NEUTROABS 3.7  --   HGB 10.4* 12.6  HCT 34.2* 37.0  MCV 82.0  --   PLT 244  --     Radiological Exams on Admission: Ct Head Wo Contrast  07/08/2014   CLINICAL DATA:  Code stroke. LEFT-sided weakness which began earlier today. Speech difficulty persists.  EXAM: CT HEAD WITHOUT CONTRAST  TECHNIQUE: Contiguous axial images were obtained from the base of the skull through the vertex without intravenous contrast.  COMPARISON:  MRI 01/15/2014.   CT head 01/15/2014.  FINDINGS: No evidence for acute infarction, hemorrhage, mass lesion, hydrocephalus, or extra-axial fluid. Advanced atrophy not unexpected for age. Chronic microvascular ischemic change in the periventricular and subcortical white matter.  Calvarium intact. No sinus or mastoid disease. Vascular calcification proximally.  Bulging of the RIGHT cavernous sinus reflects the previously documented ICA aneurysm on the RIGHT. Correlate clinically for a source of emboli to the RIGHT hemisphere.  IMPRESSION: No acute intracranial findings.  Chronic changes as described.  Critical Value/emergent results were called by telephone at the time of interpretation on 07/08/2014 at 10:25 Am to Dr. Wyatt Portela , who verbally acknowledged these results.   Electronically Signed   By: Elsie Stain M.D.   On: 07/08/2014 10:32       Assessment/Plan Active Problems:   Stroke   TIA (transient ischemic attack)  TIA versus stroke Admit the patient for stroke workup, and check MRI/MRA brain, echocardiogram, carotid ultrasound. Neurology has seen the patient. Continue aspirin 325 mg by mouth daily Check fasting lipid panel and hemoglobin A1c in a.m.  History of bronchiectasis Continue Xopenex nebulizers every 4 hours when necessary  Hypertension Hold metoprolol for permissive hypertension  Hormone therapy Patient takes estradiol patch, will hold the patch at this time as it may be a risk factor for stroke.  Hypothyroidism Continue Synthroid  Depression Continue Zoloft  History of panic disorder Patient has a history of panic disorder continue clonazepam 0.5 minute him at bedtime and alprazolam 0.5 3 times a day when necessary for anxiety.    Code status: DO NOT RESUSCITATE  Family discussion: Admission, patients condition and plan of care including tests being ordered have been discussed with the patient and her daughter at bedside* who indicate understanding and agree with the plan  and Code Status.   Time Spent on Admission: 60 min  Amira Podolak S [facility] Pager: [phone] 07/08/2014, 12:28 PM  If 7PM-7AM, please contact night-coverage  [email or web address]  Password [email or web address]

## 2014-07-08 NOTE — Progress Notes (Signed)
VASCULAR LAB PRELIMINARY  PRELIMINARY  PRELIMINARY  PRELIMINARY  Carotid duplex completed.    Preliminary report:  Bilateral:  1-39% ICA stenosis.  Vertebral artery flow is antegrade.     Sarah [name], RVS 07/08/2014, 4:41 PM

## 2014-07-08 NOTE — Progress Notes (Addendum)
Pt arrived to 4N13 at 1303.  Pt A&O x 4, no c/o pain.   Pt V/S taken, pt will be Q2 vitals and neuro exams until 0100.   Fluids running at 75 cc/hr. Pt without distress.  Family at the bedside. Diet ordered, will monitor.

## 2014-07-08 NOTE — Code Documentation (Signed)
81yo female arriving to [facility] via [facility] at 1000.  EMS reports that the patient woke up this morning at 0745.  She was at her baseline, but went to fix her hair and felt "foggy" and fell.  She tried to get up and fell again, she denies hitting her head.  EMS called and assessed left facial droop, left arm weakness and slurred speech.  Patient also reported difficulty getting her words out.  Code stroke activated.  Stroke team at the bedside on arrival.  Labs drawn and patient cleared by Dr. Jeraldine Loots.  Patient to CT.  Dr. Leroy Kennedy to the bedside.  NIHSS 3, see documentation for details and code stroke times.  Patient with mild left facial droop and slurred speech.  Patient reports decreased sensation in the left arm.  Patient with h/o TIA, was taking ASA 325mg  until 2 weeks ago when she started taking ASA 81mg .  Patient resides at [facility].  Daughter to the bedside.  Patient's symptoms are non-disabling at this time, but patient remains in the tPA window until 1215 should symptoms worsen.  Plan of care discussed with patient's daughter.  Bedside handoff with ED RN Dreama.

## 2014-07-09 ENCOUNTER — Inpatient Hospital Stay (HOSPITAL_COMMUNITY): Payer: Medicare Other

## 2014-07-09 ENCOUNTER — Encounter (HOSPITAL_COMMUNITY): Payer: Self-pay | Admitting: Radiology

## 2014-07-09 DIAGNOSIS — I10 Essential (primary) hypertension: Secondary | ICD-10-CM

## 2014-07-09 DIAGNOSIS — I5032 Chronic diastolic (congestive) heart failure: Secondary | ICD-10-CM

## 2014-07-09 DIAGNOSIS — I6350 Cerebral infarction due to unspecified occlusion or stenosis of unspecified cerebral artery: Secondary | ICD-10-CM

## 2014-07-09 DIAGNOSIS — J479 Bronchiectasis, uncomplicated: Secondary | ICD-10-CM

## 2014-07-09 DIAGNOSIS — I639 Cerebral infarction, unspecified: Secondary | ICD-10-CM

## 2014-07-09 LAB — CBC
HCT: 32.9 % — ABNORMAL LOW (ref 36.0–46.0)
Hemoglobin: 10 g/dL — ABNORMAL LOW (ref 12.0–15.0)
MCH: 25.2 pg — AB (ref 26.0–34.0)
MCHC: 30.4 g/dL (ref 30.0–36.0)
MCV: 82.9 fL (ref 78.0–100.0)
Platelets: 257 10*3/uL (ref 150–400)
RBC: 3.97 MIL/uL (ref 3.87–5.11)
RDW: 14.3 % (ref 11.5–15.5)
WBC: 7.4 10*3/uL (ref 4.0–10.5)

## 2014-07-09 LAB — COMPREHENSIVE METABOLIC PANEL
ALK PHOS: 93 U/L (ref 38–126)
ALT: 11 U/L — ABNORMAL LOW (ref 14–54)
AST: 23 U/L (ref 15–41)
Albumin: 3.3 g/dL — ABNORMAL LOW (ref 3.5–5.0)
Anion gap: 7 (ref 5–15)
BUN: 8 mg/dL (ref 6–20)
CO2: 26 mmol/L (ref 22–32)
Calcium: 8.3 mg/dL — ABNORMAL LOW (ref 8.9–10.3)
Chloride: 104 mmol/L (ref 101–111)
Creatinine, Ser: 0.73 mg/dL (ref 0.44–1.00)
GFR calc non Af Amer: >60 mL/min (ref >60)
Glucose, Bld: 90 mg/dL (ref 65–99)
Potassium: 3.9 mmol/L (ref 3.5–5.1)
SODIUM: 137 mmol/L (ref 135–145)
TOTAL PROTEIN: 6.3 g/dL — AB (ref 6.5–8.1)
Total Bilirubin: 0.3 mg/dL (ref 0.3–1.2)

## 2014-07-09 LAB — LIPID PANEL
CHOL/HDL RATIO: 2.7 ratio
Cholesterol: 179 mg/dL (ref 0–200)
HDL: 67 mg/dL (ref >40)
LDL CALC: 99 mg/dL (ref 0–99)
Triglycerides: 66 mg/dL (ref <150)
VLDL: 13 mg/dL (ref 0–40)

## 2014-07-09 MED ORDER — IOHEXOL 300 MG/ML  SOLN: 75.0000 mL | Freq: Once | INTRAMUSCULAR | Status: AC | PRN

## 2014-07-09 MED ORDER — ATORVASTATIN CALCIUM 10 MG PO TABS
10.0000 mg | ORAL_TABLET | Freq: Every day | ORAL | Status: DC
  Administered 2014-07-09: 10 mg via ORAL
  Filled 2014-07-09: qty 1

## 2014-07-09 MED ORDER — ASPIRIN 325 MG PO TABS
325.0000 mg | ORAL_TABLET | Freq: Every day | ORAL | Status: DC
  Administered 2014-07-10: 325 mg via ORAL
  Filled 2014-07-09: qty 1

## 2014-07-09 MED ORDER — ACETAMINOPHEN 325 MG PO TABS
650.0000 mg | ORAL_TABLET | Freq: Once | ORAL | Status: AC
  Administered 2014-07-09: 650 mg via ORAL
  Filled 2014-07-09: qty 2

## 2014-07-09 MED ORDER — ACETAMINOPHEN 325 MG PO TABS
650.0000 mg | ORAL_TABLET | Freq: Four times a day (QID) | ORAL | Status: DC | PRN
  Administered 2014-07-09 – 2014-07-10 (×3): 650 mg via ORAL
  Filled 2014-07-09 (×3): qty 2

## 2014-07-09 MED ORDER — CLOPIDOGREL BISULFATE 75 MG PO TABS: 75.0000 mg | ORAL_TABLET | Freq: Every day | ORAL | Status: DC

## 2014-07-09 MED ORDER — DULOXETINE HCL 20 MG PO CPEP
20.0000 mg | ORAL_CAPSULE | Freq: Three times a day (TID) | ORAL | Status: DC
  Administered 2014-07-09 – 2014-07-10 (×3): 20 mg via ORAL
  Filled 2014-07-09 (×6): qty 1

## 2014-07-09 MED ORDER — ESCITALOPRAM OXALATE 5 MG PO TABS
2.5000 mg | ORAL_TABLET | Freq: Every day | ORAL | Status: DC
  Administered 2014-07-09: 2.5 mg via ORAL
  Filled 2014-07-09 (×3): qty 1

## 2014-07-09 NOTE — Evaluation (Signed)
Physical Therapy Evaluation Patient Details Name: Sarah [name] MRN: [id number] DOB: 01-04-1922 Today's Date: 07/09/2014   History of Present Illness  Patient admitted on 6/24 due to episode of weakness while she was brushing her hair. Patient says that she was unable to speak for a while and became very anxious, she fell on the ground but did not lose consciousness.  Patient has a history of TIA in the past, code stroke was called and was seen by neurology. Due to minimal symptoms no TPA was administered. CT head did not show stroke. Pt now admitted for further evalutation for TIA.    Clinical Impression  Patient presents with problems listed below.  Will benefit from acute PT to maximize functional independence prior to discharge to ALF.  Recommend f/u HHPT for continued PT for balance.  Recommend patient use walker at discharge - will assess RW vs Rollator at next session.    Follow Up Recommendations Home health PT;Supervision - Intermittent    Equipment Recommendations  Rolling walker with 5" wheels (RW or rollator - TBD)    Recommendations for Other Services       Precautions / Restrictions Precautions Precautions: Fall Restrictions Weight Bearing Restrictions: No      Mobility  Bed Mobility               General bed mobility comments: Patient sitting in chair as PT entered room  Transfers Overall transfer level: Needs assistance Equipment used: Quad cane Transfers: Sit to/from Stand Sit to Stand: Min guard         General transfer comment: Assist for safety/balance  Ambulation/Gait Ambulation/Gait assistance: Min assist Ambulation Distance (Feet): 82 Feet Assistive device: Quad cane Gait Pattern/deviations: Step-through pattern;Decreased stride length;Shuffle;Staggering left;Staggering right Gait velocity: Decreased Gait velocity interpretation: Below normal speed for age/gender General Gait Details: Patient with unsteady gait, particularly when turning.   Quad cane provides minimal support.  Patient required physical assist x2 to maintain balance during gait.  Stairs            Wheelchair Mobility    Modified Rankin (Stroke Patients Only) Modified Rankin (Stroke Patients Only) Pre-Morbid Rankin Score: Moderate disability Modified Rankin: Moderately severe disability     Balance Overall balance assessment: Needs assistance         Standing balance support: Single extremity supported;During functional activity Standing balance-Leahy Scale: Fair Standing balance comment: Loss of balance noted during dynamic activities/gait                             Pertinent Vitals/Pain Pain Assessment: No/denies pain    Home Living Family/patient expects to be discharged to:: Assisted living ([facility]               Home Equipment: Gilmer Mor - quad;Shower seat - built in      Prior Function Level of Independence: Independent with assistive device(s)         Comments: Facility provides meals, medication management, housekeeping, laundry.  Patient has assistance on Tuesdays/Thursdays for driving (errands, etc.).     Hand Dominance   Dominant Hand: Right    Extremity/Trunk Assessment   Upper Extremity Assessment: Defer to OT evaluation           Lower Extremity Assessment: Generalized weakness (Decreased coordination)         Communication   Communication: Expressive difficulties (dysarthria)  Cognition Arousal/Alertness: Awake/alert Behavior During Therapy: WFL for tasks assessed/performed Overall Cognitive Status: Within Functional  Limits for tasks assessed                      General Comments      Exercises        Assessment/Plan    PT Assessment Patient needs continued PT services  PT Diagnosis Difficulty walking;Abnormality of gait;Generalized weakness   PT Problem List Decreased strength;Decreased balance;Decreased mobility;Decreased coordination;Decreased knowledge of  use of DME  PT Treatment Interventions DME instruction;Gait training;Functional mobility training;Therapeutic activities;Balance training;Patient/family education   PT Goals (Current goals can be found in the Care Plan section) Acute Rehab PT Goals Patient Stated Goal: to return home PT Goal Formulation: With patient/family Time For Goal Achievement: 07/16/14 Potential to Achieve Goals: Good    Frequency Min 4X/week   Barriers to discharge        Co-evaluation               End of Session Equipment Utilized During Treatment: Gait belt Activity Tolerance: Patient tolerated treatment well Patient left: in chair;with call bell/phone within reach;with family/visitor present Nurse Communication: Mobility status         Time: 2956-2130 PT Time Calculation (min) (ACUTE ONLY): 40 min   Charges:   PT Evaluation $Initial PT Evaluation Tier I: 1 Procedure PT Treatments $Gait Training: 8-22 mins $Therapeutic Activity: 8-22 mins   PT G Codes:        Vena Austria 07/11/2014, 10:29 PM Durenda Hurt. Renaldo Fiddler, [facility] Acute Rehab Services Pager [phone]

## 2014-07-09 NOTE — Progress Notes (Signed)
Occupational Therapy Evaluation Patient Details Name: Sarah [name] MRN: [id number] DOB: [birth year] Today's Date: 07/09/2014    History of Present Illness Patient admitted on 6/24 due to episode of weakness while she was brushing her hair. Patient says that she was unable to speak for a while and became very anxious, she fell on the ground but did not lose consciousness.  Patient has a history of TIA in the past, code stroke was called and was seen by neurology. Due to minimal symptoms no TPA was administered. CT head did not show stroke. Pt now admitted for further evalutation for TIA.  She is from assisted living where she was functioning at mod I level.  Pt presents now with balance deficits. Also c/o slightly blurred vision.  OT recommending she return to assisted living with 24/7 supervision/assist initially while she recovers.  Will continue to follow acutely in order to address problem list (listed below) in order to improve function and independence with ADLs prior to return home.   Clinical Impression    Pt presents now with balance deficits. Also c/o slightly blurred vision.  OT recommending she return to assisted living with 24/7 supervision/assist initially while she recovers.  Will continue to follow acutely in order to address problem list (listed below) in order to improve function and independence with ADLs prior to return home.    Follow Up Recommendations  No OT follow up;Supervision/Assistance - 24 hour    Equipment Recommendations  None recommended by OT    Recommendations for Other Services  none     Precautions / Restrictions Precautions Precautions: Fall      Mobility Bed Mobility Overal bed mobility: Independent                Transfers Overall transfer level: Needs assistance Equipment used: Quad cane Transfers: Sit to/from Stand Sit to Stand: Min guard;Supervision         General transfer comment: Min guard for safety on initial sit<>stand,  but supervision from chair after she had been ambulating in hallway.    Balance Overall balance assessment: Needs assistance         Standing balance support: No upper extremity supported Standing balance-Leahy Scale: Good Standing balance comment: Occasional posterior sway in standing but no significant LOB.                            ADL Overall ADL's : Needs assistance/impaired     Grooming: Brushing hair;Set up;Sitting   Upper Body Bathing: Set up;Sitting   Lower Body Bathing: Min guard;Sit to/from stand   Upper Body Dressing : Set up;Sitting   Lower Body Dressing: Min guard;Sit to/from stand   Toilet Transfer: Min guard;Ambulation;Comfort height toilet   Toileting- Clothing Manipulation and Hygiene: Min guard;Sit to/from stand       Functional mobility during ADLs: Min guard;Cane General ADL Comments: Patient able to visually locate ADL items in room although does state that she feels that her vision is generally more blurry than usual.  Pt able to don socks while sitting EOB and able to stand to perform toileting peri care with min guard.  OT noted slight posterior sway to right side while standing in hallway with min guard assist to correct balance. Patient states that she likes to don her pants/undergarments in standing and to stand in shower rather than use shower seat.  OT recommended sit<>stand method for LB dressing/bathing and to use shower seat upon inital return  home until she feels that she has returned to baseline.  Patient verbalized understanding of using sit<>stand technique for ADL tasks in order to help minimize fall risk at home.     Vision Vision Assessment?: No apparent visual deficits Additional Comments: Although patient does report some blurriness, she was able to complete all functional tasks.  Tracking appeared [facility].   Perception     Praxis      Pertinent Vitals/Pain Pain Assessment: No/denies pain     Hand Dominance      Extremity/Trunk Assessment Upper Extremity Assessment Upper Extremity Assessment: Overall WFL for tasks assessed   Lower Extremity Assessment Lower Extremity Assessment: Defer to PT evaluation       Communication Communication Communication: Expressive difficulties (dysarthria)   Cognition Arousal/Alertness: Awake/alert Behavior During Therapy: WFL for tasks assessed/performed Overall Cognitive Status: Within Functional Limits for tasks assessed                     General Comments       Exercises       Shoulder Instructions      Home Living Family/patient expects to be discharged to:: Assisted living                             Home Equipment: Cane - quad;Shower seat - built in          Prior Functioning/Environment Level of Independence: Independent with assistive device(s)             OT Diagnosis: Generalized weakness;Disturbance of vision   OT Problem List: Decreased strength;Decreased activity tolerance;Impaired balance (sitting and/or standing);Impaired vision/perception   OT Treatment/Interventions: Self-care/ADL training;DME and/or AE instruction;Energy conservation;Visual/perceptual remediation/compensation;Balance training;Patient/family education    OT Goals(Current goals can be found in the care plan section) Acute Rehab OT Goals Patient Stated Goal: to return home OT Goal Formulation: With patient Time For Goal Achievement: 07/16/14 Potential to Achieve Goals: Good  OT Frequency: Min 1X/week   Barriers to D/C:            Co-evaluation              End of Session Equipment Utilized During Treatment: Gait belt (cane) Nurse Communication: Mobility status  Activity Tolerance: Patient tolerated treatment well Patient left: in bed;with bed alarm set;with call bell/phone within reach   Time: 0810-0845 OT Time Calculation (min): 35 min Charges:  OT General Charges $OT Visit: 1 Procedure OT Evaluation $Initial OT  Evaluation Tier I: 1 Procedure OT Treatments $Self Care/Home Management : 8-22 mins G-Codes: OT G-codes **NOT FOR INPATIENT CLASS** Functional Assessment Tool Used: clinical judgment Functional Limitation: Self care Self Care Current Status (Q0165): At least 1 percent but less than 20 percent impaired, limited or restricted Self Care Goal Status [phone]): 0 percent impaired, limited or restricted   07/09/2014 Cipriano Mile OTR/L  Office [phone]

## 2014-07-09 NOTE — Progress Notes (Addendum)
PROGRESS NOTE  Sarah [name] WUJ:[id number] DOB: November 24, 1921 DOA: 07/08/2014 PCP: Emeterio Reeve, MD  Brief history 81 year old female with a history of bronchiectasis, hypertension, peripheral neuropathy, NSTEMI,  TIA, MAI presented with acute onset of left- arm weakness while she was brushing her hair. When EMS arrived, the patient was noted to have dysarthria and expressive aphasia as well as left facial droop. Code stroke was activated and the patient was brought to the emergency department. The patient denies any fevers, chills, chest pain, shortness breath, hemoptysis , nausea, vomiting, diarrhea, abdominal pain. She resides at [facility]. Assessment/Plan: acute left cerebellar stroke - CT brain negative for acute findings - MRI brain-- acute left cerebellar stroke - MRA brain-- no major intracranial stenosis - Echocardiogram EF 65-70%, grade 2 diastolic dysfunction, no thrombi - Carotid duplex-- negative for  hemodynamically significant stenosis - hemoglobin A1c-- pending - LDL--99-->start lipitor - continue aspirin 325 mg daily for neurology recommendations - Patient was on aspirin 81 mg daily prior to admission  bronchiectasis with abnormal chest x-ray -  Chest x-ray shows LLL infiltrate , question right upper lobe nodule/mass - Patient does not clinically have pneumonia - Will not start antibiotics - the abnormalities are likely related to pt's MAI which appears to be clinically quiescent - patient is quite undecided whether she will pursue any diagnostic or therapeutic modalities even if she had an abnormal CT chest -  In this setting, I will hold off ordering  CT chest unless her family feels compelled for continued aggressive therapy  Hypertension -  Controlled off of medications  Chronic diastolic CHF - euvolemic at this time  Depression/panic disorder - continue Zoloft - continue Klonopin and Xanax  hypothyroidism - continue Synthroid   Family  Communication:    Son-in-law updated at beside--total time 35 min, >50% spent counseling and coordinating care Disposition Plan:   Home when medically stable       Procedures/Studies: Dg Chest 2 View  07/08/2014   CLINICAL DATA:  Cough and shortness of breath. History of hypertension. Nonsmoker.  EXAM: CHEST  2 VIEW  COMPARISON:  10/15/2013  FINDINGS: The heart is enlarged. There is infiltrate in the left lower lobe associated with air bronchograms. Perihilar bronchitic changes are present. Within the right upper lobe there is a suspicious spiculated density partially obscured by the EKG lead. Mass measures at least 9 mm. Further evaluation with CT of the chest is warranted. Trace bilateral pleural effusions noted.  IMPRESSION: 1. Cardiomegaly without edema. 2. Left lower lobe infiltrate, likely infectious. 3. Suspicious right upper lobe mass. Further evaluation with chest CT is recommended. Contrast administration is recommended unless contraindicated. 4. These results will be called to the ordering clinician or representative by the Radiologist Assistant, and communication documented in the PACS or zVision Dashboard.   Electronically Signed   By: Norva Pavlov M.D.   On: 07/08/2014 18:34   Ct Head Wo Contrast  07/08/2014   CLINICAL DATA:  Code stroke. LEFT-sided weakness which began earlier today. Speech difficulty persists.  EXAM: CT HEAD WITHOUT CONTRAST  TECHNIQUE: Contiguous axial images were obtained from the base of the skull through the vertex without intravenous contrast.  COMPARISON:  MRI 01/15/2014.  CT head 01/15/2014.  FINDINGS: No evidence for acute infarction, hemorrhage, mass lesion, hydrocephalus, or extra-axial fluid. Advanced atrophy not unexpected for age. Chronic microvascular ischemic change in the periventricular and subcortical white matter.  Calvarium intact. No sinus or mastoid disease. Vascular  calcification proximally.  Bulging of the RIGHT cavernous sinus reflects the  previously documented ICA aneurysm on the RIGHT. Correlate clinically for a source of emboli to the RIGHT hemisphere.  IMPRESSION: No acute intracranial findings.  Chronic changes as described.  Critical Value/emergent results were called by telephone at the time of interpretation on 07/08/2014 at 10:25 Am to Dr. Wyatt Portela , who verbally acknowledged these results.   Electronically Signed   By: Elsie Stain M.D.   On: 07/08/2014 10:32   Mr Brain Wo Contrast  07/08/2014   CLINICAL DATA:  Episode of weakness and aphasia this morning.  EXAM: MRI HEAD WITHOUT CONTRAST  MRA HEAD WITHOUT CONTRAST  TECHNIQUE: Multiplanar, multiecho pulse sequences of the brain and surrounding structures were obtained without intravenous contrast. Angiographic images of the head were obtained using MRA technique without contrast.  COMPARISON:  Head CT 07/08/2014, MRI 08/26/2014, and MRA 10/03/2011  FINDINGS: MRI HEAD FINDINGS  A partially empty sella is incidentally noted. There is an approximately 2 cm acute infarct in the left cerebellum in the SCA territory. There is no evidence of intracranial hemorrhage, mass, midline shift, or extra-axial fluid collection. Cerebral atrophy is noted. Patchy T2 hyperintensities in the subcortical and deep cerebral white matter and pons are nonspecific but compatible with mild chronic small vessel ischemic disease, similar to the prior MRI.  Prior bilateral cataract extraction is noted. There is a large left mastoid effusion. Left sigmoid sinus hyperintensity on spin echo T2 sequences is chronic and unchanged from the prior study, possibly reflecting slow flow with no filling defect on that study's postcontrast images to indicate thrombus. A prominent arachnoid granulation is noted in the lateral aspect of the left transverse sinus. Right transverse and sigmoid sinuses are dominant.  MRA HEAD FINDINGS  The visualized distal vertebral arteries are patent and codominant without stenosis. PICA  origins are patent. SCA origins are patent. Basilar artery is patent without stenosis. PCAs are patent with mild branch vessel irregularity but no proximal stenosis. Posterior communicating arteries are not clearly identified.  Internal carotid arteries are patent from skullbase to carotid termini without stenosis. There are medially directed aneurysms arising from the horizontal portions of both cavernous carotids. The aneurysm on the right measures approximately 10 x 9 mm (previously 8 x 7 mm on prior MRA). Left cavernous carotid aneurysm measures 4 mm (previously 2-3 mm). The right A1 segment is mildly hypoplastic. Anterior communicating artery is patent. M1 segments are patent without stenosis. Major MCA branch vessels are patent, with branch vessel irregularity and attenuation greater on the right.  IMPRESSION: 1. 2 cm acute left cerebellar SCA territory infarct. 2. Mild chronic small vessel ischemic disease. 3. Large left mastoid effusion. 4. No major intracranial arterial occlusion or significant proximal stenosis. 5. Bilateral cavernous carotid aneurysms, slightly increased in size since 2013 MRA.   Electronically Signed   By: Sebastian Ache   On: 07/08/2014 19:24   Mr Maxine Glenn Head/brain Wo Cm  07/08/2014   CLINICAL DATA:  Episode of weakness and aphasia this morning.  EXAM: MRI HEAD WITHOUT CONTRAST  MRA HEAD WITHOUT CONTRAST  TECHNIQUE: Multiplanar, multiecho pulse sequences of the brain and surrounding structures were obtained without intravenous contrast. Angiographic images of the head were obtained using MRA technique without contrast.  COMPARISON:  Head CT 07/08/2014, MRI 08/26/2014, and MRA 10/03/2011  FINDINGS: MRI HEAD FINDINGS  A partially empty sella is incidentally noted. There is an approximately 2 cm acute infarct in the left cerebellum in the  SCA territory. There is no evidence of intracranial hemorrhage, mass, midline shift, or extra-axial fluid collection. Cerebral atrophy is noted. Patchy T2  hyperintensities in the subcortical and deep cerebral white matter and pons are nonspecific but compatible with mild chronic small vessel ischemic disease, similar to the prior MRI.  Prior bilateral cataract extraction is noted. There is a large left mastoid effusion. Left sigmoid sinus hyperintensity on spin echo T2 sequences is chronic and unchanged from the prior study, possibly reflecting slow flow with no filling defect on that study's postcontrast images to indicate thrombus. A prominent arachnoid granulation is noted in the lateral aspect of the left transverse sinus. Right transverse and sigmoid sinuses are dominant.  MRA HEAD FINDINGS  The visualized distal vertebral arteries are patent and codominant without stenosis. PICA origins are patent. SCA origins are patent. Basilar artery is patent without stenosis. PCAs are patent with mild branch vessel irregularity but no proximal stenosis. Posterior communicating arteries are not clearly identified.  Internal carotid arteries are patent from skullbase to carotid termini without stenosis. There are medially directed aneurysms arising from the horizontal portions of both cavernous carotids. The aneurysm on the right measures approximately 10 x 9 mm (previously 8 x 7 mm on prior MRA). Left cavernous carotid aneurysm measures 4 mm (previously 2-3 mm). The right A1 segment is mildly hypoplastic. Anterior communicating artery is patent. M1 segments are patent without stenosis. Major MCA branch vessels are patent, with branch vessel irregularity and attenuation greater on the right.  IMPRESSION: 1. 2 cm acute left cerebellar SCA territory infarct. 2. Mild chronic small vessel ischemic disease. 3. Large left mastoid effusion. 4. No major intracranial arterial occlusion or significant proximal stenosis. 5. Bilateral cavernous carotid aneurysms, slightly increased in size since 2013 MRA.   Electronically Signed   By: Sebastian Ache   On: 07/08/2014 19:24          Subjective: Patient denies fevers, chills, headache, chest pain, dyspnea, nausea, vomiting, diarrhea, abdominal pain, dysuria, hematuria   Objective: Filed Vitals:   07/09/14 0029 07/09/14 0157 07/09/14 0558 07/09/14 1102  BP: 135/78 115/60 137/71 118/66  Pulse: 76 70 70 79  Temp: 97.7 F (36.5 C) 97.5 F (36.4 C) 97.5 F (36.4 C)   TempSrc: Axillary Axillary Axillary Axillary  Resp: Height:      Weight:      SpO2: 95% 96% 96% 97%   No intake or output data in the 24 hours ending 07/09/14 1425 Weight change:  Exam:   General:  Pt is alert, follows commands appropriately, not in acute distress  HEENT: No icterus, No thrush, No neck mass, [place]/AT  Cardiovascular: RRR, S1/S2, no rubs, no gallops  Respiratory:  Bilateral scattered rales without any wheezing. Good air movement.  Abdomen: Soft/+BS, non tender, non distended, no guarding;  No hepatosplenomegaly  Extremities: No edema, No lymphangitis, No petechiae, No rashes, no synovitis;  No cyanosis or clubbing  Data Reviewed: Basic Metabolic Panel:  Recent Labs Lab 07/08/14 1005 07/08/14 1009 07/09/14 0551  NA 138 137 137  K 4.1 4.1 3.9  CL 104 105 104  CO2 24  --  26  GLUCOSE 100* 100* 90  BUN CREATININE 0.80 0.90 0.73  CALCIUM 8.6*  --  8.3*   Liver Function Tests:  Recent Labs Lab 07/08/14 1005 07/09/14 0551  AST 24 23  ALT 10* 11*  ALKPHOS 98 93  BILITOT 0.5 0.3  PROT 7.0 6.3*  ALBUMIN 3.7 3.3*   No results for input(s): LIPASE, AMYLASE in the last 168 hours. No results for input(s): AMMONIA in the last 168 hours. CBC:  Recent Labs Lab 07/08/14 1005 07/08/14 1009 07/09/14 0551  WBC 6.4  --  7.4  NEUTROABS 3.7  --   --   HGB 10.4* 12.6 10.0*  HCT 34.2* 37.0 32.9*  MCV 82.0  --  82.9  PLT 244  --  257   Cardiac Enzymes: No results for input(s): CKTOTAL, CKMB, CKMBINDEX, TROPONINI in the last 168 hours. BNP: Invalid input(s): POCBNP CBG: No  results for input(s): GLUCAP in the last 168 hours.  No results found for this or any previous visit (from the past 240 hour(s)).   Scheduled Meds: .  stroke: mapping our early stages of recovery book   Does not apply Once  . aspirin  325 mg Oral Daily  . clonazePAM  0.5 mg Oral QHS  . enoxaparin (LOVENOX) injection  40 mg Subcutaneous Q24H  . levothyroxine  50 mcg Oral Daily  . sertraline  25 mg Oral Daily   Continuous Infusions: . sodium chloride 50 mL/hr (07/09/14 0359)     Pepe Mineau, DO  [facility] Pager [phone]  If 7PM-7AM, please contact night-coverage [email or web address] Password [email or web address] 07/09/2014, 2:25 PM   LOS: 1 day

## 2014-07-09 NOTE — Progress Notes (Signed)
STROKE TEAM PROGRESS NOTE   HISTORY Sarah [name] is an 81 y.o. female with history of multiple TIA's in the past. She awoke this AM and noted at 0745 she had a sudden onset of left arm weakness while brushing her hair. This weakness persisted and she called EMS. EMS arrived and noted a left facial droop, left arm weakness and dysarthria/expressive aphasia. Code stroke was called and patient was brought to ED. On arrival she had a left facial droop and dysarthria. tPA was not given do to minimal symptoms.  Denies HA, vertigo, double vision, difficulty swallowing, or vision impairment.  Date last known well: Date: 07/08/2014 Time last known well: Time: 07:45 tPA Given: No: minimal symptoms     SUBJECTIVE (INTERVAL HISTORY) Daughter is at bedside. Patient is very anxious, and has panic attacks.  Patient's symptoms are improving. Tried to reassure patient and daughter.   OBJECTIVE Temp:  [97.5 F (36.4 C)-97.8 F (36.6 C)] 97.5 F (36.4 C) (06/25 0558) Pulse Rate:  [56-76] 70 (06/25 0558) Cardiac Rhythm:  [-] Normal sinus rhythm (06/24 2030) Resp:  [8-23] 18 (06/25 0558) BP: (115-186)/(60-125) 137/71 mmHg (06/25 0558) SpO2:  [95 %-100 %] 96 % (06/25 0558) Weight:  [56.972 kg (125 lb 9.6 oz)] 56.972 kg (125 lb 9.6 oz) (06/24 1023)  No results for input(s): GLUCAP in the last 168 hours.  Recent Labs Lab 07/08/14 1005 07/08/14 1009 07/09/14 0551  NA 138 137 137  K 4.1 4.1 3.9  CL 104 105 104  CO2 24  --  26  GLUCOSE 100* 100* 90  BUN 11 13 8   CREATININE 0.80 0.90 0.73  CALCIUM 8.6*  --  8.3*    Recent Labs Lab 07/08/14 1005 07/09/14 0551  AST 24 23  ALT 10* 11*  ALKPHOS 98 93  BILITOT 0.5 0.3  PROT 7.0 6.3*  ALBUMIN 3.7 3.3*    Recent Labs Lab 07/08/14 1005 07/08/14 1009 07/09/14 0551  WBC 6.4  --  7.4  NEUTROABS 3.7  --   --   HGB 10.4* 12.6 10.0*  HCT 34.2* 37.0 32.9*  MCV 82.0  --  82.9  PLT 244  --  257   No results for input(s): CKTOTAL,  CKMB, CKMBINDEX, TROPONINI in the last 168 hours.  Recent Labs  07/08/14 1005  LABPROT 14.4  INR 1.10    Recent Labs  07/08/14 1042  COLORURINE YELLOW  LABSPEC 1.006  PHURINE 7.5  GLUCOSEU NEGATIVE  HGBUR NEGATIVE  BILIRUBINUR NEGATIVE  KETONESUR NEGATIVE  PROTEINUR NEGATIVE  UROBILINOGEN 0.2  NITRITE NEGATIVE  LEUKOCYTESUR TRACE*       Component Value Date/Time   CHOL 179 07/09/2014 0551   TRIG 66 07/09/2014 0551   HDL 67 07/09/2014 0551   CHOLHDL 2.7 07/09/2014 0551   VLDL 13 07/09/2014 0551   LDLCALC 99 07/09/2014 0551   Lab Results  Component Value Date   HGBA1C 6.1* 09/06/2010      Component Value Date/Time   LABOPIA NONE DETECTED 07/08/2014 1042   COCAINSCRNUR NONE DETECTED 07/08/2014 1042   LABBENZ POSITIVE* 07/08/2014 1042   AMPHETMU NONE DETECTED 07/08/2014 1042   THCU NONE DETECTED 07/08/2014 1042   LABBARB NONE DETECTED 07/08/2014 1042     Recent Labs Lab 07/08/14 1005  ETH <5    Dg Chest 2 View 07/08/2014    1. Cardiomegaly without edema.  2. Left lower lobe infiltrate, likely infectious.  3. Suspicious right upper lobe mass. Further evaluation with chest CT is recommended. Contrast administration  is recommended unless contraindicated.    Ct Head Wo Contrast 07/08/2014    No acute intracranial findings.  Chronic changes as described.      MRI / MRA Brain Wo Contrast 07/08/2014    1. 2 cm acute left cerebellar SCA territory infarct.  2. Mild chronic small vessel ischemic disease.  3. Large left mastoid effusion.  4. No major intracranial arterial occlusion or significant proximal stenosis.  5. Bilateral cavernous carotid aneurysms, slightly increased in size since 2013 MRA.     The aneurysm on the right measures approximately 10 x 9 mm (previously 8  x 7 mm on prior MRA). Left cavernous carotid aneurysm measures 4 mm  (previously 2-3 mm).      PHYSICAL EXAM  Physical exam: Exam: Gen: NAD, conversant, anxious                      CV: RRR, no MRG. No Carotid Bruits. No peripheral edema, warm, nontender Eyes: Conjunctivae clear without exudates or hemorrhage  Speech:   Dysarthric with with normal comprehension.  No aphasia. Cognition:    The patient is oriented to person, place, and time;     Cranial Nerves:    The pupils are equal, round, and reactive to light. The fundi are flat. Visual fields are full to finger confrontation. Extraocular movements are intact. The face is asymmetric. The palate elevates in the midline. Hearing intact to voice. Voice is normal. Shoulder shrug is normal. The tongue has normal motion without fasciculations.   Coordination:   No dysmetria   Motor Observation:    No asymmetry, no atrophy, and no involuntary movements noted. Tone:    Normal muscle tone.      Strength:    Strength is V/V in the upper and lower limbs.      Sensation: intact to LT     Reflex Exam:  DTR's:    Deep tendon reflexes in the upper and lower extremities are symmetrical bilaterally.   Toes:    The toes are downgoing bilaterally.   Clonus:    Clonus is absent.       ASSESSMENT/PLAN [name] is a 81 y.o. female with history of  multiple TIAs, diastolic dysfunction, hyperlipidemia, coronary artery disease with previous MI, migraine headaches, remote seizure disorder, and bilateral cavernous carotid aneurysms presenting with left facial droop, left arm weakness, and dysarthria/aphasia.  She did not receive IV t-PA due to improvement in deficits.  Stroke:  Non-dominant 2 cm acute left cerebellar SCA territory infarct.  Likely small vessel disease.  Resultant  dysarthria  MRI  2 cm acute left cerebellar SCA territory infarct.   MRA  Bilateral cavernous carotid aneurysms, slightly increased in size since 2013 MRA.  Can follow up with Dr. Pearlean Brownie or Dr. Roda Shutters in 2 months  Carotid Doppler 1-39% ICA stenosis. Vertebral artery flow is antegrade.   2D Echo EF 65-70%. No cardiac source of  emboli identified  LDL 99  HgbA1c pending  Lovenox for VTE prophylaxis  Diet Heart Room service appropriate?: Yes; Fluid consistency:: Thin  aspirin 81 mg orally every day prior to admission, now on aspirin 325 mg orally every day.   Patient counseled to be compliant with her antithrombotic medications  Ongoing aggressive stroke risk factor management  Therapy recommendations: No follow-up OT. PT evaluation pending. 24-hour per day supervision/assistance.  Disposition:  Pending  Hypertension  Home meds:  Lopressor 12.5 mg twice daily  Stable  Patient counseled to be  compliant with her blood pressure medications  Hyperlipidemia  Home meds:  No lipid lowering medications prior to admission.  LDL 99, goal < 70  Add low-dose statin - Lipitor 10 mg daily  Continue statin at discharge    Other Stroke Risk Factors  Advanced age  ETOH use  Hx stroke/TIA  Family hx stroke (Mother)  Coronary artery disease  Migraines  Other Active Problems  Abnormal chest x-ray - CT scan recommended - will order  Anemia  Bilateral cavernous carotid artery aneurysms - enlarged from 2013  Other Pertinent History    Hospital day # 1  This is a lovely patient with prior TIAs on aspirin,  diastolic dysfunction, hyperlipidemia, coronary artery disease with previous MI, migraine headaches, remote seizure disorder, and bilateral cavernous carotid aneurysms presenting with left facial droop, left arm weakness, and dysarthria/aphasia.  with a cerebellar infarct likely due to small vessel disease. She has resultant dysarthria.   Personally examined patient and images, and have participated in and made any corrections needed to history, physical, neuro exam,assessment and plan as stated above.  Naomie Dean, MD Stroke Neurology [phone] [facility]        To contact Stroke Continuity provider, please refer to [email or web address]. After hours, contact General  Neurology

## 2014-07-09 NOTE — Progress Notes (Signed)
PT Cancellation Note  Patient Details Name: Evann Aldana MRN: [id number] DOB: January 04, 1922   Cancelled Treatment:    Reason Eval/Treat Not Completed: Patient at procedure or test/unavailable (Pt in MRI.  Will return as able. Thanks.)   Tawni Millers F 07/09/2014, 2:59 PM  [facility] [phone] (pager)

## 2014-07-10 MED ORDER — ATORVASTATIN CALCIUM 10 MG PO TABS: 10.0000 mg | ORAL_TABLET | Freq: Every day | ORAL | Status: DC

## 2014-07-10 MED ORDER — CLOPIDOGREL BISULFATE 75 MG PO TABS: 75.0000 mg | ORAL_TABLET | Freq: Every day | ORAL | Status: DC

## 2014-07-10 MED ORDER — CLOPIDOGREL BISULFATE 75 MG PO TABS
75.0000 mg | ORAL_TABLET | Freq: Every day | ORAL | Status: DC
Start: 2014-07-10 — End: 2017-12-09

## 2014-07-10 MED ORDER — CLOPIDOGREL BISULFATE 75 MG PO TABS
75.0000 mg | ORAL_TABLET | Freq: Every day | ORAL | Status: DC
  Administered 2014-07-10: 75 mg via ORAL
  Filled 2014-07-10: qty 1

## 2014-07-10 MED ORDER — ASPIRIN 325 MG PO TABS
325.0000 mg | ORAL_TABLET | Freq: Every day | ORAL | Status: DC
Start: 2014-07-10 — End: 2014-07-10

## 2014-07-10 NOTE — Progress Notes (Signed)
Discharge orders received.  Discharge instructions and follow-up appointments reviewed with the patient and her daughter.  VSS upon discharge.  IV removed and education complete.  Rolling walker delivered to the patient. Transported out via wheelchair. Sondra Come, RN

## 2014-07-10 NOTE — Progress Notes (Signed)
Physical Therapy Treatment Patient Details Name: Sarah [name] MRN: [id number] DOB: 1921-03-21 Today's Date: 07/10/2014    History of Present Illness Patient admitted on 6/24 due to episode of weakness while she was brushing her hair. Patient says that she was unable to speak for a while and became very anxious, she fell on the ground but did not lose consciousness.  Patient has a history of TIA in the past, code stroke was called and was seen by neurology. Due to minimal symptoms no TPA was administered. CT head did not show stroke. Pt now admitted for further evalutation for TIA.      PT Comments    Patient making good gains with mobility and gait.  Patient used 4-wheeled RW (rollator) for gait, improving balance and safety.  Recommend rollator for use at home.  Will also need f/u HHPT at ALF. Sarah [name], CM regarding these needs).  Patient ready for d/c from PT perspective.   Follow Up Recommendations  Home health PT;Supervision - Intermittent     Equipment Recommendations  Other (comment) (Rollator (4-wheeled RW))    Recommendations for Other Services       Precautions / Restrictions Precautions Precautions: Fall Restrictions Weight Bearing Restrictions: No    Mobility  Bed Mobility Overal bed mobility: Independent                Transfers Overall transfer level: Needs assistance Equipment used: Rolling walker (2 wheeled);4-wheeled walker Transfers: Sit to/from Stand Sit to Stand: Supervision         General transfer comment: Supervision for safety.  Verbal cues for use of RW and rollator.  Ambulation/Gait Ambulation/Gait assistance: Supervision Ambulation Distance (Feet): 160 Feet (34' x2 (with RW and rollator)) Assistive device: Rolling walker (2 wheeled);4-wheeled walker Gait Pattern/deviations: Step-through pattern;Decreased stride length;Trunk flexed Gait velocity: Decreased Gait velocity interpretation: Below normal speed for  age/gender General Gait Details: Verbal cues for use of RW and Rollator during gait.  Patient able to ambulate 75' with each assistive device with supervision.  Patient able to make turns more safely with use of rollator.   Stairs            Wheelchair Mobility    Modified Rankin (Stroke Patients Only) Modified Rankin (Stroke Patients Only) Pre-Morbid Rankin Score: Moderate disability Modified Rankin: Moderately severe disability     Balance               Standing balance comment: No loss of balance during gait with RW/rollator                    Cognition Arousal/Alertness: Awake/alert Behavior During Therapy: WFL for tasks assessed/performed Overall Cognitive Status: Within Functional Limits for tasks assessed                      Exercises      General Comments        Pertinent Vitals/Pain Pain Assessment: 0-10 Pain Score: 3  Pain Location: Head Pain Descriptors / Indicators: Headache Pain Intervention(s): Premedicated before session    Home Living                      Prior Function            PT Goals (current goals can now be found in the care plan section) Progress towards PT goals: Progressing toward goals    Frequency  Min 4X/week    PT Plan Current plan remains appropriate;Equipment recommendations need  to be updated    Co-evaluation             End of Session Equipment Utilized During Treatment: Gait belt Activity Tolerance: Patient tolerated treatment well Patient left: in bed;with call bell/phone within reach;with bed alarm set     Time: 0910-0923 PT Time Calculation (min) (ACUTE ONLY): 13 min  Charges:  $Gait Training: 8-22 mins                    G Codes:      Sarah [name] 21-Jul-2014, 9:31 AM Sarah [name]. Renaldo Fiddler, [facility] Pager [phone]

## 2014-07-10 NOTE — Discharge Summary (Addendum)
Physician Discharge Summary  Jackquelyn Cotten WUG:[id number] DOB: 1921-06-18 DOA: 07/08/2014  PCP: Emeterio Reeve, MD  Admit date: 07/08/2014 Discharge date: 07/10/2014  Recommendations for Outpatient Follow-up:  1. Pt will need to follow up with PCP in 2 weeks post discharge 2. Please obtain BMP and CBC in 1-2 weeks 3.  Please follow up on hemoglobin A1c results Discharge Diagnoses:  acute left cerebellar stroke - CT brain negative for acute findings - MRI brain-- acute left cerebellar stroke - MRA brain-- no major intracranial stenosis - Echocardiogram EF 65-70%, grade 2 diastolic dysfunction, no thrombi - Carotid duplex-- negative for hemodynamically significant stenosis - hemoglobin A1c-- pending - LDL--99-->start lipitor 10 mg daily  - Plavix 75mg  daily per neurology recommendations--discussed with Dr. Lucia Gaskins - Patient was on aspirin 81 mg daily prior to admission-->discontinue bronchiectasis with abnormal chest x-ray - Chest x-ray shows LLL infiltrate , question right upper lobe nodule/mass - Patient does not clinically have pneumonia - Will not start antibiotics - the abnormalities are likely related to pt's MAI which appears to be clinically quiescent - patient is quite undecided whether she will pursue any diagnostic or therapeutic modalities even if she had an abnormal CT chest - I had a long discussion with the patient and her daughter at the bedside regarding her abnormal chest x-ray. They do not feel compelled for aggressive treatment at this time. The patient prefers to follow-up with her pulmonologist in the outpatient setting, Dr. Delton Coombes, to determine the need for further radiographic and diagnostic studies.  -Such, I will defer ordering CT of the chest at this time.  Hypertension - Controlled off of medications Chronic diastolic CHF - euvolemic at this time; echocardiogram shows EF of 65-70%, grade 2 diastolic dysfunction, no wall motion  abnormalities Depression/panic disorder -  Pt stated she is no longer taking Zoloft - continue Klonopin and Xanax - Restarted Lexapro and Cymbalta  hypothyroidism - continue Synthroid  Discharge Condition: stable  Disposition: home Follow-up Information    Follow up with SETHI,PRAMOD, MD In 2 months.   Specialties:  Neurology, Radiology   Contact information:   [address] [place] [address] [phone]       Follow up with [facility].   Why:  rollator (rolling walker with seat)   Contact information:   [address] [place] [address] [phone]     home  Diet:heart healthy Wt Readings from Last 3 Encounters:  07/08/14 56.972 kg (125 lb 9.6 oz)  12/17/13 54.613 kg (120 lb 6.4 oz)  10/22/13 55.339 kg (122 lb)    History of present illness:  81 year old female with a history of bronchiectasis, hypertension, peripheral neuropathy, NSTEMI, TIA, MAI presented with acute onset of left- arm weakness while she was brushing her hair. When EMS arrived, the patient was noted to have dysarthria and expressive aphasia as well as left facial droop. Code stroke was activated and the patient was brought to the emergency department. The patient denies any fevers, chills, chest pain, shortness breath, hemoptysis , nausea, vomiting, diarrhea, abdominal pain. She resides at [facility].  workup including MRI revealed a left cerebellar stroke. Neurology was consulted. The patient's risk factors were optimized. Physical therapy was consulted and recommended home health physical therapy. The patient had abnormal chest x-ray. She and her family prefer to follow-up with her pulmonologist, Dr. Delton Coombes. The patient was afebrile and hemodynamically stable without any respiratory distress. The patient was restarted on her home medications for her  panic disorder.    Consultants: Neurology  Discharge Exam: Filed Vitals:   07/10/14 0903  BP: 144/70   Pulse: 98  Temp: 97.7 F (36.5 C)  Resp: 18   Filed Vitals:   07/10/14 0106 07/10/14 0346 07/10/14 0534 07/10/14 0903  BP: 160/83 146/84 143/72 144/70  Pulse: 96  88 98  Temp: 97.8 F (36.6 C)  97.8 F (36.6 C) 97.7 F (36.5 C)  TempSrc: Oral  Oral Oral  Resp: 18  18 18   Height:      Weight:      SpO2: 96%  93% 95%   General: A&O x 3, NAD, pleasant, cooperative Cardiovascular: RRR, no rub, no gallop, no S3 Respiratory: Bibasilar crackles. No wheeze. Abdomen:soft, nontender, nondistended, positive bowel sounds Extremities: No edema, No lymphangitis, no petechiae  Discharge Instructions      Discharge Instructions    Diet - low sodium heart healthy    Complete by:  As directed      Increase activity slowly    Complete by:  As directed             Medication List    STOP taking these medications        aspirin EC 81 MG tablet     aspirin-acetaminophen-caffeine 250-250-65 MG per tablet  Commonly known as:  EXCEDRIN MIGRAINE     sertraline 50 MG tablet  Commonly known as:  ZOLOFT      TAKE these medications        ALPRAZolam 0.5 MG tablet  Commonly known as:  XANAX  Take 0.5 mg by mouth 2 (two) times daily. CAN TAKE AN ADDT'L TABLET AS NEEDED FOR ANXIETY OR PANIC ATTACK     atorvastatin 10 MG tablet  Commonly known as:  LIPITOR  Take 1 tablet (10 mg total) by mouth daily at 6 PM.     calcium carbonate 500 MG chewable tablet  Commonly known as:  TUMS - dosed in mg elemental calcium  Chew 1 tablet by mouth as needed for indigestion or heartburn.     cetirizine 10 MG tablet  Commonly known as:  ZYRTEC  Take 10 mg by mouth as needed for allergies.     clonazePAM 0.5 MG tablet  Commonly known as:  KLONOPIN  Take 0.25 mg by mouth at bedtime.     clopidogrel 75 MG tablet  Commonly known as:  PLAVIX  Take 1 tablet (75 mg total) by mouth daily.     DULoxetine 20 MG capsule  Commonly known as:  CYMBALTA  Take 20 mg by mouth 3 (three) times daily.      escitalopram 5 MG tablet  Commonly known as:  LEXAPRO  Take 2.5 mg by mouth daily.     levalbuterol 45 MCG/ACT inhaler  Commonly known as:  XOPENEX HFA  Inhale 1-2 puffs into the lungs every 6 (six) hours as needed for wheezing.     levothyroxine 50 MCG tablet  Commonly known as:  SYNTHROID, LEVOTHROID  Take 50 mcg by mouth daily.     metoprolol tartrate 25 MG tablet  Commonly known as:  LOPRESSOR  Take 0.5 tablets (12.5 mg total) by mouth 2 (two) times daily.     MINIVELLE 0.05 MG/24HR patch  Generic drug:  estradiol  PLACE 1 PATCH ONTO SKIN ONCE A WEEK(THURSDAY)     nitroGLYCERIN 0.4 MG SL tablet  Commonly known as:  NITROSTAT  Place 0.4 mg under the tongue every 5 (five) minutes as needed for chest  pain.         The results of significant diagnostics from this hospitalization (including imaging, microbiology, ancillary and laboratory) are listed below for reference.    Significant Diagnostic Studies: Dg Chest 2 View  07/08/2014   CLINICAL DATA:  Cough and shortness of breath. History of hypertension. Nonsmoker.  EXAM: CHEST  2 VIEW  COMPARISON:  10/15/2013  FINDINGS: The heart is enlarged. There is infiltrate in the left lower lobe associated with air bronchograms. Perihilar bronchitic changes are present. Within the right upper lobe there is a suspicious spiculated density partially obscured by the EKG lead. Mass measures at least 9 mm. Further evaluation with CT of the chest is warranted. Trace bilateral pleural effusions noted.  IMPRESSION: 1. Cardiomegaly without edema. 2. Left lower lobe infiltrate, likely infectious. 3. Suspicious right upper lobe mass. Further evaluation with chest CT is recommended. Contrast administration is recommended unless contraindicated. 4. These results will be called to the ordering clinician or representative by the Radiologist Assistant, and communication documented in the PACS or zVision Dashboard.   Electronically Signed   By: Norva Pavlov M.D.   On: 07/08/2014 18:34   Ct Head Wo Contrast  07/08/2014   CLINICAL DATA:  Code stroke. LEFT-sided weakness which began earlier today. Speech difficulty persists.  EXAM: CT HEAD WITHOUT CONTRAST  TECHNIQUE: Contiguous axial images were obtained from the base of the skull through the vertex without intravenous contrast.  COMPARISON:  MRI 01/15/2014.  CT head 01/15/2014.  FINDINGS: No evidence for acute infarction, hemorrhage, mass lesion, hydrocephalus, or extra-axial fluid. Advanced atrophy not unexpected for age. Chronic microvascular ischemic change in the periventricular and subcortical white matter.  Calvarium intact. No sinus or mastoid disease. Vascular calcification proximally.  Bulging of the RIGHT cavernous sinus reflects the previously documented ICA aneurysm on the RIGHT. Correlate clinically for a source of emboli to the RIGHT hemisphere.  IMPRESSION: No acute intracranial findings.  Chronic changes as described.  Critical Value/emergent results were called by telephone at the time of interpretation on 07/08/2014 at 10:25 Am to Dr. Wyatt Portela , who verbally acknowledged these results.   Electronically Signed   By: Elsie Stain M.D.   On: 07/08/2014 10:32   Mr Brain Wo Contrast  07/08/2014   CLINICAL DATA:  Episode of weakness and aphasia this morning.  EXAM: MRI HEAD WITHOUT CONTRAST  MRA HEAD WITHOUT CONTRAST  TECHNIQUE: Multiplanar, multiecho pulse sequences of the brain and surrounding structures were obtained without intravenous contrast. Angiographic images of the head were obtained using MRA technique without contrast.  COMPARISON:  Head CT 07/08/2014, MRI 08/26/2014, and MRA 10/03/2011  FINDINGS: MRI HEAD FINDINGS  A partially empty sella is incidentally noted. There is an approximately 2 cm acute infarct in the left cerebellum in the SCA territory. There is no evidence of intracranial hemorrhage, mass, midline shift, or extra-axial fluid collection. Cerebral atrophy is noted.  Patchy T2 hyperintensities in the subcortical and deep cerebral white matter and pons are nonspecific but compatible with mild chronic small vessel ischemic disease, similar to the prior MRI.  Prior bilateral cataract extraction is noted. There is a large left mastoid effusion. Left sigmoid sinus hyperintensity on spin echo T2 sequences is chronic and unchanged from the prior study, possibly reflecting slow flow with no filling defect on that study's postcontrast images to indicate thrombus. A prominent arachnoid granulation is noted in the lateral aspect of the left transverse sinus. Right transverse and sigmoid sinuses are dominant.  MRA HEAD  FINDINGS  The visualized distal vertebral arteries are patent and codominant without stenosis. PICA origins are patent. SCA origins are patent. Basilar artery is patent without stenosis. PCAs are patent with mild branch vessel irregularity but no proximal stenosis. Posterior communicating arteries are not clearly identified.  Internal carotid arteries are patent from skullbase to carotid termini without stenosis. There are medially directed aneurysms arising from the horizontal portions of both cavernous carotids. The aneurysm on the right measures approximately 10 x 9 mm (previously 8 x 7 mm on prior MRA). Left cavernous carotid aneurysm measures 4 mm (previously 2-3 mm). The right A1 segment is mildly hypoplastic. Anterior communicating artery is patent. M1 segments are patent without stenosis. Major MCA branch vessels are patent, with branch vessel irregularity and attenuation greater on the right.  IMPRESSION: 1. 2 cm acute left cerebellar SCA territory infarct. 2. Mild chronic small vessel ischemic disease. 3. Large left mastoid effusion. 4. No major intracranial arterial occlusion or significant proximal stenosis. 5. Bilateral cavernous carotid aneurysms, slightly increased in size since 2013 MRA.   Electronically Signed   By: Sebastian Ache   On: 07/08/2014 19:24   Mr  Maxine Glenn Head/brain Wo Cm  07/08/2014   CLINICAL DATA:  Episode of weakness and aphasia this morning.  EXAM: MRI HEAD WITHOUT CONTRAST  MRA HEAD WITHOUT CONTRAST  TECHNIQUE: Multiplanar, multiecho pulse sequences of the brain and surrounding structures were obtained without intravenous contrast. Angiographic images of the head were obtained using MRA technique without contrast.  COMPARISON:  Head CT 07/08/2014, MRI 08/26/2014, and MRA 10/03/2011  FINDINGS: MRI HEAD FINDINGS  A partially empty sella is incidentally noted. There is an approximately 2 cm acute infarct in the left cerebellum in the SCA territory. There is no evidence of intracranial hemorrhage, mass, midline shift, or extra-axial fluid collection. Cerebral atrophy is noted. Patchy T2 hyperintensities in the subcortical and deep cerebral white matter and pons are nonspecific but compatible with mild chronic small vessel ischemic disease, similar to the prior MRI.  Prior bilateral cataract extraction is noted. There is a large left mastoid effusion. Left sigmoid sinus hyperintensity on spin echo T2 sequences is chronic and unchanged from the prior study, possibly reflecting slow flow with no filling defect on that study's postcontrast images to indicate thrombus. A prominent arachnoid granulation is noted in the lateral aspect of the left transverse sinus. Right transverse and sigmoid sinuses are dominant.  MRA HEAD FINDINGS  The visualized distal vertebral arteries are patent and codominant without stenosis. PICA origins are patent. SCA origins are patent. Basilar artery is patent without stenosis. PCAs are patent with mild branch vessel irregularity but no proximal stenosis. Posterior communicating arteries are not clearly identified.  Internal carotid arteries are patent from skullbase to carotid termini without stenosis. There are medially directed aneurysms arising from the horizontal portions of both cavernous carotids. The aneurysm on the right  measures approximately 10 x 9 mm (previously 8 x 7 mm on prior MRA). Left cavernous carotid aneurysm measures 4 mm (previously 2-3 mm). The right A1 segment is mildly hypoplastic. Anterior communicating artery is patent. M1 segments are patent without stenosis. Major MCA branch vessels are patent, with branch vessel irregularity and attenuation greater on the right.  IMPRESSION: 1. 2 cm acute left cerebellar SCA territory infarct. 2. Mild chronic small vessel ischemic disease. 3. Large left mastoid effusion. 4. No major intracranial arterial occlusion or significant proximal stenosis. 5. Bilateral cavernous carotid aneurysms, slightly increased in size since 2013 MRA.  Electronically Signed   By: Sebastian Ache   On: 07/08/2014 19:24     Microbiology: No results found for this or any previous visit (from the past 240 hour(s)).   Labs: Basic Metabolic Panel:  Recent Labs Lab 07/08/14 1005 07/08/14 1009 07/09/14 0551  NA 138 137 137  K 4.1 4.1 3.9  CL 104 105 104  CO2 24  --  26  GLUCOSE 100* 100* 90  BUN CREATININE 0.80 0.90 0.73  CALCIUM 8.6*  --  8.3*   Liver Function Tests:  Recent Labs Lab 07/08/14 1005 07/09/14 0551  AST 24 23  ALT 10* 11*  ALKPHOS 98 93  BILITOT 0.5 0.3  PROT 7.0 6.3*  ALBUMIN 3.7 3.3*   No results for input(s): LIPASE, AMYLASE in the last 168 hours. No results for input(s): AMMONIA in the last 168 hours. CBC:  Recent Labs Lab 07/08/14 1005 07/08/14 1009 07/09/14 0551  WBC 6.4  --  7.4  NEUTROABS 3.7  --   --   HGB 10.4* 12.6 10.0*  HCT 34.2* 37.0 32.9*  MCV 82.0  --  82.9  PLT 244  --  257   Cardiac Enzymes: No results for input(s): CKTOTAL, CKMB, CKMBINDEX, TROPONINI in the last 168 hours. BNP: Invalid input(s): POCBNP CBG: No results for input(s): GLUCAP in the last 168 hours.  Time coordinating discharge:  Greater than 30 minutes  Signed:  Matas Burrows, DO [facility] Pager: [phone] 07/10/2014, 10:11 AM

## 2014-07-10 NOTE — Progress Notes (Signed)
STROKE TEAM PROGRESS NOTE   HISTORY Sarah [name] is an 81 y.o. female with history of multiple TIA's in the past. She awoke this AM and noted at 0745 she had a sudden onset of left arm weakness while brushing her hair. This weakness persisted and she called EMS. EMS arrived and noted a left facial droop, left arm weakness and dysarthria/expressive aphasia. Code stroke was called and patient was brought to ED. On arrival she had a left facial droop and dysarthria. tPA was not given do to minimal symptoms.  Denies HA, vertigo, double vision, difficulty swallowing, or vision impairment.  Date last known well: Date: 07/08/2014 Time last known well: Time: 07:45 tPA Given: No: minimal symptoms     SUBJECTIVE (INTERVAL HISTORY) Daughter is at bedside. Patient is still very anxious, and has panic attacks.  Patient's symptoms are improving. Tried to reassure patient and daughter. Explained we will be changing to Plavix for stroke prevention since she has had several TIAs and now an ischemic infarct while taking ASA. Increased risk of bleeding, they acknowlegde risks.   OBJECTIVE Temp:  [97.7 F (36.5 C)-98 F (36.7 C)] 97.7 F (36.5 C) (06/26 0903) Pulse Rate:  [71-98] 98 (06/26 0903) Cardiac Rhythm:  [-] Normal sinus rhythm (06/25 1900) Resp:  [18] 18 (06/26 0903) BP: (116-160)/(69-84) 144/70 mmHg (06/26 0903) SpO2:  [93 %-100 %] 95 % (06/26 0903)  No results for input(s): GLUCAP in the last 168 hours.  Recent Labs Lab 07/08/14 1005 07/08/14 1009 07/09/14 0551  NA 138 137 137  K 4.1 4.1 3.9  CL 104 105 104  CO2 24  --  26  GLUCOSE 100* 100* 90  BUN 11 13 8   CREATININE 0.80 0.90 0.73  CALCIUM 8.6*  --  8.3*    Recent Labs Lab 07/08/14 1005 07/09/14 0551  AST 24 23  ALT 10* 11*  ALKPHOS 98 93  BILITOT 0.5 0.3  PROT 7.0 6.3*  ALBUMIN 3.7 3.3*    Recent Labs Lab 07/08/14 1005 07/08/14 1009 07/09/14 0551  WBC 6.4  --  7.4  NEUTROABS 3.7  --   --   HGB 10.4*  12.6 10.0*  HCT 34.2* 37.0 32.9*  MCV 82.0  --  82.9  PLT 244  --  257   No results for input(s): CKTOTAL, CKMB, CKMBINDEX, TROPONINI in the last 168 hours.  Recent Labs  07/08/14 1005  LABPROT 14.4  INR 1.10    Recent Labs  07/08/14 1042  COLORURINE YELLOW  LABSPEC 1.006  PHURINE 7.5  GLUCOSEU NEGATIVE  HGBUR NEGATIVE  BILIRUBINUR NEGATIVE  KETONESUR NEGATIVE  PROTEINUR NEGATIVE  UROBILINOGEN 0.2  NITRITE NEGATIVE  LEUKOCYTESUR TRACE*       Component Value Date/Time   CHOL 179 07/09/2014 0551   TRIG 66 07/09/2014 0551   HDL 67 07/09/2014 0551   CHOLHDL 2.7 07/09/2014 0551   VLDL 13 07/09/2014 0551   LDLCALC 99 07/09/2014 0551   Lab Results  Component Value Date   HGBA1C 6.1* 09/06/2010      Component Value Date/Time   LABOPIA NONE DETECTED 07/08/2014 1042   COCAINSCRNUR NONE DETECTED 07/08/2014 1042   LABBENZ POSITIVE* 07/08/2014 1042   AMPHETMU NONE DETECTED 07/08/2014 1042   THCU NONE DETECTED 07/08/2014 1042   LABBARB NONE DETECTED 07/08/2014 1042     Recent Labs Lab 07/08/14 1005  ETH <5    Dg Chest 2 View 07/08/2014    1. Cardiomegaly without edema.  2. Left lower lobe infiltrate, likely infectious.  3. Suspicious right upper lobe mass. Further evaluation with chest CT is recommended. Contrast administration is recommended unless contraindicated.   Chest CT cancelled per Dr Tat - Please refer to his thorough note and evaluation for rationale.     Ct Head Wo Contrast 07/08/2014    No acute intracranial findings.  Chronic changes as described.      MRI / MRA Brain Wo Contrast 07/08/2014    1. 2 cm acute left cerebellar SCA territory infarct.  2. Mild chronic small vessel ischemic disease.  3. Large left mastoid effusion.  4. No major intracranial arterial occlusion or significant proximal stenosis.  5. Bilateral cavernous carotid aneurysms, slightly increased in size since 2013 MRA.     The aneurysm on the right measures approximately  10 x 9 mm (previously 8  x 7 mm on prior MRA). Left cavernous carotid aneurysm measures 4 mm  (previously 2-3 mm).      PHYSICAL EXAM  Physical exam: Exam: Gen: NAD, conversant, anxious                     CV: RRR, no MRG. No Carotid Bruits. No peripheral edema, warm, nontender Eyes: Conjunctivae clear without exudates or hemorrhage  Speech:   Dysarthric with with normal comprehension.  No aphasia. Improved today. Cognition:    The patient is oriented to person, place, and time;     Cranial Nerves:    The pupils are equal, round, and reactive to light. The fundi are flat. Visual fields are full to finger confrontation. Extraocular movements are intact. The face is asymmetric. The palate elevates in the midline. Hearing intact to voice. Voice is normal. Shoulder shrug is normal. The tongue has normal motion without fasciculations.   Coordination:   No dysmetria   Motor Observation:    No asymmetry, no atrophy, and no involuntary movements noted. Tone:    Normal muscle tone.      Strength:    Strength is V/V in the upper and lower limbs.      Sensation: intact to LT     Reflex Exam:  DTR's:    Deep tendon reflexes in the upper and lower extremities are symmetrical bilaterally.   Toes:    The toes are downgoing bilaterally.   Clonus:    Clonus is absent.       ASSESSMENT/PLAN [name] is a 81 y.o. female with history of  multiple TIAs, diastolic dysfunction, hyperlipidemia, coronary artery disease with previous MI, migraine headaches, remote seizure disorder, and bilateral cavernous carotid aneurysms presenting with left facial droop, left arm weakness, and dysarthria/aphasia.  She did not receive IV t-PA due to improvement in deficits.  Stroke:  Non-dominant 2 cm acute left cerebellar SCA territory infarct.  Likely small vessel disease.  Resultant  Improving dysarthria  MRI  2 cm acute left cerebellar SCA territory infarct.   MRA  Bilateral  cavernous carotid aneurysms, slightly increased in size since 2013 MRA.  Can follow up with Dr. Pearlean Brownie or Dr. Roda Shutters in 2 months  Carotid Doppler 1-39% ICA stenosis. Vertebral artery flow is antegrade.   2D Echo EF 65-70%. No cardiac source of emboli identified  LDL 99  HgbA1c pending  Lovenox for VTE prophylaxis Diet Heart Room service appropriate?: Yes; Fluid consistency:: Thin Diet - low sodium heart healthy  aspirin 81 mg orally every day prior to admission, now on aspirin 325 mg orally every day. Discharge on Plavix  daily due to multiple TIAs and  now an ischemic infarct while on ASA.   Patient counseled to be compliant with her antithrombotic medications  Ongoing aggressive stroke risk factor management  Therapy recommendations: No follow-up OT.  PT recommends home health PT.   24-hour per day supervision/assistance.  Disposition:  Discharged to home today.  Hypertension  Home meds:  Lopressor 12.5 mg twice daily  Stable  Patient counseled to be compliant with her blood pressure medications  Hyperlipidemia  Home meds:  No lipid lowering medications prior to admission.  LDL 99, goal < 70  Add low-dose statin - Lipitor 10 mg daily  Continue statin at discharge    Other Stroke Risk Factors  Advanced age  ETOH use  Hx stroke/TIA  Family hx stroke (Mother)  Coronary artery disease  Migraines  Other Active Problems  Abnormal chest x-ray - CT scan recommended - canceled per Dr. Arbutus Leas - please see his thorough note for rationale.  Anemia  Bilateral cavernous carotid artery aneurysms - enlarged from 2013. Probably not a candidate for intervention based on age. Could monitor further on an outpatient basis.   PLAN  Discharge today  Home health physical therapy  Follow-up with Dr. Lucia Gaskins in 2 months  Delton See PA-C [facility] Pager [phone] 07/10/2014, 1:39 PM    Hospital day # 2  This is a lovely patient with prior  TIAs on aspirin,  diastolic dysfunction, hyperlipidemia, coronary artery disease with previous MI, migraine headaches, remote seizure disorder, and bilateral cavernous carotid aneurysms presenting with left facial droop, left arm weakness, and dysarthria/aphasia.  with a cerebellar infarct likely due to small vessel disease. She has resultant improving dysarthria.   Personally examined patient and images, and have participated in and made any corrections needed to history, physical, neuro exam,assessment and plan as stated above.  Naomie Dean, MD Stroke Neurology [phone] [facility]        To contact Stroke Continuity provider, please refer to [email or web address]. After hours, contact General Neurology

## 2014-07-10 NOTE — Clinical Social Work Note (Signed)
Clinical Social Work Assessment  Patient Details  Name: Sarah [name] MRN: [id number] Date of Birth: 1921-05-22  Date of referral:  07/10/14               Reason for consult:  Facility Placement                Permission sought to share information with:  Case Manager, Customer service manager, Family Supports Permission granted to share information::  Yes, Verbal Permission Granted  Name::     [name] (daughter)  Agency::  [facility], ALF  Relationship::  yes permission completed and daughter at the bedside  Contact Information:     Housing/Transportation Living arrangements for the past 2 months:  [place] of Information:  Patient, Medical Team, Adult Children Patient Interpreter Needed:  None Criminal Activity/Legal Involvement Pertinent to Current Situation/Hospitalization:  No - Comment as needed Significant Relationships:  Adult Children, Other Family Members Lives with:  Facility Resident Do you feel safe going back to the place where you live?  Yes Need for family participation in patient care:  Yes (Comment) (support)  Care giving concerns:  None at this time.  Reports happy with care are facility   Social Worker assessment / plan:  LCSW  Received call from RN CM that patient is from ALF, admitted on Friday and returning today. LCSW met with patient and family, compelted assessment and all in agreement to return. Call placed to [facility] who is agreeable with plan to return today. FL2 updated, sent [place] orders for home health PT to begin at facility.  Patient alert and oriented an agreeable for return. Daughter at the bedside who is going to transport.   Employment status:  Retired Forensic scientist:  [employer] PT Recommendations:  24 Hour Supervision ([facility] [place]) [place] / Referral to community resources:  Other (Comment Required) (home health ordered for patient)  Patient/Family's Response to care:  Agreeable to  plan  Patient/Family's Understanding of and Emotional Response to Diagnosis, Current Treatment, and Prognosis:  LCSW sat and spoke with patient about her feelings towards having a stroke. Patient reports anxiety and sadness as "no one wants to have a stroke" and fear of having an even bigger stroke in the future. LCSW utilized brief therapy (CBT) in which allowed patient to process her negative thoughts leading to negative outcomes. Patient able to define positive strengths such as going home, regaining feeling in her left leg, and ability to have humor and hope that she is able to go home and proceed with life.  Patient able to process emotions regarding new Stroke dx.  Emotional Assessment Appearance:  Appears stated age Attitude/Demeanor/Rapport:  Other (reports sad due to Stroke) Affect (typically observed):  Accepting, Appropriate, Pleasant Orientation:  Oriented to Self, Oriented to Place, Oriented to  Time, Oriented to Situation Alcohol / Substance use:  Not Applicable Psych involvement (Current and /or in the community):  Yes (Comment) (just medications for depression/anxiety, no psych consult)  Discharge Needs  Concerns to be addressed:  No discharge needs identified Readmission within the last 30 days:  No Current discharge risk:  None Barriers to Discharge:  No Barriers Identified (DC back to ALF,)   [place], LCSW 07/10/2014, 10:46 AM

## 2014-07-10 NOTE — Care Management Note (Signed)
Case Management Note  Patient Details  Name: Cleota Donaway MRN: [id number] Date of Birth: 09/14/1921  Subjective/Objective:                   acute left cerebellar stroke Action/Plan: Discharge planning  Expected Discharge Date:  07/10/14               Expected Discharge Plan:  Assisted Living / Rest Home  In-House Referral:     Discharge planning Services  CM Consult  Post Acute Care Choice:    Choice offered to:     DME Arranged:  [place] rolling with seat DME Agency:     HH Arranged:    HH Agency:     Status of Service:  Completed, signed off  Medicare Important Message Given:    Date Medicare IM Given:    Medicare IM give by:    Date Additional Medicare IM Given:    Additional Medicare Important Message give by:     If discussed at Long Length of Stay Meetings, dates discussed:    Additional Comments: CM spoke with pt who states she lives at [facility] and they have their own PT staff.  Cm called [facility] [phone] and was told this is an ALF and they would need to speak with CSW.  CM called CSW, Dahlia Client and gave her number and contact, Jasmine December to arrange discharge and PT at [facility].  CM called [facility] DME rep, Fayrene Fearing to please deliver the rollator to room so pt can discharge.  No other CM needs were communicated.  Yves Dill, RN 07/10/2014, 9:53 AM

## 2014-07-11 DIAGNOSIS — F329 Major depressive disorder, single episode, unspecified: Secondary | ICD-10-CM | POA: Diagnosis not present

## 2014-07-11 DIAGNOSIS — I69354 Hemiplegia and hemiparesis following cerebral infarction affecting left non-dominant side: Secondary | ICD-10-CM | POA: Diagnosis not present

## 2014-07-11 DIAGNOSIS — R569 Unspecified convulsions: Secondary | ICD-10-CM | POA: Diagnosis not present

## 2014-07-11 DIAGNOSIS — I10 Essential (primary) hypertension: Secondary | ICD-10-CM | POA: Diagnosis not present

## 2014-07-11 DIAGNOSIS — I69322 Dysarthria following cerebral infarction: Secondary | ICD-10-CM | POA: Diagnosis not present

## 2014-07-11 LAB — HEMOGLOBIN A1C
Hgb A1c MFr Bld: 6 % — ABNORMAL HIGH (ref 4.8–5.6)
Mean Plasma Glucose: 126 mg/dL

## 2014-07-13 DIAGNOSIS — I69354 Hemiplegia and hemiparesis following cerebral infarction affecting left non-dominant side: Secondary | ICD-10-CM | POA: Diagnosis not present

## 2014-07-13 DIAGNOSIS — I10 Essential (primary) hypertension: Secondary | ICD-10-CM | POA: Diagnosis not present

## 2014-07-13 DIAGNOSIS — F329 Major depressive disorder, single episode, unspecified: Secondary | ICD-10-CM | POA: Diagnosis not present

## 2014-07-13 DIAGNOSIS — I69322 Dysarthria following cerebral infarction: Secondary | ICD-10-CM | POA: Diagnosis not present

## 2014-07-13 DIAGNOSIS — R569 Unspecified convulsions: Secondary | ICD-10-CM | POA: Diagnosis not present

## 2014-07-14 DIAGNOSIS — R918 Other nonspecific abnormal finding of lung field: Secondary | ICD-10-CM | POA: Diagnosis not present

## 2014-07-14 DIAGNOSIS — Z79899 Other long term (current) drug therapy: Secondary | ICD-10-CM | POA: Diagnosis not present

## 2014-07-14 DIAGNOSIS — I639 Cerebral infarction, unspecified: Secondary | ICD-10-CM | POA: Diagnosis not present

## 2014-07-15 DIAGNOSIS — I69354 Hemiplegia and hemiparesis following cerebral infarction affecting left non-dominant side: Secondary | ICD-10-CM | POA: Diagnosis not present

## 2014-07-15 DIAGNOSIS — I10 Essential (primary) hypertension: Secondary | ICD-10-CM | POA: Diagnosis not present

## 2014-07-15 DIAGNOSIS — F329 Major depressive disorder, single episode, unspecified: Secondary | ICD-10-CM | POA: Diagnosis not present

## 2014-07-15 DIAGNOSIS — I69322 Dysarthria following cerebral infarction: Secondary | ICD-10-CM | POA: Diagnosis not present

## 2014-07-15 DIAGNOSIS — R569 Unspecified convulsions: Secondary | ICD-10-CM | POA: Diagnosis not present

## 2014-07-20 DIAGNOSIS — I10 Essential (primary) hypertension: Secondary | ICD-10-CM | POA: Diagnosis not present

## 2014-07-20 DIAGNOSIS — I69354 Hemiplegia and hemiparesis following cerebral infarction affecting left non-dominant side: Secondary | ICD-10-CM | POA: Diagnosis not present

## 2014-07-20 DIAGNOSIS — I69322 Dysarthria following cerebral infarction: Secondary | ICD-10-CM | POA: Diagnosis not present

## 2014-07-20 DIAGNOSIS — R569 Unspecified convulsions: Secondary | ICD-10-CM | POA: Diagnosis not present

## 2014-07-20 DIAGNOSIS — F329 Major depressive disorder, single episode, unspecified: Secondary | ICD-10-CM | POA: Diagnosis not present

## 2014-07-21 ENCOUNTER — Telehealth: Payer: Self-pay | Admitting: *Deleted

## 2014-07-21 ENCOUNTER — Encounter: Payer: Self-pay | Admitting: *Deleted

## 2014-07-21 ENCOUNTER — Ambulatory Visit (INDEPENDENT_AMBULATORY_CARE_PROVIDER_SITE_OTHER): Payer: Medicare Other | Admitting: Gynecology

## 2014-07-21 ENCOUNTER — Encounter: Payer: Self-pay | Admitting: Gynecology

## 2014-07-21 VITALS — BP 116/74

## 2014-07-21 DIAGNOSIS — I69322 Dysarthria following cerebral infarction: Secondary | ICD-10-CM | POA: Diagnosis not present

## 2014-07-21 DIAGNOSIS — I10 Essential (primary) hypertension: Secondary | ICD-10-CM | POA: Diagnosis not present

## 2014-07-21 DIAGNOSIS — I639 Cerebral infarction, unspecified: Secondary | ICD-10-CM | POA: Diagnosis not present

## 2014-07-21 DIAGNOSIS — R569 Unspecified convulsions: Secondary | ICD-10-CM | POA: Diagnosis not present

## 2014-07-21 DIAGNOSIS — Z79890 Hormone replacement therapy (postmenopausal): Secondary | ICD-10-CM

## 2014-07-21 DIAGNOSIS — F329 Major depressive disorder, single episode, unspecified: Secondary | ICD-10-CM | POA: Diagnosis not present

## 2014-07-21 DIAGNOSIS — I69354 Hemiplegia and hemiparesis following cerebral infarction affecting left non-dominant side: Secondary | ICD-10-CM | POA: Diagnosis not present

## 2014-07-21 MED ORDER — MINIVELLE 0.025 MG/24HR TD PTTW: 1.0000 | MEDICATED_PATCH | TRANSDERMAL | Status: DC

## 2014-07-21 NOTE — Telephone Encounter (Signed)
LETTER FAXED TO [facility] AT [phone]

## 2014-07-21 NOTE — Patient Instructions (Signed)
Follow up if you have any issues after stopping the estrogen.

## 2014-07-21 NOTE — Progress Notes (Signed)
Jama FlavorsGertrude Kinley 1921/04/11 [id number]        81 y.o.  G2P2 Presents with her daughter to discuss her ERT. Recently had a cerebellar stroke. Has been on estradiol patch 0.5. Notes that she has been on ERT for 40 years. Tried stopping it and had unacceptable hot flashes and sweats.  Has no residual weakness from her stroke.  Past medical history,surgical history, problem list, medications, allergies, family history and social history were all reviewed and documented in the EPIC chart.  Directed ROS with pertinent positives and negatives documented in the history of present illness/assessment and plan.  Exam: Kim assistant Filed Vitals:   07/21/14 1022  BP: 116/74   General appearance:  Normal  Assessment/Plan:  81 y.o. G2P2 with history of recent stroke on estradiol 0.05 patch.  I reviewed with the patient and her daughter the whole issue of ERT and risks as I have in the past to include increased risk of stroke heart attack DVT. I discussed the WHI study and more recent studies to include the [place] study that showed a higher incidence of MI and strokes in recently discontinued HRT patients. I discussed the transdermal absorption with probable lower incidence of thrombosis and the issue that she has been on this for 40 years and that I doubt that the ERT significantly contributed to the stroke but certainly it could have given the studies showing a higher incidence in estrogen users. After a lengthy discussion my recommendation would be to stop the ERT. I wrote a prescription for minivelle at her request instead of the generic patch she is using now and ordered the 0.25 mg dose. I've asked her to use this twice weekly for one month and then 1 per week for several weeks and then stop to allow for a more gradual decline in the estrogen and hopefully avoid symptoms. Patient and her daughter both understand and agree with the plan.    Dara LordsFONTAINE,Tarvares Lant P MD, 10:44 AM 07/21/2014

## 2014-07-21 NOTE — Telephone Encounter (Signed)
Pt daughter Johnny BridgeMartha called stating the pt retirement home is requesting written direction from today visit for minivelle patch. Directions are twice weekly x 1 month, the once weekly x 1 month then D/C patch. Pt is to start patch on Monday 07/25/14.

## 2014-07-22 DIAGNOSIS — I69322 Dysarthria following cerebral infarction: Secondary | ICD-10-CM | POA: Diagnosis not present

## 2014-07-22 DIAGNOSIS — I69354 Hemiplegia and hemiparesis following cerebral infarction affecting left non-dominant side: Secondary | ICD-10-CM | POA: Diagnosis not present

## 2014-07-22 DIAGNOSIS — R569 Unspecified convulsions: Secondary | ICD-10-CM | POA: Diagnosis not present

## 2014-07-22 DIAGNOSIS — F329 Major depressive disorder, single episode, unspecified: Secondary | ICD-10-CM | POA: Diagnosis not present

## 2014-07-22 DIAGNOSIS — I10 Essential (primary) hypertension: Secondary | ICD-10-CM | POA: Diagnosis not present

## 2014-07-25 DIAGNOSIS — R569 Unspecified convulsions: Secondary | ICD-10-CM | POA: Diagnosis not present

## 2014-07-25 DIAGNOSIS — F329 Major depressive disorder, single episode, unspecified: Secondary | ICD-10-CM | POA: Diagnosis not present

## 2014-07-25 DIAGNOSIS — I69354 Hemiplegia and hemiparesis following cerebral infarction affecting left non-dominant side: Secondary | ICD-10-CM | POA: Diagnosis not present

## 2014-07-25 DIAGNOSIS — I69322 Dysarthria following cerebral infarction: Secondary | ICD-10-CM | POA: Diagnosis not present

## 2014-07-25 DIAGNOSIS — I10 Essential (primary) hypertension: Secondary | ICD-10-CM | POA: Diagnosis not present

## 2014-07-27 DIAGNOSIS — I10 Essential (primary) hypertension: Secondary | ICD-10-CM | POA: Diagnosis not present

## 2014-07-27 DIAGNOSIS — I69354 Hemiplegia and hemiparesis following cerebral infarction affecting left non-dominant side: Secondary | ICD-10-CM | POA: Diagnosis not present

## 2014-07-27 DIAGNOSIS — R569 Unspecified convulsions: Secondary | ICD-10-CM | POA: Diagnosis not present

## 2014-07-27 DIAGNOSIS — F329 Major depressive disorder, single episode, unspecified: Secondary | ICD-10-CM | POA: Diagnosis not present

## 2014-07-27 DIAGNOSIS — I69322 Dysarthria following cerebral infarction: Secondary | ICD-10-CM | POA: Diagnosis not present

## 2014-07-29 DIAGNOSIS — I10 Essential (primary) hypertension: Secondary | ICD-10-CM | POA: Diagnosis not present

## 2014-07-29 DIAGNOSIS — R569 Unspecified convulsions: Secondary | ICD-10-CM | POA: Diagnosis not present

## 2014-07-29 DIAGNOSIS — I69354 Hemiplegia and hemiparesis following cerebral infarction affecting left non-dominant side: Secondary | ICD-10-CM | POA: Diagnosis not present

## 2014-07-29 DIAGNOSIS — F329 Major depressive disorder, single episode, unspecified: Secondary | ICD-10-CM | POA: Diagnosis not present

## 2014-07-29 DIAGNOSIS — I69322 Dysarthria following cerebral infarction: Secondary | ICD-10-CM | POA: Diagnosis not present

## 2014-08-01 DIAGNOSIS — I69322 Dysarthria following cerebral infarction: Secondary | ICD-10-CM | POA: Diagnosis not present

## 2014-08-01 DIAGNOSIS — H60392 Other infective otitis externa, left ear: Secondary | ICD-10-CM | POA: Diagnosis not present

## 2014-08-01 DIAGNOSIS — H6982 Other specified disorders of Eustachian tube, left ear: Secondary | ICD-10-CM | POA: Diagnosis not present

## 2014-08-01 DIAGNOSIS — R569 Unspecified convulsions: Secondary | ICD-10-CM | POA: Diagnosis not present

## 2014-08-01 DIAGNOSIS — F329 Major depressive disorder, single episode, unspecified: Secondary | ICD-10-CM | POA: Diagnosis not present

## 2014-08-01 DIAGNOSIS — I69354 Hemiplegia and hemiparesis following cerebral infarction affecting left non-dominant side: Secondary | ICD-10-CM | POA: Diagnosis not present

## 2014-08-01 DIAGNOSIS — I10 Essential (primary) hypertension: Secondary | ICD-10-CM | POA: Diagnosis not present

## 2014-08-01 DIAGNOSIS — H6502 Acute serous otitis media, left ear: Secondary | ICD-10-CM | POA: Diagnosis not present

## 2014-08-02 DIAGNOSIS — H4011X3 Primary open-angle glaucoma, severe stage: Secondary | ICD-10-CM | POA: Diagnosis not present

## 2014-08-02 DIAGNOSIS — H47649 Disorders of visual cortex in (due to) vascular disorders, unspecified side of brain: Secondary | ICD-10-CM | POA: Diagnosis not present

## 2014-08-02 DIAGNOSIS — Z961 Presence of intraocular lens: Secondary | ICD-10-CM | POA: Diagnosis not present

## 2014-08-03 DIAGNOSIS — F329 Major depressive disorder, single episode, unspecified: Secondary | ICD-10-CM | POA: Diagnosis not present

## 2014-08-03 DIAGNOSIS — I10 Essential (primary) hypertension: Secondary | ICD-10-CM | POA: Diagnosis not present

## 2014-08-03 DIAGNOSIS — I69322 Dysarthria following cerebral infarction: Secondary | ICD-10-CM | POA: Diagnosis not present

## 2014-08-03 DIAGNOSIS — I69354 Hemiplegia and hemiparesis following cerebral infarction affecting left non-dominant side: Secondary | ICD-10-CM | POA: Diagnosis not present

## 2014-08-03 DIAGNOSIS — R569 Unspecified convulsions: Secondary | ICD-10-CM | POA: Diagnosis not present

## 2014-08-04 DIAGNOSIS — F329 Major depressive disorder, single episode, unspecified: Secondary | ICD-10-CM | POA: Diagnosis not present

## 2014-08-04 DIAGNOSIS — R569 Unspecified convulsions: Secondary | ICD-10-CM | POA: Diagnosis not present

## 2014-08-04 DIAGNOSIS — I10 Essential (primary) hypertension: Secondary | ICD-10-CM | POA: Diagnosis not present

## 2014-08-04 DIAGNOSIS — I69354 Hemiplegia and hemiparesis following cerebral infarction affecting left non-dominant side: Secondary | ICD-10-CM | POA: Diagnosis not present

## 2014-08-04 DIAGNOSIS — I69322 Dysarthria following cerebral infarction: Secondary | ICD-10-CM | POA: Diagnosis not present

## 2014-08-10 DIAGNOSIS — I10 Essential (primary) hypertension: Secondary | ICD-10-CM | POA: Diagnosis not present

## 2014-08-10 DIAGNOSIS — I69354 Hemiplegia and hemiparesis following cerebral infarction affecting left non-dominant side: Secondary | ICD-10-CM | POA: Diagnosis not present

## 2014-08-10 DIAGNOSIS — F329 Major depressive disorder, single episode, unspecified: Secondary | ICD-10-CM | POA: Diagnosis not present

## 2014-08-10 DIAGNOSIS — I69322 Dysarthria following cerebral infarction: Secondary | ICD-10-CM | POA: Diagnosis not present

## 2014-08-10 DIAGNOSIS — R569 Unspecified convulsions: Secondary | ICD-10-CM | POA: Diagnosis not present

## 2014-08-12 DIAGNOSIS — I69322 Dysarthria following cerebral infarction: Secondary | ICD-10-CM | POA: Diagnosis not present

## 2014-08-12 DIAGNOSIS — F329 Major depressive disorder, single episode, unspecified: Secondary | ICD-10-CM | POA: Diagnosis not present

## 2014-08-12 DIAGNOSIS — I69354 Hemiplegia and hemiparesis following cerebral infarction affecting left non-dominant side: Secondary | ICD-10-CM | POA: Diagnosis not present

## 2014-08-12 DIAGNOSIS — R569 Unspecified convulsions: Secondary | ICD-10-CM | POA: Diagnosis not present

## 2014-08-12 DIAGNOSIS — I10 Essential (primary) hypertension: Secondary | ICD-10-CM | POA: Diagnosis not present

## 2014-08-15 DIAGNOSIS — I69322 Dysarthria following cerebral infarction: Secondary | ICD-10-CM | POA: Diagnosis not present

## 2014-08-15 DIAGNOSIS — F329 Major depressive disorder, single episode, unspecified: Secondary | ICD-10-CM | POA: Diagnosis not present

## 2014-08-15 DIAGNOSIS — I69354 Hemiplegia and hemiparesis following cerebral infarction affecting left non-dominant side: Secondary | ICD-10-CM | POA: Diagnosis not present

## 2014-08-15 DIAGNOSIS — I10 Essential (primary) hypertension: Secondary | ICD-10-CM | POA: Diagnosis not present

## 2014-08-15 DIAGNOSIS — R569 Unspecified convulsions: Secondary | ICD-10-CM | POA: Diagnosis not present

## 2014-08-17 DIAGNOSIS — I69354 Hemiplegia and hemiparesis following cerebral infarction affecting left non-dominant side: Secondary | ICD-10-CM | POA: Diagnosis not present

## 2014-08-17 DIAGNOSIS — R569 Unspecified convulsions: Secondary | ICD-10-CM | POA: Diagnosis not present

## 2014-08-17 DIAGNOSIS — F329 Major depressive disorder, single episode, unspecified: Secondary | ICD-10-CM | POA: Diagnosis not present

## 2014-08-17 DIAGNOSIS — I69322 Dysarthria following cerebral infarction: Secondary | ICD-10-CM | POA: Diagnosis not present

## 2014-08-17 DIAGNOSIS — I10 Essential (primary) hypertension: Secondary | ICD-10-CM | POA: Diagnosis not present

## 2014-08-23 DIAGNOSIS — I69322 Dysarthria following cerebral infarction: Secondary | ICD-10-CM | POA: Diagnosis not present

## 2014-08-23 DIAGNOSIS — Z974 Presence of external hearing-aid: Secondary | ICD-10-CM | POA: Diagnosis not present

## 2014-08-23 DIAGNOSIS — I10 Essential (primary) hypertension: Secondary | ICD-10-CM | POA: Diagnosis not present

## 2014-08-23 DIAGNOSIS — R569 Unspecified convulsions: Secondary | ICD-10-CM | POA: Diagnosis not present

## 2014-08-23 DIAGNOSIS — I69354 Hemiplegia and hemiparesis following cerebral infarction affecting left non-dominant side: Secondary | ICD-10-CM | POA: Diagnosis not present

## 2014-08-23 DIAGNOSIS — F329 Major depressive disorder, single episode, unspecified: Secondary | ICD-10-CM | POA: Diagnosis not present

## 2014-08-23 DIAGNOSIS — H903 Sensorineural hearing loss, bilateral: Secondary | ICD-10-CM | POA: Diagnosis not present

## 2014-08-26 DIAGNOSIS — F329 Major depressive disorder, single episode, unspecified: Secondary | ICD-10-CM | POA: Diagnosis not present

## 2014-08-26 DIAGNOSIS — I10 Essential (primary) hypertension: Secondary | ICD-10-CM | POA: Diagnosis not present

## 2014-08-26 DIAGNOSIS — I69322 Dysarthria following cerebral infarction: Secondary | ICD-10-CM | POA: Diagnosis not present

## 2014-08-26 DIAGNOSIS — I69354 Hemiplegia and hemiparesis following cerebral infarction affecting left non-dominant side: Secondary | ICD-10-CM | POA: Diagnosis not present

## 2014-08-26 DIAGNOSIS — R569 Unspecified convulsions: Secondary | ICD-10-CM | POA: Diagnosis not present

## 2014-08-29 ENCOUNTER — Encounter: Payer: Self-pay | Admitting: Emergency Medicine

## 2014-08-29 ENCOUNTER — Ambulatory Visit (INDEPENDENT_AMBULATORY_CARE_PROVIDER_SITE_OTHER): Payer: Medicare Other | Admitting: Emergency Medicine

## 2014-08-29 VITALS — BP 126/84 | HR 69 | Wt 132.0 lb

## 2014-08-29 DIAGNOSIS — I639 Cerebral infarction, unspecified: Secondary | ICD-10-CM | POA: Diagnosis not present

## 2014-08-29 DIAGNOSIS — I69322 Dysarthria following cerebral infarction: Secondary | ICD-10-CM | POA: Diagnosis not present

## 2014-08-29 DIAGNOSIS — J479 Bronchiectasis, uncomplicated: Secondary | ICD-10-CM | POA: Diagnosis not present

## 2014-08-29 DIAGNOSIS — R569 Unspecified convulsions: Secondary | ICD-10-CM | POA: Diagnosis not present

## 2014-08-29 DIAGNOSIS — I69354 Hemiplegia and hemiparesis following cerebral infarction affecting left non-dominant side: Secondary | ICD-10-CM | POA: Diagnosis not present

## 2014-08-29 DIAGNOSIS — I10 Essential (primary) hypertension: Secondary | ICD-10-CM | POA: Diagnosis not present

## 2014-08-29 DIAGNOSIS — F329 Major depressive disorder, single episode, unspecified: Secondary | ICD-10-CM | POA: Diagnosis not present

## 2014-08-29 NOTE — Assessment & Plan Note (Signed)
She does have some additional nodularity on's chest x-ray from her recent hospitalization. At this time a believe that the risk-benefit favors conservative management. We will follow her clinically. I will defer a CT scan of the chest at this time. I explained to the patient and to her daughter that although lung cancer is on the list of possibilities that a repeat CT scan at this time will most likely not change our therapeutic or diagnostic plans.

## 2014-08-29 NOTE — Assessment & Plan Note (Signed)
Xopenex when necessary

## 2014-08-29 NOTE — Patient Instructions (Signed)
Please continue to use Xopenex as needed for cough or shortness of breath We will not repeat your CT scan of the chest at this time. We will reconsider this in the future depending on your symptoms and her chest x-ray Follow with Dr Delton Coombes in 4 months or sooner if you have any problems.

## 2014-08-29 NOTE — Progress Notes (Signed)
Subjective:    Patient ID: Sarah [name], female    DOB: 10/06/1921, 81 y.o.   MRN: [id number]  HPI 81 yo never smoker, hx remote Legionella PNA, diagnosed with suspected asthma about 10 years ago. PFT's 7/10 with mixed disease, mild AFL. Formerly treated with Advair and albuterol, stopped when she moved to [place] in 2008. Stayed on Singulair. At original consult we d/c'd ACE-I. Started on Symbicort.   ROV 11/11/11 --  hx bronchiectasis, chronic coughing, CAD. CT scan consistent w MAIC. We have discussed empiric MAIC therapy, but decided to defer for now. She tells me that she was treated for bronchitis recently with abx + pred. Last time we added symbicort, she feels that it is helping her >> her cough is better.   ROV 03/10/12 -- hx bronchiectasis, chronic coughing, CAD. CT scan consistent w [facility]IC. She has had trouble since last visit, she has been dealing w panic attacks - some better on xanax. She has had some scratchy throat, hoarse voice. Her cough has overall been better. She remains on symbicort, uses albuterol 1-2x a day. Feels "bad" sometimes, weak, fatigued. ? Whether this relates to her hx MAIC. Remains on lisinopril.  ROV 06/03/12 -- bronchiectasis, chronic coughing, CAD. CT scan consistent w [facility]. She was admitted beginning of April for bronchitis and then severe GERD and CP (? Due to the prednisone). Currently sx are minimal. Very little cough. She stopped symbicort temporarily due to thrush - back on it now.   ROV 07/15/12 -- bronchiectasis, chronic coughing, CAD. CT scan consistent w MAIC. Tried stopping Symbicort > seems to have tolerated. Minimal cough. Using albuterol rarely She has anxiety attacks, has been having more trouble last few days.   ROV 02/05/13 -- bronchiectasis, chronic coughing, CAD. CT scan consistent w MAIC. She just had an AE of her bronchiectasis, some green sputum with blood tinges. Was treated with azithro and pred.  She is on albuterol prn  ROV 09/03/13  --  pleasant woman with bronchiectasis, CAD, ? [facility]IC based on CT chest. She has help at home, her husband was recently admitted to SNF and it is very difficult for her. Her citalopram was changed to zoloft 2 weeks ago. She reports 10 days of HA, dizziness. She has been coughing more - small bits of dark mucous. No hemoptysis. No fever   ROV 09/17/13 Bronchiectasis , CAD, ?MAIC based on CT  Returns for  1 week follow up for recent Bronchiectatic flare Tx w/ Zpack . Feeling much better.  Reports breathing is back to baseline.  no new complaints.  Acute OV  Complains of 2 week of productive cough with thick yellow white mucus . Was doing well until weather changed with rains/storms. No fever, orthopnea, edema or n/v/d.  Concerned cough and congestion are going to get worse.  Requests zpack , says it helped so much last ov.   ROV 12/17/13 -- follow up visit for chronic cough, bronchiectasis. She has been dong fairly well. She is going through a hard time because her husband has needed to move to a memory ward.  She is having some intermittent HA, no real cough right now. Last CT was 02/2012.   ROV 08/29/14 -- follow-up visit for bronchiectasis and presumed mycobacterial disease, associated chronic cough.  She was hospitalized in June for acute left cerebellar stroke.  History is taken from the patient and from her daughter Sharyl Nimrod. She is still having some L side weakness, R visual disturbance, imbalance.  She is  still having cough, she is using xopenex when she exerts and when she cough. She has had some episodes of rash and lip swelling, doesn't happen every day, got some better off codeine.                                                                                                                         Review of Systems As above      Objective:   Physical Exam  Filed Vitals:   08/29/14 1446  BP: 126/84  Pulse: 69  Weight: 132 lb (59.875 kg)  SpO2: 93%    GEN: A/Ox3; pleasant , NAD,  elderly and thin   HEENT:  [place]/AT,  NOSE-clear, THROAT-clear, no lesions, no postnasal drip or exudate noted.   NECK:  Supple w/ fair ROM; no JVD; normal carotid impulses w/o bruits; no thyromegaly or nodules palpated; no lymphadenopathy.  RESP  Few B insp crackles, more on the L , no dullness to percussion  CARD:  RRR, no m/r/g  , no peripheral edema, pulses intact, no cyanosis or clubbing.  Musco: Warm bil, no deformities or joint swelling noted.   Neuro: alert, no focal deficits noted.    Skin: Warm, no lesions or rashes      Assessment & Plan:  BRONCHIECTASIS She does have some additional nodularity on's chest x-ray from her recent hospitalization. At this time a believe that the risk-benefit favors conservative management. We will follow her clinically. I will defer a CT scan of the chest at this time. I explained to the patient and to her daughter that although lung cancer is on the list of possibilities that a repeat CT scan at this time will most likely not change our therapeutic or diagnostic plans.   ASTHMA Xopenex when necessary

## 2014-08-31 DIAGNOSIS — R569 Unspecified convulsions: Secondary | ICD-10-CM | POA: Diagnosis not present

## 2014-08-31 DIAGNOSIS — F329 Major depressive disorder, single episode, unspecified: Secondary | ICD-10-CM | POA: Diagnosis not present

## 2014-08-31 DIAGNOSIS — I69354 Hemiplegia and hemiparesis following cerebral infarction affecting left non-dominant side: Secondary | ICD-10-CM | POA: Diagnosis not present

## 2014-08-31 DIAGNOSIS — I69322 Dysarthria following cerebral infarction: Secondary | ICD-10-CM | POA: Diagnosis not present

## 2014-08-31 DIAGNOSIS — I10 Essential (primary) hypertension: Secondary | ICD-10-CM | POA: Diagnosis not present

## 2014-09-01 DIAGNOSIS — H903 Sensorineural hearing loss, bilateral: Secondary | ICD-10-CM | POA: Diagnosis not present

## 2014-09-02 DIAGNOSIS — R21 Rash and other nonspecific skin eruption: Secondary | ICD-10-CM | POA: Diagnosis not present

## 2014-09-05 DIAGNOSIS — I69322 Dysarthria following cerebral infarction: Secondary | ICD-10-CM | POA: Diagnosis not present

## 2014-09-05 DIAGNOSIS — I69354 Hemiplegia and hemiparesis following cerebral infarction affecting left non-dominant side: Secondary | ICD-10-CM | POA: Diagnosis not present

## 2014-09-05 DIAGNOSIS — R569 Unspecified convulsions: Secondary | ICD-10-CM | POA: Diagnosis not present

## 2014-09-05 DIAGNOSIS — F329 Major depressive disorder, single episode, unspecified: Secondary | ICD-10-CM | POA: Diagnosis not present

## 2014-09-05 DIAGNOSIS — I10 Essential (primary) hypertension: Secondary | ICD-10-CM | POA: Diagnosis not present

## 2014-09-07 DIAGNOSIS — I69322 Dysarthria following cerebral infarction: Secondary | ICD-10-CM | POA: Diagnosis not present

## 2014-09-07 DIAGNOSIS — R569 Unspecified convulsions: Secondary | ICD-10-CM | POA: Diagnosis not present

## 2014-09-07 DIAGNOSIS — F329 Major depressive disorder, single episode, unspecified: Secondary | ICD-10-CM | POA: Diagnosis not present

## 2014-09-07 DIAGNOSIS — I69354 Hemiplegia and hemiparesis following cerebral infarction affecting left non-dominant side: Secondary | ICD-10-CM | POA: Diagnosis not present

## 2014-09-07 DIAGNOSIS — I10 Essential (primary) hypertension: Secondary | ICD-10-CM | POA: Diagnosis not present

## 2014-09-08 ENCOUNTER — Ambulatory Visit (INDEPENDENT_AMBULATORY_CARE_PROVIDER_SITE_OTHER): Payer: Medicare Other | Admitting: Neurology

## 2014-09-08 ENCOUNTER — Encounter: Payer: Self-pay | Admitting: Neurology

## 2014-09-08 VITALS — BP 170/91 | HR 75 | Ht 66.0 in | Wt 133.0 lb

## 2014-09-08 DIAGNOSIS — I6302 Cerebral infarction due to thrombosis of basilar artery: Secondary | ICD-10-CM | POA: Diagnosis not present

## 2014-09-08 DIAGNOSIS — E785 Hyperlipidemia, unspecified: Secondary | ICD-10-CM

## 2014-09-08 DIAGNOSIS — I10 Essential (primary) hypertension: Secondary | ICD-10-CM

## 2014-09-08 DIAGNOSIS — I671 Cerebral aneurysm, nonruptured: Secondary | ICD-10-CM

## 2014-09-08 MED ORDER — AMLODIPINE BESYLATE 10 MG PO TABS: 10.0000 mg | ORAL_TABLET | Freq: Every day | ORAL | Status: DC

## 2014-09-08 NOTE — Progress Notes (Signed)
STROKE NEUROLOGY FOLLOW UP NOTE  NAME: Sarah [name] DOB: 1921-08-17  REASON FOR VISIT: stroke follow up HISTORY FROM: pt and chart  Today we had the pleasure of seeing Sarah [name] in follow-up at our Neurology Clinic. Pt was accompanied by son in law.   History Summary Sarah [name] is a 81 y.o. female with history ofTIAs, CHF, HTN, HLD, CAD with MI, migraine headaches, remote seizure disorder, and bilateral cavernous ICA aneurysms was admitted to [facility] on 07/08/14 for left facial droop, left arm weakness, gait imbalance and dysarthria. She did not receive IV t-PA due to improvement in deficits. MRI showed left SCA territory infarct, likely due to small vessel disease. CUS and 2D echo unremarkable. LDL 99 and A1C 6.0. She was on home ASA, so she was started on plavix and lipitor. Symptoms resolved and she was discharged to home with home health PT.  During admission, CXR showed right upper lobe mass, no further evaluation performed due to pt optioned no aggressive treatment even if found out to be malignancy. She was told to follow up with her pulmonologist [name].  MRA also showed b/l cavernous ICA aneurysm, comparing with MRA in 2013, right from 8x7 to now 10x9, left from 2-3 to now 49m. She was told to follow up outpt with neurology.  She had ED visit on 01/15/14 for right eye pain and vision loss with white out vision, neurology consulted, CT negative, MRI brain no stroke or no orbital pathology. ESR 35 ruled out GCA. She followed up with ophthalmology and gradually right eye vision loss resolved as well as eye pain. However, during the recent stroke, she had diplopia which was resolved. But then she had right eye visual decline and followed again with eye doctor, unclear etiology now and was told to follow up in 6 months.   Interval History During the interval time, the patient has been doing well. She followed up with Dr. BLamonte Sakaiand was told likely to be bronchiectasis, and  favors conservative management instead of CT chest with aggressive work up or treatment.      Her BP was high today 170/91. Her record also showing her BP at home ranging from 160-185. She is only on metoprolol 12.5 twice a day with metoprolol PRN at ALF. She stated that her BP sometimes spiked to 240. She has appointment on 09/14/2014 to see her PCP.  Still has right eye vision decreased, no change recently, ophthalmology told her to follow-up in 6 months, no diagnosis was given yet.  REVIEW OF SYSTEMS: Full 14 system review of systems performed and notable only for those listed below and in HPI above, all others are negative:  Constitutional:   Cardiovascular:  Ear/Nose/Throat:   Skin:  Eyes:  Blurry vision Respiratory:  Wheezing Gastroitestinal:   Genitourinary:  Hematology/Lymphatic:   Endocrine:  Musculoskeletal:   Allergy/Immunology:   Neurological:  Headache Psychiatric: Anxiety, disinterest in activities Sleep:   The following represents the patient's updated allergies and side effects list: Allergies  Allergen Reactions  . Codeine Anaphylaxis  . Hydrocodone Nausea And Vomiting and Other (See Comments)    SYNCOPE AND BRADYCARDIA  . Isosorbide Other (See Comments)    BRADYCARDIA  . Oxycodone Palpitations    Rapid heart beat  . Clarithromycin Nausea And Vomiting  . Fiorinal [Butalbital-Aspirin-Caffeine] Swelling and Other (See Comments)    Facial swelling   . Levofloxacin Nausea And Vomiting and Other (See Comments)    SYNCOPE ALSO    The neurologically  relevant items on the patient's problem list were reviewed on today's visit.  Neurologic Examination  A problem focused neurological exam (12 or more points of the single system neurologic examination, vital signs counts as 1 point, cranial nerves count for 8 points) was performed.  Blood pressure 170/91, pulse 75, height '5\' 6"'  (1.676 m), weight 133 lb (60.328 kg), last menstrual period 01/14/1970.  General -  Well nourished, well developed, in no apparent distress, mildly anxious.  Ophthalmologic - Fundi not visualized due to eye movement.  Cardiovascular - Regular rate and rhythm with no murmur.  Mental Status -  Level of arousal and orientation to time, place, and person were intact. Language including expression, naming, repetition, comprehension was assessed and found intact. Concentration and attention intact Memory registration 3/3, delayed recall 1/3. Fund of Knowledge was assessed and was intact.  Cranial Nerves II - XII - II - Visual field intact OU. Right eye visual acuity decrease to finger counting. III, IV, VI - Extraocular movements intact. V - Facial sensation intact bilaterally. VII - Facial movement intact bilaterally. VIII - Hearing & vestibular intact bilaterally. X - Palate elevates symmetrically. XI - Chin turning & shoulder shrug intact bilaterally. XII - Tongue protrusion intact.  Motor Strength - The patient's strength was normal in all extremities and pronator drift was absent.  Bulk was normal and fasciculations were absent.   Motor Tone - Muscle tone was assessed at the neck and appendages and was normal.  Reflexes - The patient's reflexes were 1+ in all extremities and she had no pathological reflexes.  Sensory - Light touch, temperature/pinprick were assessed and were normal.    Coordination - The patient had normal movements in the hands with no ataxia or dysmetria.  Tremor was absent.  Gait and Station - walk with walker, stooped posturing, small stride, slow, but steady, no fall.  Data reviewed: I personally reviewed the images and agree with the radiology interpretations.  Dg Chest 2 View 07/08/2014  1. Cardiomegaly without edema.  2. Left lower lobe infiltrate, likely infectious.  3. Suspicious right upper lobe mass. Further evaluation with chest CT is recommended. Contrast administration is recommended unless contraindicated.   Ct Head Wo  Contrast 07/08/2014  No acute intracranial findings. Chronic changes as described.   MRI / MRA Brain Wo Contrast 07/08/2014  1. 2 cm acute left cerebellar SCA territory infarct.  2. Mild chronic small vessel ischemic disease.  3. Large left mastoid effusion.  4. No major intracranial arterial occlusion or significant proximal stenosis.  5. Bilateral cavernous carotid aneurysms, slightly increased in size since 2013 MRA.  The aneurysm on the right measures approximately 10 x 9 mm (previously 8 x 7 mm on prior MRA). Left cavernous carotid aneurysm measures 4 mm (previously 2-3 mm).   2D echo - - Left ventricle: The cavity size was normal. Systolic function was vigorous. The estimated ejection fraction was in the range of 65% to 70%. Wall motion was normal; there were no regional wall motion abnormalities. Features are consistent with a pseudonormal left ventricular filling pattern, with concomitant abnormal relaxation and increased filling pressure (grade 2 diastolic dysfunction). There was no evidence of elevated ventricular filling pressure by Doppler parameters. - Aortic valve: Trileaflet; mildly thickened leaflets. There was no regurgitation. - Aortic root: The aortic root was normal in size. - Mitral valve: Structurally normal valve. There was mild regurgitation. - Left atrium: The atrium was mildly dilated. - Right ventricle: Systolic function was normal. - Tricuspid valve:  There was trivial regurgitation. - Pulmonic valve: There was no regurgitation. - Pulmonary arteries: Systolic pressure was within the normal range. - Inferior vena cava: The vessel was normal in size. - Pericardium, extracardiac: There was no pericardial effusion.  CUS - Bilateral: 1-39% ICA stenosis. Vertebral artery flow is antegrade.  Component     Latest Ref Rng 07/09/2014  Cholesterol     0 - 200 mg/dL 179  Triglycerides     <150  mg/dL 66  HDL Cholesterol     >40 mg/dL 67  Total CHOL/HDL Ratio      2.7  VLDL     0 - 40 mg/dL 13  LDL (calc)     0 - 99 mg/dL 99  Hemoglobin A1C     4.8 - 5.6 % 6.0 (H)  Mean Plasma Glucose      126    Assessment: As you may recall, she is a 81 y.o. Caucasian female with PMH of TIAs, CHF, HTN, HLD, CAD with MI, migraine headaches, remote seizure disorder, and bilateral cavernous ICA aneurysms was admitted to [facility] on 07/08/14 for left SCA territory infarct, likely due to small vessel disease. CUS and 2D echo unremarkable. LDL 99 and A1C 6.0. She was discharged on plavix and lipitor. Symptoms resolved. She still has right eye vision decrease, not able to be explained by stroke, she is following with her ophthalmologist. MRA during admission again showing bilateral cavernous ICA aneurysm, slightly bigger than 3 years ago. Currently, patient option conservative management for the aneurysms. However, her BP was high, when given from 160-185, need to be better controlled, goal less than 140 2 decrease risk of aneurysm rupture. She is on metoprolol 12.5 bid, will add amlodipine 10 mg daily. Has PCP follow-up 09/14/14.  Plan:  - continue plavix and lipitor for stroke prevention. - BP high, due to brain aneurysm, goal BP <140.  - continue metoprolol 12.29m bid, but add norvasc 181mdaily.  - check BP twice a day, and record and bring over to PCP on 09/14/14 for BP med adjustment - follow up with Dr. ByLamonte Sakaiegularly - follow up with ophthalmology for right vision loss. - Follow up with your primary care physician for stroke risk factor modification. Recommend maintain blood pressure goal <140/80, diabetes with hemoglobin A1c goal below 6.5% and lipids with LDL cholesterol goal below 70 mg/dL.  - RTC in 2 months.  I spent more than 45 minutes of face to face time with the patient. Greater than 50% of time was spent in counseling and coordination of care. We have discussed about several issues,  including lung mass, right vision loss, blood pressure treatment, brain aneurysm management.   No orders of the defined types were placed in this encounter.    Meds ordered this encounter  Medications  . latanoprost (XALATAN) 0.005 % ophthalmic solution    Sig:   . amLODipine (NORVASC) 10 MG tablet    Sig: Take 1 tablet (10 mg total) by mouth daily.    Dispense:  30 tablet    Refill:  2    Start today 09/08/14 if possible.    Patient Instructions  - continue plavix and lipitor for stroke prevention. - your BP at high side, due to brain aneurysm, we would like BP less than 140.  - continue metoprolol half tab twice a day, but we will prescribe norvasc to you take 1039maily.  - check BP twice a day, in the morning and in the afternoon, and record  and bring over to PCP next Tuesday for BP med adjustment - follow up with [name] regularly - follow up with your eye doctor for right vision loss. - Follow up with your primary care physician for stroke risk factor modification. Recommend maintain blood pressure goal <140/80, diabetes with hemoglobin A1c goal below 6.5% and lipids with LDL cholesterol goal below 70 mg/dL.  - follow up in 2 months.     Rosalin Hawking, MD PhD [facility] [address] [place], [place] [address] [phone]

## 2014-09-08 NOTE — Patient Instructions (Signed)
-   continue plavix and lipitor for stroke prevention. - your BP at high side, due to brain aneurysm, we would like BP less than 140.  - continue metoprolol half tab twice a day, but we will prescribe norvasc to you take  daily.  - check BP twice a day, in the morning and in the afternoon, and record and bring over to PCP next Tuesday for BP med adjustment - follow up with Dr. Delton Coombes regularly - follow up with your eye doctor for right vision loss. - Follow up with your primary care physician for stroke risk factor modification. Recommend maintain blood pressure goal <140/80, diabetes with hemoglobin A1c goal below 6.5% and lipids with LDL cholesterol goal below 70 mg/dL.  - follow up in 2 months.

## 2014-09-09 DIAGNOSIS — I6302 Cerebral infarction due to thrombosis of basilar artery: Secondary | ICD-10-CM | POA: Insufficient documentation

## 2014-09-09 DIAGNOSIS — I671 Cerebral aneurysm, nonruptured: Secondary | ICD-10-CM | POA: Insufficient documentation

## 2014-09-09 DIAGNOSIS — E785 Hyperlipidemia, unspecified: Secondary | ICD-10-CM | POA: Insufficient documentation

## 2014-09-13 DIAGNOSIS — R21 Rash and other nonspecific skin eruption: Secondary | ICD-10-CM | POA: Diagnosis not present

## 2014-09-13 DIAGNOSIS — E039 Hypothyroidism, unspecified: Secondary | ICD-10-CM | POA: Diagnosis not present

## 2014-09-13 DIAGNOSIS — D539 Nutritional anemia, unspecified: Secondary | ICD-10-CM | POA: Diagnosis not present

## 2014-09-13 DIAGNOSIS — M899 Disorder of bone, unspecified: Secondary | ICD-10-CM | POA: Diagnosis not present

## 2014-09-13 DIAGNOSIS — Z79899 Other long term (current) drug therapy: Secondary | ICD-10-CM | POA: Diagnosis not present

## 2014-09-13 DIAGNOSIS — D649 Anemia, unspecified: Secondary | ICD-10-CM | POA: Diagnosis not present

## 2014-09-15 ENCOUNTER — Telehealth: Payer: Self-pay | Admitting: *Deleted

## 2014-09-15 NOTE — Telephone Encounter (Signed)
Pharmacist called from [facility] for refill on minivelle patch, per note on 07/21/14 "I've asked her to use this twice weekly for one month and then 1 per week for several weeks and then stop to allow for a more gradual decline in the estrogen and hopefully avoid symptoms. Patient and her daughter both understand and agree with the plan."  I relayed this to pharmacy and no refills, if patient is having symptoms her daughter will need to call to speak with office. Rx will not be refilled.

## 2014-09-20 ENCOUNTER — Ambulatory Visit: Payer: Medicare Other | Admitting: Neurology

## 2014-09-26 DIAGNOSIS — E559 Vitamin D deficiency, unspecified: Secondary | ICD-10-CM | POA: Diagnosis not present

## 2014-09-26 DIAGNOSIS — E039 Hypothyroidism, unspecified: Secondary | ICD-10-CM | POA: Diagnosis not present

## 2014-09-26 DIAGNOSIS — R600 Localized edema: Secondary | ICD-10-CM | POA: Diagnosis not present

## 2014-09-26 DIAGNOSIS — I639 Cerebral infarction, unspecified: Secondary | ICD-10-CM | POA: Diagnosis not present

## 2014-09-26 DIAGNOSIS — R7309 Other abnormal glucose: Secondary | ICD-10-CM | POA: Diagnosis not present

## 2014-10-06 ENCOUNTER — Encounter (HOSPITAL_COMMUNITY): Payer: Self-pay | Admitting: *Deleted

## 2014-10-06 ENCOUNTER — Emergency Department (HOSPITAL_COMMUNITY): Payer: Medicare Other

## 2014-10-06 ENCOUNTER — Inpatient Hospital Stay (HOSPITAL_COMMUNITY)
Admission: EM | Admit: 2014-10-06 | Discharge: 2014-10-08 | DRG: 392 | Disposition: A | Discharge status: Home / Self Care | Payer: Medicare Other | Attending: Internal Medicine | Admitting: Internal Medicine

## 2014-10-06 DIAGNOSIS — K529 Noninfective gastroenteritis and colitis, unspecified: Secondary | ICD-10-CM | POA: Diagnosis not present

## 2014-10-06 DIAGNOSIS — Z885 Allergy status to narcotic agent status: Secondary | ICD-10-CM | POA: Diagnosis not present

## 2014-10-06 DIAGNOSIS — F329 Major depressive disorder, single episode, unspecified: Secondary | ICD-10-CM | POA: Diagnosis present

## 2014-10-06 DIAGNOSIS — K5900 Constipation, unspecified: Secondary | ICD-10-CM | POA: Diagnosis not present

## 2014-10-06 DIAGNOSIS — D72829 Elevated white blood cell count, unspecified: Secondary | ICD-10-CM

## 2014-10-06 DIAGNOSIS — Z881 Allergy status to other antibiotic agents status: Secondary | ICD-10-CM

## 2014-10-06 DIAGNOSIS — R29810 Facial weakness: Secondary | ICD-10-CM | POA: Diagnosis not present

## 2014-10-06 DIAGNOSIS — Z7901 Long term (current) use of anticoagulants: Secondary | ICD-10-CM

## 2014-10-06 DIAGNOSIS — Z7902 Long term (current) use of antithrombotics/antiplatelets: Secondary | ICD-10-CM | POA: Diagnosis not present

## 2014-10-06 DIAGNOSIS — R000 Tachycardia, unspecified: Secondary | ICD-10-CM | POA: Diagnosis present

## 2014-10-06 DIAGNOSIS — Z888 Allergy status to other drugs, medicaments and biological substances status: Secondary | ICD-10-CM

## 2014-10-06 DIAGNOSIS — E039 Hypothyroidism, unspecified: Secondary | ICD-10-CM | POA: Diagnosis present

## 2014-10-06 DIAGNOSIS — Z8673 Personal history of transient ischemic attack (TIA), and cerebral infarction without residual deficits: Secondary | ICD-10-CM | POA: Diagnosis not present

## 2014-10-06 DIAGNOSIS — I6789 Other cerebrovascular disease: Secondary | ICD-10-CM | POA: Diagnosis not present

## 2014-10-06 DIAGNOSIS — Z79899 Other long term (current) drug therapy: Secondary | ICD-10-CM | POA: Diagnosis not present

## 2014-10-06 DIAGNOSIS — R109 Unspecified abdominal pain: Secondary | ICD-10-CM | POA: Diagnosis not present

## 2014-10-06 DIAGNOSIS — I5032 Chronic diastolic (congestive) heart failure: Secondary | ICD-10-CM | POA: Diagnosis not present

## 2014-10-06 DIAGNOSIS — I10 Essential (primary) hypertension: Secondary | ICD-10-CM | POA: Diagnosis not present

## 2014-10-06 DIAGNOSIS — J45909 Unspecified asthma, uncomplicated: Secondary | ICD-10-CM | POA: Diagnosis present

## 2014-10-06 DIAGNOSIS — I252 Old myocardial infarction: Secondary | ICD-10-CM

## 2014-10-06 DIAGNOSIS — K5641 Fecal impaction: Secondary | ICD-10-CM | POA: Diagnosis present

## 2014-10-06 DIAGNOSIS — R55 Syncope and collapse: Secondary | ICD-10-CM | POA: Diagnosis not present

## 2014-10-06 LAB — URINALYSIS, ROUTINE W REFLEX MICROSCOPIC
Bilirubin Urine: NEGATIVE
Glucose, UA: NEGATIVE mg/dL
HGB URINE DIPSTICK: NEGATIVE
Ketones, ur: NEGATIVE mg/dL
LEUKOCYTES UA: NEGATIVE
NITRITE: NEGATIVE
PROTEIN: NEGATIVE mg/dL
SPECIFIC GRAVITY, URINE: 1.017 (ref 1.005–1.030)
UROBILINOGEN UA: 1 mg/dL (ref 0.0–1.0)
pH: 6 (ref 5.0–8.0)

## 2014-10-06 LAB — CBC WITH DIFFERENTIAL/PLATELET
Basophils Absolute: 0 10*3/uL (ref 0.0–0.1)
Basophils Relative: 0 %
EOS ABS: 0.1 10*3/uL (ref 0.0–0.7)
EOS PCT: 1 %
HCT: 32.4 % — ABNORMAL LOW (ref 36.0–46.0)
Hemoglobin: 9.7 g/dL — ABNORMAL LOW (ref 12.0–15.0)
Lymphocytes Relative: 6 %
Lymphs Abs: 0.9 10*3/uL (ref 0.7–4.0)
MCH: 24.7 pg — AB (ref 26.0–34.0)
MCHC: 29.9 g/dL — AB (ref 30.0–36.0)
MCV: 82.4 fL (ref 78.0–100.0)
Monocytes Absolute: 0.9 10*3/uL (ref 0.1–1.0)
Monocytes Relative: 5 %
Neutro Abs: 14.9 10*3/uL — ABNORMAL HIGH (ref 1.7–7.7)
Neutrophils Relative %: 88 %
PLATELETS: 227 10*3/uL (ref 150–400)
RBC: 3.93 MIL/uL (ref 3.87–5.11)
RDW: 17.1 % — ABNORMAL HIGH (ref 11.5–15.5)
WBC: 16.9 10*3/uL — AB (ref 4.0–10.5)

## 2014-10-06 LAB — COMPREHENSIVE METABOLIC PANEL
ALBUMIN: 3.6 g/dL (ref 3.5–5.0)
ALK PHOS: 100 U/L (ref 38–126)
ALT: 15 U/L (ref 14–54)
AST: 30 U/L (ref 15–41)
Anion gap: 10 (ref 5–15)
BILIRUBIN TOTAL: 0.3 mg/dL (ref 0.3–1.2)
BUN: 19 mg/dL (ref 6–20)
CALCIUM: 8.2 mg/dL — AB (ref 8.9–10.3)
CO2: 24 mmol/L (ref 22–32)
CREATININE: 0.95 mg/dL (ref 0.44–1.00)
Chloride: 105 mmol/L (ref 101–111)
GFR calc Af Amer: 59 mL/min — ABNORMAL LOW (ref >60)
GFR, EST NON AFRICAN AMERICAN: 51 mL/min — AB (ref >60)
GLUCOSE: 147 mg/dL — AB (ref 65–99)
Potassium: 4 mmol/L (ref 3.5–5.1)
SODIUM: 139 mmol/L (ref 135–145)
TOTAL PROTEIN: 6.7 g/dL (ref 6.5–8.1)

## 2014-10-06 LAB — TROPONIN I

## 2014-10-06 LAB — CBG MONITORING, ED: Glucose-Capillary: 137 mg/dL — ABNORMAL HIGH (ref 65–99)

## 2014-10-06 MED ORDER — AMLODIPINE BESYLATE 10 MG PO TABS
10.0000 mg | ORAL_TABLET | Freq: Every day | ORAL | Status: DC
  Administered 2014-10-07 – 2014-10-08 (×2): 10 mg via ORAL
  Filled 2014-10-06 (×2): qty 1

## 2014-10-06 MED ORDER — BISACODYL 5 MG PO TBEC: 5.0000 mg | DELAYED_RELEASE_TABLET | Freq: Every day | ORAL | Status: DC | PRN

## 2014-10-06 MED ORDER — METOPROLOL TARTRATE 25 MG PO TABS
12.5000 mg | ORAL_TABLET | Freq: Two times a day (BID) | ORAL | Status: DC
Start: 2014-10-06 — End: 2014-10-08
  Administered 2014-10-06 – 2014-10-08 (×4): 12.5 mg via ORAL
  Filled 2014-10-06 (×4): qty 1

## 2014-10-06 MED ORDER — SODIUM CHLORIDE 0.9 % IV BOLUS (SEPSIS)
500.0000 mL | Freq: Once | INTRAVENOUS | Status: AC
  Administered 2014-10-06: 500 mL via INTRAVENOUS

## 2014-10-06 MED ORDER — LATANOPROST 0.005 % OP SOLN
1.0000 [drp] | Freq: Every day | OPHTHALMIC | Status: DC
  Administered 2014-10-06 – 2014-10-07 (×2): 1 [drp] via OPHTHALMIC
  Filled 2014-10-06: qty 2.5

## 2014-10-06 MED ORDER — ALBUTEROL SULFATE (2.5 MG/3ML) 0.083% IN NEBU
5.0000 mg | INHALATION_SOLUTION | Freq: Once | RESPIRATORY_TRACT | Status: AC
  Administered 2014-10-06: 5 mg via RESPIRATORY_TRACT
  Filled 2014-10-06: qty 6

## 2014-10-06 MED ORDER — DULOXETINE HCL 20 MG PO CPEP
20.0000 mg | ORAL_CAPSULE | Freq: Three times a day (TID) | ORAL | Status: DC
  Administered 2014-10-06 – 2014-10-08 (×5): 20 mg via ORAL
  Filled 2014-10-06 (×6): qty 1

## 2014-10-06 MED ORDER — CLOPIDOGREL BISULFATE 75 MG PO TABS
75.0000 mg | ORAL_TABLET | Freq: Every day | ORAL | Status: DC
  Administered 2014-10-07 – 2014-10-08 (×2): 75 mg via ORAL
  Filled 2014-10-06 (×2): qty 1

## 2014-10-06 MED ORDER — HEPARIN SODIUM (PORCINE) 5000 UNIT/ML IJ SOLN
5000.0000 [IU] | Freq: Three times a day (TID) | INTRAMUSCULAR | Status: DC
  Administered 2014-10-06 – 2014-10-08 (×5): 5000 [IU] via SUBCUTANEOUS
  Filled 2014-10-06 (×7): qty 1

## 2014-10-06 MED ORDER — LORATADINE 10 MG PO TABS
10.0000 mg | ORAL_TABLET | Freq: Every day | ORAL | Status: DC
  Administered 2014-10-07 – 2014-10-08 (×2): 10 mg via ORAL
  Filled 2014-10-06 (×2): qty 1

## 2014-10-06 MED ORDER — IOHEXOL 300 MG/ML  SOLN
100.0000 mL | Freq: Once | INTRAMUSCULAR | Status: AC | PRN
  Administered 2014-10-06: 100 mL via INTRAVENOUS

## 2014-10-06 MED ORDER — METRONIDAZOLE 500 MG PO TABS
500.0000 mg | ORAL_TABLET | Freq: Once | ORAL | Status: AC
  Administered 2014-10-06: 500 mg via ORAL
  Filled 2014-10-06: qty 1

## 2014-10-06 MED ORDER — LEVOTHYROXINE SODIUM 50 MCG PO TABS
50.0000 ug | ORAL_TABLET | Freq: Every day | ORAL | Status: DC
  Administered 2014-10-07 – 2014-10-08 (×2): 50 ug via ORAL
  Filled 2014-10-06 (×2): qty 1

## 2014-10-06 MED ORDER — ALBUTEROL SULFATE (2.5 MG/3ML) 0.083% IN NEBU
3.0000 mL | INHALATION_SOLUTION | Freq: Four times a day (QID) | RESPIRATORY_TRACT | Status: DC | PRN
  Administered 2014-10-07: 3 mL via RESPIRATORY_TRACT
  Filled 2014-10-06: qty 3

## 2014-10-06 MED ORDER — CLONAZEPAM 0.5 MG PO TABS
0.2500 mg | ORAL_TABLET | Freq: Every day | ORAL | Status: DC
  Administered 2014-10-06 – 2014-10-07 (×2): 0.25 mg via ORAL
  Filled 2014-10-06 (×2): qty 1

## 2014-10-06 MED ORDER — CIPROFLOXACIN IN D5W 400 MG/200ML IV SOLN
400.0000 mg | Freq: Two times a day (BID) | INTRAVENOUS | Status: DC
  Administered 2014-10-07 – 2014-10-08 (×3): 400 mg via INTRAVENOUS
  Filled 2014-10-06 (×3): qty 200

## 2014-10-06 MED ORDER — CIPROFLOXACIN HCL 500 MG PO TABS
500.0000 mg | ORAL_TABLET | Freq: Once | ORAL | Status: AC
  Administered 2014-10-06: 500 mg via ORAL
  Filled 2014-10-06: qty 1

## 2014-10-06 MED ORDER — METRONIDAZOLE IN NACL 5-0.79 MG/ML-% IV SOLN
500.0000 mg | Freq: Three times a day (TID) | INTRAVENOUS | Status: DC
  Administered 2014-10-07 – 2014-10-08 (×4): 500 mg via INTRAVENOUS
  Filled 2014-10-06 (×5): qty 100

## 2014-10-06 MED ORDER — ALPRAZOLAM 0.5 MG PO TABS
0.5000 mg | ORAL_TABLET | Freq: Two times a day (BID) | ORAL | Status: DC
  Administered 2014-10-07 – 2014-10-08 (×3): 0.5 mg via ORAL
  Filled 2014-10-06 (×3): qty 1

## 2014-10-06 MED ORDER — ESCITALOPRAM OXALATE 5 MG PO TABS
2.5000 mg | ORAL_TABLET | Freq: Every day | ORAL | Status: DC
  Administered 2014-10-07: 2.5 mg via ORAL
  Filled 2014-10-06 (×2): qty 1

## 2014-10-06 MED ORDER — ATORVASTATIN CALCIUM 10 MG PO TABS
10.0000 mg | ORAL_TABLET | Freq: Every day | ORAL | Status: DC
  Administered 2014-10-07: 10 mg via ORAL
  Filled 2014-10-06 (×2): qty 1

## 2014-10-06 NOTE — H&P (Signed)
[facility] History and Physical  Alette Kataoka WUJ:[id number] DOB: 10/19/1921 DOA: 10/06/2014  Referring physician: EDP PCP: Emeterio Reeve, MD   Chief Complaint: Near-syncope   HPI: Sarah [name] is a 81 y.o. female presents to the ED with near-syncope today while on toilet.  She has had abdominal pain, 2/10 in severity for the past several days.  Finally had 2x BMs after arriving to the ED.  Has a h/o CVA on 6/16 with residual L facial droop.  No N/V/D.  Has been having intermittent fevers over past couple of days.  Review of Systems: Systems reviewed.  As above, otherwise negative  Past Medical History  Diagnosis Date  . Bronchiectasis     with history of Legionaires with chronic rales/rhonchi  . Pollen allergies   . Hypertension     Negative renal duplex in December of 2012  . Allergic rhinitis   . Diastolic dysfunction     a. per echo 08/2010 with normal LV function;  b. 06/2011 Echo: EF 65-70%  . Idiopathic peripheral neuropathy   . Hypothyroidism   . Depression   . H/O hiatal hernia   . Osteopenia 02/2011    t score - 2.1  . Hypercholesteremia   . Chest pain at rest   . NSTEMI (non-ST elevated myocardial infarction)     a. Normal coronaries per cath August 2012 and negative CT angio for PE;  b. 06/2011 Repeat admission w/ chest pain and elevated troponin's, CTA Chest w/o PE  . Asthma   . MAC (mycobacterium avium-intracellulare complex)   . Shortness of breath     "sometimes just lying down" (04/15/2012)  . Pneumonia     "recurrent" (04/15/2012)  . H/O Legionnaire's disease 11/1976  . History of blood transfusion 1972    "w/hysterectomy" (04/15/2012)  . GERD (gastroesophageal reflux disease)     "just recently" (04/15/2012)  . Daily headache     "over the last week" 04/15/2012   . Migraines     "outgrew them" (04/15/2012)  . Seizures 1930's    "3-4 as a preteen" (04/15/2012)  . Arthritis     "a little; in my back and hands" (04/15/2012)  . Anxiety     Anxiety  attacks  . Stroke 06/2014    cerebellum   Past Surgical History  Procedure Laterality Date  . Dilation and curettage of uterus    . Appendectomy    . Cardiac catheterization  August 2012    Normal coronaries.  . Tonsillectomy  1930  . Total abdominal hysterectomy w/ bilateral salpingoophorectomy  1972    leiomyomata, menorrhagia   Social History:  reports that she has never smoked. She has never used smokeless tobacco. She reports that she drinks about 1.2 oz of alcohol per week. She reports that she does not use illicit drugs.  Allergies  Allergen Reactions  . Codeine Anaphylaxis  . Hydrocodone Nausea And Vomiting and Other (See Comments)    SYNCOPE AND BRADYCARDIA  . Isosorbide Other (See Comments)    BRADYCARDIA  . Oxycodone Palpitations    Rapid heart beat  . Clarithromycin Nausea And Vomiting  . Fiorinal [Butalbital-Aspirin-Caffeine] Swelling and Other (See Comments)    Facial swelling   . Levofloxacin Nausea And Vomiting and Other (See Comments)    SYNCOPE ALSO    Family History  Problem Relation Age of Onset  . Hypertension Mother     stroke  . Stroke Mother   . Cancer Brother     prostate  . Osteoarthritis  Paternal Aunt      Prior to Admission medications   Medication Sig Start Date End Date Taking? Authorizing Provider  ALPRAZolam Prudy Feeler) 0.5 MG tablet Take 0.5 mg by mouth 2 (two) times daily. CAN TAKE AN ADDT'L TABLET AS NEEDED FOR ANXIETY OR PANIC ATTACK   Yes Historical Provider, MD  amLODipine (NORVASC) 10 MG tablet Take 1 tablet (10 mg total) by mouth daily. 09/08/14  Yes Marvel Plan, MD  atorvastatin (LIPITOR) 10 MG tablet Take 1 tablet (10 mg total) by mouth daily at 6 PM. 07/10/14  Yes Catarina Hartshorn, MD  calcium carbonate (TUMS - DOSED IN MG ELEMENTAL CALCIUM) 500 MG chewable tablet Chew 1 tablet by mouth every 8 (eight) hours as needed for indigestion or heartburn.   Yes Historical Provider, MD  cetirizine (ZYRTEC) 10 MG tablet Take 10 mg by mouth as needed  for allergies.   Yes Historical Provider, MD  clonazePAM (KLONOPIN) 0.5 MG tablet Take 0.25 mg by mouth at bedtime.  04/06/13  Yes Historical Provider, MD  clopidogrel (PLAVIX) 75 MG tablet Take 1 tablet (75 mg total) by mouth daily. 07/10/14  Yes Catarina Hartshorn, MD  DULoxetine (CYMBALTA) 20 MG capsule Take 20 mg by mouth 3 (three) times daily. 06/14/14  Yes Historical Provider, MD  escitalopram (LEXAPRO) 5 MG tablet Take 2.5 mg by mouth daily.   Yes Historical Provider, MD  latanoprost (XALATAN) 0.005 % ophthalmic solution Place 1 drop into both eyes at bedtime.  08/30/14  Yes Historical Provider, MD  levalbuterol Pauline Aus HFA) 45 MCG/ACT inhaler Inhale 1-2 puffs into the lungs every 6 (six) hours as needed for wheezing.   Yes Historical Provider, MD  levothyroxine (SYNTHROID, LEVOTHROID) 50 MCG tablet Take 50 mcg by mouth daily.     Yes Historical Provider, MD  loratadine (CLARITIN) 10 MG tablet Take 10 mg by mouth daily.   Yes Historical Provider, MD  metoprolol tartrate (LOPRESSOR) 25 MG tablet Take 0.5 tablets (12.5 mg total) by mouth 2 (two) times daily. Patient taking differently: Take 12.5 mg by mouth 2 (two) times daily. Can take 1 addt'l dose for systolic bp >150 0/4/54  Yes Vesta Mixer, MD  MINIVELLE 0.025 MG/24HR Place 1 patch onto the skin 2 (two) times a week. Patient not taking: Reported on 10/06/2014 07/21/14   Dara Lords, MD  nitroGLYCERIN (NITROSTAT) 0.4 MG SL tablet Place 0.4 mg under the tongue every 5 (five) minutes as needed for chest pain.   Yes Historical Provider, MD  Vitamin D, Ergocalciferol, (DRISDOL) 50000 UNITS CAPS capsule Take 50,000 Units by mouth every 7 (seven) days.   Yes Historical Provider, MD   Physical Exam: Filed Vitals:   10/06/14 1845  BP: 139/63  Pulse: 99  Temp:   Resp: 23    BP 139/63 mmHg  Pulse 99  Temp(Src) 97.4 F (36.3 C) (Axillary)  Resp 23  SpO2 93%  LMP 01/14/1970  General Appearance:    Alert, oriented, no distress, appears  stated age  Head:    Normocephalic, atraumatic  Eyes:    PERRL, EOMI, sclera non-icteric        Nose:   Nares without drainage or epistaxis. Mucosa, turbinates normal  Throat:   Moist mucous membranes. Oropharynx without erythema or exudate.  Neck:   Supple. No carotid bruits.  No thyromegaly.  No lymphadenopathy.   Back:     No CVA tenderness, no spinal tenderness  Lungs:     Clear to auscultation bilaterally, without wheezes, rhonchi or  rales  Chest wall:    No tenderness to palpitation  Heart:    Regular rate and rhythm without murmurs, gallops, rubs  Abdomen:     Soft, non-tender, nondistended, normal bowel sounds, no organomegaly  Genitalia:    deferred  Rectal:    deferred  Extremities:   No clubbing, cyanosis or edema.  Pulses:   2+ and symmetric all extremities  Skin:   Skin color, texture, turgor normal, no rashes or lesions  Lymph nodes:   Cervical, supraclavicular, and axillary nodes normal  Neurologic:   CNII-XII intact. Normal strength, sensation and reflexes      throughout    Labs on Admission:  Basic Metabolic Panel:  Recent Labs Lab 10/06/14 1608  NA 139  K 4.0  CL 105  CO2 24  GLUCOSE 147*  BUN 19  CREATININE 0.95  CALCIUM 8.2*   Liver Function Tests:  Recent Labs Lab 10/06/14 1608  AST 30  ALT 15  ALKPHOS 100  BILITOT 0.3  PROT 6.7  ALBUMIN 3.6   No results for input(s): LIPASE, AMYLASE in the last 168 hours. No results for input(s): AMMONIA in the last 168 hours. CBC:  Recent Labs Lab 10/06/14 1608  WBC 16.9*  NEUTROABS 14.9*  HGB 9.7*  HCT 32.4*  MCV 82.4  PLT 227   Cardiac Enzymes:  Recent Labs Lab 10/06/14 1608  TROPONINI <0.03    BNP (last 3 results) No results for input(s): PROBNP in the last 8760 hours. CBG:  Recent Labs Lab 10/06/14 1622  GLUCAP 137*    Radiological Exams on Admission: Dg Chest 2 View  10/06/2014   CLINICAL DATA:  81 year old who had a near syncopal episode while on the toilet earlier today,  associated with diaphoresis.  EXAM: CHEST  2 VIEW  COMPARISON:  07/08/2014 and earlier.  FINDINGS: AP erect and lateral images were obtained. Suboptimal inspiration. Cardiac silhouette mildly enlarged, unchanged. Thoracic aorta tortuous and mildly atherosclerotic, unchanged. Moderate-sized hiatal hernia containing an air-fluid level. Hilar and mediastinal contours otherwise unremarkable. Linear scarring in the right middle lobe and left upper lobe, unchanged. Nodular opacity projected over the right upper lobe on the AP image, not localized on the lateral. Mild pulmonary venous hypertension without overt edema. No confluent airspace consolidation. No pleural effusions. No pneumothorax. Thoracolumbar scoliosis convex right. Osseous demineralization.  IMPRESSION: 1. No acute cardiopulmonary disease. 2. Possible right upper lobe lung nodule visualized on the AP image, not localized on the lateral. 3. Scarring in the right middle lobe and left upper lobe. 4. Stable mild cardiomegaly. Pulmonary venous hypertension without overt edema. CT the chest may be helpful in further evaluation to confirm or deny the presence of the right upper lobe lung nodule. I note that the patient is currently scheduled for CT abdomen and pelvis and this could be added on to that examination.   Electronically Signed   By: Hulan Saas M.D.   On: 10/06/2014 16:30   Ct Head Wo Contrast  10/06/2014   CLINICAL DATA:  Per [facility], pt had near syncopal episode on the toilet, c/o abd pain, rate 2/10, diaphoretic, denies c/o AND SOB. Hx of anxiety/COPD, CVA 6/16, left facial droop.  EXAM: CT HEAD WITHOUT CONTRAST  TECHNIQUE: Contiguous axial images were obtained from the base of the skull through the vertex without intravenous contrast.  COMPARISON:  07/08/2014  FINDINGS: There is central and cortical atrophy. Periventricular white matter changes are consistent with small vessel disease. There is no intra or extra-axial fluid  collection or mass  lesion. The basilar cisterns and ventricles have a normal appearance. There is no CT evidence for acute infarction or hemorrhage.  Left mastoid effusion is noted.  Bilateral hearing aids.  IMPRESSION: 1. Atrophy and small vessel disease. 2.  No evidence for acute intracranial abnormality. 3. Small left mastoid effusion.   Electronically Signed   By: Norva Pavlov M.D.   On: 10/06/2014 17:37   Ct Abdomen Pelvis W Contrast  10/06/2014   CLINICAL DATA:  Abdominal pain and syncope today.  EXAM: CT ABDOMEN AND PELVIS WITH CONTRAST  TECHNIQUE: Multidetector CT imaging of the abdomen and pelvis was performed using the standard protocol following bolus administration of intravenous contrast.  CONTRAST:  OMNIPAQUE IOHEXOL 300 MG/ML  SOLN  COMPARISON:  None.  FINDINGS: Lower chest: The lung bases demonstrate patchy tree-in-bud appearance typically seen with inflammatory lung disease or atypical infection such as MAC. There is also a left lower lobe bronchiectasis. There is a moderate to large hiatal hernia. The heart is mildly enlarged. No pericardial effusion.  Hepatobiliary: No focal hepatic lesions are identified. And mild intra and extrahepatic biliary dilatation likely age related. The gallbladder is mildly contracted. Probable phyrigian cap.  Pancreas: No mass, inflammation or ductal dilatation.  Mild atrophy.  Spleen: Normal size.  No focal lesions.  Adrenals/Urinary Tract: The adrenal glands and kidneys are unremarkable. No renal, ureteral or bladder calculi. No renal or bladder mass.  Stomach/Bowel: The stomach, duodenum, small bowel and colon are unremarkable. No inflammatory changes, mass lesions or obstructive findings. There is a large amount of stool throughout the colon which may suggest constipation. There is mild focal wall thickening of the transverse colon suggesting mild colitis.  Vascular/Lymphatic: No mesenteric or retroperitoneal mass or adenopathy. The aorta is normal in caliber. Scattered  atherosclerotic calcifications.  Other: The uterus is surgically absent. No pelvic mass or adenopathy. The bladder appears normal. No inguinal mass or adenopathy.  Musculoskeletal: No significant bony findings. Mild scoliosis and degenerative lumbar spondylosis.  IMPRESSION: Constipation and fecal impaction in the rectum with mild transverse colitis.  No other significant abdominal/ pelvic findings.  Moderate to large hiatal hernia.   Electronically Signed   By: Rudie Meyer M.D.   On: 10/06/2014 17:48    EKG: Independently reviewed.  Assessment/Plan Principal Problem:   Colitis Active Problems:   Essential hypertension   Chronic diastolic CHF (congestive heart failure)   1. Colitis - infectious vs secondary to constipation 1. Empiric cipro/flagyl given the leukocytosis, tachycardia in ED, and subjective fevers 2. Putting patient on bowel regimen 3. Admitting for overnight obs 2. HTN - continue home meds 3. H/o stroke - continue plavix and statin    Code Status: [name]  Family Communication: Family at bedside Disposition Plan: Admit to obs   Time spent: 50 min  GARDNER, JARED M. [facility] Pager [phone]  If 7AM-7PM, please contact the day team taking care of the patient [email or web address] Password [facility] 10/06/2014, 8:02 PM

## 2014-10-06 NOTE — Progress Notes (Signed)
ANTIBIOTIC CONSULT NOTE - INITIAL  Pharmacy Consult for Ciprofloxacin Indication: Intra-abdominal infection  Allergies  Allergen Reactions  . Codeine Anaphylaxis  . Hydrocodone Nausea And Vomiting and Other (See Comments)    SYNCOPE AND BRADYCARDIA  . Isosorbide Other (See Comments)    BRADYCARDIA  . Oxycodone Palpitations    Rapid heart beat  . Clarithromycin Nausea And Vomiting  . Fiorinal [Butalbital-Aspirin-Caffeine] Swelling and Other (See Comments)    Facial swelling   . Levofloxacin Nausea And Vomiting and Other (See Comments)    SYNCOPE ALSO    Patient Measurements:   Adjusted Body Weight:   Vital Signs: Temp: 97.4 F (36.3 C) (09/22 1435) Temp Source: Axillary (09/22 1435) BP: 139/63 mmHg (09/22 1845) Pulse Rate: 99 (09/22 1845) Intake/Output from previous day:   Intake/Output from this shift:    Labs:  Recent Labs  10/06/14 1608  WBC 16.9*  HGB 9.7*  PLT 227  CREATININE 0.95   CrCl cannot be calculated (Unknown ideal weight.). No results for input(s): VANCOTROUGH, VANCOPEAK, VANCORANDOM, GENTTROUGH, GENTPEAK, GENTRANDOM, TOBRATROUGH, TOBRAPEAK, TOBRARND, AMIKACINPEAK, AMIKACINTROU, AMIKACIN in the last 72 hours.   Microbiology: No results found for this or any previous visit (from the past 720 hour(s)).  Medical History: Past Medical History  Diagnosis Date  . Bronchiectasis     with history of Legionaires with chronic rales/rhonchi  . Pollen allergies   . Hypertension     Negative renal duplex in December of 2012  . Allergic rhinitis   . Diastolic dysfunction     a. per echo 08/2010 with normal LV function;  b. 06/2011 Echo: EF 65-70%  . Idiopathic peripheral neuropathy   . Hypothyroidism   . Depression   . H/O hiatal hernia   . Osteopenia 02/2011    t score - 2.1  . Hypercholesteremia   . Chest pain at rest   . NSTEMI (non-ST elevated myocardial infarction)     a. Normal coronaries per cath August 2012 and negative CT angio for PE;   b. 06/2011 Repeat admission w/ chest pain and elevated troponin's, CTA Chest w/o PE  . Asthma   . MAC (mycobacterium avium-intracellulare complex)   . Shortness of breath     "sometimes just lying down" (04/15/2012)  . Pneumonia     "recurrent" (04/15/2012)  . H/O Legionnaire's disease 11/1976  . History of blood transfusion 1972    "w/hysterectomy" (04/15/2012)  . GERD (gastroesophageal reflux disease)     "just recently" (04/15/2012)  . Daily headache     "over the last week" 04/15/2012   . Migraines     "outgrew them" (04/15/2012)  . Seizures 1930's    "3-4 as a preteen" (04/15/2012)  . Arthritis     "a little; in my back and hands" (04/15/2012)  . Anxiety     Anxiety attacks  . Stroke 06/2014    cerebellum    Assessment: 73 yoF presents with near syncope with abdominal pain and intermittent fevers. CT pelvis shows mild transverse colitis with constipation and fecal impaction. Pt started on ciprofloxacin and flagyl given leukocytosis, tachycardia, and subjective fevers.  Pharmacy consulted to dose cipro.    Anti-infectives 9/22 >> cipro >> 9/22 >> flagyl  >>    Vitals/Labs WBC: Elevated 16.9K Tm24h: 97.4 (pt reports fevers at home) Renal: SCr 0.95, estimated CrCl ~35  Cultures None  Goal of Therapy:  Eradication of infection  Plan:  Ciprofloxacin  IV q12h Watch renal fxn closely to adjust dose  BMET in AM  Haynes Hoehn, PharmD, BCPS 10/06/2014, 8:23 PM  Pager: [phone]

## 2014-10-06 NOTE — ED Notes (Signed)
Pt placed back on monitor, c/o headache stating "it's from the stress".  Warm blanket provided.

## 2014-10-06 NOTE — ED Provider Notes (Signed)
CSN: [id number]     Arrival date & time 10/06/14  1421 History   First MD Initiated Contact with Patient 10/06/14 1459     Chief Complaint  Patient presents with  . Near Syncope  . Abdominal Pain  . Cough     (Consider location/radiation/quality/duration/timing/severity/associated sxs/prior Treatment) Patient is a 81 y.o. female presenting with near-syncope. The history is provided by the patient and a caregiver.  Near Syncope This is a new problem. The problem occurs rarely. The problem has been resolved. Associated symptoms include abdominal pain and shortness of breath. Pertinent negatives include no chest pain. Nothing aggravates the symptoms. Nothing relieves the symptoms. She has tried nothing for the symptoms.    Past Medical History  Diagnosis Date  . Bronchiectasis     with history of Legionaires with chronic rales/rhonchi  . Pollen allergies   . Hypertension     Negative renal duplex in December of 2012  . Allergic rhinitis   . Diastolic dysfunction     a. per echo 08/2010 with normal LV function;  b. 06/2011 Echo: EF 65-70%  . Idiopathic peripheral neuropathy   . Hypothyroidism   . Depression   . H/O hiatal hernia   . Osteopenia 02/2011    t score - 2.1  . Hypercholesteremia   . Chest pain at rest   . NSTEMI (non-ST elevated myocardial infarction)     a. Normal coronaries per cath August 2012 and negative CT angio for PE;  b. 06/2011 Repeat admission w/ chest pain and elevated troponin's, CTA Chest w/o PE  . Asthma   . MAC (mycobacterium avium-intracellulare complex)   . Shortness of breath     "sometimes just lying down" (04/15/2012)  . Pneumonia     "recurrent" (04/15/2012)  . H/O Legionnaire's disease 11/1976  . History of blood transfusion 1972    "w/hysterectomy" (04/15/2012)  . GERD (gastroesophageal reflux disease)     "just recently" (04/15/2012)  . Daily headache     "over the last week" 04/15/2012   . Migraines     "outgrew them" (04/15/2012)  . Seizures  1930's    "3-4 as a preteen" (04/15/2012)  . Arthritis     "a little; in my back and hands" (04/15/2012)  . Anxiety     Anxiety attacks  . Stroke 06/2014    cerebellum   Past Surgical History  Procedure Laterality Date  . Dilation and curettage of uterus    . Appendectomy    . Cardiac catheterization  August 2012    Normal coronaries.  . Tonsillectomy  1930  . Total abdominal hysterectomy w/ bilateral salpingoophorectomy  1972    leiomyomata, menorrhagia   Family History  Problem Relation Age of Onset  . Hypertension Mother     stroke  . Stroke Mother   . Cancer Brother     prostate  . Osteoarthritis Paternal Aunt    Social History  Substance Use Topics  . Smoking status: Never Smoker   . Smokeless tobacco: Never Used  . Alcohol Use: 1.2 oz/week    2 Glasses of wine per week     Comment: wine 1-2 week   OB History    Gravida Para Term Preterm AB TAB SAB Ectopic Multiple Living   2 2        2      Review of Systems  Constitutional: Positive for fever. Negative for chills.  Respiratory: Positive for cough and shortness of breath.   Cardiovascular: Positive for near-syncope.  Negative for chest pain.  Gastrointestinal: Positive for abdominal pain. Negative for nausea, diarrhea and constipation.  Endocrine: Negative for polydipsia and polyuria.  Genitourinary: Negative for urgency and hematuria.  All other systems reviewed and are negative.     Allergies  Codeine; Hydrocodone; Isosorbide; Oxycodone; Clarithromycin; Fiorinal; and Levofloxacin  Home Medications   Prior to Admission medications   Medication Sig Start Date End Date Taking? Authorizing Provider  ALPRAZolam Prudy Feeler) 0.5 MG tablet Take 0.5 mg by mouth 2 (two) times daily. CAN TAKE AN ADDT'L TABLET AS NEEDED FOR ANXIETY OR PANIC ATTACK    Historical Provider, MD  amLODipine (NORVASC) 10 MG tablet Take 1 tablet (10 mg total) by mouth daily. 09/08/14   Marvel Plan, MD  atorvastatin (LIPITOR) 10 MG tablet Take 1  tablet (10 mg total) by mouth daily at 6 PM. 07/10/14   Catarina Hartshorn, MD  cetirizine (ZYRTEC) 10 MG tablet Take 10 mg by mouth as needed for allergies.    Historical Provider, MD  clonazePAM (KLONOPIN) 0.5 MG tablet Take 0.25 mg by mouth at bedtime.  04/06/13   Historical Provider, MD  clopidogrel (PLAVIX) 75 MG tablet Take 1 tablet (75 mg total) by mouth daily. 07/10/14   Catarina Hartshorn, MD  DULoxetine (CYMBALTA) 20 MG capsule Take 20 mg by mouth 3 (three) times daily. 06/14/14   Historical Provider, MD  escitalopram (LEXAPRO) 5 MG tablet Take 2.5 mg by mouth daily.    Historical Provider, MD  latanoprost (XALATAN) 0.005 % ophthalmic solution  08/30/14   Historical Provider, MD  levalbuterol (XOPENEX HFA) 45 MCG/ACT inhaler Inhale 1-2 puffs into the lungs every 6 (six) hours as needed for wheezing.    Historical Provider, MD  levothyroxine (SYNTHROID, LEVOTHROID) 50 MCG tablet Take 50 mcg by mouth daily.      Historical Provider, MD  metoprolol tartrate (LOPRESSOR) 25 MG tablet Take 0.5 tablets (12.5 mg total) by mouth 2 (two) times daily. Patient taking differently: Take 12.5 mg by mouth 2 (two) times daily. Can take 1 addt'l dose for systolic bp >150 01/20/08   Vesta Mixer, MD  MINIVELLE 0.025 MG/24HR Place 1 patch onto the skin 2 (two) times a week. 07/21/14   Dara Lords, MD  nitroGLYCERIN (NITROSTAT) 0.4 MG SL tablet Place 0.4 mg under the tongue every 5 (five) minutes as needed for chest pain.    Historical Provider, MD   BP 122/56 mmHg  Pulse 81  Temp(Src) 97.4 F (36.3 C) (Axillary)  Resp 18  SpO2 97%  LMP 01/14/1970 Physical Exam  Constitutional: She is oriented to person, place, and time. She appears well-developed and well-nourished.  HENT:  Head: Normocephalic and atraumatic.  Eyes: Conjunctivae and EOM are normal. Right eye exhibits no discharge. Left eye exhibits no discharge.  Cardiovascular: Normal rate and regular rhythm.   Pulmonary/Chest: Effort normal. Tachypnea noted. No  respiratory distress. She has wheezes. She has rales.  Abdominal: Soft. She exhibits no distension. There is no tenderness. There is no rebound.  Musculoskeletal: Normal range of motion. She exhibits no edema or tenderness.  Neurological: She is alert and oriented to person, place, and time. A cranial nerve deficit is present.  Decreased sensation on LLE, left facial droop  Skin: Skin is warm and dry.  Nursing note and vitals reviewed.   ED Course  Procedures (including critical care time) Labs Review Labs Reviewed  URINALYSIS, ROUTINE W REFLEX MICROSCOPIC (NOT AT [facility]) - Abnormal; Notable for the following:    APPearance CLOUDY (*)  All other components within normal limits  CBC WITH DIFFERENTIAL/PLATELET - Abnormal; Notable for the following:    WBC 16.9 (*)    Hemoglobin 9.7 (*)    HCT 32.4 (*)    MCH 24.7 (*)    MCHC 29.9 (*)    RDW 17.1 (*)    Neutro Abs 14.9 (*)    All other components within normal limits  COMPREHENSIVE METABOLIC PANEL - Abnormal; Notable for the following:    Glucose, Bld 147 (*)    Calcium 8.2 (*)    GFR calc non Af Amer 51 (*)    GFR calc Af Amer 59 (*)    All other components within normal limits  CBG MONITORING, ED - Abnormal; Notable for the following:    Glucose-Capillary 137 (*)    All other components within normal limits  TROPONIN I  BASIC METABOLIC PANEL    Imaging Review Dg Chest 2 View  10/06/2014   CLINICAL DATA:  81 year old who had a near syncopal episode while on the toilet earlier today, associated with diaphoresis.  EXAM: CHEST  2 VIEW  COMPARISON:  07/08/2014 and earlier.  FINDINGS: AP erect and lateral images were obtained. Suboptimal inspiration. Cardiac silhouette mildly enlarged, unchanged. Thoracic aorta tortuous and mildly atherosclerotic, unchanged. Moderate-sized hiatal hernia containing an air-fluid level. Hilar and mediastinal contours otherwise unremarkable. Linear scarring in the right middle lobe and left upper lobe,  unchanged. Nodular opacity projected over the right upper lobe on the AP image, not localized on the lateral. Mild pulmonary venous hypertension without overt edema. No confluent airspace consolidation. No pleural effusions. No pneumothorax. Thoracolumbar scoliosis convex right. Osseous demineralization.  IMPRESSION: 1. No acute cardiopulmonary disease. 2. Possible right upper lobe lung nodule visualized on the AP image, not localized on the lateral. 3. Scarring in the right middle lobe and left upper lobe. 4. Stable mild cardiomegaly. Pulmonary venous hypertension without overt edema. CT the chest may be helpful in further evaluation to confirm or deny the presence of the right upper lobe lung nodule. I note that the patient is currently scheduled for CT abdomen and pelvis and this could be added on to that examination.   Electronically Signed   By: Hulan Saas M.D.   On: 10/06/2014 16:30   Ct Head Wo Contrast  10/06/2014   CLINICAL DATA:  Per [facility], pt had near syncopal episode on the toilet, c/o abd pain, rate 2/10, diaphoretic, denies c/o AND SOB. Hx of anxiety/COPD, CVA 6/16, left facial droop.  EXAM: CT HEAD WITHOUT CONTRAST  TECHNIQUE: Contiguous axial images were obtained from the base of the skull through the vertex without intravenous contrast.  COMPARISON:  07/08/2014  FINDINGS: There is central and cortical atrophy. Periventricular white matter changes are consistent with small vessel disease. There is no intra or extra-axial fluid collection or mass lesion. The basilar cisterns and ventricles have a normal appearance. There is no CT evidence for acute infarction or hemorrhage.  Left mastoid effusion is noted.  Bilateral hearing aids.  IMPRESSION: 1. Atrophy and small vessel disease. 2.  No evidence for acute intracranial abnormality. 3. Small left mastoid effusion.   Electronically Signed   By: Norva Pavlov M.D.   On: 10/06/2014 17:37   Ct Abdomen Pelvis W Contrast  10/06/2014   CLINICAL  DATA:  Abdominal pain and syncope today.  EXAM: CT ABDOMEN AND PELVIS WITH CONTRAST  TECHNIQUE: Multidetector CT imaging of the abdomen and pelvis was performed using the standard protocol following bolus administration  of intravenous contrast.  CONTRAST:  OMNIPAQUE IOHEXOL 300 MG/ML  SOLN  COMPARISON:  None.  FINDINGS: Lower chest: The lung bases demonstrate patchy tree-in-bud appearance typically seen with inflammatory lung disease or atypical infection such as MAC. There is also a left lower lobe bronchiectasis. There is a moderate to large hiatal hernia. The heart is mildly enlarged. No pericardial effusion.  Hepatobiliary: No focal hepatic lesions are identified. And mild intra and extrahepatic biliary dilatation likely age related. The gallbladder is mildly contracted. Probable phyrigian cap.  Pancreas: No mass, inflammation or ductal dilatation.  Mild atrophy.  Spleen: Normal size.  No focal lesions.  Adrenals/Urinary Tract: The adrenal glands and kidneys are unremarkable. No renal, ureteral or bladder calculi. No renal or bladder mass.  Stomach/Bowel: The stomach, duodenum, small bowel and colon are unremarkable. No inflammatory changes, mass lesions or obstructive findings. There is a large amount of stool throughout the colon which may suggest constipation. There is mild focal wall thickening of the transverse colon suggesting mild colitis.  Vascular/Lymphatic: No mesenteric or retroperitoneal mass or adenopathy. The aorta is normal in caliber. Scattered atherosclerotic calcifications.  Other: The uterus is surgically absent. No pelvic mass or adenopathy. The bladder appears normal. No inguinal mass or adenopathy.  Musculoskeletal: No significant bony findings. Mild scoliosis and degenerative lumbar spondylosis.  IMPRESSION: Constipation and fecal impaction in the rectum with mild transverse colitis.  No other significant abdominal/ pelvic findings.  Moderate to large hiatal hernia.   Electronically  Signed   By: Rudie Meyer M.D.   On: 10/06/2014 17:48   I have personally reviewed and evaluated these images and lab results as part of my medical decision-making.   EKG Interpretation   Date/Time:  Thursday October 06 2014 14:36:36 EDT Ventricular Rate:  76 PR Interval:  155 QRS Duration: 94 QT Interval:  424 QTC Calculation: 477 R Axis:   9 Text Interpretation:  Sinus rhythm Confirmed by Tarsha Blando MD, Barbara Cower [phone])  on 10/06/2014 3:30:56 PM      MDM   Final diagnoses:  Colitis  Leukocytosis  Near syncope  Tachycardia   81 yo F w/ residual left sided symptoms here with near syncope surrounding an episode of abdominal pain. Apparently diaphoretic, pale and clammy.  Here appears well, with likely baseline lung findings. Will workup appropriately. Likely 2/2 significantly decreased BP with recent medication change.  Patient found to have colitis, leukocytosis. Orthostatic changes as well. Tachypnea and weakness when walking. Doubt she would do well at home. Will admit to obs w/ abx.     Marily Memos, MD 10/06/14 [phone]

## 2014-10-06 NOTE — ED Notes (Signed)
Bed: WA17 Expected date:  Expected time:  Means of arrival:  Comments: 81yo near syncope

## 2014-10-06 NOTE — ED Notes (Signed)
Per [facility], pt had near syncopal episode on the toilet, c/o abd pain, rate 2/10, diaphoretic, denies c/o & SOB.  Hx of anxiety/COPD, CVA 6/16, left facial droop, had a dental appt. Today but was given no meds.

## 2014-10-06 NOTE — ED Notes (Signed)
Pt remains on monitor.  Daughter & son-in-law @ [facility].

## 2014-10-07 ENCOUNTER — Encounter (HOSPITAL_COMMUNITY): Payer: Self-pay | Admitting: Nurse Practitioner

## 2014-10-07 DIAGNOSIS — K529 Noninfective gastroenteritis and colitis, unspecified: Principal | ICD-10-CM

## 2014-10-07 DIAGNOSIS — I10 Essential (primary) hypertension: Secondary | ICD-10-CM

## 2014-10-07 DIAGNOSIS — K5641 Fecal impaction: Secondary | ICD-10-CM | POA: Diagnosis present

## 2014-10-07 LAB — BASIC METABOLIC PANEL
Anion gap: 7 (ref 5–15)
BUN: 12 mg/dL (ref 6–20)
CHLORIDE: 104 mmol/L (ref 101–111)
CO2: 26 mmol/L (ref 22–32)
Calcium: 8.3 mg/dL — ABNORMAL LOW (ref 8.9–10.3)
Creatinine, Ser: 0.78 mg/dL (ref 0.44–1.00)
GFR calc Af Amer: >60 mL/min (ref >60)
GLUCOSE: 101 mg/dL — AB (ref 65–99)
POTASSIUM: 4 mmol/L (ref 3.5–5.1)
Sodium: 137 mmol/L (ref 135–145)

## 2014-10-07 LAB — MRSA PCR SCREENING: MRSA by PCR: NEGATIVE

## 2014-10-07 MED ORDER — ACETAMINOPHEN 325 MG PO TABS
650.0000 mg | ORAL_TABLET | Freq: Four times a day (QID) | ORAL | Status: DC | PRN
  Administered 2014-10-07 – 2014-10-08 (×2): 650 mg via ORAL
  Filled 2014-10-07 (×2): qty 2

## 2014-10-07 MED ORDER — ACETAMINOPHEN 325 MG PO TABS
650.0000 mg | ORAL_TABLET | Freq: Once | ORAL | Status: AC
  Administered 2014-10-07: 650 mg via ORAL
  Filled 2014-10-07: qty 2

## 2014-10-07 MED ORDER — MAGNESIUM HYDROXIDE 400 MG/5ML PO SUSP
15.0000 mL | Freq: Every day | ORAL | Status: DC
  Administered 2014-10-07: 15 mL via ORAL
  Filled 2014-10-07: qty 30

## 2014-10-07 MED ORDER — INFLUENZA VAC SPLIT QUAD 0.5 ML IM SUSY
0.5000 mL | PREFILLED_SYRINGE | INTRAMUSCULAR | Status: DC
  Filled 2014-10-07 (×2): qty 0.5

## 2014-10-07 MED ORDER — SORBITOL 70 % SOLN
960.0000 mL | TOPICAL_OIL | Freq: Once | ORAL | Status: AC
  Administered 2014-10-07: 960 mL via RECTAL
  Filled 2014-10-07: qty 240

## 2014-10-07 NOTE — Evaluation (Signed)
Physical Therapy Evaluation Patient Details Name: Sarah [name] MRN: [id number] DOB: 09/02/1921 Today's Date: 10/07/2014   History of Present Illness  81 y.o. female admitted from ALF and had near-syncope episode while on toilet. PMHx: CVA on 6/16 with residual L facial droop, anxiety, NSTEMI, Idiopathic peripheral neuropathy.  Pt also reports recent adjusting of BP meds.  Clinical Impression  Pt admitted with above diagnosis. Pt currently with functional limitations due to the deficits listed below (see PT Problem List).  Pt will benefit from skilled PT to increase their independence and safety with mobility to allow discharge to the venue listed below.   Pt reports generalized weakness as main complaint today and feels a little better since admission.  Pt reports her facility should be able to assist her as needed once she returns.     Follow Up Recommendations Home health PT;Supervision - Intermittent    Equipment Recommendations  None recommended by PT    Recommendations for Other Services       Precautions / Restrictions Precautions Precautions: Fall      Mobility  Bed Mobility Overal bed mobility: Modified Independent                Transfers Overall transfer level: Needs assistance Equipment used: None Transfers: Sit to/from Stand Sit to Stand: Min assist         General transfer comment: assist to steady with rise, verbal cues for backing up to recliner, performed better from recliner using armrests  Ambulation/Gait Ambulation/Gait assistance: Min guard Ambulation Distance (Feet): 120 Feet Assistive device: Rolling walker (2 wheeled) Gait Pattern/deviations: Step-through pattern;Trunk flexed     General Gait Details: verbal cues for safe use of RW particularly remaining within RW (however used to rollator and cane per pt), HR WNL and SPO2 92-94% on room air, denies dizziness  Stairs            Wheelchair Mobility    Modified Rankin (Stroke  Patients Only)       Balance Overall balance assessment: History of Falls (reports a few falls just after recent CVA)                                           Pertinent Vitals/Pain Pain Assessment: No/denies pain    Home Living Family/patient expects to be discharged to:: Assisted living               Home Equipment: Cane - quad;Shower seat - built in;Walker - 4 wheels (rollator)      Prior Function Level of Independence: Independent with assistive device(s)         Comments: Facility provides meals, medication management, housekeeping, laundry.  Patient has assistance on Tuesdays/Thursdays for driving (errands, etc.).     Hand Dominance        Extremity/Trunk Assessment               Lower Extremity Assessment: Generalized weakness         Communication   Communication: Expressive difficulties (slight dysarthria)  Cognition Arousal/Alertness: Awake/alert Behavior During Therapy: WFL for tasks assessed/performed Overall Cognitive Status: Within Functional Limits for tasks assessed                      General Comments      Exercises        Assessment/Plan    PT Assessment Patient needs  continued PT services  PT Diagnosis Difficulty walking;Generalized weakness   PT Problem List Decreased strength;Decreased activity tolerance;Decreased mobility;Decreased balance;Decreased knowledge of use of DME  PT Treatment Interventions DME instruction;Gait training;Functional mobility training;Patient/family education;Therapeutic activities;Therapeutic exercise;Balance training   PT Goals (Current goals can be found in the Care Plan section) Acute Rehab PT Goals PT Goal Formulation: With patient Time For Goal Achievement: 10/14/14 Potential to Achieve Goals: Good    Frequency Min 3X/week   Barriers to discharge        Co-evaluation               End of Session Equipment Utilized During Treatment: Gait  belt Activity Tolerance: Patient tolerated treatment well Patient left: with call bell/phone within reach;in chair;with chair alarm set           Time: 1610-9604 PT Time Calculation (min) (ACUTE ONLY): 15 min   Charges:   PT Evaluation $Initial PT Evaluation Tier I: 1 Procedure     PT G Codes:        LEMYRE,KATHrine E 10/07/2014, 12:51 PM Zenovia Jarred, PT, DPT 10/07/2014 Pager: [phone]

## 2014-10-07 NOTE — Progress Notes (Signed)
PROGRESS NOTE  Sarah [name] YNW:[id number] DOB: 11-25-1921 DOA: 10/06/2014 PCP: Emeterio Reeve, MD  HPI: Sarah [name] is a 81 y.o. female presents to the ED with near-syncope today while on toilet. She has had abdominal pain, 2/10 in severity for the past several days. Finally had 2x BMs after arriving to the ED. Has a h/o CVA on 6/16 with residual L facial droop. No N/V/D. Has been having intermittent fevers over past couple of days.  Subjective / 24 H Interval events - She still has some abdominal discomfort, had 2 small bowel movements which feels that there were not enough - She feels like her energy is not back to normal  Assessment/Plan: Principal Problem:   Colitis Active Problems:   Essential hypertension   Chronic diastolic CHF (congestive heart failure)   Fecal impaction   Colitis  - infectious vs secondary to constipation - Empiric cipro/flagyl given the leukocytosis, tachycardia in ED, and subjective fevers Fecal impaction / severe constipation - Patient received SMOG enema without much success, repeat - Continue bowel regimens HTN - continue home meds H/o stroke - continue plavix and statin   Diet: Diet Heart Room service appropriate?: Yes; Fluid consistency:: Thin Fluids: none  DVT Prophylaxis: heparin  Code Status: Full Code Family Communication: d/w daughters bedside Disposition Plan: ALF in 1 day  Consultants:  None   Procedures:  None    Antibiotics  Anti-infectives    Start     Dose/Rate Route Frequency Ordered Stop   10/07/14 0800  ciprofloxacin (CIPRO) IVPB 400 mg     400 mg 200 mL/hr over 60 Minutes Intravenous Every 12 hours 10/06/14 2024     10/07/14 0400  metroNIDAZOLE (FLAGYL) IVPB 500 mg     500 mg 100 mL/hr over 60 Minutes Intravenous Every 8 hours 10/06/14 1954     10/06/14 1830  metroNIDAZOLE (FLAGYL) tablet 500 mg     500 mg Oral  Once 10/06/14 1823 10/06/14 1855   10/06/14 1830  ciprofloxacin (CIPRO) tablet 500 mg      500 mg Oral  Once 10/06/14 1823 10/06/14 1904       Studies  Dg Chest 2 View  10/06/2014   CLINICAL DATA:  81 year old who had a near syncopal episode while on the toilet earlier today, associated with diaphoresis.  EXAM: CHEST  2 VIEW  COMPARISON:  07/08/2014 and earlier.  FINDINGS: AP erect and lateral images were obtained. Suboptimal inspiration. Cardiac silhouette mildly enlarged, unchanged. Thoracic aorta tortuous and mildly atherosclerotic, unchanged. Moderate-sized hiatal hernia containing an air-fluid level. Hilar and mediastinal contours otherwise unremarkable. Linear scarring in the right middle lobe and left upper lobe, unchanged. Nodular opacity projected over the right upper lobe on the AP image, not localized on the lateral. Mild pulmonary venous hypertension without overt edema. No confluent airspace consolidation. No pleural effusions. No pneumothorax. Thoracolumbar scoliosis convex right. Osseous demineralization.  IMPRESSION: 1. No acute cardiopulmonary disease. 2. Possible right upper lobe lung nodule visualized on the AP image, not localized on the lateral. 3. Scarring in the right middle lobe and left upper lobe. 4. Stable mild cardiomegaly. Pulmonary venous hypertension without overt edema. CT the chest may be helpful in further evaluation to confirm or deny the presence of the right upper lobe lung nodule. I note that the patient is currently scheduled for CT abdomen and pelvis and this could be added on to that examination.   Electronically Signed   By: Hulan Saas M.D.   On: 10/06/2014 16:30  Ct Head Wo Contrast  10/06/2014   CLINICAL DATA:  Per [facility], pt had near syncopal episode on the toilet, c/o abd pain, rate 2/10, diaphoretic, denies c/o AND SOB. Hx of anxiety/COPD, CVA 6/16, left facial droop.  EXAM: CT HEAD WITHOUT CONTRAST  TECHNIQUE: Contiguous axial images were obtained from the base of the skull through the vertex without intravenous contrast.  COMPARISON:   07/08/2014  FINDINGS: There is central and cortical atrophy. Periventricular white matter changes are consistent with small vessel disease. There is no intra or extra-axial fluid collection or mass lesion. The basilar cisterns and ventricles have a normal appearance. There is no CT evidence for acute infarction or hemorrhage.  Left mastoid effusion is noted.  Bilateral hearing aids.  IMPRESSION: 1. Atrophy and small vessel disease. 2.  No evidence for acute intracranial abnormality. 3. Small left mastoid effusion.   Electronically Signed   By: Norva Pavlov M.D.   On: 10/06/2014 17:37   Ct Abdomen Pelvis W Contrast  10/06/2014   CLINICAL DATA:  Abdominal pain and syncope today.  EXAM: CT ABDOMEN AND PELVIS WITH CONTRAST  TECHNIQUE: Multidetector CT imaging of the abdomen and pelvis was performed using the standard protocol following bolus administration of intravenous contrast.  CONTRAST:  OMNIPAQUE IOHEXOL 300 MG/ML  SOLN  COMPARISON:  None.  FINDINGS: Lower chest: The lung bases demonstrate patchy tree-in-bud appearance typically seen with inflammatory lung disease or atypical infection such as MAC. There is also a left lower lobe bronchiectasis. There is a moderate to large hiatal hernia. The heart is mildly enlarged. No pericardial effusion.  Hepatobiliary: No focal hepatic lesions are identified. And mild intra and extrahepatic biliary dilatation likely age related. The gallbladder is mildly contracted. Probable phyrigian cap.  Pancreas: No mass, inflammation or ductal dilatation.  Mild atrophy.  Spleen: Normal size.  No focal lesions.  Adrenals/Urinary Tract: The adrenal glands and kidneys are unremarkable. No renal, ureteral or bladder calculi. No renal or bladder mass.  Stomach/Bowel: The stomach, duodenum, small bowel and colon are unremarkable. No inflammatory changes, mass lesions or obstructive findings. There is a large amount of stool throughout the colon which may suggest constipation.  There is mild focal wall thickening of the transverse colon suggesting mild colitis.  Vascular/Lymphatic: No mesenteric or retroperitoneal mass or adenopathy. The aorta is normal in caliber. Scattered atherosclerotic calcifications.  Other: The uterus is surgically absent. No pelvic mass or adenopathy. The bladder appears normal. No inguinal mass or adenopathy.  Musculoskeletal: No significant bony findings. Mild scoliosis and degenerative lumbar spondylosis.  IMPRESSION: Constipation and fecal impaction in the rectum with mild transverse colitis.  No other significant abdominal/ pelvic findings.  Moderate to large hiatal hernia.   Electronically Signed   By: Rudie Meyer M.D.   On: 10/06/2014 17:48    Objective  Filed Vitals:   10/07/14 0915 10/07/14 0925 10/07/14 1100 10/07/14 1415  BP: 101/60 134/64  109/49  Pulse: 86   82  Temp:    97.8 F (36.6 C)  TempSrc:    Oral  Resp:    20  Height:      Weight:   59.512 kg (131 lb 3.2 oz)   SpO2:    95%    Intake/Output Summary (Last 24 hours) at 10/07/14 1612 Last data filed at 10/07/14 0800  Gross per 24 hour  Intake    720 ml  Output      0 ml  Net    720 ml  Filed Weights   10/06/14 2209 10/07/14 1100  Weight: 59.9 kg (132 lb 0.9 oz) 59.512 kg (131 lb 3.2 oz)    Exam:  GENERAL: NAD  HEENT: head NCAT, no scleral icterus.   NECK: Supple.   LUNGS: Clear to auscultation. No wheezing or crackles  HEART: Regular rate and rhythm without murmur. 2+ pulses, no JVD, no peripheral edema  ABDOMEN: Soft, nontender, and nondistended. Positive bowel sounds.   EXTREMITIES: Without any cyanosis.  NEUROLOGIC: Alert and oriented x3. Cranial nerves II through XII are grossly intact. Strength 5/5 in all 4.  PSYCHIATRIC: Normal mood and affect  Data Reviewed: Basic Metabolic Panel:  Recent Labs Lab 10/06/14 1608 10/07/14 0526  NA 139 137  K 4.0 4.0  CL 105 104  CO2 24 26  GLUCOSE 147* 101*  BUN 19 12  CREATININE 0.95 0.78    CALCIUM 8.2* 8.3*   Liver Function Tests:  Recent Labs Lab 10/06/14 1608  AST 30  ALT 15  ALKPHOS 100  BILITOT 0.3  PROT 6.7  ALBUMIN 3.6   CBC:  Recent Labs Lab 10/06/14 1608  WBC 16.9*  NEUTROABS 14.9*  HGB 9.7*  HCT 32.4*  MCV 82.4  PLT 227   Cardiac Enzymes:  Recent Labs Lab 10/06/14 1608  TROPONINI <0.03   CBG:  Recent Labs Lab 10/06/14 1622  GLUCAP 137*    Recent Results (from the past 240 hour(s))  MRSA PCR Screening     Status: None   Collection Time: 10/07/14  5:01 AM  Result Value Ref Range Status   MRSA by PCR NEGATIVE NEGATIVE Final    Comment:        The GeneXpert MRSA Assay (FDA approved for NASAL specimens only), is one component of a comprehensive MRSA colonization surveillance program. It is not intended to diagnose MRSA infection nor to guide or monitor treatment for MRSA infections.      Scheduled Meds: . ALPRAZolam  0.5 mg Oral BID  . amLODipine  10 mg Oral Daily  . atorvastatin  10 mg Oral q1800  . ciprofloxacin  400 mg Intravenous Q12H  . clonazePAM  0.25 mg Oral QHS  . clopidogrel  75 mg Oral Daily  . DULoxetine  20 mg Oral TID  . escitalopram  2.5 mg Oral Daily  . heparin  5,000 Units Subcutaneous 3 times per day  . [START ON 10/08/2014] Influenza vac split quadrivalent PF  0.5 mL Intramuscular Tomorrow-1000  . latanoprost  1 drop Both Eyes QHS  . levothyroxine  50 mcg Oral QAC breakfast  . loratadine  10 mg Oral Daily  . magnesium hydroxide  15 mL Oral QHS  . metoprolol tartrate  12.5 mg Oral BID  . metronidazole  500 mg Intravenous Q8H   Continuous Infusions:   Pamella Pert, MD [facility] Pager [phone]. If 7 PM - 7 AM, please contact night-coverage at [email or web address], password [facility] 10/07/2014, 4:12 PM  LOS: 1 day

## 2014-10-07 NOTE — Progress Notes (Signed)
SSE given.no results.checked for impaction(-)

## 2014-10-07 NOTE — Progress Notes (Signed)
Tolerated most of SMOG enema w/ small results.Dtr at bedside

## 2014-10-07 NOTE — Care Management Note (Signed)
Case Management Note  Patient Details  Name: Sarah [name] MRN: [id number] Date of Birth: 1921-02-18  Subjective/Objective:   Spoke to [facility] rep-they provide their own HHPT.                 Action/Plan:d/c back to ALF w/HHPT.   Expected Discharge Date:                 Expected Discharge Plan:  Assisted Living / Rest Home  In-House Referral:  Clinical Social Work  Discharge planning Services  CM Consult  Post Acute Care Choice:    Choice offered to:     DME Arranged:    DME Agency:     HH Arranged:  PT HH Agency:     Status of Service:  In process, will continue to follow  Medicare Important Message Given:    Date Medicare IM Given:    Medicare IM give by:    Date Additional Medicare IM Given:    Additional Medicare Important Message give by:     If discussed at Long Length of Stay Meetings, dates discussed:    Additional Comments:  Lanier Clam, RN 10/07/2014, 4:30 PM

## 2014-10-07 NOTE — Clinical Social Work Note (Signed)
Clinical Social Work Assessment  Patient Details  Name: Sarah [name] MRN: [id number] Date of Birth: 1921-07-22  Date of referral:  10/07/14               Reason for consult:  Facility Placement                Permission sought to share information with:  Facility Industrial/product designer granted to share information::  Yes, Verbal Permission Granted  Name::        Agency::     Relationship::     Contact Information:     Housing/Transportation Living arrangements for the past 2 months:  Assisted Living Facility Source of Information:  Patient, Adult Children Patient Interpreter Needed:  None Criminal Activity/Legal Involvement Pertinent to Current Situation/Hospitalization:  No - Comment as needed Significant Relationships:  Adult Children Lives with:  Facility Resident Do you feel safe going back to the place where you live?  Yes Need for family participation in patient care:  Yes (Comment)  Care giving concerns:  CSW received consult that patient was admitted from [facility].    Social Worker assessment / plan:  CSW confirmed with patient & daughter, Sharyl Nimrod at bedside that patient plans to return to [facility] at discharge - anticipating possible discharge tomorrow, 9/24.   Employment status:  Retired Health and safety inspector:  Medicare PT Recommendations:  Home with Home Health Information / Referral to community resources:     Patient/Family's Response to care:  CSW awaiting call back from [place] at [facility] to confirm that they would be able to take patient back over weekend. Patient appears to be at her baseline - per med tech at [facility] patient was able to get up and dress herself and used a cane or rolling walker.   Patient/Family's Understanding of and Emotional Response to Diagnosis, Current Treatment, and Prognosis: Patient's daughter was concerned about her BM's.   Emotional Assessment Appearance:  Appears stated  age Attitude/Demeanor/Rapport:    Affect (typically observed):  Calm, Pleasant, Quiet Orientation:  Oriented to Self, Oriented to Place, Oriented to  Time, Oriented to Situation Alcohol / Substance use:    Psych involvement (Current and /or in the community):     Discharge Needs  Concerns to be addressed:    Readmission within the last 30 days:    Current discharge risk:    Barriers to Discharge:      Arlyss Repress, LCSW 10/07/2014, 3:21 PM

## 2014-10-07 NOTE — Care Management Note (Signed)
Case Management Note  Patient Details  Name: Natonya Finstad MRN: [id number] Date of Birth: May 19, 1921  Subjective/Objective: 81 y/o f admitted w/colitis. From ALF. PT cons-await recommendations.                   Action/Plan:Monitor progress for d/c plans.  Expected Discharge Date:                 Expected Discharge Plan:  Assisted Living / Rest Home  In-House Referral:  Clinical Social Work  Discharge planning Services  CM Consult  Post Acute Care Choice:    Choice offered to:     DME Arranged:    DME Agency:     HH Arranged:    HH Agency:     Status of Service:  In process, will continue to follow  Medicare Important Message Given:    Date Medicare IM Given:    Medicare IM give by:    Date Additional Medicare IM Given:    Additional Medicare Important Message give by:     If discussed at Long Length of Stay Meetings, dates discussed:    Additional Comments:  Lanier Clam, RN 10/07/2014, 12:15 PM

## 2014-10-08 DIAGNOSIS — I5032 Chronic diastolic (congestive) heart failure: Secondary | ICD-10-CM

## 2014-10-08 MED ORDER — METRONIDAZOLE 500 MG PO TABS: 500.0000 mg | ORAL_TABLET | Freq: Three times a day (TID) | ORAL | Status: DC

## 2014-10-08 MED ORDER — CIPROFLOXACIN HCL 250 MG PO TABS
250.0000 mg | ORAL_TABLET | Freq: Two times a day (BID) | ORAL | Status: DC
  Administered 2014-10-08: 250 mg via ORAL
  Filled 2014-10-08: qty 1

## 2014-10-08 MED ORDER — CIPROFLOXACIN HCL 500 MG PO TABS: 500.0000 mg | ORAL_TABLET | Freq: Two times a day (BID) | ORAL | Status: DC

## 2014-10-08 MED ORDER — ALPRAZOLAM 0.5 MG PO TABS: 0.5000 mg | ORAL_TABLET | Freq: Two times a day (BID) | ORAL | Status: DC

## 2014-10-08 MED ORDER — NITROGLYCERIN 0.4 MG SL SUBL: 0.4000 mg | SUBLINGUAL_TABLET | SUBLINGUAL | Status: DC | PRN

## 2014-10-08 MED ORDER — LACTULOSE 10 GM/15ML PO SOLN: 10.0000 g | Freq: Two times a day (BID) | ORAL | Status: DC | PRN

## 2014-10-08 MED ORDER — CIPROFLOXACIN HCL 500 MG PO TABS: 500.0000 mg | ORAL_TABLET | Freq: Once | ORAL | Status: DC

## 2014-10-08 NOTE — Clinical Social Work Note (Signed)
CSW reviewed discharge summary, updated FL2 and provided both to ALF along with a phone call to coordinate pt discharge back to ALF.   CSW coordinated discharge with facility and RN.  CSW met with pt's daughter at bedside to provide information.  CSW prepared packet and provided it to RN   .Dede Query, LCSW [facility] Clinical Social Worker - Weekend Coverage cell #: [phone]

## 2014-10-08 NOTE — Discharge Instructions (Addendum)
Follow with Emeterio Reeve, MD as scheduled  For constipation you can take Miralax 2-3 times daily  Senokot S (docusate and senna) once daily at bedtime Glycerin suppositories Adequate hydration and diet rich in fruits and vegetables, prune juice daily  If these measures are unable to relieve your constipation, you can try Lactulose for 1-2 days   Please get a complete blood count and chemistry panel checked by your Primary MD at your next visit, and again as instructed by your Primary MD. Please get your medications reviewed and adjusted by your Primary MD.  Please request your Primary MD to go over all Hospital Tests and Procedure/Radiological results at the follow up, please get all Hospital records sent to your Prim MD by signing hospital release before you go home.  If you had Pneumonia of Lung problems at the Hospital: Please get a 2 view Chest X ray done in 6-8 weeks after hospital discharge or sooner if instructed by your Primary MD.  If you have Congestive Heart Failure: Please call your Cardiologist or Primary MD anytime you have any of the following symptoms:  1) 3 pound weight gain in 24 hours or 5 pounds in 1 week  2) shortness of breath, with or without a dry hacking cough  3) swelling in the hands, feet or stomach  4) if you have to sleep on extra pillows at night in order to breathe  Follow cardiac low salt diet and 1.5 lit/day fluid restriction.  If you have diabetes Accuchecks 4 times/day, Once in AM empty stomach and then before each meal. Log in all results and show them to your primary doctor at your next visit. If any glucose reading is under 80 or above 300 call your primary MD immediately.  If you have Seizure/Convulsions/Epilepsy: Please do not drive, operate heavy machinery, participate in activities at heights or participate in high speed sports until you have seen by Primary MD or a Neurologist and advised to do so again.  If you had Gastrointestinal  Bleeding: Please ask your Primary MD to check a complete blood count within one week of discharge or at your next visit. Your endoscopic/colonoscopic biopsies that are pending at the time of discharge, will also need to followed by your Primary MD.  Get Medicines reviewed and adjusted. Please take all your medications with you for your next visit with your Primary MD  Please request your Primary MD to go over all hospital tests and procedure/radiological results at the follow up, please ask your Primary MD to get all Hospital records sent to his/her office.  If you experience worsening of your admission symptoms, develop shortness of breath, life threatening emergency, suicidal or homicidal thoughts you must seek medical attention immediately by calling 911 or calling your MD immediately  if symptoms less severe.  You must read complete instructions/literature along with all the possible adverse reactions/side effects for all the Medicines you take and that have been prescribed to you. Take any new Medicines after you have completely understood and accpet all the possible adverse reactions/side effects.   Do not drive or operate heavy machinery when taking Pain medications.   Do not take more than prescribed Pain, Sleep and Anxiety Medications  Special Instructions: If you have smoked or chewed Tobacco  in the last 2 yrs please stop smoking, stop any regular Alcohol  and or any Recreational drug use.  Wear Seat belts while driving.  Please note You were cared for by a hospitalist during your hospital  stay. If you have any questions about your discharge medications or the care you received while you were in the hospital after you are discharged, you can call the unit and asked to speak with the hospitalist on call if the hospitalist that took care of you is not available. Once you are discharged, your primary care physician will handle any further medical issues. Please note that NO REFILLS for  any discharge medications will be authorized once you are discharged, as it is imperative that you return to your primary care physician (or establish a relationship with a primary care physician if you do not have one) for your aftercare needs so that they can reassess your need for medications and monitor your lab values.  You can reach the hospitalist office at phone [phone] or fax [phone]   If you do not have a primary care physician, you can call [phone] for a physician referral.  Activity: As tolerated with Full fall precautions use walker/cane & assistance as needed  Diet: regular  Disposition Home      Constipation  Constipation is when a person has fewer than three bowel movements a week, has difficulty having a bowel movement, or has stools that are dry, hard, or larger than normal. As people grow older, constipation is more common. If you try to fix constipation with medicines that make you have a bowel movement (laxatives), the problem may get worse. Long-term laxative use may cause the muscles of the colon to become weak. A low-fiber diet, not taking in enough fluids, and taking certain medicines may make constipation worse.  CAUSES  Certain medicines, such as antidepressants, pain medicine, iron supplements, antacids, and water pills.  Certain diseases, such as diabetes, irritable bowel syndrome (IBS), thyroid disease, or depression.  Not drinking enough water.  Not eating enough fiber-rich foods.  Stress or travel.  Lack of physical activity or exercise.  Ignoring the urge to have a bowel movement.  Using laxatives too much.  SIGNS AND SYMPTOMS  Having fewer than three bowel movements a week.  Straining to have a bowel movement.  Having stools that are hard, dry, or larger than normal.  Feeling full or bloated.  Pain in the lower abdomen.  Not feeling relief after having a bowel movement.  DIAGNOSIS  Your health care provider will take a medical history  and perform a physical exam. Further testing may be done for severe constipation. Some tests may include:  A barium enema X-ray to examine your rectum, colon, and, sometimes, your small intestine.  A sigmoidoscopy to examine your lower colon.  A colonoscopy to examine your entire colon. TREATMENT  Treatment will depend on the severity of your constipation and what is causing it. Some dietary treatments include drinking more fluids and eating more fiber-rich foods. Lifestyle treatments may include regular exercise. If these diet and lifestyle recommendations do not help, your health care provider may recommend taking over-the-counter laxative medicines to help you have bowel movements. Prescription medicines may be prescribed if over-the-counter medicines do not work.  HOME CARE INSTRUCTIONS  Eat foods that have a lot of fiber, such as fruits, vegetables, whole grains, and beans.  Limit foods high in fat and processed sugars, such as french fries, hamburgers, cookies, candies, and soda.  A fiber supplement may be added to your diet if you cannot get enough fiber from foods.  Drink enough fluids to keep your urine clear or pale yellow.  Exercise regularly or as directed by your health care  provider.  Go to the restroom when you have the urge to go. Do not hold it.  Only take over-the-counter or prescription medicines as directed by your health care provider. Do not take other medicines for constipation without talking to your health care provider first.  SEEK IMMEDIATE MEDICAL CARE IF:  You have bright red blood in your stool.  Your constipation lasts for more than 4 days or gets worse.  You have abdominal or rectal pain.  You have thin, pencil-like stools.  You have unexplained weight loss. MAKE SURE YOU:  Understand these instructions.  Will watch your condition.  Will get help right away if you are not doing well or get worse. Document Released: 09/29/2003 Document Revised: 01/05/2013  Document Reviewed: 10/12/2012  [facility] Patient Information 2015 [place], [place]. This information is not intended to replace advice given to you by your health care provider. Make sure you discuss any questions you have with your health care provider.

## 2014-10-08 NOTE — Discharge Summary (Addendum)
Physician Discharge Summary  Jaci Desanto ZOX:[id number] DOB: 1921-02-27 DOA: 10/06/2014  PCP: Emeterio Reeve, MD  Admit date: 10/06/2014 Discharge date: 10/08/2014  Time spent: < 30 minutes  Recommendations for Outpatient Follow-up:  1. Follow up with Dr. Paulino Rily as scheduled  Discharge Diagnoses:  Principal Problem:   Colitis Active Problems:   Essential hypertension   Chronic diastolic CHF (congestive heart failure)   Fecal impaction  Discharge Condition: stable  Diet recommendation: regular  Filed Weights   10/06/14 2209 10/07/14 1100 10/08/14 0640  Weight: 59.9 kg (132 lb 0.9 oz) 59.512 kg (131 lb 3.2 oz) 60.4 kg (133 lb 2.5 oz)   History of present illness:  Nasim Garofano is a 81 y.o. female presents to the ED with near-syncope today while on toilet. She has had abdominal pain, 2/10 in severity for the past several days. Finally had 2x BMs after arriving to the ED. Has a h/o CVA on 6/16 with residual L facial droop. No N/V/D. Has been having intermittent fevers over past couple of days.  Hospital Course:  Patient was admitted to the hospital with severe constipation and possible colitis based on the CT scan of abdomen and pelvis. She was empirically placed on Ciprofloxacin and Metronidazole and her bowel regimen was intensified with some success. She was able to have BMs and was feeling clinically much improved. She was afebrile here however had low grade temp at home, and with initial leukocytosis in the ED I will continue her antibiotics for 3 additional days after discharge. Her TSH was recently checked per family and was normal, and was not repeated here. For her other medical conditions no medication changes were made to her home regimen. Patient and family were instructed for steps to be taken should she become constipated again, including use of OTC medications, prune juice, adequate hydration, suppositories and / or lactulose.   Procedures:  None     Consultations:  None   Discharge Exam: Filed Vitals:   10/07/14 1415 10/07/14 1651 10/07/14 1901 10/08/14 0640  BP: 109/49  114/57 100/47  Pulse: 82  86 85  Temp: 97.8 F (36.6 C)  97.8 F (36.6 C) 98.6 F (37 C)  TempSrc: Oral  Oral Oral  Resp: Height:      Weight:    60.4 kg (133 lb 2.5 oz)  SpO2: 95% 96% 95% 96%    General: NAD Cardiovascular: RRR Respiratory: CTA biL  Discharge Instructions     Medication List    STOP taking these medications        loratadine 10 MG tablet  Commonly known as:  CLARITIN     MINIVELLE 0.025 MG/24HR  Generic drug:  estradiol      TAKE these medications        ALPRAZolam 0.5 MG tablet  Commonly known as:  XANAX  Take 1 tablet (0.5 mg total) by mouth 2 (two) times daily.     amLODipine 10 MG tablet  Commonly known as:  NORVASC  Take 1 tablet (10 mg total) by mouth daily.     atorvastatin 10 MG tablet  Commonly known as:  LIPITOR  Take 1 tablet (10 mg total) by mouth daily at 6 PM.     calcium carbonate 500 MG chewable tablet  Commonly known as:  TUMS - dosed in mg elemental calcium  Chew 1 tablet by mouth every 8 (eight) hours as needed for indigestion or heartburn.     cetirizine 10 MG tablet  Commonly known as:  ZYRTEC  Take 10 mg by mouth as needed for allergies.     ciprofloxacin 500 MG tablet  Commonly known as:  CIPRO  Take 1 tablet (500 mg total) by mouth 2 (two) times daily. Start at 20:00 today and administer avery 12 hours     clonazePAM 0.5 MG tablet  Commonly known as:  KLONOPIN  Take 0.25 mg by mouth at bedtime.     clopidogrel 75 MG tablet  Commonly known as:  PLAVIX  Take 1 tablet (75 mg total) by mouth daily.     DULoxetine 20 MG capsule  Commonly known as:  CYMBALTA  Take 20 mg by mouth 3 (three) times daily.     escitalopram 5 MG tablet  Commonly known as:  LEXAPRO  Take 2.5 mg by mouth daily.     lactulose 10 GM/15ML solution  Commonly known as:  CHRONULAC  Take 15 mLs  (10 g total) by mouth 2 (two) times daily as needed for moderate constipation.     latanoprost 0.005 % ophthalmic solution  Commonly known as:  XALATAN  Place 1 drop into both eyes at bedtime.     levalbuterol 45 MCG/ACT inhaler  Commonly known as:  XOPENEX HFA  Inhale 1-2 puffs into the lungs every 6 (six) hours as needed for wheezing.     levothyroxine 50 MCG tablet  Commonly known as:  SYNTHROID, LEVOTHROID  Take 50 mcg by mouth daily.     metoprolol tartrate 25 MG tablet  Commonly known as:  LOPRESSOR  Take 0.5 tablets (12.5 mg total) by mouth 2 (two) times daily.     metroNIDAZOLE 500 MG tablet  Commonly known as:  FLAGYL  Take 1 tablet (500 mg total) by mouth 3 (three) times daily. Start today at 14:00     nitroGLYCERIN 0.4 MG SL tablet  Commonly known as:  NITROSTAT  Place 1 tablet (0.4 mg total) under the tongue every 5 (five) minutes as needed for chest pain (up to three times, if pain unrelieved call EMS).     Vitamin D (Ergocalciferol) 50000 UNITS Caps capsule  Commonly known as:  DRISDOL  Take 50,000 Units by mouth every 7 (seven) days.       Follow-up Information    Follow up with Emeterio Reeve, MD In 1 week.   Specialty:  Family Medicine   Contact information:   [address] [place] [address] [phone]       The results of significant diagnostics from this hospitalization (including imaging, microbiology, ancillary and laboratory) are listed below for reference.    Significant Diagnostic Studies: Dg Chest 2 View  10/06/2014   CLINICAL DATA:  81 year old who had a near syncopal episode while on the toilet earlier today, associated with diaphoresis.  EXAM: CHEST  2 VIEW  COMPARISON:  07/08/2014 and earlier.  FINDINGS: AP erect and lateral images were obtained. Suboptimal inspiration. Cardiac silhouette mildly enlarged, unchanged. Thoracic aorta tortuous and mildly atherosclerotic, unchanged. Moderate-sized hiatal hernia  containing an air-fluid level. Hilar and mediastinal contours otherwise unremarkable. Linear scarring in the right middle lobe and left upper lobe, unchanged. Nodular opacity projected over the right upper lobe on the AP image, not localized on the lateral. Mild pulmonary venous hypertension without overt edema. No confluent airspace consolidation. No pleural effusions. No pneumothorax. Thoracolumbar scoliosis convex right. Osseous demineralization.  IMPRESSION: 1. No acute cardiopulmonary disease. 2. Possible right upper lobe lung nodule visualized on the AP image, not  localized on the lateral. 3. Scarring in the right middle lobe and left upper lobe. 4. Stable mild cardiomegaly. Pulmonary venous hypertension without overt edema. CT the chest may be helpful in further evaluation to confirm or deny the presence of the right upper lobe lung nodule. I note that the patient is currently scheduled for CT abdomen and pelvis and this could be added on to that examination.   Electronically Signed   By: Hulan Saas M.D.   On: 10/06/2014 16:30   Ct Head Wo Contrast  10/06/2014   CLINICAL DATA:  Per [facility], pt had near syncopal episode on the toilet, c/o abd pain, rate 2/10, diaphoretic, denies c/o AND SOB. Hx of anxiety/COPD, CVA 6/16, left facial droop.  EXAM: CT HEAD WITHOUT CONTRAST  TECHNIQUE: Contiguous axial images were obtained from the base of the skull through the vertex without intravenous contrast.  COMPARISON:  07/08/2014  FINDINGS: There is central and cortical atrophy. Periventricular white matter changes are consistent with small vessel disease. There is no intra or extra-axial fluid collection or mass lesion. The basilar cisterns and ventricles have a normal appearance. There is no CT evidence for acute infarction or hemorrhage.  Left mastoid effusion is noted.  Bilateral hearing aids.  IMPRESSION: 1. Atrophy and small vessel disease. 2.  No evidence for acute intracranial abnormality. 3. Small left  mastoid effusion.   Electronically Signed   By: Norva Pavlov M.D.   On: 10/06/2014 17:37   Ct Abdomen Pelvis W Contrast  10/06/2014   CLINICAL DATA:  Abdominal pain and syncope today.  EXAM: CT ABDOMEN AND PELVIS WITH CONTRAST  TECHNIQUE: Multidetector CT imaging of the abdomen and pelvis was performed using the standard protocol following bolus administration of intravenous contrast.  CONTRAST:  OMNIPAQUE IOHEXOL 300 MG/ML  SOLN  COMPARISON:  None.  FINDINGS: Lower chest: The lung bases demonstrate patchy tree-in-bud appearance typically seen with inflammatory lung disease or atypical infection such as MAC. There is also a left lower lobe bronchiectasis. There is a moderate to large hiatal hernia. The heart is mildly enlarged. No pericardial effusion.  Hepatobiliary: No focal hepatic lesions are identified. And mild intra and extrahepatic biliary dilatation likely age related. The gallbladder is mildly contracted. Probable phyrigian cap.  Pancreas: No mass, inflammation or ductal dilatation.  Mild atrophy.  Spleen: Normal size.  No focal lesions.  Adrenals/Urinary Tract: The adrenal glands and kidneys are unremarkable. No renal, ureteral or bladder calculi. No renal or bladder mass.  Stomach/Bowel: The stomach, duodenum, small bowel and colon are unremarkable. No inflammatory changes, mass lesions or obstructive findings. There is a large amount of stool throughout the colon which may suggest constipation. There is mild focal wall thickening of the transverse colon suggesting mild colitis.  Vascular/Lymphatic: No mesenteric or retroperitoneal mass or adenopathy. The aorta is normal in caliber. Scattered atherosclerotic calcifications.  Other: The uterus is surgically absent. No pelvic mass or adenopathy. The bladder appears normal. No inguinal mass or adenopathy.  Musculoskeletal: No significant bony findings. Mild scoliosis and degenerative lumbar spondylosis.  IMPRESSION: Constipation and fecal  impaction in the rectum with mild transverse colitis.  No other significant abdominal/ pelvic findings.  Moderate to large hiatal hernia.   Electronically Signed   By: Rudie Meyer M.D.   On: 10/06/2014 17:48    Microbiology: Recent Results (from the past 240 hour(s))  MRSA PCR Screening     Status: None   Collection Time: 10/07/14  5:01 AM  Result Value Ref  Range Status   MRSA by PCR NEGATIVE NEGATIVE Final    Comment:        The GeneXpert MRSA Assay (FDA approved for NASAL specimens only), is one component of a comprehensive MRSA colonization surveillance program. It is not intended to diagnose MRSA infection nor to guide or monitor treatment for MRSA infections.      Labs: Basic Metabolic Panel:  Recent Labs Lab 10/06/14 1608 10/07/14 0526  NA 139 137  K 4.0 4.0  CL 105 104  CO2 24 26  GLUCOSE 147* 101*  BUN 19 12  CREATININE 0.95 0.78  CALCIUM 8.2* 8.3*   Liver Function Tests:  Recent Labs Lab 10/06/14 1608  AST 30  ALT 15  ALKPHOS 100  BILITOT 0.3  PROT 6.7  ALBUMIN 3.6   CBC:  Recent Labs Lab 10/06/14 1608  WBC 16.9*  NEUTROABS 14.9*  HGB 9.7*  HCT 32.4*  MCV 82.4  PLT 227   Cardiac Enzymes:  Recent Labs Lab 10/06/14 1608  TROPONINI <0.03   CBG:  Recent Labs Lab 10/06/14 1622  GLUCAP 137*       Signed:  GHERGHE, COSTIN  [facility] 10/08/2014, 11:08 AM

## 2014-10-13 DIAGNOSIS — K529 Noninfective gastroenteritis and colitis, unspecified: Secondary | ICD-10-CM | POA: Diagnosis not present

## 2014-10-13 DIAGNOSIS — I10 Essential (primary) hypertension: Secondary | ICD-10-CM | POA: Diagnosis not present

## 2014-11-08 DIAGNOSIS — Z23 Encounter for immunization: Secondary | ICD-10-CM | POA: Diagnosis not present

## 2014-11-14 ENCOUNTER — Encounter: Payer: Self-pay | Admitting: Neurology

## 2014-11-14 ENCOUNTER — Ambulatory Visit (INDEPENDENT_AMBULATORY_CARE_PROVIDER_SITE_OTHER): Payer: Medicare Other | Admitting: Neurology

## 2014-11-14 VITALS — BP 127/73 | HR 73 | Ht 66.0 in | Wt 135.6 lb

## 2014-11-14 DIAGNOSIS — E785 Hyperlipidemia, unspecified: Secondary | ICD-10-CM | POA: Diagnosis not present

## 2014-11-14 DIAGNOSIS — I671 Cerebral aneurysm, nonruptured: Secondary | ICD-10-CM

## 2014-11-14 DIAGNOSIS — I6302 Cerebral infarction due to thrombosis of basilar artery: Secondary | ICD-10-CM

## 2014-11-14 DIAGNOSIS — I639 Cerebral infarction, unspecified: Secondary | ICD-10-CM | POA: Diagnosis not present

## 2014-11-14 DIAGNOSIS — I10 Essential (primary) hypertension: Secondary | ICD-10-CM | POA: Diagnosis not present

## 2014-11-14 NOTE — Progress Notes (Signed)
STROKE NEUROLOGY FOLLOW UP NOTE  NAME: Sarah [name] DOB: 06/06/1921  REASON FOR VISIT: stroke follow up HISTORY FROM: pt and chart  Today we had the pleasure of seeing Sarah [name] in follow-up at our Neurology Clinic. Pt was accompanied by daughter.   History Summary Sarah [name] is a 81 y.o. female with history ofTIAs, CHF, HTN, HLD, CAD with MI, migraine headaches, remote seizure disorder, and bilateral cavernous ICA aneurysms was admitted to [facility] on 07/08/14 for left facial droop, left arm weakness, gait imbalance and dysarthria. She did not receive IV t-PA due to improvement in deficits. MRI showed left SCA territory infarct, likely due to small vessel disease. CUS and 2D echo unremarkable. LDL 99 and A1C 6.0. She was on home ASA, so she was started on plavix and lipitor. Symptoms resolved and she was discharged to home with home health PT.  During admission, CXR showed right upper lobe mass, no further evaluation performed due to pt optioned no aggressive treatment even if found out to be malignancy. She was told to follow up with her pulmonologist [name].  MRA also showed b/l cavernous ICA aneurysm, comparing with MRA in 2013, right from 8x7 to now 10x9, left from 2-3 to now 77m. She was told to follow up outpt with neurology.  She had ED visit on 01/15/14 for right eye pain and vision loss with white out vision, neurology consulted, CT negative, MRI brain no stroke or no orbital pathology. ESR 35 ruled out GCA. She followed up with ophthalmology and gradually right eye vision loss resolved as well as eye pain. However, during the recent stroke, she had diplopia which was resolved. But then she had right eye visual decline and followed again with eye doctor, unclear etiology now and was told to follow up in 6 months.   09/08/14 follow up - the patient has been doing well. She followed up with Dr. BLamonte Sakaiand was told likely to be bronchiectasis, and favors conservative management  instead of CT chest with aggressive work up or treatment.      Her BP was high today 170/91. Her record also showing her BP at home ranging from 160-185. She is only on metoprolol 12.5 twice a day with metoprolol PRN at ALF. She stated that her BP sometimes spiked to 240. She has appointment on 09/14/2014 to see her PCP.  Still has right eye vision decreased, no change recently, ophthalmology told her to follow-up in 6 months, no diagnosis was given yet.  Interval History During the interval time, pt has been doing well. No stroke like symptoms. Her BP was high last visit, added norvasc 174mdaily. However, she developed ankle swelling, so dose decreased to 52m56maily. Her BP much better now, at home 120s. Today 127/73 in clinic. She has no complains. She has seen eye doctor for right eye vision decrease and was told to monitor in 6 months.   REVIEW OF SYSTEMS: Full 14 system review of systems performed and notable only for those listed below and in HPI above, all others are negative:  Constitutional:  Unexpected weight change Cardiovascular: leg swelling Ear/Nose/Throat:  Hearing loss Skin:  Eyes:  Blurry vision Respiratory:  Wheezing, cough, SOB Gastroitestinal:   Genitourinary: incontinence of bladder Hematology/Lymphatic:   Endocrine:  Musculoskeletal:   Allergy/Immunology:  Frequent infections Neurological:  Headache, speech difficulty with wrong words Psychiatric: Anxiety, disinterest in activities Sleep:   The following represents the patient's updated allergies and side effects list: Allergies  Allergen  Reactions  . Codeine Anaphylaxis  . Hydrocodone Nausea And Vomiting and Other (See Comments)    SYNCOPE AND BRADYCARDIA  . Isosorbide Other (See Comments)    BRADYCARDIA  . Oxycodone Palpitations    Rapid heart beat  . Clarithromycin Nausea And Vomiting  . Fiorinal [Butalbital-Aspirin-Caffeine] Swelling and Other (See Comments)    Facial swelling   . Levofloxacin Nausea  And Vomiting and Other (See Comments)    SYNCOPE ALSO    The neurologically relevant items on the patient's problem list were reviewed on today's visit.  Neurologic Examination  A problem focused neurological exam (12 or more points of the single system neurologic examination, vital signs counts as 1 point, cranial nerves count for 8 points) was performed.  Blood pressure 127/73, pulse 73, height _0  (1.676 m), weight 135 lb 9.6 oz (61.508 kg), last menstrual period 01/14/1970.  General - Well nourished, well developed, in no apparent distress, mildly anxious.  Ophthalmologic - Fundi not visualized due to eye movement.  Cardiovascular - Regular rate and rhythm with no murmur.  Mental Status -  Level of arousal and orientation to time, place, and person were intact. Language including expression, naming, repetition, comprehension was assessed and found intact. Concentration and attention intact Memory registration 3/3, delayed recall 1/3. Fund of Knowledge was assessed and was intact.  Cranial Nerves II - XII - II - Visual field intact OU. Right eye visual acuity decrease to finger counting. III, IV, VI - Extraocular movements intact. V - Facial sensation intact bilaterally. VII - Facial movement intact bilaterally. VIII - Hearing & vestibular intact bilaterally. X - Palate elevates symmetrically. XI - Chin turning & shoulder shrug intact bilaterally. XII - Tongue protrusion intact.  Motor Strength - The patient's strength was normal in all extremities and pronator drift was absent.  Bulk was normal and fasciculations were absent.   Motor Tone - Muscle tone was assessed at the neck and appendages and was normal.  Reflexes - The patient's reflexes were 1+ in all extremities and she had no pathological reflexes.  Sensory - Light touch, temperature/pinprick were assessed and were normal.    Coordination - The patient had normal movements in the hands with no ataxia or  dysmetria.  Tremor was absent.  Gait and Station - walk with walker, stooped posturing, small stride, slow, but steady, no fall.  Data reviewed: I personally reviewed the images and agree with the radiology interpretations.  Dg Chest 2 View 07/08/2014  1. Cardiomegaly without edema.  2. Left lower lobe infiltrate, likely infectious.  3. Suspicious right upper lobe mass. Further evaluation with chest CT is recommended. Contrast administration is recommended unless contraindicated.   Ct Head Wo Contrast 07/08/2014  No acute intracranial findings. Chronic changes as described.   MRI / MRA Brain Wo Contrast 07/08/2014  1. 2 cm acute left cerebellar SCA territory infarct.  2. Mild chronic small vessel ischemic disease.  3. Large left mastoid effusion.  4. No major intracranial arterial occlusion or significant proximal stenosis.  5. Bilateral cavernous carotid aneurysms, slightly increased in size since 2013 MRA.  The aneurysm on the right measures approximately 10 x 9 mm (previously 8 x 7 mm on prior MRA). Left cavernous carotid aneurysm measures 4 mm (previously 2-3 mm).   2D echo - - Left ventricle: The cavity size was normal. Systolic function was vigorous. The estimated ejection fraction was in the range of 65% to 70%. Wall motion was normal; there were no regional wall motion  abnormalities. Features are consistent with a pseudonormal left ventricular filling pattern, with concomitant abnormal relaxation and increased filling pressure (grade 2 diastolic dysfunction). There was no evidence of elevated ventricular filling pressure by Doppler parameters. - Aortic valve: Trileaflet; mildly thickened leaflets. There was no regurgitation. - Aortic root: The aortic root was normal in size. - Mitral valve: Structurally normal valve. There was mild regurgitation. - Left atrium: The atrium was mildly dilated. - Right  ventricle: Systolic function was normal. - Tricuspid valve: There was trivial regurgitation. - Pulmonic valve: There was no regurgitation. - Pulmonary arteries: Systolic pressure was within the normal range. - Inferior vena cava: The vessel was normal in size. - Pericardium, extracardiac: There was no pericardial effusion.  CUS - Bilateral: 1-39% ICA stenosis. Vertebral artery flow is antegrade.  Component     Latest Ref Rng 07/09/2014  Cholesterol     0 - 200 mg/dL 179  Triglycerides     <150 mg/dL 66  HDL Cholesterol     >40 mg/dL 67  Total CHOL/HDL Ratio      2.7  VLDL     0 - 40 mg/dL 13  LDL (calc)     0 - 99 mg/dL 99  Hemoglobin A1C     4.8 - 5.6 % 6.0 (H)  Mean Plasma Glucose      126    Assessment: As you may recall, she is a 81 y.o. Caucasian female with PMH of TIAs, CHF, HTN, HLD, CAD with MI, migraine headaches, remote seizure disorder, and bilateral cavernous ICA aneurysms was admitted to [facility] on 07/08/14 for left SCA territory infarct, likely due to small vessel disease. CUS and 2D echo unremarkable. LDL 99 and A1C 6.0. She was discharged on plavix and lipitor. Symptoms resolved. She still has right eye vision decrease, not able to be explained by stroke, she is following with her ophthalmologist. MRA during admission again showing bilateral cavernous ICA aneurysm, slightly bigger than 3 years ago. Currently, patient option conservative management for the aneurysms. However, her BP was high last visit, put on amlodipine 10 mg daily, later decreased to 3m due to ankle swelling. This visit, BP much better. Will need to repeat MRA in one year.  Plan:  - continue plavix and lipitor for stroke prevention. - check BP at home, goal BP <140.  - continue metoprolol 12.532mbid, and norvasc 64m48maily.  - will repeat MRA next June. - follow up with Dr. ByrLamonte Sakaigularly - follow up with ophthalmology for right vision loss. - Follow up with your primary care physician for  stroke risk factor modification. Recommend maintain blood pressure goal <140/80, diabetes with hemoglobin A1c goal below 6.5% and lipids with LDL cholesterol goal below 70 mg/dL.  - follow up in 6 months.   No orders of the defined types were placed in this encounter.    Meds ordered this encounter  Medications  . amLODipine (NORVASC) 5 MG tablet    Sig:     Patient Instructions  - continue plavix and lipitor for stroke prevention. - check BP at home, goal BP <140.  - continue metoprolol 12.64mg86md, and norvasc 64mg 12mly.  - will repeat MRA next June. - follow up with Dr. ByrumLamonte Sakailarly - follow up with ophthalmology for right vision loss. - Follow up with your primary care physician for stroke risk factor modification. Recommend maintain blood pressure goal <140/80, diabetes with hemoglobin A1c goal below 6.5% and lipids with LDL cholesterol goal below 70 mg/dL.  -  follow up in 6 months.    Rosalin Hawking, MD PhD [facility], [facility] [address] [place], [place] [address] [phone]

## 2014-11-14 NOTE — Patient Instructions (Signed)
-   continue plavix and lipitor for stroke prevention. - check BP at home, goal BP <140.  - continue metoprolol 12.5mg  bid, and norvasc 5mg  daily.  - will repeat MRA next June. - follow up with Dr. Delton CoombesByrum regularly - follow up with ophthalmology for right vision loss. - Follow up with your primary care physician for stroke risk factor modification. Recommend maintain blood pressure goal <140/80, diabetes with hemoglobin A1c goal below 6.5% and lipids with LDL cholesterol goal below 70 mg/dL.  - follow up in 6 months.

## 2014-11-28 DIAGNOSIS — J069 Acute upper respiratory infection, unspecified: Secondary | ICD-10-CM | POA: Diagnosis not present

## 2014-11-28 DIAGNOSIS — J209 Acute bronchitis, unspecified: Secondary | ICD-10-CM | POA: Diagnosis not present

## 2014-12-01 ENCOUNTER — Ambulatory Visit (INDEPENDENT_AMBULATORY_CARE_PROVIDER_SITE_OTHER)
Admission: RE | Admit: 2014-12-01 | Discharge: 2014-12-01 | Disposition: A | Discharge status: Home / Self Care | Payer: Medicare Other | Source: Ambulatory Visit | Attending: Emergency Medicine | Admitting: Emergency Medicine

## 2014-12-01 ENCOUNTER — Ambulatory Visit (INDEPENDENT_AMBULATORY_CARE_PROVIDER_SITE_OTHER): Payer: Medicare Other | Admitting: Emergency Medicine

## 2014-12-01 ENCOUNTER — Encounter: Payer: Self-pay | Admitting: Emergency Medicine

## 2014-12-01 ENCOUNTER — Telehealth: Payer: Self-pay | Admitting: Emergency Medicine

## 2014-12-01 VITALS — BP 106/64 | HR 114 | Wt 136.0 lb

## 2014-12-01 DIAGNOSIS — J479 Bronchiectasis, uncomplicated: Secondary | ICD-10-CM | POA: Diagnosis not present

## 2014-12-01 DIAGNOSIS — R509 Fever, unspecified: Secondary | ICD-10-CM | POA: Diagnosis not present

## 2014-12-01 DIAGNOSIS — R05 Cough: Secondary | ICD-10-CM | POA: Diagnosis not present

## 2014-12-01 DIAGNOSIS — R0602 Shortness of breath: Secondary | ICD-10-CM | POA: Diagnosis not present

## 2014-12-01 DIAGNOSIS — I639 Cerebral infarction, unspecified: Secondary | ICD-10-CM | POA: Diagnosis not present

## 2014-12-01 MED ORDER — AMOXICILLIN-POT CLAVULANATE 500-125 MG PO TABS: 1.0000 | ORAL_TABLET | Freq: Three times a day (TID) | ORAL | Status: DC

## 2014-12-01 MED ORDER — AZITHROMYCIN 250 MG PO TABS: 250.0000 mg | ORAL_TABLET | Freq: Every day | ORAL | Status: DC

## 2014-12-01 NOTE — Assessment & Plan Note (Signed)
Continue loratadine 

## 2014-12-01 NOTE — Telephone Encounter (Signed)
Please let the patient's daughter Sarah [name]  know that pt's chest x-ray does not show any evidence for a new pneumonia. We are able to see her inflammatory changes that we have been following over time. These are stable compared with her prior films. Have her continue the antibiotics as ordered

## 2014-12-01 NOTE — Patient Instructions (Signed)
Please continue prednisone until completed Please continue azithromycin 250 mg daily for the next 5 days Please start Augmentin 500/125 mg twice a day for 7 days Continue Xopenex nebulizer 3 times a day for the next 5 days Use Xopenex HFA inhaler as needed Chest x-ray today Continue loratadine 10 mg daily We will discuss possibly repeating a CT scan of the chest or restarting scheduled inhaled medication at your next visit after this acute illness has resolved Keep your existing follow-up appointment with Dr. Delton CoombesByrum

## 2014-12-01 NOTE — Telephone Encounter (Signed)
Spoke with the pt's daughter, Sharyl NimrodMeredith and notified of results/recs per RB  She verbalized understanding  Nothing further needed

## 2014-12-01 NOTE — Assessment & Plan Note (Signed)
We will discuss starting scheduled bronchodilators in the future depending on her status after this acute illness resolves

## 2014-12-01 NOTE — Assessment & Plan Note (Signed)
With an acute flare in the setting of suspected viral upper respiratory infection. I believe we need to broaden her antibiotics in order to adequately cover her for bronchitis organisms in the setting of bronchiectasis. I will continue her azithromycin and add Augmentin given her allergy profile. Continue scheduled Xopenex for now and complete prednisone. Chest x-ray today to assess for infiltrates and allow us a means for comparison if she progresses.

## 2014-12-01 NOTE — Progress Notes (Signed)
   Subjective:    Patient ID: Sarah FlavorsGertrude Bibb, female    DOB: 1921-10-31, 81 y.o.   MRN: [id number]  HPI 81 yo never smoker, hx remote Legionella PNA, diagnosed with suspected asthma about 10 years ago. PFT's 7/10 with mixed disease, mild AFL. Formerly treated with Advair and albuterol, stopped when she moved to [place] in 2008. Stayed on Singulair. At original consult we d/c'd ACE-I. Started on Symbicort.    ROV 12/17/13 -- follow up visit for chronic cough, bronchiectasis. She has been dong fairly well. She is going through a hard time because her husband has needed to move to a memory ward.  She is having some intermittent HA, no real cough right now. Last CT was 02/2012.   ROV 08/29/14 -- follow-up visit for bronchiectasis and presumed mycobacterial disease (based on CT appearance), associated chronic cough.  She was hospitalized in June for acute left cerebellar stroke.  History is taken from the patient and from her daughter Sharyl NimrodMeredith. She is still having some L side weakness, R visual disturbance, imbalance.  She is still having cough, she is using xopenex when she exerts and when she cough. She has had some episodes of rash and lip swelling, doesn't happen every day, got some better off codeine.   ROV 12/01/14 --  History of chronic cough, bronchiectasis with parabronchovascular micronodular disease consistent with mycobacterial disease. We have never treated this.  She had been doing well until about 5 days ago, then she developed weakness, aches, flu-like sx, fever. Then cough. Started pred and azithro. Her sputum has become purulent.  Her xopenex use has increased over the last month.  She is on loratdine.                                                 Review of Systems As above      Objective:   Physical Exam  Filed Vitals:   12/01/14 1030  BP: 106/64  Pulse: 114  Weight: 136 lb (61.689 kg)  SpO2: 88%    GEN: A/Ox3; pleasant , NAD, elderly and thin   HEENT:  [place]/AT,  NOSE-clear,  THROAT-clear, no lesions, no postnasal drip or exudate noted.   NECK:  Supple w/ fair ROM; no JVD; normal carotid impulses w/o bruits; no thyromegaly or nodules palpated; no lymphadenopathy.  RESP  Few B insp crackles, more on the L , no dullness to percussion  CARD:  RRR, no m/r/g  , no peripheral edema, pulses intact, no cyanosis or clubbing.  Musco: Warm bil, no deformities or joint swelling noted.   Neuro: alert, no focal deficits noted.    Skin: Warm, no lesions or rashes      Assessment & Plan:  Bronchiectasis With an acute flare in the setting of suspected viral upper respiratory infection. I believe we need to broaden her antibiotics in order to adequately cover her for bronchitis organisms in the setting of bronchiectasis. I will continue her azithromycin and add Augmentin given her allergy profile. Continue scheduled Xopenex for now and complete prednisone. Chest x-ray today to assess for infiltrates and allow us a means for comparison if she progresses.   ALLERGIC RHINITIS Continue loratadine  ASTHMA We will discuss starting scheduled bronchodilators in the future depending on her status after this acute illness resolves

## 2014-12-01 NOTE — Telephone Encounter (Signed)
[facility]. Erskine SquibbJane calling about pt's CXR from 12/01/2014 :  IMPRESSION: Nodular density seen in right upper lobe which is grossly stable compared to prior exam. CT scan of the chest is recommended for further evaluation.  Dr. Delton CoombesByrum, please advise. Thanks.

## 2014-12-06 ENCOUNTER — Telehealth: Payer: Self-pay | Admitting: Emergency Medicine

## 2014-12-06 NOTE — Telephone Encounter (Signed)
Spoke with pt's daughter. States that after she eats she gets diarrhea. Pt thinks this is coming from Augmentin. Per the pt's daughter all of her respiratory symptoms have cleared up. Pt wants to stop the medication.  Allergies  Allergen Reactions  . Codeine Anaphylaxis  . Hydrocodone Nausea And Vomiting and Other (See Comments)    SYNCOPE AND BRADYCARDIA  . Isosorbide Other (See Comments)    BRADYCARDIA  . Oxycodone Palpitations    Rapid heart beat  . Clarithromycin Nausea And Vomiting  . Fiorinal [Butalbital-Aspirin-Caffeine] Swelling and Other (See Comments)    Facial swelling   . Levofloxacin Nausea And Vomiting and Other (See Comments)    SYNCOPE ALSO   SN - please advise for RB since he worked night shift last night. Thanks.

## 2014-12-06 NOTE — Telephone Encounter (Signed)
Per SN: Stop Augmentin, take Probiotic like Align x 1 daily and Activia Yogurt daily  Patient's daughter notified. Nothing further needed. Closing encounter

## 2014-12-20 DIAGNOSIS — M6281 Muscle weakness (generalized): Secondary | ICD-10-CM | POA: Diagnosis not present

## 2014-12-20 DIAGNOSIS — I10 Essential (primary) hypertension: Secondary | ICD-10-CM | POA: Diagnosis not present

## 2014-12-20 DIAGNOSIS — I69322 Dysarthria following cerebral infarction: Secondary | ICD-10-CM | POA: Diagnosis not present

## 2014-12-20 DIAGNOSIS — J479 Bronchiectasis, uncomplicated: Secondary | ICD-10-CM | POA: Diagnosis not present

## 2014-12-20 DIAGNOSIS — G40909 Epilepsy, unspecified, not intractable, without status epilepticus: Secondary | ICD-10-CM | POA: Diagnosis not present

## 2014-12-20 DIAGNOSIS — I509 Heart failure, unspecified: Secondary | ICD-10-CM | POA: Diagnosis not present

## 2014-12-23 DIAGNOSIS — J479 Bronchiectasis, uncomplicated: Secondary | ICD-10-CM | POA: Diagnosis not present

## 2014-12-23 DIAGNOSIS — I10 Essential (primary) hypertension: Secondary | ICD-10-CM | POA: Diagnosis not present

## 2014-12-23 DIAGNOSIS — G40909 Epilepsy, unspecified, not intractable, without status epilepticus: Secondary | ICD-10-CM | POA: Diagnosis not present

## 2014-12-23 DIAGNOSIS — M6281 Muscle weakness (generalized): Secondary | ICD-10-CM | POA: Diagnosis not present

## 2014-12-23 DIAGNOSIS — I69322 Dysarthria following cerebral infarction: Secondary | ICD-10-CM | POA: Diagnosis not present

## 2014-12-23 DIAGNOSIS — I509 Heart failure, unspecified: Secondary | ICD-10-CM | POA: Diagnosis not present

## 2014-12-28 DIAGNOSIS — I10 Essential (primary) hypertension: Secondary | ICD-10-CM | POA: Diagnosis not present

## 2014-12-28 DIAGNOSIS — G40909 Epilepsy, unspecified, not intractable, without status epilepticus: Secondary | ICD-10-CM | POA: Diagnosis not present

## 2014-12-28 DIAGNOSIS — M6281 Muscle weakness (generalized): Secondary | ICD-10-CM | POA: Diagnosis not present

## 2014-12-28 DIAGNOSIS — J479 Bronchiectasis, uncomplicated: Secondary | ICD-10-CM | POA: Diagnosis not present

## 2014-12-28 DIAGNOSIS — I509 Heart failure, unspecified: Secondary | ICD-10-CM | POA: Diagnosis not present

## 2014-12-28 DIAGNOSIS — I69322 Dysarthria following cerebral infarction: Secondary | ICD-10-CM | POA: Diagnosis not present

## 2015-01-02 DIAGNOSIS — M6281 Muscle weakness (generalized): Secondary | ICD-10-CM | POA: Diagnosis not present

## 2015-01-02 DIAGNOSIS — G40909 Epilepsy, unspecified, not intractable, without status epilepticus: Secondary | ICD-10-CM | POA: Diagnosis not present

## 2015-01-02 DIAGNOSIS — J479 Bronchiectasis, uncomplicated: Secondary | ICD-10-CM | POA: Diagnosis not present

## 2015-01-02 DIAGNOSIS — I10 Essential (primary) hypertension: Secondary | ICD-10-CM | POA: Diagnosis not present

## 2015-01-02 DIAGNOSIS — I69322 Dysarthria following cerebral infarction: Secondary | ICD-10-CM | POA: Diagnosis not present

## 2015-01-02 DIAGNOSIS — I509 Heart failure, unspecified: Secondary | ICD-10-CM | POA: Diagnosis not present

## 2015-01-03 DIAGNOSIS — R062 Wheezing: Secondary | ICD-10-CM | POA: Diagnosis not present

## 2015-01-03 DIAGNOSIS — J471 Bronchiectasis with (acute) exacerbation: Secondary | ICD-10-CM | POA: Diagnosis not present

## 2015-01-03 DIAGNOSIS — R05 Cough: Secondary | ICD-10-CM | POA: Diagnosis not present

## 2015-01-11 DIAGNOSIS — M6281 Muscle weakness (generalized): Secondary | ICD-10-CM | POA: Diagnosis not present

## 2015-01-11 DIAGNOSIS — I509 Heart failure, unspecified: Secondary | ICD-10-CM | POA: Diagnosis not present

## 2015-01-11 DIAGNOSIS — I69322 Dysarthria following cerebral infarction: Secondary | ICD-10-CM | POA: Diagnosis not present

## 2015-01-11 DIAGNOSIS — I10 Essential (primary) hypertension: Secondary | ICD-10-CM | POA: Diagnosis not present

## 2015-01-11 DIAGNOSIS — J479 Bronchiectasis, uncomplicated: Secondary | ICD-10-CM | POA: Diagnosis not present

## 2015-01-11 DIAGNOSIS — G40909 Epilepsy, unspecified, not intractable, without status epilepticus: Secondary | ICD-10-CM | POA: Diagnosis not present

## 2015-01-13 DIAGNOSIS — I69322 Dysarthria following cerebral infarction: Secondary | ICD-10-CM | POA: Diagnosis not present

## 2015-01-13 DIAGNOSIS — I10 Essential (primary) hypertension: Secondary | ICD-10-CM | POA: Diagnosis not present

## 2015-01-13 DIAGNOSIS — M6281 Muscle weakness (generalized): Secondary | ICD-10-CM | POA: Diagnosis not present

## 2015-01-13 DIAGNOSIS — G40909 Epilepsy, unspecified, not intractable, without status epilepticus: Secondary | ICD-10-CM | POA: Diagnosis not present

## 2015-01-13 DIAGNOSIS — I509 Heart failure, unspecified: Secondary | ICD-10-CM | POA: Diagnosis not present

## 2015-01-13 DIAGNOSIS — J479 Bronchiectasis, uncomplicated: Secondary | ICD-10-CM | POA: Diagnosis not present

## 2015-01-18 DIAGNOSIS — M6281 Muscle weakness (generalized): Secondary | ICD-10-CM | POA: Diagnosis not present

## 2015-01-18 DIAGNOSIS — J479 Bronchiectasis, uncomplicated: Secondary | ICD-10-CM | POA: Diagnosis not present

## 2015-01-18 DIAGNOSIS — I10 Essential (primary) hypertension: Secondary | ICD-10-CM | POA: Diagnosis not present

## 2015-01-18 DIAGNOSIS — I509 Heart failure, unspecified: Secondary | ICD-10-CM | POA: Diagnosis not present

## 2015-01-18 DIAGNOSIS — I69322 Dysarthria following cerebral infarction: Secondary | ICD-10-CM | POA: Diagnosis not present

## 2015-01-18 DIAGNOSIS — G40909 Epilepsy, unspecified, not intractable, without status epilepticus: Secondary | ICD-10-CM | POA: Diagnosis not present

## 2015-01-20 DIAGNOSIS — J479 Bronchiectasis, uncomplicated: Secondary | ICD-10-CM | POA: Diagnosis not present

## 2015-01-20 DIAGNOSIS — M6281 Muscle weakness (generalized): Secondary | ICD-10-CM | POA: Diagnosis not present

## 2015-01-20 DIAGNOSIS — I509 Heart failure, unspecified: Secondary | ICD-10-CM | POA: Diagnosis not present

## 2015-01-20 DIAGNOSIS — G40909 Epilepsy, unspecified, not intractable, without status epilepticus: Secondary | ICD-10-CM | POA: Diagnosis not present

## 2015-01-20 DIAGNOSIS — I69322 Dysarthria following cerebral infarction: Secondary | ICD-10-CM | POA: Diagnosis not present

## 2015-01-20 DIAGNOSIS — I10 Essential (primary) hypertension: Secondary | ICD-10-CM | POA: Diagnosis not present

## 2015-01-23 DIAGNOSIS — I509 Heart failure, unspecified: Secondary | ICD-10-CM | POA: Diagnosis not present

## 2015-01-23 DIAGNOSIS — I10 Essential (primary) hypertension: Secondary | ICD-10-CM | POA: Diagnosis not present

## 2015-01-23 DIAGNOSIS — J479 Bronchiectasis, uncomplicated: Secondary | ICD-10-CM | POA: Diagnosis not present

## 2015-01-23 DIAGNOSIS — I69322 Dysarthria following cerebral infarction: Secondary | ICD-10-CM | POA: Diagnosis not present

## 2015-01-23 DIAGNOSIS — G40909 Epilepsy, unspecified, not intractable, without status epilepticus: Secondary | ICD-10-CM | POA: Diagnosis not present

## 2015-01-23 DIAGNOSIS — M6281 Muscle weakness (generalized): Secondary | ICD-10-CM | POA: Diagnosis not present

## 2015-01-27 DIAGNOSIS — I69322 Dysarthria following cerebral infarction: Secondary | ICD-10-CM | POA: Diagnosis not present

## 2015-01-27 DIAGNOSIS — I10 Essential (primary) hypertension: Secondary | ICD-10-CM | POA: Diagnosis not present

## 2015-01-27 DIAGNOSIS — M6281 Muscle weakness (generalized): Secondary | ICD-10-CM | POA: Diagnosis not present

## 2015-01-27 DIAGNOSIS — J479 Bronchiectasis, uncomplicated: Secondary | ICD-10-CM | POA: Diagnosis not present

## 2015-01-27 DIAGNOSIS — I509 Heart failure, unspecified: Secondary | ICD-10-CM | POA: Diagnosis not present

## 2015-01-27 DIAGNOSIS — G40909 Epilepsy, unspecified, not intractable, without status epilepticus: Secondary | ICD-10-CM | POA: Diagnosis not present

## 2015-01-31 ENCOUNTER — Ambulatory Visit (INDEPENDENT_AMBULATORY_CARE_PROVIDER_SITE_OTHER): Payer: Medicare Other | Admitting: Emergency Medicine

## 2015-01-31 ENCOUNTER — Encounter: Payer: Self-pay | Admitting: Emergency Medicine

## 2015-01-31 VITALS — BP 122/74 | HR 80 | Wt 138.0 lb

## 2015-01-31 DIAGNOSIS — J479 Bronchiectasis, uncomplicated: Secondary | ICD-10-CM

## 2015-01-31 NOTE — Patient Instructions (Signed)
We will refill and continue your albuterol nebulizers for you to use as needed.  We will hold on any CT scan of the chest for now.  Follow with Dr Delton Coombes in 4 months or sooner if you have any problems.

## 2015-01-31 NOTE — Assessment & Plan Note (Signed)
With a recent flare that improved slowly with prednisone and azithromycin. She was unable to treat with Augmentin due to side effects. She is now improved. I suspect that she does have Mycobacterium avium but at this point we have agreed that treatment for this for many months would not be worth the benefits. Therefore we will defer a repeat CT scan of the chest. Continue her albuterol nebulizers when necessary as she has benefited. Follow in 3 months

## 2015-01-31 NOTE — Progress Notes (Signed)
Subjective:    Patient ID: Sarah [name], female    DOB: 1921-02-02, 82 y.o.   MRN: [id number]  HPI 82 yo never smoker, hx remote Legionella PNA, diagnosed with suspected asthma about 10 years ago. PFT's 7/10 with mixed disease, mild AFL. Formerly treated with Advair and albuterol, stopped when she moved to [place] in 2008. Stayed on Singulair. At original consult we d/c'd ACE-I. Started on Symbicort.    ROV 12/17/13 -- follow up visit for chronic cough, bronchiectasis. She has been dong fairly well. She is going through a hard time because her husband has needed to move to a memory ward.  She is having some intermittent HA, no real cough right now. Last CT was 02/2012.   ROV 08/29/14 -- follow-up visit for bronchiectasis and presumed mycobacterial disease (based on CT appearance), associated chronic cough.  She was hospitalized in June for acute left cerebellar stroke.  History is taken from the patient and from her daughter Sharyl Nimrod. She is still having some L side weakness, R visual disturbance, imbalance.  She is still having cough, she is using xopenex when she exerts and when she cough. She has had some episodes of rash and lip swelling, doesn't happen every day, got some better off codeine.   ROV 12/01/14 --  History of chronic cough, bronchiectasis with parabronchovascular micronodular disease consistent with mycobacterial disease. We have never treated this.  She had been doing well until about 5 days ago, then she developed weakness, aches, flu-like sx, fever. Then cough. Started pred and azithro. Her sputum has become purulent.  Her xopenex use has increased over the last month.  She is on loratdine.            ROV 01/31/15 --   History of bronchiectasis and chronic cough. Suspected mycobacterial disease although has never fully been established. At her last visit she had an apparent acute bronchitis. She was treated with prednisone, azithromycin, Augmentin.  She had to stop the augmentin due to  side effects. She feels that her cough is better. She improved late December after being treated x 2 w abx. Her energy is improved. She does still have some cough with dark mucous plugs. She is using albuterol nebs with some benefit.      Review of Systems As above      Objective:   Physical Exam  Filed Vitals:   01/31/15 1416  BP: 122/74  Pulse: 80  Weight: 138 lb (62.596 kg)  SpO2: 95%    GEN: A/Ox3; pleasant , NAD, elderly and thin   HEENT:  [place]/AT,  NOSE-clear, THROAT-clear, no lesions, no postnasal drip or exudate noted.   NECK:  Supple w/ fair ROM; no JVD; normal carotid impulses w/o bruits; no thyromegaly or nodules palpated; no lymphadenopathy.  RESP  Few B insp crackles, more on the L , no dullness to percussion  CARD:  RRR, no m/r/g  , no peripheral edema, pulses intact, no cyanosis or clubbing.  Musco: Warm bil, no deformities or joint swelling noted.   Neuro: alert, no focal deficits noted.    Skin: Warm, no lesions or rashes      Assessment & Plan:  Bronchiectasis With a recent flare that improved slowly with prednisone and azithromycin. She was unable to treat with Augmentin due to side effects. She is now improved. I suspect that she does have Mycobacterium avium but at this point we have agreed that treatment for this for many months would not be worth the benefits.  Therefore we will defer a repeat CT scan of the chest. Continue her albuterol nebulizers when necessary as she has benefited. Follow in 3 months

## 2015-02-01 DIAGNOSIS — J479 Bronchiectasis, uncomplicated: Secondary | ICD-10-CM | POA: Diagnosis not present

## 2015-02-01 DIAGNOSIS — I509 Heart failure, unspecified: Secondary | ICD-10-CM | POA: Diagnosis not present

## 2015-02-01 DIAGNOSIS — G40909 Epilepsy, unspecified, not intractable, without status epilepticus: Secondary | ICD-10-CM | POA: Diagnosis not present

## 2015-02-01 DIAGNOSIS — I10 Essential (primary) hypertension: Secondary | ICD-10-CM | POA: Diagnosis not present

## 2015-02-01 DIAGNOSIS — M6281 Muscle weakness (generalized): Secondary | ICD-10-CM | POA: Diagnosis not present

## 2015-02-01 DIAGNOSIS — I69322 Dysarthria following cerebral infarction: Secondary | ICD-10-CM | POA: Diagnosis not present

## 2015-02-03 DIAGNOSIS — J479 Bronchiectasis, uncomplicated: Secondary | ICD-10-CM | POA: Diagnosis not present

## 2015-02-03 DIAGNOSIS — I69322 Dysarthria following cerebral infarction: Secondary | ICD-10-CM | POA: Diagnosis not present

## 2015-02-03 DIAGNOSIS — M6281 Muscle weakness (generalized): Secondary | ICD-10-CM | POA: Diagnosis not present

## 2015-02-03 DIAGNOSIS — G40909 Epilepsy, unspecified, not intractable, without status epilepticus: Secondary | ICD-10-CM | POA: Diagnosis not present

## 2015-02-03 DIAGNOSIS — I10 Essential (primary) hypertension: Secondary | ICD-10-CM | POA: Diagnosis not present

## 2015-02-03 DIAGNOSIS — I509 Heart failure, unspecified: Secondary | ICD-10-CM | POA: Diagnosis not present

## 2015-02-06 DIAGNOSIS — M6281 Muscle weakness (generalized): Secondary | ICD-10-CM | POA: Diagnosis not present

## 2015-02-06 DIAGNOSIS — I10 Essential (primary) hypertension: Secondary | ICD-10-CM | POA: Diagnosis not present

## 2015-02-06 DIAGNOSIS — G40909 Epilepsy, unspecified, not intractable, without status epilepticus: Secondary | ICD-10-CM | POA: Diagnosis not present

## 2015-02-06 DIAGNOSIS — J479 Bronchiectasis, uncomplicated: Secondary | ICD-10-CM | POA: Diagnosis not present

## 2015-02-06 DIAGNOSIS — I509 Heart failure, unspecified: Secondary | ICD-10-CM | POA: Diagnosis not present

## 2015-02-06 DIAGNOSIS — I69322 Dysarthria following cerebral infarction: Secondary | ICD-10-CM | POA: Diagnosis not present

## 2015-02-10 DIAGNOSIS — G40909 Epilepsy, unspecified, not intractable, without status epilepticus: Secondary | ICD-10-CM | POA: Diagnosis not present

## 2015-02-10 DIAGNOSIS — I10 Essential (primary) hypertension: Secondary | ICD-10-CM | POA: Diagnosis not present

## 2015-02-10 DIAGNOSIS — M6281 Muscle weakness (generalized): Secondary | ICD-10-CM | POA: Diagnosis not present

## 2015-02-10 DIAGNOSIS — J479 Bronchiectasis, uncomplicated: Secondary | ICD-10-CM | POA: Diagnosis not present

## 2015-02-10 DIAGNOSIS — I69322 Dysarthria following cerebral infarction: Secondary | ICD-10-CM | POA: Diagnosis not present

## 2015-02-10 DIAGNOSIS — I509 Heart failure, unspecified: Secondary | ICD-10-CM | POA: Diagnosis not present

## 2015-02-14 ENCOUNTER — Encounter: Payer: Self-pay | Admitting: Cardiovascular Disease

## 2015-02-14 ENCOUNTER — Ambulatory Visit (INDEPENDENT_AMBULATORY_CARE_PROVIDER_SITE_OTHER): Payer: Medicare Other | Admitting: Cardiovascular Disease

## 2015-02-14 VITALS — BP 120/80 | HR 80 | Ht 66.0 in | Wt 137.1 lb

## 2015-02-14 DIAGNOSIS — I10 Essential (primary) hypertension: Secondary | ICD-10-CM | POA: Diagnosis not present

## 2015-02-14 NOTE — Patient Instructions (Signed)
Medication Instructions:  Your physician recommends that you continue on your current medications as directed. Please refer to the Current Medication list given to you today.   Labwork: None Ordered   Testing/Procedures: None Ordered   Follow-Up: Your physician wants you to follow-up in: 1 year with Dr. Nahser.  You will receive a reminder letter in the mail two months in advance. If you don't receive a letter, please call our office to schedule the follow-up appointment.   If you need a refill on your cardiac medications before your next appointment, please call your pharmacy.   Thank you for choosing [facility]! Zahki Hoogendoorn, RN [phone]    

## 2015-02-14 NOTE — Progress Notes (Signed)
Sarah [name] Date of Birth: 1921/12/19 Medical Record #[id number]  Problem list: 1. Hypertension 2. Asthma 3. Depression 4. Coronary artery disease-status post non-ST segment elevation myocardial infarction. Cardiac cath revealed nonobstructive coronary artery disease 5. Bronchiectasis / atypical mycobacterium infection   History of Present Illness: Sarah [name] is seen back today following a recent hospitalization. She was admitted to the hospital with chest tightness. She ruled out for myocardial infarction. She has a history of chest pain in the past and a cardiac catheterization in 2012 was unremarkable.  She originally called EMS when her BP was 220/110  She has had significant difficulty controlling her BP recently.   She has been watching her salt.  She has taken amlodipine and Lisinopril in the past but these have been stopped because she seemed to be overmedicated.    She continues to have intermittent episodes of chest pressure and heaviness.  These are typically associated with marked hypertension although it is not clear whether the hypertension occurs first or as a result of her  feeling poorly.  Jun 11, 2012:  Sarah [name] was seen by Norma Fredrickson in February, 2014. She had been measuring her blood pressure at least 10 times every day and was worried because her blood pressure seemed to be somewhat labile.  She feels poorly today.  She did not sleep well last night.  She had a headache.   She has lots of anxiety issues.  She takes xanax with some relief.    June 2 , 2015:  Sarah [name] is doing ok.  Upset about having to put her husband in an memory care SNF.  Lots of guilt about that.    Has a home sitter with her today. No significant CP or dyspnea.  She has some anxiety - has attacks on occasion.  Her anxiety seems to start in her chest as a CP.  If she takes the xanax and Tylenol #3 in time, she can avoid the panic attacks.    She thinks she had 3 TIAs this past year.   Carotid duplex was negative.  MRI was normal.    She has been on 1 ASA 325 mg a day.   Jan. 31, 2017:  Sarah [name] is ding well.   BP and HR look great today Still short of breath .  Has pulmonary issues.   Sees Dr. Delton Coombes .   ASA 325 mg a day Current Outpatient Prescriptions on File Prior to Visit  Medication Sig Dispense Refill  . ALPRAZolam (XANAX) 0.5 MG tablet Take 1 tablet (0.5 mg total) by mouth 2 (two) times daily.  0  . amLODipine (NORVASC) 5 MG tablet Take 5 mg by mouth daily.     Marland Kitchen atorvastatin (LIPITOR) 10 MG tablet Take 1 tablet (10 mg total) by mouth daily at 6 PM. 30 tablet 1  . calcium carbonate (TUMS - DOSED IN MG ELEMENTAL CALCIUM) 500 MG chewable tablet Chew 1 tablet by mouth every 8 (eight) hours as needed for indigestion or heartburn.    . clonazePAM (KLONOPIN) 0.5 MG tablet Take 0.25 mg by mouth at bedtime.     . clopidogrel (PLAVIX) 75 MG tablet Take 1 tablet (75 mg total) by mouth daily. 30 tablet 1  . DULoxetine (CYMBALTA) 20 MG capsule Take 20 mg by mouth 3 (three) times daily.    Marland Kitchen escitalopram (LEXAPRO) 5 MG tablet Take 2.5 mg by mouth daily.    Marland Kitchen lactulose (CHRONULAC) 10 GM/15ML solution Take 15 mLs (10 g  total) by mouth 2 (two) times daily as needed for moderate constipation. 240 mL 0  . latanoprost (XALATAN) 0.005 % ophthalmic solution Place 1 drop into both eyes at bedtime.     . levalbuterol (XOPENEX HFA) 45 MCG/ACT inhaler Inhale 1-2 puffs into the lungs every 6 (six) hours as needed for wheezing.    . levalbuterol (XOPENEX) 1.25 MG/3ML nebulizer solution Take 1.25 mg by nebulization every 4 (four) hours as needed for wheezing.    Marland Kitchen levothyroxine (SYNTHROID, LEVOTHROID) 50 MCG tablet Take 50 mcg by mouth daily.      Marland Kitchen loratadine (CLARITIN) 10 MG tablet Take 10 mg by mouth daily.    . metoprolol tartrate (LOPRESSOR) 25 MG tablet Take 0.5 tablets (12.5 mg total) by mouth 2 (two) times daily. (Sarah [name] taking differently: Take 12.5 mg by mouth 2 (two) times daily.  Can take 1 addt'l dose for systolic bp >150) 30 tablet 3  . Vitamin D, Ergocalciferol, (DRISDOL) 50000 UNITS CAPS capsule Take 50,000 Units by mouth every 7 (seven) days.     No current facility-administered medications on file prior to visit.    Allergies  Allergen Reactions  . Codeine Anaphylaxis  . Hydrocodone Nausea And Vomiting and Other (See Comments)    SYNCOPE AND BRADYCARDIA  . Isosorbide Other (See Comments)    BRADYCARDIA  . Oxycodone Palpitations    Rapid heart beat  . Clarithromycin Nausea And Vomiting  . Fiorinal [Butalbital-Aspirin-Caffeine] Swelling and Other (See Comments)    Facial swelling   . Levofloxacin Nausea And Vomiting and Other (See Comments)    SYNCOPE ALSO  . Augmentin [Amoxicillin-Pot Clavulanate] Diarrhea    Past Medical History  Diagnosis Date  . Bronchiectasis     with history of Legionaires with chronic rales/rhonchi  . Pollen allergies   . Hypertension     Negative renal duplex in December of 2012  . Allergic rhinitis   . Diastolic dysfunction     a. per echo 08/2010 with normal LV function;  b. 06/2011 Echo: EF 65-70%  . Idiopathic peripheral neuropathy (HCC)   . Hypothyroidism   . Depression   . H/O hiatal hernia   . Osteopenia 02/2011    t score - 2.1  . Hypercholesteremia   . Chest pain at rest   . NSTEMI (non-ST elevated myocardial infarction) (HCC)     a. Normal coronaries per cath August 2012 and negative CT angio for PE;  b. 06/2011 Repeat admission w/ chest pain and elevated troponin's, CTA Chest w/o PE  . Asthma   . MAC (mycobacterium avium-intracellulare complex)   . Shortness of breath     "sometimes just lying down" (04/15/2012)  . Pneumonia     "recurrent" (04/15/2012)  . H/O Legionnaire's disease 11/1976  . History of blood transfusion 1972    "w/hysterectomy" (04/15/2012)  . GERD (gastroesophageal reflux disease)     "just recently" (04/15/2012)  . Daily headache     "over the last week" 04/15/2012   . Migraines      "outgrew them" (04/15/2012)  . Arthritis     "a little; in my back and hands" (04/15/2012)  . Anxiety     Anxiety attacks  . Stroke [facility]) 06/2014    cerebellum    Past Surgical History  Procedure Laterality Date  . Dilation and curettage of uterus    . Appendectomy    . Cardiac catheterization  August 2012    Normal coronaries.  . Tonsillectomy  1930  . Total abdominal  hysterectomy w/ bilateral salpingoophorectomy  1972    leiomyomata, menorrhagia    History  Smoking status  . Never Smoker   Smokeless tobacco  . Never Used    History  Alcohol Use  . 1.2 oz/week  . 2 Glasses of wine per week    Comment: wine 1-2 week    Family History  Problem Relation Age of Onset  . Hypertension Mother     stroke  . Stroke Mother   . Cancer Brother     prostate  . Osteoarthritis Paternal Aunt     Review of Systems: The review of systems is per the HPI.  All other systems were reviewed and are negative.  Physical Exam: BP 120/80 mmHg  Pulse 80  Ht  (1.676 m)  Wt 137 lb 1.9 oz (62.197 kg)  BMI 22.14 kg/m2  LMP 01/14/1970 Sarah [name] is very pleasant and in no acute distress. Skin is warm and dry. Color is normal.  HEENT is unremarkable. Normocephalic/atraumatic. PERRL. Sclera are nonicteric. Neck is supple. No masses. No JVD. Lungs show some persistant rales / rhonchi which are chronic. Cardiac exam shows a regular rate and rhythm. Abdomen is soft. Extremities are without edema. Gait and ROM are intact. No gross neurologic deficits noted.  LABORATORY DATA:  Lab Results  Component Value Date   WBC 16.9* 10/06/2014   HGB 9.7* 10/06/2014   HCT 32.4* 10/06/2014   PLT 227 10/06/2014   GLUCOSE 101* 10/07/2014   CHOL 179 07/09/2014   TRIG 66 07/09/2014   HDL 67 07/09/2014   LDLCALC 99 07/09/2014   ALT 15 10/06/2014   AST 30 10/06/2014   NA 137 10/07/2014   K 4.0 10/07/2014   CL 104 10/07/2014   CREATININE 0.78 10/07/2014   BUN 12 10/07/2014   CO2 26 10/07/2014   TSH  3.321 11/23/2011   INR 1.10 07/08/2014   HGBA1C 6.0* 07/09/2014   ECG: 06/11/2012: Sinus bradycardia at 58 beats a minute. She has no ST or T wave changes.  Assessment / Plan:  1. Hypertension- BP is well controlled now.   Continue same meds.  2. Asthma 3. Depression 4. Coronary artery disease-status post non-ST segment elevation myocardial infarction. Cardiac cath revealed nonobstructive coronary artery disease 5. Bronchiectasis / atypical mycobacterium infection  Followed by Dimas Alexandria, Deloris Ping, MD  02/14/2015 2:03 PM    [facility] [address] [place],  [address] [place], [place]  [address] Pager [phone] Phone: [phone]; Fax: [phone]   [facility]  [address] [place], [place]  [address] [phone]   Fax [phone]

## 2015-02-22 DIAGNOSIS — Z961 Presence of intraocular lens: Secondary | ICD-10-CM | POA: Diagnosis not present

## 2015-02-22 DIAGNOSIS — H353121 Nonexudative age-related macular degeneration, left eye, early dry stage: Secondary | ICD-10-CM | POA: Diagnosis not present

## 2015-02-22 DIAGNOSIS — H53451 Other localized visual field defect, right eye: Secondary | ICD-10-CM | POA: Diagnosis not present

## 2015-02-22 DIAGNOSIS — H353211 Exudative age-related macular degeneration, right eye, with active choroidal neovascularization: Secondary | ICD-10-CM | POA: Diagnosis not present

## 2015-03-06 ENCOUNTER — Telehealth: Payer: Self-pay | Admitting: Emergency Medicine

## 2015-03-06 MED ORDER — DOXYCYCLINE HYCLATE 100 MG PO TABS: 100.0000 mg | ORAL_TABLET | Freq: Two times a day (BID) | ORAL | Status: DC

## 2015-03-06 NOTE — Telephone Encounter (Signed)
Last ov on 01/31/15 with RB   Patient Instructions       We will refill and continue your albuterol nebulizers for you to use as needed.   We will hold on any CT scan of the chest for now.  Follow with Dr Delton Coombes in 4 months or sooner if you have any problems.      Called and spoke with pt's daughter. Johnny Bridge states the pt is c/o prod cough but is unsure of mucus color, SOB, fever, wheezing, and chest congestion. Denies any sinus drainage/congestion, nausea or vomiting. Johnny Bridge states that her mother is taking her nebs and they do help but feels she would benefit from something being called into the pharmacy. Johnny Bridge is unsure if her the pt is taking anything for the fever. I scheduled the pt with an TP on 03/07/15 at 12pm. Johnny Bridge requested a message be sent to [facility] for further recs. I explained to Johnny Bridge that I would send a message to RB and would return her call with his recs. Pt's daughter voiced understanding and had no further questions.   RB please advise

## 2015-03-06 NOTE — Telephone Encounter (Signed)
Called spoke with patient's daughter Johnny Bridge and discussed RB's recs.  Johnny Bridge voiced her understanding and denied any questions/concerns.  Pt to keep the 2.21.17 appt with TP.  Rx telephoned to [facility] in [place] (pharmacy location per [employer]	[place] request) to pharmacist Rob.  Nothing further needed; will sign off.

## 2015-03-06 NOTE — Telephone Encounter (Signed)
I agree that she needs to be seen tomorrow.  Ok to call in doxycycline 100 bid x 7 days, then have her discuss with TP whether she should continue this

## 2015-03-07 ENCOUNTER — Encounter: Payer: Self-pay | Admitting: Adult Health

## 2015-03-07 ENCOUNTER — Ambulatory Visit (INDEPENDENT_AMBULATORY_CARE_PROVIDER_SITE_OTHER): Payer: Medicare Other | Admitting: Adult Health

## 2015-03-07 VITALS — BP 110/76 | HR 75 | Temp 97.8°F | Wt 137.0 lb

## 2015-03-07 DIAGNOSIS — J471 Bronchiectasis with (acute) exacerbation: Secondary | ICD-10-CM | POA: Diagnosis not present

## 2015-03-07 MED ORDER — PREDNISONE 10 MG PO TABS: ORAL_TABLET | ORAL | Status: DC

## 2015-03-07 NOTE — Patient Instructions (Signed)
Finish Doxycycline as planned .  Prednisone taper over next week.  Mucinex Twice daily  As needed  Cough/congestion  follow up Dr. Delton Coombes  As planned and As needed   Please contact office for sooner follow up if symptoms do not improve or worsen or seek emergency care

## 2015-03-15 NOTE — Assessment & Plan Note (Signed)
Flare   Plan  Finish Doxycycline as planned .  Prednisone taper over next week.  Mucinex Twice daily  As needed  Cough/congestion  follow up Dr. Delton Coombes  As planned and As needed   Please contact office for sooner follow up if symptoms do not improve or worsen or seek emergency care

## 2015-03-15 NOTE — Progress Notes (Signed)
Subjective:    Patient ID: Sarah [name], female    DOB: Jun 01, 1921, 82 y.o.   MRN: [id number]  HPI 82 yo never smoker with remote legionella PNA , asthma, chronic cough, and bronchiectasis  W/ suspected MAI on CT   2/02/15/15 Acute OV : Bronchiectasis  Pt presents for an acute office visit.  Complains of chest tightness/congestion, prod cough with clear to dark brown mucus, sinus pressure, low grade fever at bedtime, SOB and wheezing starting 03/02/15. Denies sinus drainage, nausea or vomiting. Pt is currently taking doxycyline that was given on 03/06/15 by RB.  She denies chest pain, orthopnea, edema or hemoptysis .   Past Medical History  Diagnosis Date  . Bronchiectasis     with history of Legionaires with chronic rales/rhonchi  . Pollen allergies   . Hypertension     Negative renal duplex in December of 2012  . Allergic rhinitis   . Diastolic dysfunction     a. per echo 08/2010 with normal LV function;  b. 06/2011 Echo: EF 65-70%  . Idiopathic peripheral neuropathy (HCC)   . Hypothyroidism   . Depression   . H/O hiatal hernia   . Osteopenia 02/2011    t score - 2.1  . Hypercholesteremia   . Chest pain at rest   . NSTEMI (non-ST elevated myocardial infarction) (HCC)     a. Normal coronaries per cath August 2012 and negative CT angio for PE;  b. 06/2011 Repeat admission w/ chest pain and elevated troponin's, CTA Chest w/o PE  . Asthma   . MAC (mycobacterium avium-intracellulare complex)   . Shortness of breath     "sometimes just lying down" (04/15/2012)  . Pneumonia     "recurrent" (04/15/2012)  . H/O Legionnaire's disease 11/1976  . History of blood transfusion 1972    "w/hysterectomy" (04/15/2012)  . GERD (gastroesophageal reflux disease)     "just recently" (04/15/2012)  . Daily headache     "over the last week" 04/15/2012   . Migraines     "outgrew them" (04/15/2012)  . Arthritis     "a little; in my back and hands" (04/15/2012)  . Anxiety     Anxiety attacks  . Stroke [facility])  06/2014    cerebellum   Current Outpatient Prescriptions on File Prior to Visit  Medication Sig Dispense Refill  . ALPRAZolam (XANAX) 0.5 MG tablet Take 1 tablet (0.5 mg total) by mouth 2 (two) times daily.  0  . amLODipine (NORVASC) 5 MG tablet Take 5 mg by mouth daily.     Marland Kitchen atorvastatin (LIPITOR) 10 MG tablet Take 1 tablet (10 mg total) by mouth daily at 6 PM. 30 tablet 1  . calcium carbonate (TUMS - DOSED IN MG ELEMENTAL CALCIUM) 500 MG chewable tablet Chew 1 tablet by mouth every 8 (eight) hours as needed for indigestion or heartburn.    . clonazePAM (KLONOPIN) 0.5 MG tablet Take 0.25 mg by mouth at bedtime.     . clopidogrel (PLAVIX) 75 MG tablet Take 1 tablet (75 mg total) by mouth daily. 30 tablet 1  . doxycycline (VIBRA-TABS) 100 MG tablet Take 1 tablet (100 mg total) by mouth 2 (two) times daily. 14 tablet 0  . DULoxetine (CYMBALTA) 20 MG capsule Take 20 mg by mouth 3 (three) times daily.    Marland Kitchen escitalopram (LEXAPRO) 5 MG tablet Take 2.5 mg by mouth daily.    Marland Kitchen lactulose (CHRONULAC) 10 GM/15ML solution Take 15 mLs (10 g total) by mouth 2 (two) times  daily as needed for moderate constipation. 240 mL 0  . latanoprost (XALATAN) 0.005 % ophthalmic solution Place 1 drop into both eyes at bedtime.     . levalbuterol (XOPENEX HFA) 45 MCG/ACT inhaler Inhale 1-2 puffs into the lungs every 6 (six) hours as needed for wheezing.    . levalbuterol (XOPENEX) 1.25 MG/3ML nebulizer solution Take 1.25 mg by nebulization every 4 (four) hours as needed for wheezing.    Marland Kitchen levothyroxine (SYNTHROID, LEVOTHROID) 50 MCG tablet Take 50 mcg by mouth daily.      Marland Kitchen loratadine (CLARITIN) 10 MG tablet Take 10 mg by mouth daily.    . metoprolol tartrate (LOPRESSOR) 25 MG tablet Take 0.5 tablets (12.5 mg total) by mouth 2 (two) times daily. (Patient taking differently: Take 12.5 mg by mouth 2 (two) times daily. Can take 1 addt'l dose for systolic bp >150) 30 tablet 3  . Vitamin D, Ergocalciferol, (DRISDOL) 50000 UNITS  CAPS capsule Take 50,000 Units by mouth every 7 (seven) days.     No current facility-administered medications on file prior to visit.      Review of Systems  Constitutional:   No  weight loss, night sweats,  Fevers, chills,  +fatigue, or  lassitude.  HEENT:   No headaches,  Difficulty swallowing,  Tooth/dental problems, or  Sore throat,                No sneezing, itching, ear ache, + nasal congestion, post nasal drip,   CV:  No chest pain,  Orthopnea, PND, swelling in lower extremities, anasarca, dizziness, palpitations, syncope.   GI  No heartburn, indigestion, abdominal pain, nausea, vomiting, diarrhea, change in bowel habits, loss of appetite, bloody stools.   Resp:   No chest wall deformity  Skin: no rash or lesions.  GU: no dysuria, change in color of urine, no urgency or frequency.  No flank pain, no hematuria   MS:  No joint pain or swelling.  No decreased range of motion.  No back pain.  Psych:  No change in mood or affect. No depression or anxiety.  No memory loss.          Objective:   Physical Exam   Filed Vitals:   03/07/15 1212  BP: 110/76  Pulse: 75  Temp: 97.8 F (36.6 C)  TempSrc: Oral  Weight: 137 lb (62.143 kg)  SpO2: 92%   GEN: A/Ox3; pleasant , NAD, elderly   HEENT:  [place]/AT,  EACs-clear, TMs-wnl, NOSE-clear, THROAT-clear, no lesions, no postnasal drip or exudate noted.   NECK:  Supple w/ fair ROM; no JVD; normal carotid impulses w/o bruits; no thyromegaly or nodules palpated; no lymphadenopathy.  RESP  Few trace rhonchi , no accessory muscle use, no dullness to percussion  CARD:  RRR, no m/r/g  , no peripheral edema, pulses intact, no cyanosis or clubbing.  GI:   Soft & nt; nml bowel sounds; no organomegaly or masses detected.  Musco: Warm bil, no deformities or joint swelling noted.   Neuro: alert, no focal deficits noted.    Skin: Warm, no lesions or rashes       Assessment & Plan:

## 2015-03-21 ENCOUNTER — Encounter: Payer: Self-pay | Admitting: Emergency Medicine

## 2015-03-21 ENCOUNTER — Other Ambulatory Visit (INDEPENDENT_AMBULATORY_CARE_PROVIDER_SITE_OTHER): Payer: Medicare Other

## 2015-03-21 ENCOUNTER — Ambulatory Visit (INDEPENDENT_AMBULATORY_CARE_PROVIDER_SITE_OTHER): Payer: Medicare Other | Admitting: Emergency Medicine

## 2015-03-21 VITALS — BP 120/74 | HR 67 | Wt 141.0 lb

## 2015-03-21 DIAGNOSIS — J4520 Mild intermittent asthma, uncomplicated: Secondary | ICD-10-CM | POA: Diagnosis not present

## 2015-03-21 DIAGNOSIS — E2740 Unspecified adrenocortical insufficiency: Secondary | ICD-10-CM

## 2015-03-21 DIAGNOSIS — E039 Hypothyroidism, unspecified: Secondary | ICD-10-CM | POA: Diagnosis not present

## 2015-03-21 LAB — TSH: TSH: 1.4 u[IU]/mL (ref 0.35–4.50)

## 2015-03-21 LAB — CORTISOL: Cortisol, Plasma: 5.6 ug/dL

## 2015-03-21 MED ORDER — PREDNISONE 5 MG PO TABS: 5.0000 mg | ORAL_TABLET | Freq: Every day | ORAL | Status: DC

## 2015-03-21 NOTE — Assessment & Plan Note (Signed)
Her thyroid placement could relate to her fatigue. We will check a TSH today and recommend any possible changes in her hypothyroidism

## 2015-03-21 NOTE — Patient Instructions (Addendum)
We will start prednisone 5mg  daily. Depending on how you respond we will consider changing dose slowly up or down.  Please use xopenex nebulizer as needed.  We will check thyroid blood work today.  Follow with Dr Delton CoombesByrum next available to review your status on the prednisone.

## 2015-03-21 NOTE — Assessment & Plan Note (Signed)
Suspect that her fatigue is related to adrenal insufficiency after being treated recently with a prednisone taper. She's had responses like this before associated with fatigue, hypertension, feeling faint. I like to start her on prednisone 5 mg daily empirically. We will check a random cortisol today. Depending on her response we will titrate the prednisone either up or down.

## 2015-03-21 NOTE — Progress Notes (Signed)
Subjective:    Patient ID: Sarah [name], female    DOB: [birth year], 82 y.o.   MRN: [id number]  HPI 82 yo never smoker, hx remote Legionella PNA, diagnosed with suspected asthma about 10 years ago. PFT's 7/10 with mixed disease, mild AFL. Formerly treated with Advair and albuterol, stopped when she moved to [place] in 2008. Stayed on Singulair. At original consult we d/c'd ACE-I. Started on Symbicort.    ROV 12/17/13 -- follow up visit for chronic cough, bronchiectasis. She has been dong fairly well. She is going through a hard time because her husband has needed to move to a memory ward.  She is having some intermittent HA, no real cough right now. Last CT was 02/2012.   ROV 08/29/14 -- follow-up visit for bronchiectasis and presumed mycobacterial disease (based on CT appearance), associated chronic cough.  She was hospitalized in June for acute left cerebellar stroke.  History is taken from the patient and from her daughter Sarah [name]. She is still having some L side weakness, R visual disturbance, imbalance.  She is still having cough, she is using xopenex when she exerts and when she cough. She has had some episodes of rash and lip swelling, doesn't happen every day, got some better off codeine.   ROV 12/01/14 --  History of chronic cough, bronchiectasis with parabronchovascular micronodular disease consistent with mycobacterial disease. We have never treated this.  She had been doing well until about 5 days ago, then she developed weakness, aches, flu-like sx, fever. Then cough. Started pred and azithro. Her sputum has become purulent.  Her xopenex use has increased over the last month.  She is on loratdine.            ROV 01/31/15 --   History of bronchiectasis and chronic cough. Suspected mycobacterial disease although has never fully been established. At her last visit she had an apparent acute bronchitis. She was treated with prednisone, azithromycin, Augmentin.  She had to stop the augmentin due to  side effects. She feels that her cough is better. She improved late December after being treated x 2 w abx. Her energy is improved. She does still have some cough with dark mucous plugs. She is using albuterol nebs with some benefit.     ROV 03/21/15 -- patient with a history of bronchiectasis and chronic cough. She also has suspected mycobacterial colonization although we've never proven this definitively. I treated her 2/20 with doxycycline for a suspected bronchitis. She was then seen in our office on 221 and started on prednisone as well. She returns today describing weakness, exertional fatigue and dyspnea, feels like she could faint. She is doing some coughing - less after the abx but still occuring. Has mucous every day, coughs up clear, occasional darker plugs. She notes that the fatigue relates to her discontinuation of her prednisone. She has had sx suggestive of adrenal insufficiency. Occasional tearfulness today.    [place] living in [place].   Review of Systems As above      Objective:   Physical Exam  Filed Vitals:   03/21/15 1106  BP: 120/74  Pulse: 67  Weight: 141 lb (63.957 kg)  SpO2: 94%    GEN: A/Ox3; pleasant , NAD, elderly and thin   HEENT:  [place]/AT,  NOSE-clear, THROAT-clear, no lesions, no postnasal drip or exudate noted.   NECK:  Supple w/ fair ROM; no JVD; normal carotid impulses w/o bruits; no thyromegaly or nodules palpated; no lymphadenopathy.  RESP  Few B  insp crackles, more on the L , no dullness to percussion  CARD:  RRR, no m/r/g  , no peripheral edema, pulses intact, no cyanosis or clubbing.  Musco: Warm bil, no deformities or joint swelling noted.   Neuro: alert, no focal deficits noted.    Skin: Warm, no lesions or rashes      Assessment & Plan:  Asthma She does have some exertional dyspnea but her biggest complaint appears to be exertional fatigue and overall fatigue. She's noticed this in particular after coming off of her prednisone.  Continue Xopenex when necessary  Adrenal insufficiency (HCC) Suspect that her fatigue is related to adrenal insufficiency after being treated recently with a prednisone taper. She's had responses like this before associated with fatigue, hypertension, feeling faint. I like to start her on prednisone 5 mg daily empirically. We will check a random cortisol today. Depending on her response we will titrate the prednisone either up or down.   Hypothyroidism Her thyroid placement could relate to her fatigue. We will check a TSH today and recommend any possible changes in her hypothyroidism

## 2015-03-21 NOTE — Assessment & Plan Note (Addendum)
She does have some exertional dyspnea but her biggest complaint appears to be exertional fatigue and overall fatigue. She's noticed this in particular after coming off of her prednisone. Continue Xopenex when necessary

## 2015-03-24 ENCOUNTER — Ambulatory Visit: Payer: Self-pay | Admitting: Adult Health

## 2015-03-24 ENCOUNTER — Ambulatory Visit (INDEPENDENT_AMBULATORY_CARE_PROVIDER_SITE_OTHER)
Admission: RE | Admit: 2015-03-24 | Discharge: 2015-03-24 | Disposition: A | Discharge status: Home / Self Care | Payer: Medicare Other | Source: Ambulatory Visit | Attending: Adult Health | Admitting: Adult Health

## 2015-03-24 ENCOUNTER — Encounter: Payer: Self-pay | Admitting: Adult Health

## 2015-03-24 ENCOUNTER — Ambulatory Visit (INDEPENDENT_AMBULATORY_CARE_PROVIDER_SITE_OTHER): Payer: Medicare Other | Admitting: Adult Health

## 2015-03-24 VITALS — BP 118/72 | HR 89 | Temp 98.4°F | Ht 66.0 in | Wt 139.4 lb

## 2015-03-24 DIAGNOSIS — J471 Bronchiectasis with (acute) exacerbation: Secondary | ICD-10-CM | POA: Diagnosis not present

## 2015-03-24 DIAGNOSIS — R509 Fever, unspecified: Secondary | ICD-10-CM | POA: Diagnosis not present

## 2015-03-24 DIAGNOSIS — R05 Cough: Secondary | ICD-10-CM | POA: Diagnosis not present

## 2015-03-24 MED ORDER — AZITHROMYCIN 250 MG PO TABS: ORAL_TABLET | ORAL | Status: AC

## 2015-03-24 NOTE — Patient Instructions (Signed)
Z-Pack take as directed  Mucinex DM Twice daily  As needed  Cough/congestion  Chest xray today .  Follow up.Dr. Delton CoombesByrum as planned and As needed   Please contact office for sooner follow up if symptoms do not improve or worsen or seek emergency care

## 2015-03-30 NOTE — Progress Notes (Signed)
Subjective:    Patient ID: Sarah [name], female    DOB: June 19, 1921, 82 y.o.   MRN: [id number]  HPI 82 yo never smoker with remote legionella PNA , asthma, chronic cough, and bronchiectasis  W/ suspected MAI on CT   03/24/15  Acute OV : Bronchiectasis  Pt presents for an acute office visit.  Complains of cough with brown mucus, chills with fever of 101 last night, wheeze/SOB, sharp pains on her lower left back/flank area, and some nausea. Pt denies any emesis. Pt is concerned about not improving over the past few months.  CXR today shows chronic changes with no acute PNA .  Seen 3 days ago , started on low steroids due to frequent flares off steroids ? Questionable adrenal insufficiency .  She denies chest pain, orthopnea, edema or hemoptysis .  Appetite is fair w/ no vomiting .   Labs last ov with nml TSH and Cortisol level.   Past Medical History  Diagnosis Date  . Bronchiectasis     with history of Legionaires with chronic rales/rhonchi  . Pollen allergies   . Hypertension     Negative renal duplex in December of 2012  . Allergic rhinitis   . Diastolic dysfunction     a. per echo 08/2010 with normal LV function;  b. 06/2011 Echo: EF 65-70%  . Idiopathic peripheral neuropathy (HCC)   . Hypothyroidism   . Depression   . H/O hiatal hernia   . Osteopenia 02/2011    t score - 2.1  . Hypercholesteremia   . Chest pain at rest   . NSTEMI (non-ST elevated myocardial infarction) (HCC)     a. Normal coronaries per cath August 2012 and negative CT angio for PE;  b. 06/2011 Repeat admission w/ chest pain and elevated troponin's, CTA Chest w/o PE  . Asthma   . MAC (mycobacterium avium-intracellulare complex)   . Shortness of breath     "sometimes just lying down" (04/15/2012)  . Pneumonia     "recurrent" (04/15/2012)  . H/O Legionnaire's disease 11/1976  . History of blood transfusion 1972    "w/hysterectomy" (04/15/2012)  . GERD (gastroesophageal reflux disease)     "just recently"  (04/15/2012)  . Daily headache     "over the last week" 04/15/2012   . Migraines     "outgrew them" (04/15/2012)  . Arthritis     "a little; in my back and hands" (04/15/2012)  . Anxiety     Anxiety attacks  . Stroke [facility](HCC) 06/2014    cerebellum   Current Outpatient Prescriptions on File Prior to Visit  Medication Sig Dispense Refill  . ALPRAZolam (XANAX) 0.5 MG tablet Take 1 tablet (0.5 mg total) by mouth 2 (two) times daily.  0  . amLODipine (NORVASC) 5 MG tablet Take 5 mg by mouth daily.     Marland Kitchen. atorvastatin (LIPITOR) 10 MG tablet Take 1 tablet (10 mg total) by mouth daily at 6 PM. 30 tablet 1  . calcium carbonate (TUMS - DOSED IN MG ELEMENTAL CALCIUM) 500 MG chewable tablet Chew 1 tablet by mouth every 8 (eight) hours as needed for indigestion or heartburn.    . clonazePAM (KLONOPIN) 0.5 MG tablet Take 0.25 mg by mouth at bedtime.     . clopidogrel (PLAVIX) 75 MG tablet Take 1 tablet (75 mg total) by mouth daily. 30 tablet 1  . DULoxetine (CYMBALTA) 20 MG capsule Take 20 mg by mouth 3 (three) times daily.    Marland Kitchen. escitalopram (LEXAPRO) 5  MG tablet Take 2.5 mg by mouth daily.    Marland Kitchen lactulose (CHRONULAC) 10 GM/15ML solution Take 15 mLs (10 g total) by mouth 2 (two) times daily as needed for moderate constipation. 240 mL 0  . latanoprost (XALATAN) 0.005 % ophthalmic solution Place 1 drop into both eyes at bedtime.     . levalbuterol (XOPENEX HFA) 45 MCG/ACT inhaler Inhale 1-2 puffs into the lungs every 6 (six) hours as needed for wheezing.    . levalbuterol (XOPENEX) 1.25 MG/3ML nebulizer solution Take 1.25 mg by nebulization every 4 (four) hours as needed for wheezing.    Marland Kitchen levothyroxine (SYNTHROID, LEVOTHROID) 50 MCG tablet Take 50 mcg by mouth daily.      Marland Kitchen loratadine (CLARITIN) 10 MG tablet Take 10 mg by mouth daily.    . metoprolol tartrate (LOPRESSOR) 25 MG tablet Take 0.5 tablets (12.5 mg total) by mouth 2 (two) times daily. (Patient taking differently: Take 12.5 mg by mouth 2 (two) times daily.  Can take 1 addt'l dose for systolic bp >150) 30 tablet 3  . predniSONE (DELTASONE) 5 MG tablet Take 1 tablet (5 mg total) by mouth daily with breakfast. 30 tablet 5  . Vitamin D, Ergocalciferol, (DRISDOL) 50000 UNITS CAPS capsule Take 50,000 Units by mouth every 7 (seven) days.     No current facility-administered medications on file prior to visit.      Review of Systems  Constitutional:   No  weight loss, night sweats,  Fevers, chills,  +fatigue, or  lassitude.  HEENT:   No headaches,  Difficulty swallowing,  Tooth/dental problems, or  Sore throat,                No sneezing, itching, ear ache, + nasal congestion, post nasal drip,   CV:  No chest pain,  Orthopnea, PND, swelling in lower extremities, anasarca, dizziness, palpitations, syncope.   GI  No heartburn, indigestion, abdominal pain, nausea, vomiting, diarrhea, change in bowel habits, loss of appetite, bloody stools.   Resp:   No chest wall deformity  Skin: no rash or lesions.  GU: no dysuria, change in color of urine, no urgency or frequency.  No flank pain, no hematuria   MS:  No joint pain or swelling.  No decreased range of motion.  No back pain.  Psych:  No change in mood or affect. No depression or anxiety.  No memory loss.          Objective:   Physical Exam   Filed Vitals:   03/24/15 1633  BP: 118/72  Pulse: 89  Temp: 98.4 F (36.9 C)  TempSrc: Oral  Height:  (1.676 m)  Weight: 139 lb 6.4 oz (63.231 kg)  SpO2: 92%   GEN: A/Ox3; pleasant , NAD, elderly   HEENT:  [place]/AT,  EACs-clear, TMs-wnl, NOSE-clear, THROAT-clear, no lesions, no postnasal drip or exudate noted.   NECK:  Supple w/ fair ROM; no JVD; normal carotid impulses w/o bruits; no thyromegaly or nodules palpated; no lymphadenopathy.  RESP  Few trace rhonchi , no accessory muscle use, no dullness to percussion  CARD:  RRR, no m/r/g  , no peripheral edema, pulses intact, no cyanosis or clubbing.  GI:   Soft & nt; nml bowel  sounds; no organomegaly or masses detected.  Musco: Warm bil, no deformities or joint swelling noted.   Neuro: alert, no focal deficits noted.    Skin: Warm, no lesions or rashes  Tammy Parrett NP-C  [facility]  03/24/15  Assessment & Plan:

## 2015-03-30 NOTE — Assessment & Plan Note (Signed)
Recurrent Flare  CXR with chronic changes , no acute process. Noted.   Plan  Z-Pack take as directed  Mucinex DM Twice daily  As needed  Cough/congestion  Chest xray today .  Follow up.Dr. Delton CoombesByrum as planned and As needed   Please contact office for sooner follow up if symptoms do not improve or worsen or seek emergency care

## 2015-05-04 ENCOUNTER — Ambulatory Visit (INDEPENDENT_AMBULATORY_CARE_PROVIDER_SITE_OTHER): Payer: Medicare Other | Admitting: Emergency Medicine

## 2015-05-04 ENCOUNTER — Encounter: Payer: Self-pay | Admitting: Emergency Medicine

## 2015-05-04 DIAGNOSIS — J471 Bronchiectasis with (acute) exacerbation: Secondary | ICD-10-CM | POA: Diagnosis not present

## 2015-05-04 MED ORDER — LEVALBUTEROL HCL 1.25 MG/3ML IN NEBU: 1.2500 mg | INHALATION_SOLUTION | Freq: Two times a day (BID) | RESPIRATORY_TRACT | Status: DC

## 2015-05-04 NOTE — Patient Instructions (Addendum)
Please continue your prednisone 5mg daily.  Continue your loratadine daily We will change your xopenex nebulizer to twice a day, every day.  Continue your flutter valve daily Please notify us if your cough changes in amount or color or if you begin to have fever or shortness of breath.  Follow with Dr Aaronjames Kelsay in 3 months or sooner if you have any problems. 

## 2015-05-04 NOTE — Assessment & Plan Note (Signed)
Please continue your prednisone 5mg  daily.  Continue your loratadine daily We will change your xopenex nebulizer to twice a day, every day.  Continue your flutter valve daily Please notify Sarah [name] if your cough changes in amount or color or if you begin to have fever or shortness of breath.  Follow with Dr Delton CoombesByrum in 3 months or sooner if you have any problems.

## 2015-05-04 NOTE — Assessment & Plan Note (Signed)
Increase xopenex to 2x a day, 1pm and 7pm

## 2015-05-04 NOTE — Progress Notes (Signed)
Subjective:    Patient ID: Sarah [name], female    DOB: 1921/04/28, 82 y.o.   MRN: [id number]  HPI 82 yo never smoker, hx remote Legionella PNA, diagnosed with suspected asthma about 10 years ago. PFT's 7/10 with mixed disease, mild AFL. Formerly treated with Advair and albuterol, stopped when she moved to [place] in 2008. Stayed on Singulair. At original consult we d/c'd ACE-I. Started on Symbicort.    ROV 12/17/13 -- follow up visit for chronic cough, bronchiectasis. She has been dong fairly well. She is going through a hard time because her husband has needed to move to a memory ward.  She is having some intermittent HA, no real cough right now. Last CT was 02/2012.   ROV 08/29/14 -- follow-up visit for bronchiectasis and presumed mycobacterial disease (based on CT appearance), associated chronic cough.  She was hospitalized in June for acute left cerebellar stroke.  History is taken from the patient and from her daughter Sharyl Nimrod. She is still having some L side weakness, R visual disturbance, imbalance.  She is still having cough, she is using xopenex when she exerts and when she cough. She has had some episodes of rash and lip swelling, doesn't happen every day, got some better off codeine.   ROV 12/01/14 --  History of chronic cough, bronchiectasis with parabronchovascular micronodular disease consistent with mycobacterial disease. We have never treated this.  She had been doing well until about 5 days ago, then she developed weakness, aches, flu-like sx, fever. Then cough. Started pred and azithro. Her sputum has become purulent.  Her xopenex use has increased over the last month.  She is on loratdine.            ROV 01/31/15 --   History of bronchiectasis and chronic cough. Suspected mycobacterial disease although has never fully been established. At her last visit she had an apparent acute bronchitis. She was treated with prednisone, azithromycin, Augmentin.  She had to stop the augmentin due to  side effects. She feels that her cough is better. She improved late December after being treated x 2 w abx. Her energy is improved. She does still have some cough with dark mucous plugs. She is using albuterol nebs with some benefit.     ROV 03/21/15 -- patient with a history of bronchiectasis and chronic cough. She also has suspected mycobacterial colonization although we've never proven this definitively. I treated her 2/20 with doxycycline for a suspected bronchitis. She was then seen in our office on 221 and started on prednisone as well. She returns today describing weakness, exertional fatigue and dyspnea, feels like she could faint. She is doing some coughing - less after the abx but still occuring. Has mucous every day, coughs up clear, occasional darker plugs. She notes that the fatigue relates to her discontinuation of her prednisone. She has had sx suggestive of adrenal insufficiency. Occasional tearfulness today.    ROV 05/04/15 -- patient with a history of chronic cough, bronchiectasis with probable mycobacterial disease although  never been fully diagnosed. She experienced some weakness and more dyspnea after her prednisone was stopped so I started her back on 5 mg daily at our last visit. She was subsequently seen in our office for a possible acute bronchiectasis flare. She recovered from the flare, does believe that the pred has helped her energy, her depression.  She is having some increased mucous and cough, prod of large volume clear mucous. On loratadine. Uses flutter intermittently.  Review of Systems As above      Objective:   Physical Exam  Filed Vitals:   05/04/15 1016  BP: 126/84  Pulse: 74  Weight: 142 lb (64.411 kg)  SpO2: 94%    GEN: A/Ox3; pleasant , NAD, elderly and thin   HEENT:  [place]/AT,  NOSE-clear, THROAT-clear, no lesions, no postnasal drip or exudate noted.   NECK:  Supple w/ fair ROM; no JVD; normal carotid impulses w/o bruits; no thyromegaly or nodules  palpated; no lymphadenopathy.  RESP  Few B insp crackles, more on the L , no dullness to percussion  CARD:  RRR, no m/r/g  , no peripheral edema, pulses intact, no cyanosis or clubbing.  Musco: Warm bil, no deformities or joint swelling noted.   Neuro: alert, no focal deficits noted.    Skin: Warm, no lesions or rashes      Assessment & Plan:  Bronchiectasis Please continue your prednisone 5mg  daily.  Continue your loratadine daily We will change your xopenex nebulizer to twice a day, every day.  Continue your flutter valve daily Please notify us if your cough changes in amount or color or if you begin to have fever or shortness of breath.  Follow with Dr Delton CoombesByrum in 3 months or sooner if you have any problems.  ALLERGIC RHINITIS Loratadine   Asthma Increase xopenex to 2x a day, 1pm and 7pm

## 2015-05-04 NOTE — Assessment & Plan Note (Signed)
Loratadine

## 2015-05-05 ENCOUNTER — Telehealth: Payer: Self-pay | Admitting: Emergency Medicine

## 2015-05-05 MED ORDER — LEVALBUTEROL HCL 1.25 MG/3ML IN NEBU: 1.2500 mg | INHALATION_SOLUTION | Freq: Two times a day (BID) | RESPIRATORY_TRACT | Status: DC

## 2015-05-05 NOTE — Telephone Encounter (Signed)
Spoke with pt's daughter, requesting refill on pt's [facility]open[facility] to [facility] [place].   This has been sent.   Nothing further needed

## 2015-05-15 ENCOUNTER — Ambulatory Visit (INDEPENDENT_AMBULATORY_CARE_PROVIDER_SITE_OTHER): Payer: Medicare Other | Admitting: Neurology

## 2015-05-15 ENCOUNTER — Encounter: Payer: Self-pay | Admitting: Neurology

## 2015-05-15 VITALS — BP 115/90 | HR 75 | Ht 66.0 in | Wt 143.0 lb

## 2015-05-15 DIAGNOSIS — I671 Cerebral aneurysm, nonruptured: Secondary | ICD-10-CM

## 2015-05-15 DIAGNOSIS — I6302 Cerebral infarction due to thrombosis of basilar artery: Secondary | ICD-10-CM

## 2015-05-15 NOTE — Patient Instructions (Signed)
-   continue plavix and lipitor for stroke prevention. - check BP at home and record, goal BP <140.  - will repeat MRA in one month. - follow up with Dr. Delton CoombesByrum regularly - follow up with retinal specialist for right vision loss. - Follow up with your primary care physician for stroke risk factor modification. Recommend maintain blood pressure goal <140/80, diabetes with hemoglobin A1c goal below 6.5% and lipids with LDL cholesterol goal below 70 mg/dL.  - follow up in 6 months.

## 2015-05-15 NOTE — Progress Notes (Signed)
STROKE NEUROLOGY FOLLOW UP NOTE  NAME: [name] DOB: [birth year]  REASON FOR VISIT: stroke follow up HISTORY FROM: pt and chart  Today we had the pleasure of seeing Sarah [name] in follow-up at our Neurology Clinic. Pt was accompanied by daughter.   History Summary Sarah [name] is a 82 y.o. female with history ofTIAs, CHF, HTN, HLD, CAD with MI, migraine headaches, remote seizure disorder, and bilateral cavernous ICA aneurysms was admitted to [facility] on 07/08/14 for left facial droop, left arm weakness, gait imbalance and dysarthria. She did not receive IV t-PA due to improvement in deficits. MRI showed left SCA territory infarct, likely due to small vessel disease. CUS and 2D echo unremarkable. LDL 99 and A1C 6.0. She was on home ASA, so she was started on plavix and lipitor. Symptoms resolved and she was discharged to home with home health PT.  During admission, CXR showed right upper lobe mass, no further evaluation performed due to pt optioned no aggressive treatment even if found out to be malignancy. She was told to follow up with her pulmonologist [name].  MRA also showed b/l cavernous ICA aneurysm, comparing with MRA in 2013, right from 8x7 to now 10x9, left from 2-3 to now 9m. She was told to follow up outpt with neurology.  She had ED visit on 01/15/14 for right eye pain and vision loss with white out vision, neurology consulted, CT negative, MRI brain no stroke or no orbital pathology. ESR 35 ruled out GCA. She followed up with ophthalmology and gradually right eye vision loss resolved as well as eye pain. However, during the recent stroke, she had diplopia which was resolved. But then she had right eye visual decline and followed again with eye doctor, unclear etiology now and was told to follow up in 6 months.   09/08/14 follow up - the patient has been doing well. She followed up with Dr. BLamonte Sakaiand was told likely to be bronchiectasis, and favors conservative management  instead of CT chest with aggressive work up or treatment.      Her BP was high today 170/91. Her record also showing her BP at home ranging from 160-185. She is only on metoprolol 12.5 twice a day with metoprolol PRN at ALF. She stated that her BP sometimes spiked to 240. She has appointment on 09/14/2014 to see her PCP.  Still has right eye vision decreased, no change recently, ophthalmology told her to follow-up in 6 months, no diagnosis was given yet.  11/14/14 follow up - pt has been doing well. No stroke like symptoms. Her BP was high last visit, added norvasc 152mdaily. However, she developed ankle swelling, so dose decreased to 39m22maily. Her BP much better now, at home 120s. Today 127/73 in clinic. She has no complains. She has seen eye doctor for right eye vision decrease and was told to monitor in 6 months.   Interval History During the interval time, pt has been doing well. BP in good control at home with occasional 160s. Today BP 115/90. Followed with Dr. ByrLamonte Sakair bronchiectasis and put on prednisone. She will see her eye doctor tomorrow for right eye vision loss. On plavix and lipitor and she complains of weight gain.   REVIEW OF SYSTEMS: Full 14 system review of systems performed and notable only for those listed below and in HPI above, all others are negative:  Constitutional:  Unexpected weight change Cardiovascular: leg swelling Ear/Nose/Throat:  Hearing loss Skin:  Eyes:  Blurry  vision, loss of vision Respiratory:  Wheezing, cough, SOB, choking, chest tightness Gastroitestinal:  constipation Genitourinary: incontinence of bladder Hematology/Lymphatic:   Endocrine:  Musculoskeletal:   Allergy/Immunology:   Neurological:   Psychiatric: Anxiety, depression, nervous Sleep:   The following represents the patient's updated allergies and side effects list: Allergies  Allergen Reactions  . Codeine Anaphylaxis  . Hydrocodone Nausea And Vomiting and Other (See Comments)     SYNCOPE AND BRADYCARDIA  . Isosorbide Nitrate Other (See Comments)    BRADYCARDIA  . Oxycodone Palpitations    Rapid heart beat  . Clarithromycin Nausea And Vomiting  . Fiorinal [Butalbital-Aspirin-Caffeine] Swelling and Other (See Comments)    Facial swelling   . Levofloxacin Nausea And Vomiting and Other (See Comments)    SYNCOPE ALSO  . Augmentin [Amoxicillin-Pot Clavulanate] Diarrhea    The neurologically relevant items on the patient's problem list were reviewed on today's visit.  Neurologic Examination  A problem focused neurological exam (12 or more points of the single system neurologic examination, vital signs counts as 1 point, cranial nerves count for 8 points) was performed.  Blood pressure 115/90, pulse 75, height '5\' 6"'  (1.676 m), weight 143 lb (64.864 kg), last menstrual period 01/14/1970.  General - Well nourished, well developed, in no apparent distress.  Ophthalmologic - Fundi not visualized due to eye movement.  Cardiovascular - Regular rate and rhythm with no murmur.  Mental Status -  Level of arousal and orientation to time, place, and person were intact. Language including expression, naming, repetition, comprehension was assessed and found intact. Concentration and attention intact Memory registration 3/3, delayed recall 1/3. Fund of Knowledge was assessed and was intact.  Cranial Nerves II - XII - II - Visual field intact OU. Right eye visual acuity decrease to finger waving. III, IV, VI - Extraocular movements intact. V - Facial sensation intact bilaterally. VII - Facial movement intact bilaterally. VIII - Hearing & vestibular intact bilaterally. X - Palate elevates symmetrically. XI - Chin turning & shoulder shrug intact bilaterally. XII - Tongue protrusion intact.  Motor Strength - The patient's strength was normal in all extremities and pronator drift was absent.  Bulk was normal and fasciculations were absent.   Motor Tone - Muscle tone was  assessed at the neck and appendages and was normal.  Reflexes - The patient's reflexes were 1+ in all extremities and she had no pathological reflexes.  Sensory - Light touch, temperature/pinprick were assessed and were normal.    Coordination - The patient had normal movements in the hands with no ataxia or dysmetria.  Tremor was absent.  Gait and Station - walk with walker, stooped posturing, small stride, slow, but steady, no fall.  Data reviewed: I personally reviewed the images and agree with the radiology interpretations.  Dg Chest 2 View 07/08/2014  1. Cardiomegaly without edema.  2. Left lower lobe infiltrate, likely infectious.  3. Suspicious right upper lobe mass. Further evaluation with chest CT is recommended. Contrast administration is recommended unless contraindicated.   Ct Head Wo Contrast 07/08/2014  No acute intracranial findings. Chronic changes as described.   MRI / MRA Brain Wo Contrast 07/08/2014  1. 2 cm acute left cerebellar SCA territory infarct.  2. Mild chronic small vessel ischemic disease.  3. Large left mastoid effusion.  4. No major intracranial arterial occlusion or significant proximal stenosis.  5. Bilateral cavernous carotid aneurysms, slightly increased in size since 2013 MRA.  The aneurysm on the right measures approximately 10 x 9 mm (  previously 8 x 7 mm on prior MRA). Left cavernous carotid aneurysm measures 4 mm (previously 2-3 mm).   2D echo - - Left ventricle: The cavity size was normal. Systolic function was vigorous. The estimated ejection fraction was in the range of 65% to 70%. Wall motion was normal; there were no regional wall motion abnormalities. Features are consistent with a pseudonormal left ventricular filling pattern, with concomitant abnormal relaxation and increased filling pressure (grade 2 diastolic dysfunction). There was no evidence of elevated  ventricular filling pressure by Doppler parameters. - Aortic valve: Trileaflet; mildly thickened leaflets. There was no regurgitation. - Aortic root: The aortic root was normal in size. - Mitral valve: Structurally normal valve. There was mild regurgitation. - Left atrium: The atrium was mildly dilated. - Right ventricle: Systolic function was normal. - Tricuspid valve: There was trivial regurgitation. - Pulmonic valve: There was no regurgitation. - Pulmonary arteries: Systolic pressure was within the normal range. - Inferior vena cava: The vessel was normal in size. - Pericardium, extracardiac: There was no pericardial effusion.  CUS - Bilateral: 1-39% ICA stenosis. Vertebral artery flow is antegrade.  Component     Latest Ref Rng 07/09/2014  Cholesterol     0 - 200 mg/dL 179  Triglycerides     <150 mg/dL 66  HDL Cholesterol     >40 mg/dL 67  Total CHOL/HDL Ratio      2.7  VLDL     0 - 40 mg/dL 13  LDL (calc)     0 - 99 mg/dL 99  Hemoglobin A1C     4.8 - 5.6 % 6.0 (H)  Mean Plasma Glucose      126    Assessment: As you may recall, she is a 82 y.o. Caucasian female with PMH of TIAs, CHF, HTN, HLD, CAD with MI, migraine headaches, remote seizure disorder, and bilateral cavernous ICA aneurysms was admitted to [facility] on 07/08/14 for left SCA territory infarct, likely due to small vessel disease. CUS and 2D echo unremarkable. LDL 99 and A1C 6.0. She was discharged on plavix and lipitor. Symptoms resolved. She still has right eye vision decrease, not able to be explained by stroke, she is following with her ophthalmologist. MRA during admission again showing bilateral cavernous ICA aneurysm, slightly bigger than 3 years ago. Currently, patient option conservative management for the aneurysms. However, her BP was high at visit, put on amlodipine 10 mg daily, later decreased to 31m due to ankle swelling. BP better controlled. Will need to repeat MRA for aneurysm monitoring  Plan:    - continue plavix and lipitor for stroke prevention. - check BP at home and record, goal BP <140.  - will repeat MRA in one month. - follow up with Dr. BLamonte Sakairegularly - follow up with retinal specialist for right vision loss. - Follow up with your primary care physician for stroke risk factor modification. Recommend maintain blood pressure goal <140/80, diabetes with hemoglobin A1c goal below 6.5% and lipids with LDL cholesterol goal below 70 mg/dL.  - follow up in 6 months.  I spent more than 25 minutes of face to face time with the patient. Greater than 50% of time was spent in counseling and coordination of care. We discussed aneurysm monitoring with MRA, follow up with Dr. BLamonte Sakaiand Dr. DEstanislado Pandy    Orders Placed This Encounter  Procedures  . MR MRA HEAD WO CONTRAST    Standing Status: Future     Number of Occurrences:  Standing Expiration Date: 07/16/2016    Scheduling Instructions:     Schedule in one month. Thanks.    Order Specific Question:  Reason for Exam (SYMPTOM  OR DIAGNOSIS REQUIRED)    Answer:  aneurysm monitoring    Order Specific Question:  Preferred imaging location?    Answer:  Internal    Order Specific Question:  Does the patient have a pacemaker or implanted devices?    Answer:  No    Order Specific Question:  What is the patient's sedation requirement?    Answer:  No Sedation    No orders of the defined types were placed in this encounter.    Patient Instructions  - continue plavix and lipitor for stroke prevention. - check BP at home and record, goal BP <140.  - will repeat MRA in one month. - follow up with [name] regularly - follow up with retinal specialist for right vision loss. - Follow up with your primary care physician for stroke risk factor modification. Recommend maintain blood pressure goal <140/80, diabetes with hemoglobin A1c goal below 6.5% and lipids with LDL cholesterol goal below 70 mg/dL.  - follow up in 6  months.    Rosalin Hawking, MD PhD [facility] [address] [place], [place] [address] [phone]

## 2015-05-19 ENCOUNTER — Telehealth: Payer: Self-pay | Admitting: *Deleted

## 2015-05-19 NOTE — Telephone Encounter (Signed)
[facility] Key: [id number] Submitted for review.  [facility] (p) [phone]  (f) [phone]  Will await response.  [facility] Pt ID: [id number]

## 2015-05-22 NOTE — Telephone Encounter (Signed)
Received fax stating that Levalbuterol neb is not covered under pt's Part D which is what the pharmacy ran it under. [facility] states that it needs to be ran under Part D. Called [facility] and the pharmacist states that they can't do Medicare Part B. They are asking if you want to send something different.

## 2015-05-25 NOTE — Telephone Encounter (Signed)
Is there a way to continue to prescribe her xopenex?

## 2015-05-26 NOTE — Telephone Encounter (Signed)
Sarah [name] with [facility] has called [place] asking if provider is going to appeal the Xopenex. States pt has paid out of pocket for the last rx.   Will forward to [place] for her to follow up.

## 2015-05-31 ENCOUNTER — Ambulatory Visit: Payer: Self-pay | Admitting: Emergency Medicine

## 2015-05-31 DIAGNOSIS — H353122 Nonexudative age-related macular degeneration, left eye, intermediate dry stage: Secondary | ICD-10-CM | POA: Diagnosis not present

## 2015-05-31 DIAGNOSIS — H353211 Exudative age-related macular degeneration, right eye, with active choroidal neovascularization: Secondary | ICD-10-CM | POA: Diagnosis not present

## 2015-05-31 DIAGNOSIS — H43813 Vitreous degeneration, bilateral: Secondary | ICD-10-CM | POA: Diagnosis not present

## 2015-06-16 DIAGNOSIS — E559 Vitamin D deficiency, unspecified: Secondary | ICD-10-CM | POA: Diagnosis not present

## 2015-06-16 DIAGNOSIS — F339 Major depressive disorder, recurrent, unspecified: Secondary | ICD-10-CM | POA: Diagnosis not present

## 2015-06-16 DIAGNOSIS — I10 Essential (primary) hypertension: Secondary | ICD-10-CM | POA: Diagnosis not present

## 2015-06-16 DIAGNOSIS — Z79899 Other long term (current) drug therapy: Secondary | ICD-10-CM | POA: Diagnosis not present

## 2015-06-20 ENCOUNTER — Other Ambulatory Visit: Payer: Self-pay

## 2015-06-26 DIAGNOSIS — D649 Anemia, unspecified: Secondary | ICD-10-CM | POA: Diagnosis not present

## 2015-06-27 DIAGNOSIS — H353211 Exudative age-related macular degeneration, right eye, with active choroidal neovascularization: Secondary | ICD-10-CM | POA: Diagnosis not present

## 2015-07-04 ENCOUNTER — Ambulatory Visit: Payer: Medicare Other | Admitting: Psychiatry

## 2015-07-13 ENCOUNTER — Encounter: Payer: Self-pay | Admitting: Emergency Medicine

## 2015-07-13 ENCOUNTER — Ambulatory Visit (INDEPENDENT_AMBULATORY_CARE_PROVIDER_SITE_OTHER): Payer: Medicare Other | Admitting: Emergency Medicine

## 2015-07-13 ENCOUNTER — Ambulatory Visit (INDEPENDENT_AMBULATORY_CARE_PROVIDER_SITE_OTHER)
Admission: RE | Admit: 2015-07-13 | Discharge: 2015-07-13 | Disposition: A | Discharge status: Home / Self Care | Payer: Medicare Other | Source: Ambulatory Visit | Attending: Emergency Medicine | Admitting: Emergency Medicine

## 2015-07-13 VITALS — BP 122/74 | HR 86 | Temp 98.2°F | Ht 67.0 in | Wt 144.4 lb

## 2015-07-13 DIAGNOSIS — J471 Bronchiectasis with (acute) exacerbation: Secondary | ICD-10-CM

## 2015-07-13 DIAGNOSIS — I6302 Cerebral infarction due to thrombosis of basilar artery: Secondary | ICD-10-CM

## 2015-07-13 DIAGNOSIS — R05 Cough: Secondary | ICD-10-CM | POA: Diagnosis not present

## 2015-07-13 MED ORDER — PREDNISONE 10 MG PO TABS: ORAL_TABLET | ORAL | Status: DC

## 2015-07-13 MED ORDER — DOXYCYCLINE HYCLATE 100 MG PO TABS: 100.0000 mg | ORAL_TABLET | Freq: Two times a day (BID) | ORAL | Status: DC

## 2015-07-13 NOTE — Assessment & Plan Note (Addendum)
With an acute flare, purulent bronchitis. Need to rule out pneumonia so we'll check a chest x-ray today. She is to be treated with antibiotics but has had difficulty tolerating several, note allergy list. Based on the allergy profile I'll treat her with doxycycline 100 mg twice a day. Pred taper. Recommended that she time her flutter valve with the xopenex and when she wants to clear secretions.   Xopenex neb x 1 now.

## 2015-07-13 NOTE — Addendum Note (Signed)
Addended by: Maxwell MarionBLANKENSHIP, MARGIE A on: 07/13/2015 02:59 PM   Modules accepted: Orders

## 2015-07-13 NOTE — Progress Notes (Signed)
Subjective:    Patient ID: Sarah FlavorsGertrude [name], female    DOB: 1921/04/25, 82 y.o.   MRN: [id number]  HPI 82 yo never smoker, hx remote Legionella PNA, diagnosed with suspected asthma about 10 years ago. PFT's 7/10 with mixed disease, mild AFL. Formerly treated with Advair and albuterol, stopped when she moved to [place] in 2008. Stayed on Singulair. At original consult we d/c'd ACE-I. Started on Symbicort.    ROV 12/01/14 --  History of chronic cough, bronchiectasis with parabronchovascular micronodular disease consistent with mycobacterial disease. We have never treated this.  She had been doing well until about 5 days ago, then she developed weakness, aches, flu-like sx, fever. Then cough. Started pred and azithro. Her sputum has become purulent.  Her xopenex use has increased over the last month.  She is on loratdine.            ROV 01/31/15 --   History of bronchiectasis and chronic cough. Suspected mycobacterial disease although has never fully been established. At her last visit she had an apparent acute bronchitis. She was treated with prednisone, azithromycin, Augmentin.  She had to stop the augmentin due to side effects. She feels that her cough is better. She improved late December after being treated x 2 w abx. Her energy is improved. She does still have some cough with dark mucous plugs. She is using albuterol nebs with some benefit.     ROV 03/21/15 -- patient with a history of bronchiectasis and chronic cough. She also has suspected mycobacterial colonization although we've never proven this definitively. I treated her 2/20 with doxycycline for a suspected bronchitis. She was then seen in our office on 221 and started on prednisone as well. She returns today describing weakness, exertional fatigue and dyspnea, feels like she could faint. She is doing some coughing - less after the abx but still occuring. Has mucous every day, coughs up clear, occasional darker plugs. She notes that the fatigue  relates to her discontinuation of her prednisone. She has had sx suggestive of adrenal insufficiency. Occasional tearfulness today.    ROV 05/04/15 -- patient with a history of chronic cough, bronchiectasis with probable mycobacterial disease although  never been fully diagnosed. She experienced some weakness and more dyspnea after her prednisone was stopped so I started her back on 5 mg daily at our last visit. She was subsequently seen in our office for a possible acute bronchiectasis flare. She recovered from the flare, does believe that the pred has helped her energy, her depression.  She is having some increased mucous and cough, prod of large volume clear mucous. On loratadine. Uses flutter intermittently.    ROV 07/13/15 -- follow-up visit for bronchiectasis and suspected mycobacterial disease (never fully diagnosed) with associated chronic cough. She has a history of chronic prednisone use and is currently on 5 mg daily. She has been using flutter valve, Xopenex nebulizer twice a day. She is also on loratadine once a day.  She has been having more cough, more dyspnea. At least twice a day she is having fits of cough prod of pale yellow phlegm.      Review of Systems As above      Objective:   Physical Exam  Filed Vitals:   07/13/15 1421  BP: 122/74  Pulse: 86  Temp: 98.2 F (36.8 C)  TempSrc: Oral  Height: 5\' 7"  (1.702 m)  Weight: 144 lb 6.4 oz (65.499 kg)  SpO2: 94%    GEN: A/Ox3; pleasant , NAD,  elderly and thin   HEENT:  [place]/AT,  NOSE-clear, THROAT-clear, no lesions, no postnasal drip or exudate noted.   NECK:  Supple w/ fair ROM; no JVD; normal carotid impulses w/o bruits; no thyromegaly or nodules palpated; no lymphadenopathy.  RESP  Few B insp crackles, more on the L , no dullness to percussion  CARD:  RRR, no m/r/g  , no peripheral edema, pulses intact, no cyanosis or clubbing.  Musco: Warm bil, no deformities or joint swelling noted.   Neuro: alert, no focal deficits  noted.    Skin: Warm, no lesions or rashes      Assessment & Plan:  Bronchiectasis with acute exacerbation (HCC) With an acute flare, purulent bronchitis. Need to rule out pneumonia so we'll check a chest x-ray today. She is to be treated with antibiotics but has had difficulty tolerating several, note allergy list. Based on the allergy profile I'll treat her with doxycycline 100 mg twice a day. Pred taper. Recommended that she time her flutter valve with the xopenex and when she wants to clear secretions.   Xopenex neb x 1 now.    Levy Pupaobert Jezebelle Ledwell, MD, PhD 07/13/2015, 2:40 PM [facility] [phone] or if no answer [phone]

## 2015-07-13 NOTE — Addendum Note (Signed)
Addended by: Maxwell MarionBLANKENSHIP, MARGIE A on: 07/13/2015 03:09 PM   Modules accepted: Orders, Medications

## 2015-07-13 NOTE — Addendum Note (Signed)
Addended by: Maxwell MarionBLANKENSHIP, Karanveer Ramakrishnan A on: 07/13/2015 02:51 PM   Modules accepted: Orders

## 2015-07-13 NOTE — Patient Instructions (Addendum)
We will treat you with doxycycline 100mg  twice a day for 7 days.  Please continue your xopenex nebulizers twice a day  Please continue your flutter valve 1 to 2 times a day Take prednisone taper as directed, back down to 5mg  daily.  CXR today.  Keep your July follow up appointment as planned

## 2015-07-26 DIAGNOSIS — H353211 Exudative age-related macular degeneration, right eye, with active choroidal neovascularization: Secondary | ICD-10-CM | POA: Diagnosis not present

## 2015-07-26 DIAGNOSIS — H43813 Vitreous degeneration, bilateral: Secondary | ICD-10-CM | POA: Diagnosis not present

## 2015-07-26 DIAGNOSIS — H353122 Nonexudative age-related macular degeneration, left eye, intermediate dry stage: Secondary | ICD-10-CM | POA: Diagnosis not present

## 2015-08-08 ENCOUNTER — Ambulatory Visit (INDEPENDENT_AMBULATORY_CARE_PROVIDER_SITE_OTHER): Payer: Medicare Other | Admitting: Emergency Medicine

## 2015-08-08 ENCOUNTER — Encounter: Payer: Self-pay | Admitting: Emergency Medicine

## 2015-08-08 DIAGNOSIS — R059 Cough, unspecified: Secondary | ICD-10-CM

## 2015-08-08 DIAGNOSIS — I6302 Cerebral infarction due to thrombosis of basilar artery: Secondary | ICD-10-CM | POA: Diagnosis not present

## 2015-08-08 DIAGNOSIS — R05 Cough: Secondary | ICD-10-CM

## 2015-08-08 DIAGNOSIS — J309 Allergic rhinitis, unspecified: Secondary | ICD-10-CM | POA: Diagnosis not present

## 2015-08-08 MED ORDER — PANTOPRAZOLE SODIUM 40 MG PO TBEC
40.0000 mg | DELAYED_RELEASE_TABLET | Freq: Every day | ORAL | 5 refills | Status: DC
Start: 2015-08-08 — End: 2015-08-08

## 2015-08-08 MED ORDER — PANTOPRAZOLE SODIUM 40 MG PO TBEC: DELAYED_RELEASE_TABLET | ORAL | 5 refills | Status: DC

## 2015-08-08 NOTE — Progress Notes (Signed)
Subjective:    Patient ID: Sarah [name], female    DOB: 06-18-1921, 82 y.o.   MRN: [id number]  HPI 82 yo never smoker, hx remote Legionella PNA, diagnosed with suspected asthma over 10 years ago. PFT's 7/10 with mixed disease, mild AFL. Formerly treated with Advair and albuterol, stopped when she moved to [place] in 2008. Stayed on Singulair. At original consult we d/c'd ACE-I. Started on Symbicort.    ROV 12/01/14 --  History of chronic cough, bronchiectasis with parabronchovascular micronodular disease consistent with mycobacterial disease. We have never treated this.  She had been doing well until about 5 days ago, then she developed weakness, aches, flu-like sx, fever. Then cough. Started pred and azithro. Her sputum has become purulent.  Her xopenex use has increased over the last month.  She is on loratdine.            ROV 01/31/15 --   History of bronchiectasis and chronic cough. Suspected mycobacterial disease although has never fully been established. At her last visit she had an apparent acute bronchitis. She was treated with prednisone, azithromycin, Augmentin.  She had to stop the augmentin due to side effects. She feels that her cough is better. She improved late December after being treated x 2 w abx. Her energy is improved. She does still have some cough with dark mucous plugs. She is using albuterol nebs with some benefit.     ROV 03/21/15 -- patient with a history of bronchiectasis and chronic cough. She also has suspected mycobacterial colonization although we've never proven this definitively. I treated her 2/20 with doxycycline for a suspected bronchitis. She was then seen in our office on 2/21 and started on prednisone as well. She returns today describing weakness, exertional fatigue and dyspnea, feels like she could faint. She is doing some coughing - less after the abx but still occuring. Has mucous every day, coughs up clear, occasional darker plugs. She notes that the fatigue  relates to her discontinuation of her prednisone. She has had sx suggestive of adrenal insufficiency. Occasional tearfulness today.    ROV 05/04/15 -- patient with a history of chronic cough, bronchiectasis with probable mycobacterial disease although  never been fully diagnosed. She experienced some weakness and more dyspnea after her prednisone was stopped so I started her back on 5 mg daily at our last visit. She was subsequently seen in our office for a possible acute bronchiectasis flare. She recovered from the flare, does believe that the pred has helped her energy, her depression.  She is having some increased mucous and cough, prod of large volume clear mucous. On loratadine. Uses flutter intermittently.    ROV 07/13/15 -- follow-up visit for bronchiectasis and suspected mycobacterial disease (never fully diagnosed) with associated chronic cough. She has a history of chronic prednisone use and is currently on 5 mg daily. She has been using flutter valve, Xopenex nebulizer twice a day. She is also on loratadine once a day.  She has been having more cough, more dyspnea. At least twice a day she is having fits of cough prod of pale yellow phlegm.     ROV 08/08/15 -- Return visit for follow-up of the patient's bronchiectasis, upper airway irritation and cough, probable superimposed asthma. At her last visit I treated her for an acute exacerbation of her bronchiectasis and a purulent bronchitis with antibiotics and a prednisone taper. She is usually on chronic prednisone 5 mg daily. She did improve for a while. She has restarted freq cough.  She believes that she can relate the cough some to her Po intake, no overt aspiration but she does see LPR. She has a hiatal hernia, is not on a PPI.  She is using xopenex nebs bid. Her dyspnea is better since the pred taper.    Review of Systems As above      Objective:   Physical Exam  Vitals:   08/08/15 1128  BP: 122/82  Pulse: 78  SpO2: 95%  Weight: 146 lb  (66.2 kg)    GEN: A/Ox3; pleasant , NAD, elderly and thin    HEENT:  [place]/AT,  NOSE-clear, THROAT-clear, no lesions, no postnasal drip or exudate noted.   NECK:  Supple w/ fair ROM; no JVD; normal carotid impulses w/o bruits; no thyromegaly or nodules palpated; no lymphadenopathy.    RESP  Few B insp crackles, more on the L , no wheeze  CARD:  RRR, no m/r/g  , no peripheral edema, pulses intact, no cyanosis or clubbing.  Musco: Warm bil, no deformities or joint swelling noted.   Neuro: alert, no focal deficits noted.    Skin: Warm, no lesions or rashes      Assessment & Plan:  Asthma Currently using Xopenex nebulized twice a day. Consider adding back a LABAICS although I'm concerned that it has exacerbated her cough in the past  Allergic rhinitis Continue current medications  Cough She complains of intermittent GERD symptoms. She has LPR and has sometimes had material come up into her esophagus and throat that initiated cough. I believe she needs to be on a PPI, we will start pantoprazole (substitute alternative if this is not covered by her insurance). Continue current regimen for allergic rhinitis, continue her current bronchodilators  Bronchiectasis with acute exacerbation (HCC) No evidence of acute exacerbation at this time. We will follow her clinically. Discuss the timing of repeat imaging at our next visit.  Levy Pupa, MD, PhD 08/08/2015, 11:46 AM [facility] [phone] or if no answer [phone]

## 2015-08-08 NOTE — Assessment & Plan Note (Signed)
She complains of intermittent GERD symptoms. She has LPR and has sometimes had material come up into her esophagus and throat that initiated cough. I believe she needs to be on a PPI, we will start pantoprazole (substitute alternative if this is not covered by her insurance). Continue current regimen for allergic rhinitis, continue her current bronchodilators

## 2015-08-08 NOTE — Assessment & Plan Note (Signed)
Currently using Xopenex nebulized twice a day. Consider adding back a LABAICS although I'm concerned that it has exacerbated her cough in the past

## 2015-08-08 NOTE — Assessment & Plan Note (Signed)
No evidence of acute exacerbation at this time. We will follow her clinically. Discuss the timing of repeat imaging at our next visit.

## 2015-08-08 NOTE — Assessment & Plan Note (Signed)
Continue current medications. 

## 2015-08-08 NOTE — Patient Instructions (Addendum)
Please start taking protonix 40mg  daily, either one hour before or one hour after eating.  Please continue your claritin and prednisone as you are taking them  Continue your xopenex twice a day We will consider adding a different inhaler at some point in the future depending on how you are doing.  Follow with Dr Delton Coombes in 2 months or sooner if you have any problems.

## 2015-08-22 ENCOUNTER — Ambulatory Visit (INDEPENDENT_AMBULATORY_CARE_PROVIDER_SITE_OTHER): Payer: Medicare Other | Admitting: Psychiatry

## 2015-08-22 DIAGNOSIS — F410 Panic disorder [episodic paroxysmal anxiety] without agoraphobia: Secondary | ICD-10-CM | POA: Diagnosis not present

## 2015-08-29 ENCOUNTER — Other Ambulatory Visit: Payer: Self-pay | Admitting: Emergency Medicine

## 2015-09-06 DIAGNOSIS — H353122 Nonexudative age-related macular degeneration, left eye, intermediate dry stage: Secondary | ICD-10-CM | POA: Diagnosis not present

## 2015-09-06 DIAGNOSIS — H43813 Vitreous degeneration, bilateral: Secondary | ICD-10-CM | POA: Diagnosis not present

## 2015-09-06 DIAGNOSIS — H353211 Exudative age-related macular degeneration, right eye, with active choroidal neovascularization: Secondary | ICD-10-CM | POA: Diagnosis not present

## 2015-09-19 ENCOUNTER — Encounter: Payer: Self-pay | Admitting: Emergency Medicine

## 2015-09-19 ENCOUNTER — Ambulatory Visit (INDEPENDENT_AMBULATORY_CARE_PROVIDER_SITE_OTHER): Payer: Medicare Other | Admitting: Emergency Medicine

## 2015-09-19 ENCOUNTER — Telehealth: Payer: Self-pay | Admitting: Emergency Medicine

## 2015-09-19 VITALS — BP 110/62 | HR 73 | Ht 67.0 in | Wt 146.0 lb

## 2015-09-19 DIAGNOSIS — J470 Bronchiectasis with acute lower respiratory infection: Secondary | ICD-10-CM | POA: Diagnosis not present

## 2015-09-19 DIAGNOSIS — I6302 Cerebral infarction due to thrombosis of basilar artery: Secondary | ICD-10-CM | POA: Diagnosis not present

## 2015-09-19 MED ORDER — LEVOFLOXACIN 500 MG PO TABS: 500.0000 mg | ORAL_TABLET | Freq: Every day | ORAL | 0 refills | Status: DC

## 2015-09-19 NOTE — Progress Notes (Signed)
Subjective:    Patient ID: Sarah [name], female    DOB: 06-18-1921, 82 y.o.   MRN: [id number]  HPI 82 yo never smoker, hx remote Legionella PNA, diagnosed with suspected asthma over 10 years ago. PFT's 7/10 with mixed disease, mild AFL. Formerly treated with Advair and albuterol, stopped when she moved to [place] in 2008. Stayed on Singulair. At original consult we d/c'd ACE-I. Started on Symbicort.    ROV 12/01/14 --  History of chronic cough, bronchiectasis with parabronchovascular micronodular disease consistent with mycobacterial disease. We have never treated this.  She had been doing well until about 5 days ago, then she developed weakness, aches, flu-like sx, fever. Then cough. Started pred and azithro. Her sputum has become purulent.  Her xopenex use has increased over the last month.  She is on loratdine.            ROV 01/31/15 --   History of bronchiectasis and chronic cough. Suspected mycobacterial disease although has never fully been established. At her last visit she had an apparent acute bronchitis. She was treated with prednisone, azithromycin, Augmentin.  She had to stop the augmentin due to side effects. She feels that her cough is better. She improved late December after being treated x 2 w abx. Her energy is improved. She does still have some cough with dark mucous plugs. She is using albuterol nebs with some benefit.     ROV 03/21/15 -- patient with a history of bronchiectasis and chronic cough. She also has suspected mycobacterial colonization although we've never proven this definitively. I treated her 2/20 with doxycycline for a suspected bronchitis. She was then seen in our office on 2/21 and started on prednisone as well. She returns today describing weakness, exertional fatigue and dyspnea, feels like she could faint. She is doing some coughing - less after the abx but still occuring. Has mucous every day, coughs up clear, occasional darker plugs. She notes that the fatigue  relates to her discontinuation of her prednisone. She has had sx suggestive of adrenal insufficiency. Occasional tearfulness today.    ROV 05/04/15 -- patient with a history of chronic cough, bronchiectasis with probable mycobacterial disease although  never been fully diagnosed. She experienced some weakness and more dyspnea after her prednisone was stopped so I started her back on 5 mg daily at our last visit. She was subsequently seen in our office for a possible acute bronchiectasis flare. She recovered from the flare, does believe that the pred has helped her energy, her depression.  She is having some increased mucous and cough, prod of large volume clear mucous. On loratadine. Uses flutter intermittently.    ROV 07/13/15 -- follow-up visit for bronchiectasis and suspected mycobacterial disease (never fully diagnosed) with associated chronic cough. She has a history of chronic prednisone use and is currently on 5 mg daily. She has been using flutter valve, Xopenex nebulizer twice a day. She is also on loratadine once a day.  She has been having more cough, more dyspnea. At least twice a day she is having fits of cough prod of pale yellow phlegm.     ROV 08/08/15 -- Return visit for follow-up of the patient's bronchiectasis, upper airway irritation and cough, probable superimposed asthma. At her last visit I treated her for an acute exacerbation of her bronchiectasis and a purulent bronchitis with antibiotics and a prednisone taper. She is usually on chronic prednisone 5 mg daily. She did improve for a while. She has restarted freq cough.  She believes that she can relate the cough some to her Po intake, no overt aspiration but she does see LPR. She has a hiatal hernia, is not on a PPI.  She is using xopenex nebs bid. Her dyspnea is better since the pred taper.   ROV 09/19/15 -- Patient has a history of chronic cough with upper airway irritation, bronchiectasis and suspected superimposed asthma. She is on  chronic prednisone 5 mg daily. At her last visit we added Protonix 40 mg daily to see if there was a possible GERD contribution to her cough. She believes that it may have helped her reflux and also her cough some. She does still have cough with spells about a day. She sometimes uses flutter valve. Her sputum is dark gray to yellow. Her last abx were 6-8 weeks ago per her report.    Review of Systems As above      Objective:   Physical Exam  Vitals:   09/19/15 1155  BP: 110/62  Pulse: 73  SpO2: 93%  Weight: 146 lb (66.2 kg)  Height: 5\' 7"  (1.702 m)    GEN: A/Ox3; pleasant , NAD, elderly and thin    HEENT:  [place]/AT,  NOSE-clear, THROAT-clear, no lesions, no postnasal drip or exudate noted.   NECK:  No stridor  RESP  Few B insp crackles, some exp squeaks  CARD:  RRR, no m/r/g  , no peripheral edema, pulses intact, no cyanosis or clubbing.  Musco: Warm bil, no deformities or joint swelling noted.   Neuro: alert, no focal deficits noted.    Skin: Warm, no lesions or rashes      Assessment & Plan:  BRONCHIECTASIS Her cough appears to be benefited some from the addition of Protonix. She does still make significantly purulent mucus at least twice a day in large volume. I believe we should treat her with a course of Levaquin. She has a Levaquin intolerance but not a true allergy. She believes that she can tolerate taking the medication again and we will try this. If she has side effects then we will change to an alternative. Continue flutter valve, Xopenex.  Levy Pupaobert Theona Muhs, MD, PhD 09/19/2015, 12:18 PM [facility] [phone] or if no answer [phone]

## 2015-09-19 NOTE — Patient Instructions (Addendum)
Please take levaquin 500mg  daily for the next 10 days.  Continue protonix 40mg  daily Continue prednisone 5mg  daily Continue your xopenex nebulizer as needed, at least 2 times a day.  Use your flutter valve daily.  Follow with Dr Delton CoombesByrum in 2 months or sooner if you have any problems.

## 2015-09-19 NOTE — Assessment & Plan Note (Signed)
Her cough appears to be benefited some from the addition of Protonix. She does still make significantly purulent mucus at least twice a day in large volume. I believe we should treat her with a course of Levaquin. She has a Levaquin intolerance but not a true allergy. She believes that she can tolerate taking the medication again and we will try this. If she has side effects then we will change to an alternative. Continue flutter valve, Xopenex.

## 2015-09-19 NOTE — Telephone Encounter (Signed)
Called and spoke with pts daughter and she stated that the pt does not use the RX care anymore and this has been removed from her pharmacy list.  levaquin has been sent in to the pharmacy per daughters request.  Nothing further is needed.

## 2015-09-26 DIAGNOSIS — J471 Bronchiectasis with (acute) exacerbation: Secondary | ICD-10-CM | POA: Diagnosis not present

## 2015-09-26 DIAGNOSIS — I639 Cerebral infarction, unspecified: Secondary | ICD-10-CM | POA: Diagnosis not present

## 2015-09-26 DIAGNOSIS — E039 Hypothyroidism, unspecified: Secondary | ICD-10-CM | POA: Diagnosis not present

## 2015-09-26 DIAGNOSIS — Z79899 Other long term (current) drug therapy: Secondary | ICD-10-CM | POA: Diagnosis not present

## 2015-09-26 DIAGNOSIS — N183 Chronic kidney disease, stage 3 (moderate): Secondary | ICD-10-CM | POA: Diagnosis not present

## 2015-09-26 DIAGNOSIS — D509 Iron deficiency anemia, unspecified: Secondary | ICD-10-CM | POA: Diagnosis not present

## 2015-09-26 DIAGNOSIS — F339 Major depressive disorder, recurrent, unspecified: Secondary | ICD-10-CM | POA: Diagnosis not present

## 2015-09-26 DIAGNOSIS — F410 Panic disorder [episodic paroxysmal anxiety] without agoraphobia: Secondary | ICD-10-CM | POA: Diagnosis not present

## 2015-10-10 ENCOUNTER — Ambulatory Visit (INDEPENDENT_AMBULATORY_CARE_PROVIDER_SITE_OTHER): Payer: Medicare Other | Admitting: Psychiatry

## 2015-10-10 DIAGNOSIS — F4322 Adjustment disorder with anxiety: Secondary | ICD-10-CM | POA: Diagnosis not present

## 2015-10-31 DIAGNOSIS — H43813 Vitreous degeneration, bilateral: Secondary | ICD-10-CM | POA: Diagnosis not present

## 2015-10-31 DIAGNOSIS — H353212 Exudative age-related macular degeneration, right eye, with inactive choroidal neovascularization: Secondary | ICD-10-CM | POA: Diagnosis not present

## 2015-10-31 DIAGNOSIS — H353122 Nonexudative age-related macular degeneration, left eye, intermediate dry stage: Secondary | ICD-10-CM | POA: Diagnosis not present

## 2015-11-06 ENCOUNTER — Telehealth: Payer: Self-pay | Admitting: Emergency Medicine

## 2015-11-06 MED ORDER — PREDNISONE 10 MG PO TABS: ORAL_TABLET | ORAL | 0 refills | Status: DC

## 2015-11-06 MED ORDER — LEVOFLOXACIN 500 MG PO TABS: 500.0000 mg | ORAL_TABLET | Freq: Every day | ORAL | 0 refills | Status: DC

## 2015-11-06 NOTE — Telephone Encounter (Signed)
Spoke with pt's daughter, Johnny BridgeMartha. She is aware of RB's recommendations. [facility] [address]. Nothing further was needed.

## 2015-11-06 NOTE — Telephone Encounter (Signed)
Spoke with pt's daughter. Pt c/o increased sob, prod cough (unusre of color), possible low grade temp, no energy.  Using Xopenex HHN 2-3 times/day.Alos on Prednisone 5mg /day maintenance.  Pt 82 yo and hard to bring in to office.  RB has offered to call in meds in the past.  Levaquin worked well the last time.  Please advise

## 2015-11-06 NOTE — Telephone Encounter (Signed)
Sarah [name]

## 2015-11-06 NOTE — Telephone Encounter (Signed)
Patient's daughter, Johnny BridgeMartha, called again and stated if we send over an RX to please send it to [facility] at [address]. I told her the message has been sent to Dr. Delton CoombesByrum and nurse will be calling her, her CB is [phone].

## 2015-11-06 NOTE — Telephone Encounter (Signed)
Please give her levaquin  qd x 5 days Prednisone Take  daily for 3 days, then  daily for 3 days, then  daily for 3 days, then  daily for 3 days, then stop Have her call in not improving

## 2015-11-06 NOTE — Telephone Encounter (Signed)
RB please advise. Thanks.  

## 2015-11-21 ENCOUNTER — Encounter: Payer: Self-pay | Admitting: Emergency Medicine

## 2015-11-21 ENCOUNTER — Ambulatory Visit (INDEPENDENT_AMBULATORY_CARE_PROVIDER_SITE_OTHER): Payer: Medicare Other | Admitting: Emergency Medicine

## 2015-11-21 DIAGNOSIS — Z23 Encounter for immunization: Secondary | ICD-10-CM

## 2015-11-21 DIAGNOSIS — I6302 Cerebral infarction due to thrombosis of basilar artery: Secondary | ICD-10-CM

## 2015-11-21 DIAGNOSIS — J479 Bronchiectasis, uncomplicated: Secondary | ICD-10-CM | POA: Diagnosis not present

## 2015-11-21 NOTE — Patient Instructions (Addendum)
Please continue your flutter valve and Xopenex nebulized medication twice a day.  Continue your protonix daily.  Follow with Dr Delton CoombesByrum in 3 months or sooner if you have any problems.

## 2015-11-21 NOTE — Assessment & Plan Note (Addendum)
Bronchiectasis with chronic cough and mucus production. She has had 2 exacerbations in very short order treated with Levaquin. She has good compliance with her Xopenex and her flutter valve which appeared to help her clear secretions. It may be that she would benefit as we go forward from rotating antibiotics at the beginning of each month. I will consider this if she continues to exacerbate frequently. Continue prednisone 5 mg daily. High-dose flu shot today.

## 2015-11-21 NOTE — Progress Notes (Signed)
Subjective:    Patient ID: Sarah [name], female    DOB: March 27, 1921, 82 y.o.   MRN: [id number]  HPI 82 yo never smoker, hx remote Legionella PNA, diagnosed with suspected asthma over 10 years ago. PFT's 7/10 with mixed disease, mild AFL. Formerly treated with Advair and albuterol, stopped when she moved to [place] in 2008. Stayed on Singulair. At original consult we d/c'd ACE-I. Started on Symbicort.    ROV 07/13/15 -- follow-up visit for bronchiectasis and suspected mycobacterial disease (never fully diagnosed) with associated chronic cough. She has a history of chronic prednisone use and is currently on 5 mg daily. She has been using flutter valve, Xopenex nebulizer twice a day. She is also on loratadine once a day.  She has been having more cough, more dyspnea. At least twice a day she is having fits of cough prod of pale yellow phlegm.     ROV 08/08/15 -- Return visit for follow-up of the patient's bronchiectasis, upper airway irritation and cough, probable superimposed asthma. At her last visit I treated her for an acute exacerbation of her bronchiectasis and a purulent bronchitis with antibiotics and a prednisone taper. She is usually on chronic prednisone 5 mg daily. She did improve for a while. She has restarted freq cough. She believes that she can relate the cough some to her Po intake, no overt aspiration but she does see LPR. She has a hiatal hernia, is not on a PPI.  She is using xopenex nebs bid. Her dyspnea is better since the pred taper.   ROV 09/19/15 -- Patient has a history of chronic cough with upper airway irritation, bronchiectasis and suspected superimposed asthma. She is on chronic prednisone 5 mg daily. At her last visit we added Protonix 40 mg daily to see if there was a possible GERD contribution to her cough. She believes that it may have helped her reflux and also her cough some. She does still have cough with spells about a day. She sometimes uses flutter valve. Her sputum is  dark gray to yellow. Her last abx were 6-8 weeks ago per her report.   ROV 11/21/15 -- this follow-up visit for bronchiectasis with probable superimposed asthma, upper airway irritation syndrome and chronic cough. We have been treating her with chronic prednisone 5 mg daily. She is on Protonix. At her last visit we treated her with Levaquin for a possible bronchitis. Then we had to treat her again for an AE and w levaquin 10/23. She is using flutter valve and Xopenex nebs bid. She believes that her reflux is better controlled on protonix. Denies any nasal drainage   Review of Systems As above      Objective:   Physical Exam  Vitals:   11/21/15 1137  BP: 124/68  Pulse: 73  SpO2: 93%  Weight: 144 lb 12.8 oz (65.7 kg)  Height: 5\' 6"  (1.676 m)    GEN: A/Ox3; pleasant , NAD, elderly and thin    HEENT:  [place]/AT,  NOSE-clear, THROAT-clear, no lesions, no postnasal drip or exudate noted.   NECK:  No stridor  RESP  Few B insp crackles ,No wheezes  CARD:  RRR, no m/r/g  , no peripheral edema, pulses intact, no cyanosis or clubbing.  Musco: Warm bil, no deformities or joint swelling noted.   Neuro: alert, no focal deficits noted.    Skin: Warm, no lesions or rashes      Assessment & Plan:  BRONCHIECTASIS Bronchiectasis with chronic cough and mucus production. She  has had 2 exacerbations in very short order treated with Levaquin. She has good compliance with her Xopenex and her flutter valve which appeared to help her clear secretions. It may be that she would benefit as we go forward from rotating antibiotics at the beginning of each month. I will consider this if she continues to exacerbate frequently. Continue prednisone 5 mg daily. High-dose flu shot today.  Levy Pupaobert Amberlie Gaillard, MD, PhD 11/21/2015, 11:53 AM [facility] [phone] or if no answer [phone]

## 2015-12-01 ENCOUNTER — Telehealth: Payer: Self-pay | Admitting: Emergency Medicine

## 2015-12-01 NOTE — Telephone Encounter (Signed)
lmomtcb x 1 for [facility]

## 2015-12-01 NOTE — Telephone Encounter (Signed)
Sarah [name] called back and stated that the pharmacy that the pt is using [facility] is having a hard time getting her insurance to pay for her nebulizer medication without having the documentation of her dx.  Last ov note has been faxed to Sarah [name].  Nothing further is needed.

## 2015-12-08 ENCOUNTER — Telehealth: Payer: Self-pay | Admitting: Emergency Medicine

## 2015-12-08 NOTE — Telephone Encounter (Signed)
Form placed in RB look at to complete so the pharmacy will fill her levalbuterol 1.25.  Will forward to lindsay to follow up on next week.

## 2015-12-14 NOTE — Telephone Encounter (Signed)
signed

## 2015-12-18 ENCOUNTER — Telehealth: Payer: Self-pay | Admitting: Emergency Medicine

## 2015-12-18 MED ORDER — LEVOFLOXACIN 500 MG PO TABS: 500.0000 mg | ORAL_TABLET | Freq: Every day | ORAL | 0 refills | Status: DC

## 2015-12-18 MED ORDER — PREDNISONE 10 MG PO TABS: ORAL_TABLET | ORAL | 0 refills | Status: DC

## 2015-12-18 NOTE — Telephone Encounter (Signed)
Spoke with pt's daughter, Sarah [name]. States pt is having issues with breathing. Reports SOB, coughing, wheezing and fever. Cough is producing yellow. Denies chest tightness. Symptoms started on Friday. Pt's daughter would like RB's recommendations.  RB - please advise. Thanks.

## 2015-12-18 NOTE — Telephone Encounter (Signed)
Have her start levaquin 500mg  qd x 7 days. Start Pred taper > Take 40mg  daily for 3 days, then 30mg  daily for 3 days, then 20mg  daily for 3 days, then 10mg  daily for 3 days, then stop. If not improving in next 2-3 days then see her as an acute OV on Friday

## 2015-12-18 NOTE — Telephone Encounter (Signed)
Spoke with the pt's daughter and notified of recs per RB  She verbalized understanding  Rxs sent to pharm  Nothing further needed

## 2015-12-29 ENCOUNTER — Telehealth: Payer: Self-pay | Admitting: Emergency Medicine

## 2015-12-29 NOTE — Telephone Encounter (Signed)
This prescription is not prescribed by our office. [facility] [place] [facility]. I have advised her that we do not fill this for the pt. Nothing further was needed.

## 2016-01-18 DIAGNOSIS — F633 Trichotillomania: Secondary | ICD-10-CM | POA: Diagnosis not present

## 2016-01-18 DIAGNOSIS — F339 Major depressive disorder, recurrent, unspecified: Secondary | ICD-10-CM | POA: Diagnosis not present

## 2016-01-18 DIAGNOSIS — L0390 Cellulitis, unspecified: Secondary | ICD-10-CM | POA: Diagnosis not present

## 2016-01-18 DIAGNOSIS — L03116 Cellulitis of left lower limb: Secondary | ICD-10-CM | POA: Diagnosis not present

## 2016-02-05 DIAGNOSIS — I890 Lymphedema, not elsewhere classified: Secondary | ICD-10-CM | POA: Diagnosis not present

## 2016-02-10 DIAGNOSIS — F339 Major depressive disorder, recurrent, unspecified: Secondary | ICD-10-CM | POA: Diagnosis not present

## 2016-02-10 DIAGNOSIS — L03115 Cellulitis of right lower limb: Secondary | ICD-10-CM | POA: Diagnosis not present

## 2016-02-10 DIAGNOSIS — L03116 Cellulitis of left lower limb: Secondary | ICD-10-CM | POA: Diagnosis not present

## 2016-02-10 DIAGNOSIS — Z8673 Personal history of transient ischemic attack (TIA), and cerebral infarction without residual deficits: Secondary | ICD-10-CM | POA: Diagnosis not present

## 2016-02-10 DIAGNOSIS — Z7902 Long term (current) use of antithrombotics/antiplatelets: Secondary | ICD-10-CM | POA: Diagnosis not present

## 2016-02-10 DIAGNOSIS — F419 Anxiety disorder, unspecified: Secondary | ICD-10-CM | POA: Diagnosis not present

## 2016-02-10 DIAGNOSIS — I10 Essential (primary) hypertension: Secondary | ICD-10-CM | POA: Diagnosis not present

## 2016-02-10 DIAGNOSIS — I890 Lymphedema, not elsewhere classified: Secondary | ICD-10-CM | POA: Diagnosis not present

## 2016-02-10 DIAGNOSIS — J479 Bronchiectasis, uncomplicated: Secondary | ICD-10-CM | POA: Diagnosis not present

## 2016-02-10 DIAGNOSIS — K449 Diaphragmatic hernia without obstruction or gangrene: Secondary | ICD-10-CM | POA: Diagnosis not present

## 2016-02-10 DIAGNOSIS — M8580 Other specified disorders of bone density and structure, unspecified site: Secondary | ICD-10-CM | POA: Diagnosis not present

## 2016-02-12 DIAGNOSIS — F419 Anxiety disorder, unspecified: Secondary | ICD-10-CM | POA: Diagnosis not present

## 2016-02-12 DIAGNOSIS — L03115 Cellulitis of right lower limb: Secondary | ICD-10-CM | POA: Diagnosis not present

## 2016-02-12 DIAGNOSIS — L03116 Cellulitis of left lower limb: Secondary | ICD-10-CM | POA: Diagnosis not present

## 2016-02-12 DIAGNOSIS — I890 Lymphedema, not elsewhere classified: Secondary | ICD-10-CM | POA: Diagnosis not present

## 2016-02-12 DIAGNOSIS — I10 Essential (primary) hypertension: Secondary | ICD-10-CM | POA: Diagnosis not present

## 2016-02-12 DIAGNOSIS — J479 Bronchiectasis, uncomplicated: Secondary | ICD-10-CM | POA: Diagnosis not present

## 2016-02-13 ENCOUNTER — Encounter: Payer: Self-pay | Admitting: Cardiovascular Disease

## 2016-02-13 ENCOUNTER — Ambulatory Visit (INDEPENDENT_AMBULATORY_CARE_PROVIDER_SITE_OTHER): Payer: Medicare Other | Admitting: Cardiovascular Disease

## 2016-02-13 VITALS — BP 120/80 | HR 75 | Ht 66.0 in | Wt 144.0 lb

## 2016-02-13 DIAGNOSIS — I5032 Chronic diastolic (congestive) heart failure: Secondary | ICD-10-CM | POA: Diagnosis not present

## 2016-02-13 DIAGNOSIS — I10 Essential (primary) hypertension: Secondary | ICD-10-CM | POA: Diagnosis not present

## 2016-02-13 MED ORDER — LOSARTAN POTASSIUM 50 MG PO TABS: 50.0000 mg | ORAL_TABLET | Freq: Every day | ORAL | 3 refills | Status: DC

## 2016-02-13 NOTE — Progress Notes (Signed)
Sarah [name] Date of Birth: 1921/07/16 Medical Record #[id number]  Problem list: 1. Hypertension 2. Asthma 3. Depression 4. Coronary artery disease-status post non-ST segment elevation myocardial infarction. Cardiac cath revealed nonobstructive coronary artery disease 5. Bronchiectasis / atypical mycobacterium infection   History of Present Illness: Sarah [name] is seen back today following a recent hospitalization. She was admitted to the hospital with chest tightness. She ruled out for myocardial infarction. She has a history of chest pain in the past and a cardiac catheterization in 2012 was unremarkable.  She originally called EMS when her BP was 220/110  She has had significant difficulty controlling her BP recently.   She has been watching her salt.  She has taken amlodipine and Lisinopril in the past but these have been stopped because she seemed to be overmedicated.    She continues to have intermittent episodes of chest pressure and heaviness.  These are typically associated with marked hypertension although it is not clear whether the hypertension occurs first or as a result of her  feeling poorly.  Jun 11, 2012:  Sarah [name] was seen by Truitt Merle in February, 2014. She had been measuring her blood pressure at least 10 times every day and was worried because her blood pressure seemed to be somewhat labile.  She feels poorly today.  She did not sleep well last night.  She had a headache.   She has lots of anxiety issues.  She takes xanax with some relief.    June 2 , 2015:  Sarah [name] is doing ok.  Upset about having to put her husband in an memory care SNF.  Lots of guilt about that.    Has a home sitter with her today. No significant CP or dyspnea.  She has some anxiety - has attacks on occasion.  Her anxiety seems to start in her chest as a CP.  If she takes the xanax and Tylenol #3 in time, she can avoid the panic attacks.    She thinks she had 3 TIAs this past year.   Carotid duplex was negative.  MRI was normal.    She has been on 1 ASA 325 mg a day.   Jan. 31, 2017:  Sarah [name] is ding well.   BP and HR look great today Still short of breath .  Has pulmonary issues.   Sees [name] .   Jan. 30, 2018:    Doing well from a cardiac .    Just getting over an episode of cellulitis and lymphedema .  No further CP    Current Outpatient Prescriptions on File Prior to Visit  Medication Sig Dispense Refill  . ALPRAZolam (XANAX) 0.5 MG tablet Take 1 tablet (0.5 mg total) by mouth 2 (two) times daily.  0  . amLODipine (NORVASC) 5 MG tablet Take 5 mg by mouth daily.     Marland Kitchen atorvastatin (LIPITOR) 10 MG tablet Take 1 tablet (10 mg total) by mouth daily at 6 PM. 30 tablet 1  . calcium carbonate (TUMS - DOSED IN MG ELEMENTAL CALCIUM) 500 MG chewable tablet Chew 1 tablet by mouth every 8 (eight) hours as needed for indigestion or heartburn.    . clonazePAM (KLONOPIN) 0.5 MG tablet Take 0.25 mg by mouth at bedtime.     . clopidogrel (PLAVIX) 75 MG tablet Take 1 tablet (75 mg total) by mouth daily. 30 tablet 1  . DULoxetine (CYMBALTA) 20 MG capsule Take 20 mg by mouth 3 (three) times daily.    Marland Kitchen  escitalopram (LEXAPRO) 5 MG tablet Take 2.5 mg by mouth daily.    Marland Kitchen lactulose (CHRONULAC) 10 GM/15ML solution Take 15 mLs (10 g total) by mouth 2 (two) times daily as needed for moderate constipation. 240 mL 0  . latanoprost (XALATAN) 0.005 % ophthalmic solution Place 1 drop into both eyes at bedtime.     . levalbuterol (XOPENEX HFA) 45 MCG/ACT inhaler Inhale 1-2 puffs into the lungs every 6 (six) hours as needed for wheezing.    . levalbuterol (XOPENEX) 1.25 MG/3ML nebulizer solution Take 1.25 mg by nebulization 2 (two) times daily. At 1pm and 7pm 180 mL 5  . levofloxacin (LEVAQUIN) 500 MG tablet Take 1 tablet (500 mg total) by mouth daily. 7 tablet 0  . levothyroxine (SYNTHROID, LEVOTHROID) 50 MCG tablet Take 50 mcg by mouth daily.      Marland Kitchen loratadine (CLARITIN) 10 MG tablet  Take 10 mg by mouth daily.    . pantoprazole (PROTONIX) 40 MG tablet Take 1 capsule 1 hour before your first meal of the day 30 tablet 5  . predniSONE (DELTASONE) 5 MG tablet TAKE 1 TABLET BY MOUTH ONCE DAILY WITH BREAKFAST. 30 tablet 2   No current facility-administered medications on file prior to visit.     Allergies  Allergen Reactions  . Codeine Anaphylaxis  . Hydrocodone Nausea And Vomiting and Other (See Comments)    SYNCOPE AND BRADYCARDIA  . Isosorbide Nitrate Other (See Comments)    BRADYCARDIA  . Oxycodone Palpitations    Rapid heart beat  . Clarithromycin Nausea And Vomiting  . Fiorinal [Butalbital-Aspirin-Caffeine] Swelling and Other (See Comments)    Facial swelling   . Levofloxacin Nausea And Vomiting and Other (See Comments)    SYNCOPE ALSO  . Augmentin [Amoxicillin-Pot Clavulanate] Diarrhea    Past Medical History:  Diagnosis Date  . Allergic rhinitis   . Anxiety    Anxiety attacks  . Arthritis    "a little; in my back and hands" (04/15/2012)  . Asthma   . Bronchiectasis    with history of Legionaires with chronic rales/rhonchi  . Chest pain at rest   . Daily headache    "over the last week" 04/15/2012   . Depression   . Diastolic dysfunction    a. per echo 08/2010 with normal LV function;  b. 06/2011 Echo: EF 65-70%  . GERD (gastroesophageal reflux disease)    "just recently" (04/15/2012)  . H/O hiatal hernia   . H/O Legionnaire's disease 11/1976  . History of blood transfusion 1972   "w/hysterectomy" (04/15/2012)  . Hypercholesteremia   . Hypertension    Negative renal duplex in December of 2012  . Hypothyroidism   . Idiopathic peripheral neuropathy   . MAC (mycobacterium avium-intracellulare complex)   . Migraines    "outgrew them" (04/15/2012)  . NSTEMI (non-ST elevated myocardial infarction) ([place])    a. Normal coronaries per cath August 2012 and negative CT angio for PE;  b. 06/2011 Repeat admission w/ chest pain and elevated troponin's, CTA Chest w/o  PE  . Osteopenia 02/2011   t score - 2.1  . Pneumonia    "recurrent" (04/15/2012)  . Pollen allergies   . Shortness of breath    "sometimes just lying down" (04/15/2012)  . Stroke [facility]) 06/2014   cerebellum    Past Surgical History:  Procedure Laterality Date  . APPENDECTOMY    . CARDIAC CATHETERIZATION  August 2012   Normal coronaries.  Marland Kitchen DILATION AND CURETTAGE OF UTERUS    .  TONSILLECTOMY  1930  . TOTAL ABDOMINAL HYSTERECTOMY W/ BILATERAL SALPINGOOPHORECTOMY  1972   leiomyomata, menorrhagia    History  Smoking Status  . Never Smoker  Smokeless Tobacco  . Never Used    History  Alcohol Use  . 1.2 oz/week  . 2 Glasses of wine per week    Comment: wine 1-2 week    Family History  Problem Relation Age of Onset  . Hypertension Mother     stroke  . Stroke Mother   . Cancer Brother     prostate  . Osteoarthritis Paternal Aunt     Review of Systems: The review of systems is per the HPI.  All other systems were reviewed and are negative.  Physical Exam: BP 120/80   Pulse 75   Ht _0  (1.676 m)   Wt 144 lb (65.3 kg)   LMP 01/14/1970   BMI 23.24 kg/m  Sarah [name] is very pleasant and in no acute distress. Skin is warm and dry. Color is normal.  HEENT is unremarkable. Normocephalic/atraumatic. PERRL. Sclera are nonicteric. Neck is supple. No masses. No JVD. Lungs show some persistant rales / rhonchi which are chronic. Cardiac exam shows a regular rate and rhythm. Abdomen is soft. Extremities are without edema. Gait and ROM are intact. No gross neurologic deficits noted.  LABORATORY DATA:  Lab Results  Component Value Date   WBC 16.9 (H) 10/06/2014   HGB 9.7 (L) 10/06/2014   HCT 32.4 (L) 10/06/2014   PLT 227 10/06/2014   GLUCOSE 101 (H) 10/07/2014   CHOL 179 07/09/2014   TRIG 66 07/09/2014   HDL 67 07/09/2014   LDLCALC 99 07/09/2014   ALT 15 10/06/2014   AST 30 10/06/2014   NA 137 10/07/2014   K 4.0 10/07/2014   CL 104 10/07/2014   CREATININE 0.78 10/07/2014    BUN 12 10/07/2014   CO2 26 10/07/2014   TSH 1.40 03/21/2015   INR 1.10 07/08/2014   HGBA1C 6.0 (H) 07/09/2014   ECG:  Jan. 30, 2018:    NSR at 75.  Normal ECG   Assessment / Plan:  1. Hypertension- Her blood pressure is well-controlled but she does not like the amlodipine specifically. It tends to make her legs swell a little bit. We'll stop the amlodipine and start her on losartan 50 mg today. We will see her back in 3 weeks for a blood pressure check/nurse visit as well as a basic metabolic profile. I will plan on seeing her again in one year. She goes to see Dr. Kyung Rudd on a regular basis. I would be happy to see her sooner  if she needs further adjustments to her losartan dose.  2. Asthma  3. Depression  4. Coronary artery disease-status post non-ST segment elevation myocardial infarction. Cardiac cath revealed nonobstructive coronary artery disease  5. Bronchiectasis / atypical mycobacterium infection  Followed by Leroy Sea, MD  02/13/2016 12:19 PM    [address],  [address] [place], [place]  [address] Pager [phone] Phone: [phone]; Fax: [phone]

## 2016-02-13 NOTE — Patient Instructions (Signed)
Medication Instructions:  STOP Amlodipine (Norvasc) START Losartan (Cozaar) 50 mg once daily   Labwork: Your physician recommends that you return for lab work on Tuesday Feb. 27 at 11:00 for basic metabolic panel    Testing/Procedures: None Ordered   Follow-Up: Your physician recommends that you return for a BP check/Nurse visit on Tuesday Feb. 27 at 11:00 am   Your physician wants you to follow-up in: 1 year with Dr. Elease HashimotoNahser.  You will receive a reminder letter in the mail two months in advance. If you don't receive a letter, please call our office to schedule the follow-up appointment.   If you need a refill on your cardiac medications before your next appointment, please call your pharmacy.   Thank you for choosing [facility]! Eligha BridegroomMichelle Swinyer, RN [phone]

## 2016-02-19 DIAGNOSIS — L03116 Cellulitis of left lower limb: Secondary | ICD-10-CM | POA: Diagnosis not present

## 2016-02-19 DIAGNOSIS — F419 Anxiety disorder, unspecified: Secondary | ICD-10-CM | POA: Diagnosis not present

## 2016-02-19 DIAGNOSIS — J479 Bronchiectasis, uncomplicated: Secondary | ICD-10-CM | POA: Diagnosis not present

## 2016-02-19 DIAGNOSIS — I890 Lymphedema, not elsewhere classified: Secondary | ICD-10-CM | POA: Diagnosis not present

## 2016-02-19 DIAGNOSIS — I10 Essential (primary) hypertension: Secondary | ICD-10-CM | POA: Diagnosis not present

## 2016-02-19 DIAGNOSIS — L03115 Cellulitis of right lower limb: Secondary | ICD-10-CM | POA: Diagnosis not present

## 2016-02-20 ENCOUNTER — Encounter: Payer: Self-pay | Admitting: Adult Health

## 2016-02-20 ENCOUNTER — Ambulatory Visit (INDEPENDENT_AMBULATORY_CARE_PROVIDER_SITE_OTHER): Payer: Medicare Other | Admitting: Adult Health

## 2016-02-20 DIAGNOSIS — J4521 Mild intermittent asthma with (acute) exacerbation: Secondary | ICD-10-CM | POA: Diagnosis not present

## 2016-02-20 MED ORDER — DOXYCYCLINE HYCLATE 100 MG PO TABS: 100.0000 mg | ORAL_TABLET | Freq: Two times a day (BID) | ORAL | 0 refills | Status: DC

## 2016-02-20 NOTE — Patient Instructions (Addendum)
Begin Doxycycline 100mg  Twice daily  For 7 days  Mucinex DM Twice daily  As needed  Cough/congestion and flutter valve.  Increase prednisone 10mg  .daily for 1 week then 5mg  daily .  Follow up.Dr. Delton CoombesByrum as planned and As needed   Please contact office for sooner follow up if symptoms do not improve or worsen or seek emergency care

## 2016-02-20 NOTE — Progress Notes (Signed)
_0  ID: Sarah [name], female    DOB: 07-02-1921, 83 y.o.   MRN: [id number]  Chief Complaint  Patient presents with  . Acute Visit    Cough     Referring provider: [name], MD  HPI: 83 yo never smoker, hx remote Legionella PNA, diagnosed with suspected asthma over 10 years ago. PFT's 7/10 with mixed disease, mild AFL. Formerly treated with Advair and albuterol, stopped when she moved to [place] in 2008. Stayed on Singulair. At original consult we d/c'd ACE-I. Started on Symbicort. She is followed for bronchiectasis and suspected mycobacterial disease w/ chronic cough ./asthma on prednisone 18m daily .     02/20/16 Acute OV  Pt presents for an acute office visit. Complains of 4 days of cough, congestion, and drainage for 4 days . She complains of thick yellow mucus . No fever or hemoptysis .  Taking mucinex w/ some help. Does have flutter valve.  Remains on prednisone 561mdaily .   Recovering from cellulitis . Legs are better but still has some lingering redness.  Finished abx last month. No diarrhea.    Allergies  Allergen Reactions  . Codeine Anaphylaxis  . Hydrocodone Nausea And Vomiting and Other (See Comments)    SYNCOPE AND BRADYCARDIA  . Isosorbide Nitrate Other (See Comments)    BRADYCARDIA  . Oxycodone Palpitations    Rapid heart beat  . Clarithromycin Nausea And Vomiting  . Fiorinal [Butalbital-Aspirin-Caffeine] Swelling and Other (See Comments)    Facial swelling   . Levofloxacin Nausea And Vomiting and Other (See Comments)    SYNCOPE ALSO  . Augmentin [Amoxicillin-Pot Clavulanate] Diarrhea    Immunization History  Administered Date(s) Administered  . Influenza Split 10/15/2011, 10/14/2012  . Influenza Whole 10/17/2008, 11/14/2009, 11/13/2010  . Influenza, High Dose Seasonal PF 11/21/2015  . Influenza,inj,Quad PF,36+ Mos 09/17/2013, 10/07/2014  . Pneumococcal Polysaccharide-23 02/19/1999    Past Medical History:  Diagnosis Date  . Allergic  rhinitis   . Anxiety    Anxiety attacks  . Arthritis    "a little; in my back and hands" (04/15/2012)  . Asthma   . Bronchiectasis    with history of Legionaires with chronic rales/rhonchi  . Chest pain at rest   . Daily headache    "over the last week" 04/15/2012   . Depression   . Diastolic dysfunction    a. per echo 08/2010 with normal LV function;  b. 06/2011 Echo: EF 65-70%  . GERD (gastroesophageal reflux disease)    "just recently" (04/15/2012)  . H/O hiatal hernia   . H/O Legionnaire's disease 11/1976  . History of blood transfusion 1972   "w/hysterectomy" (04/15/2012)  . Hypercholesteremia   . Hypertension    Negative renal duplex in December of 2012  . Hypothyroidism   . Idiopathic peripheral neuropathy   . MAC (mycobacterium avium-intracellulare complex)   . Migraines    "outgrew them" (04/15/2012)  . [place]   a. Normal coronaries per cath August 2012 and negative CT angio for PE;  b. 06/2011 Repeat admission w/ chest pain and elevated troponin's, CTA Chest w/o PE  . Osteopenia 02/2011   t score - 2.1  . Pneumonia    "recurrent" (04/15/2012)  . Pollen allergies   . Shortness of breath    "sometimes just lying down" (04/15/2012)  . [place]   [place]re[place]    Tobacco History: History  Smoking Status  . Never Smoker  Smokeless Tobacco  . Never  Used   Counseling given: Not Answered   Outpatient Encounter Prescriptions as of 02/20/2016  Medication Sig  . ALPRAZolam (XANAX) 0.5 MG tablet Take 1 tablet (0.5 mg total) by mouth 2 (two) times daily.  Marland Kitchen atorvastatin (LIPITOR) 10 MG tablet Take 1 tablet (10 mg total) by mouth daily at 6 PM.  . calcium carbonate (TUMS - DOSED IN MG ELEMENTAL CALCIUM) 500 MG chewable tablet Chew 1 tablet by mouth every 8 (eight) hours as needed for indigestion or heartburn.  . clonazePAM (KLONOPIN) 0.5 MG tablet Take 0.25 mg by mouth at bedtime.   . clopidogrel (PLAVIX) 75 MG tablet Take 1  tablet (75 mg total) by mouth daily.  . DULoxetine (CYMBALTA) 20 MG capsule Take 20 mg by mouth 3 (three) times daily.  Marland Kitchen escitalopram (LEXAPRO) 5 MG tablet Take 2.5 mg by mouth daily.  Marland Kitchen gabapentin (NEURONTIN) 100 MG capsule Take 100 mg by mouth daily.  Marland Kitchen lactulose (CHRONULAC) 10 GM/15ML solution Take 15 mLs (10 g total) by mouth 2 (two) times daily as needed for moderate constipation.  Marland Kitchen latanoprost (XALATAN) 0.005 % ophthalmic solution Place 1 drop into both eyes at bedtime.   . levalbuterol (XOPENEX HFA) 45 MCG/ACT inhaler Inhale 1-2 puffs into the lungs every 6 (six) hours as needed for wheezing.  . levalbuterol (XOPENEX) 1.25 MG/3ML nebulizer solution Take 1.25 mg by nebulization 2 (two) times daily. At 1pm and 7pm  . levothyroxine (SYNTHROID, LEVOTHROID) 50 MCG tablet Take 50 mcg by mouth daily.    Marland Kitchen loratadine (CLARITIN) 10 MG tablet Take 10 mg by mouth daily.  Marland Kitchen losartan (COZAAR) 50 MG tablet Take 1 tablet (50 mg total) by mouth daily.  . metoprolol tartrate (LOPRESSOR) 25 MG tablet Take 12.5 mg by mouth 2 (two) times daily. May take an additional dose once daily if needed for systolic bp above 491  . pantoprazole (PROTONIX) 40 MG tablet Take 1 capsule 1 hour before your first meal of the day  . predniSONE (DELTASONE) 5 MG tablet TAKE 1 TABLET BY MOUTH ONCE DAILY WITH BREAKFAST.  Marland Kitchen doxycycline (VIBRA-TABS) 100 MG tablet Take 1 tablet (100 mg total) by mouth 2 (two) times daily.  . [DISCONTINUED] levofloxacin (LEVAQUIN) 500 MG tablet Take 1 tablet (500 mg total) by mouth daily. (Patient not taking: Reported on 02/20/2016)   No facility-administered encounter medications on file as of 02/20/2016.      Review of Systems  Constitutional:   No  weight loss, night sweats,  Fevers, chills, + fatigue, or  lassitude.  HEENT:   No headaches,  Difficulty swallowing,  Tooth/dental problems, or  Sore throat,                No sneezing, itching, ear ache,  +nasal congestion, post nasal drip,    CV:  No chest pain,  Orthopnea, PND,    GI  No heartburn, indigestion, abdominal pain, nausea, vomiting, diarrhea, change in bowel habits, loss of appetite, bloody stools.   Resp:   No wheezing.  No chest wall deformity  Skin: no rash or lesions.  GU: no dysuria, change in color of urine, no urgency or frequency.  No flank pain, no hematuria   MS:  No joint pain or swelling.  No decreased range of motion.  No back pain.    Physical Exam  BP 132/74 (BP Location: Left Arm, Cuff Size: Normal)   Pulse 74   Ht 5' 6.5" (1.689 m)   Wt 144 lb 9.6 oz (65.6 kg)  LMP 01/14/1970   SpO2 93%   BMI 22.99 kg/m   GEN: A/Ox3; pleasant , NAD, elderly in wc    HEENT:  [place]/AT,  EACs-clear, TMs-wnl, NOSE-clear, THROAT-clear, no lesions, no postnasal drip or exudate noted.   NECK:  Supple w/ fair ROM; no JVD; normal carotid impulses w/o bruits; no thyromegaly or nodules palpated; no lymphadenopathy.    RESP  Clear  P & A; w/o, wheezes/ rales/ or rhonchi. no accessory muscle use, no dullness to percussion  CARD:  RRR, no m/r/g, tr peripheral edema, pulses intact, no cyanosis or clubbing.  GI:   Soft & nt; nml bowel sounds; no organomegaly or masses detected.   Musco: Warm bil, no deformities or joint swelling noted.   Neuro: alert, no focal deficits noted.    Skin: Warm, no lesions or rashes, mild redness along distal legs, no streaking noted. No ulcerations   Lab Results:  Imaging: No results found.   Assessment & Plan:   Asthma Mild flare with URI/Bronchitis   Plan  Patient Instructions  Begin Doxycycline 119m Twice daily  For 7 days  Mucinex DM Twice daily  As needed  Cough/congestion and flutter valve.  Increase prednisone 164m.daily for 1 week then 48m77maily .  Follow up.Dr. ByrLamonte Sakai planned and As needed   Please contact office for sooner follow up if symptoms do not improve or worsen or seek emergency care         TamRexene EdisonP 02/22/2016

## 2016-02-22 NOTE — Assessment & Plan Note (Signed)
Mild flare with URI/Bronchitis   Plan  Patient Instructions  Begin Doxycycline 100mg  Twice daily  For 7 days  Mucinex DM Twice daily  As needed  Cough/congestion and flutter valve.  Increase prednisone 10mg  .daily for 1 week then 5mg  daily .  Follow up.Dr. Delton CoombesByrum as planned and As needed   Please contact office for sooner follow up if symptoms do not improve or worsen or seek emergency care

## 2016-02-26 DIAGNOSIS — L03116 Cellulitis of left lower limb: Secondary | ICD-10-CM | POA: Diagnosis not present

## 2016-02-26 DIAGNOSIS — F419 Anxiety disorder, unspecified: Secondary | ICD-10-CM | POA: Diagnosis not present

## 2016-02-26 DIAGNOSIS — J479 Bronchiectasis, uncomplicated: Secondary | ICD-10-CM | POA: Diagnosis not present

## 2016-02-26 DIAGNOSIS — I890 Lymphedema, not elsewhere classified: Secondary | ICD-10-CM | POA: Diagnosis not present

## 2016-02-26 DIAGNOSIS — L03115 Cellulitis of right lower limb: Secondary | ICD-10-CM | POA: Diagnosis not present

## 2016-02-26 DIAGNOSIS — I10 Essential (primary) hypertension: Secondary | ICD-10-CM | POA: Diagnosis not present

## 2016-02-27 ENCOUNTER — Ambulatory Visit: Payer: Self-pay | Admitting: Cardiovascular Disease

## 2016-03-04 DIAGNOSIS — L03116 Cellulitis of left lower limb: Secondary | ICD-10-CM | POA: Diagnosis not present

## 2016-03-04 DIAGNOSIS — I10 Essential (primary) hypertension: Secondary | ICD-10-CM | POA: Diagnosis not present

## 2016-03-04 DIAGNOSIS — J479 Bronchiectasis, uncomplicated: Secondary | ICD-10-CM | POA: Diagnosis not present

## 2016-03-04 DIAGNOSIS — F419 Anxiety disorder, unspecified: Secondary | ICD-10-CM | POA: Diagnosis not present

## 2016-03-04 DIAGNOSIS — L03115 Cellulitis of right lower limb: Secondary | ICD-10-CM | POA: Diagnosis not present

## 2016-03-04 DIAGNOSIS — I890 Lymphedema, not elsewhere classified: Secondary | ICD-10-CM | POA: Diagnosis not present

## 2016-03-05 ENCOUNTER — Encounter: Payer: Self-pay | Admitting: Emergency Medicine

## 2016-03-05 ENCOUNTER — Ambulatory Visit (INDEPENDENT_AMBULATORY_CARE_PROVIDER_SITE_OTHER): Payer: Medicare Other | Admitting: Emergency Medicine

## 2016-03-05 ENCOUNTER — Ambulatory Visit: Payer: Self-pay | Admitting: Emergency Medicine

## 2016-03-05 VITALS — BP 138/84 | HR 89 | Ht 66.0 in | Wt 144.0 lb

## 2016-03-05 DIAGNOSIS — J479 Bronchiectasis, uncomplicated: Secondary | ICD-10-CM

## 2016-03-05 MED ORDER — DOXYCYCLINE HYCLATE 100 MG PO TABS: 100.0000 mg | ORAL_TABLET | Freq: Two times a day (BID) | ORAL | 3 refills | Status: DC

## 2016-03-05 MED ORDER — AZITHROMYCIN 250 MG PO TABS: ORAL_TABLET | ORAL | 3 refills | Status: DC

## 2016-03-05 MED ORDER — LEVOFLOXACIN 500 MG PO TABS: 500.0000 mg | ORAL_TABLET | Freq: Every day | ORAL | 3 refills | Status: DC

## 2016-03-05 NOTE — Patient Instructions (Addendum)
We will start rotating antibiotics as follows:  Month 1: levaquin Month 2: azithromycin Month 3: doxycycline  Please continue your prednisone 5mg  daily.  Stop xopenex Use albuterol nebulizer up to every 6 hours as needed for shortness of breath Please continue your loratadine and protonix  Follow with Dr Delton CoombesByrum in 2 months or sooner if you have any problems.

## 2016-03-05 NOTE — Progress Notes (Signed)
Subjective:    Patient ID: Sarah [name], female    DOB: Jun 17, 1921, 83 y.o.   MRN: [id number]  HPI 83 yo never smoker, hx remote Legionella PNA, diagnosed with suspected asthma over 10 years ago. PFT's 7/10 with mixed disease, mild AFL. Formerly treated with Advair and albuterol, stopped when she moved to [place] in 2008. Stayed on Singulair. At original consult we d/c'd ACE-I. Started on Symbicort.    ROV 07/13/15 -- follow-up visit for bronchiectasis and suspected mycobacterial disease (never fully diagnosed) with associated chronic cough. She has a history of chronic prednisone use and is currently on 5 mg daily. She has been using flutter valve, Xopenex nebulizer twice a day. She is also on loratadine once a day.  She has been having more cough, more dyspnea. At least twice a day she is having fits of cough prod of pale yellow phlegm.     ROV 08/08/15 -- Return visit for follow-up of the patient's bronchiectasis, upper airway irritation and cough, probable superimposed asthma. At her last visit I treated her for an acute exacerbation of her bronchiectasis and a purulent bronchitis with antibiotics and a prednisone taper. She is usually on chronic prednisone 5 mg daily. She did improve for a while. She has restarted freq cough. She believes that she can relate the cough some to her Po intake, no overt aspiration but she does see LPR. She has a hiatal hernia, is not on a PPI.  She is using xopenex nebs bid. Her dyspnea is better since the pred taper.   ROV 09/19/15 -- Patient has a history of chronic cough with upper airway irritation, bronchiectasis and suspected superimposed asthma. She is on chronic prednisone 5 mg daily. At her last visit we added Protonix 40 mg daily to see if there was a possible GERD contribution to her cough. She believes that it may have helped her reflux and also her cough some. She does still have cough with spells about a day. She sometimes uses flutter valve. Her sputum is  dark gray to yellow. Her last abx were 6-8 weeks ago per her report.   ROV 11/21/15 -- this follow-up visit for bronchiectasis with probable superimposed asthma, upper airway irritation syndrome and chronic cough. We have been treating her with chronic prednisone 5 mg daily. She is on Protonix. At her last visit we treated her with Levaquin for a possible bronchitis. Then we had to treat her again for an AE and w levaquin 10/23. She is using flutter valve and Xopenex nebs bid. She believes that her reflux is better controlled on protonix. Denies any nasal drainage  ROV 03/05/16 -- Sarah [name] has a history of bronchiectasis and upper airway irritation with chronic cough, probably also true asthma. She's been managed on chronic prednisone, Protonix. She's had multiple episodes of acute bronchitic syndrome, most recently treated 02/22/16. She increased her prednisone to 10 mg daily for 1 week and took doxycycline. She is now back down to prednisone 5 mg daily.  She has been rx with serial abx (also for a cellulitis) since November.   Review of Systems As above      Objective:   Physical Exam  Vitals:   03/05/16 1328  BP: 138/84  Pulse: 89  SpO2: 94%  Weight: 144 lb (65.3 kg)  Height: 5\' 6"  (1.676 m)    GEN: A/Ox3; pleasant , NAD, elderly and thin    HEENT:  [place]/AT,  NOSE-clear, THROAT-clear, no lesions, no postnasal drip or exudate noted.  NECK:  No stridor  RESP  Few B insp crackles ,No wheezes  CARD:  RRR, no m/r/g  , no peripheral edema, pulses intact, no cyanosis or clubbing.  Musco: Warm bil, no deformities or joint swelling noted.   Neuro: alert, no focal deficits noted.    Skin: Warm, no lesions or rashes      Assessment & Plan:  BRONCHIECTASIS We will start rotating antibiotics as follows:  Month 1: levaquin Month 2: azithromycin Month 3: doxycycline  Please continue your prednisone 5mg  daily.  Stop xopenex Use albuterol nebulizer up to every 6 hours as needed for  shortness of breath Please continue your loratadine and protonix  Follow with Dr Delton CoombesByrum in 2 months or sooner if you have any problems.  Sarah Pupaobert Jamyrah Saur, MD, PhD 03/05/2016, 2:05 PM [facility] [phone] or if no answer [phone]

## 2016-03-05 NOTE — Assessment & Plan Note (Signed)
We will start rotating antibiotics as follows:  Month 1: levaquin Month 2: azithromycin Month 3: doxycycline  Please continue your prednisone 5mg daily.  Stop xopenex Use albuterol nebulizer up to every 6 hours as needed for shortness of breath Please continue your loratadine and protonix  Follow with Dr Evens Meno in 2 months or sooner if you have any problems.  

## 2016-03-07 ENCOUNTER — Telehealth: Payer: Self-pay | Admitting: Emergency Medicine

## 2016-03-07 NOTE — Telephone Encounter (Signed)
She had recently tolerated Levaquin but apparently is not when he be able to do so now. I would have her switch to the azithromycin and take it. We will have to find a substitute for the Levaquin in our antibiotic rotation.

## 2016-03-07 NOTE — Telephone Encounter (Signed)
Called and spoke with Johnny BridgeMartha and she is aware of RB recs.  She will start the pt on the azithromycin tonight and do the full course.  She is aware that we will contact her with the next abx for next month.  RB please advise. thanks

## 2016-03-07 NOTE — Telephone Encounter (Signed)
Called and spoke with Sarah [name] and she stated that the pt started on levaquin that was given by RB on Tuesday and she didn't sleep well.  She took this again Wednesday night and has a worse time sleeping and also c/o of her eyes swelling some along with red blotches and she vomited x 1.  I advised Sarah [name] to hold the levaquin until she hears back from us.    Sarah [name] also wanted to know if they should go ahead and give her a different abx, since she takes a different abx for 1 week every month.  Should they continue to do this?    RB please advise. Thanks  Allergies  Allergen Reactions  . Codeine Anaphylaxis  . Hydrocodone Nausea And Vomiting and Other (See Comments)    SYNCOPE AND BRADYCARDIA  . Isosorbide Nitrate Other (See Comments)    BRADYCARDIA  . Oxycodone Palpitations    Rapid heart beat  . Clarithromycin Nausea And Vomiting  . Fiorinal [Butalbital-Aspirin-Caffeine] Swelling and Other (See Comments)    Facial swelling   . Levofloxacin Nausea And Vomiting and Other (See Comments)    SYNCOPE ALSO  . Augmentin [Amoxicillin-Pot Clavulanate] Diarrhea

## 2016-03-11 DIAGNOSIS — I10 Essential (primary) hypertension: Secondary | ICD-10-CM | POA: Diagnosis not present

## 2016-03-11 DIAGNOSIS — J479 Bronchiectasis, uncomplicated: Secondary | ICD-10-CM | POA: Diagnosis not present

## 2016-03-11 DIAGNOSIS — I890 Lymphedema, not elsewhere classified: Secondary | ICD-10-CM | POA: Diagnosis not present

## 2016-03-11 DIAGNOSIS — F419 Anxiety disorder, unspecified: Secondary | ICD-10-CM | POA: Diagnosis not present

## 2016-03-11 DIAGNOSIS — L03115 Cellulitis of right lower limb: Secondary | ICD-10-CM | POA: Diagnosis not present

## 2016-03-11 DIAGNOSIS — L03116 Cellulitis of left lower limb: Secondary | ICD-10-CM | POA: Diagnosis not present

## 2016-03-11 NOTE — Telephone Encounter (Signed)
We will do doxycycline for one week in March 2018. She will follow up with me afterwards and then we will decide we'll add a third antibiotic to the rotation or continue to cycle azithromycin and doxycycline.

## 2016-03-11 NOTE — Telephone Encounter (Signed)
Spoke with pt's daughter, Johnny BridgeMartha. She is aware of RB's response. Pt's OV in April has been moved up to 04/26/16. Nothing further was needed.

## 2016-03-12 ENCOUNTER — Ambulatory Visit (INDEPENDENT_AMBULATORY_CARE_PROVIDER_SITE_OTHER): Payer: Medicare Other | Admitting: Nurse Practitioner

## 2016-03-12 VITALS — BP 112/68 | HR 76 | Ht 66.0 in | Wt 142.8 lb

## 2016-03-12 DIAGNOSIS — H43813 Vitreous degeneration, bilateral: Secondary | ICD-10-CM | POA: Diagnosis not present

## 2016-03-12 DIAGNOSIS — I5032 Chronic diastolic (congestive) heart failure: Secondary | ICD-10-CM | POA: Diagnosis not present

## 2016-03-12 DIAGNOSIS — I10 Essential (primary) hypertension: Secondary | ICD-10-CM | POA: Diagnosis not present

## 2016-03-12 DIAGNOSIS — H353212 Exudative age-related macular degeneration, right eye, with inactive choroidal neovascularization: Secondary | ICD-10-CM | POA: Diagnosis not present

## 2016-03-12 DIAGNOSIS — H353122 Nonexudative age-related macular degeneration, left eye, intermediate dry stage: Secondary | ICD-10-CM | POA: Diagnosis not present

## 2016-03-12 LAB — BASIC METABOLIC PANEL
BUN/Creatinine Ratio: 17 (ref 12–28)
BUN: 16 mg/dL (ref 10–36)
CALCIUM: 9.2 mg/dL (ref 8.7–10.3)
CO2: 23 mmol/L (ref 18–29)
Chloride: 101 mmol/L (ref 96–106)
Creatinine, Ser: 0.93 mg/dL (ref 0.57–1.00)
GFR calc Af Amer: 61 mL/min/{1.73_m2} (ref >59)
GFR, EST NON AFRICAN AMERICAN: 53 mL/min/{1.73_m2} — AB (ref >59)
GLUCOSE: 91 mg/dL (ref 65–99)
POTASSIUM: 4.5 mmol/L (ref 3.5–5.2)
SODIUM: 141 mmol/L (ref 134–144)

## 2016-03-12 NOTE — Progress Notes (Signed)
1.) Reason for visit: BP check  2.) Name of MD requesting visit: Dr. Elease HashimotoNahser  3.) H&P: Patient presents for follow-up BP check since stopping Amlodipine and starting Losartan 50 mg at last office visit with Dr. Elease HashimotoNahser on 1/30.  4.) ROS related to problem: Patient denies complaints. When asked specifically about dizziness or light-headedness, she states she notices a funny feeling in her head occasionally but does not associate it with taking BP medication or with changing positions. Her caretaker states she has not noticed that patient is unsteady on her feet.   5.) Assessment and plan per MD: Continue current medications and plan to follow-up in 1 year

## 2016-03-12 NOTE — Patient Instructions (Signed)
Medication Instructions:  Your physician recommends that you continue on your current medications as directed. Please refer to the Current Medication list given to you today.   Labwork: TODAY - basic metabolic panel   Testing/Procedures: None Ordered   Follow-Up: Your physician wants you to follow-up in: 1 year with Dr. Nahser. You will receive a reminder letter in the mail two months in advance. If you don't receive a letter, please call our office to schedule the follow-up appointment.   If you need a refill on your cardiac medications before your next appointment, please call your pharmacy.   Thank you for choosing [facility]! Kaleya Douse, RN [phone]    

## 2016-03-13 ENCOUNTER — Telehealth: Payer: Self-pay | Admitting: Cardiovascular Disease

## 2016-03-13 NOTE — Telephone Encounter (Signed)
Left message to call back  

## 2016-03-13 NOTE — Telephone Encounter (Signed)
Spoke with pt's daughter about lab done 03/12/16

## 2016-03-13 NOTE — Telephone Encounter (Signed)
Follow Up   Pt daughter returning phone call. States phone was on silent.

## 2016-03-13 NOTE — Telephone Encounter (Signed)
New message    Pt daughter is returning Christine's call about lab results.

## 2016-03-18 DIAGNOSIS — L03116 Cellulitis of left lower limb: Secondary | ICD-10-CM | POA: Diagnosis not present

## 2016-03-18 DIAGNOSIS — J479 Bronchiectasis, uncomplicated: Secondary | ICD-10-CM | POA: Diagnosis not present

## 2016-03-18 DIAGNOSIS — L03115 Cellulitis of right lower limb: Secondary | ICD-10-CM | POA: Diagnosis not present

## 2016-03-18 DIAGNOSIS — I890 Lymphedema, not elsewhere classified: Secondary | ICD-10-CM | POA: Diagnosis not present

## 2016-03-18 DIAGNOSIS — I10 Essential (primary) hypertension: Secondary | ICD-10-CM | POA: Diagnosis not present

## 2016-03-18 DIAGNOSIS — F419 Anxiety disorder, unspecified: Secondary | ICD-10-CM | POA: Diagnosis not present

## 2016-03-28 DIAGNOSIS — F419 Anxiety disorder, unspecified: Secondary | ICD-10-CM | POA: Diagnosis not present

## 2016-03-28 DIAGNOSIS — I890 Lymphedema, not elsewhere classified: Secondary | ICD-10-CM | POA: Diagnosis not present

## 2016-03-28 DIAGNOSIS — J479 Bronchiectasis, uncomplicated: Secondary | ICD-10-CM | POA: Diagnosis not present

## 2016-03-28 DIAGNOSIS — I10 Essential (primary) hypertension: Secondary | ICD-10-CM | POA: Diagnosis not present

## 2016-03-28 DIAGNOSIS — L03116 Cellulitis of left lower limb: Secondary | ICD-10-CM | POA: Diagnosis not present

## 2016-03-28 DIAGNOSIS — L03115 Cellulitis of right lower limb: Secondary | ICD-10-CM | POA: Diagnosis not present

## 2016-04-01 DIAGNOSIS — I890 Lymphedema, not elsewhere classified: Secondary | ICD-10-CM | POA: Diagnosis not present

## 2016-04-01 DIAGNOSIS — J479 Bronchiectasis, uncomplicated: Secondary | ICD-10-CM | POA: Diagnosis not present

## 2016-04-01 DIAGNOSIS — I10 Essential (primary) hypertension: Secondary | ICD-10-CM | POA: Diagnosis not present

## 2016-04-01 DIAGNOSIS — L03116 Cellulitis of left lower limb: Secondary | ICD-10-CM | POA: Diagnosis not present

## 2016-04-01 DIAGNOSIS — L03115 Cellulitis of right lower limb: Secondary | ICD-10-CM | POA: Diagnosis not present

## 2016-04-01 DIAGNOSIS — F419 Anxiety disorder, unspecified: Secondary | ICD-10-CM | POA: Diagnosis not present

## 2016-04-03 ENCOUNTER — Telehealth: Payer: Self-pay | Admitting: Cardiovascular Disease

## 2016-04-03 NOTE — Telephone Encounter (Signed)
New Message  Wanita ChamberlainJudy Jackson nurse at [facility] is calling needing a copy of prescription for pt.  [phone] (f) Attn: Wanita ChamberlainJudy Jackson Losartan is medication  Please f/u

## 2016-04-03 NOTE — Telephone Encounter (Signed)
Attempted to call Sarah ChamberlainJudy [name] at [facility] to clarify Rx. [facility] on file at patient's last ov. I was on hold > 10 minutes so I hung up. I will try back later.

## 2016-04-04 NOTE — Telephone Encounter (Signed)
Left detailed message for Wanita ChamberlainJudy Jackson that active Rx for #90 supply of Losartan [facility]ills [facility] [address]. I advised her to call back with additional questions or concerns.

## 2016-04-05 ENCOUNTER — Telehealth: Payer: Self-pay | Admitting: Cardiovascular Disease

## 2016-04-05 DIAGNOSIS — I10 Essential (primary) hypertension: Secondary | ICD-10-CM

## 2016-04-05 MED ORDER — LOSARTAN POTASSIUM 50 MG PO TABS: 50.0000 mg | ORAL_TABLET | Freq: Two times a day (BID) | ORAL | 3 refills | Status: DC

## 2016-04-05 NOTE — Telephone Encounter (Signed)
Patient's daughter called back and states that her mother has been taking losartan 50 mg daily. She states that her most current BP at rest is 149/111. Discussed with Dr. Elease HashimotoNahser and was advised to increase patient's losartan to 50 mg BID and not to take additional metoprolol, that she should only take the 12.5 mg BID. Appointment was made for patient to see Dr. Elease HashimotoNahser on 04/10/16 at 2:00 PM. Daughter was instructed to have her mother be seen in the ER if her mother develops any symptoms such as one-sided weakness, slurred speech or difficulty speaking, or changes in vision, etc. Daughter verbalized understanding.

## 2016-04-05 NOTE — Telephone Encounter (Signed)
Pt c/o BP issue: STAT if pt c/o blurred vision, one-sided weakness or slurred speech  1. What are your last 5 BP readings? Last night reading 177/111  2. Are you having any other symptoms (ex. Dizziness, headache, blurred vision, passed out)? Patient mentioned "trouble speaking or getting her words out."  3. What is your BP issue? Consistent elevated BP

## 2016-04-05 NOTE — Telephone Encounter (Signed)
Spoke with daughter Johnny BridgeMartha ([facility] on file). Daughter states that her mother's BP has been elevated 160-170s/ 100s. She states that the other day her mother had difficulty getting her words out, but has since resolved. She denies any one-sided weakness or changes in her vision. She states that her mother has not had any chest pain, SOB, or any other symptoms. She states that she knows that her mother takes metoprolol 12.5 mg BID and that she has been taking an additional 12.5 mg daily since her systolics have been >150. When asked if her mother is taking the losartan 50 mg daily, she stated that she did not know. She states that there are many people involved in the care of her mother. I let her know that there was a prescription for the losartan available at the pharmacy (correct pharmacy verified with daughter). She states that her husband is currently with her mother and that she will call him to see if her mother is taking the losartan and will call back to let me know.

## 2016-04-06 ENCOUNTER — Telehealth: Payer: Self-pay | Admitting: Cardiology

## 2016-04-06 NOTE — Telephone Encounter (Signed)
Daughter, Rayetta PiggMartha [name], called to report continued elevated BP  Midnight 181/113, 5am 153/108, 9 am 165/106, 10:30 184/126.  Yesterday pt had called office for elevated BP's. Losartan was increased to 50 mg bid from daily. She received the first extra dose last night and took this morning's dose at 9am. She is having no one sided weakness, difficulty speaking, head ache, confusion.  Advised daughter that increase in med may take time to get working. Take BP less often as pt is getting anxious about it. Advised to monitor twice a day and can call back if needed. Advised on alarm symptoms to take pt to the ED.   Berton BonJanine Keishaun Hazel, NP

## 2016-04-09 DIAGNOSIS — I10 Essential (primary) hypertension: Secondary | ICD-10-CM | POA: Diagnosis not present

## 2016-04-09 DIAGNOSIS — F411 Generalized anxiety disorder: Secondary | ICD-10-CM | POA: Diagnosis not present

## 2016-04-09 DIAGNOSIS — M79606 Pain in leg, unspecified: Secondary | ICD-10-CM | POA: Diagnosis not present

## 2016-04-09 DIAGNOSIS — A318 Other mycobacterial infections: Secondary | ICD-10-CM | POA: Diagnosis not present

## 2016-04-09 DIAGNOSIS — J479 Bronchiectasis, uncomplicated: Secondary | ICD-10-CM | POA: Diagnosis not present

## 2016-04-09 DIAGNOSIS — G459 Transient cerebral ischemic attack, unspecified: Secondary | ICD-10-CM | POA: Diagnosis not present

## 2016-04-09 DIAGNOSIS — F324 Major depressive disorder, single episode, in partial remission: Secondary | ICD-10-CM | POA: Diagnosis not present

## 2016-04-10 ENCOUNTER — Encounter (INDEPENDENT_AMBULATORY_CARE_PROVIDER_SITE_OTHER): Payer: Self-pay

## 2016-04-10 ENCOUNTER — Ambulatory Visit: Payer: Self-pay | Admitting: Cardiovascular Disease

## 2016-04-10 ENCOUNTER — Ambulatory Visit (INDEPENDENT_AMBULATORY_CARE_PROVIDER_SITE_OTHER): Payer: Medicare Other | Admitting: Cardiovascular Disease

## 2016-04-10 ENCOUNTER — Encounter: Payer: Self-pay | Admitting: Cardiovascular Disease

## 2016-04-10 VITALS — BP 160/90 | HR 80 | Ht 66.0 in | Wt 143.8 lb

## 2016-04-10 DIAGNOSIS — I10 Essential (primary) hypertension: Secondary | ICD-10-CM | POA: Diagnosis not present

## 2016-04-10 DIAGNOSIS — I2510 Atherosclerotic heart disease of native coronary artery without angina pectoris: Secondary | ICD-10-CM | POA: Diagnosis not present

## 2016-04-10 DIAGNOSIS — I739 Peripheral vascular disease, unspecified: Secondary | ICD-10-CM

## 2016-04-10 DIAGNOSIS — I779 Disorder of arteries and arterioles, unspecified: Secondary | ICD-10-CM | POA: Diagnosis not present

## 2016-04-10 MED ORDER — HYDROCHLOROTHIAZIDE 12.5 MG PO CAPS: 12.5000 mg | ORAL_CAPSULE | Freq: Every day | ORAL | 3 refills | Status: DC

## 2016-04-10 MED ORDER — POTASSIUM CHLORIDE ER 10 MEQ PO TBCR: 10.0000 meq | EXTENDED_RELEASE_TABLET | Freq: Every day | ORAL | 3 refills | Status: DC

## 2016-04-10 NOTE — Patient Instructions (Addendum)
Medication Instructions:  TAKE Metoprolol to 12.5 mg twice daily START HCTZ 12.5 mg once daily START Kdur 10 meq once daily   Labwork: Your physician recommends that you return for lab work in: 3 weeks for basic metabolic panel    Testing/Procedures: None Ordered   Follow-Up: Your physician wants you to follow-up in: 6 months with Dr. Elease HashimotoNahser.  You will receive a reminder letter in the mail two months in advance. If you don't receive a letter, please call our office to schedule the follow-up appointment.   If you need a refill on your cardiac medications before your next appointment, please call your pharmacy.   Thank you for choosing [facility]! Eligha BridegroomMichelle Swinyer, RN [phone]

## 2016-04-10 NOTE — Progress Notes (Signed)
Sarah [name] Date of Birth: [birth year] Medical Record #[id number]  Problem list: 1. Hypertension 2. Asthma 3. Depression 4. Coronary artery disease-status post non-ST segment elevation myocardial infarction. Cardiac cath revealed nonobstructive coronary artery disease 5. Bronchiectasis / atypical mycobacterium infection   History of Present Illness: [name] is seen back today following a recent hospitalization. She was admitted to the hospital with chest tightness. She ruled out for myocardial infarction. She has a history of chest pain in the past and a cardiac catheterization in 2012 was unremarkable.  She originally called EMS when her BP was 220/110  She has had significant difficulty controlling her BP recently.   She has been watching her salt.  She has taken amlodipine and Lisinopril in the past but these have been stopped because she seemed to be overmedicated.    She continues to have intermittent episodes of chest pressure and heaviness.  These are typically associated with marked hypertension although it is not clear whether the hypertension occurs first or as a result of her  feeling poorly.  Jun 11, 2012:  Mrs. Sarah [name] was seen by Truitt Merle in February, 2014. She had been measuring her blood pressure at least 10 times every day and was worried because her blood pressure seemed to be somewhat labile.  She feels poorly today.  She did not sleep well last night.  She had a headache.   She has lots of anxiety issues.  She takes xanax with some relief.    June 2 , 2015:  [name] is doing ok.  Upset about having to put her husband in an memory care SNF.  Lots of guilt about that.    Has a home sitter with her today. No significant CP or dyspnea.  She has some anxiety - has attacks on occasion.  Her anxiety seems to start in her chest as a CP.  If she takes the xanax and Tylenol #3 in time, she can avoid the panic attacks.    She thinks she had 3 TIAs this past year.   Carotid duplex was negative.  MRI was normal.    She has been on 1 ASA 325 mg a day.   Jan. 31, 2017:  Patient is ding well.   BP and HR look great today Still short of breath .  Has pulmonary issues.   Sees [name] .   Jan. 30, 2018:    Doing well from a cardiac .    Just getting over an episode of cellulitis and lymphedema .  No further CP   BP has been a little high.  She thinks she may have had a TIA on Saturday .    She felt strange in her head,  Could not speak,  Had double vision,  Took her BP - 199/110    Current Outpatient Prescriptions on File Prior to Visit  Medication Sig Dispense Refill  . ALPRAZolam (XANAX) 0.5 MG tablet Take 1 tablet (0.5 mg total) by mouth 2 (two) times daily.  0  . atorvastatin (LIPITOR) 10 MG tablet Take 1 tablet (10 mg total) by mouth daily at 6 PM. 30 tablet 1  . calcium carbonate (TUMS - DOSED IN MG ELEMENTAL CALCIUM) 500 MG chewable tablet Chew 1 tablet by mouth every 8 (eight) hours as needed for indigestion or heartburn.    . clonazePAM (KLONOPIN) 0.5 MG tablet Take 0.25 mg by mouth at bedtime.     . clopidogrel (PLAVIX) 75 MG tablet Take 1 tablet (  75 mg total) by mouth daily. 30 tablet 1  . doxycycline (VIBRA-TABS) 100 MG tablet Take 1 tablet (100 mg total) by mouth 2 (two) times daily. Take this the 20th of April, July, October, January. 14 tablet 3  . DULoxetine (CYMBALTA) 20 MG capsule Take 20 mg by mouth 3 (three) times daily.    Marland Kitchen escitalopram (LEXAPRO) 5 MG tablet Take 2.5 mg by mouth daily.    Marland Kitchen gabapentin (NEURONTIN) 100 MG capsule Take 100 mg by mouth daily.    Marland Kitchen lactulose (CHRONULAC) 10 GM/15ML solution Take 15 mLs (10 g total) by mouth 2 (two) times daily as needed for moderate constipation. 240 mL 0  . latanoprost (XALATAN) 0.005 % ophthalmic solution Place 1 drop into both eyes at bedtime.     . levalbuterol (XOPENEX HFA) 45 MCG/ACT inhaler Inhale 1-2 puffs into the lungs every 6 (six) hours as needed for wheezing.    .  levalbuterol (XOPENEX) 1.25 MG/3ML nebulizer solution Take 1.25 mg by nebulization 2 (two) times daily. At 1pm and 7pm 180 mL 5  . levothyroxine (SYNTHROID, LEVOTHROID) 50 MCG tablet Take 50 mcg by mouth daily.      Marland Kitchen loratadine (CLARITIN) 10 MG tablet Take 10 mg by mouth daily.    Marland Kitchen losartan (COZAAR) 50 MG tablet Take 1 tablet (50 mg total) by mouth 2 (two) times daily. 180 tablet 3  . metoprolol tartrate (LOPRESSOR) 25 MG tablet Take 12.5 mg by mouth 2 (two) times daily. May take an additional dose once daily if needed for systolic bp above 175    . pantoprazole (PROTONIX) 40 MG tablet Take 1 capsule 1 hour before your first meal of the day 30 tablet 5  . predniSONE (DELTASONE) 5 MG tablet TAKE 1 TABLET BY MOUTH ONCE DAILY WITH BREAKFAST. 30 tablet 2   No current facility-administered medications on file prior to visit.     Allergies  Allergen Reactions  . Codeine Anaphylaxis  . Hydrocodone Nausea And Vomiting and Other (See Comments)    SYNCOPE AND BRADYCARDIA  . Isosorbide Nitrate Other (See Comments)    BRADYCARDIA  . Oxycodone Palpitations    Rapid heart beat  . Clarithromycin Nausea And Vomiting  . Fiorinal [Butalbital-Aspirin-Caffeine] Swelling and Other (See Comments)    Facial swelling   . Levofloxacin Nausea And Vomiting and Other (See Comments)    SYNCOPE ALSO  . Augmentin [Amoxicillin-Pot Clavulanate] Diarrhea    Past Medical History:  Diagnosis Date  . Allergic rhinitis   . Anxiety    Anxiety attacks  . Arthritis    "a little; in my back and hands" (04/15/2012)  . Asthma   . Bronchiectasis    with history of Legionaires with chronic rales/rhonchi  . Chest pain at rest   . Daily headache    "over the last week" 04/15/2012   . Depression   . Diastolic dysfunction    a. per echo 08/2010 with normal LV function;  b. 06/2011 Echo: EF 65-70%  . GERD (gastroesophageal reflux disease)    "just recently" (04/15/2012)  . H/O hiatal hernia   . H/O Legionnaire's disease  11/1976  . History of blood transfusion 1972   "w/hysterectomy" (04/15/2012)  . Hypercholesteremia   . Hypertension    Negative renal duplex in December of 2012  . Hypothyroidism   . Idiopathic peripheral neuropathy   . MAC (mycobacterium avium-intracellulare complex)   . Migraines    "outgrew them" (04/15/2012)  . NSTEMI (non-ST elevated myocardial infarction) ([place])  a. Normal coronaries per cath August 2012 and negative CT angio for PE;  b. 06/2011 Repeat admission w/ chest pain and elevated troponin's, CTA Chest w/o PE  . Osteopenia 02/2011   t score - 2.1  . Pneumonia    "recurrent" (04/15/2012)  . Pollen allergies   . Shortness of breath    "sometimes just lying down" (04/15/2012)  . Stroke [facility]) 06/2014   cerebellum    Past Surgical History:  Procedure Laterality Date  . APPENDECTOMY    . CARDIAC CATHETERIZATION  August 2012   Normal coronaries.  Marland Kitchen DILATION AND CURETTAGE OF UTERUS    . TONSILLECTOMY  1930  . TOTAL ABDOMINAL HYSTERECTOMY W/ BILATERAL SALPINGOOPHORECTOMY  1972   leiomyomata, menorrhagia    History  Smoking Status  . Never Smoker  Smokeless Tobacco  . Never Used    History  Alcohol Use  . 1.2 oz/week  . 2 Glasses of wine per week    Comment: wine 1-2 week    Family History  Problem Relation Age of Onset  . Hypertension Mother     stroke  . Stroke Mother   . Cancer Brother     prostate  . Osteoarthritis Paternal Aunt     Review of Systems: The review of systems is per the HPI.  All other systems were reviewed and are negative.  Physical Exam: Ht 5' 6"  (1.676 m)   Wt 143 lb 12.8 oz (65.2 kg)   LMP 01/14/1970   BMI 23.21 kg/m  Patient is very pleasant and in no acute distress. Skin is warm and dry. Color is normal.  HEENT is unremarkable. Normocephalic/atraumatic. PERRL. Sclera are nonicteric. Neck is supple. No masses. No JVD. Lungs show some persistant rales / rhonchi which are chronic. Cardiac exam shows a regular rate and rhythm.  Abdomen is soft. Extremities are without edema. Gait and ROM are intact. No gross neurologic deficits noted.  LABORATORY DATA:  Lab Results  Component Value Date   WBC 16.9 (H) 10/06/2014   HGB 9.7 (L) 10/06/2014   HCT 32.4 (L) 10/06/2014   PLT 227 10/06/2014   GLUCOSE 91 03/12/2016   CHOL 179 07/09/2014   TRIG 66 07/09/2014   HDL 67 07/09/2014   LDLCALC 99 07/09/2014   ALT 15 10/06/2014   AST 30 10/06/2014   NA 141 03/12/2016   K 4.5 03/12/2016   CL 101 03/12/2016   CREATININE 0.93 03/12/2016   BUN 16 03/12/2016   CO2 23 03/12/2016   TSH 1.40 03/21/2015   INR 1.10 07/08/2014   HGBA1C 6.0 (H) 07/09/2014   ECG:  Jan. 30, 2018:    NSR at 75.  Normal ECG   Assessment / Plan:  1. Hypertension- Her blood pressure is A little bit higher than she would like. Continue losartan. We will add HCTZ 12.5 mg twice a day and potassium chloride 10 mEq a day. We will check a basic medical profile in 3 weeks.  2. Possible TIA :   She had symptoms on Saturday better consistent with a possible TIA. She had transient double vision and was not able to speak. She had a carotid duplex scan 2 years ago that revealed mild bilateral carotid irregularities. She is on Plavix.  I will discussion with her daughter. I do not think that she is a good candidate for carotid endarterectomy saw not sure that it's productive to do repeat carotid duplex scans at this time. We'll continue with current dose of Plavix. She'll call me  back if she has recurrent symptoms consistent with TIA.  3. Depression  4. Coronary artery disease-status post non-ST segment elevation myocardial infarction. Cardiac cath revealed nonobstructive coronary artery disease  5. Bronchiectasis / atypical mycobacterium infection  Followed by Leroy Sea, MD  04/10/2016 2:15 PM    [address],  [place], [place]  [address] Pager [phone] Phone: [phone]; Fax: [phone]

## 2016-04-20 ENCOUNTER — Emergency Department (HOSPITAL_COMMUNITY)
Admission: EM | Admit: 2016-04-20 | Discharge: 2016-04-20 | Disposition: A | Discharge status: Home / Self Care | Payer: Medicare Other | Attending: Emergency Medicine | Admitting: Emergency Medicine

## 2016-04-20 ENCOUNTER — Encounter (HOSPITAL_COMMUNITY): Payer: Self-pay | Admitting: Emergency Medicine

## 2016-04-20 DIAGNOSIS — I252 Old myocardial infarction: Secondary | ICD-10-CM | POA: Insufficient documentation

## 2016-04-20 DIAGNOSIS — Z8673 Personal history of transient ischemic attack (TIA), and cerebral infarction without residual deficits: Secondary | ICD-10-CM | POA: Diagnosis not present

## 2016-04-20 DIAGNOSIS — I5032 Chronic diastolic (congestive) heart failure: Secondary | ICD-10-CM | POA: Diagnosis not present

## 2016-04-20 DIAGNOSIS — I110 Hypertensive heart disease with heart failure: Secondary | ICD-10-CM | POA: Insufficient documentation

## 2016-04-20 DIAGNOSIS — R531 Weakness: Secondary | ICD-10-CM | POA: Diagnosis not present

## 2016-04-20 DIAGNOSIS — J45909 Unspecified asthma, uncomplicated: Secondary | ICD-10-CM | POA: Insufficient documentation

## 2016-04-20 DIAGNOSIS — R404 Transient alteration of awareness: Secondary | ICD-10-CM | POA: Diagnosis not present

## 2016-04-20 DIAGNOSIS — R55 Syncope and collapse: Secondary | ICD-10-CM | POA: Insufficient documentation

## 2016-04-20 DIAGNOSIS — E039 Hypothyroidism, unspecified: Secondary | ICD-10-CM | POA: Diagnosis not present

## 2016-04-20 NOTE — ED Triage Notes (Signed)
Per EMS, pt from [facility], at 0215 this morning pt was attempting to have a BM, hasn't had one in 2 days. Pt reports she passed out on the toilet. Pt was found diaphoretic and drooling on the toilet after pressing the call bell. Slight slurred speech is normal. Pt c/o of 5/10 abd pain, generalized weakness. Pt reports she tried a second time before EMS arrival and was able to have some BM.

## 2016-04-20 NOTE — ED Notes (Signed)
Nurse getting labs 

## 2016-04-20 NOTE — ED Notes (Signed)
ED Provider at bedside. 

## 2016-04-20 NOTE — ED Provider Notes (Signed)
[facility] DEPT Provider Note   CSN: [id number] Arrival date & time: 04/20/16  5009   By signing my name below, I, Delton Prairie, attest that this documentation has been prepared under the direction and in the presence of Deno Etienne, DO  Electronically Signed: Delton Prairie, ED Scribe. 04/20/16. 3:53 AM.   History   Chief Complaint Chief Complaint  Patient presents with  . Loss of Consciousness    HPI Comments:  Sarah [name] is a 83 y.o. female, with a PMHx of HTN, TIA and CHF, who presents to the Emergency Department, via EMS from [facility], s/p a syncopal episode which occurred PTA. Pt states she was sitting on the toilet and straining to have a bowel movement when she passed out. Pt also reports mild abdominal discomfort and generalized weakness. Pt states she felt fine before this episode and relative notes the pt was walking and shopping earlier yesterday. No alleviating or aggravating factors noted. Pt denies fevers, diarrhea, nausea, vomiting, headaches or any other associated symptoms. No other complaints noted.    The history is provided by the patient. No language interpreter was used.  Loss of Consciousness   This is a new problem. The current episode started less than 1 hour ago. The problem has been resolved. She lost consciousness for a period of 1 to 5 minutes. The problem is associated with bowel movements. Associated symptoms include weakness. Pertinent negatives include abdominal pain, chest pain, congestion, dizziness, fever, headaches, nausea, palpitations and vomiting. She has tried nothing for the symptoms. The treatment provided no relief. Her past medical history is significant for HTN and TIA.    Past Medical History:  Diagnosis Date  . Allergic rhinitis   . Anxiety    Anxiety attacks  . Arthritis    "a little; in my back and hands" (04/15/2012)  . Asthma   . Bronchiectasis    with history of Legionaires with chronic rales/rhonchi    . Chest pain at rest   . Daily headache    "over the last week" 04/15/2012   . Depression   . Diastolic dysfunction    a. per echo 08/2010 with normal LV function;  b. 06/2011 Echo: EF 65-70%  . GERD (gastroesophageal reflux disease)    "just recently" (04/15/2012)  . H/O hiatal hernia   . H/O Legionnaire's disease 11/1976  . History of blood transfusion 1972   "w/hysterectomy" (04/15/2012)  . Hypercholesteremia   . Hypertension    Negative renal duplex in December of 2012  . Hypothyroidism   . Idiopathic peripheral neuropathy   . MAC (mycobacterium avium-intracellulare complex)   . Migraines    "outgrew them" (04/15/2012)  . NSTEMI (non-ST elevated myocardial infarction) ([place])    a. Normal coronaries per cath August 2012 and negative CT angio for PE;  b. 06/2011 Repeat admission w/ chest pain and elevated troponin's, CTA Chest w/o PE  . Osteopenia 02/2011   t score - 2.1  . Pneumonia    "recurrent" (04/15/2012)  . Pollen allergies   . Shortness of breath    "sometimes just lying down" (04/15/2012)  . Stroke [facility]) 06/2014   cerebellum    Patient Active Problem List   Diagnosis Date Noted  . Adrenal insufficiency ([place]) 03/21/2015  . HTN (hypertension) 02/14/2015  . Fecal impaction ([place]) 10/07/2014  . Colitis 10/06/2014  . Cerebral infarction due to thrombosis of basilar artery ([place]) 09/09/2014  . Aneurysm, cerebral, nonruptured 09/09/2014  . HLD (hyperlipidemia) 09/09/2014  . Acute  ischemic stroke ([place]) 07/09/2014  . Chronic diastolic CHF (congestive heart failure) ([place]) 07/09/2014  . Stroke ([place]) 07/08/2014  . TIA (transient ischemic attack) 07/08/2014  . Chest pain 04/15/2012  . Shortness of breath dyspnea 11/23/2011  . Anxiety 11/23/2011  . Malignant hypertension 11/23/2011  . Elevated troponin 06/20/2011  . Depression   . Hypothyroidism   . Arthritis   . Racing heart beat 02/11/2011  . NSTEMI (non-ST elevated myocardial infarction) ([place]) 09/24/2010  . Idiopathic  peripheral neuropathy   . Pollen allergies   . BRONCHIECTASIS 07/29/2008  . Essential hypertension 06/15/2008  . Allergic rhinitis 06/15/2008  . Asthma 06/15/2008  . Cough 06/15/2008    Past Surgical History:  Procedure Laterality Date  . APPENDECTOMY    . CARDIAC CATHETERIZATION  August 2012   Normal coronaries.  Marland Kitchen DILATION AND CURETTAGE OF UTERUS    . TONSILLECTOMY  1930  . TOTAL ABDOMINAL HYSTERECTOMY W/ BILATERAL SALPINGOOPHORECTOMY  1972   leiomyomata, menorrhagia    OB History    Gravida Para Term Preterm AB Living   _0 SAB TAB Ectopic Multiple Live Births                   Home Medications    Prior to Admission medications   Medication Sig Start Date End Date Taking? Authorizing Provider  ALPRAZolam Duanne Moron) 0.5 MG tablet Take 1 tablet (0.5 mg total) by mouth 2 (two) times daily. 10/08/14   Costin Karlyne Greenspan, MD  atorvastatin (LIPITOR) 10 MG tablet Take 1 tablet (10 mg total) by mouth daily at 6 PM. 07/10/14   Orson Eva, MD  calcium carbonate (TUMS - DOSED IN MG ELEMENTAL CALCIUM) 500 MG chewable tablet Chew 1 tablet by mouth every 8 (eight) hours as needed for indigestion or heartburn.    Historical Provider, MD  clonazePAM (KLONOPIN) 0.5 MG tablet Take 0.25 mg by mouth at bedtime.  04/06/13   Historical Provider, MD  clopidogrel (PLAVIX) 75 MG tablet Take 1 tablet (75 mg total) by mouth daily. 07/10/14   Orson Eva, MD  doxycycline (VIBRA-TABS) 100 MG tablet Take 1 tablet (100 mg total) by mouth 2 (two) times daily. Take this the 20th of April, July, October, January. 03/05/16   Collene Gobble, MD  DULoxetine (CYMBALTA) 20 MG capsule Take 20 mg by mouth 3 (three) times daily. 06/14/14   Historical Provider, MD  escitalopram (LEXAPRO) 5 MG tablet Take 2.5 mg by mouth daily.    Historical Provider, MD  gabapentin (NEURONTIN) 100 MG capsule Take 100 mg by mouth daily. 02/05/16   Historical Provider, MD  hydrochlorothiazide (MICROZIDE) 12.5 MG capsule Take 1 capsule  (12.5 mg total) by mouth daily. 04/10/16 07/09/16  Thayer Headings, MD  lactulose (CHRONULAC) 10 GM/15ML solution Take 15 mLs (10 g total) by mouth 2 (two) times daily as needed for moderate constipation. 10/08/14   Costin Karlyne Greenspan, MD  latanoprost (XALATAN) 0.005 % ophthalmic solution Place 1 drop into both eyes at bedtime.  08/30/14   Historical Provider, MD  levalbuterol Penne Lash HFA) 45 MCG/ACT inhaler Inhale 1-2 puffs into the lungs every 6 (six) hours as needed for wheezing.    Historical Provider, MD  levalbuterol Penne Lash) 1.25 MG/3ML nebulizer solution Take 1.25 mg by nebulization 2 (two) times daily. At 1pm and 7pm 05/05/15   Collene Gobble, MD  levothyroxine (SYNTHROID, LEVOTHROID) 50 MCG tablet Take 50 mcg by mouth daily.  Historical Provider, MD  loratadine (CLARITIN) 10 MG tablet Take 10 mg by mouth daily.    Historical Provider, MD  losartan (COZAAR) 50 MG tablet Take 1 tablet (50 mg total) by mouth 2 (two) times daily. 04/05/16 07/04/16  Thayer Headings, MD  metoprolol tartrate (LOPRESSOR) 25 MG tablet Take 12.5 mg by mouth 2 (two) times daily. May take an additional dose once daily if needed for systolic bp above 096    Historical Provider, MD  pantoprazole (PROTONIX) 40 MG tablet Take 1 capsule 1 hour before your first meal of the day 08/08/15   Collene Gobble, MD  potassium chloride (K-DUR) 10 MEQ tablet Take 1 tablet (10 mEq total) by mouth daily. 04/10/16 07/09/16  Thayer Headings, MD  predniSONE (DELTASONE) 5 MG tablet TAKE 1 TABLET BY MOUTH ONCE DAILY WITH BREAKFAST. 08/30/15   Collene Gobble, MD    Family History Family History  Problem Relation Age of Onset  . Hypertension Mother     stroke  . Stroke Mother   . Cancer Brother     prostate  . Osteoarthritis Paternal Aunt     Social History Social History  Substance Use Topics  . Smoking status: Never Smoker  . Smokeless tobacco: Never Used  . Alcohol use 1.2 oz/week    2 Glasses of wine per week     Comment: wine 1-2  week     Allergies   Codeine; Hydrocodone; Isosorbide nitrate; Oxycodone; Clarithromycin; Fiorinal [butalbital-aspirin-caffeine]; Levofloxacin; and Augmentin [amoxicillin-pot clavulanate]   Review of Systems Review of Systems  Constitutional: Negative for chills and fever.  HENT: Negative for congestion and rhinorrhea.   Eyes: Negative for redness and visual disturbance.  Respiratory: Negative for shortness of breath and wheezing.   Cardiovascular: Positive for syncope. Negative for chest pain and palpitations.  Gastrointestinal: Negative for abdominal pain, diarrhea, nausea and vomiting.  Genitourinary: Negative for dysuria and urgency.  Musculoskeletal: Negative for arthralgias and myalgias.  Skin: Negative for pallor and wound.  Neurological: Positive for syncope and weakness. Negative for dizziness and headaches.     Physical Exam Updated Vital Signs BP 133/75   Pulse 70   Temp 97.5 F (36.4 C) (Oral)   Resp 20   Ht _0  (1.676 m)   Wt 143 lb (64.9 kg)   LMP 01/14/1970   SpO2 98%   BMI 23.08 kg/m   Physical Exam  Constitutional: She is oriented to person, place, and time. She appears well-developed and well-nourished. No distress.  Cachectic. Appears well hydrated   HENT:  Head: Normocephalic and atraumatic.  Eyes: EOM are normal. Pupils are equal, round, and reactive to light.  Neck: Normal range of motion. Neck supple.  Cardiovascular: Normal rate and regular rhythm.  Exam reveals no gallop and no friction rub.   No murmur heard. Pulmonary/Chest: Effort normal. She has no wheezes. She has no rales.  Abdominal: Soft. She exhibits no distension. There is no tenderness.  Musculoskeletal: She exhibits no edema or tenderness.  Neurological: She is alert and oriented to person, place, and time.  Skin: Skin is warm and dry. She is not diaphoretic.  Psychiatric: She has a normal mood and affect. Her behavior is normal.  Nursing note and vitals reviewed.    ED  Treatments / Results  DIAGNOSTIC STUDIES:  Oxygen Saturation is 97% on RA, normal by my interpretation.    COORDINATION OF CARE:  3:47 AM Discussed treatment plan with pt at bedside and pt agreed to  plan.  Labs (all labs ordered are listed, but only abnormal results are displayed) Labs Reviewed - No data to display  EKG  EKG Interpretation  Date/Time:  Saturday April 20 2016 03:25:18 EDT Ventricular Rate:  71 PR Interval:    QRS Duration: 98 QT Interval:  436 QTC Calculation: 474 R Axis:   8 Text Interpretation:  Sinus rhythm No significant change since last tracing Confirmed by Suzzette Gasparro MD, DANIEL ([id number]) on 04/20/2016 3:31:27 AM       Radiology No results found.  Procedures Procedures (including critical care time)  Medications Ordered in ED Medications - No data to display   Initial Impression / Assessment and Plan / ED Course  I have reviewed the triage vital signs and the nursing notes.  Pertinent labs & imaging results that were available during my care of the patient were reviewed by me and considered in my medical decision making (see chart for details).     83 yo F with syncopal event while having a bowel movement.  Patient Otherwise had been doing well today. Was on outing all afternoon. Denied any chest pain shortness breath headaches neck pain. She was having some abdominal pain that improved with her bowel movement. Well-appearing and nontoxic on my exam. EKG with no significant findings. The patient is still not back to 100% I offered a laboratory evaluation. This time patient is okay with normal exam EKG. She'll return for worsening symptoms.  4:18 AM:  I have discussed the diagnosis/risks/treatment options with the patient and family and believe the pt to be eligible for discharge home to follow-up with PCP. We also discussed returning to the ED immediately if new or worsening sx occur. We discussed the sx which are most concerning (e.g., sudden worsening  pain, fever, inability to tolerate by mouth) that necessitate immediate return. Medications administered to the patient during their visit and any new prescriptions provided to the patient are listed below.  Medications given during this visit Medications - No data to display   The patient appears reasonably screen and/or stabilized for discharge and I doubt any other medical condition or other [facility] requiring further screening, evaluation, or treatment in the ED at this time prior to discharge.    Final Clinical Impressions(s) / ED Diagnoses   Final diagnoses:  Vasovagal syncope    New Prescriptions New Prescriptions   No medications on file   I personally performed the services described in this documentation, which was scribed in my presence. The recorded information has been reviewed and is accurate.     Deno Etienne, DO 04/20/16 [phone]

## 2016-04-20 NOTE — Discharge Instructions (Signed)
Follow up with your family doc.  °

## 2016-04-22 DIAGNOSIS — Z8673 Personal history of transient ischemic attack (TIA), and cerebral infarction without residual deficits: Secondary | ICD-10-CM | POA: Diagnosis not present

## 2016-04-22 DIAGNOSIS — M25561 Pain in right knee: Secondary | ICD-10-CM | POA: Diagnosis not present

## 2016-04-22 DIAGNOSIS — M25562 Pain in left knee: Secondary | ICD-10-CM | POA: Diagnosis not present

## 2016-04-22 DIAGNOSIS — J479 Bronchiectasis, uncomplicated: Secondary | ICD-10-CM | POA: Diagnosis not present

## 2016-04-22 DIAGNOSIS — F324 Major depressive disorder, single episode, in partial remission: Secondary | ICD-10-CM | POA: Diagnosis not present

## 2016-04-22 DIAGNOSIS — R55 Syncope and collapse: Secondary | ICD-10-CM | POA: Diagnosis not present

## 2016-04-22 DIAGNOSIS — Z7952 Long term (current) use of systemic steroids: Secondary | ICD-10-CM | POA: Diagnosis not present

## 2016-04-22 DIAGNOSIS — M79604 Pain in right leg: Secondary | ICD-10-CM | POA: Diagnosis not present

## 2016-04-22 DIAGNOSIS — I10 Essential (primary) hypertension: Secondary | ICD-10-CM | POA: Diagnosis not present

## 2016-04-22 DIAGNOSIS — M79605 Pain in left leg: Secondary | ICD-10-CM | POA: Diagnosis not present

## 2016-04-22 DIAGNOSIS — F410 Panic disorder [episodic paroxysmal anxiety] without agoraphobia: Secondary | ICD-10-CM | POA: Diagnosis not present

## 2016-04-23 ENCOUNTER — Ambulatory Visit (INDEPENDENT_AMBULATORY_CARE_PROVIDER_SITE_OTHER): Payer: Medicare Other | Admitting: Psychiatry

## 2016-04-23 DIAGNOSIS — R55 Syncope and collapse: Secondary | ICD-10-CM | POA: Diagnosis not present

## 2016-04-23 DIAGNOSIS — F410 Panic disorder [episodic paroxysmal anxiety] without agoraphobia: Secondary | ICD-10-CM

## 2016-04-23 DIAGNOSIS — F4322 Adjustment disorder with anxiety: Secondary | ICD-10-CM

## 2016-04-23 DIAGNOSIS — I10 Essential (primary) hypertension: Secondary | ICD-10-CM | POA: Diagnosis not present

## 2016-04-23 DIAGNOSIS — J471 Bronchiectasis with (acute) exacerbation: Secondary | ICD-10-CM | POA: Diagnosis not present

## 2016-04-25 ENCOUNTER — Telehealth: Payer: Self-pay | Admitting: Cardiovascular Disease

## 2016-04-25 DIAGNOSIS — M25561 Pain in right knee: Secondary | ICD-10-CM | POA: Diagnosis not present

## 2016-04-25 DIAGNOSIS — I10 Essential (primary) hypertension: Secondary | ICD-10-CM | POA: Diagnosis not present

## 2016-04-25 DIAGNOSIS — F324 Major depressive disorder, single episode, in partial remission: Secondary | ICD-10-CM | POA: Diagnosis not present

## 2016-04-25 DIAGNOSIS — J479 Bronchiectasis, uncomplicated: Secondary | ICD-10-CM | POA: Diagnosis not present

## 2016-04-25 DIAGNOSIS — R55 Syncope and collapse: Secondary | ICD-10-CM | POA: Diagnosis not present

## 2016-04-25 DIAGNOSIS — M25562 Pain in left knee: Secondary | ICD-10-CM | POA: Diagnosis not present

## 2016-04-25 NOTE — Telephone Encounter (Signed)
I would agree to decrease the HCTZ to every other day and stop the potassium Have her continue to record her BP

## 2016-04-25 NOTE — Telephone Encounter (Signed)
New message    Alona Bene from Dr. James Ivanoff office is calling about the pt. They faxed lab results of a BMET to Dr. Elease Hashimoto. The PT was seen in ER for vagal syncope after starting HCTZ. Pt had appt there on 4/10 her bp was 120/70. Dr. Zollie Beckers says that pt does not like HCTZ and she does not like the size of potassium. Pt and Dr. Zollie Beckers wants to know if it would be ok to take the HCTZ every other day and stop the potassium completely. They will call the pts aide and check on her BP every other day and report the readings back to Dr. Zollie Beckers or Dr. Melburn Popper.

## 2016-04-26 ENCOUNTER — Ambulatory Visit (INDEPENDENT_AMBULATORY_CARE_PROVIDER_SITE_OTHER): Payer: Medicare Other | Admitting: Emergency Medicine

## 2016-04-26 ENCOUNTER — Encounter: Payer: Self-pay | Admitting: Emergency Medicine

## 2016-04-26 DIAGNOSIS — I2510 Atherosclerotic heart disease of native coronary artery without angina pectoris: Secondary | ICD-10-CM

## 2016-04-26 DIAGNOSIS — J479 Bronchiectasis, uncomplicated: Secondary | ICD-10-CM | POA: Diagnosis not present

## 2016-04-26 MED ORDER — AZITHROMYCIN 250 MG PO TABS: ORAL_TABLET | ORAL | 2 refills | Status: DC

## 2016-04-26 MED ORDER — DOXYCYCLINE HYCLATE 100 MG PO TABS: 100.0000 mg | ORAL_TABLET | Freq: Two times a day (BID) | ORAL | 2 refills | Status: DC

## 2016-04-26 MED ORDER — HYDROCHLOROTHIAZIDE 12.5 MG PO CAPS: 12.5000 mg | ORAL_CAPSULE | ORAL | 3 refills | Status: DC

## 2016-04-26 NOTE — Progress Notes (Signed)
   Subjective:    Patient ID: [name], female    DOB: [birth year], 83 y.o.   MRN: [id number]  HPI 83 yo never smoker, hx remote Legionella PNA, diagnosed with suspected asthma over 10 years ago. PFT's 7/10 with mixed disease, mild AFL. Formerly treated with Advair and albuterol, stopped when she moved to [place] in 2008. Stayed on Singulair. At original consult we d/c'd ACE-I. Started on Symbicort.    ROV 03/05/16 -- Mrs. Whiteaker has a history of bronchiectasis and upper airway irritation with chronic cough, probably also true asthma. She's been managed on chronic prednisone, Protonix. She's had multiple episodes of acute bronchitic syndrome, most recently treated 02/22/16. She increased her prednisone to 10 mg daily for 1 week and took doxycycline. She is now back down to prednisone 5 mg daily.  She has been rx with serial abx (also for a cellulitis) since November.   ROV 04/26/16 -- follow up visit for bronchiectasis, chronic cough, UA irritation, some true asthma. She has been managed on chronic pred at  qd. She has reliable bouts of cough. Happen daily. She is on rotating doxycycline and azithromycin at the beginning of each month. The sputum is a bit lighter. She uses flutter valve but not reliable.   Review of Systems As above       Objective:   Physical Exam  Vitals:   04/26/16 1614  BP: (!) 180/102  Pulse: 94  SpO2: 93%  Weight: 143 lb (64.9 kg)  Height:  (1.676 m)    GEN: A/Ox3; pleasant , NAD, elderly and thin, coughing up pale yellow mucous.    HEENT:  Op clear,   NECK:  No stridor  RESP  Few B exp wheezes  CARD:  RRR, no m/r/g  , no peripheral edema, pulses intact, no cyanosis or clubbing.  Musco: Warm bil, no deformities or joint swelling noted.   Neuro: alert, no focal deficits noted.    Skin: Warm, no lesions or rashes      Assessment & Plan:  BRONCHIECTASIS Reliably has to work to clear secretions at least once a day. The rotating abx appear to have  helped. She remains on pred 5. Use xopenex, gets some tremor from it but it does help her clear mucous. We will remove levaquin from the abx rotation because she did not tolerate > N/V. Discussed using flutter valve and her nebs at the times she needs to clear her secretions.   Please continue to use your xopenex nebulizer as needed for coughing or wheezing.  We will continue to rotate your antibiotics at the beginning of each month: doxycycline and azithromycin.  Continue your prednisone  daily.  Follow with Dr Delton Coombes in 3 months or sooner if you have any problems.  Levy Pupa, MD, PhD 04/26/2016, 4:33 PM [facility] [phone] or if no answer [phone]

## 2016-04-26 NOTE — Addendum Note (Signed)
Addended by: Cydney Ok on: 04/26/2016 05:01 PM   Modules accepted: Orders

## 2016-04-26 NOTE — Telephone Encounter (Signed)
Reviewed Dr. Harvie Bridge agreement with medication changes with patient's daughter, Sharyl Nimrod who verbalized understanding and agreement. She will let patient know of Dr. Harvie Bridge agreement with the plan in person because the patient is hard of hearing.  I have updated the patient's medication list and cancelled the lab appointment for next week because lab work was done at Dr. James Ivanoff office. Sharyl Nimrod thanked me for the call.

## 2016-04-26 NOTE — Assessment & Plan Note (Signed)
Reliably has to work to clear secretions at least once a day. The rotating abx appear to have helped. She remains on pred 5. Use xopenex, gets some tremor from it but it does help her clear mucous. We will remove levaquin from the abx rotation because she did not tolerate > N/V. Discussed using flutter valve and her nebs at the times she needs to clear her secretions.   Please continue to use your xopenex nebulizer as needed for coughing or wheezing.  We will continue to rotate your antibiotics at the beginning of each month: doxycycline and azithromycin.  Continue your prednisone  daily.  Follow with Dr Delton Coombes in 3 months or sooner if you have any problems.

## 2016-04-26 NOTE — Patient Instructions (Signed)
Please continue to use your xopenex nebulizer as needed for coughing or wheezing.  We will continue to rotate your antibiotics at the beginning of each month: doxycycline and azithromycin.  Continue your prednisone  daily.  Follow with Dr Delton Coombes in 3 months or sooner if you have any problems.

## 2016-04-30 ENCOUNTER — Other Ambulatory Visit: Payer: Medicare Other

## 2016-04-30 DIAGNOSIS — J479 Bronchiectasis, uncomplicated: Secondary | ICD-10-CM | POA: Diagnosis not present

## 2016-04-30 DIAGNOSIS — F324 Major depressive disorder, single episode, in partial remission: Secondary | ICD-10-CM | POA: Diagnosis not present

## 2016-04-30 DIAGNOSIS — M25562 Pain in left knee: Secondary | ICD-10-CM | POA: Diagnosis not present

## 2016-04-30 DIAGNOSIS — M25561 Pain in right knee: Secondary | ICD-10-CM | POA: Diagnosis not present

## 2016-04-30 DIAGNOSIS — R55 Syncope and collapse: Secondary | ICD-10-CM | POA: Diagnosis not present

## 2016-04-30 DIAGNOSIS — I10 Essential (primary) hypertension: Secondary | ICD-10-CM | POA: Diagnosis not present

## 2016-05-02 DIAGNOSIS — R55 Syncope and collapse: Secondary | ICD-10-CM | POA: Diagnosis not present

## 2016-05-02 DIAGNOSIS — M25561 Pain in right knee: Secondary | ICD-10-CM | POA: Diagnosis not present

## 2016-05-02 DIAGNOSIS — I10 Essential (primary) hypertension: Secondary | ICD-10-CM | POA: Diagnosis not present

## 2016-05-02 DIAGNOSIS — F324 Major depressive disorder, single episode, in partial remission: Secondary | ICD-10-CM | POA: Diagnosis not present

## 2016-05-02 DIAGNOSIS — J479 Bronchiectasis, uncomplicated: Secondary | ICD-10-CM | POA: Diagnosis not present

## 2016-05-02 DIAGNOSIS — M25562 Pain in left knee: Secondary | ICD-10-CM | POA: Diagnosis not present

## 2016-05-06 DIAGNOSIS — F324 Major depressive disorder, single episode, in partial remission: Secondary | ICD-10-CM | POA: Diagnosis not present

## 2016-05-06 DIAGNOSIS — J479 Bronchiectasis, uncomplicated: Secondary | ICD-10-CM | POA: Diagnosis not present

## 2016-05-06 DIAGNOSIS — M25561 Pain in right knee: Secondary | ICD-10-CM | POA: Diagnosis not present

## 2016-05-06 DIAGNOSIS — I10 Essential (primary) hypertension: Secondary | ICD-10-CM | POA: Diagnosis not present

## 2016-05-06 DIAGNOSIS — R55 Syncope and collapse: Secondary | ICD-10-CM | POA: Diagnosis not present

## 2016-05-06 DIAGNOSIS — M25562 Pain in left knee: Secondary | ICD-10-CM | POA: Diagnosis not present

## 2016-05-07 ENCOUNTER — Ambulatory Visit: Payer: Self-pay | Admitting: Emergency Medicine

## 2016-05-13 DIAGNOSIS — J479 Bronchiectasis, uncomplicated: Secondary | ICD-10-CM | POA: Diagnosis not present

## 2016-05-13 DIAGNOSIS — I10 Essential (primary) hypertension: Secondary | ICD-10-CM | POA: Diagnosis not present

## 2016-05-13 DIAGNOSIS — M25561 Pain in right knee: Secondary | ICD-10-CM | POA: Diagnosis not present

## 2016-05-13 DIAGNOSIS — M25562 Pain in left knee: Secondary | ICD-10-CM | POA: Diagnosis not present

## 2016-05-13 DIAGNOSIS — F324 Major depressive disorder, single episode, in partial remission: Secondary | ICD-10-CM | POA: Diagnosis not present

## 2016-05-13 DIAGNOSIS — R55 Syncope and collapse: Secondary | ICD-10-CM | POA: Diagnosis not present

## 2016-05-15 DIAGNOSIS — G459 Transient cerebral ischemic attack, unspecified: Secondary | ICD-10-CM | POA: Diagnosis not present

## 2016-05-23 DIAGNOSIS — M25562 Pain in left knee: Secondary | ICD-10-CM | POA: Diagnosis not present

## 2016-05-23 DIAGNOSIS — M25561 Pain in right knee: Secondary | ICD-10-CM | POA: Diagnosis not present

## 2016-05-23 DIAGNOSIS — R55 Syncope and collapse: Secondary | ICD-10-CM | POA: Diagnosis not present

## 2016-05-23 DIAGNOSIS — J479 Bronchiectasis, uncomplicated: Secondary | ICD-10-CM | POA: Diagnosis not present

## 2016-05-23 DIAGNOSIS — F324 Major depressive disorder, single episode, in partial remission: Secondary | ICD-10-CM | POA: Diagnosis not present

## 2016-05-23 DIAGNOSIS — I10 Essential (primary) hypertension: Secondary | ICD-10-CM | POA: Diagnosis not present

## 2016-06-04 ENCOUNTER — Ambulatory Visit: Payer: Medicare Other | Admitting: Psychiatry

## 2016-06-11 DIAGNOSIS — H353212 Exudative age-related macular degeneration, right eye, with inactive choroidal neovascularization: Secondary | ICD-10-CM | POA: Diagnosis not present

## 2016-06-11 DIAGNOSIS — H353122 Nonexudative age-related macular degeneration, left eye, intermediate dry stage: Secondary | ICD-10-CM | POA: Diagnosis not present

## 2016-07-01 DIAGNOSIS — M6281 Muscle weakness (generalized): Secondary | ICD-10-CM | POA: Diagnosis not present

## 2016-07-01 DIAGNOSIS — R293 Abnormal posture: Secondary | ICD-10-CM | POA: Diagnosis not present

## 2016-07-01 DIAGNOSIS — R2681 Unsteadiness on feet: Secondary | ICD-10-CM | POA: Diagnosis not present

## 2016-07-01 DIAGNOSIS — Z9181 History of falling: Secondary | ICD-10-CM | POA: Diagnosis not present

## 2016-07-03 DIAGNOSIS — Z9181 History of falling: Secondary | ICD-10-CM | POA: Diagnosis not present

## 2016-07-03 DIAGNOSIS — R293 Abnormal posture: Secondary | ICD-10-CM | POA: Diagnosis not present

## 2016-07-03 DIAGNOSIS — M6281 Muscle weakness (generalized): Secondary | ICD-10-CM | POA: Diagnosis not present

## 2016-07-03 DIAGNOSIS — R2681 Unsteadiness on feet: Secondary | ICD-10-CM | POA: Diagnosis not present

## 2016-07-05 DIAGNOSIS — R293 Abnormal posture: Secondary | ICD-10-CM | POA: Diagnosis not present

## 2016-07-05 DIAGNOSIS — Z9181 History of falling: Secondary | ICD-10-CM | POA: Diagnosis not present

## 2016-07-05 DIAGNOSIS — R2681 Unsteadiness on feet: Secondary | ICD-10-CM | POA: Diagnosis not present

## 2016-07-05 DIAGNOSIS — M6281 Muscle weakness (generalized): Secondary | ICD-10-CM | POA: Diagnosis not present

## 2016-07-10 DIAGNOSIS — R293 Abnormal posture: Secondary | ICD-10-CM | POA: Diagnosis not present

## 2016-07-10 DIAGNOSIS — R2681 Unsteadiness on feet: Secondary | ICD-10-CM | POA: Diagnosis not present

## 2016-07-10 DIAGNOSIS — Z9181 History of falling: Secondary | ICD-10-CM | POA: Diagnosis not present

## 2016-07-10 DIAGNOSIS — M6281 Muscle weakness (generalized): Secondary | ICD-10-CM | POA: Diagnosis not present

## 2016-07-11 DIAGNOSIS — Z9181 History of falling: Secondary | ICD-10-CM | POA: Diagnosis not present

## 2016-07-11 DIAGNOSIS — R2681 Unsteadiness on feet: Secondary | ICD-10-CM | POA: Diagnosis not present

## 2016-07-11 DIAGNOSIS — R293 Abnormal posture: Secondary | ICD-10-CM | POA: Diagnosis not present

## 2016-07-11 DIAGNOSIS — M6281 Muscle weakness (generalized): Secondary | ICD-10-CM | POA: Diagnosis not present

## 2016-07-12 DIAGNOSIS — Z9181 History of falling: Secondary | ICD-10-CM | POA: Diagnosis not present

## 2016-07-12 DIAGNOSIS — R2681 Unsteadiness on feet: Secondary | ICD-10-CM | POA: Diagnosis not present

## 2016-07-12 DIAGNOSIS — M6281 Muscle weakness (generalized): Secondary | ICD-10-CM | POA: Diagnosis not present

## 2016-07-12 DIAGNOSIS — R293 Abnormal posture: Secondary | ICD-10-CM | POA: Diagnosis not present

## 2016-07-15 DIAGNOSIS — M6281 Muscle weakness (generalized): Secondary | ICD-10-CM | POA: Diagnosis not present

## 2016-07-15 DIAGNOSIS — R2681 Unsteadiness on feet: Secondary | ICD-10-CM | POA: Diagnosis not present

## 2016-07-15 DIAGNOSIS — R293 Abnormal posture: Secondary | ICD-10-CM | POA: Diagnosis not present

## 2016-07-15 DIAGNOSIS — Z9181 History of falling: Secondary | ICD-10-CM | POA: Diagnosis not present

## 2016-07-18 DIAGNOSIS — R2681 Unsteadiness on feet: Secondary | ICD-10-CM | POA: Diagnosis not present

## 2016-07-18 DIAGNOSIS — R293 Abnormal posture: Secondary | ICD-10-CM | POA: Diagnosis not present

## 2016-07-18 DIAGNOSIS — Z9181 History of falling: Secondary | ICD-10-CM | POA: Diagnosis not present

## 2016-07-18 DIAGNOSIS — M6281 Muscle weakness (generalized): Secondary | ICD-10-CM | POA: Diagnosis not present

## 2016-07-19 ENCOUNTER — Telehealth: Payer: Self-pay | Admitting: Emergency Medicine

## 2016-07-19 DIAGNOSIS — Z9181 History of falling: Secondary | ICD-10-CM | POA: Diagnosis not present

## 2016-07-19 DIAGNOSIS — R2681 Unsteadiness on feet: Secondary | ICD-10-CM | POA: Diagnosis not present

## 2016-07-19 DIAGNOSIS — M6281 Muscle weakness (generalized): Secondary | ICD-10-CM | POA: Diagnosis not present

## 2016-07-19 DIAGNOSIS — R293 Abnormal posture: Secondary | ICD-10-CM | POA: Diagnosis not present

## 2016-07-19 MED ORDER — CEFDINIR 300 MG PO CAPS
300.0000 mg | ORAL_CAPSULE | Freq: Two times a day (BID) | ORAL | 0 refills | Status: DC
Start: 2016-07-19 — End: 2016-10-29

## 2016-07-19 NOTE — Telephone Encounter (Signed)
Left a message for Sarah [name] to call back.

## 2016-07-19 NOTE — Telephone Encounter (Signed)
Spoke with pt's daughter, states pt c/o increased prod cough with dark brown/green mucus, low grade temp, chills X1 day.  Pt is in assisted living, nurse at assisted living told nurse that she sounded like she might have bronchitis.   Denies chest pain, sinus congestion.   Pt has taken otc cough syrup and maintenance meds to help with s/s, requesting further recs.    [facility] [address] [facility] [address]  Sending to DOD as RB is unavailable.  MW please advise on recs.  Thanks.   04/26/16 AVS with RB:   Instructions  Please continue to use your xopenex nebulizer as needed for coughing or wheezing.  We will continue to rotate your antibiotics at the beginning of each month: doxycycline and azithromycin.  Continue your prednisone 5mg  daily.  Follow with Dr Delton CoombesByrum in 3 months or sooner if you have any problems.

## 2016-07-19 NOTE — Telephone Encounter (Signed)
Johnny BridgeMartha (patient's daughter) is returning phone call.

## 2016-07-19 NOTE — Telephone Encounter (Signed)
Spoke with pt's daughter, aware of recs.  rx sent to preferred pharmacy.  Nothing further needed.  

## 2016-07-19 NOTE — Telephone Encounter (Signed)
It looks like she's either just finishing doxy or zpak and allergic to pcn = diarrhea  So rec omnicef 300 mg bid x 10days

## 2016-07-22 DIAGNOSIS — R293 Abnormal posture: Secondary | ICD-10-CM | POA: Diagnosis not present

## 2016-07-22 DIAGNOSIS — Z9181 History of falling: Secondary | ICD-10-CM | POA: Diagnosis not present

## 2016-07-22 DIAGNOSIS — R2681 Unsteadiness on feet: Secondary | ICD-10-CM | POA: Diagnosis not present

## 2016-07-22 DIAGNOSIS — M6281 Muscle weakness (generalized): Secondary | ICD-10-CM | POA: Diagnosis not present

## 2016-07-24 ENCOUNTER — Telehealth: Payer: Self-pay | Admitting: Cardiovascular Disease

## 2016-07-24 DIAGNOSIS — Z9181 History of falling: Secondary | ICD-10-CM | POA: Diagnosis not present

## 2016-07-24 DIAGNOSIS — R2681 Unsteadiness on feet: Secondary | ICD-10-CM | POA: Diagnosis not present

## 2016-07-24 DIAGNOSIS — R293 Abnormal posture: Secondary | ICD-10-CM | POA: Diagnosis not present

## 2016-07-24 DIAGNOSIS — M6281 Muscle weakness (generalized): Secondary | ICD-10-CM | POA: Diagnosis not present

## 2016-07-24 NOTE — Telephone Encounter (Signed)
I spoke with patients daughter and she stated that the patients bottle still says take one tablet by mouth daily when it should be twice daily. They are not refilling this off of the new order and the patient is running out of medication before it is time for a refill. I have called the pharmacy back and they claim that they never received the new rx that was sent to them in March so I have given a verbal order and asked them to discontinue the rx written for once daily.

## 2016-07-24 NOTE — Telephone Encounter (Signed)
New message     *STAT* If patient is at the pharmacy, call can be transferred to refill team.   1. Which medications need to be refilled? (please list name of each medication and dose if known) losartan (COZAAR) 50 MG tablet(Expired)  2. Which pharmacy/location (including street and city if local pharmacy) is medication to be sent to? [facility] [address]  3. Do they need a 30 day or 90 day supply? 90 day supply

## 2016-07-24 NOTE — Telephone Encounter (Signed)
[facility] as per epic patient should have refills on the losartan. Per pharmacist the patient just picked this refill on on 07/21/16.  Medication Detail    Disp Refills Start End   losartan (COZAAR) 50 MG tablet 180 tablet 3 04/05/2016 07/04/2016   Sig - Route: Take 1 tablet (50 mg total) by mouth 2 (two) times daily. - Oral   E-Prescribing Status: Receipt confirmed by pharmacy (04/05/2016 12:27 PM EDT)   Pharmacy   [id number] - [place], [place] - [address]

## 2016-07-25 DIAGNOSIS — R2681 Unsteadiness on feet: Secondary | ICD-10-CM | POA: Diagnosis not present

## 2016-07-25 DIAGNOSIS — R293 Abnormal posture: Secondary | ICD-10-CM | POA: Diagnosis not present

## 2016-07-25 DIAGNOSIS — Z9181 History of falling: Secondary | ICD-10-CM | POA: Diagnosis not present

## 2016-07-25 DIAGNOSIS — M6281 Muscle weakness (generalized): Secondary | ICD-10-CM | POA: Diagnosis not present

## 2016-07-26 ENCOUNTER — Ambulatory Visit (INDEPENDENT_AMBULATORY_CARE_PROVIDER_SITE_OTHER): Payer: Medicare Other | Admitting: Emergency Medicine

## 2016-07-26 ENCOUNTER — Encounter: Payer: Self-pay | Admitting: Emergency Medicine

## 2016-07-26 DIAGNOSIS — I2510 Atherosclerotic heart disease of native coronary artery without angina pectoris: Secondary | ICD-10-CM

## 2016-07-26 DIAGNOSIS — J479 Bronchiectasis, uncomplicated: Secondary | ICD-10-CM

## 2016-07-26 DIAGNOSIS — J4521 Mild intermittent asthma with (acute) exacerbation: Secondary | ICD-10-CM | POA: Diagnosis not present

## 2016-07-26 NOTE — Assessment & Plan Note (Signed)
Scheduled xopenex

## 2016-07-26 NOTE — Progress Notes (Signed)
   Subjective:    Patient ID: Sarah [name], female    DOB: December 03, 1921, 83 y.o.   MRN: [id number]  HPI 83 yo never smoker, hx remote Legionella PNA, diagnosed with suspected asthma over 10 years ago. PFT's 7/10 with mixed disease, mild AFL. Formerly treated with Advair and albuterol, stopped when she moved to [place] in 2008. Stayed on Singulair. At original consult we d/c'd ACE-I. Started on Symbicort.    ROV 03/05/16 -- Sarah [name] has a history of bronchiectasis and upper airway irritation with chronic cough, probably also true asthma. She's been managed on chronic prednisone, Protonix. She's had multiple episodes of acute bronchitic syndrome, most recently treated 02/22/16. She increased her prednisone to 10 mg daily for 1 week and took doxycycline. She is now back down to prednisone 5 mg daily.  She has been rx with serial abx (also for a cellulitis) since November.   ROV 04/26/16 -- follow up visit for bronchiectasis, chronic cough, UA irritation, some true asthma. She has been managed on chronic pred at 5mg  qd. She has reliable bouts of cough. Happen daily. She is on rotating doxycycline and azithromycin at the beginning of each month. The sputum is a bit lighter. She uses flutter valve but not reliable.   ROV 07/26/16 -- this is a follow-up visit for patient with a history of bronchiectasis, chronic cough, upper airway irritation with some superimposed bronchospasm and true asthma.We have managed her on chronic prednisone 5 mg daily as well as rotating antibiotics (doxycycline, azithromycin). She had an increase in her mucous production, clear to green mucous. She was treated with omnicef, has improved - less mucous, feels better. Did not require extra pred. She has xopenex to use prn, also uses xopenex nebs bid on a schedule.    Review of Systems As above       Objective:   Physical Exam  Vitals:   07/26/16 1542  BP: 120/70  Pulse: 81  SpO2: 91%  Weight: 147 lb 12.8 oz (67 kg)  Height: 5'  7" (1.702 m)    GEN: A/Ox3; pleasant , NAD, elderly and thin   HEENT:  Op clear,   NECK:  No stridor  RESP  Bi;lateral insp squeaks, rhonchi  CARD:  RRR, no m/r/g  , no peripheral edema, pulses intact, no cyanosis or clubbing.  Musco: Warm bil, no deformities or joint swelling noted.   Neuro: alert, no focal deficits noted.    Skin: Warm, no lesions or rashes      Assessment & Plan:  BRONCHIECTASIS With recent acute flare characterized by an increase in sputum volume, change in sputum color. She has [facility] well [facility]. Continue xopenex, rotating abx, pred 5mg . Add back flutter valve if she develops difficulty clearing secretions.   Please continue your xopenex twice a day and as needed for shortness of breath  Continue to rotate your azithromycin and doxycycline monthly as you have been doing.  Continue prednisone 5mg  daily Complete your omnicef  Follow with Dr Delton CoombesByrum in 3 months or sooner if you have any problems.  Asthma Scheduled xopenex  Levy Pupaobert Shizue Kaseman, MD, PhD 07/26/2016, 4:02 PM [facility] [phone] or if no answer [phone]

## 2016-07-26 NOTE — Assessment & Plan Note (Addendum)
With recent acute flare characterized by an increase in sputum volume, change in sputum color. [facility]. Continue xopenex, rotating abx, pred 5mg . Add back flutter valve if she develops difficulty clearing secretions.   Please continue your xopenex twice a day and as needed for shortness of breath  Continue to rotate your azithromycin and doxycycline monthly as you have been doing.  Continue prednisone 5mg  daily Complete your omnicef  Follow with Dr Delton CoombesByrum in 3 months or sooner if you have any problems.

## 2016-07-26 NOTE — Patient Instructions (Signed)
Please continue your xopenex twice a day and as needed for shortness of breath  Continue to rotate your azithromycin and doxycycline monthly as you have been doing.  Continue prednisone 5mg  daily Complete your omnicef  Follow with Dr Delton CoombesByrum in 3 months or sooner if you have any problems.

## 2016-07-31 DIAGNOSIS — R2681 Unsteadiness on feet: Secondary | ICD-10-CM | POA: Diagnosis not present

## 2016-07-31 DIAGNOSIS — M6281 Muscle weakness (generalized): Secondary | ICD-10-CM | POA: Diagnosis not present

## 2016-07-31 DIAGNOSIS — R293 Abnormal posture: Secondary | ICD-10-CM | POA: Diagnosis not present

## 2016-07-31 DIAGNOSIS — Z9181 History of falling: Secondary | ICD-10-CM | POA: Diagnosis not present

## 2016-08-01 DIAGNOSIS — M6281 Muscle weakness (generalized): Secondary | ICD-10-CM | POA: Diagnosis not present

## 2016-08-01 DIAGNOSIS — Z9181 History of falling: Secondary | ICD-10-CM | POA: Diagnosis not present

## 2016-08-01 DIAGNOSIS — R293 Abnormal posture: Secondary | ICD-10-CM | POA: Diagnosis not present

## 2016-08-01 DIAGNOSIS — R2681 Unsteadiness on feet: Secondary | ICD-10-CM | POA: Diagnosis not present

## 2016-08-08 DIAGNOSIS — Z9181 History of falling: Secondary | ICD-10-CM | POA: Diagnosis not present

## 2016-08-08 DIAGNOSIS — R293 Abnormal posture: Secondary | ICD-10-CM | POA: Diagnosis not present

## 2016-08-08 DIAGNOSIS — M6281 Muscle weakness (generalized): Secondary | ICD-10-CM | POA: Diagnosis not present

## 2016-08-08 DIAGNOSIS — R2681 Unsteadiness on feet: Secondary | ICD-10-CM | POA: Diagnosis not present

## 2016-08-12 DIAGNOSIS — R634 Abnormal weight loss: Secondary | ICD-10-CM | POA: Diagnosis not present

## 2016-08-12 DIAGNOSIS — R1310 Dysphagia, unspecified: Secondary | ICD-10-CM | POA: Diagnosis not present

## 2016-08-12 DIAGNOSIS — J029 Acute pharyngitis, unspecified: Secondary | ICD-10-CM | POA: Diagnosis not present

## 2016-08-14 DIAGNOSIS — M6281 Muscle weakness (generalized): Secondary | ICD-10-CM | POA: Diagnosis not present

## 2016-08-14 DIAGNOSIS — R2681 Unsteadiness on feet: Secondary | ICD-10-CM | POA: Diagnosis not present

## 2016-08-14 DIAGNOSIS — R293 Abnormal posture: Secondary | ICD-10-CM | POA: Diagnosis not present

## 2016-08-14 DIAGNOSIS — Z9181 History of falling: Secondary | ICD-10-CM | POA: Diagnosis not present

## 2016-08-16 DIAGNOSIS — M6281 Muscle weakness (generalized): Secondary | ICD-10-CM | POA: Diagnosis not present

## 2016-08-16 DIAGNOSIS — R2681 Unsteadiness on feet: Secondary | ICD-10-CM | POA: Diagnosis not present

## 2016-08-16 DIAGNOSIS — Z9181 History of falling: Secondary | ICD-10-CM | POA: Diagnosis not present

## 2016-08-16 DIAGNOSIS — R293 Abnormal posture: Secondary | ICD-10-CM | POA: Diagnosis not present

## 2016-08-19 DIAGNOSIS — R2681 Unsteadiness on feet: Secondary | ICD-10-CM | POA: Diagnosis not present

## 2016-08-19 DIAGNOSIS — R293 Abnormal posture: Secondary | ICD-10-CM | POA: Diagnosis not present

## 2016-08-19 DIAGNOSIS — M6281 Muscle weakness (generalized): Secondary | ICD-10-CM | POA: Diagnosis not present

## 2016-08-19 DIAGNOSIS — Z9181 History of falling: Secondary | ICD-10-CM | POA: Diagnosis not present

## 2016-08-20 DIAGNOSIS — H353210 Exudative age-related macular degeneration, right eye, stage unspecified: Secondary | ICD-10-CM | POA: Diagnosis not present

## 2016-08-20 DIAGNOSIS — H353122 Nonexudative age-related macular degeneration, left eye, intermediate dry stage: Secondary | ICD-10-CM | POA: Diagnosis not present

## 2016-08-20 DIAGNOSIS — H401131 Primary open-angle glaucoma, bilateral, mild stage: Secondary | ICD-10-CM | POA: Diagnosis not present

## 2016-08-21 DIAGNOSIS — M6281 Muscle weakness (generalized): Secondary | ICD-10-CM | POA: Diagnosis not present

## 2016-08-21 DIAGNOSIS — Z9181 History of falling: Secondary | ICD-10-CM | POA: Diagnosis not present

## 2016-08-21 DIAGNOSIS — R293 Abnormal posture: Secondary | ICD-10-CM | POA: Diagnosis not present

## 2016-08-21 DIAGNOSIS — R2681 Unsteadiness on feet: Secondary | ICD-10-CM | POA: Diagnosis not present

## 2016-08-26 DIAGNOSIS — R2681 Unsteadiness on feet: Secondary | ICD-10-CM | POA: Diagnosis not present

## 2016-08-26 DIAGNOSIS — Z9181 History of falling: Secondary | ICD-10-CM | POA: Diagnosis not present

## 2016-08-26 DIAGNOSIS — M6281 Muscle weakness (generalized): Secondary | ICD-10-CM | POA: Diagnosis not present

## 2016-08-26 DIAGNOSIS — R293 Abnormal posture: Secondary | ICD-10-CM | POA: Diagnosis not present

## 2016-08-28 DIAGNOSIS — M6281 Muscle weakness (generalized): Secondary | ICD-10-CM | POA: Diagnosis not present

## 2016-08-28 DIAGNOSIS — R293 Abnormal posture: Secondary | ICD-10-CM | POA: Diagnosis not present

## 2016-08-28 DIAGNOSIS — R2681 Unsteadiness on feet: Secondary | ICD-10-CM | POA: Diagnosis not present

## 2016-08-28 DIAGNOSIS — Z9181 History of falling: Secondary | ICD-10-CM | POA: Diagnosis not present

## 2016-09-02 DIAGNOSIS — Z9181 History of falling: Secondary | ICD-10-CM | POA: Diagnosis not present

## 2016-09-02 DIAGNOSIS — R2681 Unsteadiness on feet: Secondary | ICD-10-CM | POA: Diagnosis not present

## 2016-09-02 DIAGNOSIS — M6281 Muscle weakness (generalized): Secondary | ICD-10-CM | POA: Diagnosis not present

## 2016-09-02 DIAGNOSIS — R293 Abnormal posture: Secondary | ICD-10-CM | POA: Diagnosis not present

## 2016-09-04 DIAGNOSIS — R293 Abnormal posture: Secondary | ICD-10-CM | POA: Diagnosis not present

## 2016-09-04 DIAGNOSIS — R2681 Unsteadiness on feet: Secondary | ICD-10-CM | POA: Diagnosis not present

## 2016-09-04 DIAGNOSIS — M6281 Muscle weakness (generalized): Secondary | ICD-10-CM | POA: Diagnosis not present

## 2016-09-04 DIAGNOSIS — Z9181 History of falling: Secondary | ICD-10-CM | POA: Diagnosis not present

## 2016-09-06 DIAGNOSIS — H903 Sensorineural hearing loss, bilateral: Secondary | ICD-10-CM | POA: Diagnosis not present

## 2016-09-08 DIAGNOSIS — R293 Abnormal posture: Secondary | ICD-10-CM | POA: Diagnosis not present

## 2016-09-08 DIAGNOSIS — Z9181 History of falling: Secondary | ICD-10-CM | POA: Diagnosis not present

## 2016-09-08 DIAGNOSIS — M6281 Muscle weakness (generalized): Secondary | ICD-10-CM | POA: Diagnosis not present

## 2016-09-08 DIAGNOSIS — R2681 Unsteadiness on feet: Secondary | ICD-10-CM | POA: Diagnosis not present

## 2016-09-17 DIAGNOSIS — H353212 Exudative age-related macular degeneration, right eye, with inactive choroidal neovascularization: Secondary | ICD-10-CM | POA: Diagnosis not present

## 2016-09-17 DIAGNOSIS — H353122 Nonexudative age-related macular degeneration, left eye, intermediate dry stage: Secondary | ICD-10-CM | POA: Diagnosis not present

## 2016-09-17 DIAGNOSIS — H43813 Vitreous degeneration, bilateral: Secondary | ICD-10-CM | POA: Diagnosis not present

## 2016-09-26 ENCOUNTER — Telehealth: Payer: Self-pay | Admitting: Cardiovascular Disease

## 2016-09-26 DIAGNOSIS — I10 Essential (primary) hypertension: Secondary | ICD-10-CM

## 2016-09-26 DIAGNOSIS — Z79899 Other long term (current) drug therapy: Secondary | ICD-10-CM

## 2016-09-26 MED ORDER — HYDROCHLOROTHIAZIDE 25 MG PO TABS: 25.0000 mg | ORAL_TABLET | Freq: Every day | ORAL | 3 refills | Status: DC

## 2016-09-26 MED ORDER — CARVEDILOL 6.25 MG PO TABS: 6.2500 mg | ORAL_TABLET | Freq: Two times a day (BID) | ORAL | 3 refills | Status: DC

## 2016-09-26 NOTE — Telephone Encounter (Signed)
Reviewed information with Dr Elease HashimotoNahser who gave orders to d/c metoprolol, increase HCTZ to 25 mg a day and start Carvedilol 6.25 mg po BID.  BMP in 3 weeks and f/u with him or APP soon.

## 2016-09-26 NOTE — Telephone Encounter (Signed)
Spoke with daughter who is aware to increase HCTZ to 25 mg, discontinue the Metoprolol and start Carvedilol 6.25 mg one BID.  Pt was scheduled for blood work 10/2 and f/u appt 10/4 with PA.  Daughter will continue to monitor BP/HR and c/b if no improvement or further issues.

## 2016-09-26 NOTE — Telephone Encounter (Signed)
Spoke with daughter (OK per DPR).  Per pt's report, BP has been elevated most every afternoon before dinner and she is having to take the extra dose of Metoprolol on a daily basis.  Daughter doesn't know what BPs and HRs are.  She doesn't know her medication either.  Daughter is going to where patient is, verify the information and call back with it.

## 2016-09-26 NOTE — Telephone Encounter (Signed)
New message    Pt c/o BP issue: STAT if pt c/o blurred vision, one-sided weakness or slurred speech  1. What are your last 5 BP readings? Pt daughter does not have them. She said 180-190   2. Are you having any other symptoms (ex. Dizziness, headache, blurred vision, passed out)? no  3. What is your BP issue? Pt daughter is calling stating that her BP has been spiking.

## 2016-09-26 NOTE — Telephone Encounter (Signed)
Daughter called back and patient is taking medications as listed in chart. Pt reports all through June and July her BP's have been fine.  Recently she feels like something is not right and she takes BP.  For the past 2 weeks it has been 181/125 in the afternoon (when she take the extra dose of metoprolol), 166/100 a few hrs later and then down to 129/88 before bedtime. Today at 12N it was 165/107 then spiked to 193/123 and at 3:45 pm was 161/106.  HR has been between 65 to 83 bpm.  She took xanax today when her BP was 193/123 because she was feeling very shaky and anxious.  She reports her BP is OK in the morning and has been coming down before going to bed.  She denies any pain or changes in her health other than her COPD giving her "a fit" recently.  She does use a breathing treatment twice a day but denies any connection between them and the elevated BPs.  Of note, she has recently changed the batteries in her machine.  Advised if BP does go back up to this level she should report to the ED for evaluation and treatment.  Aware I will forward this information to Dr Elease HashimotoNahser and call back with further instructions/orders.  Pt [facility] [place]

## 2016-10-07 ENCOUNTER — Telehealth: Payer: Self-pay | Admitting: Cardiovascular Disease

## 2016-10-07 NOTE — Telephone Encounter (Signed)
Spoke with patient's daughter, Johnny Bridge, who states patient is having elevated BP readings approximately 1 hour prior to taking 2nd dose of BP medications. I asked about the equipment that she is using because the readings that were given are unusual BP readings. She states the patient uses an upper arm cuff and is placing the monitor on the sofa beside her when she is checking her BP. I advised her to place the monitor on a flat surface at the level of her heart and asked her also to replace the batteries in the machine if that has not been done recently. She states the patient has not had any complaints about not feeling well but gets anxious when she sees high BP readings. I advised her to continue to monitor BP and to call back with questions or concerns. She asked if patient can take 2nd doses of medications earlier in the day if BP is elevated and I agreed. Johnny Bridge thanked me for the call.

## 2016-10-07 NOTE — Telephone Encounter (Signed)
New message   Pt daughter verbalized   Pt c/o BP issue: STAT if pt c/o blurred vision, one-sided weakness or slurred speech  1. What are your last 5 BP readings? 188/129 10-05-16       177/155  10-06-2016       166/78 after taking medication  2. Are you having any other symptoms (ex. Dizziness, headache, blurred vision, passed out)? no 3. What is your BP issue? high

## 2016-10-09 ENCOUNTER — Encounter: Payer: Self-pay | Admitting: Cardiology

## 2016-10-11 ENCOUNTER — Telehealth: Payer: Self-pay | Admitting: Emergency Medicine

## 2016-10-11 MED ORDER — PREDNISONE 10 MG PO TABS: ORAL_TABLET | ORAL | 0 refills | Status: DC

## 2016-10-11 MED ORDER — AZITHROMYCIN 250 MG PO TABS: ORAL_TABLET | ORAL | 0 refills | Status: DC

## 2016-10-11 NOTE — Telephone Encounter (Signed)
Spoke with pt's daughter and gave message from [place]. I sent in Rx's to [facility]. Johnny Bridge agreed and verbalized understanding. Nothing further is needed.

## 2016-10-11 NOTE — Telephone Encounter (Signed)
There aren't many other good choices for abx due to her allergy profile. Have her go ahead and take her next course of azithromycin now.   If she is willing, have her take pred taper as follows : Take  daily for 3 days, then  daily for 3 days, then  daily for 3 days, then back to her usual  daily

## 2016-10-11 NOTE — Telephone Encounter (Signed)
Called and spoke with pts daughter Sarah [name] and she stated that she is on her way to check on her mother now.  She stated that her mother is very weak and has a hard time walking across the room, she is still coughing up brown sputum, and wheezing worse than normal.   Sarah [name] stated that the pt is finishing up the last of her doxy that she has been on and has not seen any improvement from this.  RB please advise. Thanks   Allergies  Allergen Reactions  . Codeine Anaphylaxis  . Hydrocodone Nausea And Vomiting and Other (See Comments)    SYNCOPE AND BRADYCARDIA  . Isosorbide Nitrate Other (See Comments)    BRADYCARDIA  . Oxycodone Palpitations    Rapid heart beat  . Clarithromycin Nausea And Vomiting  . Fiorinal [Butalbital-Aspirin-Caffeine] Swelling and Other (See Comments)    Facial swelling   . Levofloxacin Nausea And Vomiting and Other (See Comments)    SYNCOPE ALSO  . Augmentin [Amoxicillin-Pot Clavulanate] Diarrhea

## 2016-10-15 ENCOUNTER — Other Ambulatory Visit: Payer: Medicare Other

## 2016-10-15 DIAGNOSIS — Z79899 Other long term (current) drug therapy: Secondary | ICD-10-CM | POA: Diagnosis not present

## 2016-10-15 DIAGNOSIS — I10 Essential (primary) hypertension: Secondary | ICD-10-CM

## 2016-10-16 ENCOUNTER — Telehealth: Payer: Self-pay | Admitting: Nurse Practitioner

## 2016-10-16 DIAGNOSIS — E871 Hypo-osmolality and hyponatremia: Secondary | ICD-10-CM

## 2016-10-16 LAB — BASIC METABOLIC PANEL
BUN/Creatinine Ratio: 17 (ref 12–28)
BUN: 17 mg/dL (ref 10–36)
CO2: 32 mmol/L — ABNORMAL HIGH (ref 20–29)
Calcium: 9.7 mg/dL (ref 8.7–10.3)
Chloride: 84 mmol/L — ABNORMAL LOW (ref 96–106)
Creatinine, Ser: 1 mg/dL (ref 0.57–1.00)
GFR, EST AFRICAN AMERICAN: 56 mL/min/{1.73_m2} — AB (ref >59)
GFR, EST NON AFRICAN AMERICAN: 49 mL/min/{1.73_m2} — AB (ref >59)
Glucose: 80 mg/dL (ref 65–99)
POTASSIUM: 4.6 mmol/L (ref 3.5–5.2)
SODIUM: 128 mmol/L — AB (ref 134–144)

## 2016-10-16 MED ORDER — HYDROCHLOROTHIAZIDE 25 MG PO TABS: 12.5000 mg | ORAL_TABLET | Freq: Every day | ORAL | 3 refills | Status: DC

## 2016-10-16 NOTE — Telephone Encounter (Signed)
-----   Message from Vesta Mixer, MD sent at 10/16/2016  9:23 AM EDT ----- She has developed hyponatremia. This is likely due to the HCTZ   Her prednisone therapy may also be contributing. Decrease the HCTZ to 12.5 mg a day  Recheck BMP in 1 month She should decrease the amount of water that she is drinking .

## 2016-10-16 NOTE — Telephone Encounter (Signed)
Spoke with patient's daughter Sharyl Nimrod to review results and plan of care. She states patient has appointment with Boyce Medici, PA tomorrow. She states patient has been encouraged to increase water intake to help keep her respiratory secretions thin and for kidney function. I advised her to decrease water intake slightly and to have an electrolyte drink daily. She states her mother likes Gatorade, so that will not be a problem. She states she was concerned that patient's K+ would be low due to the fact that she cannot swallow the K+ pill; I advised that K+ is normal. She requests lab appointment on a Tuesday and I scheduled her for 10/30. She verbalized understanding and agreement with plan and I advised that they call back with additional questions or concerns. She thanked me for the call.

## 2016-10-17 ENCOUNTER — Encounter: Payer: Self-pay | Admitting: Cardiology

## 2016-10-17 ENCOUNTER — Ambulatory Visit (INDEPENDENT_AMBULATORY_CARE_PROVIDER_SITE_OTHER): Payer: Medicare Other | Admitting: Cardiology

## 2016-10-17 VITALS — BP 120/74 | HR 76 | Resp 16 | Ht 67.0 in | Wt 144.0 lb

## 2016-10-17 DIAGNOSIS — I2510 Atherosclerotic heart disease of native coronary artery without angina pectoris: Secondary | ICD-10-CM

## 2016-10-17 DIAGNOSIS — I10 Essential (primary) hypertension: Secondary | ICD-10-CM | POA: Diagnosis not present

## 2016-10-17 NOTE — Patient Instructions (Addendum)
Medication Instructions:  Your physician recommends that you continue on your current medications as directed. Please refer to the Current Medication list given to you today.   Labwork: None ordered  Testing/Procedures: None ordered  Follow-Up: Your physician wants you to follow-up in: 6 MONTHS WITH DR. NAHSER  You will receive a reminder letter in the mail two months in advance. If you don't receive a letter, please call our office to schedule the follow-up appointment.   Any Other Special Instructions Will Be Listed Below (If Applicable).     If you need a refill on your cardiac medications before your next appointment, please call your pharmacy.   

## 2016-10-17 NOTE — Progress Notes (Signed)
10/17/2016 Sarah [name]   05-20-1921  [id number]  Primary Physician [name], MD Primary Cardiologist: Dr. Cathie Olden    Reason for Visit/CC: HTN and hyponatremia  HPI:  Sarah [name] is a 83 y.o. female , followed by Dr. Cathie Olden, with h/o HTN, CAD with remote cath showing nonobstructive CAD, ? H/o TIA, and bronchiectasis from Legionnaires  disease in the 1970s (followed by pulmonology) who presents to clinic today for HTN and medication management in the setting of recent hyponatremia.   She has has some recently increases in her BP despite increasing doses of her metoprolol. She was given instruction by our office on 09/26/16 to stop metoprolol and start coreg 6.25 mg BID and to increase HCTZ to 25 mg daily, with plans to f/u with APP in 3 weeks to assess response to medication change. She had f/u labs on 10/15/16 after medication change which showed hyponatremia, with drop in Na+ from a baseline of 137-140 down to 128. Dr.Nahser was notified. He felt this was likely due to recent increase of her HCTZ as well as prednisone. He advised that she reduce dose of HCTZ back down to 12.5 mg daily and to recheck a BMP in 1 month.   She is here today with her daughter. She reports that she feels well. Asymptomatic from her hyponatremia. Her BP is well controlled at 120/74 and she reports it has been well controlled at home. No side effects or intolerances. HR is stable with BB, in the 70s.     Current Meds  Medication Sig  . ALPRAZolam (XANAX) 0.5 MG tablet Take 1 tablet (0.5 mg total) by mouth 2 (two) times daily.  Marland Kitchen atorvastatin (LIPITOR) 10 MG tablet Take 1 tablet (10 mg total) by mouth daily at 6 PM.  . azithromycin (ZITHROMAX) 250 MG tablet Take 2 pills today then one a day for 4 additional days on the 20th of June, August, and October  . calcium carbonate (TUMS - DOSED IN MG ELEMENTAL CALCIUM) 500 MG chewable tablet Chew 1 tablet by mouth every 8 (eight) hours as needed for indigestion or  heartburn.  . carvedilol (COREG) 6.25 MG tablet Take 1 tablet (6.25 mg total) by mouth 2 (two) times daily.  . cefdinir (OMNICEF) 300 MG capsule Take 1 capsule (300 mg total) by mouth 2 (two) times daily.  . clonazePAM (KLONOPIN) 0.5 MG tablet Take 0.25 mg by mouth at bedtime.   . clopidogrel (PLAVIX) 75 MG tablet Take 1 tablet (75 mg total) by mouth daily.  Marland Kitchen doxycycline (VIBRA-TABS) 100 MG tablet Take 1 tablet (100 mg total) by mouth 2 (two) times daily. Take this the 20th of May, July, Sept  . DULoxetine (CYMBALTA) 20 MG capsule Take 20 mg by mouth 3 (three) times daily.  Marland Kitchen escitalopram (LEXAPRO) 5 MG tablet Take 2.5 mg by mouth daily.  Marland Kitchen gabapentin (NEURONTIN) 100 MG capsule Take 100 mg by mouth daily.  . hydrochlorothiazide (HYDRODIURIL) 25 MG tablet Take 0.5 tablets (12.5 mg total) by mouth daily.  Marland Kitchen latanoprost (XALATAN) 0.005 % ophthalmic solution Place 1 drop into both eyes at bedtime.   . levalbuterol (XOPENEX HFA) 45 MCG/ACT inhaler Inhale 1-2 puffs into the lungs every 6 (six) hours as needed for wheezing.  . levalbuterol (XOPENEX) 1.25 MG/3ML nebulizer solution Take 1.25 mg by nebulization 2 (two) times daily. At 1pm and 7pm  . levothyroxine (SYNTHROID, LEVOTHROID) 50 MCG tablet Take 50 mcg by mouth daily.    Marland Kitchen loratadine (CLARITIN) 10 MG tablet Take 10  mg by mouth daily.  Marland Kitchen losartan (COZAAR) 50 MG tablet Take 50 mg by mouth 2 (two) times daily.  . pantoprazole (PROTONIX) 40 MG tablet Take 1 capsule 1 hour before your first meal of the day  . predniSONE (DELTASONE) 10 MG tablet Take 3  for 3 days, 2 for 3 days, 1 for 3 days and stop  . predniSONE (DELTASONE) 5 MG tablet TAKE 1 TABLET BY MOUTH ONCE DAILY WITH BREAKFAST.  Marland Kitchen Sennosides (SENOKOT PO) Take 1 tablet by mouth every other day.  Marland Kitchen VITAMIN D, CHOLECALCIFEROL, PO Take by mouth.   Allergies  Allergen Reactions  . Codeine Anaphylaxis  . Hydrocodone Nausea And Vomiting and Other (See Comments)    SYNCOPE AND BRADYCARDIA  .  Isosorbide Nitrate Other (See Comments)    BRADYCARDIA  . Oxycodone Palpitations    Rapid heart beat  . Clarithromycin Nausea And Vomiting  . Fiorinal [Butalbital-Aspirin-Caffeine] Swelling and Other (See Comments)    Facial swelling   . Levofloxacin Nausea And Vomiting and Other (See Comments)    SYNCOPE ALSO  . Augmentin [Amoxicillin-Pot Clavulanate] Diarrhea   Past Medical History:  Diagnosis Date  . Allergic rhinitis   . Anxiety    Anxiety attacks  . Arthritis    "a little; in my back and hands" (04/15/2012)  . Asthma   . Bronchiectasis    with history of Legionaires with chronic rales/rhonchi  . Chest pain at rest   . Daily headache    "over the last week" 04/15/2012   . Depression   . Diastolic dysfunction    a. per echo 08/2010 with normal LV function;  b. 06/2011 Echo: EF 65-70%  . GERD (gastroesophageal reflux disease)    "just recently" (04/15/2012)  . H/O hiatal hernia   . H/O Legionnaire's disease 11/1976  . History of blood transfusion 1972   "w/hysterectomy" (04/15/2012)  . Hypercholesteremia   . Hypertension    Negative renal duplex in December of 2012  . Hypothyroidism   . Idiopathic peripheral neuropathy   . MAC (mycobacterium avium-intracellulare complex)   . Migraines    "outgrew them" (04/15/2012)  . NSTEMI (non-ST elevated myocardial infarction) ([place])    a. Normal coronaries per cath August 2012 and negative CT angio for PE;  b. 06/2011 Repeat admission w/ chest pain and elevated troponin's, CTA Chest w/o PE  . Osteopenia 02/2011   t score - 2.1  . Pneumonia    "recurrent" (04/15/2012)  . Pollen allergies   . Shortness of breath    "sometimes just lying down" (04/15/2012)  . Stroke [facility]) 06/2014   cerebellum   Family History  Problem Relation Age of Onset  . Hypertension Mother        stroke  . Stroke Mother   . Cancer Brother        prostate  . Osteoarthritis Paternal Aunt    Past Surgical History:  Procedure Laterality Date  . APPENDECTOMY    .  CARDIAC CATHETERIZATION  August 2012   Normal coronaries.  Marland Kitchen DILATION AND CURETTAGE OF UTERUS    . TONSILLECTOMY  1930  . TOTAL ABDOMINAL HYSTERECTOMY W/ BILATERAL SALPINGOOPHORECTOMY  1972   leiomyomata, menorrhagia   Social History   Social History  . Marital status: Married    Spouse name: N/A  . Number of children: N/A  . Years of education: N/A   Occupational History  . retired Education officer, [employer]    Social History Main Topics  . Smoking status: Never Smoker  .  Smokeless tobacco: Never Used  . Alcohol use 1.2 oz/week    2 Glasses of wine per week     Comment: wine 1-2 week  . Drug use: No  . Sexual activity: No   Other Topics Concern  . Not on file   Social History Narrative   Lives in an assisted living     Review of Systems: General: negative for chills, fever, night sweats or weight changes.  Cardiovascular: negative for chest pain, dyspnea on exertion, edema, orthopnea, palpitations, paroxysmal nocturnal dyspnea or shortness of breath Dermatological: negative for rash Respiratory: negative for cough or wheezing Urologic: negative for hematuria Abdominal: negative for nausea, vomiting, diarrhea, bright red blood per rectum, melena, or hematemesis Neurologic: negative for visual changes, syncope, or dizziness All other systems reviewed and are otherwise negative except as noted above.   Physical Exam:  Blood pressure 120/74, pulse 76, resp. rate 16, height _0  (1.702 m), weight 144 lb (65.3 kg), last menstrual period 01/14/1970, SpO2 96 %.  General appearance: alert, cooperative and no distress Neck: no carotid bruit and no JVD Lungs: bilateral crackles chronic from pulmonary disease Heart: regular rate and rhythm, S1, S2 normal, no murmur, click, rub or gallop Extremities: extremities normal, atraumatic, no cyanosis or edema Pulses: 2+ and symmetric Skin: Skin color, texture, turgor normal. No rashes or lesions Neurologic: Grossly normal  EKG not performed  -- personally reviewed   ASSESSMENT AND PLAN:   1. HTN: stable on current regimen. She is tolerating addition of Coreg w/o side effects. HR stable. BPs at home have been stable. Continue current regimen.   2. Hyponatremia: Drop in Na to 128 after increase in HCTZ to 25 mg. This has been reduced back down to 12.5 mg per Dr. Elmarie Shiley recs. Pt is asymptomatic. Plan is to recheck BMP on 11/12/16.   3. H/o CAD: noted in previous notes that pt has nonobstructive CAD no LCH in the past. Stable w/o CP.   4. Bronchiectasis: followed by [name]. On Prednisone.   Follow-Up w/ Dr. Cathie Olden in 6 months   Sebastin Perlmutter Ladoris Gene, MHS [facility] 10/17/2016 10:48 AM

## 2016-10-29 ENCOUNTER — Encounter: Payer: Self-pay | Admitting: Emergency Medicine

## 2016-10-29 ENCOUNTER — Ambulatory Visit (INDEPENDENT_AMBULATORY_CARE_PROVIDER_SITE_OTHER): Payer: Medicare Other | Admitting: Emergency Medicine

## 2016-10-29 DIAGNOSIS — Z23 Encounter for immunization: Secondary | ICD-10-CM

## 2016-10-29 DIAGNOSIS — J479 Bronchiectasis, uncomplicated: Secondary | ICD-10-CM | POA: Diagnosis not present

## 2016-10-29 DIAGNOSIS — I2510 Atherosclerotic heart disease of native coronary artery without angina pectoris: Secondary | ICD-10-CM | POA: Diagnosis not present

## 2016-10-29 MED ORDER — PREDNISONE 10 MG PO TABS: 10.0000 mg | ORAL_TABLET | Freq: Every day | ORAL | 5 refills | Status: DC

## 2016-10-29 MED ORDER — CEFDINIR 300 MG PO CAPS: 300.0000 mg | ORAL_CAPSULE | Freq: Two times a day (BID) | ORAL | 3 refills | Status: DC

## 2016-10-29 MED ORDER — AZITHROMYCIN 250 MG PO TABS: ORAL_TABLET | ORAL | 2 refills | Status: DC

## 2016-10-29 MED ORDER — DOXYCYCLINE HYCLATE 100 MG PO TABS: 100.0000 mg | ORAL_TABLET | Freq: Two times a day (BID) | ORAL | 2 refills | Status: DC

## 2016-10-29 NOTE — Progress Notes (Signed)
Subjective:    Patient ID: Sarah [name], female    DOB: May 14, 1921, 83 y.o.   MRN: [id number]  HPI 83 yo never smoker, hx remote Legionella PNA, diagnosed with suspected asthma over 10 years ago. PFT's 7/10 with mixed disease, mild AFL. Formerly treated with Advair and albuterol, stopped when she moved to [place] in 2008. Stayed on Singulair. At original consult we d/c'd ACE-I. Started on Symbicort.    ROV 03/05/16 -- Mrs. Gomer has a history of bronchiectasis and upper airway irritation with chronic cough, probably also true asthma. She's been managed on chronic prednisone, Protonix. She's had multiple episodes of acute bronchitic syndrome, most recently treated 02/22/16. She increased her prednisone to 10 mg daily for 1 week and took doxycycline. She is now back down to prednisone 5 mg daily.  She has been rx with serial abx (also for a cellulitis) since November.   ROV 04/26/16 -- follow up visit for bronchiectasis, chronic cough, UA irritation, some true asthma. She has been managed on chronic pred at  qd. She has reliable bouts of cough. Happen daily. She is on rotating doxycycline and azithromycin at the beginning of each month. The sputum is a bit lighter. She uses flutter valve but not reliable.   ROV 07/26/16 -- this is a follow-up visit for patient with a history of bronchiectasis, chronic cough, upper airway irritation with some superimposed bronchospasm and true asthma.We have managed her on chronic prednisone 5 mg daily as well as rotating antibiotics (doxycycline, azithromycin). She had an increase in her mucous production, clear to green mucous. She was treated with omnicef, has improved - less mucous, feels better. Did not require extra pred. She has xopenex to use prn, also uses xopenex nebs bid on a schedule.   ROV 10/29/16 -- Mrs. Cragg is 83 and has a history of bronchiectasis, asthma, chronic cough and upper airway irritation. She is currently on chronic prednisone 5 mg daily,  rotating antibiotics (doxycycline, azithromycin). She was having more difficulty in late September and I asked her to take her azithromycin early + a pred taper, which she tolerated and seemed to benefit from. She gets more benefit, better control of her mucous with the azithro. Her sputum and cough increased over the summer - increased maintenance. She is using flutter. Uses xopenex bid.    Review of Systems As above       Objective:   Physical Exam  Vitals:   10/29/16 1156  BP: 114/70  Pulse: 80  SpO2: 93%  Weight: 143 lb (64.9 kg)  Height: 5' 7.5" (1.715 m)    GEN: A/Ox3; pleasant , NAD, elderly and thin   HEENT:  Op clear,   NECK:  No stridor  RESP  Bi;lateral insp squeaks, rhonchi  CARD:  RRR, no m/r/g  , no peripheral edema, pulses intact, no cyanosis or clubbing.  Musco: Warm bil, no deformities or joint swelling noted.   Neuro: alert, no focal deficits noted.    Skin: Warm, no lesions or rashes      Assessment & Plan:  BRONCHIECTASIS She had a very difficult summer but after a prednisone taper recently and more frequent use of her chronic rotating antibiotics she appears to be back to her usual baseline.  We will write prescriptions so that you can continue to rotate your antibiotics monthly.: - cefdinir for 7 days - doxycycline for 10 days - azithromycin for 5 days.  We will increase prednisone to 10 mg daily Continue your other medications  as you are taking them including her Xopenex Flu shot today. Follow with Dr Delton Coombes in 3 months or sooner if you have any problems  Levy Pupa, MD, PhD 10/29/2016, 12:27 PM [facility] [phone] or if no answer [phone]

## 2016-10-29 NOTE — Assessment & Plan Note (Signed)
She had a very difficult summer but after a prednisone taper recently and more frequent use of her chronic rotating antibiotics she appears to be back to her usual baseline.  We will write prescriptions so that you can continue to rotate your antibiotics monthly.: - cefdinir for 7 days - doxycycline for 10 days - azithromycin for 5 days.  We will increase prednisone to 10 mg daily Continue your other medications as you are taking them including her Xopenex Flu shot today. Follow with Dr Delton Coombes in 3 months or sooner if you have any problems

## 2016-10-29 NOTE — Patient Instructions (Signed)
We will write prescriptions so that you can continue to rotate your antibiotics monthly.: - cefdinir for 7 days - doxycycline for 10 days - azithromycin for 5 days.  We will increase prednisone to 10 mg daily Continue your other medications as you are taking them including her Xopenex Flu shot today. Follow with Dr Delton Coombes in 3 months or sooner if you have any problems.

## 2016-11-05 DIAGNOSIS — R5383 Other fatigue: Secondary | ICD-10-CM | POA: Diagnosis not present

## 2016-11-05 DIAGNOSIS — F411 Generalized anxiety disorder: Secondary | ICD-10-CM | POA: Diagnosis not present

## 2016-11-05 DIAGNOSIS — J471 Bronchiectasis with (acute) exacerbation: Secondary | ICD-10-CM | POA: Diagnosis not present

## 2016-11-12 ENCOUNTER — Other Ambulatory Visit: Payer: Medicare Other | Admitting: *Deleted

## 2016-11-12 DIAGNOSIS — E871 Hypo-osmolality and hyponatremia: Secondary | ICD-10-CM | POA: Diagnosis not present

## 2016-11-13 LAB — BASIC METABOLIC PANEL
BUN / CREAT RATIO: 13 (ref 12–28)
BUN: 12 mg/dL (ref 10–36)
CALCIUM: 9.5 mg/dL (ref 8.7–10.3)
CO2: 24 mmol/L (ref 20–29)
CREATININE: 0.96 mg/dL (ref 0.57–1.00)
Chloride: 95 mmol/L — ABNORMAL LOW (ref 96–106)
GFR, EST AFRICAN AMERICAN: 59 mL/min/{1.73_m2} — AB (ref >59)
GFR, EST NON AFRICAN AMERICAN: 51 mL/min/{1.73_m2} — AB (ref >59)
Glucose: 112 mg/dL — ABNORMAL HIGH (ref 65–99)
Potassium: 4.9 mmol/L (ref 3.5–5.2)
Sodium: 135 mmol/L (ref 134–144)

## 2016-11-19 ENCOUNTER — Telehealth: Payer: Self-pay | Admitting: Emergency Medicine

## 2016-11-19 ENCOUNTER — Other Ambulatory Visit: Payer: Medicare Other

## 2016-11-19 DIAGNOSIS — J479 Bronchiectasis, uncomplicated: Secondary | ICD-10-CM

## 2016-11-19 NOTE — Telephone Encounter (Signed)
Felicia in the lab calling to see if Dr. Delton CoombesByrum has responded regarding sputum culture.  She is needing us to call her and advise if he does not respond today, do they need to discard the sputum culture.  Felicia's ext is [phone].

## 2016-11-19 NOTE — Telephone Encounter (Signed)
"  We will write prescriptions so that you can continue to rotate your antibiotics monthly.: - cefdinir for 7 days - doxycycline for 10 days - azithromycin for 5 days.  We will increase prednisone to 10 mg daily Continue your other medications as you are taking them including her Xopenex Flu shot today. Follow with Dr Delton CoombesByrum in 3 months or sooner if you have any problems."  Patient was last seen on 10/29/16 by RB.   RB, please advise if you wanted a sputum culture to be ordered on patient. Thanks!

## 2016-11-20 NOTE — Telephone Encounter (Signed)
flecia in lab calling back a/b sputum pt brought in on yeaterday  please advise if they don't hear antthing today sample will have to be disguarded only go for a certain amount of time//me

## 2016-11-20 NOTE — Telephone Encounter (Signed)
Orders have been placed. Lab is aware. Nothing else needed at time of call.

## 2016-11-20 NOTE — Telephone Encounter (Signed)
Please have them send it for AFB and for bacterial culture

## 2016-11-22 ENCOUNTER — Other Ambulatory Visit: Payer: Medicare Other

## 2016-11-22 DIAGNOSIS — J479 Bronchiectasis, uncomplicated: Secondary | ICD-10-CM

## 2016-11-27 ENCOUNTER — Telehealth: Payer: Self-pay | Admitting: Emergency Medicine

## 2016-11-27 NOTE — Telephone Encounter (Signed)
RB please advise if pt can start doxycycline now instead of waiting until the 20th of the month. Thanks

## 2016-11-28 NOTE — Telephone Encounter (Signed)
Spoke with patient's daughter. She is aware. She stated that the patient has the doxy on hand at the pharmacy.   Nothing else needed at time of call.

## 2016-11-28 NOTE — Telephone Encounter (Signed)
Yes ok to go ahead and start the doxy

## 2016-12-10 ENCOUNTER — Other Ambulatory Visit: Payer: Self-pay

## 2016-12-10 ENCOUNTER — Ambulatory Visit (INDEPENDENT_AMBULATORY_CARE_PROVIDER_SITE_OTHER): Payer: Medicare Other | Admitting: Emergency Medicine

## 2016-12-10 ENCOUNTER — Ambulatory Visit (INDEPENDENT_AMBULATORY_CARE_PROVIDER_SITE_OTHER)
Admission: RE | Admit: 2016-12-10 | Discharge: 2016-12-10 | Disposition: A | Discharge status: Home / Self Care | Payer: Medicare Other | Source: Ambulatory Visit | Attending: Emergency Medicine | Admitting: Emergency Medicine

## 2016-12-10 ENCOUNTER — Encounter: Payer: Self-pay | Admitting: Emergency Medicine

## 2016-12-10 ENCOUNTER — Other Ambulatory Visit: Payer: Medicare Other

## 2016-12-10 VITALS — BP 120/68 | HR 93 | Ht 67.0 in | Wt 144.4 lb

## 2016-12-10 DIAGNOSIS — J479 Bronchiectasis, uncomplicated: Secondary | ICD-10-CM | POA: Diagnosis not present

## 2016-12-10 DIAGNOSIS — I2510 Atherosclerotic heart disease of native coronary artery without angina pectoris: Secondary | ICD-10-CM

## 2016-12-10 DIAGNOSIS — R918 Other nonspecific abnormal finding of lung field: Secondary | ICD-10-CM | POA: Diagnosis not present

## 2016-12-10 MED ORDER — CEFDINIR 300 MG PO CAPS: 300.0000 mg | ORAL_CAPSULE | Freq: Two times a day (BID) | ORAL | 3 refills | Status: DC

## 2016-12-10 MED ORDER — SODIUM CHLORIDE 3 % IN NEBU: INHALATION_SOLUTION | Freq: Two times a day (BID) | RESPIRATORY_TRACT | 12 refills | Status: DC

## 2016-12-10 MED ORDER — SODIUM CHLORIDE 3 % IN NEBU: INHALATION_SOLUTION | RESPIRATORY_TRACT | 12 refills | Status: DC | PRN

## 2016-12-10 NOTE — Addendum Note (Signed)
Addended by: Etheleen MayhewOX, Shakirah Kirkey C on: 12/10/2016 11:10 AM   Modules accepted: Orders

## 2016-12-10 NOTE — Addendum Note (Signed)
Addended by: Etheleen MayhewOX, HEATHER C on: 12/10/2016 10:23 AM   Modules accepted: Orders

## 2016-12-10 NOTE — Progress Notes (Signed)
Subjective:    Patient ID: Jama FlavorsGertrude Awan, female    DOB: 27-Apr-1921, 83 y.o.   MRN: [id number]  HPI 83 yo never smoker, hx remote Legionella PNA, diagnosed with suspected asthma over 10 years ago. PFT's 7/10 with mixed disease, mild AFL. Formerly treated with Advair and albuterol, stopped when she moved to [place] in 2008. Stayed on Singulair. At original consult we d/c'd ACE-I. Started on Symbicort.    ROV 03/05/16 -- Mrs. Harting has a history of bronchiectasis and upper airway irritation with chronic cough, probably also true asthma. She's been managed on chronic prednisone, Protonix. She's had multiple episodes of acute bronchitic syndrome, most recently treated 02/22/16. She increased her prednisone to 10 mg daily for 1 week and took doxycycline. She is now back down to prednisone 5 mg daily.  She has been rx with serial abx (also for a cellulitis) since November.   ROV 04/26/16 -- follow up visit for bronchiectasis, chronic cough, UA irritation, some true asthma. She has been managed on chronic pred at 5mg  qd. She has reliable bouts of cough. Happen daily. She is on rotating doxycycline and azithromycin at the beginning of each month. The sputum is a bit lighter. She uses flutter valve but not reliable.   ROV 07/26/16 -- this is a follow-up visit for patient with a history of bronchiectasis, chronic cough, upper airway irritation with some superimposed bronchospasm and true asthma.We have managed her on chronic prednisone 5 mg daily as well as rotating antibiotics (doxycycline, azithromycin). She had an increase in her mucous production, clear to green mucous. She was treated with omnicef, has improved - less mucous, feels better. Did not require extra pred. She has xopenex to use prn, also uses xopenex nebs bid on a schedule.   ROV 10/29/16 -- Mrs. Antonini is [id number] and has a history of bronchiectasis, asthma, chronic cough and upper airway irritation. She is currently on chronic prednisone 5 mg daily,  rotating antibiotics (doxycycline, azithromycin). She was having more difficulty in late September and I asked her to take her azithromycin early + a pred taper, which she tolerated and seemed to benefit from. She gets more benefit, better control of her mucous with the azithro. Her sputum and cough increased over the summer - increased maintenance. She is using flutter. Uses xopenex bid.   ROV 12/10/16 --this is a 1 month follow-up visit for patient with a history of bronchiectasis with associated obstructive lung disease, chronic cough and upper airway irritation.  She is on scheduled chronic prednisone 10 mg daily (increased 10/16)  As well as rotating doxycycline, Cefdinir, azithromycin.  We had planned for repeat respiratory cultures but these have not yet been done. She is coughing all day, almost always productive. She is using xopenex tid.     Review of Systems As above       Objective:   Physical Exam  Vitals:   12/10/16 0931  BP: 120/68  Pulse: 93  SpO2: 92%  Weight: 144 lb 6 oz (65.5 kg)  Height: 5\' 7"  (1.702 m)    GEN: A/Ox3; pleasant , NAD, elderly and thin   HEENT:  Op clear,   NECK:  No stridor  RESP scattered rhonchi bilaterally, no overt wheezing  CARD:  RRR, no m/r/g  , no peripheral edema, pulses intact, no cyanosis or clubbing.  Musco: Warm bil, no deformities or joint swelling noted.   Neuro: alert, no focal deficits noted.    Skin: Warm, no lesions or rashes  Assessment & Plan:  BRONCHIECTASIS Continues to have productive cough all day long.  Purulent mucus.  Certainly this could relate to less effective maintenance care and chronic secretion clearance, but I am concerned that there is an acute superimposed infection, consider resistant organism, consider Mycobacterium avium.  She is intolerant of levofloxacin, Augmentin so I will not treat her empirically today.  We will obtain sputum cultures today and based on results tailor appropriate  antibiotics.  She may ultimately require IV antibiotics or hospital admission to adequately treat.  Chest x-ray today.  Unclear whether a repeat CT scan will give us useful information in this setting.  Avoid prednisone taper in absence of overt wheezing.  With regard to her maintenance we will continue Xopenex scheduled nebulizers, add 3% saline and more reliable flutter valve.  I will also evaluate her for possible chest vest percussion to see if this can benefit.  Continue to rotate her antibiotics, suspect we will start her Cefdinir early (after cultures have been obtained).  Levy Pupaobert Shaiden Aldous, MD, PhD 12/10/2016, 9:57 AM [facility] [phone] or if no answer [phone]

## 2016-12-10 NOTE — Assessment & Plan Note (Signed)
Continues to have productive cough all day long.  Purulent mucus.  Certainly this could relate to less effective maintenance care and chronic secretion clearance, but I am concerned that there is an acute superimposed infection, consider resistant organism, consider Mycobacterium avium.  She is intolerant of levofloxacin, Augmentin so I will not treat her empirically today.  We will obtain sputum cultures today and based on results tailor appropriate antibiotics.  She may ultimately require IV antibiotics or hospital admission to adequately treat.  Chest x-ray today.  Unclear whether a repeat CT scan will give us useful information in this setting.  Avoid prednisone taper in absence of overt wheezing.  With regard to her maintenance we will continue Xopenex scheduled nebulizers, add 3% saline and more reliable flutter valve.  I will also evaluate her for possible chest vest percussion to see if this can benefit.  Continue to rotate her antibiotics, suspect we will start her Cefdinir early (after cultures have been obtained).

## 2016-12-10 NOTE — Patient Instructions (Addendum)
Sputum sample today Please continue prednisone 10 mg daily Continue Xopenex nebulized 3 times a day We will start hypertonic 3% saline nebulizers 2 times a day. Try to use your flutter valve on a schedule, again 2 times a day We will evaluate you for a chest percussion vest to see if this can help with secretion clearance Chest x-ray today We will continue to rotate antibiotics: Doxycycline, cefdinir, azithromycin Follow with Dr Delton CoombesByrum in 1 month

## 2016-12-16 LAB — RESPIRATORY CULTURE OR RESPIRATORY AND SPUTUM CULTURE
MICRO NUMBER: 81330214
SPECIMEN QUALITY: ADEQUATE

## 2016-12-23 ENCOUNTER — Ambulatory Visit: Payer: Self-pay | Admitting: Emergency Medicine

## 2017-01-03 ENCOUNTER — Telehealth: Payer: Self-pay | Admitting: Emergency Medicine

## 2017-01-03 NOTE — Telephone Encounter (Signed)
ATC pt, no answer. Left message for pt to call back.  

## 2017-01-08 ENCOUNTER — Telehealth: Payer: Self-pay | Admitting: Emergency Medicine

## 2017-01-08 NOTE — Telephone Encounter (Signed)
Called and spoke with pt's daughter, Sarah [name] stating that MW has said pt needs to go to the ER for further treatment. Sarah [name] expressed understanding. Nothing further needed.

## 2017-01-08 NOTE — Telephone Encounter (Signed)
ATC SUe, no answer. Left message for her to call back.,

## 2017-01-08 NOTE — Telephone Encounter (Signed)
Spoke with patient's daughter Johnny BridgeMartha. She stated that the patient was placed on antibiotics by RB. She is currently on azithromycin 250mg  and cefdinir 300mg . Patient is not feeling better and is actually starting to get worse.   She developed a fever on Monday and the chest congestion has not improved. Denied any complaints of body aches.   Wishes to [address] [facility] [address]   MW, please advise since RB is out of the office today. Thanks!

## 2017-01-08 NOTE — Telephone Encounter (Signed)
Cannot tolerate levaquin with serious side effects to unfortunately nothing else to offer as outpt > likely will need something given IV as inpt  Or at least  in ER (not office) setting x first dose to be sure she tolerates so rec go to ER

## 2017-01-09 LAB — FUNGUS CULTURE W SMEAR
MICRO NUMBER:: 81330212
SMEAR:: NONE SEEN
SPECIMEN QUALITY: ADEQUATE

## 2017-01-09 NOTE — Telephone Encounter (Signed)
Left message for Sarah [name] to call back. Per the VM, Sarah [name] will not be back in her office until Dec 31st. Will await her call back.

## 2017-01-15 ENCOUNTER — Telehealth: Payer: Self-pay | Admitting: Emergency Medicine

## 2017-01-15 NOTE — Telephone Encounter (Signed)
We have attempted to contact Sarah [name] several times with no success or call back. Per triage protocol, message will be closed.

## 2017-01-15 NOTE — Telephone Encounter (Signed)
Spoke with Sarah [name] with [employer], out of the office last week due to the holidays Sarah [name] is [facility] that pt has tried flutter value and it failed to make pt better Letter is needing be faxed to Jola Schmidtttn Sue at fax number [phone]  RB please advise.

## 2017-01-16 ENCOUNTER — Ambulatory Visit (INDEPENDENT_AMBULATORY_CARE_PROVIDER_SITE_OTHER): Payer: Medicare Other | Admitting: Psychiatry

## 2017-01-16 DIAGNOSIS — F410 Panic disorder [episodic paroxysmal anxiety] without agoraphobia: Secondary | ICD-10-CM

## 2017-01-16 DIAGNOSIS — F4322 Adjustment disorder with anxiety: Secondary | ICD-10-CM

## 2017-01-17 NOTE — Telephone Encounter (Signed)
Spoke with Fannie KneeSue with smart [employer]; phone [phone], fax [phone] Marko StaiFaxed Sue letter of tried and failed of flutter valve for patient per RB Albertina Parrokayed Sue rec'd fax, nothing further needed

## 2017-01-17 NOTE — Telephone Encounter (Signed)
Okay to send a letter that details ineffectiveness of her flutter valve

## 2017-01-23 LAB — MYCOBACTERIA,CULT W/FLUOROCHROME SMEAR
MICRO NUMBER: 81330215
SMEAR:: NONE SEEN
SPECIMEN QUALITY: ADEQUATE

## 2017-01-25 ENCOUNTER — Inpatient Hospital Stay (HOSPITAL_COMMUNITY): Payer: Medicare Other

## 2017-01-25 ENCOUNTER — Inpatient Hospital Stay (HOSPITAL_COMMUNITY)
Admission: EM | Admit: 2017-01-25 | Discharge: 2017-01-29 | DRG: 391 | Disposition: A | Discharge status: Skilled Nursing Facility | Payer: Medicare Other | Attending: Internal Medicine | Admitting: Internal Medicine

## 2017-01-25 ENCOUNTER — Emergency Department (HOSPITAL_COMMUNITY): Payer: Medicare Other

## 2017-01-25 ENCOUNTER — Encounter (HOSPITAL_COMMUNITY): Payer: Self-pay | Admitting: Internal Medicine

## 2017-01-25 DIAGNOSIS — I10 Essential (primary) hypertension: Secondary | ICD-10-CM | POA: Diagnosis not present

## 2017-01-25 DIAGNOSIS — I252 Old myocardial infarction: Secondary | ICD-10-CM

## 2017-01-25 DIAGNOSIS — Z8042 Family history of malignant neoplasm of prostate: Secondary | ICD-10-CM | POA: Diagnosis not present

## 2017-01-25 DIAGNOSIS — Z66 Do not resuscitate: Secondary | ICD-10-CM | POA: Diagnosis present

## 2017-01-25 DIAGNOSIS — E860 Dehydration: Secondary | ICD-10-CM | POA: Diagnosis not present

## 2017-01-25 DIAGNOSIS — R2689 Other abnormalities of gait and mobility: Secondary | ICD-10-CM | POA: Diagnosis not present

## 2017-01-25 DIAGNOSIS — H409 Unspecified glaucoma: Secondary | ICD-10-CM | POA: Diagnosis present

## 2017-01-25 DIAGNOSIS — Z7902 Long term (current) use of antithrombotics/antiplatelets: Secondary | ICD-10-CM

## 2017-01-25 DIAGNOSIS — F411 Generalized anxiety disorder: Secondary | ICD-10-CM | POA: Diagnosis present

## 2017-01-25 DIAGNOSIS — Z9181 History of falling: Secondary | ICD-10-CM

## 2017-01-25 DIAGNOSIS — J479 Bronchiectasis, uncomplicated: Secondary | ICD-10-CM | POA: Diagnosis present

## 2017-01-25 DIAGNOSIS — Z823 Family history of stroke: Secondary | ICD-10-CM | POA: Diagnosis not present

## 2017-01-25 DIAGNOSIS — M19042 Primary osteoarthritis, left hand: Secondary | ICD-10-CM | POA: Diagnosis present

## 2017-01-25 DIAGNOSIS — N179 Acute kidney failure, unspecified: Secondary | ICD-10-CM | POA: Diagnosis not present

## 2017-01-25 DIAGNOSIS — R000 Tachycardia, unspecified: Secondary | ICD-10-CM

## 2017-01-25 DIAGNOSIS — Z881 Allergy status to other antibiotic agents status: Secondary | ICD-10-CM

## 2017-01-25 DIAGNOSIS — I110 Hypertensive heart disease with heart failure: Secondary | ICD-10-CM | POA: Diagnosis present

## 2017-01-25 DIAGNOSIS — Z8249 Family history of ischemic heart disease and other diseases of the circulatory system: Secondary | ICD-10-CM

## 2017-01-25 DIAGNOSIS — J9601 Acute respiratory failure with hypoxia: Secondary | ICD-10-CM | POA: Diagnosis present

## 2017-01-25 DIAGNOSIS — Z885 Allergy status to narcotic agent status: Secondary | ICD-10-CM

## 2017-01-25 DIAGNOSIS — I5032 Chronic diastolic (congestive) heart failure: Secondary | ICD-10-CM | POA: Diagnosis not present

## 2017-01-25 DIAGNOSIS — Z8673 Personal history of transient ischemic attack (TIA), and cerebral infarction without residual deficits: Secondary | ICD-10-CM

## 2017-01-25 DIAGNOSIS — Z90722 Acquired absence of ovaries, bilateral: Secondary | ICD-10-CM

## 2017-01-25 DIAGNOSIS — R1311 Dysphagia, oral phase: Secondary | ICD-10-CM | POA: Diagnosis not present

## 2017-01-25 DIAGNOSIS — Z90710 Acquired absence of both cervix and uterus: Secondary | ICD-10-CM

## 2017-01-25 DIAGNOSIS — M6281 Muscle weakness (generalized): Secondary | ICD-10-CM | POA: Diagnosis not present

## 2017-01-25 DIAGNOSIS — Z9109 Other allergy status, other than to drugs and biological substances: Secondary | ICD-10-CM | POA: Diagnosis not present

## 2017-01-25 DIAGNOSIS — M19041 Primary osteoarthritis, right hand: Secondary | ICD-10-CM | POA: Diagnosis present

## 2017-01-25 DIAGNOSIS — F331 Major depressive disorder, recurrent, moderate: Secondary | ICD-10-CM | POA: Diagnosis not present

## 2017-01-25 DIAGNOSIS — E7800 Pure hypercholesterolemia, unspecified: Secondary | ICD-10-CM | POA: Diagnosis present

## 2017-01-25 DIAGNOSIS — I671 Cerebral aneurysm, nonruptured: Secondary | ICD-10-CM | POA: Diagnosis present

## 2017-01-25 DIAGNOSIS — Z792 Long term (current) use of antibiotics: Secondary | ICD-10-CM

## 2017-01-25 DIAGNOSIS — R270 Ataxia, unspecified: Secondary | ICD-10-CM | POA: Diagnosis not present

## 2017-01-25 DIAGNOSIS — J449 Chronic obstructive pulmonary disease, unspecified: Secondary | ICD-10-CM

## 2017-01-25 DIAGNOSIS — Z7952 Long term (current) use of systemic steroids: Secondary | ICD-10-CM | POA: Diagnosis not present

## 2017-01-25 DIAGNOSIS — Z79899 Other long term (current) drug therapy: Secondary | ICD-10-CM

## 2017-01-25 DIAGNOSIS — R197 Diarrhea, unspecified: Secondary | ICD-10-CM | POA: Diagnosis not present

## 2017-01-25 DIAGNOSIS — I2510 Atherosclerotic heart disease of native coronary artery without angina pectoris: Secondary | ICD-10-CM | POA: Diagnosis present

## 2017-01-25 DIAGNOSIS — F419 Anxiety disorder, unspecified: Secondary | ICD-10-CM | POA: Diagnosis present

## 2017-01-25 DIAGNOSIS — K219 Gastro-esophageal reflux disease without esophagitis: Secondary | ICD-10-CM | POA: Diagnosis present

## 2017-01-25 DIAGNOSIS — E785 Hyperlipidemia, unspecified: Secondary | ICD-10-CM | POA: Diagnosis present

## 2017-01-25 DIAGNOSIS — F329 Major depressive disorder, single episode, unspecified: Secondary | ICD-10-CM | POA: Diagnosis present

## 2017-01-25 DIAGNOSIS — I361 Nonrheumatic tricuspid (valve) insufficiency: Secondary | ICD-10-CM | POA: Diagnosis not present

## 2017-01-25 DIAGNOSIS — Z79890 Hormone replacement therapy (postmenopausal): Secondary | ICD-10-CM

## 2017-01-25 DIAGNOSIS — E039 Hypothyroidism, unspecified: Secondary | ICD-10-CM | POA: Diagnosis present

## 2017-01-25 DIAGNOSIS — A0811 Acute gastroenteropathy due to Norwalk agent: Principal | ICD-10-CM | POA: Diagnosis present

## 2017-01-25 DIAGNOSIS — K449 Diaphragmatic hernia without obstruction or gangrene: Secondary | ICD-10-CM | POA: Diagnosis present

## 2017-01-25 DIAGNOSIS — R278 Other lack of coordination: Secondary | ICD-10-CM | POA: Diagnosis not present

## 2017-01-25 DIAGNOSIS — R112 Nausea with vomiting, unspecified: Secondary | ICD-10-CM

## 2017-01-25 LAB — COMPREHENSIVE METABOLIC PANEL
ALBUMIN: 3.3 g/dL — AB (ref 3.5–5.0)
ALT: 12 U/L — ABNORMAL LOW (ref 14–54)
AST: 40 U/L (ref 15–41)
Alkaline Phosphatase: 55 U/L (ref 38–126)
Anion gap: 9 (ref 5–15)
BILIRUBIN TOTAL: 1.3 mg/dL — AB (ref 0.3–1.2)
BUN: 26 mg/dL — AB (ref 6–20)
CHLORIDE: 102 mmol/L (ref 101–111)
CO2: 24 mmol/L (ref 22–32)
Calcium: 8 mg/dL — ABNORMAL LOW (ref 8.9–10.3)
Creatinine, Ser: 1.12 mg/dL — ABNORMAL HIGH (ref 0.44–1.00)
GFR calc Af Amer: 47 mL/min — ABNORMAL LOW (ref >60)
GFR calc non Af Amer: 41 mL/min — ABNORMAL LOW (ref >60)
GLUCOSE: 116 mg/dL — AB (ref 65–99)
Potassium: 5 mmol/L (ref 3.5–5.1)
Sodium: 135 mmol/L (ref 135–145)
TOTAL PROTEIN: 6.6 g/dL (ref 6.5–8.1)

## 2017-01-25 LAB — HEPATIC FUNCTION PANEL
ALK PHOS: 54 U/L (ref 38–126)
ALT: 11 U/L — AB (ref 14–54)
AST: 40 U/L (ref 15–41)
Albumin: 3.4 g/dL — ABNORMAL LOW (ref 3.5–5.0)
BILIRUBIN INDIRECT: 0.9 mg/dL (ref 0.3–0.9)
BILIRUBIN TOTAL: 1.4 mg/dL — AB (ref 0.3–1.2)
Bilirubin, Direct: 0.5 mg/dL (ref 0.1–0.5)
Total Protein: 6.7 g/dL (ref 6.5–8.1)

## 2017-01-25 LAB — I-STAT TROPONIN, ED: Troponin i, poc: 0.03 ng/mL (ref 0.00–0.08)

## 2017-01-25 LAB — URINALYSIS, ROUTINE W REFLEX MICROSCOPIC
Bilirubin Urine: NEGATIVE
Glucose, UA: NEGATIVE mg/dL
KETONES UR: NEGATIVE mg/dL
Nitrite: NEGATIVE
PROTEIN: NEGATIVE mg/dL
Specific Gravity, Urine: 1.018 (ref 1.005–1.030)
pH: 5 (ref 5.0–8.0)

## 2017-01-25 LAB — CBC
HEMATOCRIT: 45.4 % (ref 36.0–46.0)
HEMOGLOBIN: 13.8 g/dL (ref 12.0–15.0)
MCH: 29 pg (ref 26.0–34.0)
MCHC: 30.4 g/dL (ref 30.0–36.0)
MCV: 95.4 fL (ref 78.0–100.0)
Platelets: 362 10*3/uL (ref 150–400)
RBC: 4.76 MIL/uL (ref 3.87–5.11)
RDW: 13.9 % (ref 11.5–15.5)
WBC: 7 10*3/uL (ref 4.0–10.5)

## 2017-01-25 LAB — LIPASE, BLOOD: Lipase: 18 U/L (ref 11–51)

## 2017-01-25 MED ORDER — CLOPIDOGREL BISULFATE 75 MG PO TABS
75.0000 mg | ORAL_TABLET | Freq: Every day | ORAL | Status: DC
  Administered 2017-01-26 – 2017-01-29 (×4): 75 mg via ORAL
  Filled 2017-01-25 (×4): qty 1

## 2017-01-25 MED ORDER — HYDROCHLOROTHIAZIDE 25 MG PO TABS
12.5000 mg | ORAL_TABLET | Freq: Every day | ORAL | Status: DC
  Administered 2017-01-26 – 2017-01-29 (×4): 12.5 mg via ORAL
  Filled 2017-01-25 (×4): qty 1

## 2017-01-25 MED ORDER — LEVALBUTEROL HCL 1.25 MG/0.5ML IN NEBU: 1.2500 mg | INHALATION_SOLUTION | Freq: Two times a day (BID) | RESPIRATORY_TRACT | Status: DC

## 2017-01-25 MED ORDER — SODIUM CHLORIDE 3 % IN NEBU
4.0000 mL | INHALATION_SOLUTION | Freq: Two times a day (BID) | RESPIRATORY_TRACT | Status: AC
  Administered 2017-01-27 – 2017-01-28 (×3): 4 mL via RESPIRATORY_TRACT
  Filled 2017-01-25 (×6): qty 4

## 2017-01-25 MED ORDER — ALBUTEROL SULFATE (2.5 MG/3ML) 0.083% IN NEBU: 2.5000 mg | INHALATION_SOLUTION | Freq: Two times a day (BID) | RESPIRATORY_TRACT | Status: DC

## 2017-01-25 MED ORDER — ALBUTEROL SULFATE (2.5 MG/3ML) 0.083% IN NEBU: 2.5000 mg | INHALATION_SOLUTION | Freq: Four times a day (QID) | RESPIRATORY_TRACT | Status: DC | PRN

## 2017-01-25 MED ORDER — CARVEDILOL 6.25 MG PO TABS
6.2500 mg | ORAL_TABLET | Freq: Once | ORAL | Status: AC
  Administered 2017-01-25: 6.25 mg via ORAL
  Filled 2017-01-25: qty 1

## 2017-01-25 MED ORDER — ATORVASTATIN CALCIUM 10 MG PO TABS
10.0000 mg | ORAL_TABLET | Freq: Every day | ORAL | Status: DC
  Administered 2017-01-26 – 2017-01-28 (×3): 10 mg via ORAL
  Filled 2017-01-25 (×3): qty 1

## 2017-01-25 MED ORDER — ACETAMINOPHEN 325 MG PO TABS
650.0000 mg | ORAL_TABLET | Freq: Four times a day (QID) | ORAL | Status: DC | PRN
  Administered 2017-01-27 – 2017-01-29 (×5): 650 mg via ORAL
  Filled 2017-01-25 (×5): qty 2

## 2017-01-25 MED ORDER — SODIUM CHLORIDE 0.9 % IV BOLUS (SEPSIS)
500.0000 mL | Freq: Once | INTRAVENOUS | Status: AC
  Administered 2017-01-25: 500 mL via INTRAVENOUS

## 2017-01-25 MED ORDER — LEVALBUTEROL HCL 1.25 MG/0.5ML IN NEBU
1.2500 mg | INHALATION_SOLUTION | Freq: Once | RESPIRATORY_TRACT | Status: AC
  Administered 2017-01-25: 1.25 mg via RESPIRATORY_TRACT
  Filled 2017-01-25: qty 0.5

## 2017-01-25 MED ORDER — SODIUM CHLORIDE 3 % IN NEBU
4.0000 mL | INHALATION_SOLUTION | Freq: Two times a day (BID) | RESPIRATORY_TRACT | Status: DC
  Filled 2017-01-25: qty 4

## 2017-01-25 MED ORDER — SODIUM CHLORIDE 0.9 % IV BOLUS (SEPSIS)
1000.0000 mL | Freq: Once | INTRAVENOUS | Status: AC
  Administered 2017-01-25: 1000 mL via INTRAVENOUS

## 2017-01-25 MED ORDER — LOSARTAN POTASSIUM 50 MG PO TABS
50.0000 mg | ORAL_TABLET | Freq: Two times a day (BID) | ORAL | Status: DC
  Administered 2017-01-25 – 2017-01-29 (×8): 50 mg via ORAL
  Filled 2017-01-25 (×8): qty 1

## 2017-01-25 MED ORDER — PREDNISONE 10 MG PO TABS
10.0000 mg | ORAL_TABLET | Freq: Every day | ORAL | Status: DC
  Administered 2017-01-26 – 2017-01-29 (×4): 10 mg via ORAL
  Filled 2017-01-25 (×4): qty 1

## 2017-01-25 MED ORDER — LORATADINE 10 MG PO TABS
10.0000 mg | ORAL_TABLET | Freq: Every day | ORAL | Status: DC
  Administered 2017-01-26 – 2017-01-29 (×4): 10 mg via ORAL
  Filled 2017-01-25 (×4): qty 1

## 2017-01-25 MED ORDER — LATANOPROST 0.005 % OP SOLN
1.0000 [drp] | Freq: Every day | OPHTHALMIC | Status: DC
  Administered 2017-01-26 – 2017-01-28 (×4): 1 [drp] via OPHTHALMIC
  Filled 2017-01-25: qty 2.5

## 2017-01-25 MED ORDER — DULOXETINE HCL 20 MG PO CPEP
20.0000 mg | ORAL_CAPSULE | Freq: Once | ORAL | Status: AC
  Administered 2017-01-25: 20 mg via ORAL
  Filled 2017-01-25: qty 1

## 2017-01-25 MED ORDER — ESCITALOPRAM OXALATE 5 MG PO TABS
2.5000 mg | ORAL_TABLET | Freq: Every day | ORAL | Status: DC
  Administered 2017-01-26 – 2017-01-29 (×4): 2.5 mg via ORAL
  Filled 2017-01-25 (×4): qty 1

## 2017-01-25 MED ORDER — ENOXAPARIN SODIUM 30 MG/0.3ML ~~LOC~~ SOLN
30.0000 mg | Freq: Every day | SUBCUTANEOUS | Status: DC
  Administered 2017-01-26 – 2017-01-28 (×4): 30 mg via SUBCUTANEOUS
  Filled 2017-01-25 (×4): qty 0.3

## 2017-01-25 MED ORDER — CLONAZEPAM 0.5 MG PO TABS
0.2500 mg | ORAL_TABLET | Freq: Every day | ORAL | Status: DC
  Administered 2017-01-25 – 2017-01-28 (×4): 0.25 mg via ORAL
  Filled 2017-01-25 (×4): qty 1

## 2017-01-25 MED ORDER — SODIUM CHLORIDE 0.9 % IV SOLN
INTRAVENOUS | Status: DC
  Administered 2017-01-25 – 2017-01-26 (×2): via INTRAVENOUS

## 2017-01-25 MED ORDER — ONDANSETRON HCL 4 MG/2ML IJ SOLN
4.0000 mg | Freq: Four times a day (QID) | INTRAMUSCULAR | Status: DC | PRN
  Administered 2017-01-29: 4 mg via INTRAVENOUS
  Filled 2017-01-25: qty 2

## 2017-01-25 MED ORDER — ONDANSETRON HCL 4 MG/2ML IJ SOLN
4.0000 mg | Freq: Once | INTRAMUSCULAR | Status: AC
  Administered 2017-01-25: 4 mg via INTRAVENOUS
  Filled 2017-01-25: qty 2

## 2017-01-25 MED ORDER — CARVEDILOL 6.25 MG PO TABS
6.2500 mg | ORAL_TABLET | Freq: Two times a day (BID) | ORAL | Status: DC
  Administered 2017-01-26 – 2017-01-29 (×7): 6.25 mg via ORAL
  Filled 2017-01-25 (×7): qty 1

## 2017-01-25 MED ORDER — ALPRAZOLAM 0.5 MG PO TABS
0.5000 mg | ORAL_TABLET | Freq: Once | ORAL | Status: AC
  Administered 2017-01-25: 0.5 mg via ORAL
  Filled 2017-01-25: qty 1

## 2017-01-25 MED ORDER — GABAPENTIN 100 MG PO CAPS
100.0000 mg | ORAL_CAPSULE | Freq: Every day | ORAL | Status: DC
  Administered 2017-01-26 – 2017-01-29 (×4): 100 mg via ORAL
  Filled 2017-01-25 (×4): qty 1

## 2017-01-25 MED ORDER — PANTOPRAZOLE SODIUM 40 MG PO TBEC
40.0000 mg | DELAYED_RELEASE_TABLET | Freq: Every day | ORAL | Status: DC
  Administered 2017-01-26 – 2017-01-29 (×4): 40 mg via ORAL
  Filled 2017-01-25 (×4): qty 1

## 2017-01-25 MED ORDER — ACETAMINOPHEN 650 MG RE SUPP: 650.0000 mg | Freq: Four times a day (QID) | RECTAL | Status: DC | PRN

## 2017-01-25 MED ORDER — LEVOTHYROXINE SODIUM 50 MCG PO TABS
50.0000 ug | ORAL_TABLET | Freq: Every day | ORAL | Status: DC
  Administered 2017-01-26 – 2017-01-29 (×4): 50 ug via ORAL
  Filled 2017-01-25 (×4): qty 1

## 2017-01-25 NOTE — ED Triage Notes (Addendum)
Pt to Ed via [facility] with complaints of N/V/D for the past 8-12 hours. Pt is from independent living and family tried using Immodium with no relief. Pt is tachycardic and family is on the way. Pt has h/x of COPD and has Brochietasis is on breathing treatments and hypertonic saline 1x day. Pt is on Plavix and has a skin tear on her right lower shin.  HR 126 BP 130/90 RR 18RR CBG 180

## 2017-01-25 NOTE — ED Notes (Signed)
ED TO INPATIENT HANDOFF REPORT  Name/Age/Gender [name] 84 y.o. female  Code Status Code Status History    Date Active Date Inactive Code Status Order ID Comments User Context   10/06/2014 20:02 10/08/2014 15:16 Full Code [id number]  Etta Quill, DO ED   07/08/2014 13:08 07/10/2014 17:01 DNR [id number]  Oswald Hillock, MD Inpatient   04/15/2012 19:39 04/16/2012 16:29 Full Code [id number]  Geradine Girt, DO Inpatient   11/23/2011 04:59 11/24/2011 14:36 Full Code [id number]  Fara Boros, RN Inpatient      Home/SNF/Other Home- independent living  Chief Complaint n/v/d  Level of Care/Admitting Diagnosis ED Disposition    ED Disposition Condition [facility] Area: [facility] [[id number]]  Level of Care: Telemetry [5]  Admit to tele based on following criteria: Monitor for Ischemic changes  Diagnosis: Nausea & vomiting [115726]  Admitting Physician: Jani Gravel [[id number]]  Attending Physician: Jani Gravel [phone]  Estimated length of stay: past midnight tomorrow  Certification:: I certify this patient will need inpatient services for at least 2 midnights  PT Class (Do Not Modify): Inpatient [101]  PT Acc Code (Do Not Modify): Private [1]       Medical History Past Medical History:  Diagnosis Date  . Allergic rhinitis   . Anxiety    Anxiety attacks  . Arthritis    "a little; in my back and hands" (04/15/2012)  . Asthma   . Bronchiectasis    with history of Legionaires with chronic rales/rhonchi  . Chest pain at rest   . Daily headache    "over the last week" 04/15/2012   . Depression   . Diastolic dysfunction    a. per echo 08/2010 with normal LV function;  b. 06/2011 Echo: EF 65-70%  . GERD (gastroesophageal reflux disease)    "just recently" (04/15/2012)  . H/O hiatal hernia   . H/O Legionnaire's disease 11/1976  . History of blood transfusion 1972   "w/hysterectomy" (04/15/2012)  . Hypercholesteremia   . Hypertension    Negative  renal duplex in December of 2012  . Hypothyroidism   . Idiopathic peripheral neuropathy   . MAC (mycobacterium avium-intracellulare complex)   . Migraines    "outgrew them" (04/15/2012)  . NSTEMI (non-ST elevated myocardial infarction) ([place])    a. Normal coronaries per cath August 2012 and negative CT angio for PE;  b. 06/2011 Repeat admission w/ chest pain and elevated troponin's, CTA Chest w/o PE  . Osteopenia 02/2011   t score - 2.1  . Pneumonia    "recurrent" (04/15/2012)  . Pollen allergies   . Shortness of breath    "sometimes just lying down" (04/15/2012)  . Stroke ([place]) 06/2014   cerebellum    Allergies Allergies  Allergen Reactions  . Codeine Anaphylaxis  . Hydrocodone Nausea And Vomiting and Other (See Comments)    SYNCOPE AND BRADYCARDIA  . Isosorbide Nitrate Other (See Comments)    BRADYCARDIA  . Oxycodone Palpitations    Rapid heart beat  . Clarithromycin Nausea And Vomiting    Has patient had a PCN reaction causing immediate rash, facial/tongue/throat swelling, SOB or lightheadedness with hypotension: Unknown Has patient had a PCN reaction causing severe rash involving mucus membranes or skin necrosis: Unknown Has patient had a PCN reaction that required hospitalization: Unknown Has patient had a PCN reaction occurring within the last 10 years: Unknown If all of the above answers are "NO", then may proceed with Cephalosporin use.   Marland Kitchen  Fiorinal [Butalbital-Aspirin-Caffeine] Swelling and Other (See Comments)    Facial swelling   . Levofloxacin Nausea And Vomiting and Other (See Comments)    SYNCOPE ALSO  . Augmentin [Amoxicillin-Pot Clavulanate] Diarrhea    IV Location/Drains/Wounds Patient Lines/Drains/Airways Status   Active Line/Drains/Airways    Name:   Placement date:   Placement time:   Site:   Days:   Peripheral IV 01/25/17 Left Forearm   01/25/17    1606    Forearm   less than 1          Labs/Imaging Results for orders placed or performed during the  hospital encounter of 01/25/17 (from the past 48 hour(s))  CBC     Status: None   Collection Time: 01/25/17  4:43 PM  Result Value Ref Range   WBC 7.0 4.0 - 10.5 K/uL   RBC 4.76 3.87 - 5.11 MIL/uL   Hemoglobin 13.8 12.0 - 15.0 g/dL   HCT 45.4 36.0 - 46.0 %   MCV 95.4 78.0 - 100.0 fL   MCH 29.0 26.0 - 34.0 pg   MCHC 30.4 30.0 - 36.0 g/dL   RDW 13.9 11.5 - 15.5 %   Platelets 362 150 - 400 K/uL  Comprehensive metabolic panel     Status: Abnormal   Collection Time: 01/25/17  4:43 PM  Result Value Ref Range   Sodium 135 135 - 145 mmol/L   Potassium 5.0 3.5 - 5.1 mmol/L   Chloride 102 101 - 111 mmol/L   CO2 24 22 - 32 mmol/L   Glucose, Bld 116 (H) 65 - 99 mg/dL   BUN 26 (H) 6 - 20 mg/dL   Creatinine, Ser 1.12 (H) 0.44 - 1.00 mg/dL   Calcium 8.0 (L) 8.9 - 10.3 mg/dL   Total Protein 6.6 6.5 - 8.1 g/dL   Albumin 3.3 (L) 3.5 - 5.0 g/dL   AST 40 15 - 41 U/L   ALT 12 (L) 14 - 54 U/L   Alkaline Phosphatase 55 38 - 126 U/L   Total Bilirubin 1.3 (H) 0.3 - 1.2 mg/dL   GFR calc non Af Amer 41 (L) >60 mL/min   GFR calc Af Amer 47 (L) >60 mL/min    Comment: (NOTE) The eGFR has been calculated using the CKD EPI equation. This calculation has not been validated in all clinical situations. eGFR's persistently <60 mL/min signify possible Chronic Kidney Disease.    Anion gap 9 5 - 15  Urinalysis, Routine w reflex microscopic     Status: Abnormal   Collection Time: 01/25/17  6:32 PM  Result Value Ref Range   Color, Urine YELLOW YELLOW   APPearance CLEAR CLEAR   Specific Gravity, Urine 1.018 1.005 - 1.030   pH 5.0 5.0 - 8.0   Glucose, UA NEGATIVE NEGATIVE mg/dL   Hgb urine dipstick SMALL (A) NEGATIVE   Bilirubin Urine NEGATIVE NEGATIVE   Ketones, ur NEGATIVE NEGATIVE mg/dL   Protein, ur NEGATIVE NEGATIVE mg/dL   Nitrite NEGATIVE NEGATIVE   Leukocytes, UA MODERATE (A) NEGATIVE   RBC / HPF 0-5 0 - 5 RBC/hpf   WBC, UA 0-5 0 - 5 WBC/hpf   Bacteria, UA RARE (A) NONE SEEN   Squamous  Epithelial / LPF 0-5 (A) NONE SEEN   Mucus PRESENT   Hepatic function panel     Status: Abnormal   Collection Time: 01/25/17  8:28 PM  Result Value Ref Range   Total Protein 6.7 6.5 - 8.1 g/dL   Albumin 3.4 (L) 3.5 -  5.0 g/dL   AST 40 15 - 41 U/L   ALT 11 (L) 14 - 54 U/L   Alkaline Phosphatase 54 38 - 126 U/L   Total Bilirubin 1.4 (H) 0.3 - 1.2 mg/dL   Bilirubin, Direct 0.5 0.1 - 0.5 mg/dL   Indirect Bilirubin 0.9 0.3 - 0.9 mg/dL  Lipase, blood     Status: None   Collection Time: 01/25/17  8:28 PM  Result Value Ref Range   Lipase 18 11 - 51 U/L  I-stat troponin, ED     Status: None   Collection Time: 01/25/17  9:08 PM  Result Value Ref Range   Troponin i, poc 0.03 0.00 - 0.08 ng/mL   Comment 3            Comment: Due to the release kinetics of cTnI, a negative result within the first hours of the onset of symptoms does not rule out myocardial infarction with certainty. If myocardial infarction is still suspected, repeat the test at appropriate intervals.    Dg Abd Acute W/chest  Result Date: 01/25/2017 CLINICAL DATA:  Nausea and vomiting for several hours EXAM: DG ABDOMEN ACUTE W/ 1V CHEST COMPARISON:  12/10/2016, 12/01/2014 FINDINGS: Cardiac shadow is mildly enlarged. Hiatal hernia is again seen. Stable nodular density is noted in the right upper lobe unchanged from the previous exam as well as previous exams. No focal infiltrate or sizable effusion is seen. Scoliosis concave to the left is noted within the lower thoracic spine. No free air is identified. Scattered large and small bowel gas is seen. Scoliosis of the lumbar spine is noted. No other focal abnormality is seen. IMPRESSION: Chronic changes without acute abnormality. Electronically Signed   By: Inez Catalina M.D.   On: 01/25/2017 19:08    Pending Labs Unresulted Labs (From admission, onward)   Start     Ordered   01/25/17 2054  TSH  Once,   R     01/25/17 2053   01/25/17 2052  Troponin I (q 6hr x 3)  Now then every  6 hours,   R     01/25/17 2052   01/25/17 2052  D-dimer, quantitative (not at [facility])  Once,   R     01/25/17 2052      Vitals/Pain Today's Vitals   01/25/17 1616 01/25/17 1819 01/25/17 2017 01/25/17 2020  BP:  (!) 158/68  117/68  Pulse:  (!) 128 (!) 128 (!) 128  Resp:  18 (!) 25 (!) 26  Temp:  98.8 F (37.1 C)    TempSrc:  Oral    SpO2: 92% 93% 93% 92%    Isolation Precautions No active isolations  Medications Medications  carvedilol (COREG) tablet 6.25 mg (not administered)  DULoxetine (CYMBALTA) DR capsule 20 mg (not administered)  0.9 %  sodium chloride infusion (not administered)  ondansetron (ZOFRAN) injection 4 mg (4 mg Intravenous Given 01/25/17 1623)  sodium chloride 0.9 % bolus 1,000 mL (0 mLs Intravenous Stopped 01/25/17 1818)  sodium chloride 0.9 % bolus 500 mL (0 mLs Intravenous Stopped 01/25/17 1833)  ALPRAZolam (XANAX) tablet 0.5 mg (0.5 mg Oral Given 01/25/17 2111)  levalbuterol (XOPENEX) nebulizer solution 1.25 mg (1.25 mg Nebulization Given 01/25/17 2111)    Mobility walks with device

## 2017-01-25 NOTE — H&P (Signed)
TRH H&P   Patient Demographics:    Sarah [name], is a 84 y.o. female  MRN: [id number]   DOB - 1921/04/18  Admit Date - 01/25/2017  Outpatient Primary MD for the patient is [name], MD  Referring MD/NP/PA: Lajean Saver  Outpatient Specialists:   Patient coming from: [facility]  Chief Complaint  Patient presents with  . Nausea  . Diarrhea  . Emesis      HPI:    Sarah [name]  is a 84 y.o. female, w h/o CAD, Hypothyroidism, Copd, c/o n/v, diarrhea starting this am.  Pt felt week. No bloody emesis.  Pt denies dysuria, hematuria, fever, chills. Pt was on abx 3 weeks ago.  Pt was brought to ED for generalized weakness.   In ED,  Abd xray  =>  IMPRESSION: Chronic changes without acute abnormality.  Wbc 7.0, Hgb 13.8, Plt 362 Na 135, K 5.0, Glucose 116  BUn 26, Creatinie 1.12 Aast 40, Alt 12, calcium 8.0  Pt is tachycardic ST at 130 in ED,  Trop negative.   Pt will be admitted for n/v, diarrhea, and tachycardia, and generalized weakness.      Review of systems:    In addition to the HPI above, No Fever-chills, No Headache, No changes with Vision or hearing, No problems swallowing food or Liquids, No Chest pain, Cough or Shortness of Breath, No Blood in stool or Urine, No dysuria, No new skin rashes or bruises, No new joints pains-aches,  No new weakness, tingling, numbness in any extremity, No recent weight gain or loss, No polyuria, polydypsia or polyphagia, No significant Mental Stressors.  A full 10 point Review of Systems was done, except as stated above, all other Review of Systems were negative.   With Past History of the following :    Past Medical History:  Diagnosis Date  . Allergic rhinitis   . Anxiety    Anxiety attacks  . Arthritis    "a little; in my back and hands" (04/15/2012)  . Asthma   . Bronchiectasis    with  history of Legionaires with chronic rales/rhonchi  . Chest pain at rest   . Daily headache    "over the last week" 04/15/2012   . Depression   . Diastolic dysfunction    a. per echo 08/2010 with normal LV function;  b. 06/2011 Echo: EF 65-70%  . GERD (gastroesophageal reflux disease)    "just recently" (04/15/2012)  . H/O hiatal hernia   . H/O Legionnaire's disease 11/1976  . History of blood transfusion 1972   "w/hysterectomy" (04/15/2012)  . Hypercholesteremia   . Hypertension    Negative renal duplex in December of 2012  . Hypothyroidism   . Idiopathic peripheral neuropathy   . MAC (mycobacterium avium-intracellulare complex)   . Migraines    "outgrew them" (04/15/2012)  . NSTEMI (non-ST elevated myocardial infarction) ([place])  a. Normal coronaries per cath August 2012 and negative CT angio for PE;  b. 06/2011 Repeat admission w/ chest pain and elevated troponin's, CTA Chest w/o PE  . Osteopenia 02/2011   t score - 2.1  . Pneumonia    "recurrent" (04/15/2012)  . Pollen allergies   . Shortness of breath    "sometimes just lying down" (04/15/2012)  . Stroke [facility]) 06/2014   cerebellum      Past Surgical History:  Procedure Laterality Date  . APPENDECTOMY    . CARDIAC CATHETERIZATION  August 2012   Normal coronaries.  [facility]land Kitchen DILATION AND CURETTAGE OF UTERUS    . TONSILLECTOMY  1930  . TOTAL ABDOMINAL HYSTERECTOMY W/ BILATERAL SALPINGOOPHORECTOMY  1972   leiomyomata, menorrhagia      Social History:     Social History   Tobacco Use  . Smoking status: Never Smoker  . Smokeless tobacco: Never Used  Substance Use Topics  . Alcohol use: Yes    Alcohol/week: 1.2 oz    Types: 2 Glasses of wine per week    Comment: wine 1-2 week     Lives - at [facility] - walks by self, with walker at baseline   Family History :     Family History  Problem Relation Age of Onset  . Hypertension Mother        stroke  . Stroke Mother   . Cancer Brother        prostate  .  Osteoarthritis Paternal Aunt       Home Medications:   Prior to Admission medications   Medication Sig Start Date End Date Taking? Authorizing Provider  ALPRAZolam Duanne Moron) 0.5 MG tablet Take 1 tablet (0.5 mg total) by mouth 2 (two) times daily. 10/08/14  Yes Caren Griffins, MD  atorvastatin (LIPITOR) 10 MG tablet Take 1 tablet (10 mg total) by mouth daily at 6 PM. 07/10/14  Yes Tat, Shanon Brow, MD  calcium carbonate (TUMS - DOSED IN MG ELEMENTAL CALCIUM) 500 MG chewable tablet Chew 1 tablet by mouth every 8 (eight) hours as needed for indigestion or heartburn.   Yes [provider]  carvedilol (COREG) 6.25 MG tablet Take 1 tablet (6.25 mg total) by mouth 2 (two) times daily. 09/26/16  Yes Nahser, Wonda Cheng, MD  clonazePAM (KLONOPIN) 0.5 MG tablet Take 0.25 mg by mouth at bedtime.  04/06/13  Yes [provider]  clopidogrel (PLAVIX) 75 MG tablet Take 1 tablet (75 mg total) by mouth daily. 07/10/14  Yes Tat, Shanon Brow, MD  DULoxetine (CYMBALTA) 20 MG capsule Take 20 mg by mouth 3 (three) times daily. 06/14/14  Yes [provider]  escitalopram (LEXAPRO) 5 MG tablet Take 2.5 mg by mouth daily.   Yes [provider]  gabapentin (NEURONTIN) 100 MG capsule Take 100 mg by mouth daily. 02/05/16  Yes [provider]  hydrochlorothiazide (HYDRODIURIL) 25 MG tablet Take 0.5 tablets (12.5 mg total) by mouth daily. 10/16/16 01/25/17 Yes Nahser, Wonda Cheng, MD  latanoprost (XALATAN) 0.005 % ophthalmic solution Place 1 drop into both eyes at bedtime.  08/30/14  Yes [provider]  levalbuterol (XOPENEX HFA) 45 MCG/ACT inhaler Inhale 1-2 puffs into the lungs every 6 (six) hours as needed for wheezing.   Yes [provider]  levalbuterol (XOPENEX) 1.25 MG/3ML nebulizer solution Take 1.25 mg by nebulization 2 (two) times daily. At 1pm and 7pm 05/05/15  Yes Byrum, Rose Fillers, MD  levothyroxine (SYNTHROID, LEVOTHROID) 50 MCG tablet Take 50 mcg  by mouth daily.     Yes  [provider]  loratadine (CLARITIN) 10 MG tablet Take 10 mg by mouth daily.   Yes [provider]  losartan (COZAAR) 50 MG tablet Take 50 mg by mouth 2 (two) times daily.   Yes [provider]  pantoprazole (PROTONIX) 40 MG tablet Take 1 capsule 1 hour before your first meal of the day 08/08/15  Yes Byrum, Rose Fillers, MD  predniSONE (DELTASONE) 10 MG tablet Take 1 tablet (10 mg total) by mouth daily with breakfast. 10/29/16  Yes Byrum, Rose Fillers, MD  Sennosides (SENOKOT PO) Take 1 tablet by mouth every other day.   Yes [provider]  sodium chloride HYPERTONIC 3 % nebulizer solution Take by nebulization 2 (two) times daily. 12/10/16  Yes Collene Gobble, MD  VITAMIN D, CHOLECALCIFEROL, PO Take 2 tablets by mouth daily.    Yes [provider]  azithromycin (ZITHROMAX) 250 MG tablet Take 2 pills today then one a day for 4 additional days on the 20th of June, August, and October Patient not taking: Reported on 01/25/2017 10/29/16   Collene Gobble, MD  cefdinir (OMNICEF) 300 MG capsule Take 1 capsule (300 mg total) by mouth 2 (two) times daily. Patient not taking: Reported on 01/25/2017 12/10/16   Collene Gobble, MD  doxycycline (VIBRA-TABS) 100 MG tablet Take 1 tablet (100 mg total) by mouth 2 (two) times daily. Take this the 20th of May, July, Sept Patient not taking: Reported on 01/25/2017 10/29/16   Collene Gobble, MD     Allergies:     Allergies  Allergen Reactions  . Codeine Anaphylaxis  . Hydrocodone Nausea And Vomiting and Other (See Comments)    SYNCOPE AND BRADYCARDIA  . Isosorbide Nitrate Other (See Comments)    BRADYCARDIA  . Oxycodone Palpitations    Rapid heart beat  . Clarithromycin Nausea And Vomiting    Has patient had a PCN reaction causing immediate rash, facial/tongue/throat swelling, SOB or lightheadedness with hypotension: Unknown Has patient had a PCN reaction causing severe rash involving mucus membranes or skin necrosis:  Unknown Has patient had a PCN reaction that required hospitalization: Unknown Has patient had a PCN reaction occurring within the last 10 years: Unknown If all of the above answers are "NO", then may proceed with Cephalosporin use.   . Fiorinal [Butalbital-Aspirin-Caffeine] Swelling and Other (See Comments)    Facial swelling   . Levofloxacin Nausea And Vomiting and Other (See Comments)    SYNCOPE ALSO  . Augmentin [Amoxicillin-Pot Clavulanate] Diarrhea     Physical Exam:   Vitals  Blood pressure 117/68, pulse (!) 128, temperature 98.8 F (37.1 C), temperature source Oral, resp. rate (!) 26, last menstrual period 01/14/1970, SpO2 92 %.   1. General lying in bed in NAD,    2. Normal affect and insight, Not Suicidal or Homicidal, Awake Alert, Oriented X 3.  3. No F.N deficits, ALL C.Nerves Intact, Strength 5/5 all 4 extremities, Sensation intact all 4 extremities, Plantars down going.  4. Ears and Eyes appear Normal, Conjunctivae clear, PERRLA. Moist Oral Mucosa.  5. Supple Neck, No JVD, No cervical lymphadenopathy appriciated, No Carotid Bruits.  6. Symmetrical Chest wall movement, Good air movement bilaterally, CTAB.  7. RRR, No Gallops, Rubs or Murmurs, No Parasternal Heave.  8. Positive Bowel Sounds, Abdomen Soft, No tenderness, No organomegaly appriciated,No rebound -guarding or rigidity.  9.  No Cyanosis, Normal Skin Turgor, No Skin Rash or Bruise.  10. Good  muscle tone,  joints appear normal , no effusions, Normal ROM.  11. No Palpable Lymph Nodes in Neck or Axillae     Data Review:    CBC Recent Labs  Lab 01/25/17 1643  WBC 7.0  HGB 13.8  HCT 45.4  PLT 362  MCV 95.4  MCH 29.0  MCHC 30.4  RDW 13.9   ------------------------------------------------------------------------------------------------------------------  Chemistries  Recent Labs  Lab 01/25/17 1643 01/25/17 2028  NA 135  --   K 5.0  --   CL 102  --   CO2 24  --   GLUCOSE 116*  --     BUN 26*  --   CREATININE 1.12*  --   CALCIUM 8.0*  --   AST 40 40  ALT 12* 11*  ALKPHOS 55 54  BILITOT 1.3* 1.4*   ------------------------------------------------------------------------------------------------------------------ CrCl cannot be calculated (Unknown ideal weight.). ------------------------------------------------------------------------------------------------------------------ No results for input(s): TSH, T4TOTAL, T3FREE, THYROIDAB in the last 72 hours.  Invalid input(s): FREET3  Coagulation profile No results for input(s): INR, PROTIME in the last 168 hours. ------------------------------------------------------------------------------------------------------------------- No results for input(s): DDIMER in the last 72 hours. -------------------------------------------------------------------------------------------------------------------  Cardiac Enzymes No results for input(s): CKMB, TROPONINI, MYOGLOBIN in the last 168 hours.  Invalid input(s): CK ------------------------------------------------------------------------------------------------------------------ No results found for: BNP   ---------------------------------------------------------------------------------------------------------------  Urinalysis    Component Value Date/Time   COLORURINE YELLOW 01/25/2017 1832   APPEARANCEUR CLEAR 01/25/2017 1832   LABSPEC 1.018 01/25/2017 1832   PHURINE 5.0 01/25/2017 1832   GLUCOSEU NEGATIVE 01/25/2017 1832   HGBUR SMALL (A) 01/25/2017 1832   BILIRUBINUR NEGATIVE 01/25/2017 1832   KETONESUR NEGATIVE 01/25/2017 1832   PROTEINUR NEGATIVE 01/25/2017 1832   UROBILINOGEN 1.0 10/06/2014 1730   NITRITE NEGATIVE 01/25/2017 1832   LEUKOCYTESUR MODERATE (A) 01/25/2017 1832    ----------------------------------------------------------------------------------------------------------------   Imaging Results:    Dg Abd Acute W/chest  Result Date:  01/25/2017 CLINICAL DATA:  Nausea and vomiting for several hours EXAM: DG ABDOMEN ACUTE W/ 1V CHEST COMPARISON:  12/10/2016, 12/01/2014 FINDINGS: Cardiac shadow is mildly enlarged. Hiatal hernia is again seen. Stable nodular density is noted in the right upper lobe unchanged from the previous exam as well as previous exams. No focal infiltrate or sizable effusion is seen. Scoliosis concave to the left is noted within the lower thoracic spine. No free air is identified. Scattered large and small bowel gas is seen. Scoliosis of the lumbar spine is noted. No other focal abnormality is seen. IMPRESSION: Chronic changes without acute abnormality. Electronically Signed   By: Inez Catalina M.D.   On: 01/25/2017 19:08   ST at 140, nl axis, no st-t changes c/w ischemia   Assessment & Plan:    Active Problems:   Nausea & vomiting    N/v , Diarrhea GI pathogen panel C. Diff Ns iv Zofran   Tachycardia Tele Trop I q6h x3 Tsh Cardiac echo  carvedilol Generalized weakness CT brain  CAD  Cont carvedilol Cont losartan Cont plavix,   Gerd Cont  PPI  Hypertension Cont hydrochlorothiazide  Anxiety Cont clonazepam Cont duloxetine Cont Lexapro  Hypothyroidism Cont levothyroxine  Copd Cont prednisone 57m po qday  Cont xopenex    DVT Prophylaxis  Lovenox - SCDs   AM Labs Ordered, also please review Full Orders  Family Communication: Admission, patients condition and plan of care including tests being ordered have been discussed with the patient  who indicate understanding and agree with the plan and Code Status.  Code Status DNR  Likely DC to  home  Condition GUARDED   Consults called: none  Admission status: inpatient   Time spent in minutes : 45   Jani Gravel M.D on 01/25/2017 at 9:30 PM  Between 7am to 7pm - Pager - [phone]. After 7pm go to [email or web address] - password [facility]  [facility] - Office  [phone]

## 2017-01-25 NOTE — ED Provider Notes (Signed)
[facility] DEPT Provider Note   CSN: [id number] Arrival date & time: 01/25/17  1546     History   Chief Complaint Chief Complaint  Patient presents with  . Nausea  . Diarrhea  . Emesis    HPI Sarah [name] is a 84 y.o. female.  Patient c/o nausea, vomiting and diarrhea since early this AM. Multiple episodes of diarrhea, and a couple episodes or emesis. Denies abdominal pain or distension. Emesis clear to yellowish. No bloody or bilious emesis. No fever or chills. No dysuria or gu c/o. No known ill contacts or bad food ingestion. No recent abx use - past on course abx for chronic bronchitis/copd 3 weeks ago.    The history is provided by the patient and a relative.  Diarrhea   Associated symptoms include vomiting. Pertinent negatives include no abdominal pain and no headaches.  Emesis   Associated symptoms include diarrhea. Pertinent negatives include no abdominal pain, no fever and no headaches.    Past Medical History:  Diagnosis Date  . Allergic rhinitis   . Anxiety    Anxiety attacks  . Arthritis    "a little; in my back and hands" (04/15/2012)  . Asthma   . Bronchiectasis    with history of Legionaires with chronic rales/rhonchi  . Chest pain at rest   . Daily headache    "over the last week" 04/15/2012   . Depression   . Diastolic dysfunction    a. per echo 08/2010 with normal LV function;  b. 06/2011 Echo: EF 65-70%  . GERD (gastroesophageal reflux disease)    "just recently" (04/15/2012)  . H/O hiatal hernia   . H/O Legionnaire's disease 11/1976  . History of blood transfusion 1972   "w/hysterectomy" (04/15/2012)  . Hypercholesteremia   . Hypertension    Negative renal duplex in December of 2012  . Hypothyroidism   . Idiopathic peripheral neuropathy   . MAC (mycobacterium avium-intracellulare complex)   . Migraines    "outgrew them" (04/15/2012)  . NSTEMI (non-ST elevated myocardial infarction) ([place])    a. Normal coronaries per  cath August 2012 and negative CT angio for PE;  b. 06/2011 Repeat admission w/ chest pain and elevated troponin's, CTA Chest w/o PE  . Osteopenia 02/2011   t score - 2.1  . Pneumonia    "recurrent" (04/15/2012)  . Pollen allergies   . Shortness of breath    "sometimes just lying down" (04/15/2012)  . Stroke [facility]) 06/2014   cerebellum    Patient Active Problem List   Diagnosis Date Noted  . Adrenal insufficiency ([place]) 03/21/2015  . HTN (hypertension) 02/14/2015  . Fecal impaction ([place]) 10/07/2014  . Colitis 10/06/2014  . Cerebral infarction due to thrombosis of basilar artery ([place]) 09/09/2014  . Aneurysm, cerebral, nonruptured 09/09/2014  . HLD (hyperlipidemia) 09/09/2014  . Acute ischemic stroke ([place]) 07/09/2014  . Chronic diastolic CHF (congestive heart failure) ([place]) 07/09/2014  . Stroke ([place]) 07/08/2014  . TIA (transient ischemic attack) 07/08/2014  . Chest pain 04/15/2012  . Shortness of breath dyspnea 11/23/2011  . Anxiety 11/23/2011  . Malignant hypertension 11/23/2011  . Elevated troponin 06/20/2011  . Depression   . Hypothyroidism   . Arthritis   . Racing heart beat 02/11/2011  . NSTEMI (non-ST elevated myocardial infarction) ([place]) 09/24/2010  . Idiopathic peripheral neuropathy   . Pollen allergies   . BRONCHIECTASIS 07/29/2008  . Essential hypertension 06/15/2008  . Allergic rhinitis 06/15/2008  . Asthma 06/15/2008  . Cough 06/15/2008  Past Surgical History:  Procedure Laterality Date  . APPENDECTOMY    . CARDIAC CATHETERIZATION  August 2012   Normal coronaries.  Marland Kitchen DILATION AND CURETTAGE OF UTERUS    . TONSILLECTOMY  1930  . TOTAL ABDOMINAL HYSTERECTOMY W/ BILATERAL SALPINGOOPHORECTOMY  1972   leiomyomata, menorrhagia    OB History    Gravida Para Term Preterm AB Living   _0 SAB TAB Ectopic Multiple Live Births                   Home Medications    Prior to Admission medications   Medication Sig Start Date End Date Taking? Authorizing  Provider  ALPRAZolam Duanne Moron) 0.5 MG tablet Take 1 tablet (0.5 mg total) by mouth 2 (two) times daily. 10/08/14   Caren Griffins, MD  atorvastatin (LIPITOR) 10 MG tablet Take 1 tablet (10 mg total) by mouth daily at 6 PM. 07/10/14   Tat, Shanon Brow, MD  azithromycin [facility]) 250 MG tablet Take 2 pills today then one a day for 4 additional days on the 20th of June, August, and October 10/29/16   Collene Gobble, MD  calcium carbonate (TUMS - DOSED IN MG ELEMENTAL CALCIUM) 500 MG chewable tablet Chew 1 tablet by mouth every 8 (eight) hours as needed for indigestion or heartburn.    [provider]  carvedilol (COREG) 6.25 MG tablet Take 1 tablet (6.25 mg total) by mouth 2 (two) times daily. 09/26/16   Nahser, Wonda Cheng, MD  cefdinir (OMNICEF) 300 MG capsule Take 1 capsule (300 mg total) by mouth 2 (two) times daily. 12/10/16   Collene Gobble, MD  clonazePAM (KLONOPIN) 0.5 MG tablet Take 0.25 mg by mouth at bedtime.  04/06/13   [provider]  clopidogrel (PLAVIX) 75 MG tablet Take 1 tablet (75 mg total) by mouth daily. 07/10/14   Orson Eva, MD  doxycycline (VIBRA-TABS) 100 MG tablet Take 1 tablet (100 mg total) by mouth 2 (two) times daily. Take this the 20th of May, July, Sept 10/29/16   Collene Gobble, MD  DULoxetine (CYMBALTA) 20 MG capsule Take 20 mg by mouth 3 (three) times daily. 06/14/14   [provider]  escitalopram (LEXAPRO) 5 MG tablet Take 2.5 mg by mouth daily.    [provider]  gabapentin (NEURONTIN) 100 MG capsule Take 100 mg by mouth daily. 02/05/16   [provider]  hydrochlorothiazide (HYDRODIURIL) 25 MG tablet Take 0.5 tablets (12.5 mg total) by mouth daily. 10/16/16 01/14/17  Nahser, Wonda Cheng, MD  latanoprost (XALATAN) 0.005 % ophthalmic solution Place 1 drop into both eyes at bedtime.  08/30/14   [provider]  levalbuterol Penne Lash HFA) 45 MCG/ACT inhaler Inhale 1-2 puffs into the lungs every 6 (six) hours as needed for wheezing.     [provider]  levalbuterol Penne Lash) 1.25 MG/3ML nebulizer solution Take 1.25 mg by nebulization 2 (two) times daily. At 1pm and 7pm 05/05/15   Collene Gobble, MD  levothyroxine (SYNTHROID, LEVOTHROID) 50 MCG tablet Take 50 mcg by mouth daily.      [provider]  loratadine (CLARITIN) 10 MG tablet Take 10 mg by mouth daily.    [provider]  losartan (COZAAR) 50 MG tablet Take 50 mg by mouth 2 (two) times daily.    [provider]  pantoprazole (PROTONIX) 40 MG tablet Take 1 capsule 1 hour before your first meal of the day 08/08/15  Collene Gobble, MD  predniSONE (DELTASONE) 10 MG tablet Take 1 tablet (10 mg total) by mouth daily with breakfast. 10/29/16   Collene Gobble, MD  Sennosides (SENOKOT PO) Take 1 tablet by mouth every other day.    [provider]  sodium chloride HYPERTONIC 3 % nebulizer solution Take by nebulization 2 (two) times daily. 12/10/16   Collene Gobble, MD  VITAMIN D, CHOLECALCIFEROL, PO Take by mouth.    [provider]    Family History Family History  Problem Relation Age of Onset  . Hypertension Mother        stroke  . Stroke Mother   . Cancer Brother        prostate  . Osteoarthritis Paternal Aunt     Social History Social History   Tobacco Use  . Smoking status: Never Smoker  . Smokeless tobacco: Never Used  Substance Use Topics  . Alcohol use: Yes    Alcohol/week: 1.2 oz    Types: 2 Glasses of wine per week    Comment: wine 1-2 week  . Drug use: No     Allergies   Codeine; Hydrocodone; Isosorbide nitrate; Oxycodone; Clarithromycin; Fiorinal [butalbital-aspirin-caffeine]; Levofloxacin; and Augmentin [amoxicillin-pot clavulanate]   Review of Systems Review of Systems  Constitutional: Negative for fever.  HENT: Negative for sore throat.   Eyes: Negative for redness.  Respiratory: Negative for shortness of breath.   Cardiovascular: Negative for chest pain.  Gastrointestinal:  Positive for diarrhea and vomiting. Negative for abdominal pain.  Genitourinary: Negative for dysuria and flank pain.  Musculoskeletal: Negative for back pain.  Skin: Negative for rash.  Neurological: Negative for headaches.  Hematological: Does not bruise/bleed easily.  Psychiatric/Behavioral: Negative for confusion.     Physical Exam Updated Vital Signs BP (!) 141/81 (BP Location: Right Arm)   Pulse (!) 136   Temp 98.8 F (37.1 C) (Oral)   Resp (!) 22   LMP 01/14/1970   SpO2 92%   Physical Exam  Constitutional: She appears well-developed and well-nourished. No distress.  HENT:  Mouth/Throat: Oropharynx is clear and moist.  Eyes: Conjunctivae are normal. No scleral icterus.  Neck: Neck supple. No tracheal deviation present.  Cardiovascular: Regular rhythm, normal heart sounds and intact distal pulses. Exam reveals no gallop and no friction rub.  No murmur heard. Tachycardic.  Pulmonary/Chest: Effort normal and breath sounds normal. No respiratory distress.  Abdominal: Soft. Normal appearance and bowel sounds are normal. She exhibits no distension. There is no tenderness.  Genitourinary:  Genitourinary Comments: No cva tenderness  Musculoskeletal: She exhibits no edema.  Neurological: She is alert.  Skin: Skin is warm and dry. No rash noted. She is not diaphoretic.  Psychiatric: She has a normal mood and affect.  Nursing note and vitals reviewed.    ED Treatments / Results  Labs (all labs ordered are listed, but only abnormal results are displayed) Results for orders placed or performed during the hospital encounter of 01/25/17  CBC  Result Value Ref Range   WBC 7.0 4.0 - 10.5 K/uL   RBC 4.76 3.87 - 5.11 MIL/uL   Hemoglobin 13.8 12.0 - 15.0 g/dL   HCT 45.4 36.0 - 46.0 %   MCV 95.4 78.0 - 100.0 fL   MCH 29.0 26.0 - 34.0 pg   MCHC 30.4 30.0 - 36.0 g/dL   RDW 13.9 11.5 - 15.5 %   Platelets 362 150 - 400 K/uL  Comprehensive metabolic panel  Result Value Ref Range  Sodium 135 135 - 145 mmol/L   Potassium 5.0 3.5 - 5.1 mmol/L   Chloride 102 101 - 111 mmol/L   CO2 24 22 - 32 mmol/L   Glucose, Bld 116 (H) 65 - 99 mg/dL   BUN 26 (H) 6 - 20 mg/dL   Creatinine, Ser 1.12 (H) 0.44 - 1.00 mg/dL   Calcium 8.0 (L) 8.9 - 10.3 mg/dL   Total Protein 6.6 6.5 - 8.1 g/dL   Albumin 3.3 (L) 3.5 - 5.0 g/dL   AST 40 15 - 41 U/L   ALT 12 (L) 14 - 54 U/L   Alkaline Phosphatase 55 38 - 126 U/L   Total Bilirubin 1.3 (H) 0.3 - 1.2 mg/dL   GFR calc non Af Amer 41 (L) >60 mL/min   GFR calc Af Amer 47 (L) >60 mL/min   Anion gap 9 5 - 15  Urinalysis, Routine w reflex microscopic  Result Value Ref Range   Color, Urine YELLOW YELLOW   APPearance CLEAR CLEAR   Specific Gravity, Urine 1.018 1.005 - 1.030   pH 5.0 5.0 - 8.0   Glucose, UA NEGATIVE NEGATIVE mg/dL   Hgb urine dipstick SMALL (A) NEGATIVE   Bilirubin Urine NEGATIVE NEGATIVE   Ketones, ur NEGATIVE NEGATIVE mg/dL   Protein, ur NEGATIVE NEGATIVE mg/dL   Nitrite NEGATIVE NEGATIVE   Leukocytes, UA MODERATE (A) NEGATIVE   RBC / HPF 0-5 0 - 5 RBC/hpf   WBC, UA 0-5 0 - 5 WBC/hpf   Bacteria, UA RARE (A) NONE SEEN   Squamous Epithelial / LPF 0-5 (A) NONE SEEN   Mucus PRESENT    Dg Abd Acute W/chest  Result Date: 01/25/2017 CLINICAL DATA:  Nausea and vomiting for several hours EXAM: DG ABDOMEN ACUTE W/ 1V CHEST COMPARISON:  12/10/2016, 12/01/2014 FINDINGS: Cardiac shadow is mildly enlarged. Hiatal hernia is again seen. Stable nodular density is noted in the right upper lobe unchanged from the previous exam as well as previous exams. No focal infiltrate or sizable effusion is seen. Scoliosis concave to the left is noted within the lower thoracic spine. No free air is identified. Scattered large and small bowel gas is seen. Scoliosis of the lumbar spine is noted. No other focal abnormality is seen. IMPRESSION: Chronic changes without acute abnormality. Electronically Signed   By: Inez Catalina M.D.   On: 01/25/2017 19:08     EKG  EKG Interpretation  Date/Time:  Saturday January 25 2017 20:19:40 EST Ventricular Rate:  142 PR Interval:    QRS Duration: 75 QT Interval:  269 QTC Calculation: 414 R Axis:   -50 Text Interpretation:  Sinus tachycardia Confirmed by Lajean Saver [phone]) on 01/25/2017 8:33:08 PM       Radiology Dg Abd Acute W/chest  Result Date: 01/25/2017 CLINICAL DATA:  Nausea and vomiting for several hours EXAM: DG ABDOMEN ACUTE W/ 1V CHEST COMPARISON:  12/10/2016, 12/01/2014 FINDINGS: Cardiac shadow is mildly enlarged. Hiatal hernia is again seen. Stable nodular density is noted in the right upper lobe unchanged from the previous exam as well as previous exams. No focal infiltrate or sizable effusion is seen. Scoliosis concave to the left is noted within the lower thoracic spine. No free air is identified. Scattered large and small bowel gas is seen. Scoliosis of the lumbar spine is noted. No other focal abnormality is seen. IMPRESSION: Chronic changes without acute abnormality. Electronically Signed   By: Inez Catalina M.D.   On: 01/25/2017 19:08    Procedures Procedures (including critical care  time)  Medications Ordered in ED Medications  sodium chloride 0.9 % bolus 1,000 mL (not administered)  ondansetron (ZOFRAN) injection 4 mg (4 mg Intravenous Given 01/25/17 1623)     Initial Impression / Assessment and Plan / ED Course  I have reviewed the triage vital signs and the nursing notes.  Pertinent labs & imaging results that were available during my care of the patient were reviewed by me and considered in my medical decision making (see chart for details).  Iv ns bolus. zofran iv.  Reviewed nursing notes and prior charts for additional history.   Labs sent.   Additional iv ns bolus.   Pt remains weak, nauseated. Upon getting up/standing to bathroom, hr increases to 140's, sinus.   Recheck abd soft nt. No sbo on imaging.  Given persistent symptoms, weakness, tachycardia,  dehydration, mild kidney injury on labs - will admit.   Final Clinical Impressions(s) / ED Diagnoses   Final diagnoses:  None    ED Discharge Orders    None       Lajean Saver, MD 01/25/17 2037

## 2017-01-25 NOTE — ED Notes (Signed)
Bed: WA01 Expected date:  Expected time:  Means of arrival:  Comments: EMS-N/V/D 

## 2017-01-26 ENCOUNTER — Inpatient Hospital Stay (HOSPITAL_COMMUNITY): Payer: Medicare Other

## 2017-01-26 ENCOUNTER — Other Ambulatory Visit: Payer: Self-pay

## 2017-01-26 ENCOUNTER — Encounter (HOSPITAL_COMMUNITY): Payer: Self-pay | Admitting: Radiology

## 2017-01-26 DIAGNOSIS — I361 Nonrheumatic tricuspid (valve) insufficiency: Secondary | ICD-10-CM

## 2017-01-26 LAB — ECHOCARDIOGRAM COMPLETE
AOASC: 30 cm
CHL CUP REG VEL DIAS: 101 cm/s
CHL CUP TV REG PEAK VELOCITY: 251 cm/s
E decel time: 250 msec
EERAT: 9.29
FS: 41 % (ref 28–44)
HEIGHTINCHES: 63 in
IVS/LV PW RATIO, ED: 1.05
LA ID, A-P, ES: 30 mm
LA diam end sys: 30 mm
LA diam index: 1.67 cm/m2
LA vol A4C: 46.6 ml
LA vol: 30.9 mL
LAVOLIN: 17.2 mL/m2
LV E/e'average: 9.29
LVEEMED: 9.29
LVELAT: 7.05 cm/s
LVOT area: 2.54 cm2
LVOT diameter: 18 mm
MV Dec: 250
MVPKAVEL: 89.2 m/s
MVPKEVEL: 65.5 m/s
PW: 11 mm — AB (ref 0.6–1.1)
TAPSE: 16.1 mm
TDI e' lateral: 7.05
TDI e' medial: 4.28
TRMAXVEL: 251 cm/s
Weight: 2511.48 oz

## 2017-01-26 LAB — CBC
HCT: 40.3 % (ref 36.0–46.0)
Hemoglobin: 12.6 g/dL (ref 12.0–15.0)
MCH: 29.4 pg (ref 26.0–34.0)
MCHC: 31.3 g/dL (ref 30.0–36.0)
MCV: 94.2 fL (ref 78.0–100.0)
Platelets: 270 10*3/uL (ref 150–400)
RBC: 4.28 MIL/uL (ref 3.87–5.11)
RDW: 13.9 % (ref 11.5–15.5)
WBC: 6.8 10*3/uL (ref 4.0–10.5)

## 2017-01-26 LAB — COMPREHENSIVE METABOLIC PANEL
ALT: 10 U/L — AB (ref 14–54)
AST: 23 U/L (ref 15–41)
Albumin: 2.6 g/dL — ABNORMAL LOW (ref 3.5–5.0)
Alkaline Phosphatase: 36 U/L — ABNORMAL LOW (ref 38–126)
Anion gap: 7 (ref 5–15)
BILIRUBIN TOTAL: 0.8 mg/dL (ref 0.3–1.2)
BUN: 22 mg/dL — ABNORMAL HIGH (ref 6–20)
CALCIUM: 7.2 mg/dL — AB (ref 8.9–10.3)
CO2: 22 mmol/L (ref 22–32)
CREATININE: 0.99 mg/dL (ref 0.44–1.00)
Chloride: 104 mmol/L (ref 101–111)
GFR, EST AFRICAN AMERICAN: 55 mL/min — AB (ref >60)
GFR, EST NON AFRICAN AMERICAN: 47 mL/min — AB (ref >60)
Glucose, Bld: 92 mg/dL (ref 65–99)
Potassium: 3.9 mmol/L (ref 3.5–5.1)
Sodium: 133 mmol/L — ABNORMAL LOW (ref 135–145)
TOTAL PROTEIN: 5.1 g/dL — AB (ref 6.5–8.1)

## 2017-01-26 LAB — D-DIMER, QUANTITATIVE: D-Dimer, Quant: 2.04 ug/mL-FEU — ABNORMAL HIGH (ref 0.00–0.50)

## 2017-01-26 LAB — C DIFFICILE QUICK SCREEN W PCR REFLEX
C Diff antigen: NEGATIVE
C Diff interpretation: NOT DETECTED
C Diff toxin: NEGATIVE

## 2017-01-26 LAB — TROPONIN I
TROPONIN I: 0.05 ng/mL — AB (ref <0.03)
Troponin I: 0.06 ng/mL (ref <0.03)
Troponin I: 0.05 ng/mL (ref <0.03)

## 2017-01-26 LAB — TSH: TSH: 0.522 u[IU]/mL (ref 0.350–4.500)

## 2017-01-26 MED ORDER — ALPRAZOLAM 0.5 MG PO TABS
0.5000 mg | ORAL_TABLET | Freq: Two times a day (BID) | ORAL | Status: DC
  Administered 2017-01-26 – 2017-01-29 (×7): 0.5 mg via ORAL
  Filled 2017-01-26 (×7): qty 1

## 2017-01-26 MED ORDER — SODIUM CHLORIDE 0.9 % IV SOLN
INTRAVENOUS | Status: DC
  Administered 2017-01-27 (×2): via INTRAVENOUS

## 2017-01-26 MED ORDER — SACCHAROMYCES BOULARDII 250 MG PO CAPS
250.0000 mg | ORAL_CAPSULE | Freq: Two times a day (BID) | ORAL | Status: DC
  Administered 2017-01-26 – 2017-01-29 (×7): 250 mg via ORAL
  Filled 2017-01-26 (×7): qty 1

## 2017-01-26 MED ORDER — IOPAMIDOL (ISOVUE-370) INJECTION 76%
80.0000 mL | Freq: Once | INTRAVENOUS | Status: AC | PRN
  Administered 2017-01-26: 80 mL via INTRAVENOUS

## 2017-01-26 MED ORDER — LEVALBUTEROL HCL 1.25 MG/0.5ML IN NEBU
1.2500 mg | INHALATION_SOLUTION | Freq: Three times a day (TID) | RESPIRATORY_TRACT | Status: DC
  Administered 2017-01-26 – 2017-01-27 (×3): 1.25 mg via RESPIRATORY_TRACT
  Filled 2017-01-26 (×3): qty 0.5

## 2017-01-26 MED ORDER — IOPAMIDOL (ISOVUE-370) INJECTION 76%
INTRAVENOUS | Status: AC
  Filled 2017-01-26: qty 100

## 2017-01-26 NOTE — Progress Notes (Signed)
  Echocardiogram 2D Echocardiogram has been performed.  [name] 01/26/2017, 12:25 PM

## 2017-01-26 NOTE — Progress Notes (Signed)
PROGRESS NOTE  Sarah [name]  UEA:[id number] DOB: 1921/08/12 DOA: 01/25/2017 PCP: [name], MD  Outpatient Specialists: Pulmonology, [place]; Cardiology, [place] Brief Narrative: Sarah [name] is a 84 y.o. female with a history of obstructive lung disease, bronchiectasis on chronic steroids and cycling antibiotics, NSTEMI with nonobstructive CAD, GERD with hiatal hernia, and hypothyroidism who was brought to the ED from ILF by her daughter for progressive weakness in the setting of diarrhea, nausea and vomiting over the past few days. Sinus tachycardia was noted on arrival, initial troponin negative, d-dimer checked, found to be positive, subsequent CTA chest showed RLL nodularity, fibrosis, LLL bronchiectatic changes, and a moderate hiatal hernia. CT head showed only chronic small vessel ischemic changes and atrophy. SCr slightly above baseline at 1.12. WBC normal at 7. Troponins have remained low with flat trend 0.05 > 0.06 > 0.05. CDiff has returned negative. UA without pyuria. She was given IV fluids and admitted.   Assessment & Plan: Active Problems:   Nausea & vomiting  N/V/D: Stool is watery. Pt is at very elevated risk of complications due to age (turned 84yo last week). - Follow up GI pathogen panel; CDiff is negative. Continue empiric precautions. - Probiotic (on chronic antibiotics) - Continue IVF's as we liberalize diet.  - Prn zofran  Weakness: Generalized, previously ambulated independently with cane. Thought to be due to dehydration, diarrhea, etc. as above. CT head without infarct, no focal deficits. - PT/OT to be ordered once pt able to participate.   Acute hypoxic respiratory failure: CTA chest without PE. Traction bronchiectasis in left lung noted, and nodularity at right base also noted. No fever, leukocytosis, or increase in cough.  - Will discuss with pt's pulmonologist 1/14.  - Continue prednisone, no stress dose for now - Bronchodilators, respiratory therapy  consulted. Will use xopenex with ongoing tachycardia.  - Supplemental oxygen prn.   Nonobstructive CAD, HTN: No chest pain, troponins flat at 0.05 > 0.06 > 0.05, no ischemic ECG findings. No wall motion abnormalities on echocardiogram. TSH 0.522.  - Monitor on home medications including coreg, losartan, HCTZ, plavix  Anxiety: Chronic, exacerbated by hospitalization.  - On xanax BID and clonazepam qHS in addition to duloxetine and lexapro which will all be continued. Discussed very elevated risk of falling and worsening delirium with benzodiazepines.   Hypothyroidism: TSH 0.522.  - Continue synthroid  GERD: Chronic, stable.  - PPI  Glaucoma:  - Continue home gtt's  Nonruptured cerebral aneurysm: Has seen neurology as an outpatient, CT here showed stability.  - Reassurance provided.   DVT prophylaxis: Lovenox Code Status: DNR Family Communication: Daughter at bedside Disposition Plan: Uncertain  Consultants:   None  Procedures:  Echocardiogram 01/26/2017:  - Left ventricle: The cavity size was normal. Wall thickness was   increased in a pattern of mild LVH. There was mild focal basal   hypertrophy of the septum. Systolic function was vigorous. The   estimated ejection fraction was in the range of 65% to 70%. Wall   motion was normal; there were no regional wall motion   abnormalities. Doppler parameters are consistent with abnormal   left ventricular relaxation (grade 1 diastolic dysfunction). - Mitral valve: Calcified annulus. - Left atrium: The atrium was mildly dilated.  Impressions: - Vigorous LV systolic function; mild LVH with proximal septal   thickening; mild diastolic dysfunction; mild LAE; mild TR.  Antimicrobials:  None   Subjective: Very tired/weak "I've never been this sick before." Daughter confirms above history, says the patient won't eat food  from restricted diet. Loose stools continue. No emesis.   Objective: Vitals:   01/26/17 0050 01/26/17 0500  01/26/17 0831 01/26/17 1146  BP: 131/61 (!) 138/55    Pulse: (!) 126 100 (!) 111   Resp: 16 16 (!) 26   Temp: (!) 97.5 F (36.4 C) 97.6 F (36.4 C)    TempSrc: Oral Oral    SpO2: 99% 91% (!) 89% 93%  Weight:  71.2 kg (156 lb 15.5 oz)    Height:        Intake/Output Summary (Last 24 hours) at 01/26/2017 1304 Last data filed at 01/26/2017 1000 Gross per 24 hour  Intake 2040 ml  Output 120 ml  Net 1920 ml   Filed Weights   01/25/17 2336 01/26/17 0500  Weight: 68.1 kg (150 lb 2.1 oz) 71.2 kg (156 lb 15.5 oz)    Gen: Weak-appearing elderly female in no distress Pulm: Non-labored tachypnea, decreased diffusely, worst at bases.  CV: Regular tachycardia. No murmur, rub, or gallop. No JVD, no pedal edema. GI: Abdomen soft, not appreciably tender, non-distended, with normoactive bowel sounds. No organomegaly or masses felt. Incontinence of liquid brown stool. Ext: Warm, no deformities Neuro: Alert and oriented. No focal neurological deficits. Psych: Judgement and insight appear normal. Mood & affect appropriate.   Data Reviewed: I have personally reviewed following labs and imaging studies  CBC: Recent Labs  Lab 01/25/17 1643 01/26/17 0647  WBC 7.0 6.8  HGB 13.8 12.6  HCT 45.4 40.3  MCV 95.4 94.2  PLT 362 270   Basic Metabolic Panel: Recent Labs  Lab 01/25/17 1643 01/26/17 0647  NA 135 133*  K 5.0 3.9  CL 102 104  CO2 24 22  GLUCOSE 116* 92  BUN 26* 22*  CREATININE 1.12* 0.99  CALCIUM 8.0* 7.2*   GFR: Estimated Creatinine Clearance: 32.9 mL/min (by C-G formula based on SCr of 0.99 mg/dL). Liver Function Tests: Recent Labs  Lab 01/25/17 1643 01/25/17 2028 01/26/17 0647  AST 40 40 23  ALT 12* 11* 10*  ALKPHOS 55 54 36*  BILITOT 1.3* 1.4* 0.8  PROT 6.6 6.7 5.1*  ALBUMIN 3.3* 3.4* 2.6*   Recent Labs  Lab 01/25/17 2028  LIPASE 18   No results for input(s): AMMONIA in the last 168 hours. Coagulation Profile: No results for input(s): INR, PROTIME in the  last 168 hours. Cardiac Enzymes: Recent Labs  Lab 01/25/17 2351 01/26/17 0647 01/26/17 1150  TROPONINI 0.05* 0.06* 0.05*   BNP (last 3 results) No results for input(s): PROBNP in the last 8760 hours. HbA1C: No results for input(s): HGBA1C in the last 72 hours. CBG: No results for input(s): GLUCAP in the last 168 hours. Lipid Profile: No results for input(s): CHOL, HDL, LDLCALC, TRIG, CHOLHDL, LDLDIRECT in the last 72 hours. Thyroid Function Tests: Recent Labs    01/26/17 0647  TSH 0.522   Anemia Panel: No results for input(s): VITAMINB12, FOLATE, FERRITIN, TIBC, IRON, RETICCTPCT in the last 72 hours. Urine analysis:    Component Value Date/Time   COLORURINE YELLOW 01/25/2017 1832   APPEARANCEUR CLEAR 01/25/2017 1832   LABSPEC 1.018 01/25/2017 1832   PHURINE 5.0 01/25/2017 1832   GLUCOSEU NEGATIVE 01/25/2017 1832   HGBUR SMALL (A) 01/25/2017 1832   BILIRUBINUR NEGATIVE 01/25/2017 1832   KETONESUR NEGATIVE 01/25/2017 1832   PROTEINUR NEGATIVE 01/25/2017 1832   UROBILINOGEN 1.0 10/06/2014 1730   NITRITE NEGATIVE 01/25/2017 1832   LEUKOCYTESUR MODERATE (A) 01/25/2017 1832   Recent Results (from the past 240 hour(s))  C difficile quick scan w PCR reflex     Status: None   Collection Time: 01/25/17  9:44 PM  Result Value Ref Range Status   C Diff antigen NEGATIVE NEGATIVE Final   C Diff toxin NEGATIVE NEGATIVE Final   C Diff interpretation No C. difficile detected.  Final      Radiology Studies: Ct Head Wo Contrast  Result Date: 01/25/2017 CLINICAL DATA:  Ataxia and weakness EXAM: CT HEAD WITHOUT CONTRAST TECHNIQUE: Contiguous axial images were obtained from the base of the skull through the vertex without intravenous contrast. COMPARISON:  Head CT 10/06/2014, brain MRI 07/08/2014 FINDINGS: Brain: No acute territorial infarction, hemorrhage, or intracranial mass is visualized. Old left cerebellar infarct. Mild small vessel ischemic changes of the white matter. Mild  atrophy. Stable ventricle size. Vascular: No hyperdense vessels. Stable asymmetric soft tissue density at the right cavernous sinus, felt to correspond to the previously noted aneurysm on MRA. Scattered calcifications at the carotid siphons. Skull: No fracture.  Mastoid air cells are clear. Sinuses/Orbits: Fluid levels in the maxillary sinuses with moderate left maxillary sinus mucosal thickening. Mild mucosal thickening in the ethmoid sinuses. No acute orbital abnormality. Other: None IMPRESSION: 1. No CT evidence for acute intracranial abnormality. Atrophy and small vessel ischemic changes. 2. Bilateral maxillary sinusitis 3. Stable asymmetric soft tissue density at the right cavernous sinus, felt to correspond to previously noted aneurysm on MRA from 2,016. Electronically Signed   By: Jasmine Pang M.D.   On: 01/25/2017 23:33   Ct Angio Chest Pe W Or Wo Contrast  Result Date: 01/26/2017 CLINICAL DATA:  84 year old female with concern for pulmonary embolism. EXAM: CT ANGIOGRAPHY CHEST WITH CONTRAST TECHNIQUE: Multidetector CT imaging of the chest was performed using the standard protocol during bolus administration of intravenous contrast. Multiplanar CT image reconstructions and MIPs were obtained to evaluate the vascular anatomy. CONTRAST:  80mL ISOVUE-370 IOPAMIDOL (ISOVUE-370) INJECTION 76% COMPARISON:  Chest radiograph dated 01/25/2017 FINDINGS: Cardiovascular: There is no cardiomegaly or pericardial effusion. The thoracic aorta is unremarkable. The origins of the great vessels of the aortic arch are patent. No CT evidence of pulmonary embolism. Mediastinum/Nodes: No hilar adenopathy. Mediastinal lymph node measures 12 mm in short axis anterior to the carina. There is a moderate size hiatal hernia containing portion of the stomach. The esophagus is grossly unremarkable. No mediastinal fluid collection. Lungs/Pleura: Chronic parenchymal scarring and traction bronchiectasis in the left lower lobe and  lingula. There is associated decreased volume in the left lower lobe. Cluster of nodularity at the right lung base (series 6, image 86) likely chronic. An infectious process is not excluded. Clinical correlation recommended. An area of scarring noted in the right upper lobe along the minor fissure. There is no pleural effusion or pneumothorax. The central airways are patent. Upper Abdomen: No acute abnormality. Musculoskeletal: Degenerative changes of the spine. No acute osseous pathology. Review of the MIP images confirms the above findings. IMPRESSION: 1. No CT evidence of pulmonary embolism. 2. Chronic fibrosis and bronchiectatic changes in the left lower lobe and lingula. 3. Cluster of nodularity in the right lower lobe may be chronic or represent an infectious process. Clinical correlation is recommended. 4. Moderate size hiatal hernia. Electronically Signed   By: Elgie Collard M.D.   On: 01/26/2017 04:42   Dg Abd Acute W/chest  Result Date: 01/25/2017 CLINICAL DATA:  Nausea and vomiting for several hours EXAM: DG ABDOMEN ACUTE W/ 1V CHEST COMPARISON:  12/10/2016, 12/01/2014 FINDINGS: Cardiac shadow is mildly enlarged. Hiatal  hernia is again seen. Stable nodular density is noted in the right upper lobe unchanged from the previous exam as well as previous exams. No focal infiltrate or sizable effusion is seen. Scoliosis concave to the left is noted within the lower thoracic spine. No free air is identified. Scattered large and small bowel gas is seen. Scoliosis of the lumbar spine is noted. No other focal abnormality is seen. IMPRESSION: Chronic changes without acute abnormality. Electronically Signed   By: Alcide Clever M.D.   On: 01/25/2017 19:08    Scheduled Meds: . ALPRAZolam  0.5 mg Oral BID  . atorvastatin  10 mg Oral q1800  . carvedilol  6.25 mg Oral BID WC  . clonazePAM  0.25 mg Oral QHS  . clopidogrel  75 mg Oral Daily  . enoxaparin (LOVENOX) injection  30 mg Subcutaneous QHS  .  escitalopram  2.5 mg Oral Daily  . gabapentin  100 mg Oral Daily  . hydrochlorothiazide  12.5 mg Oral Daily  . iopamidol      . latanoprost  1 drop Both Eyes QHS  . levalbuterol  1.25 mg Nebulization TID  . levothyroxine  50 mcg Oral QAC breakfast  . loratadine  10 mg Oral Daily  . losartan  50 mg Oral BID  . pantoprazole  40 mg Oral QAC breakfast  . predniSONE  10 mg Oral Q breakfast  . sodium chloride HYPERTONIC  4 mL Nebulization BID   Continuous Infusions: . sodium chloride 75 mL/hr at 01/26/17 1247     LOS: 1 day   Time spent: 25 minutes.  Hazeline Junker, MD [facility] Pager [phone]  If 7PM-7AM, please contact night-coverage [email or web address] Password [email or web address] 01/26/2017, 1:04 PM

## 2017-01-26 NOTE — Progress Notes (Signed)
Patient down for CT scan of chest. Will c/t monitor.

## 2017-01-26 NOTE — Progress Notes (Signed)
Patient returned from CT. Contacted lab and requested they come back to do labs now that patient is back in room. Will c/t monitor.

## 2017-01-26 NOTE — Progress Notes (Signed)
Orders placed by Dr. Selena BattenKim for CT chest. Will ct monitor

## 2017-01-26 NOTE — Progress Notes (Signed)
CRITICAL VALUE ALERT  Critical Value:  Troponin 0.05  Date & Time Notied:  01/27/16 0108  Provider Notified: Dr. Selena BattenKim  Orders Received/Actions taken: awaiting cb

## 2017-01-26 NOTE — Progress Notes (Signed)
Notified by CT that they cannot complete CT scan because patient needs 20g. IV was attempted x2, unable to get so IV team consult placed. Will c/t monitor.

## 2017-01-26 NOTE — Progress Notes (Addendum)
Patient admitted to room 1502 from ED. Accompanied by daughter. [facility], lives alone and is independent with cane/walker. Patient is A&Ox4. Patient has slightly slurred speech from old CVA. Patient has SL to left arm. Has right lower leg bruise and left lower leg skin tear that is 5cmx3.5cm. Site is red, open and with scant bloody drainage. Daughter states they have been cleaning daily and applying tegaderm type dressing. Cleaned site on arrival because dressing was coming off and applied new foam. Patient skin otherwise intact. Patient is HOH. Patient appears weak and states she is very tired because she has been up all day with no rest. Patient oriented to room, call bell, bed use.Will c/t monitor.

## 2017-01-26 NOTE — Progress Notes (Signed)
No response from provider on call NP [place], second page sent this time to Dr. Selena BattenKim. Advised that DDimer was elevated and need clarification if Chest CT should be ordered because previous order for this at 2052 expired and next steps/orders regarding DDimer. Also notified that lab just called for Trop level 0.05. Vitals are 131/61, 16, 124, 99% RA. Patient is resting and states she is exhausted. Denies chest pain at this time. Will c/t monitor/awaiting MD callback.

## 2017-01-26 NOTE — Progress Notes (Signed)
Patient transported down to CT for head CT. Lab was in room to draw labs but states they will come back to draw once patient is back from CT. Will c/t monitor.

## 2017-01-26 NOTE — Progress Notes (Signed)
Patient DDimer resulted and is 2.04. Patient Heart Rate sustaining in 120s. Sat 93%. Paged provider on call and awaiting callback. Will c/t monitor.

## 2017-01-26 NOTE — Progress Notes (Signed)
Oxygen 89% on room air. Placed patient on 2 liter of oxygen sats 93%.  Dr. Jarvis NewcomerGrunz notified via text page.

## 2017-01-26 NOTE — Progress Notes (Signed)
Patient placed on 1 L O2 per O2 sat of 89% on RA. Patient O2 increased to 93% on 1 L.

## 2017-01-26 NOTE — Progress Notes (Signed)
Daughter concerned because xanax not ordered for patient.  Dr. Jarvis NewcomerGrunz notified via text page.

## 2017-01-27 LAB — GASTROINTESTINAL PANEL BY PCR, STOOL (REPLACES STOOL CULTURE)
Adenovirus F40/41: NOT DETECTED
Astrovirus: NOT DETECTED
CAMPYLOBACTER SPECIES: NOT DETECTED
CRYPTOSPORIDIUM: NOT DETECTED
Cyclospora cayetanensis: NOT DETECTED
ENTEROPATHOGENIC E COLI (EPEC): NOT DETECTED
Entamoeba histolytica: NOT DETECTED
Enteroaggregative E coli (EAEC): NOT DETECTED
Enterotoxigenic E coli (ETEC): NOT DETECTED
Giardia lamblia: NOT DETECTED
NOROVIRUS GI/GII: DETECTED — AB
PLESIMONAS SHIGELLOIDES: NOT DETECTED
Rotavirus A: NOT DETECTED
SALMONELLA SPECIES: NOT DETECTED
SHIGELLA/ENTEROINVASIVE E COLI (EIEC): NOT DETECTED
Sapovirus (I, II, IV, and V): NOT DETECTED
Shiga like toxin producing E coli (STEC): NOT DETECTED
Vibrio cholerae: NOT DETECTED
Vibrio species: NOT DETECTED
Yersinia enterocolitica: NOT DETECTED

## 2017-01-27 MED ORDER — DULOXETINE HCL 20 MG PO CPEP
20.0000 mg | ORAL_CAPSULE | Freq: Three times a day (TID) | ORAL | Status: DC
  Administered 2017-01-27 – 2017-01-29 (×6): 20 mg via ORAL
  Filled 2017-01-27 (×7): qty 1

## 2017-01-27 MED ORDER — LEVALBUTEROL HCL 1.25 MG/0.5ML IN NEBU
1.2500 mg | INHALATION_SOLUTION | Freq: Two times a day (BID) | RESPIRATORY_TRACT | Status: DC
  Administered 2017-01-27 – 2017-01-28 (×2): 1.25 mg via RESPIRATORY_TRACT
  Filled 2017-01-27 (×2): qty 0.5

## 2017-01-27 NOTE — Evaluation (Signed)
Physical Therapy Evaluation Patient Details Name: Sarah FlavorsGertrude Kolbeck MRN: [id number] DOB: [birth year] Today's Date: 01/27/2017   History of Present Illness  84 y.o. female with a history of obstructive lung disease, bronchiectasis on chronic steroids and cycling antibiotics, NSTEMI with nonobstructive CAD, GERD with hiatal hernia, and hypothyroidism who was brought to the ED from ILF by her daughter for progressive weakness in the setting of diarrhea, nausea and vomiting   Clinical Impression  Pt admitted with above diagnosis. Pt currently with functional limitations due to the deficits listed below (see PT Problem List).  Pt amb with cane at baseline in her IND-living apt at [place]; she is deconditioned/weaker than her baseline, and requiring RW and min to min/guard assist for safe amb  ~70'; SpO2=94% on RA, HR 90s; pt is extremely dyspneic after amb;  Pt will benefit from STSNF vs HHPT at her IND-living apt, depdning on progress and length of stay;   Pt will benefit from skilled PT to increase their independence and safety with mobility to allow discharge to the venue listed below.  Will follow in acute setting    Follow Up Recommendations SNF(vs HHPT at IND-living)    Equipment Recommendations  None recommended by PT    Recommendations for Other Services       Precautions / Restrictions Precautions Precautions: Fall Restrictions Weight Bearing Restrictions: No      Mobility  Bed Mobility Overal bed mobility: Needs Assistance Bed Mobility: Supine to Sit     Supine to sit: Supervision     General bed mobility comments: incr time, cupervision for safety  Transfers Overall transfer level: Needs assistance Equipment used: Rolling walker (2 wheeled) Transfers: Sit to/from Stand Sit to Stand: Min guard;Min assist         General transfer comment: assist to rise, steady and transition to RW; cues for hand placement  Ambulation/Gait Ambulation/Gait assistance: Min  guard;Min assist Ambulation Distance (Feet): 70 Feet Assistive device: Rolling walker (2 wheeled) Gait Pattern/deviations: Step-through pattern;Decreased stride length;Trunk flexed     General Gait Details: assist with RW position and balance while amb over uneven area (expansion joint); modest instability noted intially, improved with distance  Stairs            Wheelchair Mobility    Modified Rankin (Stroke Patients Only)       Balance Overall balance assessment: Needs assistance Sitting-balance support: No upper extremity supported;Feet supported Sitting balance-Leahy Scale: Fair       Standing balance-Leahy Scale: Poor Standing balance comment: reliant on UEs for safe standing balance                             Pertinent Vitals/Pain Pain Assessment: 0-10 Pain Score: 3  Pain Location: head Pain Descriptors / Indicators: Aching Pain Intervention(s): Premedicated before session;Monitored during session    Home Living Family/patient expects to be discharged to:: Private residence(independent living) Living Arrangements: Alone   Type of Home: Apartment       Home Layout: One level Home Equipment: Walker - 4 wheels;Cane - single point      Prior Function Level of Independence: Independent with assistive device(s);Needs assistance   Gait / Transfers Assistance Needed: uses rollator to walk to DR; otherwise amb with cane  ADL's / Homemaking Assistance Needed: dtr hired her assist with showers--pt states she doesn't need it;   Comments: caregiver comes on Tuesdays, helps with errands  or anything that needs to be done--laundry, houskeeping, etc  Hand Dominance        Extremity/Trunk Assessment   Upper Extremity Assessment Upper Extremity Assessment: Generalized weakness    Lower Extremity Assessment Lower Extremity Assessment: Generalized weakness       Communication   Communication: No difficulties  Cognition Arousal/Alertness:  Awake/alert Behavior During Therapy: WFL for tasks assessed/performed Overall Cognitive Status: Within Functional Limits for tasks assessed                                        General Comments      Exercises     Assessment/Plan    PT Assessment Patient needs continued PT services  PT Problem List Decreased strength;Decreased activity tolerance;Decreased balance;Decreased mobility;Decreased knowledge of use of DME       PT Treatment Interventions DME instruction;Gait training;Functional mobility training;Therapeutic exercise;Therapeutic activities;Balance training;Patient/family education    PT Goals (Current goals can be found in the Care Plan section)  Acute Rehab PT Goals Patient Stated Goal: to get better, doesn't mind going to rehab if needed PT Goal Formulation: With patient Potential to Achieve Goals: Good    Frequency Min 3X/week   Barriers to discharge        Co-evaluation               AM-PAC PT "6 Clicks" Daily Activity  Outcome Measure Difficulty turning over in bed (including adjusting bedclothes, sheets and blankets)?: A Little Difficulty moving from lying on back to sitting on the side of the bed? : A Little Difficulty sitting down on and standing up from a chair with arms (e.g., wheelchair, bedside commode, etc,.)?: Unable Help needed moving to and from a bed to chair (including a wheelchair)?: A Little Help needed walking in hospital room?: A Little Help needed climbing 3-5 steps with a railing? : A Lot 6 Click Score: 15    End of Session Equipment Utilized During Treatment: Gait belt Activity Tolerance: Patient tolerated treatment well Patient left: in chair;with call bell/phone within reach(no chair alarm pads available on unit )   PT Visit Diagnosis: Difficulty in walking, not elsewhere classified (R26.2);Muscle weakness (generalized) (M62.81)    Time: 4098-1191 PT Time Calculation (min) (ACUTE ONLY): 30  min   Charges:   PT Evaluation $PT Eval Low Complexity: 1 Low PT Treatments $Gait Training: 8-22 mins   PT G CodesDrucilla Chalet, PT Pager: [phone] 01/27/2017   Drucilla Chalet 01/27/2017, 1:10 PM

## 2017-01-27 NOTE — Progress Notes (Signed)
PROGRESS NOTE  Sarah [name]  GNF:[id number] DOB: 30-Jun-1921 DOA: 01/25/2017 PCP: [name], [name], MD  Outpatient Specialists: Pulmonology, [place]; Cardiology, Nahser Brief Narrative: Sarah [name] is a 84 y.o. female with a history of obstructive lung disease, bronchiectasis on chronic steroids and cycling antibiotics, NSTEMI with nonobstructive CAD, GERD with hiatal hernia, and hypothyroidism who was brought to the ED from ILF by her daughter for progressive weakness in the setting of diarrhea, nausea and vomiting over the past few days. Sinus tachycardia was noted on arrival, initial troponin negative, d-dimer checked, found to be positive, subsequent CTA chest showed RLL nodularity, fibrosis, LLL bronchiectatic changes, and a moderate hiatal hernia. CT head showed only chronic small vessel ischemic changes and atrophy. SCr slightly above baseline at 1.12. WBC normal at 7. Troponins have remained low with flat trend 0.05 > 0.06 > 0.05. CDiff has returned negative. UA without pyuria. She was given IV fluids and admitted.   Assessment & Plan: Active Problems:   Nausea & vomiting  Norovirus enteritis: Pt is at very elevated risk of complications due to age (turned 84yo last week). - Strict contact precautions. Staff and family members have developed symptoms.  - Probiotic (on chronic antibiotics) - Continue IVF's to replace diarrhea losses, continue to push po  - Prn zofran  Weakness: Generalized, previously ambulated independently with cane. Thought to be due to dehydration, diarrhea, etc. as above. CT head without infarct, no focal deficits. - PT ordered.  Acute hypoxic respiratory failure: Resolved without targeted intervention. CTA chest without PE. Traction bronchiectasis in left lung noted, and nodularity at right base also noted. No fever, leukocytosis, or increase in cough.  - Continue prednisone, no stress dose for now - Bronchodilators, respiratory therapy consulted. Will use xopenex  with ongoing tachycardia.   Nonobstructive CAD, HTN: No chest pain, troponins flat at 0.05 > 0.06 > 0.05, no ischemic ECG findings. No wall motion abnormalities on echocardiogram. TSH 0.522.  - Monitor on home medications including coreg, losartan, HCTZ, plavix  Anxiety: Chronic, exacerbated by hospitalization.  - On xanax BID and clonazepam qHS in addition to duloxetine and lexapro which will all be continued. Discussed very elevated risk of falling and worsening delirium with benzodiazepines.   Hypothyroidism: TSH 0.522.  - Continue synthroid  GERD: Chronic, stable.  - PPI  Glaucoma:  - Continue home gtt's  Nonruptured cerebral aneurysm: Has seen neurology as an outpatient, CT here showed stability.  - Reassurance provided.   DVT prophylaxis: Lovenox Code Status: DNR Family Communication: Daughter at bedside Disposition Plan: Return to ILF w/HH-PT vs. SNF depending on improvement in weakness. If clinical trajectory continues, should be stable for DC in next 24 hours.   Consultants:   None  Procedures:  Echocardiogram 01/26/2017:  - Left ventricle: The cavity size was normal. Wall thickness was   increased in a pattern of mild LVH. There was mild focal basal   hypertrophy of the septum. Systolic function was vigorous. The   estimated ejection fraction was in the range of 65% to 70%. Wall   motion was normal; there were no regional wall motion   abnormalities. Doppler parameters are consistent with abnormal   left ventricular relaxation (grade 1 diastolic dysfunction). - Mitral valve: Calcified annulus. - Left atrium: The atrium was mildly dilated.  Impressions: - Vigorous LV systolic function; mild LVH with proximal septal   thickening; mild diastolic dysfunction; mild LAE; mild TR.  Antimicrobials:  None   Subjective: Still weak, but improved from yesterday. Taking po better.  Had diarrhea overnight, but overall decreasing.  Objective: Vitals:   01/26/17 2046  01/27/17 0500 01/27/17 0554 01/27/17 0913  BP: (!) 114/58  104/64   Pulse: 84  77   Resp: 20  18   Temp: 98.4 F (36.9 C)  98.8 F (37.1 C)   TempSrc: Oral  Oral   SpO2: 100%  100% 100%  Weight:  72.1 kg (158 lb 15.2 oz)    Height:        Intake/Output Summary (Last 24 hours) at 01/27/2017 1334 Last data filed at 01/27/2017 1478 Gross per 24 hour  Intake 1770 ml  Output -  Net 1770 ml   Filed Weights   01/25/17 2336 01/26/17 0500 01/27/17 0500  Weight: 68.1 kg (150 lb 2.1 oz) 71.2 kg (156 lb 15.5 oz) 72.1 kg (158 lb 15.2 oz)    Gen: Weak-appearing elderly female in no distress Pulm: Non-labored tachypnea, decreased diffusely, worst at bases.  CV: Regular tachycardia. No murmur, rub, or gallop. No JVD, no pedal edema. GI: Abdomen soft, not appreciably tender, non-distended, with normoactive bowel sounds. No organomegaly or masses felt. Ext: Warm, no deformities Neuro: Alert and oriented, more interactive today. No focal neurological deficits. Psych: Judgement and insight appear normal. Mood & affect appropriate.   Data Reviewed: I have personally reviewed following labs and imaging studies  CBC: Recent Labs  Lab 01/25/17 1643 01/26/17 0647  WBC 7.0 6.8  HGB 13.8 12.6  HCT 45.4 40.3  MCV 95.4 94.2  PLT 362 270   Basic Metabolic Panel: Recent Labs  Lab 01/25/17 1643 01/26/17 0647  NA 135 133*  K 5.0 3.9  CL 102 104  CO2 24 22  GLUCOSE 116* 92  BUN 26* 22*  CREATININE 1.12* 0.99  CALCIUM 8.0* 7.2*   GFR: Estimated Creatinine Clearance: 33.1 mL/min (by C-G formula based on SCr of 0.99 mg/dL). Liver Function Tests: Recent Labs  Lab 01/25/17 1643 01/25/17 2028 01/26/17 0647  AST 40 40 23  ALT 12* 11* 10*  ALKPHOS 55 54 36*  BILITOT 1.3* 1.4* 0.8  PROT 6.6 6.7 5.1*  ALBUMIN 3.3* 3.4* 2.6*   Recent Labs  Lab 01/25/17 2028  LIPASE 18   No results for input(s): AMMONIA in the last 168 hours. Coagulation Profile: No results for input(s): INR,  PROTIME in the last 168 hours. Cardiac Enzymes: Recent Labs  Lab 01/25/17 2351 01/26/17 0647 01/26/17 1150  TROPONINI 0.05* 0.06* 0.05*   BNP (last 3 results) No results for input(s): PROBNP in the last 8760 hours. HbA1C: No results for input(s): HGBA1C in the last 72 hours. CBG: No results for input(s): GLUCAP in the last 168 hours. Lipid Profile: No results for input(s): CHOL, HDL, LDLCALC, TRIG, CHOLHDL, LDLDIRECT in the last 72 hours. Thyroid Function Tests: Recent Labs    01/26/17 0647  TSH 0.522   Anemia Panel: No results for input(s): VITAMINB12, FOLATE, FERRITIN, TIBC, IRON, RETICCTPCT in the last 72 hours. Urine analysis:    Component Value Date/Time   COLORURINE YELLOW 01/25/2017 1832   APPEARANCEUR CLEAR 01/25/2017 1832   LABSPEC 1.018 01/25/2017 1832   PHURINE 5.0 01/25/2017 1832   GLUCOSEU NEGATIVE 01/25/2017 1832   HGBUR SMALL (A) 01/25/2017 1832   BILIRUBINUR NEGATIVE 01/25/2017 1832   KETONESUR NEGATIVE 01/25/2017 1832   PROTEINUR NEGATIVE 01/25/2017 1832   UROBILINOGEN 1.0 10/06/2014 1730   NITRITE NEGATIVE 01/25/2017 1832   LEUKOCYTESUR MODERATE (A) 01/25/2017 1832   Recent Results (from the past 240 hour(s))  Gastrointestinal Panel  by PCR , Stool     Status: Abnormal   Collection Time: 01/25/17  9:44 PM  Result Value Ref Range Status   Campylobacter species NOT DETECTED NOT DETECTED Final   Plesimonas shigelloides NOT DETECTED NOT DETECTED Final   Salmonella species NOT DETECTED NOT DETECTED Final   Yersinia enterocolitica NOT DETECTED NOT DETECTED Final   Vibrio species NOT DETECTED NOT DETECTED Final   Vibrio cholerae NOT DETECTED NOT DETECTED Final   Enteroaggregative E coli (EAEC) NOT DETECTED NOT DETECTED Final   Enteropathogenic E coli (EPEC) NOT DETECTED NOT DETECTED Final   Enterotoxigenic E coli (ETEC) NOT DETECTED NOT DETECTED Final   Shiga like toxin producing E coli (STEC) NOT DETECTED NOT DETECTED Final   Shigella/Enteroinvasive E  coli (EIEC) NOT DETECTED NOT DETECTED Final   Cryptosporidium NOT DETECTED NOT DETECTED Final   Cyclospora cayetanensis NOT DETECTED NOT DETECTED Final   Entamoeba histolytica NOT DETECTED NOT DETECTED Final   Giardia lamblia NOT DETECTED NOT DETECTED Final   Adenovirus F40/41 NOT DETECTED NOT DETECTED Final   Astrovirus NOT DETECTED NOT DETECTED Final   Norovirus GI/GII DETECTED (A) NOT DETECTED Final    Comment: RESULT CALLED TO, READ BACK BY AND VERIFIED WITH:  CHELSEA YOUNG AT 1234 01/27/17 SDR    Rotavirus A NOT DETECTED NOT DETECTED Final   Sapovirus (I, II, IV, and V) NOT DETECTED NOT DETECTED Final    Comment: Performed at [facility], [address] [place], [place] [address]  C difficile quick scan w PCR reflex     Status: None   Collection Time: 01/25/17  9:44 PM  Result Value Ref Range Status   C Diff antigen NEGATIVE NEGATIVE Final   C Diff toxin NEGATIVE NEGATIVE Final   C Diff interpretation No C. difficile detected.  Final      Radiology Studies: Ct Head Wo Contrast  Result Date: 01/25/2017 CLINICAL DATA:  Ataxia and weakness EXAM: CT HEAD WITHOUT CONTRAST TECHNIQUE: Contiguous axial images were obtained from the base of the skull through the vertex without intravenous contrast. COMPARISON:  Head CT 10/06/2014, brain MRI 07/08/2014 FINDINGS: Brain: No acute territorial infarction, hemorrhage, or intracranial mass is visualized. Old left cerebellar infarct. Mild small vessel ischemic changes of the white matter. Mild atrophy. Stable ventricle size. Vascular: No hyperdense vessels. Stable asymmetric soft tissue density at the right cavernous sinus, felt to correspond to the previously noted aneurysm on MRA. Scattered calcifications at the carotid siphons. Skull: No fracture.  Mastoid air cells are clear. Sinuses/Orbits: Fluid levels in the maxillary sinuses with moderate left maxillary sinus mucosal thickening. Mild mucosal thickening in the ethmoid sinuses. No acute  orbital abnormality. Other: None IMPRESSION: 1. No CT evidence for acute intracranial abnormality. Atrophy and small vessel ischemic changes. 2. Bilateral maxillary sinusitis 3. Stable asymmetric soft tissue density at the right cavernous sinus, felt to correspond to previously noted aneurysm on MRA from 2,016. Electronically Signed   By: Jasmine Pang M.D.   On: 01/25/2017 23:33   Ct Angio Chest Pe W Or Wo Contrast  Result Date: 01/26/2017 CLINICAL DATA:  84 year old female with concern for pulmonary embolism. EXAM: CT ANGIOGRAPHY CHEST WITH CONTRAST TECHNIQUE: Multidetector CT imaging of the chest was performed using the standard protocol during bolus administration of intravenous contrast. Multiplanar CT image reconstructions and MIPs were obtained to evaluate the vascular anatomy. CONTRAST:  80mL ISOVUE-370 IOPAMIDOL (ISOVUE-370) INJECTION 76% COMPARISON:  Chest radiograph dated 01/25/2017 FINDINGS: Cardiovascular: There is no cardiomegaly or pericardial  effusion. The thoracic aorta is unremarkable. The origins of the great vessels of the aortic arch are patent. No CT evidence of pulmonary embolism. Mediastinum/Nodes: No hilar adenopathy. Mediastinal lymph node measures 12 mm in short axis anterior to the carina. There is a moderate size hiatal hernia containing portion of the stomach. The esophagus is grossly unremarkable. No mediastinal fluid collection. Lungs/Pleura: Chronic parenchymal scarring and traction bronchiectasis in the left lower lobe and lingula. There is associated decreased volume in the left lower lobe. Cluster of nodularity at the right lung base (series 6, image 86) likely chronic. An infectious process is not excluded. Clinical correlation recommended. An area of scarring noted in the right upper lobe along the minor fissure. There is no pleural effusion or pneumothorax. The central airways are patent. Upper Abdomen: No acute abnormality. Musculoskeletal: Degenerative changes of the  spine. No acute osseous pathology. Review of the MIP images confirms the above findings. IMPRESSION: 1. No CT evidence of pulmonary embolism. 2. Chronic fibrosis and bronchiectatic changes in the left lower lobe and lingula. 3. Cluster of nodularity in the right lower lobe may be chronic or represent an infectious process. Clinical correlation is recommended. 4. Moderate size hiatal hernia. Electronically Signed   By: Elgie Collard M.D.   On: 01/26/2017 04:42   Dg Abd Acute W/chest  Result Date: 01/25/2017 CLINICAL DATA:  Nausea and vomiting for several hours EXAM: DG ABDOMEN ACUTE W/ 1V CHEST COMPARISON:  12/10/2016, 12/01/2014 FINDINGS: Cardiac shadow is mildly enlarged. Hiatal hernia is again seen. Stable nodular density is noted in the right upper lobe unchanged from the previous exam as well as previous exams. No focal infiltrate or sizable effusion is seen. Scoliosis concave to the left is noted within the lower thoracic spine. No free air is identified. Scattered large and small bowel gas is seen. Scoliosis of the lumbar spine is noted. No other focal abnormality is seen. IMPRESSION: Chronic changes without acute abnormality. Electronically Signed   By: Alcide Clever M.D.   On: 01/25/2017 19:08    Scheduled Meds: . ALPRAZolam  0.5 mg Oral BID  . atorvastatin  10 mg Oral q1800  . carvedilol  6.25 mg Oral BID WC  . clonazePAM  0.25 mg Oral QHS  . clopidogrel  75 mg Oral Daily  . DULoxetine  20 mg Oral TID  . enoxaparin (LOVENOX) injection  30 mg Subcutaneous QHS  . escitalopram  2.5 mg Oral Daily  . gabapentin  100 mg Oral Daily  . hydrochlorothiazide  12.5 mg Oral Daily  . latanoprost  1 drop Both Eyes QHS  . levalbuterol  1.25 mg Nebulization TID  . levothyroxine  50 mcg Oral QAC breakfast  . loratadine  10 mg Oral Daily  . losartan  50 mg Oral BID  . pantoprazole  40 mg Oral QAC breakfast  . predniSONE  10 mg Oral Q breakfast  . saccharomyces boulardii  250 mg Oral BID  . sodium  chloride HYPERTONIC  4 mL Nebulization BID   Continuous Infusions: . sodium chloride 100 mL/hr at 01/27/17 0902     LOS: 2 days   Time spent: 25 minutes.  Hazeline Junker, MD [facility] Pager [phone]  If 7PM-7AM, please contact night-coverage [email or web address] Password [email or web address] 01/27/2017, 1:34 PM

## 2017-01-27 NOTE — Care Management Note (Signed)
Case Management Note  Patient Details  Name: Sarah [name] MRN: [id number] Date of Birth: [birth year]  Subjective/Objective:                  hyponatremia with aki  Action/Plan: Date:  January 27, 2017 Chart reviewed for concurrent status and case management needs.  Will continue to follow patient progress.  Discharge Planning: following for needs. Lives at the independent living section of [facility].  None present at this time of review. Expected discharge date: January 172019 Marcelle SmilingRhonda Duey Liller, BSN, [place]3, [place]   [phone]   Expected Discharge Date:                  Expected Discharge Plan:  Assisted Living / Rest Home  In-House Referral:  Clinical Social Work  Discharge planning Services  CM Consult  Post Acute Care Choice:    Choice offered to:     DME Arranged:    DME Agency:     HH Arranged:    HH Agency:     Status of Service:  In process, will continue to follow  If discussed at Long Length of Stay Meetings, dates discussed:    Additional Comments:  Golda AcreDavis, Fumio Vandam Lynn, RN 01/27/2017, 10:07 AM

## 2017-01-28 LAB — BASIC METABOLIC PANEL
Anion gap: 6 (ref 5–15)
BUN: 10 mg/dL (ref 6–20)
CHLORIDE: 105 mmol/L (ref 101–111)
CO2: 24 mmol/L (ref 22–32)
Calcium: 7.5 mg/dL — ABNORMAL LOW (ref 8.9–10.3)
Creatinine, Ser: 0.72 mg/dL (ref 0.44–1.00)
GFR calc Af Amer: >60 mL/min (ref >60)
GFR calc non Af Amer: >60 mL/min (ref >60)
GLUCOSE: 92 mg/dL (ref 65–99)
POTASSIUM: 3.8 mmol/L (ref 3.5–5.1)
Sodium: 135 mmol/L (ref 135–145)

## 2017-01-28 MED ORDER — SODIUM CHLORIDE 3 % IN NEBU
4.0000 mL | INHALATION_SOLUTION | Freq: Every day | RESPIRATORY_TRACT | Status: DC
  Administered 2017-01-29: 4 mL via RESPIRATORY_TRACT
  Filled 2017-01-28 (×2): qty 4

## 2017-01-28 MED ORDER — LEVALBUTEROL HCL 1.25 MG/0.5ML IN NEBU
1.2500 mg | INHALATION_SOLUTION | Freq: Three times a day (TID) | RESPIRATORY_TRACT | Status: DC
  Administered 2017-01-28 – 2017-01-29 (×2): 1.25 mg via RESPIRATORY_TRACT
  Filled 2017-01-28 (×4): qty 0.5

## 2017-01-28 NOTE — Care Management Important Message (Addendum)
Important Message  Patient Details IM Letter given to Rhonda/Case Manager to present to the Patient  Name: Sarah [name] MRN: [id number] Date of Birth: [birth year]   Medicare Important Message Given:  Yes    Caren MacadamFuller, Damara Klunder 01/28/2017, 11:46 AMImportant Message  Patient Details  Name: Sarah FlavorsGertrude Juneau MRN: [id number] Date of Birth: [birth year]   Medicare Important Message Given:  Yes    Caren MacadamFuller, Jayion Schneck 01/28/2017, 11:46 AM

## 2017-01-28 NOTE — Progress Notes (Signed)
Physical Therapy Treatment Patient Details Name: Sarah [name] MRN: [id number] DOB: [birth year] Today's Date: 01/28/2017    History of Present Illness 84 y.o. female with a history of obstructive lung disease, bronchiectasis on chronic steroids and cycling antibiotics, NSTEMI with nonobstructive CAD, GERD with hiatal hernia, and hypothyroidism who was brought to the ED from ILF by her daughter for progressive weakness in the setting of diarrhea, nausea and vomiting     PT Comments    Pt is up to walk with assistance and cues for safety, reminders for avoiding unsafe set up to sit and to use hands to control descent to sit.  Her plan is for SNF follow up with daughter there today to reinforce her choices for safety and plans at DC. PT to continue for mobility until her DC to SNF.  Follow Up Recommendations  SNF     Equipment Recommendations  None recommended by PT    Recommendations for Other Services       Precautions / Restrictions Precautions Precautions: Fall Restrictions Weight Bearing Restrictions: No    Mobility  Bed Mobility Overal bed mobility: Needs Assistance Bed Mobility: Supine to Sit     Supine to sit: Supervision     General bed mobility comments: used rail and took extra time  Transfers Overall transfer level: Needs assistance Equipment used: Rolling walker (2 wheeled) Transfers: Sit to/from Stand Sit to Stand: Min guard         General transfer comment: cued hand placement but pt can clear chair or bed with hands to push off  Ambulation/Gait Ambulation/Gait assistance: Min guard;Min assist Ambulation Distance (Feet): 30 Feet Assistive device: Rolling walker (2 wheeled) Gait Pattern/deviations: Step-to pattern;Step-through pattern;Decreased stride length;Narrow base of support;Trunk flexed Gait velocity: reduced Gait velocity interpretation: Below normal speed for age/gender General Gait Details: shuffled today and lost her control of RW with  cues and physical assist needed   Stairs            Wheelchair Mobility    Modified Rankin (Stroke Patients Only)       Balance Overall balance assessment: Needs assistance Sitting-balance support: No upper extremity supported;Feet supported Sitting balance-Leahy Scale: Fair     Standing balance support: Bilateral upper extremity supported;During functional activity Standing balance-Leahy Scale: Poor Standing balance comment: reliant on UEs for safe standing balance                            Cognition Arousal/Alertness: Awake/alert Behavior During Therapy: WFL for tasks assessed/performed Overall Cognitive Status: Within Functional Limits for tasks assessed                                        Exercises      General Comments        Pertinent Vitals/Pain Pain Assessment: No/denies pain    Home Living                      Prior Function            PT Goals (current goals can now be found in the care plan section) Acute Rehab PT Goals Patient Stated Goal: to get better, doesn't mind going to rehab if needed Progress towards PT goals: Progressing toward goals    Frequency    Min 3X/week      PT Plan Current plan  remains appropriate    Co-evaluation              AM-PAC PT "6 Clicks" Daily Activity  Outcome Measure  Difficulty turning over in bed (including adjusting bedclothes, sheets and blankets)?: A Little Difficulty moving from lying on back to sitting on the side of the bed? : A Little Difficulty sitting down on and standing up from a chair with arms (e.g., wheelchair, bedside commode, etc,.)?: Unable Help needed moving to and from a bed to chair (including a wheelchair)?: A Little Help needed walking in hospital room?: A Little Help needed climbing 3-5 steps with a railing? : A Lot 6 Click Score: 15    End of Session Equipment Utilized During Treatment: Gait belt Activity Tolerance: Patient  tolerated treatment well Patient left: in chair;with call bell/phone within reach;with family/visitor present(no chair alarm pads)   PT Visit Diagnosis: Difficulty in walking, not elsewhere classified (R26.2);Muscle weakness (generalized) (M62.81)     Time: [id number] PT Time Calculation (min) (ACUTE ONLY): 32 min  Charges:  $Gait Training: 8-22 mins $Therapeutic Activity: 8-22 mins                    G Codes:  Functional Assessment Tool Used: AM-PAC 6 Clicks Basic Mobility    Ivar DrapeRuth E Ramsey Midgett 01/28/2017, 12:25 PM   Samul Dadauth Sakoya Win, PT MS Acute Rehab Dept. Number: [facility] [phone] and [facility] [phone]

## 2017-01-28 NOTE — Progress Notes (Signed)
PROGRESS NOTE  Sarah [name]  XBM:[id number] DOB: 07/28/1921 DOA: 01/25/2017 PCP: [name], [name], MD  Outpatient Specialists: Pulmonology, [place]; Cardiology, Nahser Brief Narrative: Sarah FlavorsGertrude Cala is a 84 y.o. female with a history of obstructive lung disease, bronchiectasis on chronic steroids and cycling antibiotics, NSTEMI with nonobstructive CAD, GERD with hiatal hernia, and hypothyroidism who was brought to the ED from [facility] by her daughter for progressive weakness in the setting of diarrhea, nausea and vomiting over the past few days. Sinus tachycardia was noted on arrival, initial troponin negative, d-dimer checked, found to be positive, subsequent CTA chest showed RLL nodularity, fibrosis, LLL bronchiectatic changes, and a moderate hiatal hernia. CT head showed only chronic small vessel ischemic changes and atrophy. SCr slightly above baseline at 1.12. WBC normal at 7. Troponins have remained low with flat trend 0.05 > 0.06 > 0.05. Norovirus was found in pathogen panel. UA without pyuria. She was given IV fluids and admitted. Diarrhea has improved and she is taking po. PT recommends short term rehabilitation and CSW has been asked to facilitate this.   Assessment & Plan: Principal Problem:   Gastroenteritis due to norovirus Active Problems:   BRONCHIECTASIS   Hypothyroidism   Anxiety   Chronic diastolic CHF (congestive heart failure) (HCC)   Aneurysm, cerebral, nonruptured   HTN (hypertension)  Norovirus gastroenteritis: Pt is at very elevated risk of complications due to age (turned 84yo last week). - Strict contact precautions. Staff and family members have developed symptoms.  - Probiotic (on chronic antibiotics) - Taking sufficient intake by mouth, will DC IVF's  - Prn zofran  Weakness: Generalized, previously ambulated independently with cane. Thought to be due to dehydration, diarrhea, etc. as above. CT head without infarct, no focal deficits. - PT ordered, will benefit from  short term rehab, prefers [facility]. CSW assistance appreciated.  Acute hypoxic respiratory failure, bronchiectasis: Resolved without targeted intervention. CTA chest without PE. Traction bronchiectasis in left lung noted, and nodularity at right base also noted. No fever, leukocytosis, or increase in cough.  - Continue prednisone, no stress dose for now - Bronchodilators, respiratory therapy consulted. Using xopenex with ongoing tachycardia.  - Daily hypertonic saline helping with clearance, but taxing to take so frequently, so decreased to daily.  Nonobstructive CAD, HTN: No chest pain, troponins flat at 0.05 > 0.06 > 0.05, no ischemic ECG findings. No wall motion abnormalities on echocardiogram. TSH 0.522.  - Monitor on home medications including coreg, losartan, HCTZ, plavix  Anxiety: Chronic, exacerbated by hospitalization.  - On xanax BID and clonazepam qHS in addition to duloxetine and lexapro which will all be continued. Discussed very elevated risk of falling and worsening delirium with benzodiazepines.   Hypothyroidism: TSH 0.522.  - Continue synthroid  GERD: Chronic, stable.  - PPI  Glaucoma:  - Continue home gtt's  Nonruptured cerebral aneurysm: Has seen neurology as an outpatient, CT here showed stability.  - Reassurance provided.   DVT prophylaxis: Lovenox Code Status: DNR Family Communication: Daughter at bedside Disposition Plan: Short term SNF for rehab. Stable for DC, pending insurance auth and bed availability.   Consultants:   None  Procedures:  Echocardiogram 01/26/2017:  - Left ventricle: The cavity size was normal. Wall thickness was   increased in a pattern of mild LVH. There was mild focal basal   hypertrophy of the septum. Systolic function was vigorous. The   estimated ejection fraction was in the range of 65% to 70%. Wall   motion was normal; there were no regional wall motion  abnormalities. Doppler parameters are consistent with abnormal   left  ventricular relaxation (grade 1 diastolic dysfunction). - Mitral valve: Calcified annulus. - Left atrium: The atrium was mildly dilated.  Impressions: - Vigorous LV systolic function; mild LVH with proximal septal   thickening; mild diastolic dysfunction; mild LAE; mild TR.  Antimicrobials:  None   Subjective: Eating better, no diarrhea today. No vomiting or abdominal pain. Still feeling weak.   Objective: Vitals:   01/27/17 2024 01/27/17 2220 01/28/17 0604 01/28/17 0857  BP:  108/72 132/69   Pulse:  76 85   Resp:  20 18   Temp:  97.8 F (36.6 C) 97.7 F (36.5 C)   TempSrc:  Oral Oral   SpO2: 99% 98% 94% 97%  Weight:      Height:        Intake/Output Summary (Last 24 hours) at 01/28/2017 1455 Last data filed at 01/28/2017 1610 Gross per 24 hour  Intake 2536.67 ml  Output 800 ml  Net 1736.67 ml   Filed Weights   01/25/17 2336 01/26/17 0500 01/27/17 0500  Weight: 68.1 kg (150 lb 2.1 oz) 71.2 kg (156 lb 15.5 oz) 72.1 kg (158 lb 15.2 oz)    Gen: Pleasant elderly female in no distress Pulm: Non-labored and decreased diffusely, worst at bases.  CV: Regular rate and rhythm. No murmur, rub, or gallop. No JVD, no pedal edema. GI: Abdomen soft, not appreciably tender without rebound or guarding, non-distended, with normoactive bowel sounds. No organomegaly or masses felt. Ext: Warm, no deformities Neuro: Alert and oriented, conversant. No focal neurological deficits. Psych: Judgement and insight appear normal. Mood & affect appropriate.   Data Reviewed: I have personally reviewed following labs and imaging studies  CBC: Recent Labs  Lab 01/25/17 1643 01/26/17 0647  WBC 7.0 6.8  HGB 13.8 12.6  HCT 45.4 40.3  MCV 95.4 94.2  PLT 362 270   Basic Metabolic Panel: Recent Labs  Lab 01/25/17 1643 01/26/17 0647 01/28/17 0630  NA 135 133* 135  K 5.0 3.9 3.8  CL 102 104 105  CO2 24 22 24   GLUCOSE 116* 92 92  BUN 26* 22* 10  CREATININE 1.12* 0.99 0.72  CALCIUM  8.0* 7.2* 7.5*   GFR: Estimated Creatinine Clearance: 40.9 mL/min (by C-G formula based on SCr of 0.72 mg/dL). Liver Function Tests: Recent Labs  Lab 01/25/17 1643 01/25/17 2028 01/26/17 0647  AST 40 40 23  ALT 12* 11* 10*  ALKPHOS 55 54 36*  BILITOT 1.3* 1.4* 0.8  PROT 6.6 6.7 5.1*  ALBUMIN 3.3* 3.4* 2.6*   Recent Labs  Lab 01/25/17 2028  LIPASE 18   No results for input(s): AMMONIA in the last 168 hours. Coagulation Profile: No results for input(s): INR, PROTIME in the last 168 hours. Cardiac Enzymes: Recent Labs  Lab 01/25/17 2351 01/26/17 0647 01/26/17 1150  TROPONINI 0.05* 0.06* 0.05*   BNP (last 3 results) No results for input(s): PROBNP in the last 8760 hours. HbA1C: No results for input(s): HGBA1C in the last 72 hours. CBG: No results for input(s): GLUCAP in the last 168 hours. Lipid Profile: No results for input(s): CHOL, HDL, LDLCALC, TRIG, CHOLHDL, LDLDIRECT in the last 72 hours. Thyroid Function Tests: Recent Labs    01/26/17 0647  TSH 0.522   Anemia Panel: No results for input(s): VITAMINB12, FOLATE, FERRITIN, TIBC, IRON, RETICCTPCT in the last 72 hours. Urine analysis:    Component Value Date/Time   COLORURINE YELLOW 01/25/2017 1832   APPEARANCEUR CLEAR 01/25/2017  1832   LABSPEC 1.018 01/25/2017 1832   PHURINE 5.0 01/25/2017 1832   GLUCOSEU NEGATIVE 01/25/2017 1832   HGBUR SMALL (A) 01/25/2017 1832   BILIRUBINUR NEGATIVE 01/25/2017 1832   KETONESUR NEGATIVE 01/25/2017 1832   PROTEINUR NEGATIVE 01/25/2017 1832   UROBILINOGEN 1.0 10/06/2014 1730   NITRITE NEGATIVE 01/25/2017 1832   LEUKOCYTESUR MODERATE (A) 01/25/2017 1832   Recent Results (from the past 240 hour(s))  Gastrointestinal Panel by PCR , Stool     Status: Abnormal   Collection Time: 01/25/17  9:44 PM  Result Value Ref Range Status   Campylobacter species NOT DETECTED NOT DETECTED Final   Plesimonas shigelloides NOT DETECTED NOT DETECTED Final   Salmonella species NOT  DETECTED NOT DETECTED Final   Yersinia enterocolitica NOT DETECTED NOT DETECTED Final   Vibrio species NOT DETECTED NOT DETECTED Final   Vibrio cholerae NOT DETECTED NOT DETECTED Final   Enteroaggregative E coli (EAEC) NOT DETECTED NOT DETECTED Final   Enteropathogenic E coli (EPEC) NOT DETECTED NOT DETECTED Final   Enterotoxigenic E coli (ETEC) NOT DETECTED NOT DETECTED Final   Shiga like toxin producing E coli (STEC) NOT DETECTED NOT DETECTED Final   Shigella/Enteroinvasive E coli (EIEC) NOT DETECTED NOT DETECTED Final   Cryptosporidium NOT DETECTED NOT DETECTED Final   Cyclospora cayetanensis NOT DETECTED NOT DETECTED Final   Entamoeba histolytica NOT DETECTED NOT DETECTED Final   Giardia lamblia NOT DETECTED NOT DETECTED Final   Adenovirus F40/41 NOT DETECTED NOT DETECTED Final   Astrovirus NOT DETECTED NOT DETECTED Final   Norovirus GI/GII DETECTED (A) NOT DETECTED Final    Comment: RESULT CALLED TO, READ BACK BY AND VERIFIED WITH:  CHELSEA YOUNG AT 1234 01/27/17 SDR    Rotavirus A NOT DETECTED NOT DETECTED Final   Sapovirus (I, II, IV, and V) NOT DETECTED NOT DETECTED Final    Comment: Performed at [facility], [address] [place], [place] [address]  C difficile quick scan w PCR reflex     Status: None   Collection Time: 01/25/17  9:44 PM  Result Value Ref Range Status   C Diff antigen NEGATIVE NEGATIVE Final   C Diff toxin NEGATIVE NEGATIVE Final   C Diff interpretation No C. difficile detected.  Final      Radiology Studies: No results found.  Scheduled Meds: . ALPRAZolam  0.5 mg Oral BID  . atorvastatin  10 mg Oral q1800  . carvedilol  6.25 mg Oral BID WC  . clonazePAM  0.25 mg Oral QHS  . clopidogrel  75 mg Oral Daily  . DULoxetine  20 mg Oral TID  . enoxaparin (LOVENOX) injection  30 mg Subcutaneous QHS  . escitalopram  2.5 mg Oral Daily  . gabapentin  100 mg Oral Daily  . hydrochlorothiazide  12.5 mg Oral Daily  . latanoprost  1 drop Both Eyes QHS   . levalbuterol  1.25 mg Nebulization TID  . levothyroxine  50 mcg Oral QAC breakfast  . loratadine  10 mg Oral Daily  . losartan  50 mg Oral BID  . pantoprazole  40 mg Oral QAC breakfast  . predniSONE  10 mg Oral Q breakfast  . saccharomyces boulardii  250 mg Oral BID  . sodium chloride HYPERTONIC  4 mL Nebulization BID  . sodium chloride HYPERTONIC  4 mL Nebulization Daily   Continuous Infusions:    LOS: 3 days   Time spent: 25 minutes.  Hazeline Junker, MD [facility] Pager [phone]  If 7PM-7AM, please  contact night-coverage [email or web address] Password [facility] 01/28/2017, 2:55 PM

## 2017-01-28 NOTE — Clinical Social Work Note (Signed)
Clinical Social Work Assessment  Patient Details  Name: Sarah [name] MRN: [id number] Date of Birth: [birth year]  Date of referral:  01/28/17               Reason for consult:  Facility Placement, Discharge Planning                Permission sought to share information with:  Case Manager, Facility Sport and exercise psychologist, Family Supports Permission granted to share information::     Name::     Sarah [name] and Advertising copywriter::  SNF  Relationship::  Daughters  Sport and exercise psychologist Information:     Housing/Transportation Living arrangements for the past 2 months:  Independent Living Facility([facility]) Source of Information:  Patient, Adult Children Patient Interpreter Needed:  None Criminal Activity/Legal Involvement Pertinent to Current Situation/Hospitalization:  No - Comment as needed Significant Relationships:  Adult Children, Warehouse manager Lives with:  Facility Resident Do you feel safe going back to the place where you live?  Yes Need for family participation in patient care:  Yes (Comment)  Care giving concerns:  No care giving concerns at the time of assessment.    Social Worker assessment / plan:  LCSW consulted for SNF placement.  Patient admitted for Gastroenteritis due to norovirus.   LCSW met with patient at bedside. Patients daughters Sarah [name] and Sarah [name] are present.   LCSW explained role and reason for visit.   Patient reports that she lives at [facility] in independent living. She states that she has been there 1 year and 3 months. Patient reports that she is independent in her ADLs and uses a cane to ambulate most of the time. She reports she uses a walker when she goes to the [employer]. Patient has med management assistance from the facility.   Patient is agreeable to SNF at dc. Patient and family would prefer [facility] or [facility] at dc.   Employment status:  Retired Forensic scientist:  Medicare PT Recommendations:  [place] /  Referral to community resources:     Patient/Family's Response to care:  Patient and family are proactive in care. Patient reports she prefers to go to rehab to get stronger apposed to [place]. Patient and family are appreciative of LCSW visit and services.   Patient/Family's Understanding of and Emotional Response to Diagnosis, Current Treatment, and Prognosis:  Patient and family are understanding of patients current diagnosis and agreeable to treatment plan.   Emotional Assessment Appearance:  Appears younger than stated age Attitude/Demeanor/Rapport:  Gracious Affect (typically observed):  Calm, Pleasant Orientation:  Oriented to Self, Oriented to Place, Oriented to  Time, Oriented to Situation Alcohol / Substance use:  Not Applicable Psych involvement (Current and /or in the community):  No (Comment)  Discharge Needs  Concerns to be addressed:  No discharge needs identified Readmission within the last 30 days:  No Current discharge risk:  None Barriers to Discharge:  No Barriers Identified   Servando Snare, LCSW 01/28/2017, 4:20 PM

## 2017-01-29 DIAGNOSIS — K449 Diaphragmatic hernia without obstruction or gangrene: Secondary | ICD-10-CM | POA: Diagnosis not present

## 2017-01-29 DIAGNOSIS — F331 Major depressive disorder, recurrent, moderate: Secondary | ICD-10-CM | POA: Diagnosis not present

## 2017-01-29 DIAGNOSIS — E860 Dehydration: Secondary | ICD-10-CM

## 2017-01-29 DIAGNOSIS — M6281 Muscle weakness (generalized): Secondary | ICD-10-CM | POA: Diagnosis not present

## 2017-01-29 DIAGNOSIS — R000 Tachycardia, unspecified: Secondary | ICD-10-CM

## 2017-01-29 DIAGNOSIS — I10 Essential (primary) hypertension: Secondary | ICD-10-CM | POA: Diagnosis not present

## 2017-01-29 DIAGNOSIS — R2689 Other abnormalities of gait and mobility: Secondary | ICD-10-CM | POA: Diagnosis not present

## 2017-01-29 DIAGNOSIS — I5032 Chronic diastolic (congestive) heart failure: Secondary | ICD-10-CM

## 2017-01-29 DIAGNOSIS — I671 Cerebral aneurysm, nonruptured: Secondary | ICD-10-CM

## 2017-01-29 DIAGNOSIS — R112 Nausea with vomiting, unspecified: Secondary | ICD-10-CM

## 2017-01-29 DIAGNOSIS — K529 Noninfective gastroenteritis and colitis, unspecified: Secondary | ICD-10-CM | POA: Diagnosis not present

## 2017-01-29 DIAGNOSIS — E785 Hyperlipidemia, unspecified: Secondary | ICD-10-CM | POA: Diagnosis not present

## 2017-01-29 DIAGNOSIS — F419 Anxiety disorder, unspecified: Secondary | ICD-10-CM | POA: Diagnosis not present

## 2017-01-29 DIAGNOSIS — N179 Acute kidney failure, unspecified: Secondary | ICD-10-CM

## 2017-01-29 DIAGNOSIS — R197 Diarrhea, unspecified: Secondary | ICD-10-CM

## 2017-01-29 DIAGNOSIS — R531 Weakness: Secondary | ICD-10-CM | POA: Diagnosis not present

## 2017-01-29 DIAGNOSIS — J449 Chronic obstructive pulmonary disease, unspecified: Secondary | ICD-10-CM | POA: Diagnosis not present

## 2017-01-29 DIAGNOSIS — J479 Bronchiectasis, uncomplicated: Secondary | ICD-10-CM

## 2017-01-29 DIAGNOSIS — K219 Gastro-esophageal reflux disease without esophagitis: Secondary | ICD-10-CM | POA: Diagnosis not present

## 2017-01-29 DIAGNOSIS — F411 Generalized anxiety disorder: Secondary | ICD-10-CM | POA: Diagnosis not present

## 2017-01-29 DIAGNOSIS — R1311 Dysphagia, oral phase: Secondary | ICD-10-CM | POA: Diagnosis not present

## 2017-01-29 DIAGNOSIS — A0811 Acute gastroenteropathy due to Norwalk agent: Principal | ICD-10-CM

## 2017-01-29 DIAGNOSIS — R278 Other lack of coordination: Secondary | ICD-10-CM | POA: Diagnosis not present

## 2017-01-29 DIAGNOSIS — E039 Hypothyroidism, unspecified: Secondary | ICD-10-CM | POA: Diagnosis not present

## 2017-01-29 DIAGNOSIS — E46 Unspecified protein-calorie malnutrition: Secondary | ICD-10-CM | POA: Diagnosis not present

## 2017-01-29 DIAGNOSIS — I2510 Atherosclerotic heart disease of native coronary artery without angina pectoris: Secondary | ICD-10-CM | POA: Diagnosis not present

## 2017-01-29 NOTE — Progress Notes (Signed)
Called report to SNF. Gave report to Dole FoodCaroline RN.

## 2017-01-29 NOTE — NC FL2 (Signed)
[place] MEDICAID FL2 LEVEL OF CARE SCREENING TOOL     IDENTIFICATION  Patient Name: Sarah FlavorsGertrude [name] Birthdate: [birth year] Sex: female Admission Date (Current Location): 01/25/2017  [facility] [place]   Facility and Address:  [facility],  [address], [place] [id number]      Provider Number: [id number]  Attending Physician Name and Address:  Darlin DropHall, Carole N, DO  Relative Name and Phone Number:       Current Level of Care: Hospital Recommended Level of Care: Skilled Nursing Facility Prior Approval Number:    Date Approved/Denied:   PASRR Number:    Discharge Plan: SNF    Current Diagnoses: Patient Active Problem List   Diagnosis Date Noted  . Gastroenteritis due to norovirus 01/25/2017  . Adrenal insufficiency (HCC) 03/21/2015  . HTN (hypertension) 02/14/2015  . Fecal impaction (HCC) 10/07/2014  . Colitis 10/06/2014  . 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 Aneurysm, cerebral, nonruptured 09/09/2014  . HLD (hyperlipidemia) 09/09/2014  . Acute ischemic stroke (HCC) 07/09/2014  . Chronic diastolic CHF (congestive heart failure) (HCC) 07/09/2014  . Stroke (HCC) 07/08/2014  . TIA (transient ischemic attack) 07/08/2014  . Chest pain 04/15/2012  . Shortness of breath dyspnea 11/23/2011  . Anxiety 11/23/2011  . Malignant hypertension 11/23/2011  . Elevated troponin 06/20/2011  . Depression   . Hypothyroidism   . Arthritis   . Racing heart beat 02/11/2011  . NSTEMI (non-ST elevated myocardial infarction) (HCC) 09/24/2010  . Idiopathic peripheral neuropathy   . Pollen allergies   . BRONCHIECTASIS 07/29/2008  . Essential hypertension 06/15/2008  . Allergic rhinitis 06/15/2008  . Asthma 06/15/2008  . Cough 06/15/2008    Orientation RESPIRATION BLADDER Height & Weight     Self, Time, Situation, Place  Normal Continent Weight: 158 lb 15.2 oz (72.1 kg) Height:  5\' 3"  (160 cm)  BEHAVIORAL  SYMPTOMS/MOOD NEUROLOGICAL BOWEL NUTRITION STATUS      Continent Diet(see dc summary)  AMBULATORY STATUS COMMUNICATION OF NEEDS Skin   Extensive Assist Verbally Normal                       Personal Care Assistance Level of Assistance  Bathing, Feeding, Dressing Bathing Assistance: Limited assistance Feeding assistance: Independent Dressing Assistance: Limited assistance     Functional Limitations Info  Sight, Hearing, Speech Sight Info: Adequate Hearing Info: Impaired Speech Info: Adequate    SPECIAL CARE FACTORS FREQUENCY  PT (By licensed PT), OT (By licensed OT)     PT Frequency: 5x/week OT Frequency: 5x/week            Contractures Contractures Info: Not present    Additional Factors Info  Code Status, Allergies Code Status Info: DNR Allergies Info: Codeine, Hydrocodone, Isosorbide Nitrate, Oxycodone, Clarithromycin, Fiorinal Butalbital-aspirin-caffeine, Levofloxacin, Augmentin Amoxicillin-pot Clavulanate           Current Medications (01/29/2017):  This is the current hospital active medication list Current Facility-Administered Medications  Medication Dose Route Frequency Provider Last Rate Last Dose  . acetaminophen (TYLENOL) tablet 650 mg  650 mg Oral Q6H PRN Pearson GrippeKim, James, MD   650 mg at 01/29/17 0221   Or  . acetaminophen (TYLENOL) suppository 650 mg  650 mg Rectal Q6H PRN Pearson GrippeKim, James, MD      . albuterol (PROVENTIL) (2.5 MG/3ML) 0.083% nebulizer solution 2.5 mg  2.5 mg Nebulization Q6H PRN Pearson GrippeKim, James, MD      . ALPRAZolam Prudy Feeler(XANAX) tablet 0.5 mg  0.5 mg Oral BID Tyrone Nine, MD   0.5 mg at 01/28/17 2139  . atorvastatin (LIPITOR) tablet 10 mg  10 mg Oral q1800 Pearson Grippe, MD   10 mg at 01/28/17 1805  . carvedilol (COREG) tablet 6.25 mg  6.25 mg Oral BID WC Pearson Grippe, MD   6.25 mg at 01/29/17 0858  . clonazePAM (KLONOPIN) tablet 0.25 mg  0.25 mg Oral QHS Pearson Grippe, MD   0.25 mg at 01/28/17 2138  . clopidogrel (PLAVIX) tablet 75 mg  75 mg Oral Daily Pearson Grippe, MD   75 mg at 01/28/17 1016  . DULoxetine (CYMBALTA) DR capsule 20 mg  20 mg Oral TID Tyrone Nine, MD   20 mg at 01/28/17 2138  . enoxaparin (LOVENOX) injection 30 mg  30 mg Subcutaneous QHS Pearson Grippe, MD   30 mg at 01/28/17 2139  . escitalopram (LEXAPRO) tablet 2.5 mg  2.5 mg Oral Daily Pearson Grippe, MD   2.5 mg at 01/28/17 1014  . gabapentin (NEURONTIN) capsule 100 mg  100 mg Oral Daily Pearson Grippe, MD   100 mg at 01/28/17 1015  . hydrochlorothiazide (HYDRODIURIL) tablet 12.5 mg  12.5 mg Oral Daily Pearson Grippe, MD   12.5 mg at 01/28/17 1016  . latanoprost (XALATAN) 0.005 % ophthalmic solution 1 drop  1 drop Both Eyes QHS Pearson Grippe, MD   1 drop at 01/28/17 2138  . levalbuterol (XOPENEX) nebulizer solution 1.25 mg  1.25 mg Nebulization TID Tyrone Nine, MD   1.25 mg at 01/29/17 0845  . levothyroxine (SYNTHROID, LEVOTHROID) tablet 50 mcg  50 mcg Oral QAC breakfast Pearson Grippe, MD   50 mcg at 01/29/17 0858  . loratadine (CLARITIN) tablet 10 mg  10 mg Oral Daily Pearson Grippe, MD   10 mg at 01/28/17 1015  . losartan (COZAAR) tablet 50 mg  50 mg Oral BID Pearson Grippe, MD   50 mg at 01/28/17 2138  . ondansetron (ZOFRAN) injection 4 mg  4 mg Intravenous Q6H PRN Pearson Grippe, MD   4 mg at 01/29/17 0221  . pantoprazole (PROTONIX) EC tablet 40 mg  40 mg Oral QAC breakfast Pearson Grippe, MD   40 mg at 01/29/17 0858  . predniSONE (DELTASONE) tablet 10 mg  10 mg Oral Q breakfast Pearson Grippe, MD   10 mg at 01/29/17 0858  . saccharomyces boulardii (FLORASTOR) capsule 250 mg  250 mg Oral BID Tyrone Nine, MD   250 mg at 01/28/17 2138  . sodium chloride HYPERTONIC 3 % nebulizer solution 4 mL  4 mL Nebulization Daily Tyrone Nine, MD   4 mL at 01/29/17 0845     Discharge Medications: Please see discharge summary for a list of discharge medications.  Relevant Imaging Results:  Relevant Lab Results:   Additional Information [id number]:[id number]  Sarah Helling, LCSW

## 2017-01-29 NOTE — Discharge Instructions (Signed)
Dehydration Dehydration is when there is not enough fluid or water in your body. This happens when you lose more fluids than you take in. People who are age 82 or older have a higher risk of getting dehydrated. Dehydration can range from mild to very bad. It should be treated right away to keep it from getting very bad. Symptoms of mild dehydration may include:  Thirst.  Dry lips.  Slightly dry mouth.  Dry, warm skin.  Dizziness. Symptoms of moderate dehydration may include:  Very dry mouth.  Muscle cramps.  Dark pee (urine). Pee may be the color of tea.  Your body making less pee.  Your eyes making fewer tears.  Heartbeat that is uneven or faster than normal (palpitations).  Headache.  Light-headedness, especially when you stand up from sitting.  Fainting (syncope). Symptoms of very bad dehydration may include:  Changes in skin, such as: ? Cold and clammy skin. ? Blotchy (mottled) or pale skin. ? Skin that does not quickly return to normal after being lightly pinched and let go (poor skin turgor).  Changes in body fluids, such as: ? Feeling very thirsty. ? Your eyes making fewer tears. ? Not sweating when body temperature is high, such as in hot weather. ? Your body making very little pee.  Changes in vital signs, such as: ? Weak pulse. ? Pulse that is more than 100 beats a minute when you are sitting still. ? Fast breathing. ? Low blood pressure.  Other changes, such as: ? Sunken eyes. ? Cold hands and feet. ? Confusion. ? Lack of energy (lethargy). ? Trouble waking up from sleep. ? Short-term weight loss. ? Unconsciousness. Follow these instructions at home:  If told by your doctor, drink an ORS: ? Make an ORS by using instructions on the package. ? Start by drinking small amounts, about  cup (120 mL) every 5-10 minutes. ? Slowly drink more until you have had the amount that your doctor said to have.  Drink enough clear fluid to keep your pee  clear or pale yellow. If you were told to drink an ORS, finish the ORS first, then start slowly drinking clear fluids. Drink fluids such as: ? Water. Do not drink only water by itself. Doing that can make the salt (sodium) level in your body get too low (hyponatremia). ? Ice chips. ? Fruit juice that you have added water to (diluted). ? Low-calorie sports drinks.  Avoid: ? Alcohol. ? Drinks that have a lot of sugar. These include high-calorie sports drinks, fruit juice that does not have water added, and soda. ? Caffeine. ? Foods that are greasy or have a lot of fat or sugar.  Take over-the-counter and prescription medicines only as told by your doctor.  Do not take salt tablets. Doing that can make the salt level in your body get too high (hypernatremia).  Eat foods that have minerals (electrolytes). Examples include bananas, oranges, potatoes, tomatoes, and spinach.  Keep all follow-up visits as told by your doctor. This is important. Contact a doctor if:  You have belly (abdominal) pain that: ? Gets worse. ? Stays in one area (localizes).  You have a rash.  You have a stiff neck.  You get angry or annoyed more easily than normal (irritability).  You are more sleepy than normal.  You have a harder time waking up than normal.  You feel: ? Weak. ? Dizzy. ? Very thirsty. Get help right away if:  You have symptoms of very bad dehydration.  You cannot drink fluids without throwing up (vomiting).  Your symptoms get worse with treatment.  You have a fever.  You have a very bad headache.  You are throwing up or having watery poop (diarrhea) and it: ? Gets worse. ? Does not go away.  You have diarrhea for more than 24 hours.  You have blood or something green (bile) in your throw-up.  You have blood in your poop (stool). This may cause poop to look black and tarry.  You have not peed in 6-8 hours.  You have peed (urinated) only a small amount of very dark pee  during 6-8 hours.  You pass out (faint).  Your heart rate when you are sitting still is more than 100 beats a minute.  You have trouble breathing. This information is not intended to replace advice given to you by your health care provider. Make sure you discuss any questions you have with your health care provider. Document Released: 12/20/2010 Document Revised: 07/21/2015 Document Reviewed: 02/24/2015 Elsevier Interactive Patient Education  2018 [employer].   Diarrhea, Adult Diarrhea is when you have loose and water poop (stool) often. Diarrhea can make you feel weak and cause you to get dehydrated. Dehydration can make you tired and thirsty, make you have a dry mouth, and make it so you pee (urinate) less often. Diarrhea often lasts 2-3 days. However, it can last longer if it is a sign of something more serious. It is important to treat your diarrhea as told by your doctor. Follow these instructions at home: Eating and drinking  Follow these recommendations as told by your doctor:  Take an oral rehydration solution (ORS). This is a drink that is sold at pharmacies and stores.  Drink clear fluids, such as: ? Water. ? Ice chips. ? Diluted fruit juice. ? Low-calorie sports drinks.  Eat bland, easy-to-digest foods in small amounts as you are able. These foods include: ? Bananas. ? Applesauce. ? Rice. ? Low-fat (lean) meats. ? Toast. ? Crackers.  Avoid drinking fluids that have a lot of sugar or caffeine in them.  Avoid alcohol.  Avoid spicy or fatty foods.  General instructions   Drink enough fluid to keep your pee (urine) clear or pale yellow.  Wash your hands often. If you cannot use soap and water, use hand sanitizer.  Make sure that all people in your home wash their hands well and often.  Take over-the-counter and prescription medicines only as told by your doctor.  Rest at home while you get better.  Watch your condition for any changes.  Take a warm  bath to help with any burning or pain from having diarrhea.  Keep all follow-up visits as told by your doctor. This is important. Contact a doctor if:  You have a fever.  Your diarrhea gets worse.  You have new symptoms.  You cannot keep fluids down.  You feel light-headed or dizzy.  You have a headache.  You have muscle cramps. Get help right away if:  You have chest pain.  You feel very weak or you pass out (faint).  You have bloody or black poop or poop that look like tar.  You have very bad pain, cramping, or bloating in your belly (abdomen).  You have trouble breathing or you are breathing very quickly.  Your heart is beating very quickly.  Your skin feels cold and clammy.  You feel confused.  You have signs of dehydration, such as: ? Dark pee, hardly any pee,  or no pee. ? Cracked lips. ? Dry mouth. ? Sunken eyes. ? Sleepiness. ? Weakness. This information is not intended to replace advice given to you by your health care provider. Make sure you discuss any questions you have with your health care provider. Document Released: 06/19/2007 Document Revised: 07/21/2015 Document Reviewed: 09/06/2014 Elsevier Interactive Patient Education  2018 [employer]

## 2017-01-29 NOTE — Discharge Summary (Signed)
Discharge Summary  Sarah FlavorsGertrude [name] ZOX:[id number] DOB: February 07, 1921  PCP: Sarah PalmerWolters, [name], MD  Admit date: 01/25/2017 Discharge date: 01/29/2017  Time spent: 25 minutes  Recommendations for Outpatient Follow-up:  1. Follow up with PCP post hospitalization 2. Take your medications as prescribed 3. Continue PT at SNF 4. Fall precaution 5. Contact precaution  Discharge Diagnoses:  Active Hospital Problems   Diagnosis Date Noted  . Gastroenteritis due to norovirus 01/25/2017  . HTN (hypertension) 02/14/2015  . Aneurysm, cerebral, nonruptured 09/09/2014  . Chronic diastolic CHF (congestive heart failure) (HCC) 07/09/2014  . Anxiety 11/23/2011  . Hypothyroidism   . BRONCHIECTASIS 07/29/2008    Resolved Hospital Problems  No resolved problems to display.    Discharge Condition: stable   Diet recommendation: resume prior diet   Vitals:   01/29/17 0845 01/29/17 1326  BP:  (!) 143/84  Pulse:  81  Resp:    Temp:  97.9 F (36.6 C)  SpO2: 94% 93%    History of present illness:  Sarah [name] is a 84 y.o. female with a history of obstructive lung disease, bronchiectasis on chronic steroids and cycling antibiotics, NSTEMI with nonobstructive CAD, GERD with hiatal hernia, and hypothyroidism who was brought to the ED from ILF by her daughter for progressive weakness in the setting of diarrhea, nausea and vomiting over the past few days. Sinus tachycardia was noted on arrival, initial troponin negative, d-dimer checked, found to be positive, subsequent CTA chest showed RLL nodularity, fibrosis, LLL bronchiectatic changes, and a moderate hiatal hernia. CT head showed only chronic small vessel ischemic changes and atrophy. Norovirus positive. Diarrhea has improved and she is taking po. PT recommends short term rehabilitation SNF.  On the day of discharge the patient was hemodynamically stable. Denies abdominal pain or nausea and was tolerating oral intake well. She will need to follow up  with her PCP post hospitalization. She will also need to continue physical therapy for strength built up.   Hospital Course:  Principal Problem:   Gastroenteritis due to norovirus Active Problems:   BRONCHIECTASIS   Hypothyroidism   Anxiety   Chronic diastolic CHF (congestive heart failure) (HCC)   Aneurysm, cerebral, nonruptured   HTN (hypertension)  Norovirus gastroenteritis: Pt is at very elevated risk of complications due to age (turned 84yo last week). - Strict contact precautions.   - Probiotic (on chronic antibiotics) - Taking sufficient intake by mouth, DC IVF's  - no longer nauseous  Weakness: Generalized, previously ambulated independently with cane. Thought to be due to dehydration, diarrhea, etc. as above. CT head without infarct, no focal deficits. - PT ordered, will benefit from short term rehab, prefers [facility]. CSW assistance.  Acute hypoxic respiratory failure, bronchiectasis: Resolved without targeted intervention. CTA chest without PE. Traction bronchiectasis in left lung noted, and nodularity at right base also noted. No fever, leukocytosis, or increase in cough.  - Continue prednisone, no stress dose for now - Bronchodilators, respiratory therapy consulted. Using xopenex with ongoing tachycardia.  - Daily hypertonic saline helping with clearance, but taxing to take so frequently, so decreased to daily.  Nonobstructive CAD, HTN: No chest pain, troponins flat at 0.05 > 0.06 > 0.05, no ischemic ECG findings. No wall motion abnormalities on echocardiogram. TSH 0.522.  - Monitor on home medications including coreg, losartan, HCTZ, plavix  Anxiety: Chronic, exacerbated by hospitalization.  - On xanax BID and clonazepam qHS in addition to duloxetine and lexapro. - Discussed very elevated risk of falling and worsening delirium with benzodiazepines.   Hypothyroidism:  TSH 0.522.  - Continue synthroid  GERD: Chronic, stable.  - PPI  Glaucoma:  - Continue  home gtt's  Nonruptured cerebral aneurysm: Has seen neurology as an outpatient, CT here showed stability.  - Reassurance provided.   Procedures: Echocardiogram 01/26/2017:  - Left ventricle: The cavity size was normal. Wall thickness was increased in a pattern of mild LVH. There was mild focal basal hypertrophy of the septum. Systolic function was vigorous. The estimated ejection fraction was in the range of 65% to 70%. Wall motion was normal; there were no regional wall motion abnormalities. Doppler parameters are consistent with abnormal left ventricular relaxation (grade 1 diastolic dysfunction). - Mitral valve: Calcified annulus. - Left atrium: The atrium was mildly dilated.  Impressions: - Vigorous LV systolic function; mild LVH with proximal septal thickening; mild diastolic dysfunction; mild LAE; mild TR.  Consultations:  None  Discharge Exam: BP (!) 143/84 (BP Location: Left Arm)   Pulse 81   Temp 97.9 F (36.6 C) (Oral)   Resp 18   Ht 5\' 3"  (1.6 m)   Wt 72.1 kg (158 lb 15.2 oz)   LMP 01/14/1970   SpO2 93%   BMI 28.16 kg/m   General: 84 yo CF thin built , NAD A&O x3 Cardiovascular: RRR no rubs or gallops  Respiratory: Mild rales at bases. No use of accessory muscles to breathe. Appears comfortable.  Discharge Instructions You were cared for by a hospitalist during your hospital stay. If you have any questions about your discharge medications or the care you received while you were in the hospital after you are discharged, you can call the unit and asked to speak with the hospitalist on call if the hospitalist that took care of you is not available. Once you are discharged, your primary care physician will handle any further medical issues. Please note that NO REFILLS for any discharge medications will be authorized once you are discharged, as it is imperative that you return to your primary care physician (or establish a relationship with a primary  care physician if you do not have one) for your aftercare needs so that they can reassess your need for medications and monitor your lab values.   Allergies as of 01/29/2017      Reactions   Codeine Anaphylaxis   Hydrocodone Nausea And Vomiting, Other (See Comments)   SYNCOPE AND BRADYCARDIA   Isosorbide Nitrate Other (See Comments)   BRADYCARDIA   Oxycodone Palpitations   Rapid heart beat   Clarithromycin Nausea And Vomiting   Has patient had a PCN reaction causing immediate rash, facial/tongue/throat swelling, SOB or lightheadedness with hypotension: Unknown Has patient had a PCN reaction causing severe rash involving mucus membranes or skin necrosis: Unknown Has patient had a PCN reaction that required hospitalization: Unknown Has patient had a PCN reaction occurring within the last 10 years: Unknown If all of the above answers are "NO", then may proceed with Cephalosporin use.   Fiorinal [butalbital-aspirin-caffeine] Swelling, Other (See Comments)   Facial swelling   Levofloxacin Nausea And Vomiting, Other (See Comments)   SYNCOPE ALSO   Augmentin [amoxicillin-pot Clavulanate] Diarrhea      Medication List    STOP taking these medications   ALPRAZolam 0.5 MG tablet Commonly known as:  XANAX   azithromycin 250 MG tablet Commonly known as:  ZITHROMAX   cefdinir 300 MG capsule Commonly known as:  OMNICEF   clonazePAM 0.5 MG tablet Commonly known as:  KLONOPIN   doxycycline 100 MG tablet Commonly known  as:  VIBRA-TABS   hydrochlorothiazide 25 MG tablet Commonly known as:  HYDRODIURIL     TAKE these medications   atorvastatin 10 MG tablet Commonly known as:  LIPITOR Take 1 tablet (10 mg total) by mouth daily at 6 PM.   calcium carbonate 500 MG chewable tablet Commonly known as:  TUMS - dosed in mg elemental calcium Chew 1 tablet by mouth every 8 (eight) hours as needed for indigestion or heartburn.   carvedilol 6.25 MG tablet Commonly known as:  COREG Take 1  tablet (6.25 mg total) by mouth 2 (two) times daily.   clopidogrel 75 MG tablet Commonly known as:  PLAVIX Take 1 tablet (75 mg total) by mouth daily.   DULoxetine 20 MG capsule Commonly known as:  CYMBALTA Take 20 mg by mouth 3 (three) times daily.   escitalopram 5 MG tablet Commonly known as:  LEXAPRO Take 2.5 mg by mouth daily.   gabapentin 100 MG capsule Commonly known as:  NEURONTIN Take 100 mg by mouth daily.   latanoprost 0.005 % ophthalmic solution Commonly known as:  XALATAN Place 1 drop into both eyes at bedtime.   levalbuterol 1.25 MG/3ML nebulizer solution Commonly known as:  XOPENEX Take 1.25 mg by nebulization 2 (two) times daily. At 1pm and 7pm   levalbuterol 45 MCG/ACT inhaler Commonly known as:  XOPENEX HFA Inhale 1-2 puffs into the lungs every 6 (six) hours as needed for wheezing.   levothyroxine 50 MCG tablet Commonly known as:  SYNTHROID, LEVOTHROID Take 50 mcg by mouth daily.   loratadine 10 MG tablet Commonly known as:  CLARITIN Take 10 mg by mouth daily.   losartan 50 MG tablet Commonly known as:  COZAAR Take 50 mg by mouth 2 (two) times daily.   pantoprazole 40 MG tablet Commonly known as:  PROTONIX Take 1 capsule 1 hour before your first meal of the day   predniSONE 10 MG tablet Commonly known as:  DELTASONE Take 1 tablet (10 mg total) by mouth daily with breakfast.   SENOKOT PO Take 1 tablet by mouth every other day.   sodium chloride HYPERTONIC 3 % nebulizer solution Take by nebulization 2 (two) times daily.   VITAMIN D (CHOLECALCIFEROL) PO Take 2 tablets by mouth daily.      Allergies  Allergen Reactions  . Codeine Anaphylaxis  . Hydrocodone Nausea And Vomiting and Other (See Comments)    SYNCOPE AND BRADYCARDIA  . Isosorbide Nitrate Other (See Comments)    BRADYCARDIA  . Oxycodone Palpitations    Rapid heart beat  . Clarithromycin Nausea And Vomiting    Has patient had a PCN reaction causing immediate rash,  facial/tongue/throat swelling, SOB or lightheadedness with hypotension: Unknown Has patient had a PCN reaction causing severe rash involving mucus membranes or skin necrosis: Unknown Has patient had a PCN reaction that required hospitalization: Unknown Has patient had a PCN reaction occurring within the last 10 years: Unknown If all of the above answers are "NO", then may proceed with Cephalosporin use.   . Fiorinal [Butalbital-Aspirin-Caffeine] Swelling and Other (See Comments)    Facial swelling   . Levofloxacin Nausea And Vomiting and Other (See Comments)    SYNCOPE ALSO  . Augmentin [Amoxicillin-Pot Clavulanate] Diarrhea   Follow-up Information    Sarah Palmer, MD Follow up.   Specialty:  Family Medicine Contact information: [address] [place] [address] [phone]            The results of significant diagnostics  from this hospitalization (including imaging, microbiology, ancillary and laboratory) are listed below for reference.    Significant Diagnostic Studies: Ct Head Wo Contrast  Result Date: 01/25/2017 CLINICAL DATA:  Ataxia and weakness EXAM: CT HEAD WITHOUT CONTRAST TECHNIQUE: Contiguous axial images were obtained from the base of the skull through the vertex without intravenous contrast. COMPARISON:  Head CT 10/06/2014, brain MRI 07/08/2014 FINDINGS: Brain: No acute territorial infarction, hemorrhage, or intracranial mass is visualized. Old left cerebellar infarct. Mild small vessel ischemic changes of the white matter. Mild atrophy. Stable ventricle size. Vascular: No hyperdense vessels. Stable asymmetric soft tissue density at the right cavernous sinus, felt to correspond to the previously noted aneurysm on MRA. Scattered calcifications at the carotid siphons. Skull: No fracture.  Mastoid air cells are clear. Sinuses/Orbits: Fluid levels in the maxillary sinuses with moderate left maxillary sinus mucosal thickening. Mild mucosal thickening  in the ethmoid sinuses. No acute orbital abnormality. Other: None IMPRESSION: 1. No CT evidence for acute intracranial abnormality. Atrophy and small vessel ischemic changes. 2. Bilateral maxillary sinusitis 3. Stable asymmetric soft tissue density at the right cavernous sinus, felt to correspond to previously noted aneurysm on MRA from 2,016. Electronically Signed   By: Jasmine Pang M.D.   On: 01/25/2017 23:33   Ct Angio Chest Pe W Or Wo Contrast  Result Date: 01/26/2017 CLINICAL DATA:  84 year old female with concern for pulmonary embolism. EXAM: CT ANGIOGRAPHY CHEST WITH CONTRAST TECHNIQUE: Multidetector CT imaging of the chest was performed using the standard protocol during bolus administration of intravenous contrast. Multiplanar CT image reconstructions and MIPs were obtained to evaluate the vascular anatomy. CONTRAST:  80mL ISOVUE-370 IOPAMIDOL (ISOVUE-370) INJECTION 76% COMPARISON:  Chest radiograph dated 01/25/2017 FINDINGS: Cardiovascular: There is no cardiomegaly or pericardial effusion. The thoracic aorta is unremarkable. The origins of the great vessels of the aortic arch are patent. No CT evidence of pulmonary embolism. Mediastinum/Nodes: No hilar adenopathy. Mediastinal lymph node measures 12 mm in short axis anterior to the carina. There is a moderate size hiatal hernia containing portion of the stomach. The esophagus is grossly unremarkable. No mediastinal fluid collection. Lungs/Pleura: Chronic parenchymal scarring and traction bronchiectasis in the left lower lobe and lingula. There is associated decreased volume in the left lower lobe. Cluster of nodularity at the right lung base (series 6, image 86) likely chronic. An infectious process is not excluded. Clinical correlation recommended. An area of scarring noted in the right upper lobe along the minor fissure. There is no pleural effusion or pneumothorax. The central airways are patent. Upper Abdomen: No acute abnormality. Musculoskeletal:  Degenerative changes of the spine. No acute osseous pathology. Review of the MIP images confirms the above findings. IMPRESSION: 1. No CT evidence of pulmonary embolism. 2. Chronic fibrosis and bronchiectatic changes in the left lower lobe and lingula. 3. Cluster of nodularity in the right lower lobe may be chronic or represent an infectious process. Clinical correlation is recommended. 4. Moderate size hiatal hernia. Electronically Signed   By: Elgie Collard M.D.   On: 01/26/2017 04:42   Dg Abd Acute W/chest  Result Date: 01/25/2017 CLINICAL DATA:  Nausea and vomiting for several hours EXAM: DG ABDOMEN ACUTE W/ 1V CHEST COMPARISON:  12/10/2016, 12/01/2014 FINDINGS: Cardiac shadow is mildly enlarged. Hiatal hernia is again seen. Stable nodular density is noted in the right upper lobe unchanged from the previous exam as well as previous exams. No focal infiltrate or sizable effusion is seen. Scoliosis concave to the left is noted within the  lower thoracic spine. No free air is identified. Scattered large and small bowel gas is seen. Scoliosis of the lumbar spine is noted. No other focal abnormality is seen. IMPRESSION: Chronic changes without acute abnormality. Electronically Signed   By: Alcide Clever M.D.   On: 01/25/2017 19:08    Microbiology: Recent Results (from the past 240 hour(s))  Gastrointestinal Panel by PCR , Stool     Status: Abnormal   Collection Time: 01/25/17  9:44 PM  Result Value Ref Range Status   Campylobacter species NOT DETECTED NOT DETECTED Final   Plesimonas shigelloides NOT DETECTED NOT DETECTED Final   Salmonella species NOT DETECTED NOT DETECTED Final   Yersinia enterocolitica NOT DETECTED NOT DETECTED Final   Vibrio species NOT DETECTED NOT DETECTED Final   Vibrio cholerae NOT DETECTED NOT DETECTED Final   Enteroaggregative E coli (EAEC) NOT DETECTED NOT DETECTED Final   Enteropathogenic E coli (EPEC) NOT DETECTED NOT DETECTED Final   Enterotoxigenic E coli (ETEC) NOT  DETECTED NOT DETECTED Final   Shiga like toxin producing E coli (STEC) NOT DETECTED NOT DETECTED Final   Shigella/Enteroinvasive E coli (EIEC) NOT DETECTED NOT DETECTED Final   Cryptosporidium NOT DETECTED NOT DETECTED Final   Cyclospora cayetanensis NOT DETECTED NOT DETECTED Final   Entamoeba histolytica NOT DETECTED NOT DETECTED Final   Giardia lamblia NOT DETECTED NOT DETECTED Final   Adenovirus F40/41 NOT DETECTED NOT DETECTED Final   Astrovirus NOT DETECTED NOT DETECTED Final   Norovirus GI/GII DETECTED (A) NOT DETECTED Final    Comment: RESULT CALLED TO, READ BACK BY AND VERIFIED WITH:  CHELSEA YOUNG AT 1234 01/27/17 SDR    Rotavirus A NOT DETECTED NOT DETECTED Final   Sapovirus (I, II, IV, and V) NOT DETECTED NOT DETECTED Final    Comment: Performed at [facility], [address] [place], [place] [address]  C difficile quick scan w PCR reflex     Status: None   Collection Time: 01/25/17  9:44 PM  Result Value Ref Range Status   C Diff antigen NEGATIVE NEGATIVE Final   C Diff toxin NEGATIVE NEGATIVE Final   C Diff interpretation No C. difficile detected.  Final     Labs: Basic Metabolic Panel: Recent Labs  Lab 01/25/17 1643 01/26/17 0647 01/28/17 0630  NA 135 133* 135  K 5.0 3.9 3.8  CL 102 104 105  CO2 24 22 24   GLUCOSE 116* 92 92  BUN 26* 22* 10  CREATININE 1.12* 0.99 0.72  CALCIUM 8.0* 7.2* 7.5*   Liver Function Tests: Recent Labs  Lab 01/25/17 1643 01/25/17 2028 01/26/17 0647  AST 40 40 23  ALT 12* 11* 10*  ALKPHOS 55 54 36*  BILITOT 1.3* 1.4* 0.8  PROT 6.6 6.7 5.1*  ALBUMIN 3.3* 3.4* 2.6*   Recent Labs  Lab 01/25/17 2028  LIPASE 18   No results for input(s): AMMONIA in the last 168 hours. CBC: Recent Labs  Lab 01/25/17 1643 01/26/17 0647  WBC 7.0 6.8  HGB 13.8 12.6  HCT 45.4 40.3  MCV 95.4 94.2  PLT 362 270   Cardiac Enzymes: Recent Labs  Lab 01/25/17 2351 01/26/17 0647 01/26/17 1150  TROPONINI 0.05* 0.06* 0.05*    BNP: BNP (last 3 results) No results for input(s): BNP in the last 8760 hours.  ProBNP (last 3 results) No results for input(s): PROBNP in the last 8760 hours.  CBG: No results for input(s): GLUCAP in the last 168 hours.     Signed:  Darlin Drop, MD [facility] 01/29/2017, 1:28 PM

## 2017-01-29 NOTE — Clinical Social Work Placement (Signed)
   12:20 PM Patient and family chose bed at [facility]  Patient will transport by car. Daughter will be here to pick her up after 3pm.   LCSW confirmed bed with facility.  LCSW faxed dc docs via hub.  RN Report #: [id number]  CLINICAL SOCIAL WORK PLACEMENT  NOTE  Date:  01/29/2017  Patient Details  Name: Sarah [name] MRN: [id number] Date of Birth: [birth year]  Clinical Social Work is seeking post-discharge placement for this patient at the Skilled  Nursing Facility level of care (*CSW will initial, date and re-position this form in  chart as items are completed):  Yes   Patient/family provided with [facility] Clinical Social Work Department's list of facilities offering this level of care within the geographic area requested by the patient (or if unable, by the patient's family).  Yes   Patient/family informed of their freedom to choose among providers that offer the needed level of care, that participate in Medicare, Medicaid or managed care program needed by the patient, have an available bed and are willing to accept the patient.  Yes   Patient/family informed of [facility]'s ownership interest in [facility] and [facility], as well as of the fact that they are under no obligation to receive care at these facilities.  PASRR submitted to EDS on 01/28/17     PASRR number received on       Existing PASRR number confirmed on       FL2 transmitted to all facilities in geographic area requested by pt/family on 01/28/17     FL2 transmitted to all facilities within larger geographic area on       Patient informed that his/her managed care company has contracts with or will negotiate with certain facilities, including the following:        Yes   Patient/family informed of bed offers received.  [facility]     Physician recommends and [facility]    [facility].  Patient to be transferred to facility by Family     Patient family notified on 01/29/17 of transfer.  Name of family member notified:  Sarah [name], Daughter     PHYSICIAN       Additional Comment:    _______________________________________________ Coralyn HellingBernette Anushree Dorsi, LCSW 01/29/2017, 12:20 PM

## 2017-01-30 DIAGNOSIS — J479 Bronchiectasis, uncomplicated: Secondary | ICD-10-CM | POA: Diagnosis not present

## 2017-01-30 DIAGNOSIS — I5032 Chronic diastolic (congestive) heart failure: Secondary | ICD-10-CM | POA: Diagnosis not present

## 2017-01-30 DIAGNOSIS — I2510 Atherosclerotic heart disease of native coronary artery without angina pectoris: Secondary | ICD-10-CM | POA: Diagnosis not present

## 2017-01-30 DIAGNOSIS — A0811 Acute gastroenteropathy due to Norwalk agent: Secondary | ICD-10-CM | POA: Diagnosis not present

## 2017-01-31 DIAGNOSIS — J479 Bronchiectasis, uncomplicated: Secondary | ICD-10-CM | POA: Diagnosis not present

## 2017-01-31 DIAGNOSIS — R112 Nausea with vomiting, unspecified: Secondary | ICD-10-CM | POA: Diagnosis not present

## 2017-01-31 DIAGNOSIS — E46 Unspecified protein-calorie malnutrition: Secondary | ICD-10-CM | POA: Diagnosis not present

## 2017-01-31 DIAGNOSIS — E039 Hypothyroidism, unspecified: Secondary | ICD-10-CM | POA: Diagnosis not present

## 2017-01-31 DIAGNOSIS — I10 Essential (primary) hypertension: Secondary | ICD-10-CM | POA: Diagnosis not present

## 2017-01-31 DIAGNOSIS — F411 Generalized anxiety disorder: Secondary | ICD-10-CM | POA: Diagnosis not present

## 2017-01-31 DIAGNOSIS — R531 Weakness: Secondary | ICD-10-CM | POA: Diagnosis not present

## 2017-01-31 DIAGNOSIS — K529 Noninfective gastroenteritis and colitis, unspecified: Secondary | ICD-10-CM | POA: Diagnosis not present

## 2017-02-05 ENCOUNTER — Ambulatory Visit: Payer: Self-pay | Admitting: Emergency Medicine

## 2017-02-06 DIAGNOSIS — A0811 Acute gastroenteropathy due to Norwalk agent: Secondary | ICD-10-CM | POA: Diagnosis not present

## 2017-02-06 DIAGNOSIS — I10 Essential (primary) hypertension: Secondary | ICD-10-CM | POA: Diagnosis not present

## 2017-02-06 DIAGNOSIS — J479 Bronchiectasis, uncomplicated: Secondary | ICD-10-CM | POA: Diagnosis not present

## 2017-02-06 DIAGNOSIS — I5032 Chronic diastolic (congestive) heart failure: Secondary | ICD-10-CM | POA: Diagnosis not present

## 2017-02-07 DIAGNOSIS — R112 Nausea with vomiting, unspecified: Secondary | ICD-10-CM

## 2017-02-07 DIAGNOSIS — R197 Diarrhea, unspecified: Secondary | ICD-10-CM

## 2017-02-12 ENCOUNTER — Other Ambulatory Visit: Payer: Self-pay | Admitting: *Deleted

## 2017-02-12 NOTE — Patient Outreach (Signed)
[facility]) Care Management  02/12/2017  Sarah [name] October 31, 1921 [id number]   Met with SW at facility, patient to discharge 02/14/17 to [facility].  She will have Home care services.  No [facility] community needs at this time. Royetta Crochet. Laymond Purser, RN, BSN, [place] [phone]) Business Cell  [phone]) Toll Free Office

## 2017-02-13 DIAGNOSIS — J479 Bronchiectasis, uncomplicated: Secondary | ICD-10-CM | POA: Diagnosis not present

## 2017-02-13 DIAGNOSIS — I2510 Atherosclerotic heart disease of native coronary artery without angina pectoris: Secondary | ICD-10-CM | POA: Diagnosis not present

## 2017-02-13 DIAGNOSIS — A0811 Acute gastroenteropathy due to Norwalk agent: Secondary | ICD-10-CM | POA: Diagnosis not present

## 2017-02-13 DIAGNOSIS — I5032 Chronic diastolic (congestive) heart failure: Secondary | ICD-10-CM | POA: Diagnosis not present

## 2017-02-14 ENCOUNTER — Ambulatory Visit: Payer: Self-pay | Admitting: Emergency Medicine

## 2017-02-18 ENCOUNTER — Ambulatory Visit (INDEPENDENT_AMBULATORY_CARE_PROVIDER_SITE_OTHER): Payer: Medicare Other | Admitting: Emergency Medicine

## 2017-02-18 ENCOUNTER — Encounter: Payer: Self-pay | Admitting: Emergency Medicine

## 2017-02-18 DIAGNOSIS — J479 Bronchiectasis, uncomplicated: Secondary | ICD-10-CM

## 2017-02-18 MED ORDER — PREDNISONE 10 MG PO TABS: ORAL_TABLET | ORAL | 0 refills | Status: DC

## 2017-02-18 NOTE — Assessment & Plan Note (Signed)
Evolving an acute flare with increased sputum, diffuse wheezing.  I believe she needs to start her doxycycline now (2 weeks early), take a prednisone taper with plans to work back down to 10 mg daily.  We can then adjust the start time of her next antibiotic to March 5.  Please take prednisone as directed until completely gone, then back to 10mg  daily Start your doxycycline prescription now.  Call our office this week to report on whether you are doing better.  Use your xopenex nebulizer twice twice a day Get your chest vest as planned.  Follow with Dr Delton CoombesByrum in 1 month

## 2017-02-18 NOTE — Patient Instructions (Signed)
Please take prednisone as directed until completely gone, then back to 10mg  daily Start your doxycycline prescription now.  Call our office this week to report on whether you are doing better.  Use your xopenex nebulizer twice twice a day Get your chest vest as planned.  Follow with Dr Delton CoombesByrum in 1 month

## 2017-02-18 NOTE — Progress Notes (Signed)
   Subjective:    Patient ID: Sarah [name], female    DOB: 30-Jul-1921, 84 y.o.   MRN: [id number]  HPI  ROV 02/18/17 --pleasant 84 year old woman with a history of bronchiectasis with obstructive lung disease, asthmatic bronchitis.  She also has chronic cough and upper airway irritation syndrome. We have been managing her on scheduled chronic prednisone 10 mg daily.  Also on rotating antibiotics.  Her bronchodilators include scheduled Xopenex .  She is using loratadine, Protonix, calcium carbonate.  She was recently admitted for norovirus, had to [facility]. She just got back home on Sunday.   Her cough and dyspnea were better for about a month. She is now bringing up more mucous, having more SOB. Mucous is light tan.  No fevers. She finished azithro at [facility], is not supposed to restart an abx until 2/20. Not on mucinex. She is supposed to start chest vest this week.     Review of Systems As above       Objective:   Physical Exam  Vitals:   02/18/17 1637  BP: 120/80  Pulse: (!) 108  SpO2: 93%  Weight: 138 lb (62.6 kg)  Height: 5' 6.5" (1.689 m)    GEN: A/Ox3; pleasant , NAD, elderly and thin   HEENT:  Op clear,   NECK:  No stridor  RESP bilateral diffuse wheezing, coarse  CARD:  RRR, no m/r/g  , no peripheral edema, pulses intact, no cyanosis or clubbing.  Musco: Warm bil, no deformities or joint swelling noted.   Neuro: alert, no focal deficits noted.    Skin: Warm, no lesions or rashes      Assessment & Plan:  BRONCHIECTASIS Evolving an acute flare with increased sputum, diffuse wheezing.  I believe she needs to start her doxycycline now (2 weeks early), take a prednisone taper with plans to work back down to 10 mg daily.  We can then adjust the start time of her next antibiotic to March 5.  Please take prednisone as directed until completely gone, then back to 10mg  daily Start your doxycycline prescription now.  Call our office this week to  report on whether you are doing better.  Use your xopenex nebulizer twice twice a day Get your chest vest as planned.  Follow with Dr Delton CoombesByrum in 1 month  Levy Pupaobert Reganne Messerschmidt, MD, PhD 02/18/2017, 5:21 PM [facility] [phone] or if no answer [phone]

## 2017-02-21 ENCOUNTER — Telehealth: Payer: Self-pay | Admitting: Emergency Medicine

## 2017-02-21 NOTE — Telephone Encounter (Signed)
Pt's daughter Sharyl NimrodMeredith calling to report that pt is tolerating meds and chest vest well.  I advised Sharyl NimrodMeredith to call our office if pt's status changes at all.  [name] expressed understanding.  Nothing further needed.

## 2017-02-24 DIAGNOSIS — M6281 Muscle weakness (generalized): Secondary | ICD-10-CM | POA: Diagnosis not present

## 2017-02-24 DIAGNOSIS — F410 Panic disorder [episodic paroxysmal anxiety] without agoraphobia: Secondary | ICD-10-CM | POA: Diagnosis not present

## 2017-02-24 DIAGNOSIS — R293 Abnormal posture: Secondary | ICD-10-CM | POA: Diagnosis not present

## 2017-02-24 DIAGNOSIS — R2681 Unsteadiness on feet: Secondary | ICD-10-CM | POA: Diagnosis not present

## 2017-02-24 DIAGNOSIS — F411 Generalized anxiety disorder: Secondary | ICD-10-CM | POA: Diagnosis not present

## 2017-02-24 DIAGNOSIS — R2689 Other abnormalities of gait and mobility: Secondary | ICD-10-CM | POA: Diagnosis not present

## 2017-02-24 DIAGNOSIS — E640 Sequelae of protein-calorie malnutrition: Secondary | ICD-10-CM | POA: Diagnosis not present

## 2017-02-24 DIAGNOSIS — R278 Other lack of coordination: Secondary | ICD-10-CM | POA: Diagnosis not present

## 2017-02-24 DIAGNOSIS — F339 Major depressive disorder, recurrent, unspecified: Secondary | ICD-10-CM | POA: Diagnosis not present

## 2017-02-24 DIAGNOSIS — N393 Stress incontinence (female) (male): Secondary | ICD-10-CM | POA: Diagnosis not present

## 2017-02-24 DIAGNOSIS — J471 Bronchiectasis with (acute) exacerbation: Secondary | ICD-10-CM | POA: Diagnosis not present

## 2017-02-24 DIAGNOSIS — R5381 Other malaise: Secondary | ICD-10-CM | POA: Diagnosis not present

## 2017-02-25 ENCOUNTER — Ambulatory Visit (INDEPENDENT_AMBULATORY_CARE_PROVIDER_SITE_OTHER): Payer: Medicare Other | Admitting: Psychiatry

## 2017-02-25 DIAGNOSIS — R2681 Unsteadiness on feet: Secondary | ICD-10-CM | POA: Diagnosis not present

## 2017-02-25 DIAGNOSIS — R278 Other lack of coordination: Secondary | ICD-10-CM | POA: Diagnosis not present

## 2017-02-25 DIAGNOSIS — R293 Abnormal posture: Secondary | ICD-10-CM | POA: Diagnosis not present

## 2017-02-25 DIAGNOSIS — F4322 Adjustment disorder with anxiety: Secondary | ICD-10-CM

## 2017-02-25 DIAGNOSIS — N393 Stress incontinence (female) (male): Secondary | ICD-10-CM | POA: Diagnosis not present

## 2017-02-25 DIAGNOSIS — M6281 Muscle weakness (generalized): Secondary | ICD-10-CM | POA: Diagnosis not present

## 2017-02-25 DIAGNOSIS — R2689 Other abnormalities of gait and mobility: Secondary | ICD-10-CM | POA: Diagnosis not present

## 2017-02-28 DIAGNOSIS — R278 Other lack of coordination: Secondary | ICD-10-CM | POA: Diagnosis not present

## 2017-02-28 DIAGNOSIS — N393 Stress incontinence (female) (male): Secondary | ICD-10-CM | POA: Diagnosis not present

## 2017-02-28 DIAGNOSIS — R293 Abnormal posture: Secondary | ICD-10-CM | POA: Diagnosis not present

## 2017-02-28 DIAGNOSIS — R2681 Unsteadiness on feet: Secondary | ICD-10-CM | POA: Diagnosis not present

## 2017-02-28 DIAGNOSIS — R2689 Other abnormalities of gait and mobility: Secondary | ICD-10-CM | POA: Diagnosis not present

## 2017-02-28 DIAGNOSIS — M6281 Muscle weakness (generalized): Secondary | ICD-10-CM | POA: Diagnosis not present

## 2017-03-04 DIAGNOSIS — R2689 Other abnormalities of gait and mobility: Secondary | ICD-10-CM | POA: Diagnosis not present

## 2017-03-04 DIAGNOSIS — M6281 Muscle weakness (generalized): Secondary | ICD-10-CM | POA: Diagnosis not present

## 2017-03-04 DIAGNOSIS — R293 Abnormal posture: Secondary | ICD-10-CM | POA: Diagnosis not present

## 2017-03-04 DIAGNOSIS — R278 Other lack of coordination: Secondary | ICD-10-CM | POA: Diagnosis not present

## 2017-03-04 DIAGNOSIS — R2681 Unsteadiness on feet: Secondary | ICD-10-CM | POA: Diagnosis not present

## 2017-03-04 DIAGNOSIS — N393 Stress incontinence (female) (male): Secondary | ICD-10-CM | POA: Diagnosis not present

## 2017-03-06 DIAGNOSIS — N393 Stress incontinence (female) (male): Secondary | ICD-10-CM | POA: Diagnosis not present

## 2017-03-06 DIAGNOSIS — R2681 Unsteadiness on feet: Secondary | ICD-10-CM | POA: Diagnosis not present

## 2017-03-06 DIAGNOSIS — R278 Other lack of coordination: Secondary | ICD-10-CM | POA: Diagnosis not present

## 2017-03-06 DIAGNOSIS — R293 Abnormal posture: Secondary | ICD-10-CM | POA: Diagnosis not present

## 2017-03-06 DIAGNOSIS — M6281 Muscle weakness (generalized): Secondary | ICD-10-CM | POA: Diagnosis not present

## 2017-03-06 DIAGNOSIS — R2689 Other abnormalities of gait and mobility: Secondary | ICD-10-CM | POA: Diagnosis not present

## 2017-03-10 DIAGNOSIS — M6281 Muscle weakness (generalized): Secondary | ICD-10-CM | POA: Diagnosis not present

## 2017-03-10 DIAGNOSIS — R278 Other lack of coordination: Secondary | ICD-10-CM | POA: Diagnosis not present

## 2017-03-10 DIAGNOSIS — R293 Abnormal posture: Secondary | ICD-10-CM | POA: Diagnosis not present

## 2017-03-10 DIAGNOSIS — R2681 Unsteadiness on feet: Secondary | ICD-10-CM | POA: Diagnosis not present

## 2017-03-10 DIAGNOSIS — R2689 Other abnormalities of gait and mobility: Secondary | ICD-10-CM | POA: Diagnosis not present

## 2017-03-10 DIAGNOSIS — N393 Stress incontinence (female) (male): Secondary | ICD-10-CM | POA: Diagnosis not present

## 2017-03-12 DIAGNOSIS — R293 Abnormal posture: Secondary | ICD-10-CM | POA: Diagnosis not present

## 2017-03-12 DIAGNOSIS — M6281 Muscle weakness (generalized): Secondary | ICD-10-CM | POA: Diagnosis not present

## 2017-03-12 DIAGNOSIS — R278 Other lack of coordination: Secondary | ICD-10-CM | POA: Diagnosis not present

## 2017-03-12 DIAGNOSIS — R2681 Unsteadiness on feet: Secondary | ICD-10-CM | POA: Diagnosis not present

## 2017-03-12 DIAGNOSIS — R2689 Other abnormalities of gait and mobility: Secondary | ICD-10-CM | POA: Diagnosis not present

## 2017-03-12 DIAGNOSIS — N393 Stress incontinence (female) (male): Secondary | ICD-10-CM | POA: Diagnosis not present

## 2017-03-14 ENCOUNTER — Other Ambulatory Visit: Payer: Self-pay | Admitting: Cardiovascular Disease

## 2017-03-14 DIAGNOSIS — M6281 Muscle weakness (generalized): Secondary | ICD-10-CM | POA: Diagnosis not present

## 2017-03-14 DIAGNOSIS — R293 Abnormal posture: Secondary | ICD-10-CM | POA: Diagnosis not present

## 2017-03-14 DIAGNOSIS — R2681 Unsteadiness on feet: Secondary | ICD-10-CM | POA: Diagnosis not present

## 2017-03-14 DIAGNOSIS — R2689 Other abnormalities of gait and mobility: Secondary | ICD-10-CM | POA: Diagnosis not present

## 2017-03-17 DIAGNOSIS — R2689 Other abnormalities of gait and mobility: Secondary | ICD-10-CM | POA: Diagnosis not present

## 2017-03-17 DIAGNOSIS — R2681 Unsteadiness on feet: Secondary | ICD-10-CM | POA: Diagnosis not present

## 2017-03-17 DIAGNOSIS — R293 Abnormal posture: Secondary | ICD-10-CM | POA: Diagnosis not present

## 2017-03-17 DIAGNOSIS — M6281 Muscle weakness (generalized): Secondary | ICD-10-CM | POA: Diagnosis not present

## 2017-03-18 DIAGNOSIS — H353213 Exudative age-related macular degeneration, right eye, with inactive scar: Secondary | ICD-10-CM | POA: Diagnosis not present

## 2017-03-18 DIAGNOSIS — H43813 Vitreous degeneration, bilateral: Secondary | ICD-10-CM | POA: Diagnosis not present

## 2017-03-18 DIAGNOSIS — H353122 Nonexudative age-related macular degeneration, left eye, intermediate dry stage: Secondary | ICD-10-CM | POA: Diagnosis not present

## 2017-03-19 ENCOUNTER — Ambulatory Visit (INDEPENDENT_AMBULATORY_CARE_PROVIDER_SITE_OTHER): Payer: Medicare Other | Admitting: Emergency Medicine

## 2017-03-19 ENCOUNTER — Encounter: Payer: Self-pay | Admitting: Emergency Medicine

## 2017-03-19 DIAGNOSIS — M6281 Muscle weakness (generalized): Secondary | ICD-10-CM | POA: Diagnosis not present

## 2017-03-19 DIAGNOSIS — J301 Allergic rhinitis due to pollen: Secondary | ICD-10-CM | POA: Diagnosis not present

## 2017-03-19 DIAGNOSIS — R05 Cough: Secondary | ICD-10-CM | POA: Diagnosis not present

## 2017-03-19 DIAGNOSIS — J4521 Mild intermittent asthma with (acute) exacerbation: Secondary | ICD-10-CM

## 2017-03-19 DIAGNOSIS — R2689 Other abnormalities of gait and mobility: Secondary | ICD-10-CM | POA: Diagnosis not present

## 2017-03-19 DIAGNOSIS — J479 Bronchiectasis, uncomplicated: Secondary | ICD-10-CM

## 2017-03-19 DIAGNOSIS — R2681 Unsteadiness on feet: Secondary | ICD-10-CM | POA: Diagnosis not present

## 2017-03-19 DIAGNOSIS — R293 Abnormal posture: Secondary | ICD-10-CM | POA: Diagnosis not present

## 2017-03-19 DIAGNOSIS — K219 Gastro-esophageal reflux disease without esophagitis: Secondary | ICD-10-CM

## 2017-03-19 DIAGNOSIS — R059 Cough, unspecified: Secondary | ICD-10-CM

## 2017-03-19 NOTE — Patient Instructions (Addendum)
Please continue your rotating antibiotics as we have ordered them Continue prednisone 10 mg daily. You your chest vest 1-2 times a day.  You may find it beneficial to use it between 4 and 6:00 PM if this is the time of day when you need to clear your secretions. Continue levalbuterol (Xopenex) twice a day. Continue Claritin and Protonix  Follow with Dr Delton CoombesByrum in 3 months or sooner if you have any problems.

## 2017-03-19 NOTE — Progress Notes (Signed)
Subjective:    Patient ID: Sarah [name], female    DOB: Jul 16, 1921, 84 y.o.   MRN: [id number]  HPI  ROV 02/18/17 --pleasant 84 year old woman with a history of bronchiectasis with obstructive lung disease, asthmatic bronchitis.  She also has chronic cough and upper airway irritation syndrome. We have been managing her on scheduled chronic prednisone 10 mg daily.  Also on rotating antibiotics.  Her bronchodilators include scheduled Xopenex .  She is using loratadine, Protonix, calcium carbonate.  She was recently admitted for norovirus, had to go to [place] for rehab. She just got back home on Sunday.   Her cough and dyspnea were better for about a month. She is now bringing up more mucous, having more SOB. Mucous is light tan.  No fevers. She finished azithro at [facility], is not supposed to restart an abx until 2/20. Not on mucinex. She is supposed to start chest vest this week.   ROV 03/19/17 --Sarah [name] is 84 and has a history of chronic cough.  This in the setting of bronchiectasis, associated asthmatic bronchitis and upper airway irritation syndrome.  We have managed her with rotating antibiotics (cefdinir, doxycycline, azithro) and chronic prednisone 10 mg daily.  At her last visit in the setting of a flare we had her take her doxycycline early, gave a prednisone taper back down to her usual 10 mg and added chest vest percussion to her regimen. She is doing bid. She has clinically improved. Much less mucous. She had a single episode of a chill since last time, but then no other sx.                                                                                                                                                                                                                                                                                              Review of Systems As above       Objective:   Physical Exam  Vitals:   03/19/17 1332  BP: 132/90  Pulse: 93  SpO2: 94%    Weight: 138 lb (62.6 kg)    GEN: A/Ox3; pleasant ,  NAD, elderly and thin   HEENT:  Op clear,   NECK:  No stridor  RESP distant, clear bilaterally, no crackles or wheezes  CARD:  RRR, no m/r/g  , no peripheral edema, pulses intact, no cyanosis or clubbing.  Musco: Warm bil, no deformities or joint swelling noted.   Neuro: alert, no focal deficits noted.    Skin: Warm, no lesions or rashes      Assessment & Plan:  Cough Multifactorial as detailed above.  BRONCHIECTASIS She is on a good regimen of rotating antibiotics and chest vest.  She requires bronchodilator and chronic prednisone, stable dosing.  She is resolved from her recent exacerbation  Asthma Continue Xopenex twice a day, prednisone as ordered  Allergic rhinitis Continue loratadine  GERD (gastroesophageal reflux disease) Continue protonix  Levy Pupaobert Yatzil Clippinger, MD, PhD 03/19/2017, 1:50 PM [facility] [phone] or if no answer [phone]

## 2017-03-19 NOTE — Assessment & Plan Note (Signed)
Continue Xopenex twice a day, prednisone as ordered

## 2017-03-19 NOTE — Assessment & Plan Note (Signed)
Multifactorial as detailed above.

## 2017-03-19 NOTE — Assessment & Plan Note (Signed)
She is on a good regimen of rotating antibiotics and chest vest.  She requires bronchodilator and chronic prednisone, stable dosing.  She is resolved from her recent exacerbation

## 2017-03-19 NOTE — Assessment & Plan Note (Signed)
Continue loratadine 

## 2017-03-19 NOTE — Assessment & Plan Note (Signed)
Continue protonix  

## 2017-03-20 DIAGNOSIS — R2681 Unsteadiness on feet: Secondary | ICD-10-CM | POA: Diagnosis not present

## 2017-03-20 DIAGNOSIS — R2689 Other abnormalities of gait and mobility: Secondary | ICD-10-CM | POA: Diagnosis not present

## 2017-03-20 DIAGNOSIS — R293 Abnormal posture: Secondary | ICD-10-CM | POA: Diagnosis not present

## 2017-03-20 DIAGNOSIS — M6281 Muscle weakness (generalized): Secondary | ICD-10-CM | POA: Diagnosis not present

## 2017-03-21 ENCOUNTER — Telehealth: Payer: Self-pay | Admitting: *Deleted

## 2017-03-21 DIAGNOSIS — I10 Essential (primary) hypertension: Secondary | ICD-10-CM

## 2017-03-21 NOTE — Telephone Encounter (Signed)
Patient's daughter is calling to see if the hospital 'made a mistake' by taking the patient off of HCTZ back in January at her discharge. Patient was seen for dehydration and a virus and her daughter feels she was dehydrated due to the Virus and wants to know if Dr. Elease HashimotoNahser thinks this ok.  Please advise.

## 2017-03-24 MED ORDER — HYDROCHLOROTHIAZIDE 12.5 MG PO CAPS: 12.5000 mg | ORAL_CAPSULE | Freq: Every day | ORAL | 3 refills | Status: DC

## 2017-03-24 NOTE — Telephone Encounter (Signed)
Holding HCTZ given her admission for dehydration would be appropriate.

## 2017-03-24 NOTE — Telephone Encounter (Signed)
Spoke with patient's daughter, Sarah [name], to review Dr. Harvie BridgeNahser's advice. She states the patient actually never stopped the HCTZ and the way that they discovered this is because [facility] called us and we denied the refill. Patient's daughter states she is not aware of the patient having symptoms of dehydration or low blood pressure. She states the patient tolerates physical therapy without difficulty. Daughter states the patient has not had lab work recently so I scheduled her for lab appointment tomorrow 3/12. I advised that since the patient has remained on HCTZ 12.5 mg that we will refill that Rx and that if lab work indicates a need to change our plan I will call her back to discuss. Sarah NimrodMeredith verbalized understanding and agreement with plan and thanked me for the call.

## 2017-03-25 ENCOUNTER — Telehealth: Payer: Self-pay

## 2017-03-25 ENCOUNTER — Other Ambulatory Visit: Payer: Medicare Other

## 2017-03-25 DIAGNOSIS — R2681 Unsteadiness on feet: Secondary | ICD-10-CM | POA: Diagnosis not present

## 2017-03-25 DIAGNOSIS — R2689 Other abnormalities of gait and mobility: Secondary | ICD-10-CM | POA: Diagnosis not present

## 2017-03-25 DIAGNOSIS — I10 Essential (primary) hypertension: Secondary | ICD-10-CM

## 2017-03-25 DIAGNOSIS — M6281 Muscle weakness (generalized): Secondary | ICD-10-CM | POA: Diagnosis not present

## 2017-03-25 DIAGNOSIS — R293 Abnormal posture: Secondary | ICD-10-CM | POA: Diagnosis not present

## 2017-03-25 LAB — BASIC METABOLIC PANEL
BUN/Creatinine Ratio: 14 (ref 12–28)
BUN: 14 mg/dL (ref 10–36)
CO2: 29 mmol/L (ref 20–29)
CREATININE: 0.97 mg/dL (ref 0.57–1.00)
Calcium: 9.1 mg/dL (ref 8.7–10.3)
Chloride: 96 mmol/L (ref 96–106)
GFR calc Af Amer: 58 mL/min/{1.73_m2} — ABNORMAL LOW (ref >59)
GFR calc non Af Amer: 50 mL/min/{1.73_m2} — ABNORMAL LOW (ref >59)
GLUCOSE: 111 mg/dL — AB (ref 65–99)
Potassium: 4.6 mmol/L (ref 3.5–5.2)
Sodium: 139 mmol/L (ref 134–144)

## 2017-03-25 MED ORDER — HYDROCHLOROTHIAZIDE 12.5 MG PO CAPS: 12.5000 mg | ORAL_CAPSULE | Freq: Every day | ORAL | 0 refills | Status: DC

## 2017-03-25 NOTE — Telephone Encounter (Signed)
Spoke with patient's daughter [name] (DPR on File) and made her aware of lab results. Patient to continue current meds. Daughter verbalizes understanding. Refill sent for HCTZ 12.5 mg daily to [facility] on [address].   Notes recorded by Nahser, Philip J, MD on 03/25/2017 at 3:49 PM EDT BMP is stable    

## 2017-03-25 NOTE — Telephone Encounter (Signed)
Spoke with patient's daughter Sharyl NimrodMeredith (DPR on File) and made her aware of lab results. Patient to continue current meds. Daughter verbalizes understanding. [facility] [address].   Notes recorded by Nahser, Deloris PingPhilip J, MD on 03/25/2017 at 3:49 PM EDT BMP is stable

## 2017-03-27 DIAGNOSIS — R2689 Other abnormalities of gait and mobility: Secondary | ICD-10-CM | POA: Diagnosis not present

## 2017-03-27 DIAGNOSIS — R293 Abnormal posture: Secondary | ICD-10-CM | POA: Diagnosis not present

## 2017-03-27 DIAGNOSIS — R2681 Unsteadiness on feet: Secondary | ICD-10-CM | POA: Diagnosis not present

## 2017-03-27 DIAGNOSIS — M6281 Muscle weakness (generalized): Secondary | ICD-10-CM | POA: Diagnosis not present

## 2017-03-28 DIAGNOSIS — M6281 Muscle weakness (generalized): Secondary | ICD-10-CM | POA: Diagnosis not present

## 2017-03-28 DIAGNOSIS — R293 Abnormal posture: Secondary | ICD-10-CM | POA: Diagnosis not present

## 2017-03-28 DIAGNOSIS — R2681 Unsteadiness on feet: Secondary | ICD-10-CM | POA: Diagnosis not present

## 2017-03-28 DIAGNOSIS — R2689 Other abnormalities of gait and mobility: Secondary | ICD-10-CM | POA: Diagnosis not present

## 2017-04-01 DIAGNOSIS — M6281 Muscle weakness (generalized): Secondary | ICD-10-CM | POA: Diagnosis not present

## 2017-04-01 DIAGNOSIS — R2681 Unsteadiness on feet: Secondary | ICD-10-CM | POA: Diagnosis not present

## 2017-04-01 DIAGNOSIS — R2689 Other abnormalities of gait and mobility: Secondary | ICD-10-CM | POA: Diagnosis not present

## 2017-04-01 DIAGNOSIS — R293 Abnormal posture: Secondary | ICD-10-CM | POA: Diagnosis not present

## 2017-04-02 DIAGNOSIS — R293 Abnormal posture: Secondary | ICD-10-CM | POA: Diagnosis not present

## 2017-04-02 DIAGNOSIS — R2681 Unsteadiness on feet: Secondary | ICD-10-CM | POA: Diagnosis not present

## 2017-04-02 DIAGNOSIS — R2689 Other abnormalities of gait and mobility: Secondary | ICD-10-CM | POA: Diagnosis not present

## 2017-04-02 DIAGNOSIS — M6281 Muscle weakness (generalized): Secondary | ICD-10-CM | POA: Diagnosis not present

## 2017-04-03 ENCOUNTER — Ambulatory Visit (INDEPENDENT_AMBULATORY_CARE_PROVIDER_SITE_OTHER): Payer: Medicare Other | Admitting: Psychiatry

## 2017-04-03 DIAGNOSIS — F4322 Adjustment disorder with anxiety: Secondary | ICD-10-CM | POA: Diagnosis not present

## 2017-04-03 DIAGNOSIS — F410 Panic disorder [episodic paroxysmal anxiety] without agoraphobia: Secondary | ICD-10-CM | POA: Diagnosis not present

## 2017-04-04 DIAGNOSIS — R293 Abnormal posture: Secondary | ICD-10-CM | POA: Diagnosis not present

## 2017-04-04 DIAGNOSIS — R2689 Other abnormalities of gait and mobility: Secondary | ICD-10-CM | POA: Diagnosis not present

## 2017-04-04 DIAGNOSIS — M6281 Muscle weakness (generalized): Secondary | ICD-10-CM | POA: Diagnosis not present

## 2017-04-04 DIAGNOSIS — R2681 Unsteadiness on feet: Secondary | ICD-10-CM | POA: Diagnosis not present

## 2017-05-07 ENCOUNTER — Telehealth: Payer: Self-pay | Admitting: Emergency Medicine

## 2017-05-07 NOTE — Telephone Encounter (Signed)
Patient's daughter asked we call back after 2:30 pm.

## 2017-05-07 NOTE — Telephone Encounter (Signed)
Called spoke with patient's daughter Sharyl NimrodMeredith Pt is on Xopenex HFA and nebulizer and received notice from [employer] that [employer]'s insurance is no longer going to cover the [employer]openex  [name] would like to know if we can switch patient to Albuterol - states she has discussed with patient and patient is okay with switching as well.  Also stated that it had been discussed previously to switch to Albuterol but they all decided against it to avoid the hassle but since this has come up, Sharyl NimrodMeredith would like to go ahead.  RB is on vacation but Sharyl NimrodMeredith would like this taken care of before Friday as patient does not have enough Xopenex to last through the weekend.    Dr Craige CottaSood, would you be okay to weigh in on this?  Thank you.  Last ov 3.6.19 w/ RB Upcoming ov 6.10.19

## 2017-05-07 NOTE — Telephone Encounter (Signed)
Per VS: okay to switch to Albuterol neb 0.083% 1 vial twice daily and Proair HFA 1-2 puffs every 6 hours as needed.  Called spoke with patient's daughter Sharyl NimrodMeredith to discuss Sharyl NimrodMeredith happy with this - she offered the information on the notice she received [facility] >> P [phone] Jorge NyMalika Price, F [phone]  [facility], the extention provided is for [facility] and spoke with Raynelle FanningJulie in the pharmacy dept - she stated that I needed to speak with someone in billing because of patient's insurance and transferred me.  Was on hold x24 minutes   Will call back tomorrow Again, this needs to be taken care of before the weekend d/t patient's current Xopenex supply

## 2017-05-08 MED ORDER — ALBUTEROL SULFATE HFA 108 (90 BASE) MCG/ACT IN AERS: 1.0000 | INHALATION_SPRAY | Freq: Four times a day (QID) | RESPIRATORY_TRACT | 6 refills | Status: DC | PRN

## 2017-05-08 MED ORDER — ALBUTEROL SULFATE (2.5 MG/3ML) 0.083% IN NEBU: 2.5000 mg | INHALATION_SOLUTION | Freq: Two times a day (BID) | RESPIRATORY_TRACT | 12 refills | Status: DC

## 2017-05-08 NOTE — Telephone Encounter (Signed)
Called and spoke to [place] at [facility]. She stated that the Xopenex not being covered was the only issues and that Albuterol neb was preferred.  Called and spoke to patient's daughter, Sarah [name]. Advised her that we had already spoken to [facility] and per VS we had new orders. Sarah [name] advised that she would like to continue to [facility]. Orders placed per VS. Nothing further needed at this time.

## 2017-05-27 ENCOUNTER — Encounter: Payer: Self-pay | Admitting: Cardiovascular Disease

## 2017-05-27 ENCOUNTER — Ambulatory Visit (INDEPENDENT_AMBULATORY_CARE_PROVIDER_SITE_OTHER): Payer: Medicare Other | Admitting: Cardiovascular Disease

## 2017-05-27 VITALS — BP 108/68 | HR 113 | Ht 67.6 in | Wt 148.8 lb

## 2017-05-27 DIAGNOSIS — I5032 Chronic diastolic (congestive) heart failure: Secondary | ICD-10-CM | POA: Diagnosis not present

## 2017-05-27 NOTE — Progress Notes (Addendum)
Sarah [name] Date of Birth: Apr 03, 1921 Medical Record #[id number]  Problem list: 1. Hypertension 2. Asthma 3. Depression 4. Coronary artery disease-status post non-ST segment elevation myocardial infarction. Cardiac cath revealed nonobstructive coronary artery disease 5. Bronchiectasis / atypical mycobacterium infection   History of Present Illness: [name] is seen back today following a recent hospitalization. She was admitted to the hospital with chest tightness. She ruled out for myocardial infarction. She has a history of chest pain in the past and a cardiac catheterization in 2012 was unremarkable.  She originally called EMS when her BP was 220/110  She has had significant difficulty controlling her BP recently.   She has been watching her salt.  She has taken amlodipine and Lisinopril in the past but these have been stopped because she seemed to be overmedicated.    She continues to have intermittent episodes of chest pressure and heaviness.  These are typically associated with marked hypertension although it is not clear whether the hypertension occurs first or as a result of her  feeling poorly.  Jun 11, 2012:  Sarah [name] was seen by Truitt Merle in February, 2014. She had been measuring her blood pressure at least 10 times every day and was worried because her blood pressure seemed to be somewhat labile.  She feels poorly today.  She did not sleep well last night.  She had a headache.   She has lots of anxiety issues.  She takes xanax with some relief.    June 2 , 2015:  Sarah [name] is doing ok.  Upset about having to put her husband in an memory care SNF.  Lots of guilt about that.    Has a home sitter with her today. No significant CP or dyspnea.  She has some anxiety - has attacks on occasion.  Her anxiety seems to start in her chest as a CP.  If she takes the xanax and Tylenol #3 in time, she can avoid the panic attacks.    She thinks she had 3 TIAs this past year.   Carotid duplex was negative.  MRI was normal.    She has been on 1 ASA 325 mg a day.   Jan. 31, 2017:  Patient is ding well.   BP and HR look great today Still short of breath .  Has pulmonary issues.   Sees [name] .   Jan. 30, 2018:    Doing well from a cardiac .    Just getting over an episode of cellulitis and lymphedema .  No further CP   BP has been a little high.  She thinks she may have had a TIA on Saturday .    She felt strange in her head,  Could not speak,  Had double vision,  Took her BP - 199/110  May 27, 2017:  Sarah [name] is seen today for follow-up visit.  She has a history of coronary artery disease. She had a TIA earlier this year.  Echocardiogram in January, 2019 reveals a vigorous left ventricular systolic function with EF of 65 to 70%.  She has grade 1 diastolic dysfunction.  No CP . Has COPD  HR is a little fast but over all is doing well   Current Outpatient Medications on File Prior to Visit  Medication Sig Dispense Refill  . albuterol (PROVENTIL HFA;VENTOLIN HFA) 108 (90 Base) MCG/ACT inhaler Inhale 1-2 puffs into the lungs every 6 (six) hours as needed for wheezing or shortness of breath. 1 Inhaler  6  . albuterol (PROVENTIL) (2.5 MG/3ML) 0.083% nebulizer solution Take 3 mLs (2.5 mg total) by nebulization 2 (two) times daily. Take 1 vial twice a day. 75 mL 12  . ALPRAZolam (XANAX) 0.5 MG tablet Take 0.5 mg by mouth 2 (two) times daily.    . calcium carbonate (TUMS - DOSED IN MG ELEMENTAL CALCIUM) 500 MG chewable tablet Chew 1 tablet by mouth every 8 (eight) hours as needed for indigestion or heartburn.    . carvedilol (COREG) 6.25 MG tablet Take 1 tablet (6.25 mg total) by mouth 2 (two) times daily. 180 tablet 3  . clonazePAM (KLONOPIN) 0.5 MG tablet Take 0.5 mg by mouth 2 (two) times daily.    . clopidogrel (PLAVIX) 75 MG tablet Take 1 tablet (75 mg total) by mouth daily. 30 tablet 1  . DULoxetine (CYMBALTA) 20 MG capsule Take 20 mg by mouth 3 (three)  times daily.    Marland Kitchen escitalopram (LEXAPRO) 5 MG tablet Take 2.5 mg by mouth daily.    Marland Kitchen gabapentin (NEURONTIN) 100 MG capsule Take 100 mg by mouth daily.    . hydrochlorothiazide (MICROZIDE) 12.5 MG capsule Take 1 capsule (12.5 mg total) by mouth daily. 90 capsule 0  . latanoprost (XALATAN) 0.005 % ophthalmic solution Place 1 drop into both eyes at bedtime.     Marland Kitchen levothyroxine (SYNTHROID, LEVOTHROID) 50 MCG tablet Take 50 mcg by mouth daily.      Marland Kitchen loratadine (CLARITIN) 10 MG tablet Take 10 mg by mouth daily.    Marland Kitchen losartan (COZAAR) 50 MG tablet Take 50 mg by mouth 2 (two) times daily.    . pantoprazole (PROTONIX) 40 MG tablet Take 1 capsule 1 hour before your first meal of the day 30 tablet 5  . predniSONE (DELTASONE) 10 MG tablet Take 1 tablet (10 mg total) by mouth daily with breakfast. 30 tablet 5  . Sennosides (SENOKOT PO) Take 1 tablet by mouth every other day.    . sodium chloride HYPERTONIC 3 % nebulizer solution Take by nebulization 2 (two) times daily. 750 mL 12  . VITAMIN D, CHOLECALCIFEROL, PO Take 2 tablets by mouth daily.      No current facility-administered medications on file prior to visit.     Allergies  Allergen Reactions  . Codeine Anaphylaxis  . Hydrocodone Nausea And Vomiting and Other (See Comments)    SYNCOPE AND BRADYCARDIA  . Isosorbide Nitrate Other (See Comments)    BRADYCARDIA  . Oxycodone Palpitations    Rapid heart beat  . Clarithromycin Nausea And Vomiting    Has patient had a PCN reaction causing immediate rash, facial/tongue/throat swelling, SOB or lightheadedness with hypotension: Unknown Has patient had a PCN reaction causing severe rash involving mucus membranes or skin necrosis: Unknown Has patient had a PCN reaction that required hospitalization: Unknown Has patient had a PCN reaction occurring within the last 10 years: Unknown If all of the above answers are "NO", then may proceed with Cephalosporin use.   . Fiorinal [Butalbital-Aspirin-Caffeine]  Swelling and Other (See Comments)    Facial swelling   . Levofloxacin Nausea And Vomiting and Other (See Comments)    SYNCOPE ALSO  . Augmentin [Amoxicillin-Pot Clavulanate] Diarrhea    Past Medical History:  Diagnosis Date  . Allergic rhinitis   . Anxiety    Anxiety attacks  . Arthritis    "a little; in my back and hands" (04/15/2012)  . Asthma   . Bronchiectasis    with history of Legionaires with chronic rales/rhonchi  .  Chest pain at rest   . Daily headache    "over the last week" 04/15/2012   . Depression   . Diastolic dysfunction    a. per echo 08/2010 with normal LV function;  b. 06/2011 Echo: EF 65-70%  . GERD (gastroesophageal reflux disease)    "just recently" (04/15/2012)  . H/O hiatal hernia   . H/O Legionnaire's disease 11/1976  . History of blood transfusion 1972   "w/hysterectomy" (04/15/2012)  . Hypercholesteremia   . Hypertension    Negative renal duplex in December of 2012  . Hypothyroidism   . Idiopathic peripheral neuropathy   . MAC (mycobacterium avium-intracellulare complex)   . Migraines    "outgrew them" (04/15/2012)  . NSTEMI (non-ST elevated myocardial infarction) ([place])    a. Normal coronaries per cath August 2012 and negative CT angio for PE;  b. 06/2011 Repeat admission w/ chest pain and elevated troponin's, CTA Chest w/o PE  . Osteopenia 02/2011   t score - 2.1  . Pneumonia    "recurrent" (04/15/2012)  . Pollen allergies   . Shortness of breath    "sometimes just lying down" (04/15/2012)  . Stroke [facility]) 06/2014   cerebellum    Past Surgical History:  Procedure Laterality Date  . APPENDECTOMY    . CARDIAC CATHETERIZATION  August 2012   Normal coronaries.  Marland Kitchen DILATION AND CURETTAGE OF UTERUS    . TONSILLECTOMY  1930  . TOTAL ABDOMINAL HYSTERECTOMY W/ BILATERAL SALPINGOOPHORECTOMY  1972   leiomyomata, menorrhagia    Social History   Tobacco Use  Smoking Status Never Smoker  Smokeless Tobacco Never Used    Social History   Substance and  Sexual Activity  Alcohol Use Yes  . Alcohol/week: 1.2 oz  . Types: 2 Glasses of wine per week   Comment: wine 1-2 week    Family History  Problem Relation Age of Onset  . Hypertension Mother        stroke  . Stroke Mother   . Cancer Brother        prostate  . Osteoarthritis Paternal Aunt     Review of Systems: The review of systems is per the HPI.  All other systems were reviewed and are negative.   Physical Exam: Blood pressure 108/68, pulse (!) 113, height 5' 7.6" (1.717 m), weight 148 lb 12.8 oz (67.5 kg), last menstrual period 01/14/1970, SpO2 93 %.  GEN:   Elderly female.   NAD  HEENT: Normal NECK: No JVD; No carotid bruits LYMPHATICS: No lymphadenopathy CARDIAC: RRR, tachycardic  RESPIRATORY:  Clear to auscultation without rales, wheezing or rhonchi  ABDOMEN: Soft, non-tender, non-distended MUSCULOSKELETAL:  No edema; No deformity  SKIN: Warm and dry NEUROLOGIC:  Alert and oriented x 3   LABORATORY DATA:  Lab Results  Component Value Date   WBC 6.8 01/26/2017   HGB 12.6 01/26/2017   HCT 40.3 01/26/2017   PLT 270 01/26/2017   GLUCOSE 111 (H) 03/25/2017   CHOL 179 07/09/2014   TRIG 66 07/09/2014   HDL 67 07/09/2014   LDLCALC 99 07/09/2014   ALT 10 (L) 01/26/2017   AST 23 01/26/2017   NA 139 03/25/2017   K 4.6 03/25/2017   CL 96 03/25/2017   CREATININE 0.97 03/25/2017   BUN 14 03/25/2017   CO2 29 03/25/2017   TSH 0.522 01/26/2017   INR 1.10 07/08/2014   HGBA1C 6.0 (H) 07/09/2014   ECG:     Assessment / Plan:  1. Hypertension- Her blood pressure is  well controlled .  Follow up with Cecille Rubin in 6 months.  2. Possible TIA :   Stable from a medical standpoint . Marland Kitchen  3. Depression  4. Coronary artery disease-status post non-ST segment elevation myocardial infarction. Cardiac cath revealed nonobstructive coronary artery disease,  not having any angina   5. Bronchiectasis / atypical mycobacterium infection  Followed by Byrum  This seems to be her main  complaint    Mertie Moores, MD  05/27/2017 1:44 PM    [address],  [place], [place]  [address] Pager [phone] Phone: [phone]; Fax: [phone]

## 2017-05-27 NOTE — Patient Instructions (Signed)
Medication Instructions:  Your physician recommends that you continue on your current medications as directed. Please refer to the Current Medication list given to you today.   Labwork: None Ordered   Testing/Procedures: None Ordered   Follow-Up: Your physician wants you to follow-up in: 6 months with PA or Nurse Practitioner on Dr. Harvie Bridge team. You will receive a reminder letter in the mail two months in advance. If you don't receive a letter, please call our office to schedule the follow-up appointment.   If you need a refill on your cardiac medications before your next appointment, please call your pharmacy.   Thank you for choosing [facility]! Eligha Bridegroom, RN [phone]

## 2017-05-27 NOTE — Addendum Note (Signed)
Addended by: Vesta Mixer on: 05/27/2017 02:06 PM   Modules accepted: Level of Service

## 2017-06-19 ENCOUNTER — Other Ambulatory Visit: Payer: Self-pay | Admitting: Cardiovascular Disease

## 2017-06-23 ENCOUNTER — Encounter: Payer: Self-pay | Admitting: Emergency Medicine

## 2017-06-23 ENCOUNTER — Ambulatory Visit (INDEPENDENT_AMBULATORY_CARE_PROVIDER_SITE_OTHER): Payer: Medicare Other | Admitting: Emergency Medicine

## 2017-06-23 DIAGNOSIS — J479 Bronchiectasis, uncomplicated: Secondary | ICD-10-CM | POA: Diagnosis not present

## 2017-06-23 NOTE — Progress Notes (Signed)
   Subjective:    Patient ID: Sarah [name], female    DOB: 1921/05/16, 84 y.o.   MRN: [id number]  HPI                                                                                                                                                                                                              ROV 06/23/2017 --pleasant 84 year old woman who has bronchiectasis and asthmatic bronchitis with chronic upper airway irritation and chronic multifactorial cough.  She is on prednisone 10 mg daily as well as rotating antibiotics (doxycycline, azithromycin, Cefdinir).  She does chest vest approximately qd. She is on loratadine, hypertonic saline nebs qd. She uses albuterol about once a day. She stopped her doxy recently as she was concerned that she may have developed facial edema on it. Mucous is fairly thin, no plugs or blood.   Review of Systems As above       Objective:   Physical Exam  Vitals:   06/23/17 1138 06/23/17 1139  BP:  122/74  Pulse:  68  SpO2:  94%  Weight: 146 lb (66.2 kg)     GEN: A/Ox3; pleasant , NAD, elderly and thin   HEENT:  Op clear,   NECK:  No stridor  RESP distant, clear bilaterally, few scattered insp squeaks, crackles. No wheeze.   CARD:  RRR, no m/r/g  , no peripheral edema, pulses intact, no cyanosis or clubbing.  Musco: Warm bil, no deformities or joint swelling noted.   Neuro: alert, no focal deficits noted.    Skin: Warm, no lesions or rashes      Assessment & Plan:  BRONCHIECTASIS Please continue your prednisone 10 mg daily as you have been taking it. We will continue to rotate your antibiotics monthly.  Doxycycline, azithromycin, Cefdinir.  Please call our office if you have any difficulty tolerating the doxycycline when you restart it. Continue your chest vest as you have been using it. Continue to use your hypertonic saline nebulizer at least daily. Continue albuterol nebulized as needed. Follow with Dr Delton CoombesByrum in 3 months or sooner  if you have any problems.  Levy Pupaobert Byrum, MD, PhD 06/23/2017, 11:55 AM [facility] [phone] or if no answer [phone]

## 2017-06-23 NOTE — Assessment & Plan Note (Signed)
Please continue your prednisone 10 mg daily as you have been taking it. We will continue to rotate your antibiotics monthly.  Doxycycline, azithromycin, Cefdinir.  Please call our office if you have any difficulty tolerating the doxycycline when you restart it. Continue your chest vest as you have been using it. Continue to use your hypertonic saline nebulizer at least daily. Continue albuterol nebulized as needed. Follow with Dr Kamerin Axford in 3 months or sooner if you have any problems.  

## 2017-06-23 NOTE — Patient Instructions (Signed)
Please continue your prednisone 10 mg daily as you have been taking it. We will continue to rotate your antibiotics monthly.  Doxycycline, azithromycin, Cefdinir.  Please call our office if you have any difficulty tolerating the doxycycline when you restart it. Continue your chest vest as you have been using it. Continue to use your hypertonic saline nebulizer at least daily. Continue albuterol nebulized as needed. Follow with Dr Delton CoombesByrum in 3 months or sooner if you have any problems.

## 2017-06-25 ENCOUNTER — Telehealth: Payer: Self-pay | Admitting: Emergency Medicine

## 2017-06-25 NOTE — Telephone Encounter (Signed)
It sounds like we will need to remove doxycycline from her abx rotation. From now on she will need to alternate azithro and cefdinir for the first week of every other month. We may need to adjust her prescriptions so that she will have enough of each abx to do every other month.

## 2017-06-25 NOTE — Telephone Encounter (Signed)
Spoke with the pt's daughter, Sharyl NimrodMeredith and notified of recs per RB  She verbalized understanding  I added the Doxy to her list of allergies/intolerances

## 2017-06-25 NOTE — Telephone Encounter (Signed)
Spoke with the pt's daughter  She states that the pt did try to start back on Doxy and after one dose "felt terrible"- sweats and aches  She has not taken anymore doxy and feels completely better and no longer having the aches and sweats  She just wanted RB to know that she was still unable to tolerate med and see if he has any further recs Please advise thanks!

## 2017-07-07 ENCOUNTER — Telehealth: Payer: Self-pay | Admitting: Emergency Medicine

## 2017-07-07 NOTE — Telephone Encounter (Deleted)
Copied from CRM [phone]. Topic: Quick Communication - Rx Refill/Question >> Jul 07, 2017 12:28 PM Gean BirchwoodWilliams-Neal, Sade R wrote: Medication: ALPRAZolam Prudy Feeler(XANAX) 0.5 MG tablet  Has the patient contacted their pharmacy? Yes (Agent: If no, request that the patient contact the pharmacy for the refill.) (Agent: If yes, when and what did the pharmacy advise?)  Preferred Pharmacy (with phone number or street name): [facility] [place] - [address] [phone] (Phone) [phone] (Fax)      Agent: Please be advised that RX refills may take up to 3 business days. We ask that you follow-up with your pharmacy.

## 2017-07-08 ENCOUNTER — Telehealth: Payer: Self-pay | Admitting: Emergency Medicine

## 2017-07-08 MED ORDER — ALBUTEROL SULFATE (2.5 MG/3ML) 0.083% IN NEBU: 2.5000 mg | INHALATION_SOLUTION | Freq: Two times a day (BID) | RESPIRATORY_TRACT | 3 refills | Status: DC

## 2017-07-08 NOTE — Telephone Encounter (Signed)
Spoke with pt's daughter Sharyl NimrodMeredith (dpr on file), requesting a 90 day supply of albuterol neb solution to be sent to [facility] on [address].  This has been sent.  Nothing further needed.

## 2017-07-21 ENCOUNTER — Other Ambulatory Visit: Payer: Self-pay | Admitting: Emergency Medicine

## 2017-08-12 ENCOUNTER — Other Ambulatory Visit: Payer: Self-pay | Admitting: Cardiovascular Disease

## 2017-09-05 ENCOUNTER — Ambulatory Visit (INDEPENDENT_AMBULATORY_CARE_PROVIDER_SITE_OTHER): Payer: Medicare Other | Admitting: Podiatry

## 2017-09-05 DIAGNOSIS — M79674 Pain in right toe(s): Secondary | ICD-10-CM | POA: Diagnosis not present

## 2017-09-05 DIAGNOSIS — F411 Generalized anxiety disorder: Secondary | ICD-10-CM | POA: Diagnosis not present

## 2017-09-05 DIAGNOSIS — M2041 Other hammer toe(s) (acquired), right foot: Secondary | ICD-10-CM

## 2017-09-05 DIAGNOSIS — M79675 Pain in left toe(s): Secondary | ICD-10-CM

## 2017-09-05 DIAGNOSIS — L84 Corns and callosities: Secondary | ICD-10-CM

## 2017-09-05 DIAGNOSIS — F633 Trichotillomania: Secondary | ICD-10-CM | POA: Diagnosis not present

## 2017-09-05 DIAGNOSIS — M2042 Other hammer toe(s) (acquired), left foot: Secondary | ICD-10-CM

## 2017-09-05 DIAGNOSIS — B351 Tinea unguium: Secondary | ICD-10-CM | POA: Diagnosis not present

## 2017-09-05 DIAGNOSIS — S90424A Blister (nonthermal), right lesser toe(s), initial encounter: Secondary | ICD-10-CM | POA: Diagnosis not present

## 2017-09-05 DIAGNOSIS — F339 Major depressive disorder, recurrent, unspecified: Secondary | ICD-10-CM | POA: Diagnosis not present

## 2017-09-05 DIAGNOSIS — G629 Polyneuropathy, unspecified: Secondary | ICD-10-CM | POA: Diagnosis not present

## 2017-09-05 MED ORDER — MUPIROCIN 2 % EX OINT: 1.0000 "application " | TOPICAL_OINTMENT | Freq: Two times a day (BID) | CUTANEOUS | 2 refills | Status: DC

## 2017-09-06 NOTE — Progress Notes (Signed)
Subjective:   Patient ID: Sarah [name], female   DOB: 84 y.o.   MRN: [id number]   HPI 84 year old female presents the office today with her daughter for concerns of infection of the right second toe that she noticed 4 days ago.  She states that the area of pus coming from the area.  She is currently not on any antibiotics right now she does take antibiotic 1 week a month for other issues.  She states the area started to heal over on the right second toe however still causes some discomfort.  No recent injury that she recalls.  She also has a corn on the left fourth toe that is painful.  She has no other concerns today.   Review of Systems  All other systems reviewed and are negative.  Past Medical History:  Diagnosis Date  . Allergic rhinitis   . Anxiety    Anxiety attacks  . Arthritis    "a little; in my back and hands" (04/15/2012)  . Asthma   . Bronchiectasis    with history of Legionaires with chronic rales/rhonchi  . Chest pain at rest   . Daily headache    "over the last week" 04/15/2012   . Depression   . Diastolic dysfunction    a. per echo 08/2010 with normal LV function;  b. 06/2011 Echo: EF 65-70%  . GERD (gastroesophageal reflux disease)    "just recently" (04/15/2012)  . H/O hiatal hernia   . H/O Legionnaire's disease 11/1976  . History of blood transfusion 1972   "w/hysterectomy" (04/15/2012)  . Hypercholesteremia   . Hypertension    Negative renal duplex in December of 2012  . Hypothyroidism   . Idiopathic peripheral neuropathy   . MAC (mycobacterium avium-intracellulare complex)   . Migraines    "outgrew them" (04/15/2012)  . NSTEMI (non-ST elevated myocardial infarction) ([place])    a. Normal coronaries per cath August 2012 and negative CT angio for PE;  b. 06/2011 Repeat admission w/ chest pain and elevated troponin's, CTA Chest w/o PE  . Osteopenia 02/2011   t score - 2.1  . Pneumonia    "recurrent" (04/15/2012)  . Pollen allergies   . Shortness of breath     "sometimes just lying down" (04/15/2012)  . Stroke [facility]) 06/2014   cerebellum    Past Surgical History:  Procedure Laterality Date  . APPENDECTOMY    . CARDIAC CATHETERIZATION  August 2012   Normal coronaries.  Marland Kitchen DILATION AND CURETTAGE OF UTERUS    . TONSILLECTOMY  1930  . TOTAL ABDOMINAL HYSTERECTOMY W/ BILATERAL SALPINGOOPHORECTOMY  1972   leiomyomata, menorrhagia     Current Outpatient Medications:  .  albuterol (PROVENTIL HFA;VENTOLIN HFA) 108 (90 Base) MCG/ACT inhaler, Inhale 1-2 puffs into the lungs every 6 (six) hours as needed for wheezing or shortness of breath., Disp: 1 Inhaler, Rfl: 6 .  albuterol (PROVENTIL) (2.5 MG/3ML) 0.083% nebulizer solution, Take 3 mLs (2.5 mg total) by nebulization 2 (two) times daily. Take 1 vial twice a day. DX: J47.9, Disp: 720 mL, Rfl: 3 .  ALPRAZolam (XANAX) 0.5 MG tablet, Take 0.5 mg by mouth 2 (two) times daily., Disp: , Rfl:  .  calcium carbonate (TUMS - DOSED IN MG ELEMENTAL CALCIUM) 500 MG chewable tablet, Chew 1 tablet by mouth every 8 (eight) hours as needed for indigestion or heartburn., Disp: , Rfl:  .  carvedilol (COREG) 6.25 MG tablet, TAKE 1 TABLET(6.25 MG) BY MOUTH TWICE DAILY, Disp: 180 tablet, Rfl: 3 .  clonazePAM (KLONOPIN) 0.5 MG tablet, Take 0.5 mg by mouth 2 (two) times daily., Disp: , Rfl:  .  clopidogrel (PLAVIX) 75 MG tablet, Take 1 tablet (75 mg total) by mouth daily., Disp: 30 tablet, Rfl: 1 .  DULoxetine (CYMBALTA) 20 MG capsule, Take 20 mg by mouth 3 (three) times daily., Disp: , Rfl:  .  escitalopram (LEXAPRO) 5 MG tablet, Take 2.5 mg by mouth daily., Disp: , Rfl:  .  gabapentin (NEURONTIN) 100 MG capsule, Take 100 mg by mouth daily., Disp: , Rfl:  .  hydrochlorothiazide (MICROZIDE) 12.5 MG capsule, Take 1 capsule (12.5 mg total) by mouth daily., Disp: 90 capsule, Rfl: 0 .  latanoprost (XALATAN) 0.005 % ophthalmic solution, Place 1 drop into both eyes at bedtime. , Disp: , Rfl:  .  levothyroxine (SYNTHROID, LEVOTHROID) 50  MCG tablet, Take 50 mcg by mouth daily.  , Disp: , Rfl:  .  loratadine (CLARITIN) 10 MG tablet, Take 10 mg by mouth daily., Disp: , Rfl:  .  losartan (COZAAR) 50 MG tablet, TAKE 1 TABLET BY MOUTH TWICE DAILY, Disp: 180 tablet, Rfl: 2 .  mupirocin ointment (BACTROBAN) 2 %, Apply 1 application topically 2 (two) times daily., Disp: 30 g, Rfl: 2 .  pantoprazole (PROTONIX) 40 MG tablet, Take 1 capsule 1 hour before your first meal of the day, Disp: 30 tablet, Rfl: 5 .  predniSONE (DELTASONE) 10 MG tablet, Take 1 tablet (10 mg total) by mouth daily with breakfast., Disp: 30 tablet, Rfl: 5 .  Sennosides (SENOKOT PO), Take 1 tablet by mouth every other day., Disp: , Rfl:  .  sodium chloride HYPERTONIC 3 % nebulizer solution, Take by nebulization 2 (two) times daily., Disp: 750 mL, Rfl: 12 .  VITAMIN D, CHOLECALCIFEROL, PO, Take 2 tablets by mouth daily. , Disp: , Rfl:   Allergies  Allergen Reactions  . Codeine Anaphylaxis  . Hydrocodone Nausea And Vomiting and Other (See Comments)    SYNCOPE AND BRADYCARDIA  . Isosorbide Nitrate Other (See Comments)    BRADYCARDIA  . Oxycodone Palpitations    Rapid heart beat  . Clarithromycin Nausea And Vomiting    Has patient had a PCN reaction causing immediate rash, facial/tongue/throat swelling, SOB or lightheadedness with hypotension: Unknown Has patient had a PCN reaction causing severe rash involving mucus membranes or skin necrosis: Unknown Has patient had a PCN reaction that required hospitalization: Unknown Has patient had a PCN reaction occurring within the last 10 years: Unknown If all of the above answers are "NO", then may proceed with Cephalosporin use.   . Fiorinal [Butalbital-Aspirin-Caffeine] Swelling and Other (See Comments)    Facial swelling   . Levofloxacin Nausea And Vomiting and Other (See Comments)    SYNCOPE ALSO  . Augmentin [Amoxicillin-Pot Clavulanate] Diarrhea  . Doxycycline     Sweats and aches    Social History    Socioeconomic History  . Marital status: Married    Spouse name: Not on file  . Number of children: Not on file  . Years of education: Not on file  . Highest education level: Not on file  Occupational History  . Occupation: retired Education officer, museum  Social Needs  . Financial resource strain: Not on file  . Food insecurity:    Worry: Not on file    Inability: Not on file  . Transportation needs:    Medical: Not on file    Non-medical: Not on file  Tobacco Use  . Smoking status: Never Smoker  .  Smokeless tobacco: Never Used  Substance and Sexual Activity  . Alcohol use: Yes    Alcohol/week: 2.0 standard drinks    Types: 2 Glasses of wine per week    Comment: wine 1-2 week  . Drug use: No  . Sexual activity: Never    Birth control/protection: Surgical  Lifestyle  . Physical activity:    Days per week: Not on file    Minutes per session: Not on file  . Stress: Not on file  Relationships  . Social connections:    Talks on phone: Not on file    Gets together: Not on file    Attends religious service: Not on file    Active member of club or organization: Not on file    Attends meetings of clubs or organizations: Not on file    Relationship status: Not on file  . Intimate partner violence:    Fear of current or ex partner: Not on file    Emotionally abused: Not on file    Physically abused: Not on file    Forced sexual activity: Not on file  Other Topics Concern  . Not on file  Social History Narrative   Lives in an assisted living       Objective:  Physical Exam  General: AAO x3, NAD  Dermatological: On the dorsal aspect the right second toe is what appears to be an old blister that started to heal over.  There is a small amount of blister fluid still present upon drainage and there is some bloody drainage but there is no pus.  There is no surrounding erythema there is no ascending cellulitis.  There is no fluctuation or crepitation.  There is no malodor.  The left  foot there is a hyperkeratotic lesion without any underlying ulceration drainage or any signs of infection.  Nails are hypertrophic, dystrophic and elongated.  No pain to the nails there is no skin redness or drainage.  She states her nails do occasionally become uncomfortable at times.  Vascular: Dorsalis Pedis artery and Posterior Tibial artery pedal pulses are 2/4 bilateral with immedate capillary fill time.  Venous skin changes present bilateral legs there is no pain with calf compression, swelling, warmth, erythema.   Neruologic: Grossly intact via light touch bilateral. Protective threshold with Semmes Wienstein monofilament intact to all pedal sites bilateral.   Musculoskeletal: Hammertoe deformities are present.  Tenderness of the hyperkeratotic lesion left fourth toe and left second toe.  Muscular strength 5/5 in all groups tested bilateral.     Assessment:   84 year old female with healing wound, blister right second toe with pre-ulcerative callus left fourth toe     Plan:  -Treatment options discussed including all alternatives, risks, and complications -Etiology of symptoms were discussed -I tried to drain some of the fluid from the blister on the right second toe today when some bloody drainage was identified.  Mupirocin ointment was ordered today for hemorrhage applied dressing to the area daily.  Offloading at all times.  Discussed wearing open toed shoe or soft tissue to avoid any pressure to the toes given the deformities.  As a courtesy also debrided the nails without any complications or bleeding.  Monitor for any clinical signs or symptoms of infection and directed to call the office immediately should any occur or go to the ER.  Trula Slade DPM

## 2017-09-07 ENCOUNTER — Other Ambulatory Visit: Payer: Self-pay | Admitting: Emergency Medicine

## 2017-09-17 DIAGNOSIS — H903 Sensorineural hearing loss, bilateral: Secondary | ICD-10-CM | POA: Diagnosis not present

## 2017-09-19 ENCOUNTER — Ambulatory Visit: Payer: Self-pay | Admitting: Podiatry

## 2017-09-19 ENCOUNTER — Encounter: Payer: Self-pay | Admitting: Podiatry

## 2017-09-19 ENCOUNTER — Ambulatory Visit (INDEPENDENT_AMBULATORY_CARE_PROVIDER_SITE_OTHER): Payer: Medicare Other | Admitting: Podiatry

## 2017-09-19 DIAGNOSIS — L84 Corns and callosities: Secondary | ICD-10-CM

## 2017-09-19 DIAGNOSIS — M2041 Other hammer toe(s) (acquired), right foot: Secondary | ICD-10-CM | POA: Diagnosis not present

## 2017-09-19 DIAGNOSIS — M2042 Other hammer toe(s) (acquired), left foot: Secondary | ICD-10-CM | POA: Diagnosis not present

## 2017-09-20 NOTE — Progress Notes (Signed)
Subjective: 84 year old female presents the office today with her daughter today for concerns and follow-up evaluation of right second toe wound as well as a callus possible corn left fourth toe.  She states the right second toe is doing much better and feels much better but she still gets pain in the left fourth toe on the area of the corn.  Denies any redness or drainage or any swelling.  She is on Plavix.  She has no other concerns. Denies any systemic complaints such as fevers, chills, nausea, vomiting. No acute changes since last appointment, and no other complaints at this time.   Objective: AAO x3, NAD DP/PT pulses palpable bilaterally, CRT less than 3 seconds The hyperkeratotic lesion, wound on the dorsal aspect the right second toe is doing much better.  It was completely healed at this point.  There is a very minimal pinpoint type superficial abrasion area present and this appears to be where the scab is come off of the callus.  There is no edema, erythema, drainage pus and there is no clinical signs of infection present.  Hyperkeratotic lesion left fourth DIPJ dorsally.  Upon debridement there is no underlying ulceration drainage or any signs of infection.  Hammertoe contractures are present. No open lesions or pre-ulcerative lesions.  No pain with calf compression, swelling, warmth, erythema  Assessment: Hyperkeratotic lesion left fourth toe with healing wound right second toe  Plan: -All treatment options discussed with the patient including all alternatives, risks, complications.  -I debrided the hyperkeratotic lesion left foot without any complications or bleeding.  Also I debrided some of the hyperkeratotic lesion on the right second toe.  The wound on the right is doing much better almost completely healed.  I want her to continue a small amount of antibiotic ointment dressing changes daily to the area on the right side.  I dispensed offloading pads.  She is also very very pointing,  flat shoes we discussed trying to wear a more supportive shoe to help take pressure off of the hammertoes at the change in shoes will be helpful as well.  Follow-up next 2 weeks if the wound does not heal on the right second toe it appears to be almost healed at this time.  Otherwise I will see her back for routine care or sooner if needed. -Patient encouraged to call the office with any questions, concerns, change in symptoms.   Vivi Barrack DPM

## 2017-09-22 ENCOUNTER — Ambulatory Visit (INDEPENDENT_AMBULATORY_CARE_PROVIDER_SITE_OTHER): Payer: Medicare Other | Admitting: Emergency Medicine

## 2017-09-22 ENCOUNTER — Encounter: Payer: Self-pay | Admitting: Emergency Medicine

## 2017-09-22 DIAGNOSIS — Z23 Encounter for immunization: Secondary | ICD-10-CM | POA: Diagnosis not present

## 2017-09-22 DIAGNOSIS — J4521 Mild intermittent asthma with (acute) exacerbation: Secondary | ICD-10-CM

## 2017-09-22 DIAGNOSIS — J479 Bronchiectasis, uncomplicated: Secondary | ICD-10-CM

## 2017-09-22 NOTE — Patient Instructions (Signed)
High-dose flu shot today. Please continue prednisone 5 mg daily. Please continue to rotate your azithromycin and Cefdinir at the beginning of each month.  We will no longer be on doxycycline. Continue your chest vest at least once daily. Continue to use albuterol nebulizers as needed to help with secretion clearance. We will stop your hypertonic saline nebulizer treatments. Follow with Dr Delton Coombes in 3 months or sooner if you have any problems.

## 2017-09-22 NOTE — Assessment & Plan Note (Signed)
Please continue prednisone 5 mg daily Albuterol nebulized as needed. Try to control allergic rhinitis, GERD/hiatal hernia as well as possible seizure triggers.

## 2017-09-22 NOTE — Assessment & Plan Note (Signed)
High-dose flu shot today. Please continue prednisone 5 mg daily. Please continue to rotate your azithromycin and Cefdinir at the beginning of each month.  We will no longer be on doxycycline. Continue your chest vest at least once daily. Continue to use albuterol nebulizers as needed to help with secretion clearance. We will stop your hypertonic saline nebulizer treatments. Follow with Dr Teneka Malmberg in 3 months or sooner if you have any problems.  

## 2017-09-22 NOTE — Progress Notes (Signed)
Subjective:    Patient ID: Sarah [name], female    DOB: Feb 23, 1921, 84 y.o.   MRN: [id number]  HPI                                                                                                                                                                                                           ROV 06/23/2017 --pleasant 84 year old woman who has bronchiectasis and asthmatic bronchitis with chronic upper airway irritation and chronic multifactorial cough.  She is on prednisone 10 mg daily as well as rotating antibiotics (doxycycline, azithromycin, Cefdinir).  She does chest vest approximately qd. She is on loratadine, hypertonic saline nebs qd. She uses albuterol about once a day. She stopped her doxy recently as she was concerned that she may have developed facial edema on it. Mucous is fairly thin, no plugs or blood.   ROV 09/22/17 --Sarah [name] is 84 and has a history of bronchiectasis with chronic asthmatic bronchitis, chronic multifactorial cough with upper airway irritation syndrome.  We have managed her on rotating antibiotics (doxycycline, azithromycin, Cefdinir) but she had to come off doxycycline due to mouth and lip swelling. Prednisone 5mg  daily. She uses albuterol about 1-2x a day, helps w secretion clearance. She uses vibra-vest daily, does help w secretions intermittently. No longer on hypertonic nebs.  She took her azithro 5 days early this month due to increased mucous production. She feels at baseline right now.    Review of Systems As above       Objective:   Physical Exam  Vitals:   09/22/17 1123  BP: 114/64  Pulse: 81  SpO2: 95%  Weight: 146 lb (66.2 kg)  Height: 5' 4.25" (1.632 m)    GEN: A/Ox3; pleasant , NAD, elderly and thin   HEENT:  Op clear,   NECK:  No stridor  RESP distant, clear bilaterally, few scattered insp squeaks, crackles. No wheeze.   CARD:  Regular, no M  Musco: Warm bil, no deformities or joint swelling noted.   Neuro: alert, no focal  deficits noted.    Skin: Warm, no lesions or rashes      Assessment & Plan:  BRONCHIECTASIS High-dose flu shot today. Please continue prednisone 5 mg daily. Please continue to rotate your azithromycin and Cefdinir at the beginning of each month.  We will no longer be on doxycycline. Continue your chest vest at least once daily. Continue to use albuterol nebulizers as needed to help with secretion clearance. We will stop your hypertonic saline nebulizer treatments. Follow with Dr Delton Coombes in 3  months or sooner if you have any problems.  Asthma Please continue prednisone 5 mg daily Albuterol nebulized as needed. Try to control allergic rhinitis, GERD/hiatal hernia as well as possible seizure triggers.  Levy Pupa, MD, PhD 09/22/2017, 12:04 PM [facility] [phone] or if no answer [phone]

## 2017-09-30 DIAGNOSIS — H401131 Primary open-angle glaucoma, bilateral, mild stage: Secondary | ICD-10-CM | POA: Diagnosis not present

## 2017-10-05 ENCOUNTER — Other Ambulatory Visit: Payer: Self-pay | Admitting: Emergency Medicine

## 2017-10-07 DIAGNOSIS — H353213 Exudative age-related macular degeneration, right eye, with inactive scar: Secondary | ICD-10-CM | POA: Diagnosis not present

## 2017-10-07 DIAGNOSIS — H353122 Nonexudative age-related macular degeneration, left eye, intermediate dry stage: Secondary | ICD-10-CM | POA: Diagnosis not present

## 2017-10-07 DIAGNOSIS — H43813 Vitreous degeneration, bilateral: Secondary | ICD-10-CM | POA: Diagnosis not present

## 2017-10-16 ENCOUNTER — Other Ambulatory Visit: Payer: Self-pay | Admitting: Emergency Medicine

## 2017-11-17 ENCOUNTER — Other Ambulatory Visit: Payer: Self-pay | Admitting: Emergency Medicine

## 2017-11-18 ENCOUNTER — Telehealth: Payer: Self-pay | Admitting: Emergency Medicine

## 2017-11-18 NOTE — Telephone Encounter (Signed)
lmtcb for pt  Editor: Leslye Peer, MD (Physician)    High-dose flu shot today. Please continue prednisone 5 mg daily. Please continue to rotate your azithromycin and Cefdinir at the beginning of each month.  We will no longer be on doxycycline. Continue your chest vest at least once daily. Continue to use albuterol nebulizers as needed to help with secretion clearance. We will stop your hypertonic saline nebulizer treatments. Follow with Dr Delton Coombes in 3 months or sooner if you have any problems

## 2017-11-19 MED ORDER — CEFDINIR 300 MG PO CAPS: 300.0000 mg | ORAL_CAPSULE | Freq: Two times a day (BID) | ORAL | 0 refills | Status: DC

## 2017-11-19 NOTE — Telephone Encounter (Signed)
Pt is calling back [phone]

## 2017-11-19 NOTE — Telephone Encounter (Signed)
Called and spoke with patient, prescription sent in. Nothing further needed.

## 2017-11-25 ENCOUNTER — Ambulatory Visit (INDEPENDENT_AMBULATORY_CARE_PROVIDER_SITE_OTHER): Payer: Medicare Other | Admitting: Nurse Practitioner

## 2017-11-25 ENCOUNTER — Encounter: Payer: Self-pay | Admitting: Nurse Practitioner

## 2017-11-25 VITALS — BP 140/78 | HR 88 | Ht 64.0 in | Wt 145.8 lb

## 2017-11-25 DIAGNOSIS — I5032 Chronic diastolic (congestive) heart failure: Secondary | ICD-10-CM

## 2017-11-25 DIAGNOSIS — I499 Cardiac arrhythmia, unspecified: Secondary | ICD-10-CM | POA: Diagnosis not present

## 2017-11-25 DIAGNOSIS — I2510 Atherosclerotic heart disease of native coronary artery without angina pectoris: Secondary | ICD-10-CM | POA: Diagnosis not present

## 2017-11-25 DIAGNOSIS — Z79899 Other long term (current) drug therapy: Secondary | ICD-10-CM

## 2017-11-25 DIAGNOSIS — I10 Essential (primary) hypertension: Secondary | ICD-10-CM | POA: Diagnosis not present

## 2017-11-25 MED ORDER — AZITHROMYCIN 250 MG PO TABS: ORAL_TABLET | ORAL | 2 refills | Status: DC

## 2017-11-25 NOTE — Progress Notes (Signed)
CARDIOLOGY OFFICE NOTE  Date:  11/25/2017    Sarah [name] Date of Birth: Sep 14, 1921 Medical Record #[id number]  PCP:  [name], MD  Cardiologist:  Servando Snare Nahser    Chief Complaint  Patient presents with  . Coronary Artery Disease  . Hypertension    Follow up visit - seen for Dr. Acie Fredrickson    History of Present Illness: Sarah [name] is a 84 y.o. female who presents today for a follow up visit. Seen for Dr. Acie Fredrickson. I have not seen her in many years. She was previously followed by Dr. Doreatha Lew.   She has a history of HTN, asthma, depression/anxiety/panic attacks and CAD with remote NSTEMI with prior cath in 2012 showing nonobstructive CAD and bronchiectasis/atypical mycobacterium/COPD.  Echocardiogram in January, 2019 reveals a vigorous left ventricular systolic function with EF of 65 to 70% with grade 1 diastolic dysfunction.  Comes in today. Here with her son. She resides at [facility]. There have been recent issues with the HVAC system for her unit. She woke up at 3AM - it was 95 degrees in her apartment. She feels shakey. Did not sleep well. Really did not want to come to this visit. She has basically been doing ok. No real chest pain. Does have some palpitations that will wake her up at night - nothing that seems to be causing any symptoms to her - she is able to go back to sleep. She says "I'm old". Looks to be on long course of antibiotics - says she alternates each month.   Past Medical History:  Diagnosis Date  . Allergic rhinitis   . Anxiety    Anxiety attacks  . Arthritis    "a little; in my back and hands" (04/15/2012)  . Asthma   . Bronchiectasis    with history of Legionaires with chronic rales/rhonchi  . Chest pain at rest   . Daily headache    "over the last week" 04/15/2012   . Depression   . Diastolic dysfunction    a. per echo 08/2010 with normal LV function;  b. 06/2011 Echo: EF 65-70%  . GERD (gastroesophageal reflux disease)    "just recently"  (04/15/2012)  . H/O hiatal hernia   . H/O Legionnaire's disease 11/1976  . History of blood transfusion 1972   "w/hysterectomy" (04/15/2012)  . Hypercholesteremia   . Hypertension    Negative renal duplex in December of 2012  . Hypothyroidism   . Idiopathic peripheral neuropathy   . MAC (mycobacterium avium-intracellulare complex)   . Migraines    "outgrew them" (04/15/2012)  . NSTEMI (non-ST elevated myocardial infarction) ([place])    a. Normal coronaries per cath August 2012 and negative CT angio for PE;  b. 06/2011 Repeat admission w/ chest pain and elevated troponin's, CTA Chest w/o PE  . Osteopenia 02/2011   t score - 2.1  . Pneumonia    "recurrent" (04/15/2012)  . Pollen allergies   . Shortness of breath    "sometimes just lying down" (04/15/2012)  . Stroke [facility]) 06/2014   cerebellum    Past Surgical History:  Procedure Laterality Date  . APPENDECTOMY    . CARDIAC CATHETERIZATION  August 2012   Normal coronaries.  Marland Kitchen DILATION AND CURETTAGE OF UTERUS    . TONSILLECTOMY  1930  . TOTAL ABDOMINAL HYSTERECTOMY W/ BILATERAL SALPINGOOPHORECTOMY  1972   leiomyomata, menorrhagia     Medications: Current Meds  Medication Sig  . albuterol (PROVENTIL HFA;VENTOLIN HFA) 108 (90 Base) MCG/ACT inhaler Inhale  1-2 puffs into the lungs every 6 (six) hours as needed for wheezing or shortness of breath.  Marland Kitchen albuterol (PROVENTIL) (2.5 MG/3ML) 0.083% nebulizer solution Take 3 mLs (2.5 mg total) by nebulization 2 (two) times daily. Take 1 vial twice a day. DX: J47.9  . ALPRAZolam (XANAX) 0.25 MG tablet Take 0.5 mg by mouth 3 (three) times daily.   Marland Kitchen azithromycin (ZITHROMAX) 250 MG tablet TAKE 2 TABLET TODAY THEN TAKE 1 TABLET DAILY FOR 4 DAYS  . calcium carbonate (TUMS - DOSED IN MG ELEMENTAL CALCIUM) 500 MG chewable tablet Chew 1 tablet by mouth every 8 (eight) hours as needed for indigestion or heartburn.  . carvedilol (COREG) 6.25 MG tablet TAKE 1 TABLET(6.25 MG) BY MOUTH TWICE DAILY  . cefdinir  (OMNICEF) 300 MG capsule Take 1 capsule (300 mg total) by mouth 2 (two) times daily.  . clopidogrel (PLAVIX) 75 MG tablet Take 1 tablet (75 mg total) by mouth daily.  . DULoxetine (CYMBALTA) 20 MG capsule Take 20 mg by mouth 3 (three) times daily.  Marland Kitchen escitalopram (LEXAPRO) 5 MG tablet Take 2.5 mg by mouth daily.  Marland Kitchen gabapentin (NEURONTIN) 100 MG capsule Take 100 mg by mouth 3 (three) times daily.   Marland Kitchen latanoprost (XALATAN) 0.005 % ophthalmic solution Place 1 drop into both eyes at bedtime.   Marland Kitchen levothyroxine (SYNTHROID, LEVOTHROID) 50 MCG tablet Take 50 mcg by mouth daily.    Marland Kitchen loratadine (CLARITIN) 10 MG tablet Take 10 mg by mouth daily.  . mupirocin ointment (BACTROBAN) 2 % Apply 1 application topically 2 (two) times daily.  . pantoprazole (PROTONIX) 40 MG tablet Take 1 capsule 1 hour before your first meal of the day  . predniSONE (DELTASONE) 5 MG tablet Take 5 mg by mouth daily with breakfast.  . Sennosides (SENOKOT PO) Take 1 tablet by mouth every other day.  . sodium chloride HYPERTONIC 3 % nebulizer solution Take by nebulization 2 (two) times daily.  Marland Kitchen VITAMIN D, CHOLECALCIFEROL, PO Take 2 tablets by mouth daily.   . [DISCONTINUED] azithromycin (ZITHROMAX) 250 MG tablet TAKE 2 TABLETS BY MOUTH ON DAY 1, THEN TAKE 1 TABLET BY MOUTH DAILY FOR 4 DAYS  . [DISCONTINUED] azithromycin (ZITHROMAX) 250 MG tablet TAKE 2 TABLET TODAY THEN TAKE 1 TABLET DAILY FOR 4 DAYS     Allergies: Allergies  Allergen Reactions  . Codeine Anaphylaxis  . Hydrocodone Nausea And Vomiting and Other (See Comments)    SYNCOPE AND BRADYCARDIA  . Isosorbide Nitrate Other (See Comments)    BRADYCARDIA  . Oxycodone Palpitations    Rapid heart beat  . Clarithromycin Nausea And Vomiting    Has patient had a PCN reaction causing immediate rash, facial/tongue/throat swelling, SOB or lightheadedness with hypotension: Unknown Has patient had a PCN reaction causing severe rash involving mucus membranes or skin necrosis:  Unknown Has patient had a PCN reaction that required hospitalization: Unknown Has patient had a PCN reaction occurring within the last 10 years: Unknown If all of the above answers are "NO", then may proceed with Cephalosporin use.   . Fiorinal [Butalbital-Aspirin-Caffeine] Swelling and Other (See Comments)    Facial swelling   . Levofloxacin Nausea And Vomiting and Other (See Comments)    SYNCOPE ALSO  . Augmentin [Amoxicillin-Pot Clavulanate] Diarrhea  . Doxycycline     Sweats and aches    Social History: The patient  reports that she has never smoked. She has never used smokeless tobacco. She reports that she drinks about 2.0 standard drinks of alcohol  per week. She reports that she does not use drugs.   Family History: The patient's family history includes Cancer in her brother; Hypertension in her mother; Osteoarthritis in her paternal aunt; Stroke in her mother.   Review of Systems: Please see the history of present illness.   Otherwise, the review of systems is positive for none.   All other systems are reviewed and negative.   Physical Exam: VS:  BP 140/78 (BP Location: Left Arm, Patient Position: Sitting, Cuff Size: Normal)   Pulse 88   Ht _0  (1.626 m)   Wt 145 lb 12.8 oz (66.1 kg)   LMP 01/14/1970   BMI 25.03 kg/m  .  BMI Body mass index is 25.03 kg/m.  Wt Readings from Last 3 Encounters:  11/25/17 145 lb 12.8 oz (66.1 kg)  09/22/17 146 lb (66.2 kg)  06/23/17 146 lb (66.2 kg)    General: Pleasant. Elderly. She is alert and in no acute distress.  Color is somewhat sallow to me.  HEENT: Normal.  Neck: Supple, no JVD, carotid bruits, or masses noted.  Cardiac: Irregular rhythm. Rate is ok. No edema.  Respiratory:  Lungs are clear to auscultation bilaterally with normal work of breathing.  GI: Soft and nontender.  MS: No deformity or atrophy. Gait and ROM intact. Using a cane.   Skin: Warm and dry. Color is normal.  Neuro:  Strength and sensation are intact  and no gross focal deficits noted.  Psych: Alert, appropriate and with normal affect.   LABORATORY DATA:  EKG:  EKG is ordered today. This shows NSR today.   Lab Results  Component Value Date   WBC 6.8 01/26/2017   HGB 12.6 01/26/2017   HCT 40.3 01/26/2017   PLT 270 01/26/2017   GLUCOSE 111 (H) 03/25/2017   CHOL 179 07/09/2014   TRIG 66 07/09/2014   HDL 67 07/09/2014   LDLCALC 99 07/09/2014   ALT 10 (L) 01/26/2017   AST 23 01/26/2017   NA 139 03/25/2017   K 4.6 03/25/2017   CL 96 03/25/2017   CREATININE 0.97 03/25/2017   BUN 14 03/25/2017   CO2 29 03/25/2017   TSH 0.522 01/26/2017   INR 1.10 07/08/2014   HGBA1C 6.0 (H) 07/09/2014      BNP (last 3 results) No results for input(s): BNP in the last 8760 hours.  ProBNP (last 3 results) No results for input(s): PROBNP in the last 8760 hours.   Other Studies Reviewed Today:  Echo Study Conclusions 01/2017  - Left ventricle: The cavity size was normal. Wall thickness was   increased in a pattern of mild LVH. There was mild focal basal   hypertrophy of the septum. Systolic function was vigorous. The   estimated ejection fraction was in the range of 65% to 70%. Wall   motion was normal; there were no regional wall motion   abnormalities. Doppler parameters are consistent with abnormal   left ventricular relaxation (grade 1 diastolic dysfunction). - Mitral valve: Calcified annulus. - Left atrium: The atrium was mildly dilated.  Impressions:  - Vigorous LV systolic function; mild LVH with proximal septal   thickening; mild diastolic dysfunction; mild LAE; mild TR.   Assessment/Plan:  1. Palpitations/irregular heart beat - she is in NSR on exam. I have elected to leave her on her current regimen.   2. HTN - BP is ok on current regimen.   3. COPD/bronchiectasis - followed by pulmonary - this seems to be her primary issue.  4. Advanced age  29. CAD - managed conservatively. No active symptoms.   6.  Possible prior TIA  7. Anxiety/depression/panic attacks  Current medicines are reviewed with the patient today.  The patient does not have concerns regarding medicines other than what has been noted above.  The following changes have been made:  See above.  Labs/ tests ordered today include:    Orders Placed This Encounter  Procedures  . EKG 12-Lead     Disposition:   FU with Dr. Acie Fredrickson in 6 months. I am happy to see back as needed.    Patient is agreeable to this plan and will call if any problems develop in the interim.   SignedTruitt Merle, NP  11/25/2017 2:18 PM  [facility] [address] [place], [place]  [address] Phone: [phone] Fax: [phone]

## 2017-11-25 NOTE — Patient Instructions (Addendum)
We will be checking the following labs today - NONE   Medication Instructions:    Continue with your current medicines.    If you need a refill on your cardiac medications before your next appointment, please call your pharmacy.     Testing/Procedures To Be Arranged:  N/A  Follow-Up:   See Dr. Elease HashimotoNahser in 6 months    At [facility], you and your health needs are our priority.  As part of our continuing mission to provide you with exceptional heart care, we have created designated Provider Care Teams.  These Care Teams include your primary Cardiologist (physician) and Advanced Practice Providers (APPs -  Physician Assistants and Nurse Practitioners) who all work together to provide you with the care you need, when you need it.  Special Instructions:  . None  Call the [facility] office at [phone] if you have any questions, problems or concerns.

## 2017-12-09 ENCOUNTER — Other Ambulatory Visit: Payer: Self-pay

## 2017-12-09 MED ORDER — CLOPIDOGREL BISULFATE 75 MG PO TABS: 75.0000 mg | ORAL_TABLET | Freq: Every day | ORAL | 11 refills | Status: DC

## 2017-12-09 MED ORDER — HYDROCHLOROTHIAZIDE 12.5 MG PO CAPS: 12.5000 mg | ORAL_CAPSULE | Freq: Every day | ORAL | 3 refills | Status: DC

## 2017-12-16 ENCOUNTER — Encounter: Payer: Self-pay | Admitting: Podiatry

## 2017-12-16 ENCOUNTER — Ambulatory Visit (INDEPENDENT_AMBULATORY_CARE_PROVIDER_SITE_OTHER): Payer: Medicare Other | Admitting: Podiatry

## 2017-12-16 DIAGNOSIS — M79674 Pain in right toe(s): Secondary | ICD-10-CM | POA: Diagnosis not present

## 2017-12-16 DIAGNOSIS — B351 Tinea unguium: Secondary | ICD-10-CM

## 2017-12-16 DIAGNOSIS — M79675 Pain in left toe(s): Secondary | ICD-10-CM | POA: Diagnosis not present

## 2017-12-22 NOTE — Progress Notes (Signed)
Subjective: 84 y.o. returns the office today for painful, elongated, thickened toenails which she cannot trim herself. Denies any redness or drainage around the nails.  She also has a callus on the left fourth toe.  Denies any acute changes since last appointment and no new complaints today. Denies any systemic complaints such as fevers, chills, nausea, vomiting.   PCP: [name], [name], MD  Objective: AAO 3, NAD DP/PT pulses palpable, CRT less than 3 seconds Nails hypertrophic, dystrophic, elongated, brittle, discolored 10. There is tenderness overlying the nails 1-5 bilaterally. There is no surrounding erythema or drainage along the nail sites. Hyperkeratotic lesion left fourth toe.  Upon debridement there is no underlying ulceration, drainage or any clinical signs of infection noted today. No open lesions or pre-ulcerative lesions are identified. Hammertoes are present. No pain with calf compression, swelling, warmth, erythema.  Assessment: Patient presents with symptomatic onychomycosis, hyperkeratotic lesion  Plan: -Treatment options including alternatives, risks, complications were discussed -Nails sharply debrided 10 without complication/bleeding. -Hyperkeratotic lesion sharply debrided x1 without any complications or bleeding. -Discussed daily foot inspection. If there are any changes, to call the office immediately.  -Follow-up in 3 months or sooner if any problems are to arise. In the meantime, encouraged to call the office with any questions, concerns, changes symptoms.  Ovid CurdMatthew Wagoner, DPM

## 2017-12-24 ENCOUNTER — Telehealth: Payer: Self-pay | Admitting: Nurse Practitioner

## 2017-12-24 NOTE — Telephone Encounter (Signed)
Returned [name]'s call re: Atorvastatin. Pt is not supposed to be on this medication and daughter will stop it.

## 2017-12-24 NOTE — Telephone Encounter (Signed)
New Message:      Daughter wants to know if pt is supposed to be taking Lipitor?

## 2018-01-02 ENCOUNTER — Ambulatory Visit: Payer: Self-pay | Admitting: Emergency Medicine

## 2018-01-14 DIAGNOSIS — I69991 Dysphagia following unspecified cerebrovascular disease: Secondary | ICD-10-CM | POA: Insufficient documentation

## 2018-01-19 ENCOUNTER — Ambulatory Visit: Payer: Self-pay | Admitting: Emergency Medicine

## 2018-01-20 ENCOUNTER — Ambulatory Visit: Payer: Self-pay | Admitting: Emergency Medicine

## 2018-01-22 ENCOUNTER — Encounter: Payer: Self-pay | Admitting: Emergency Medicine

## 2018-01-22 ENCOUNTER — Ambulatory Visit (INDEPENDENT_AMBULATORY_CARE_PROVIDER_SITE_OTHER): Payer: Medicare Other | Admitting: Emergency Medicine

## 2018-01-22 DIAGNOSIS — J479 Bronchiectasis, uncomplicated: Secondary | ICD-10-CM | POA: Diagnosis not present

## 2018-01-22 NOTE — Patient Instructions (Addendum)
Please continue your current medications as you have been taking them including your rotating antibiotics and prednisone 10 mg daily Continue albuterol as needed to help you with secretion clearance or for shortness of breath. Continue your flutter valve and chest vest as you need them to help you with your daily secretion clearance. Follow with Dr Delton Coombes in 3 months or sooner if you have any problems.

## 2018-01-22 NOTE — Assessment & Plan Note (Signed)
Please continue your current medications as you have been taking them including your rotating antibiotics and prednisone 10 mg daily Continue albuterol as needed to help you with secretion clearance or for shortness of breath. Continue your flutter valve and chest vest as you need them to help you with your daily secretion clearance. Follow with Dr Byrum in 3 months or sooner if you have any problems.  

## 2018-01-22 NOTE — Progress Notes (Signed)
Subjective:    Patient ID: Sarah [name], female    DOB: 25-Apr-1921, 85 y.o.   MRN: [id number]  HPI                                                                                                                                                                                      ROV 06/23/2017 --pleasant 85 year old woman who has bronchiectasis and asthmatic bronchitis with chronic upper airway irritation and chronic multifactorial cough.  She is on prednisone 10 mg daily as well as rotating antibiotics (doxycycline, azithromycin, Cefdinir).  She does chest vest approximately qd. She is on loratadine, hypertonic saline nebs qd. She uses albuterol about once a day. She stopped her doxy recently as she was concerned that she may have developed facial edema on it. Mucous is fairly thin, no plugs or blood.   ROV 09/22/17 --Sarah [name] is 85 and has a history of bronchiectasis with chronic asthmatic bronchitis, chronic multifactorial cough with upper airway irritation syndrome.  We have managed her on rotating antibiotics (doxycycline, azithromycin, Cefdinir) but she had to come off doxycycline due to mouth and lip swelling. Prednisone 5mg  daily. She uses albuterol about 1-2x a day, helps w secretion clearance. She uses vibra-vest daily, does help w secretions intermittently. No longer on hypertonic nebs.  She took her azithro 5 days early this month due to increased mucous production. She feels at baseline right now.   ROV 01/22/18 --pleasant 85 year old woman with bronchiectasis, chronic asthmatic bronchitis, chronic multifactorial cough.  She has been using prednisone 10 mg daily, chest vibration vest - less frequently than before.  Rotating antibiotics now azithromycin and cefdinir.  She has to clear secretions about 2x a day - time consuming, takes about 1 hour.    Review of Systems As above       Objective:   Physical Exam  Vitals:   01/22/18 1046  BP: 110/72  Pulse: 82  SpO2: 92%  Weight: 140  lb (63.5 kg)  Height: 5' 4.25" (1.632 m)    GEN: A/Ox3; pleasant , NAD, elderly and thin   HEENT:  Op clear,   NECK:  No stridor  RESP distant, clear bilaterally, few scattered insp squeaks, crackles. No wheeze.   CARD:  Regular, no M  Musco: Warm bil, no deformities or joint swelling noted.   Neuro: alert, no focal deficits noted.    Skin: Warm, no lesions or rashes      Assessment & Plan:  BRONCHIECTASIS Please continue your current medications as you have been taking them including your rotating antibiotics and prednisone 10 mg daily Continue albuterol as needed to help you with secretion clearance or  for shortness of breath. Continue your flutter valve and chest vest as you need them to help you with your daily secretion clearance. Follow with Dr Delton Coombes in 3 months or sooner if you have any problems.  Levy Pupa, MD, PhD 01/22/2018, 11:16 AM [facility] [phone] or if no answer [phone]

## 2018-03-12 DIAGNOSIS — I214 Non-ST elevation (NSTEMI) myocardial infarction: Secondary | ICD-10-CM | POA: Diagnosis not present

## 2018-03-12 DIAGNOSIS — A318 Other mycobacterial infections: Secondary | ICD-10-CM | POA: Diagnosis not present

## 2018-03-12 DIAGNOSIS — E559 Vitamin D deficiency, unspecified: Secondary | ICD-10-CM | POA: Diagnosis not present

## 2018-03-12 DIAGNOSIS — I10 Essential (primary) hypertension: Secondary | ICD-10-CM | POA: Diagnosis not present

## 2018-03-12 DIAGNOSIS — I639 Cerebral infarction, unspecified: Secondary | ICD-10-CM | POA: Diagnosis not present

## 2018-03-12 DIAGNOSIS — R3989 Other symptoms and signs involving the genitourinary system: Secondary | ICD-10-CM | POA: Diagnosis not present

## 2018-03-12 DIAGNOSIS — H35033 Hypertensive retinopathy, bilateral: Secondary | ICD-10-CM | POA: Diagnosis not present

## 2018-03-12 DIAGNOSIS — F324 Major depressive disorder, single episode, in partial remission: Secondary | ICD-10-CM | POA: Diagnosis not present

## 2018-03-12 DIAGNOSIS — Z79899 Other long term (current) drug therapy: Secondary | ICD-10-CM | POA: Diagnosis not present

## 2018-03-12 DIAGNOSIS — N183 Chronic kidney disease, stage 3 (moderate): Secondary | ICD-10-CM | POA: Diagnosis not present

## 2018-03-12 DIAGNOSIS — R739 Hyperglycemia, unspecified: Secondary | ICD-10-CM | POA: Diagnosis not present

## 2018-03-16 ENCOUNTER — Ambulatory Visit (INDEPENDENT_AMBULATORY_CARE_PROVIDER_SITE_OTHER): Payer: Medicare Other | Admitting: Primary Care

## 2018-03-16 ENCOUNTER — Encounter: Payer: Self-pay | Admitting: Primary Care

## 2018-03-16 ENCOUNTER — Ambulatory Visit (INDEPENDENT_AMBULATORY_CARE_PROVIDER_SITE_OTHER)
Admission: RE | Admit: 2018-03-16 | Discharge: 2018-03-16 | Disposition: A | Discharge status: Home / Self Care | Payer: Medicare Other | Source: Ambulatory Visit | Attending: Primary Care | Admitting: Primary Care

## 2018-03-16 VITALS — BP 118/72 | HR 89 | Temp 97.6°F | Ht 64.0 in | Wt 136.8 lb

## 2018-03-16 DIAGNOSIS — J479 Bronchiectasis, uncomplicated: Secondary | ICD-10-CM

## 2018-03-16 DIAGNOSIS — J181 Lobar pneumonia, unspecified organism: Secondary | ICD-10-CM

## 2018-03-16 DIAGNOSIS — J189 Pneumonia, unspecified organism: Secondary | ICD-10-CM | POA: Insufficient documentation

## 2018-03-16 DIAGNOSIS — R042 Hemoptysis: Secondary | ICD-10-CM | POA: Diagnosis not present

## 2018-03-16 LAB — CBC WITH DIFFERENTIAL/PLATELET
Basophils Absolute: 0 10*3/uL (ref 0.0–0.1)
Basophils Relative: 0.3 % (ref 0.0–3.0)
EOS PCT: 0.4 % (ref 0.0–5.0)
Eosinophils Absolute: 0 10*3/uL (ref 0.0–0.7)
HCT: 33.5 % — ABNORMAL LOW (ref 36.0–46.0)
Hemoglobin: 10.5 g/dL — ABNORMAL LOW (ref 12.0–15.0)
Lymphocytes Relative: 5.7 % — ABNORMAL LOW (ref 12.0–46.0)
Lymphs Abs: 0.7 10*3/uL (ref 0.7–4.0)
MCHC: 31.3 g/dL (ref 30.0–36.0)
MCV: 84.9 fl (ref 78.0–100.0)
Monocytes Absolute: 0.9 10*3/uL (ref 0.1–1.0)
Monocytes Relative: 7 % (ref 3.0–12.0)
Neutro Abs: 10.6 10*3/uL — ABNORMAL HIGH (ref 1.4–7.7)
Neutrophils Relative %: 86.6 % — ABNORMAL HIGH (ref 43.0–77.0)
Platelets: 336 10*3/uL (ref 150.0–400.0)
RBC: 3.95 Mil/uL (ref 3.87–5.11)
RDW: 15 % (ref 11.5–15.5)
WBC: 12.3 10*3/uL — ABNORMAL HIGH (ref 4.0–10.5)

## 2018-03-16 LAB — RESPIRATORY VIRUS PANEL
Influenza A RNA: NOT DETECTED
Influenza B RNA: NOT DETECTED
RSV RNA: NOT DETECTED
hMPV: NOT DETECTED

## 2018-03-16 LAB — BASIC METABOLIC PANEL
BUN: 14 mg/dL (ref 6–23)
CO2: 31 mEq/L (ref 19–32)
Calcium: 9.1 mg/dL (ref 8.4–10.5)
Chloride: 94 mEq/L — ABNORMAL LOW (ref 96–112)
Creatinine, Ser: 0.87 mg/dL (ref 0.40–1.20)
GFR: 60.48 mL/min (ref >60.00)
Glucose, Bld: 124 mg/dL — ABNORMAL HIGH (ref 70–99)
Potassium: 4.1 mEq/L (ref 3.5–5.1)
Sodium: 133 mEq/L — ABNORMAL LOW (ref 135–145)

## 2018-03-16 MED ORDER — CEFPODOXIME PROXETIL 200 MG PO TABS: 200.0000 mg | ORAL_TABLET | Freq: Two times a day (BID) | ORAL | 0 refills | Status: DC

## 2018-03-16 MED ORDER — BENZONATATE 200 MG PO CAPS: 200.0000 mg | ORAL_CAPSULE | Freq: Three times a day (TID) | ORAL | 1 refills | Status: DC | PRN

## 2018-03-16 NOTE — Assessment & Plan Note (Addendum)
-   CXR 03/16/2018 showed a new right lower lobe infiltrate - Previous sputum samples have grown pseudomonas. Patient has multiple allergies, discussed with Dr. Delton Coombes and recommended Cefpodoxime (pateint denies allergy to PCN, she has an intolerance to Augmentin reporting Diarrhea). Advised probiotic while taking abx.  - Recommend follow-up in 3 days

## 2018-03-16 NOTE — Progress Notes (Signed)
_0  ID: Sarah [name], female    DOB: April 21, 1921, 85 y.o.   MRN: [id number]  Chief Complaint  Patient presents with  . Acute Visit    coughing up blood since Friday. fever, congestion.     Referring provider: [name], MD  HPI: 85 year old female, never smoked. PMH significant for bronchiectasis, asthmatic bronchitis with chronic upper airway irritation, chronic cough. Maintained on daily prednisone 34m, hypertonic saline nebulizers, chest vest and rotating antibiotics (azithromycin and cefdinir).   03/16/2018 Patient presents today for an acute visit with complaints hemoptysis x 3 days. Accompanied by her daughter Sarah [name] Lives at independent living. Reports one occurrence of fever 102, today her temperature is 97.6. She feels more tired. Short of breath with exertion, needs to stop to rest. Taking over the counter cough medication. Used her chest therapy vest yesterday. Coughing up a moderate amount of bright red blood. Complains of right pleuritic pain.    Allergies  Allergen Reactions  . Codeine Anaphylaxis  . Hydrocodone Nausea And Vomiting and Other (See Comments)    SYNCOPE AND BRADYCARDIA  . Isosorbide Nitrate Other (See Comments)    BRADYCARDIA  . Oxycodone Palpitations    Rapid heart beat  . Clarithromycin Nausea And Vomiting    Has patient had a PCN reaction causing immediate rash, facial/tongue/throat swelling, SOB or lightheadedness with hypotension: Unknown Has patient had a PCN reaction causing severe rash involving mucus membranes or skin necrosis: Unknown Has patient had a PCN reaction that required hospitalization: Unknown Has patient had a PCN reaction occurring within the last 10 years: Unknown If all of the above answers are "NO", then may proceed with Cephalosporin use.   . Fiorinal [Butalbital-Aspirin-Caffeine] Swelling and Other (See Comments)    Facial swelling   . Levofloxacin Nausea And Vomiting and Other (See Comments)    SYNCOPE ALSO    . Augmentin [Amoxicillin-Pot Clavulanate] Diarrhea  . Doxycycline     Sweats and aches    Immunization History  Administered Date(s) Administered  . Influenza Split 10/15/2011, 10/14/2012  . Influenza Whole 10/17/2008, 11/14/2009, 11/13/2010  . Influenza, High Dose Seasonal PF 11/21/2015, 10/29/2016, 09/22/2017  . Influenza,inj,Quad PF,6+ Mos 09/17/2013, 10/07/2014  . Pneumococcal Polysaccharide-23 02/19/1999    Past Medical History:  Diagnosis Date  . Allergic rhinitis   . Anxiety    Anxiety attacks  . Arthritis    "a little; in my back and hands" (04/15/2012)  . Asthma   . Bronchiectasis    with history of Legionaires with chronic rales/rhonchi  . Chest pain at rest   . Daily headache    "over the last week" 04/15/2012   . Depression   . Diastolic dysfunction    a. per echo 08/2010 with normal LV function;  b. 06/2011 Echo: EF 65-70%  . GERD (gastroesophageal reflux disease)    "just recently" (04/15/2012)  . H/O hiatal hernia   . H/O Legionnaire's disease 11/1976  . History of blood transfusion 1972   "w/hysterectomy" (04/15/2012)  . Hypercholesteremia   . Hypertension    Negative renal duplex in December of 2012  . Hypothyroidism   . Idiopathic peripheral neuropathy   . MAC (mycobacterium avium-intracellulare complex)   . Migraines    "outgrew them" (04/15/2012)  . NSTEMI (non-ST elevated myocardial infarction) [place]    a. Normal coronaries per cath August 2012 and negative CT angio for PE;  b. 06/2011 Repeat admission w/ chest pain and elevated troponin's, CTA Chest w/o PE  . Osteopenia 02/2011  t score - 2.1  . Pneumonia    "recurrent" (04/15/2012)  . Pollen allergies   . Shortness of breath    "sometimes just lying down" (04/15/2012)  . Stroke [facility]) 06/2014   cerebellum    Tobacco History: Social History   Tobacco Use  Smoking Status Never Smoker  Smokeless Tobacco Never Used   Counseling given: Not Answered   Outpatient Medications Prior to Visit   Medication Sig Dispense Refill  . albuterol (PROVENTIL HFA;VENTOLIN HFA) 108 (90 Base) MCG/ACT inhaler Inhale 1-2 puffs into the lungs every 6 (six) hours as needed for wheezing or shortness of breath. 1 Inhaler 6  . albuterol (PROVENTIL) (2.5 MG/3ML) 0.083% nebulizer solution Take 3 mLs (2.5 mg total) by nebulization 2 (two) times daily. Take 1 vial twice a day. DX: J47.9 720 mL 3  . ALPRAZolam (XANAX) 0.25 MG tablet Take 0.25 mg by mouth 3 (three) times daily.    Marland Kitchen azithromycin (ZITHROMAX) 250 MG tablet TAKE 2 TABLET TODAY THEN TAKE 1 TABLET DAILY FOR 4 DAYS 6 tablet 2  . calcium carbonate (TUMS - DOSED IN MG ELEMENTAL CALCIUM) 500 MG chewable tablet Chew 1 tablet by mouth every 8 (eight) hours as needed for indigestion or heartburn.    . carvedilol (COREG) 6.25 MG tablet TAKE 1 TABLET(6.25 MG) BY MOUTH TWICE DAILY 180 tablet 3  . cefdinir (OMNICEF) 300 MG capsule Take 1 capsule (300 mg total) by mouth 2 (two) times daily. 60 capsule 0  . clopidogrel (PLAVIX) 75 MG tablet Take 1 tablet (75 mg total) by mouth daily. 30 tablet 11  . DULoxetine (CYMBALTA) 20 MG capsule Take 20 mg by mouth 3 (three) times daily.    Marland Kitchen escitalopram (LEXAPRO) 5 MG tablet Take 2.5 mg by mouth daily.    Marland Kitchen gabapentin (NEURONTIN) 100 MG capsule Take 200 mg by mouth 3 (three) times daily.    Marland Kitchen latanoprost (XALATAN) 0.005 % ophthalmic solution Place 1 drop into both eyes at bedtime.     Marland Kitchen levothyroxine (SYNTHROID, LEVOTHROID) 50 MCG tablet Take 50 mcg by mouth daily.      Marland Kitchen loratadine (CLARITIN) 10 MG tablet Take 10 mg by mouth daily.    . mupirocin ointment (BACTROBAN) 2 % Apply 1 application topically 2 (two) times daily. 30 g 2  . pantoprazole (PROTONIX) 40 MG tablet Take 1 capsule 1 hour before your first meal of the day 30 tablet 5  . predniSONE (DELTASONE) 10 MG tablet Take 5 mg by mouth daily with breakfast.     . Sennosides (SENOKOT PO) Take 1 tablet by mouth every other day.    Marland Kitchen VITAMIN D, CHOLECALCIFEROL, PO  Take 2 tablets by mouth daily.     . hydrochlorothiazide (MICROZIDE) 12.5 MG capsule Take 1 capsule (12.5 mg total) by mouth daily. 90 capsule 3   No facility-administered medications prior to visit.     Review of Systems  Review of Systems  Constitutional: Negative.   HENT: Negative.   Respiratory: Positive for cough. Negative for wheezing.   Cardiovascular: Negative.     Physical Exam  BP 118/72 (BP Location: Left Arm)   Pulse 89   Temp 97.6 F (36.4 C) (Oral)   Ht _0  (1.626 m)   Wt 136 lb 12.8 oz (62.1 kg)   LMP 01/14/1970   SpO2 93%   BMI 23.48 kg/m  Physical Exam Constitutional:      Appearance: Normal appearance.  HENT:     Right Ear: Tympanic membrane normal.  Left Ear: Tympanic membrane normal.     Mouth/Throat:     Mouth: Mucous membranes are moist.     Pharynx: Oropharynx is clear.  Pulmonary:     Effort: Pulmonary effort is normal. No respiratory distress.     Breath sounds: No wheezing.     Comments: Coarse crackles to bilateral bases, no resp distress Neurological:     General: No focal deficit present.     Mental Status: She is alert and oriented to person, place, and time. Mental status is at baseline.  Psychiatric:        Mood and Affect: Mood normal.        Behavior: Behavior normal.        Thought Content: Thought content normal.        Judgment: Judgment normal.      Lab Results:  CBC    Component Value Date/Time   WBC 12.3 (H) 03/16/2018 1220   RBC 3.95 03/16/2018 1220   HGB 10.5 (L) 03/16/2018 1220   HCT 33.5 (L) 03/16/2018 1220   PLT 336.0 03/16/2018 1220   MCV 84.9 03/16/2018 1220   MCH 29.4 01/26/2017 0647   MCHC 31.3 03/16/2018 1220   RDW 15.0 03/16/2018 1220   LYMPHSABS 0.7 03/16/2018 1220   MONOABS 0.9 03/16/2018 1220   EOSABS 0.0 03/16/2018 1220   BASOSABS 0.0 03/16/2018 1220    BMET    Component Value Date/Time   NA 133 (L) 03/16/2018 1220   NA 139 03/25/2017 1216   K 4.1 03/16/2018 1220   CL 94 (L)  03/16/2018 1220   CO2 31 03/16/2018 1220   GLUCOSE 124 (H) 03/16/2018 1220   BUN 14 03/16/2018 1220   BUN 14 03/25/2017 1216   CREATININE 0.87 03/16/2018 1220   CALCIUM 9.1 03/16/2018 1220   GFRNONAA 50 (L) 03/25/2017 1216   GFRAA 58 (L) 03/25/2017 1216    BNP No results found for: BNP  ProBNP    Component Value Date/Time   PROBNP 570.4 (H) 08/01/2012 2137    Imaging: Dg Chest 2 View  Result Date: 03/16/2018 CLINICAL DATA:  Bronchiectasis follow-up EXAM: CHEST - 2 VIEW COMPARISON:  01/26/2017 FINDINGS: Cardiac shadow is within normal limits. Hiatal hernia is again identified. The previously seen bronchiectatic changes in the left lung base medially are less well appreciated on today's exam. Right middle lobe infiltrate is seen new from the prior exam. Stable scarring in the right upper lobe is noted. No acute bony abnormality is seen. IMPRESSION: New right middle lobe infiltrate. Stable hiatal hernia. No other acute abnormality is noted. Electronically Signed   By: Inez Catalina M.D.   On: 03/16/2018 12:04     Assessment & Plan:   85 year old female, never smoked. PMH significant for bronchiectasis. Patient presents today with acute complaints of hemoptysis x3 days with associated fever and right pleuritic pain. Currently takes prednisone 38m daily and rotating antibiotics (azithromycin and cefdinir). Used therapy chest vest yesterday. Coughing up a moderate amount of bright red blood. CXR in office showed new right lower lobe infiltrate. Previous sputum samples have grown out pseudomonas (sensitive to Ciprofloxacin, Levaquin, Cefepime). Patient has multiple allergies, reports severe N/V with Levaquin. Sending in RX cefpodoxime and recommend patient take probiotic. Advised patient hold plavix until follow-up. She has a hx of multiple TIAs and NSTEMI with normal coronary arteries in 2012(no stents). Discussed risk versus benefit from holding plavix and daughter understands and is agreeing  with plan. Respiratory viral panel negative. WBC  slightly elevated at 12.3. Follow up in 3-4 days.   Right lower lobe pneumonia ([place]) - CXR 03/16/2018 showed a new right lower lobe infiltrate - Previous sputum samples have grown pseudomonas. Patient has multiple allergies, discussed with [name] and recommended Cefpodoxime (pateint denies allergy to PCN, she has an intolerance to Augmentin reporting Diarrhea). Advised probiotic while taking abx.  - Recommend follow-up in 3 days   Hemoptysis - Complains of hemoptysis x 3 days with associated fever and right pleuritic pain - Hemodynamically stable, O2 sat 93% RA  - Checking labs and CXR today - Advised patient hold Plavix until follow-up visit, discussed benefit vs risk  - Holding chest therapy vest until hemoptysis has improved  - Recommend delsym cough syrup and tessalon Perles for cough  - Hgb 10.5 at patients baseline, plt normal    Martyn Ehrich, NP 03/16/2018

## 2018-03-16 NOTE — Assessment & Plan Note (Addendum)
-   Complains of hemoptysis x 3 days with associated fever and right pleuritic pain - Hemodynamically stable, O2 sat 93% RA  - Checking labs and CXR today - Advised patient hold Plavix until follow-up visit, discussed benefit vs risk  - Holding chest therapy vest until hemoptysis has improved  - Recommend delsym cough syrup and tessalon Perles for cough  - Hgb 10.5 at patients baseline, plt normal

## 2018-03-16 NOTE — Patient Instructions (Addendum)
Orders: - CXR and labs today  - Respiratory viral panel   Recommendations: - Hold PLAVIX until follow-up - Delsym cough syrup twice daily - Tessalon perles every 8 hours as needed for cough suppression  Rx: - Cefpodoxime 200mg  twice daily x 1 week (take pro-biotic daily to prevent diarrhea and take with food) - Tessalon perles as needed three times a day for cough  Follow up: 3-4 days with Waynetta Sandy NP or Dr. Delton Coombes

## 2018-03-17 ENCOUNTER — Ambulatory Visit: Payer: Self-pay | Admitting: Emergency Medicine

## 2018-03-17 ENCOUNTER — Ambulatory Visit: Payer: Medicare Other | Admitting: Podiatry

## 2018-03-19 ENCOUNTER — Ambulatory Visit: Payer: Self-pay | Admitting: Primary Care

## 2018-03-20 ENCOUNTER — Encounter: Payer: Self-pay | Admitting: Primary Care

## 2018-03-20 ENCOUNTER — Ambulatory Visit (INDEPENDENT_AMBULATORY_CARE_PROVIDER_SITE_OTHER): Payer: Medicare Other | Admitting: Primary Care

## 2018-03-20 DIAGNOSIS — R042 Hemoptysis: Secondary | ICD-10-CM

## 2018-03-20 MED ORDER — PREDNISONE 5 MG PO TABS: ORAL_TABLET | ORAL | 0 refills | Status: AC

## 2018-03-20 NOTE — Assessment & Plan Note (Addendum)
-   Some clinical improvement, hemoptysis has resolved - Insp wheeze on exam with mild dyspnea  - START taking 20mg  prednisone x 5 days; then 10mg  x 5 days; then resume 5mg  daily  - Continue Cefpodoxiume until completed  - Use Incentive spirometer every hour - Monitor and notify office for fever >100.5 or return of hemoptysis - Follow up in 5-7 days with NP or Dr. Delton Coombes

## 2018-03-20 NOTE — Patient Instructions (Addendum)
Resume plavix tomorrow   START taking 20mg  prednisone x 5 days; then 10mg  x 5 days; then resume 5mg  daily   Continue antibiotic until completed   Incentive spirometer every hour  Monitor for fever >100.5 or blood in mucus - notify office   Follow up in 5-7 days

## 2018-03-20 NOTE — Assessment & Plan Note (Signed)
-   Resolved, likely related to bronchiectasis and new RLL pneumonia - Ok to resume plavix tomorrow - Instructed to notify if symptoms return

## 2018-03-20 NOTE — Progress Notes (Signed)
_0  ID: Sarah [name], female    DOB: 09/05/1921, 85 y.o.   MRN: [id number]  Chief Complaint  Patient presents with  . Follow-up    follows for hemoptysis     Referring provider: [name], MD  HPI: 85 year old female, never smoked. PMH significant for bronchiectasis, asthmatic bronchitis with chronic upper airway irritation, chronic cough. Maintained on daily prednisone 55m, hypertonic saline nebulizers, chest vest and rotating antibiotics (azithromycin and cefdinir).   03/16/2018-  Acute, Hemotysis Patient presents today for an acute visit with complaints hemoptysis x 3 days. Accompanied by her daughter Sarah [name] Lives at independent living. Reports one occurrence of fever 102, today her temperature is 97.6. She feels more tired. Short of breath with exertion, needs to stop to rest. Taking over the counter cough medication. Used her chest therapy vest yesterday. Coughing up a moderate amount of bright red blood. Complains of right pleuritic pain. CXR showed new right lower lobe infiltrate, started on Cefpodoxime d/t multiple drug allergies  03/20/2018- Follow-up, Pneumonia/hemoptysis  Patient presents today for 4 day follow-up for new RLL pneumonia. Accompanied by her daughter Sarah [name] Patient has been taking Cefpodoxiume as prescribed and tolerating medication with no issues. Hemoptysis and right pleuritic pain have resolved. Reports of fatigue and shortness of breath. Eating very well and taking probiotic supplement. Using [employer] brand cough syrup with improvement. Patient and daughter continue to want to be treated outpatient.   Allergies  Allergen Reactions  . Codeine Anaphylaxis  . Hydrocodone Nausea And Vomiting and Other (See Comments)    SYNCOPE AND BRADYCARDIA  . Isosorbide Nitrate Other (See Comments)    BRADYCARDIA  . Oxycodone Palpitations    Rapid heart beat  . Clarithromycin Nausea And Vomiting    Has patient had a PCN reaction causing immediate rash,  facial/tongue/throat swelling, SOB or lightheadedness with hypotension: Unknown Has patient had a PCN reaction causing severe rash involving mucus membranes or skin necrosis: Unknown Has patient had a PCN reaction that required hospitalization: Unknown Has patient had a PCN reaction occurring within the last 10 years: Unknown If all of the above answers are "NO", then may proceed with Cephalosporin use.   . Fiorinal [Butalbital-Aspirin-Caffeine] Swelling and Other (See Comments)    Facial swelling   . Levofloxacin Nausea And Vomiting and Other (See Comments)    SYNCOPE ALSO  . Augmentin [Amoxicillin-Pot Clavulanate] Diarrhea  . Doxycycline     Sweats and aches    Immunization History  Administered Date(s) Administered  . Influenza Split 10/15/2011, 10/14/2012  . Influenza Whole 10/17/2008, 11/14/2009, 11/13/2010  . Influenza, High Dose Seasonal PF 11/21/2015, 10/29/2016, 09/22/2017  . Influenza,inj,Quad PF,6+ Mos 09/17/2013, 10/07/2014  . Pneumococcal Polysaccharide-23 02/19/1999    Past Medical History:  Diagnosis Date  . Allergic rhinitis   . Anxiety    Anxiety attacks  . Arthritis    "a little; in my back and hands" (04/15/2012)  . Asthma   . Bronchiectasis    with history of Legionaires with chronic rales/rhonchi  . Chest pain at rest   . Daily headache    "over the last week" 04/15/2012   . Depression   . Diastolic dysfunction    a. per echo 08/2010 with normal LV function;  b. 06/2011 Echo: EF 65-70%  . GERD (gastroesophageal reflux disease)    "just recently" (04/15/2012)  . H/O hiatal hernia   . H/O Legionnaire's disease 11/1976  . History of blood transfusion 1972   "w/hysterectomy" (04/15/2012)  . Hypercholesteremia   .  Hypertension    Negative renal duplex in December of 2012  . Hypothyroidism   . Idiopathic peripheral neuropathy   . MAC (mycobacterium avium-intracellulare complex)   . Migraines    "outgrew them" (04/15/2012)  . NSTEMI (non-ST elevated myocardial  infarction) ([place])    a. Normal coronaries per cath August 2012 and negative CT angio for PE;  b. 06/2011 Repeat admission w/ chest pain and elevated troponin's, CTA Chest w/o PE  . Osteopenia 02/2011   t score - 2.1  . Pneumonia    "recurrent" (04/15/2012)  . Pollen allergies   . Shortness of breath    "sometimes just lying down" (04/15/2012)  . Stroke [facility]) 06/2014   cerebellum    Tobacco History: Social History   Tobacco Use  Smoking Status Never Smoker  Smokeless Tobacco Never Used   Counseling given: Not Answered   Outpatient Medications Prior to Visit  Medication Sig Dispense Refill  . albuterol (PROVENTIL HFA;VENTOLIN HFA) 108 (90 Base) MCG/ACT inhaler Inhale 1-2 puffs into the lungs every 6 (six) hours as needed for wheezing or shortness of breath. 1 Inhaler 6  . albuterol (PROVENTIL) (2.5 MG/3ML) 0.083% nebulizer solution Take 3 mLs (2.5 mg total) by nebulization 2 (two) times daily. Take 1 vial twice a day. DX: J47.9 720 mL 3  . ALPRAZolam (XANAX) 0.25 MG tablet Take 0.25 mg by mouth 3 (three) times daily.    Marland Kitchen azithromycin (ZITHROMAX) 250 MG tablet TAKE 2 TABLET TODAY THEN TAKE 1 TABLET DAILY FOR 4 DAYS 6 tablet 2  . benzonatate (TESSALON) 200 MG capsule Take 1 capsule (200 mg total) by mouth 3 (three) times daily as needed for cough. 30 capsule 1  . calcium carbonate (TUMS - DOSED IN MG ELEMENTAL CALCIUM) 500 MG chewable tablet Chew 1 tablet by mouth every 8 (eight) hours as needed for indigestion or heartburn.    . carvedilol (COREG) 6.25 MG tablet TAKE 1 TABLET(6.25 MG) BY MOUTH TWICE DAILY 180 tablet 3  . cefdinir (OMNICEF) 300 MG capsule Take 1 capsule (300 mg total) by mouth 2 (two) times daily. 60 capsule 0  . cefpodoxime (VANTIN) 200 MG tablet Take 1 tablet (200 mg total) by mouth 2 (two) times daily. 14 tablet 0  . clopidogrel (PLAVIX) 75 MG tablet Take 1 tablet (75 mg total) by mouth daily. 30 tablet 11  . DULoxetine (CYMBALTA) 20 MG capsule Take 20 mg by mouth 3  (three) times daily.    Marland Kitchen escitalopram (LEXAPRO) 5 MG tablet Take 2.5 mg by mouth daily.    Marland Kitchen gabapentin (NEURONTIN) 100 MG capsule Take 200 mg by mouth 3 (three) times daily.    Marland Kitchen latanoprost (XALATAN) 0.005 % ophthalmic solution Place 1 drop into both eyes at bedtime.     Marland Kitchen levothyroxine (SYNTHROID, LEVOTHROID) 50 MCG tablet Take 50 mcg by mouth daily.      Marland Kitchen loratadine (CLARITIN) 10 MG tablet Take 10 mg by mouth daily.    . mupirocin ointment (BACTROBAN) 2 % Apply 1 application topically 2 (two) times daily. 30 g 2  . pantoprazole (PROTONIX) 40 MG tablet Take 1 capsule 1 hour before your first meal of the day 30 tablet 5  . predniSONE (DELTASONE) 10 MG tablet Take 5 mg by mouth daily with breakfast.     . Sennosides (SENOKOT PO) Take 1 tablet by mouth every other day.    Marland Kitchen VITAMIN D, CHOLECALCIFEROL, PO Take 2 tablets by mouth daily.     . hydrochlorothiazide (MICROZIDE) 12.5  MG capsule Take 1 capsule (12.5 mg total) by mouth daily. 90 capsule 3   No facility-administered medications prior to visit.    Review of Systems  Review of Systems  Constitutional: Positive for fatigue. Negative for fever.  HENT: Negative.   Respiratory: Positive for cough and shortness of breath.   Cardiovascular: Negative.    Physical Exam  BP 116/68 (BP Location: Left Arm)   Pulse 87   Ht _0  (1.651 m)   Wt 137 lb 6.4 oz (62.3 kg)   LMP 01/14/1970   SpO2 90%   BMI 22.86 kg/m  Physical Exam Constitutional:      General: She is not in acute distress. HENT:     Head: Normocephalic.     Right Ear: Tympanic membrane normal.     Left Ear: Tympanic membrane normal.  Neck:     Musculoskeletal: Normal range of motion and neck supple.  Cardiovascular:     Rate and Rhythm: Regular rhythm.  Pulmonary:     Breath sounds: Wheezing and rales present.  Musculoskeletal:     Comments: In [place]  Skin:    General: Skin is warm and dry.  Neurological:     General: No focal deficit present.     Mental  Status: She is alert and oriented to person, place, and time. Mental status is at baseline.  Psychiatric:        Mood and Affect: Mood normal.        Behavior: Behavior normal.        Thought Content: Thought content normal.        Judgment: Judgment normal.      Lab Results:  CBC    Component Value Date/Time   WBC 12.3 (H) 03/16/2018 1220   RBC 3.95 03/16/2018 1220   HGB 10.5 (L) 03/16/2018 1220   HCT 33.5 (L) 03/16/2018 1220   PLT 336.0 03/16/2018 1220   MCV 84.9 03/16/2018 1220   MCH 29.4 01/26/2017 0647   MCHC 31.3 03/16/2018 1220   RDW 15.0 03/16/2018 1220   LYMPHSABS 0.7 03/16/2018 1220   MONOABS 0.9 03/16/2018 1220   EOSABS 0.0 03/16/2018 1220   BASOSABS 0.0 03/16/2018 1220    BMET    Component Value Date/Time   NA 133 (L) 03/16/2018 1220   NA 139 03/25/2017 1216   K 4.1 03/16/2018 1220   CL 94 (L) 03/16/2018 1220   CO2 31 03/16/2018 1220   GLUCOSE 124 (H) 03/16/2018 1220   BUN 14 03/16/2018 1220   BUN 14 03/25/2017 1216   CREATININE 0.87 03/16/2018 1220   CALCIUM 9.1 03/16/2018 1220   GFRNONAA 50 (L) 03/25/2017 1216   GFRAA 58 (L) 03/25/2017 1216    BNP No results found for: BNP  ProBNP    Component Value Date/Time   PROBNP 570.4 (H) 08/01/2012 2137    Imaging: Dg Chest 2 View  Result Date: 03/16/2018 CLINICAL DATA:  Bronchiectasis follow-up EXAM: CHEST - 2 VIEW COMPARISON:  01/26/2017 FINDINGS: Cardiac shadow is within normal limits. Hiatal hernia is again identified. The previously seen bronchiectatic changes in the left lung base medially are less well appreciated on today's exam. Right middle lobe infiltrate is seen new from the prior exam. Stable scarring in the right upper lobe is noted. No acute bony abnormality is seen. IMPRESSION: New right middle lobe infiltrate. Stable hiatal hernia. No other acute abnormality is noted. Electronically Signed   By: Inez Catalina M.D.   On: 03/16/2018 12:04  Assessment & Plan:   Right lower lobe  pneumonia ([place]) - Some clinical improvement, hemoptysis has resolved - Insp wheeze on exam with mild dyspnea  - START taking 630m prednisone x 5 days; then 124mx 5 days; then resume 30m82maily  - Continue Cefpodoxiume until completed  - Use Incentive spirometer every hour - Monitor and notify office for fever >100.5 or return of hemoptysis - Follow up in 5-7 days with NP or Dr. ByrLamonte Sakai  Hemoptysis - Resolved, likely related to bronchiectasis and new RLL pneumonia - Ok to resume plavix tomorrow - Instructed to notify if symptoms return   Discussed above with Dr. ByrLamonte Sakai office   EliMartyn EhrichP 03/20/2018

## 2018-03-26 ENCOUNTER — Ambulatory Visit: Payer: Medicare Other | Admitting: Podiatry

## 2018-03-26 ENCOUNTER — Telehealth: Payer: Self-pay

## 2018-03-26 NOTE — Telephone Encounter (Signed)
Patient's daughter Sharyl Nimrod (respiratory therapist) called (DPR) stating she is aware RB wanted close f/u regarding her PNE but she feels the patient is clinically stable and she does not want to bring her in here around anyone sick. She states the patient does not have a fever, she is up walking, and has a great appetite. Appt has been cancelled per request. Please send message to RB as FYI.

## 2018-03-27 ENCOUNTER — Ambulatory Visit: Payer: Self-pay | Admitting: Primary Care

## 2018-03-27 NOTE — Telephone Encounter (Signed)
Spoke with pt daughter (DPR).  She said Sarah [name] is doing well.  She is at her family's house and is walking outside and eating.  Informed them to come in if needed but home is the best place.  Nothing further is needed.

## 2018-03-27 NOTE — Telephone Encounter (Signed)
Thanks

## 2018-04-02 ENCOUNTER — Telehealth: Payer: Self-pay | Admitting: Emergency Medicine

## 2018-04-02 NOTE — Telephone Encounter (Signed)
Called and spoke with pt's daughter Sarah [name] who stated pt stopped pred taper about 4 days ago and per Sarah [name], after pt stopped the pred taper, pt has not been on prednisone at all.  Sarah [name] believed after pt had stopped the pred taper, they did not put in there the 5mg  prednisone in the pill box that she was supposed to have continued taking after the taper. Sarah [name] did state that she started back on the 5mg  prednisone today.  Sarah [name] stated to me that pt is beginning to feel better since she has started back on her 5mg  maintenance prednisone she is supposed to be on.  Stated to [place] to call us if pt becomes worse and we could see how we could help. [name] expressed understanding. Nothing further needed.

## 2018-04-02 NOTE — Telephone Encounter (Signed)
I spoke with Sharyl Nimrod. Patient is staying at home right now because norovirus was at her facility. She hasn't had any diarrhea. Her prednisone taper ended 3 days ago - Sharyl Nimrod isn't sure whether pt is actually taking her prednisone 5 at this time.  She will work on confirming this.  The symptoms may reflect adrenal insufficiency.  If she is off the prednisone 5 mg and I will restart it.  If she is taking the prednisone 5 mg and still has the symptoms it may be that we taper too quickly.  I can get her back to 10 mg for 7 to 10 days and then try to bring her back down to 5 mg at that time.  No current symptoms consistent with a new acute respiratory process.  Sharyl Nimrod will call me back to update me we will decide how to proceed

## 2018-04-02 NOTE — Telephone Encounter (Signed)
Called and spoke with patients daughter, she stated that the patient has low energy. Patient was diagnosed with pneumonia. She was placed on a steroid taper and and has her antibiotics. Patient has been staying with her daughter instead of her retirement home. Patient started getting low energy over the weekend, fatigue and back to how she was before pneumonia started. Hard to walk without getting SHOB. Its hard for her to move. Patient also broke off and tooth and is currently at the dentist. Her vital signs are normal. Patients daughter wants to know what to do. Taking normal meds. Please advise, thank you.

## 2018-04-21 ENCOUNTER — Ambulatory Visit: Payer: Medicare Other | Admitting: Podiatry

## 2018-04-27 ENCOUNTER — Ambulatory Visit: Payer: Self-pay | Admitting: Emergency Medicine

## 2018-05-04 ENCOUNTER — Other Ambulatory Visit: Payer: Self-pay | Admitting: Cardiovascular Disease

## 2018-05-08 ENCOUNTER — Telehealth: Payer: Self-pay | Admitting: Cardiovascular Disease

## 2018-05-08 NOTE — Telephone Encounter (Signed)
Spoke with patient's daughter, Sharyl Nimrod, who confirmed all demographics. I texted her the link for My Chart which she will sign up with this afternoon.  Daughter has a Cytogeneticist. Will have vitals ready for visit. Received verbal consent for virtual visit.

## 2018-05-13 ENCOUNTER — Other Ambulatory Visit: Payer: Self-pay

## 2018-05-13 ENCOUNTER — Encounter: Payer: Self-pay | Admitting: Cardiovascular Disease

## 2018-05-13 ENCOUNTER — Telehealth (INDEPENDENT_AMBULATORY_CARE_PROVIDER_SITE_OTHER): Payer: Medicare Other | Admitting: Cardiovascular Disease

## 2018-05-13 VITALS — BP 114/81 | HR 81 | Ht 64.0 in | Wt 134.0 lb

## 2018-05-13 DIAGNOSIS — Z7189 Other specified counseling: Secondary | ICD-10-CM

## 2018-05-13 DIAGNOSIS — I10 Essential (primary) hypertension: Secondary | ICD-10-CM | POA: Diagnosis not present

## 2018-05-13 DIAGNOSIS — I214 Non-ST elevation (NSTEMI) myocardial infarction: Secondary | ICD-10-CM

## 2018-05-13 NOTE — Patient Instructions (Signed)
Medication Instructions:  Your physician has recommended you make the following change in your medication:  STOP Losartan (Cozaar)    HOW TO TAKE YOUR BLOOD PRESSURE:  Rest 5 minutes before taking your blood pressure.   Don't smoke or drink caffeinated beverages for at least 30 minutes before.  Take your blood pressure before (not after) you eat.  Sit comfortably with your back supported and both feet on the floor (don't cross your legs).  Elevate your arm to heart level on a table or a desk.  Use the proper sized cuff. It should fit smoothly and snugly around your bare upper arm. There should be enough room to slip a fingertip under the cuff. The bottom edge of the cuff should be 1 inch above the crease of the elbow.  If you need a refill on your cardiac medications before your next appointment, please call your pharmacy.    Lab work: None Ordered     Testing/Procedures: None Ordered    Follow-Up: At [facility], you and your health needs are our priority.  As part of our continuing mission to provide you with exceptional heart care, we have created designated Provider Care Teams.  These Care Teams include your primary Cardiologist (physician) and Advanced Practice Providers (APPs -  Physician Assistants and Nurse Practitioners) who all work together to provide you with the care you need, when you need it. You will need a follow up appointment in:  6 months.  Please call our office 2 months in advance to schedule this appointment.  You may see Kristeen Miss, MD  or one of the following Advanced Practice Providers on your designated Care Team: Tereso Newcomer, PA-C [place], [place] . Berton Bon, NP

## 2018-05-13 NOTE — Progress Notes (Signed)
Virtual Visit via Telephone Note   This visit type was conducted due to national recommendations for restrictions regarding the COVID-19 Pandemic (e.g. social distancing) in an effort to limit this patient's exposure and mitigate transmission in our community.  Due to her co-morbid illnesses, this patient is at least at moderate risk for complications without adequate follow up.  This format is felt to be most appropriate for this patient at this time.  The patient did not have access to video technology/had technical difficulties with video requiring transitioning to audio format only (telephone).  All issues noted in this document were discussed and addressed.  No physical exam could be performed with this format.  Please refer to the patient's chart for her  consent to telehealth for [facility].   Evaluation Performed:  Follow-up visit  Date:  05/13/2018   ID:  Sarah [name], DOB 1922/01/12, MRN [id number]  Patient Location: Home Provider Location: Home  PCP:  [name], MD  Cardiologist:  Mertie Moores, MD  Electrophysiologist:  None   Problem list: 1. Hypertension 2. Asthma 3. Depression 4. Coronary artery disease-status post non-ST segment elevation myocardial infarction. Cardiac cath revealed nonobstructive coronary artery disease 5. Bronchiectasis / atypical mycobacterium infection   Sarah [name] is seen back today following a recent hospitalization. She was admitted to the hospital with chest tightness. She ruled out for myocardial infarction. She has a history of chest pain in the past and a cardiac catheterization in 2012 was unremarkable.  She originally called EMS when her BP was 220/110  She has had significant difficulty controlling her BP recently.   She has been watching her salt.  She has taken amlodipine and Lisinopril in the past but these have been stopped because she seemed to be overmedicated.    She continues to have intermittent episodes of chest  pressure and heaviness.  These are typically associated with marked hypertension although it is not clear whether the hypertension occurs first or as a result of her  feeling poorly.  Jun 11, 2012:  Sarah [name] was seen by Truitt Merle in February, 2014. She had been measuring her blood pressure at least 10 times every day and was worried because her blood pressure seemed to be somewhat labile.  She feels poorly today.  She did not sleep well last night.  She had a headache.   She has lots of anxiety issues.  She takes xanax with some relief.    June 2 , 2015:  Sarah [name] is doing ok.  Upset about having to put her husband in an memory care SNF.  Lots of guilt about that.    Has a home sitter with her today. No significant CP or dyspnea.  She has some anxiety - has attacks on occasion.  Her anxiety seems to start in her chest as a CP.  If she takes the xanax and Tylenol #3 in time, she can avoid the panic attacks.    She thinks she had 3 TIAs this past year.  Carotid duplex was negative.  MRI was normal.    She has been on 1 ASA 325 mg a day.   Jan. 31, 2017:  Patient is ding well.   BP and HR look great today Still short of breath .  Has pulmonary issues.   Sees [name] .   Jan. 30, 2018:    Doing well from a cardiac .    Just getting over an episode of cellulitis and lymphedema .  No further CP   BP has been a little high.  She thinks she may have had a TIA on Saturday .    She felt strange in her head,  Could not speak,  Had double vision,  Took her BP - 199/110  May 27, 2017:  Sarah [name] is seen today for follow-up visit.  She has a history of coronary artery disease. She had a TIA earlier this year.  Echocardiogram in January, 2019 reveals a vigorous left ventricular systolic function with EF of 65 to 70%.  She has grade 1 diastolic dysfunction.  No CP . Has COPD  HR is a little fast but over all is doing well    Chief Complaint:  Dyspnea   May 13, 2018      Sarah [name] is a 85 y.o. female with history of coronary artery disease with a remote non-ST segment elevation myocardial infarction.  Cath at that time revealed nonobstructive coronary artery disease.  She has a history of bronchiectasis and atypical Mycobacterium.  She had an echocardiogram in January, 2019 which revealed vigorous left ventricular systolic function with an ejection fraction of 65 to 70%.  She has grade 1 diastolic dysfunction.  Phone visit . With daughter , meridith A bit more fatigued today  More shortness of breath  Has no strength. Had pneumonia in March, 2020 Has not recovered since then  Lives at [facility] but family took her out with the Norovirus outbreak at [employer].   Lots of discussion about her Losartan 50 mg tabs. Apparently , it was dropped off her list but family has been giving it to her   She notes lots of generalized weakness over the past several days.  It is possible that this is due to a lower than normal blood pressure.     The patient does not have symptoms concerning for COVID-19 infection (fever, chills, cough, or new shortness of breath).    Past Medical History:  Diagnosis Date   Allergic rhinitis    Anxiety    Anxiety attacks   Arthritis    "a little; in my back and hands" (04/15/2012)   Asthma    Bronchiectasis    with history of Legionaires with chronic rales/rhonchi   Chest pain at rest    Daily headache    "over the last week" 04/15/2012    Depression    Diastolic dysfunction    a. per echo 08/2010 with normal LV function;  b. 06/2011 Echo: EF 65-70%   GERD (gastroesophageal reflux disease)    "just recently" (04/15/2012)   H/O hiatal hernia    H/O Legionnaire's disease 11/1976   History of blood transfusion 1972   "w/hysterectomy" (04/15/2012)   Hypercholesteremia    Hypertension    Negative renal duplex in December of 2012   Hypothyroidism    Idiopathic peripheral neuropathy    MAC (mycobacterium  avium-intracellulare complex)    Migraines    "outgrew them" (04/15/2012)   NSTEMI (non-ST elevated myocardial infarction) ([place])    a. Normal coronaries per cath August 2012 and negative CT angio for PE;  b. 06/2011 Repeat admission w/ chest pain and elevated troponin's, CTA Chest w/o PE   Osteopenia 02/2011   t score - 2.1   Pneumonia    "recurrent" (04/15/2012)   Pollen allergies    Shortness of breath    "sometimes just lying down" (04/15/2012)   Stroke ([place]) 06/2014   cerebellum   Past Surgical History:  Procedure Laterality Date  APPENDECTOMY     CARDIAC CATHETERIZATION  August 2012   Normal coronaries.   DILATION AND CURETTAGE OF UTERUS     TONSILLECTOMY  1930   TOTAL ABDOMINAL HYSTERECTOMY W/ BILATERAL SALPINGOOPHORECTOMY  1972   leiomyomata, menorrhagia     Current Meds  Medication Sig   albuterol (PROVENTIL HFA;VENTOLIN HFA) 108 (90 Base) MCG/ACT inhaler Inhale 1-2 puffs into the lungs every 6 (six) hours as needed for wheezing or shortness of breath.   albuterol (PROVENTIL) (2.5 MG/3ML) 0.083% nebulizer solution Take 3 mLs (2.5 mg total) by nebulization 2 (two) times daily. Take 1 vial twice a day. DX: J47.9   ALPRAZolam (XANAX) 0.25 MG tablet Take 0.25 mg by mouth 2 (two) times daily.    ALPRAZolam (XANAX) 0.5 MG tablet Take 1 tablet by mouth every evening.   azithromycin (ZITHROMAX) 250 MG tablet TAKE 2 TABLET TODAY THEN TAKE 1 TABLET DAILY FOR 4 DAYS   calcium carbonate (TUMS - DOSED IN MG ELEMENTAL CALCIUM) 500 MG chewable tablet Chew 1 tablet by mouth every 8 (eight) hours as needed for indigestion or heartburn.   carvedilol (COREG) 6.25 MG tablet TAKE 1 TABLET(6.25 MG) BY MOUTH TWICE DAILY   cefdinir (OMNICEF) 300 MG capsule Take 1 capsule (300 mg total) by mouth 2 (two) times daily.   clopidogrel (PLAVIX) 75 MG tablet Take 1 tablet (75 mg total) by mouth daily.   DULoxetine (CYMBALTA) 20 MG capsule Take 20 mg by mouth 3 (three) times daily.    escitalopram (LEXAPRO) 5 MG tablet Take 2.5 mg by mouth daily.   gabapentin (NEURONTIN) 100 MG capsule Take 300 mg by mouth 3 (three) times daily.    hydrochlorothiazide (MICROZIDE) 12.5 MG capsule Take 1 capsule (12.5 mg total) by mouth daily.   latanoprost (XALATAN) 0.005 % ophthalmic solution Place 1 drop into both eyes at bedtime.    levothyroxine (SYNTHROID, LEVOTHROID) 50 MCG tablet Take 50 mcg by mouth daily.     loratadine (CLARITIN) 10 MG tablet Take 10 mg by mouth daily.   MULTIPLE VITAMIN PO Take 1 tablet by mouth daily.   pantoprazole (PROTONIX) 40 MG tablet Take 40 mg by mouth 2 (two) times daily.   predniSONE (DELTASONE) 5 MG tablet Take 5 mg by mouth daily with breakfast.   Probiotic Product (PROBIOTIC ADVANCED PO) Take 1 tablet by mouth daily.   Sennosides (SENOKOT PO) Take 1 tablet by mouth as needed.    VITAMIN D, CHOLECALCIFEROL, PO Take 2 tablets by mouth daily.      Allergies:   Codeine; Hydrocodone; Isosorbide nitrate; Oxycodone; Clarithromycin; Fiorinal [butalbital-aspirin-caffeine]; Levofloxacin; Augmentin [amoxicillin-pot clavulanate]; and Doxycycline   Social History   Tobacco Use   Smoking status: Never Smoker   Smokeless tobacco: Never Used  Substance Use Topics   Alcohol use: Yes    Alcohol/week: 2.0 standard drinks    Types: 2 Glasses of wine per week    Comment: wine 1-2 week   Drug use: No     Family Hx: The patient's family history includes Cancer in her brother; Hypertension in her mother; Osteoarthritis in her paternal aunt; Stroke in her mother.  ROS:   Please see the history of present illness.     All other systems reviewed and are negative.   Prior CV studies:   The following studies were reviewed today:    Labs/Other Tests and Data Reviewed:    EKG:  No ECG reviewed.  Recent Labs: 03/16/2018: BUN 14; Creatinine, Ser 0.87; Hemoglobin 10.5; Platelets 336.0;  Potassium 4.1; Sodium 133   Recent Lipid Panel Lab Results    Component Value Date/Time   CHOL 179 07/09/2014 05:51 AM   TRIG 66 07/09/2014 05:51 AM   HDL 67 07/09/2014 05:51 AM   CHOLHDL 2.7 07/09/2014 05:51 AM   LDLCALC 99 07/09/2014 05:51 AM    Wt Readings from Last 3 Encounters:  05/13/18 134 lb (60.8 kg)  03/20/18 137 lb 6.4 oz (62.3 kg)  03/16/18 136 lb 12.8 oz (62.1 kg)     Objective:    Vital Signs:  BP 114/81 (BP Location: Left Arm, Patient Position: Sitting, Cuff Size: Normal)    Pulse 81    Ht _0  (1.651 m)    Wt 134 lb (60.8 kg)    LMP 01/14/1970    SpO2 (!) 89%    BMI 22.30 kg/m    No exam due to telephone visit  .   ASSESSMENT & PLAN:    1. HTN:  I think she is actually over medicated at this time.   Will hold her Losartan for the next several weeks.   Daughter will call with BP readings in several weeks.  My hope is that this will result in Mrs. well having a slightly higher energy level.  2.  Coronary artery disease: The patient has a history of non-ST segment elevation myocardial infarction.  She had only minimal coronary artery disease by heart cath.  Continue Plavix.  Continue current medications.  COVID-19 Education: The signs and symptoms of COVID-19 were discussed with the patient and how to seek care for testing (follow up with PCP or arrange E-visit).  The importance of social distancing was discussed today.  Time:   Today, I have spent 16 minutes with the patient with telehealth technology discussing the above problems.     Medication Adjustments/Labs and Tests Ordered: Current medicines are reviewed at length with the patient today.  Concerns regarding medicines are outlined above.   Tests Ordered: No orders of the defined types were placed in this encounter.   Medication Changes: No orders of the defined types were placed in this encounter.   Disposition:  Follow up in 6 month(s)  Signed, Mertie Moores, MD  05/13/2018 11:35 AM     [facility]

## 2018-05-29 ENCOUNTER — Telehealth: Payer: Self-pay | Admitting: Cardiovascular Disease

## 2018-05-29 NOTE — Telephone Encounter (Signed)
New message  Patient's daughter Sharyl Nimrod would like to get a return call in reference to two weeks of blood pressure readings for patient.. She stated that it would be easier to give those directly to you by phone. Please call.

## 2018-05-29 NOTE — Telephone Encounter (Signed)
Called patient's daughter back (DPR). She would like to report some of the patient's BP's, 124/87, 134/92, 128/107, 150/97 and HR 90's. Patient reported feeling much better after stopping Losartan. Will forward to Dr. Elease Hashimoto and his nurse.

## 2018-06-01 NOTE — Telephone Encounter (Signed)
BP is a little elevated but she is feeling better

## 2018-06-02 NOTE — Telephone Encounter (Signed)
Spoke with patient's daughter, Sharyl Nimrod, and advised that appointment on May 26 with Dr. Elease Hashimoto is not needed since patient had a virtual visit on 4/29. We discussed BP readings and that Dr. Elease Hashimoto would prefer patient's BP be a little elevated than for her to be unsteady when she is walking around. Sharyl Nimrod states that the patient feels better and has a more steady gait since stopping losartan. I advised her to continue to monitor and call back with questions or concerns. She thanked me for the call.

## 2018-06-08 ENCOUNTER — Other Ambulatory Visit: Payer: Self-pay | Admitting: Emergency Medicine

## 2018-06-09 ENCOUNTER — Ambulatory Visit: Payer: Self-pay | Admitting: Cardiovascular Disease

## 2018-06-15 ENCOUNTER — Encounter: Payer: Self-pay | Admitting: Podiatry

## 2018-06-15 ENCOUNTER — Other Ambulatory Visit: Payer: Self-pay

## 2018-06-15 ENCOUNTER — Ambulatory Visit (INDEPENDENT_AMBULATORY_CARE_PROVIDER_SITE_OTHER): Payer: Medicare Other | Admitting: Podiatry

## 2018-06-15 VITALS — Temp 97.5°F

## 2018-06-15 DIAGNOSIS — L84 Corns and callosities: Secondary | ICD-10-CM | POA: Diagnosis not present

## 2018-06-15 DIAGNOSIS — M79674 Pain in right toe(s): Secondary | ICD-10-CM | POA: Diagnosis not present

## 2018-06-15 DIAGNOSIS — B351 Tinea unguium: Secondary | ICD-10-CM

## 2018-06-15 DIAGNOSIS — Z9229 Personal history of other drug therapy: Secondary | ICD-10-CM | POA: Diagnosis not present

## 2018-06-15 DIAGNOSIS — M79675 Pain in left toe(s): Secondary | ICD-10-CM

## 2018-06-15 MED ORDER — MUPIROCIN 2 % EX OINT: TOPICAL_OINTMENT | CUTANEOUS | 0 refills | Status: DC

## 2018-06-15 NOTE — Patient Instructions (Signed)

## 2018-06-20 NOTE — Progress Notes (Signed)
Subjective: Sarah [name] is a 85 y.o. y.o. female who is on long term blood thinner Plavix and presents today with painful mycotic toenails and multiple corns bilaterally.  This interferes with daily activities. Pain is aggravated when wearing enclosed shoe gear. Pain is relieved with periodic professional debridement.   [name], MD is her PCP.   Allergies  Allergen Reactions  . Codeine Anaphylaxis  . Hydrocodone Nausea And Vomiting and Other (See Comments)    SYNCOPE AND BRADYCARDIA  . Isosorbide Nitrate Other (See Comments)    BRADYCARDIA  . Oxycodone Palpitations    Rapid heart beat  . Clarithromycin Nausea And Vomiting    Has patient had a PCN reaction causing immediate rash, facial/tongue/throat swelling, SOB or lightheadedness with hypotension: Unknown Has patient had a PCN reaction causing severe rash involving mucus membranes or skin necrosis: Unknown Has patient had a PCN reaction that required hospitalization: Unknown Has patient had a PCN reaction occurring within the last 10 years: Unknown If all of the above answers are "NO", then may proceed with Cephalosporin use.   . Fiorinal [Butalbital-Aspirin-Caffeine] Swelling and Other (See Comments)    Facial swelling   . Levofloxacin Nausea And Vomiting and Other (See Comments)    SYNCOPE ALSO  . Augmentin [Amoxicillin-Pot Clavulanate] Diarrhea  . Doxycycline     Sweats and aches     Objective: 85 year old Caucasian female well-developed, well-nourished in no acute distress.  AAO x3. Vitals:   06/15/18 1611  Temp: (!) 97.5 F (36.4 C)    Vascular Examination: Capillary refill time less than 3 seconds x 10 digits.  Dorsalis pedis pulses palpable bilaterally.  Posterior tibial pulses palpable bilaterally.  No digital hair x 10 digits.  Skin temperature gradient within normal limits bilaterally.  Dermatological Examination: Skin noted to be atrophic bilaterally.  Toenails 1-5 b/l discolored, thick,  dystrophic with subungual debris and pain with palpation to nailbeds due to thickness of nails.   Hyperkeratotic lesions noted dorsal PIPJ right fifth toe, lateral fourth toe, dorsal PIPJ left fourth toe, distal tip right fourth toe.  There is noted subdermal hemorrhage of the lesion on the right fifth toe and the distal tip of the right fourth toe.  There is tenderness to palpation of the right fourth and fifth digits.  There is no erythema, no edema, no drainage, no flocculence noted.  Musculoskeletal: Muscle strength 5/5 to all LE muscle groups.  Hammertoes deformity digits 2 through 5 bilaterally.  Neurological: Sensation intact with 10 gram monofilament.  Vibratory sensation intact.  Assessment: Painful onychomycosis toenails 1-5 b/l in patient on blood thinner.  Multiple corns right fifth digit, bilateral fourth digits Pre-ulcerative corn right fifth digit, distal tip right fourth digit  Plan: 1. Toenails 1-5 b/l were debrided in length and girth without iatrogenic bleeding. 2. Hyperkeratotic lesion(s) debrided dorsal PIPJ right fifth toe, lateral fourth toe, dorsal PIPJ left fourth toe, distal tip right fourth toe. utilizing sterile scalpel blade without incident.  Dispensed silicone toe caps for digital deformities.  She is to wear them daily for protection. 3. Prescription written for mupirocin ointment to be applied to pre-ulcerative lesions right fifth digit and right fourth digit once daily. 4. Patient to continue soft, supportive shoe gear daily. 5. Patient to report any pedal injuries to medical professional immediately. 6. Avoid self trimming due to use of blood thinner. 7. Follow up 3 months.   8. Patient/POA to call should there be a concern in the interim.

## 2018-06-22 ENCOUNTER — Other Ambulatory Visit: Payer: Self-pay | Admitting: Cardiovascular Disease

## 2018-07-06 ENCOUNTER — Ambulatory Visit (INDEPENDENT_AMBULATORY_CARE_PROVIDER_SITE_OTHER): Payer: Medicare Other | Admitting: Emergency Medicine

## 2018-07-06 ENCOUNTER — Other Ambulatory Visit: Payer: Self-pay

## 2018-07-06 ENCOUNTER — Ambulatory Visit (INDEPENDENT_AMBULATORY_CARE_PROVIDER_SITE_OTHER): Payer: Medicare Other

## 2018-07-06 ENCOUNTER — Encounter: Payer: Self-pay | Admitting: Emergency Medicine

## 2018-07-06 VITALS — BP 96/62 | HR 91 | Temp 97.5°F | Wt 131.0 lb

## 2018-07-06 DIAGNOSIS — R05 Cough: Secondary | ICD-10-CM

## 2018-07-06 DIAGNOSIS — I10 Essential (primary) hypertension: Secondary | ICD-10-CM | POA: Diagnosis not present

## 2018-07-06 DIAGNOSIS — J479 Bronchiectasis, uncomplicated: Secondary | ICD-10-CM | POA: Diagnosis not present

## 2018-07-06 DIAGNOSIS — R059 Cough, unspecified: Secondary | ICD-10-CM

## 2018-07-06 NOTE — Progress Notes (Signed)
   Subjective:    Patient ID: Sarah [name], female    DOB: 1921/04/28, 85 y.o.   MRN: [id number]  HPI  ROV 01/22/18 --pleasant 85 year old woman with bronchiectasis, chronic asthmatic bronchitis, chronic multifactorial cough.  She has been using prednisone 10 mg daily, chest vibration vest - less frequently than before.  Rotating antibiotics now azithromycin and cefdinir.  She has to clear secretions about 2x a day - time consuming, takes about 1 hour.   ROV 07/06/2018 --Mrs. Sarah [name] is 89, has a history of bronchiectasis and chronic asthmatic bronchitis, cough.  She has been on chronic prednisone 5mg , chest vibration vest and rotating antibiotics.  She had a flare in March that was treated as a lobar pneumonia that was associated with hemoptysis. She is back on rotating abx: azithro, cefdinir. She has had relative hypotension, had her BP regimen adjusted by Cardiology end of March (stopped losartan). She has been having falls at home. She feels more weak since PNA in March. She has a lot of cough, clears mucous frequently, not always associated with chest vest which she does daily.    Review of Systems As above       Objective:   Physical Exam  Vitals:   07/06/18 1355  BP: 96/62  Pulse: 91  Temp: (!) 97.5 F (36.4 C)  TempSrc: Oral  SpO2: 91%  Weight: 131 lb (59.4 kg)    GEN: A/Ox3, a bit anxious, NAD, elderly and thin. Appears weaker   HEENT:  OP clear  NECK:  No stridor  RESP distant, clear bilaterally, few scattered insp squeaks  CARD:  Regular, no M  Musco: Warm bil, no deformities or joint swelling noted.   Neuro: alert, no focal deficits noted.    Skin: Warm, no lesions or rashes, ecchymoses present LE's      Assessment & Plan:  BRONCHIECTASIS She is still drained, having weakness and some falls. She may not get back to baseline post-PNA. ? Contribution hypotension on ger BP regimen - she is low today. I believe that cough burden is high. Need to enhance her  secretion clearance if at all possible.   CXR today We will continue to rotate your antibiotics as you have been doing (azithromycin, cefdinir) Try to increase the frequency of your albuterol nebs to twice a day to see if this helps you clear mucous more reliably Try using your chest vest 1-2 times a day to help with mucous clearance.  We will increase your prednisone to 15mg  daily for 1 week, then to 10mg  daily thereafter until next visit.  Continue your loratadine daily Follow with Dr Lamonte Sakai in 1 month or next available.   HTN (hypertension) Agree with following with Dr Acie Fredrickson to see if you need any adjustment in your blood pressure medications.   Baltazar Apo, MD, PhD 07/06/2018, 2:24 PM  Pulmonary and Critical Care [phone] or if no answer [phone]

## 2018-07-06 NOTE — Assessment & Plan Note (Signed)
Agree with following with Dr Acie Fredrickson to see if you need any adjustment in your blood pressure medications.

## 2018-07-06 NOTE — Patient Instructions (Addendum)
We will continue to rotate your antibiotics as you have been doing (azithromycin, cefdinir) Try to increase the frequency of your albuterol nebs to twice a day to see if this helps you clear mucous more reliably Try using your chest vest 1-2 times a day to help with mucous clearance.  We will increase your prednisone to 15mg  daily for 1 week, then to 10mg  daily thereafter until next visit.  Continue your loratadine daily Agree with following with Dr Acie Fredrickson to see if you need any adjustment in your blood pressure medications.  Follow with Dr Lamonte Sakai in 1 month or next available.

## 2018-07-06 NOTE — Assessment & Plan Note (Signed)
She is still drained, having weakness and some falls. She may not get back to baseline post-PNA. ? Contribution hypotension on ger BP regimen - she is low today. I believe that cough burden is high. Need to enhance her secretion clearance if at all possible.   CXR today We will continue to rotate your antibiotics as you have been doing (azithromycin, cefdinir) Try to increase the frequency of your albuterol nebs to twice a day to see if this helps you clear mucous more reliably Try using your chest vest 1-2 times a day to help with mucous clearance.  We will increase your prednisone to 15mg  daily for 1 week, then to 10mg  daily thereafter until next visit.  Continue your loratadine daily Follow with Dr Lamonte Sakai in 1 month or next available.

## 2018-07-07 ENCOUNTER — Telehealth: Payer: Self-pay | Admitting: Licensed Clinical Social Worker

## 2018-07-07 NOTE — Telephone Encounter (Signed)
Palliative Care SW spoke with patient's daughter, [name], to schedule a visit.  She will discuss with her sister, [name], to schedule a Zoom meeting and will get back with SW.

## 2018-07-17 ENCOUNTER — Other Ambulatory Visit: Payer: Self-pay

## 2018-07-17 ENCOUNTER — Other Ambulatory Visit: Payer: Medicare Other | Admitting: Licensed Clinical Social Worker

## 2018-07-17 DIAGNOSIS — Z515 Encounter for palliative care: Secondary | ICD-10-CM

## 2018-07-17 NOTE — Progress Notes (Signed)
COMMUNITY PALLIATIVE CARE SW NOTE  PATIENT NAME: Sarah [name] DOB: 1921-12-06 MRN: [id number]  PRIMARY CARE PROVIDER: [name], MD  RESPONSIBLE PARTY:  Acct ID - Guarantor Home Phone Work Phone Relationship Acct Type  [id number] Chales [phone]  Self P/F     [address], [place], [place] [address]   Due to the COVID-19 crisis, this virtual check-in visit was done via telephone from my office and it was initiated and consent given by thispatientand or family.   PLAN OF CARE and INTERVENTIONS:             1. GOALS OF CARE/ ADVANCE CARE PLANNING:  Goal is for patient not to be hospitalized.  Prior hospitalization indicates patient was a DNR. 2. SOCIAL/EMOTIONAL/SPIRITUAL ASSESSMENT/ INTERVENTIONS:  SW conducted a Sales executive visit with patient's daughter [name].  She said she did not contact SW to schedule a visit with patient because of the medical issues that have come up.  [name] said patient was previously falling a lot and had no energy.  The MD changed some of her medications, and patient is now back to baseline.  SW provided active listening and supportive counseling. 3. PATIENT/CAREGIVER EDUCATION/ COPING:  [name] copes by expressing her feelings openly.  SW provided education regarding Palliative Care and she stated she understood. 4. PERSONAL EMERGENCY PLAN:  Per facility protocol. 5. COMMUNITY RESOURCES COORDINATION/ HEALTH CARE NAVIGATION:  None. 6. FINANCIAL/LEGAL CONCERNS/INTERVENTIONS:  None.     SOCIAL HX:  Social History   Tobacco Use  . Smoking status: Never Smoker  . Smokeless tobacco: Never Used  Substance Use Topics  . Alcohol use: Yes    Alcohol/week: 2.0 standard drinks    Types: 2 Glasses of wine per week    Comment: wine 1-2 week    CODE STATUS:  DNR (Prior hospitalization)  ADVANCED DIRECTIVES: N MOST FORM COMPLETE:  N HOSPICE EDUCATION PROVIDED: Patient and family are familiar with Hospice because patient's husband was  under Hospice care. PPS:  Appetite is normal.  Patient is able to ambulate. Duration of visit and documentation:  45 minutes.      Creola Corn Nada Godley, LCSW

## 2018-07-28 ENCOUNTER — Telehealth: Payer: Self-pay | Admitting: Physician Assistant

## 2018-07-28 NOTE — Telephone Encounter (Signed)
Paged by the patient's daughter regarding elevated blood pressure in the past 2 days.  He systolic blood pressure in the 180s to 190s.  Her heart rate was in the 80s.  I advised the patient's daughter to give her additional half a tablet of carvedilol if her systolic blood pressure is over 180.  Continue to document her blood pressure for the next 2 weeks and for the blood pressure diary to Dr. Acie Fredrickson for review.

## 2018-07-29 NOTE — Telephone Encounter (Signed)
Agree with plan by Almyra Deforest, :PA

## 2018-08-03 ENCOUNTER — Other Ambulatory Visit: Payer: Self-pay | Admitting: Emergency Medicine

## 2018-08-08 ENCOUNTER — Ambulatory Visit
Admission: EM | Admit: 2018-08-08 | Discharge: 2018-08-08 | Disposition: A | Discharge status: Home / Self Care | Payer: Medicare Other | Attending: Physician Assistant | Admitting: Physician Assistant

## 2018-08-08 ENCOUNTER — Other Ambulatory Visit (HOSPITAL_COMMUNITY): Payer: Self-pay | Admitting: Emergency Medicine

## 2018-08-08 ENCOUNTER — Other Ambulatory Visit: Payer: Self-pay

## 2018-08-08 ENCOUNTER — Ambulatory Visit (HOSPITAL_BASED_OUTPATIENT_CLINIC_OR_DEPARTMENT_OTHER): Admit: 2018-08-08 | Discharge: 2018-08-08 | Disposition: A | Discharge status: Home / Self Care | Payer: Medicare Other

## 2018-08-08 DIAGNOSIS — R52 Pain, unspecified: Secondary | ICD-10-CM

## 2018-08-08 DIAGNOSIS — M7989 Other specified soft tissue disorders: Secondary | ICD-10-CM | POA: Insufficient documentation

## 2018-08-08 DIAGNOSIS — M79662 Pain in left lower leg: Secondary | ICD-10-CM

## 2018-08-08 DIAGNOSIS — R609 Edema, unspecified: Secondary | ICD-10-CM

## 2018-08-08 MED ORDER — CEPHALEXIN 500 MG PO CAPS: 500.0000 mg | ORAL_CAPSULE | Freq: Four times a day (QID) | ORAL | 0 refills | Status: DC

## 2018-08-08 NOTE — Progress Notes (Addendum)
VASCULAR LAB PRELIMINARY  PRELIMINARY  PRELIMINARY  PRELIMINARY  Left lower extremity venous duplex completed.    Preliminary report:  See CV proc for preliminary results.   Called Amy-PA with results.   Daryl Beehler, RVT 08/08/2018, 4:46 PM

## 2018-08-08 NOTE — ED Triage Notes (Signed)
Per pt family member she has been having swelling and pain in her legs that are oozing. Pt has not been able to walk very well. Says weakness and painfulness in her leg.

## 2018-08-08 NOTE — ED Provider Notes (Signed)
[facility]    CSN: [id number] Arrival date & time: 08/08/18  1430     History   Chief Complaint Chief Complaint  Patient presents with   Recurrent Skin Infections    HPI Sarah [name] is a 85 y.o. female.   85 year old female comes in with daughter for left leg swelling, redness, warmth, pain. Daughter states patient has been doing very well in terms of walking in the past 3 weeks. She often has bruises to the lower extremity from bumping into furniture while ambulating given on Plavix. Daughter noticed more discoloration in the past 24 hours with swelling, and with patient complaining of pain. Denies chest pain, shortness of breath. Patient alternates between azithromycin/cefdinir each month for bronchiectasis and daughter just picked up azithromycin. Otherwise no changes in medication. Denies confusion/altered mental status, dizziness, weakness. Patient states due to left leg pain, is causing trouble ambulating.      Past Medical History:  Diagnosis Date   Allergic rhinitis    Anxiety    Anxiety attacks   Arthritis    "a little; in my back and hands" (04/15/2012)   Asthma    Bronchiectasis    with history of Legionaires with chronic rales/rhonchi   Chest pain at rest    Daily headache    "over the last week" 04/15/2012    Depression    Diastolic dysfunction    a. per echo 08/2010 with normal LV function;  b. 06/2011 Echo: EF 65-70%   GERD (gastroesophageal reflux disease)    "just recently" (04/15/2012)   H/O hiatal hernia    H/O Legionnaire's disease 11/1976   History of blood transfusion 1972   "w/hysterectomy" (04/15/2012)   Hypercholesteremia    Hypertension    Negative renal duplex in December of 2012   Hypothyroidism    Idiopathic peripheral neuropathy    MAC (mycobacterium avium-intracellulare complex)    Migraines    "outgrew them" (04/15/2012)   NSTEMI (non-ST elevated myocardial infarction) ([place])    a. Normal coronaries per  cath August 2012 and negative CT angio for PE;  b. 06/2011 Repeat admission w/ chest pain and elevated troponin's, CTA Chest w/o PE   Osteopenia 02/2011   t score - 2.1   Pneumonia    "recurrent" (04/15/2012)   Pollen allergies    Shortness of breath    "sometimes just lying down" (04/15/2012)   Stroke ([place]) 06/2014   cerebellum    Patient Active Problem List   Diagnosis Date Noted   Right lower lobe pneumonia ([place]) 03/16/2018   Hemoptysis 03/16/2018   GERD (gastroesophageal reflux disease) 03/19/2017   Nausea vomiting and diarrhea    Gastroenteritis due to norovirus 01/25/2017   Adrenal insufficiency ([place]) 03/21/2015   HTN (hypertension) 02/14/2015   Fecal impaction ([place]) 10/07/2014   Colitis 10/06/2014   Cerebral infarction due to thrombosis of basilar artery ([place]) 09/09/2014   Aneurysm, cerebral, nonruptured 09/09/2014   HLD (hyperlipidemia) 09/09/2014   Acute ischemic stroke ([place]) 07/09/2014   Chronic diastolic CHF (congestive heart failure) ([place]) 07/09/2014   Stroke ([place]) 07/08/2014   TIA (transient ischemic attack) 07/08/2014   Chest pain 04/15/2012   Shortness of breath dyspnea 11/23/2011   Anxiety 11/23/2011   Malignant hypertension 11/23/2011   Elevated troponin 06/20/2011   Depression    Hypothyroidism    Arthritis    Racing heart beat 02/11/2011   NSTEMI (non-ST elevated myocardial infarction) ([place]) 09/24/2010   Idiopathic peripheral neuropathy    Pollen allergies  BRONCHIECTASIS 07/29/2008   Essential hypertension 06/15/2008   Allergic rhinitis 06/15/2008   Asthma 06/15/2008   Cough 06/15/2008    Past Surgical History:  Procedure Laterality Date   APPENDECTOMY     CARDIAC CATHETERIZATION  August 2012   Normal coronaries.   DILATION AND CURETTAGE OF UTERUS     TONSILLECTOMY  1930   TOTAL ABDOMINAL HYSTERECTOMY W/ BILATERAL SALPINGOOPHORECTOMY  1972   leiomyomata, menorrhagia    OB History    Gravida  2    Para  2   Term      Preterm      AB      Living  2     SAB      TAB      Ectopic      Multiple      Live Births               Home Medications    Prior to Admission medications   Medication Sig Start Date End Date Taking? Authorizing Provider  albuterol (PROVENTIL HFA;VENTOLIN HFA) 108 (90 Base) MCG/ACT inhaler Inhale 1-2 puffs into the lungs every 6 (six) hours as needed for wheezing or shortness of breath. 05/08/17   Chesley Mires, MD  albuterol (PROVENTIL) (2.5 MG/3ML) 0.083% nebulizer solution USE 1 VIAL VIA NEBULIZER TWICE DAILY 08/04/18   Byrum, Rose Fillers, MD  ALPRAZolam Duanne Moron) 0.5 MG tablet Take 1 tablet by mouth 3 (three) times daily. 04/06/18   [name], MD  azithromycin (ZITHROMAX) 250 MG tablet TAKE 2 TABLETS BY MOUTH TODAY THEN TAKE 1 TABLET DAILY FOR 4 DAYS 06/09/18   Collene Gobble, MD  calcium carbonate (TUMS - DOSED IN MG ELEMENTAL CALCIUM) 500 MG chewable tablet Chew 1 tablet by mouth every 8 (eight) hours as needed for indigestion or heartburn.    [provider]  carvedilol (COREG) 6.25 MG tablet TAKE 1 TABLET(6.25 MG) BY MOUTH TWICE DAILY 06/24/18   Nahser, Wonda Cheng, MD  cefdinir (OMNICEF) 300 MG capsule Take 1 capsule (300 mg total) by mouth 2 (two) times daily. 11/19/17   Collene Gobble, MD  cephALEXin (KEFLEX) 500 MG capsule Take 1 capsule (500 mg total) by mouth 4 (four) times daily. 08/08/18   Tasia Catchings, Zak Gondek V, PA-C  clopidogrel (PLAVIX) 75 MG tablet Take 1 tablet (75 mg total) by mouth daily. 12/09/17   Nahser, Wonda Cheng, MD  DULoxetine (CYMBALTA) 20 MG capsule Take 20 mg by mouth 3 (three) times daily. 06/14/14   [provider]  escitalopram (LEXAPRO) 5 MG tablet Take 2.5 mg by mouth daily.    [provider]  gabapentin (NEURONTIN) 100 MG capsule Take 300 mg by mouth 3 (three) times daily.     [provider]  latanoprost (XALATAN) 0.005 % ophthalmic solution Place 1 drop into both eyes at bedtime.  08/30/14    [provider]  levothyroxine (SYNTHROID, LEVOTHROID) 50 MCG tablet Take 50 mcg by mouth daily.      [provider]  loratadine (CLARITIN) 10 MG tablet Take 10 mg by mouth daily.    [provider]  MULTIPLE VITAMIN PO Take 1 tablet by mouth daily.    [provider]  mupirocin ointment (BACTROBAN) 2 % Apply to toes daily 06/15/18   Marzetta Board, DPM  pantoprazole (PROTONIX) 40 MG tablet Take 40 mg by mouth 2 (two) times daily.    [provider]  predniSONE (DELTASONE) 5 MG tablet Take 5 mg by mouth daily with  breakfast.    [provider]  Probiotic Product (PROBIOTIC ADVANCED PO) Take 1 tablet by mouth daily.    [provider]  Sennosides (SENOKOT PO) Take 1 tablet by mouth as needed.     [provider]  VITAMIN D, CHOLECALCIFEROL, PO Take 2 tablets by mouth daily.     [provider]    Family History Family History  Problem Relation Age of Onset   Hypertension Mother        stroke   Stroke Mother    Cancer Brother        prostate   Osteoarthritis Paternal Aunt     Social History Social History   Tobacco Use   Smoking status: Never Smoker   Smokeless tobacco: Never Used  Substance Use Topics   Alcohol use: Yes    Alcohol/week: 2.0 standard drinks    Types: 2 Glasses of wine per week    Comment: wine 1-2 week   Drug use: No     Allergies   Codeine, Hydrocodone, Isosorbide nitrate, Oxycodone, Clarithromycin, Fiorinal [butalbital-aspirin-caffeine], Levofloxacin, Augmentin [amoxicillin-pot clavulanate], and Doxycycline   Review of Systems Review of Systems  Reason unable to perform ROS: See HPI as above.     Physical Exam Triage Vital Signs ED Triage Vitals  Enc Vitals Group     BP 08/08/18 1439 (!) 146/85     Pulse Rate 08/08/18 1439 88     Resp 08/08/18 1439 16     Temp --      Temp Source 08/08/18 1439 Oral     SpO2 08/08/18 1439 92 %     Weight --       Height --      Head Circumference --      Peak Flow --      Pain Score 08/08/18 1444 4     Pain Loc --      Pain Edu? --      Excl. in [place]? --    No data found.  Updated Vital Signs BP (!) 146/85 (BP Location: Right Arm)    Pulse 88    Resp 16    LMP 01/14/1970    SpO2 92% Comment: COPD  Physical Exam Constitutional:      General: She is not in acute distress.    Appearance: She is well-developed. She is not diaphoretic.     Comments: Sitting comfortably on exam table.   HENT:     Head: Normocephalic and atraumatic.  Eyes:     Conjunctiva/sclera: Conjunctivae normal.     Pupils: Pupils are equal, round, and reactive to light.  Pulmonary:     Effort: Pulmonary effort is normal. No respiratory distress.  Musculoskeletal:     Comments: Left lower extremity with 2+ pitting edema from foot to mid shin. Erythema with warmth. Diffuse tenderness to palpation, but specifically to the calf. Pedal pulse 2+  Skin:    General: Skin is warm and dry.  Neurological:     Mental Status: She is alert and oriented to person, place, and time.      UC Treatments / Results  Labs (all labs ordered are listed, but only abnormal results are displayed) Labs Reviewed - No data to display  EKG   Radiology Le Venous  Result Date: 08/08/2018  Lower Venous Study Indications: Swelling, Pain, and Erythema.  Comparison Study: No prior study on file for comparison Performing Technologist: Sharion Dove RVS  Examination Guidelines: A complete evaluation includes B-mode imaging, spectral  Doppler, color Doppler, and power Doppler as needed of all accessible portions of each vessel. Bilateral testing is considered an integral part of a complete examination. Limited examinations for reoccurring indications may be performed as noted.  +-----+---------------+---------+-----------+----------+-------+  RIGHT Compressibility Phasicity Spontaneity Properties Summary   +-----+---------------+---------+-----------+----------+-------+  CFV   Full            Yes       Yes                             +-----+---------------+---------+-----------+----------+-------+   +---------+---------------+---------+-----------+----------+-------+  LEFT      Compressibility Phasicity Spontaneity Properties Summary  +---------+---------------+---------+-----------+----------+-------+  CFV       Full            Yes       Yes                             +---------+---------------+---------+-----------+----------+-------+  SFJ       Full                                                      +---------+---------------+---------+-----------+----------+-------+  FV Prox   Full                                                      +---------+---------------+---------+-----------+----------+-------+  FV Mid    Full                                                      +---------+---------------+---------+-----------+----------+-------+  FV Distal Full                                                      +---------+---------------+---------+-----------+----------+-------+  PFV       Full                                                      +---------+---------------+---------+-----------+----------+-------+  POP       Full            Yes       Yes                             +---------+---------------+---------+-----------+----------+-------+  PTV       Full                                                      +---------+---------------+---------+-----------+----------+-------+  PERO      Full                                                      +---------+---------------+---------+-----------+----------+-------+     Summary: Right: No evidence of common femoral vein obstruction. Left: There is no evidence of deep vein thrombosis in the lower extremity. Ultrasound characteristics of enlarged lymph nodes noted in the groin. Interstitial fluid in calf.  *See table(s) above for measurements and observations.     Preliminary     Procedures Procedures (including critical care time)  Medications Ordered in UC Medications - No data to display  Initial Impression / Assessment and Plan / UC Course  I have reviewed the triage vital signs and the nursing notes.  Pertinent labs & imaging results that were available during my care of the patient were reviewed by me and considered in my medical decision making (see chart for details).    Given history and exam, worries for DVT vs cellulitis. Patient discharged to [facility] for outpatient DVT study, will discussed plan after results. Daughter expresses understanding and agrees to plan.  DVT study negative for blood clot, does show tissue swelling and lymphadenopathy. Will start keflex for cellulitis. Patient with diarrhea taking augmentin, she has tolerated many cephalosporins without difficulty. Discussed leg elevation, ace wrap for compression. Patient has PCP appointment in 2 days, to follow up as scheduled for recheck. Return precautions given. Daughter expresses understanding and agrees to plan.  Final Clinical Impressions(s) / UC Diagnoses   Final diagnoses:  Pain and swelling of left lower leg   Discharge Instructions   None    ED Prescriptions    Medication Sig Dispense Auth. Provider   cephALEXin (KEFLEX) 500 MG capsule Take 1 capsule (500 mg total) by mouth 4 (four) times daily. 28 capsule Tobin Chad, [place] 08/08/18 1651

## 2018-08-08 NOTE — ED Notes (Signed)
Sent to hospital for vascular study

## 2018-08-10 ENCOUNTER — Other Ambulatory Visit: Payer: Self-pay

## 2018-08-10 ENCOUNTER — Ambulatory Visit (INDEPENDENT_AMBULATORY_CARE_PROVIDER_SITE_OTHER): Payer: Medicare Other | Admitting: Emergency Medicine

## 2018-08-10 ENCOUNTER — Encounter: Payer: Self-pay | Admitting: Emergency Medicine

## 2018-08-10 ENCOUNTER — Ambulatory Visit: Payer: Medicare Other | Admitting: Primary Care

## 2018-08-10 DIAGNOSIS — J479 Bronchiectasis, uncomplicated: Secondary | ICD-10-CM

## 2018-08-10 DIAGNOSIS — J301 Allergic rhinitis due to pollen: Secondary | ICD-10-CM | POA: Diagnosis not present

## 2018-08-10 MED ORDER — PREDNISONE 10 MG PO TABS: 10.0000 mg | ORAL_TABLET | Freq: Every day | ORAL | 2 refills | Status: DC

## 2018-08-10 NOTE — Assessment & Plan Note (Signed)
Continue same regimen 

## 2018-08-10 NOTE — Patient Instructions (Signed)
Please continue your albuterol nebulizers twice a day Do your chest vest twice a day Complete Keflex as planned for your left leg. Continue rotating antibiotics as per our routine on your normal schedule We will continue prednisone 10 mg daily.  As long as there are no side effects then we will try to continue this dose Follow with [name] in 2 months or sooner if you have any problems.

## 2018-08-10 NOTE — Progress Notes (Signed)
Subjective:    Patient ID: Sarah FlavorsGertrude [name], female    DOB: Jan 24, 1921, 85 y.o.   MRN: [id number]  HPI  ROV 01/22/18 --pleasant 85 year old woman with bronchiectasis, chronic asthmatic bronchitis, chronic multifactorial cough.  She has been using prednisone 10 mg daily, chest vibration vest - less frequently than before.  Rotating antibiotics now azithromycin and cefdinir.  She has to clear secretions about 2x a day - time consuming, takes about 1 hour.   ROV 07/06/2018 --Sarah [name] is 85, has a history of bronchiectasis and chronic asthmatic bronchitis, cough.  She has been on chronic prednisone 5mg , chest vibration vest and rotating antibiotics.  She had a flare in March that was treated as a lobar pneumonia that was associated with hemoptysis. She is back on rotating abx: azithro, cefdinir. She has had relative hypotension, had her BP regimen adjusted by Cardiology end of March (stopped losartan). She has been having falls at home. She feels more weak since PNA in March. She has a lot of cough, clears mucous frequently, not always associated with chest vest which she does daily.   ROV 08/10/2018 --this is a follow-up visit for Sarah [name], 85 with significant bronchiectasis and chronic asthmatic bronchitis, chronic cough.  We have been managing her on prednisone, chest vibration and rotating antibiotics.  At her last visit we tried increasing the frequency of her chest vest, increasing the frequency of her albuterol nebs.  I also increased her prednisone to 15 mg daily for 1 week, then down to 10 mg.  She is on 10 mg now. Feels that her breathing and cough, mucous burden are improved. Her QOL is improved on the Pred 10mg . She is on keflex for a LLE cellulitis - has helped her mucous burden some.    Review of Systems As above       Objective:   Physical Exam  Vitals:   08/10/18 1116  BP: 118/70  Pulse: 80  SpO2: 92%  Weight: 136 lb (61.7 kg)  Height: 5\' 4"  (1.626 m)    GEN: A/Ox3, a bit  anxious, NAD, elderly and thin. Weak. In a wheelchair   HEENT:  OP clear  NECK:  No stridor  RESP distant, no wheeze, few scattered crackles.   CARD:  Regular, no M  Musco: some chronic venous stasis and edema changes B LE L>R  Neuro: awake, poor hearing, oriented  Skin: erythema LLE, an open sore on lateral LE without purulence,.       Assessment & Plan:  Allergic rhinitis Continue same regimen  BRONCHIECTASIS She has been experiencing more fatigue, decreased functional capacity.  I continued her rotating antibiotics, interrupted slightly by her Keflex but we will pick back up after the Keflex has been completed.  I increased her prednisone to 10 mg and she feels that her Q OL has benefited, dyspnea and mucus burden are improved.  I do want to ensure that she does not have significant side effects from the increase, in particular she is now being treated for left lower extremity cellulitis.  We need to ensure that she gets adequate healing on relative immunosuppression.  If not then we may have to adjust going forward.  Please continue your albuterol nebulizers twice a day Do your chest vest twice a day Complete Keflex as planned for your left leg. Continue rotating antibiotics as per our routine on your normal schedule We will continue prednisone 10 mg daily.  As long as there are no side effects then we will  try to continue this dose Follow with [name] in 2 months or sooner if you have any problems.  Baltazar Apo, MD, PhD 08/10/2018, 11:36 AM [facility] [phone] or if no answer [phone]

## 2018-08-10 NOTE — Assessment & Plan Note (Signed)
She has been experiencing more fatigue, decreased functional capacity.  I continued her rotating antibiotics, interrupted slightly by her Keflex but we will pick back up after the Keflex has been completed.  I increased her prednisone to 10 mg and she feels that her Q OL has benefited, dyspnea and mucus burden are improved.  I do want to ensure that she does not have significant side effects from the increase, in particular she is now being treated for left lower extremity cellulitis.  We need to ensure that she gets adequate healing on relative immunosuppression.  If not then we may have to adjust going forward.  Please continue your albuterol nebulizers twice a day Do your chest vest twice a day Complete Keflex as planned for your left leg. Continue rotating antibiotics as per our routine on your normal schedule We will continue prednisone 10 mg daily.  As long as there are no side effects then we will try to continue this dose Follow with [name] in 2 months or sooner if you have any problems.

## 2018-08-11 DIAGNOSIS — L03115 Cellulitis of right lower limb: Secondary | ICD-10-CM | POA: Insufficient documentation

## 2018-08-11 DIAGNOSIS — L03114 Cellulitis of left upper limb: Secondary | ICD-10-CM | POA: Insufficient documentation

## 2018-08-22 DIAGNOSIS — Z8673 Personal history of transient ischemic attack (TIA), and cerebral infarction without residual deficits: Secondary | ICD-10-CM | POA: Diagnosis not present

## 2018-08-22 DIAGNOSIS — J479 Bronchiectasis, uncomplicated: Secondary | ICD-10-CM | POA: Diagnosis not present

## 2018-08-22 DIAGNOSIS — I890 Lymphedema, not elsewhere classified: Secondary | ICD-10-CM | POA: Diagnosis not present

## 2018-08-22 DIAGNOSIS — G629 Polyneuropathy, unspecified: Secondary | ICD-10-CM | POA: Diagnosis not present

## 2018-08-22 DIAGNOSIS — E559 Vitamin D deficiency, unspecified: Secondary | ICD-10-CM | POA: Diagnosis not present

## 2018-08-22 DIAGNOSIS — G43909 Migraine, unspecified, not intractable, without status migrainosus: Secondary | ICD-10-CM | POA: Diagnosis not present

## 2018-08-22 DIAGNOSIS — E039 Hypothyroidism, unspecified: Secondary | ICD-10-CM | POA: Diagnosis not present

## 2018-08-22 DIAGNOSIS — E46 Unspecified protein-calorie malnutrition: Secondary | ICD-10-CM | POA: Diagnosis not present

## 2018-08-22 DIAGNOSIS — H3530 Unspecified macular degeneration: Secondary | ICD-10-CM | POA: Diagnosis not present

## 2018-08-22 DIAGNOSIS — F410 Panic disorder [episodic paroxysmal anxiety] without agoraphobia: Secondary | ICD-10-CM | POA: Diagnosis not present

## 2018-08-22 DIAGNOSIS — L03114 Cellulitis of left upper limb: Secondary | ICD-10-CM | POA: Diagnosis not present

## 2018-08-22 DIAGNOSIS — I720 Aneurysm of carotid artery: Secondary | ICD-10-CM | POA: Diagnosis not present

## 2018-08-22 DIAGNOSIS — D509 Iron deficiency anemia, unspecified: Secondary | ICD-10-CM | POA: Diagnosis not present

## 2018-08-22 DIAGNOSIS — L03115 Cellulitis of right lower limb: Secondary | ICD-10-CM | POA: Diagnosis not present

## 2018-08-22 DIAGNOSIS — R1310 Dysphagia, unspecified: Secondary | ICD-10-CM | POA: Diagnosis not present

## 2018-08-22 DIAGNOSIS — I129 Hypertensive chronic kidney disease with stage 1 through stage 4 chronic kidney disease, or unspecified chronic kidney disease: Secondary | ICD-10-CM | POA: Diagnosis not present

## 2018-08-22 DIAGNOSIS — N183 Chronic kidney disease, stage 3 (moderate): Secondary | ICD-10-CM | POA: Diagnosis not present

## 2018-08-22 DIAGNOSIS — F339 Major depressive disorder, recurrent, unspecified: Secondary | ICD-10-CM | POA: Diagnosis not present

## 2018-08-22 DIAGNOSIS — R7303 Prediabetes: Secondary | ICD-10-CM | POA: Diagnosis not present

## 2018-08-22 DIAGNOSIS — H35033 Hypertensive retinopathy, bilateral: Secondary | ICD-10-CM | POA: Diagnosis not present

## 2018-08-26 ENCOUNTER — Telehealth: Payer: Self-pay | Admitting: Licensed Clinical Social Worker

## 2018-08-26 NOTE — Telephone Encounter (Signed)
Palliative Care SW left a vm for patient's daughter, [name], to schedule a visit.

## 2018-08-31 DIAGNOSIS — I129 Hypertensive chronic kidney disease with stage 1 through stage 4 chronic kidney disease, or unspecified chronic kidney disease: Secondary | ICD-10-CM | POA: Diagnosis not present

## 2018-08-31 DIAGNOSIS — N183 Chronic kidney disease, stage 3 (moderate): Secondary | ICD-10-CM | POA: Diagnosis not present

## 2018-08-31 DIAGNOSIS — L03115 Cellulitis of right lower limb: Secondary | ICD-10-CM | POA: Diagnosis not present

## 2018-08-31 DIAGNOSIS — I890 Lymphedema, not elsewhere classified: Secondary | ICD-10-CM | POA: Diagnosis not present

## 2018-08-31 DIAGNOSIS — J479 Bronchiectasis, uncomplicated: Secondary | ICD-10-CM | POA: Diagnosis not present

## 2018-08-31 DIAGNOSIS — L03114 Cellulitis of left upper limb: Secondary | ICD-10-CM | POA: Diagnosis not present

## 2018-09-03 DIAGNOSIS — L03114 Cellulitis of left upper limb: Secondary | ICD-10-CM | POA: Diagnosis not present

## 2018-09-03 DIAGNOSIS — E039 Hypothyroidism, unspecified: Secondary | ICD-10-CM | POA: Diagnosis not present

## 2018-09-03 DIAGNOSIS — F339 Major depressive disorder, recurrent, unspecified: Secondary | ICD-10-CM | POA: Diagnosis not present

## 2018-09-03 DIAGNOSIS — N183 Chronic kidney disease, stage 3 (moderate): Secondary | ICD-10-CM | POA: Diagnosis not present

## 2018-09-03 DIAGNOSIS — I129 Hypertensive chronic kidney disease with stage 1 through stage 4 chronic kidney disease, or unspecified chronic kidney disease: Secondary | ICD-10-CM | POA: Diagnosis not present

## 2018-09-03 DIAGNOSIS — L0390 Cellulitis, unspecified: Secondary | ICD-10-CM | POA: Diagnosis not present

## 2018-09-03 DIAGNOSIS — I10 Essential (primary) hypertension: Secondary | ICD-10-CM | POA: Diagnosis not present

## 2018-09-03 DIAGNOSIS — L03115 Cellulitis of right lower limb: Secondary | ICD-10-CM | POA: Diagnosis not present

## 2018-09-03 DIAGNOSIS — I890 Lymphedema, not elsewhere classified: Secondary | ICD-10-CM | POA: Diagnosis not present

## 2018-09-03 DIAGNOSIS — J479 Bronchiectasis, uncomplicated: Secondary | ICD-10-CM | POA: Diagnosis not present

## 2018-09-03 DIAGNOSIS — F4001 Agoraphobia with panic disorder: Secondary | ICD-10-CM | POA: Diagnosis not present

## 2018-09-07 DIAGNOSIS — Z119 Encounter for screening for infectious and parasitic diseases, unspecified: Secondary | ICD-10-CM | POA: Diagnosis not present

## 2018-09-08 DIAGNOSIS — L03115 Cellulitis of right lower limb: Secondary | ICD-10-CM | POA: Diagnosis not present

## 2018-09-08 DIAGNOSIS — N183 Chronic kidney disease, stage 3 (moderate): Secondary | ICD-10-CM | POA: Diagnosis not present

## 2018-09-08 DIAGNOSIS — L03114 Cellulitis of left upper limb: Secondary | ICD-10-CM | POA: Diagnosis not present

## 2018-09-08 DIAGNOSIS — I890 Lymphedema, not elsewhere classified: Secondary | ICD-10-CM | POA: Diagnosis not present

## 2018-09-08 DIAGNOSIS — I129 Hypertensive chronic kidney disease with stage 1 through stage 4 chronic kidney disease, or unspecified chronic kidney disease: Secondary | ICD-10-CM | POA: Diagnosis not present

## 2018-09-08 DIAGNOSIS — J479 Bronchiectasis, uncomplicated: Secondary | ICD-10-CM | POA: Diagnosis not present

## 2018-09-10 DIAGNOSIS — J479 Bronchiectasis, uncomplicated: Secondary | ICD-10-CM | POA: Diagnosis not present

## 2018-09-10 DIAGNOSIS — L03115 Cellulitis of right lower limb: Secondary | ICD-10-CM | POA: Diagnosis not present

## 2018-09-10 DIAGNOSIS — I129 Hypertensive chronic kidney disease with stage 1 through stage 4 chronic kidney disease, or unspecified chronic kidney disease: Secondary | ICD-10-CM | POA: Diagnosis not present

## 2018-09-10 DIAGNOSIS — L03114 Cellulitis of left upper limb: Secondary | ICD-10-CM | POA: Diagnosis not present

## 2018-09-10 DIAGNOSIS — I890 Lymphedema, not elsewhere classified: Secondary | ICD-10-CM | POA: Diagnosis not present

## 2018-09-10 DIAGNOSIS — N183 Chronic kidney disease, stage 3 (moderate): Secondary | ICD-10-CM | POA: Diagnosis not present

## 2018-09-11 ENCOUNTER — Other Ambulatory Visit: Payer: Self-pay

## 2018-09-11 ENCOUNTER — Other Ambulatory Visit: Payer: Medicare Other | Admitting: Licensed Clinical Social Worker

## 2018-09-11 DIAGNOSIS — Z515 Encounter for palliative care: Secondary | ICD-10-CM

## 2018-09-11 NOTE — Progress Notes (Signed)
COMMUNITY PALLIATIVE CARE SW NOTE  PATIENT NAME: Sarah [name] DOB: 07-08-1921 MRN: [id number]  PRIMARY CARE PROVIDER: [name], MD  RESPONSIBLE PARTY:  Acct ID - Guarantor Home Phone Work Phone Relationship Acct Type  [id number] Chales [phone]  Self P/F     [address], [place], [place] [address]  Due to the COVID-19 crisis, this virtual check-in visit was done via telephone from my office and it was initiated and consent given by thispatientand or family.   PLAN OF CARE and INTERVENTIONS:             1. GOALS OF CARE/ ADVANCE CARE PLANNING:  Goal is for patient to remain in her apartment at [facility].  She is a DNR. 2. SOCIAL/EMOTIONAL/SPIRITUAL ASSESSMENT/ INTERVENTIONS:  SW conducted a Sales executive visit per daughters, [name], request.  She wished to discuss Palliative Care team roles and patient's status.  She had several questions about steroids and their effect on patient's eye sight.  SW provided active listening and supportive counseling.  SW offered a visit and she declined.  She requested a call from Daryl Eastern, Palliative Care RN.  SW provided her with the information. 3. PATIENT/CAREGIVER EDUCATION/ COPING:  Daughter and patient cope by researching all aspects of patient's care. 4. PERSONAL EMERGENCY PLAN:  EMS is contacted.  There are call bells in patient's apartment. 5. COMMUNITY RESOURCES COORDINATION/ HEALTH CARE NAVIGATION:  None. 6. FINANCIAL/LEGAL CONCERNS/INTERVENTIONS:  None.     SOCIAL HX:  Social History   Tobacco Use  . Smoking status: Never Smoker  . Smokeless tobacco: Never Used  Substance Use Topics  . Alcohol use: Yes    Alcohol/week: 2.0 standard drinks    Types: 2 Glasses of wine per week    Comment: wine 1-2 week    CODE STATUS:  DNR ADVANCED DIRECTIVES:  No MOST FORM COMPLETE:  No HOSPICE EDUCATION PROVIDED:  No PPS:  Appetite is normal.  She is able to ambulate. Duration of visit and documentation:  30  minutes.      Creola Corn Marcoantonio Legault, LCSW

## 2018-09-15 DIAGNOSIS — L03114 Cellulitis of left upper limb: Secondary | ICD-10-CM | POA: Diagnosis not present

## 2018-09-15 DIAGNOSIS — J479 Bronchiectasis, uncomplicated: Secondary | ICD-10-CM | POA: Diagnosis not present

## 2018-09-15 DIAGNOSIS — L03115 Cellulitis of right lower limb: Secondary | ICD-10-CM | POA: Diagnosis not present

## 2018-09-15 DIAGNOSIS — N183 Chronic kidney disease, stage 3 (moderate): Secondary | ICD-10-CM | POA: Diagnosis not present

## 2018-09-15 DIAGNOSIS — I129 Hypertensive chronic kidney disease with stage 1 through stage 4 chronic kidney disease, or unspecified chronic kidney disease: Secondary | ICD-10-CM | POA: Diagnosis not present

## 2018-09-15 DIAGNOSIS — I890 Lymphedema, not elsewhere classified: Secondary | ICD-10-CM | POA: Diagnosis not present

## 2018-09-16 ENCOUNTER — Other Ambulatory Visit: Payer: Self-pay

## 2018-09-16 ENCOUNTER — Ambulatory Visit (INDEPENDENT_AMBULATORY_CARE_PROVIDER_SITE_OTHER): Payer: Medicare Other | Admitting: Podiatry

## 2018-09-16 ENCOUNTER — Encounter: Payer: Self-pay | Admitting: Podiatry

## 2018-09-16 DIAGNOSIS — M79674 Pain in right toe(s): Secondary | ICD-10-CM

## 2018-09-16 DIAGNOSIS — B351 Tinea unguium: Secondary | ICD-10-CM

## 2018-09-16 DIAGNOSIS — L84 Corns and callosities: Secondary | ICD-10-CM

## 2018-09-16 DIAGNOSIS — M79675 Pain in left toe(s): Secondary | ICD-10-CM | POA: Diagnosis not present

## 2018-09-16 NOTE — Patient Instructions (Addendum)
APPLY ANTIBIOTIC OINTMENT TO LEFT THIRD TOE ONCE DAILY UNTIL HEALED  Onychomycosis/Fungal Toenails  WHAT IS IT? An infection that lies within the keratin of your nail plate that is caused by a fungus.  WHY ME? Fungal infections affect all ages, sexes, races, and creeds.  There may be many factors that predispose you to a fungal infection such as age, coexisting medical conditions such as diabetes, or an autoimmune disease; stress, medications, fatigue, genetics, etc.  Bottom line: fungus thrives in a warm, moist environment and your shoes offer such a location.  IS IT CONTAGIOUS? Theoretically, yes.  You do not want to share shoes, nail clippers or files with someone who has fungal toenails.  Walking around barefoot in the same room or sleeping in the same bed is unlikely to transfer the organism.  It is important to realize, however, that fungus can spread easily from one nail to the next on the same foot.  HOW DO WE TREAT THIS?  There are several ways to treat this condition.  Treatment may depend on many factors such as age, medications, pregnancy, liver and kidney conditions, etc.  It is best to ask your doctor which options are available to you.  1. No treatment.   Unlike many other medical concerns, you can live with this condition.  However for many people this can be a painful condition and may lead to ingrown toenails or a bacterial infection.  It is recommended that you keep the nails cut short to help reduce the amount of fungal nail. 2. Topical treatment.  These range from herbal remedies to prescription strength nail lacquers.  About 40-50% effective, topicals require twice daily application for approximately 9 to 12 months or until an entirely new nail has grown out.  The most effective topicals are medical grade medications available through physicians offices. 3. Oral antifungal medications.  With an 80-90% cure rate, the most common oral medication requires 3 to 4 months of therapy and  stays in your system for a year as the new nail grows out.  Oral antifungal medications do require blood work to make sure it is a safe drug for you.  A liver function panel will be performed prior to starting the medication and after the first month of treatment.  It is important to have the blood work performed to avoid any harmful side effects.  In general, this medication safe but blood work is required. 4. Laser Therapy.  This treatment is performed by applying a specialized laser to the affected nail plate.  This therapy is noninvasive, fast, and non-painful.  It is not covered by insurance and is therefore, out of pocket.  The results have been very good with a 80-95% cure rate.  The [facility] is the only practice in the area to offer this therapy. 5. Permanent Nail Avulsion.  Removing the entire nail so that a new nail will not grow back.

## 2018-09-17 DIAGNOSIS — H353210 Exudative age-related macular degeneration, right eye, stage unspecified: Secondary | ICD-10-CM | POA: Diagnosis not present

## 2018-09-17 DIAGNOSIS — H401131 Primary open-angle glaucoma, bilateral, mild stage: Secondary | ICD-10-CM | POA: Diagnosis not present

## 2018-09-17 DIAGNOSIS — H353122 Nonexudative age-related macular degeneration, left eye, intermediate dry stage: Secondary | ICD-10-CM | POA: Diagnosis not present

## 2018-09-18 ENCOUNTER — Other Ambulatory Visit: Payer: Self-pay | Admitting: Emergency Medicine

## 2018-09-21 DIAGNOSIS — E039 Hypothyroidism, unspecified: Secondary | ICD-10-CM | POA: Diagnosis not present

## 2018-09-21 DIAGNOSIS — R1310 Dysphagia, unspecified: Secondary | ICD-10-CM | POA: Diagnosis not present

## 2018-09-21 DIAGNOSIS — L03114 Cellulitis of left upper limb: Secondary | ICD-10-CM | POA: Diagnosis not present

## 2018-09-21 DIAGNOSIS — I129 Hypertensive chronic kidney disease with stage 1 through stage 4 chronic kidney disease, or unspecified chronic kidney disease: Secondary | ICD-10-CM | POA: Diagnosis not present

## 2018-09-21 DIAGNOSIS — F410 Panic disorder [episodic paroxysmal anxiety] without agoraphobia: Secondary | ICD-10-CM | POA: Diagnosis not present

## 2018-09-21 DIAGNOSIS — R7303 Prediabetes: Secondary | ICD-10-CM | POA: Diagnosis not present

## 2018-09-21 DIAGNOSIS — H35033 Hypertensive retinopathy, bilateral: Secondary | ICD-10-CM | POA: Diagnosis not present

## 2018-09-21 DIAGNOSIS — I720 Aneurysm of carotid artery: Secondary | ICD-10-CM | POA: Diagnosis not present

## 2018-09-21 DIAGNOSIS — E559 Vitamin D deficiency, unspecified: Secondary | ICD-10-CM | POA: Diagnosis not present

## 2018-09-21 DIAGNOSIS — F339 Major depressive disorder, recurrent, unspecified: Secondary | ICD-10-CM | POA: Diagnosis not present

## 2018-09-21 DIAGNOSIS — G43909 Migraine, unspecified, not intractable, without status migrainosus: Secondary | ICD-10-CM | POA: Diagnosis not present

## 2018-09-21 DIAGNOSIS — D509 Iron deficiency anemia, unspecified: Secondary | ICD-10-CM | POA: Diagnosis not present

## 2018-09-21 DIAGNOSIS — H3530 Unspecified macular degeneration: Secondary | ICD-10-CM | POA: Diagnosis not present

## 2018-09-21 DIAGNOSIS — G629 Polyneuropathy, unspecified: Secondary | ICD-10-CM | POA: Diagnosis not present

## 2018-09-21 DIAGNOSIS — E46 Unspecified protein-calorie malnutrition: Secondary | ICD-10-CM | POA: Diagnosis not present

## 2018-09-21 DIAGNOSIS — J479 Bronchiectasis, uncomplicated: Secondary | ICD-10-CM | POA: Diagnosis not present

## 2018-09-21 DIAGNOSIS — I890 Lymphedema, not elsewhere classified: Secondary | ICD-10-CM | POA: Diagnosis not present

## 2018-09-21 DIAGNOSIS — N183 Chronic kidney disease, stage 3 (moderate): Secondary | ICD-10-CM | POA: Diagnosis not present

## 2018-09-21 DIAGNOSIS — L03115 Cellulitis of right lower limb: Secondary | ICD-10-CM | POA: Diagnosis not present

## 2018-09-21 DIAGNOSIS — Z8673 Personal history of transient ischemic attack (TIA), and cerebral infarction without residual deficits: Secondary | ICD-10-CM | POA: Diagnosis not present

## 2018-09-22 DIAGNOSIS — J479 Bronchiectasis, uncomplicated: Secondary | ICD-10-CM | POA: Diagnosis not present

## 2018-09-22 DIAGNOSIS — N183 Chronic kidney disease, stage 3 (moderate): Secondary | ICD-10-CM | POA: Diagnosis not present

## 2018-09-22 DIAGNOSIS — I129 Hypertensive chronic kidney disease with stage 1 through stage 4 chronic kidney disease, or unspecified chronic kidney disease: Secondary | ICD-10-CM | POA: Diagnosis not present

## 2018-09-22 DIAGNOSIS — L03115 Cellulitis of right lower limb: Secondary | ICD-10-CM | POA: Diagnosis not present

## 2018-09-22 DIAGNOSIS — I890 Lymphedema, not elsewhere classified: Secondary | ICD-10-CM | POA: Diagnosis not present

## 2018-09-22 DIAGNOSIS — L03114 Cellulitis of left upper limb: Secondary | ICD-10-CM | POA: Diagnosis not present

## 2018-09-23 NOTE — Progress Notes (Signed)
Subjective: Sarah [name] is a 85 y.o. y.o. female who is on long term blood thinner Plavix presents today with painful, discolored, thick toenails  which interfere with daily activities. Pain is aggravated when wearing enclosed shoe gear. Pain is relieved with periodic professional debridement.  Patient is accompanied by her son-in-law on today's visit.   Sarah [name] did attempt to cut her own toenails which resulted in injury to left 3rd digit.    Current Outpatient Medications:  .  albuterol (PROVENTIL HFA;VENTOLIN HFA) 108 (90 Base) MCG/ACT inhaler, Inhale 1-2 puffs into the lungs every 6 (six) hours as needed for wheezing or shortness of breath., Disp: 1 Inhaler, Rfl: 6 .  albuterol (PROVENTIL) (2.5 MG/3ML) 0.083% nebulizer solution, USE 1 VIAL VIA NEBULIZER TWICE DAILY, Disp: 720 mL, Rfl: 3 .  ALPRAZolam (XANAX) 0.5 MG tablet, Take 1 tablet by mouth 3 (three) times daily., Disp: , Rfl:  .  azithromycin (ZITHROMAX) 250 MG tablet, TAKE 2 TABLETS BY MOUTH TODAY THEN TAKE 1 TABLET DAILY FOR 4 DAYS, Disp: 6 tablet, Rfl: 2 .  calcium carbonate (TUMS - DOSED IN MG ELEMENTAL CALCIUM) 500 MG chewable tablet, Chew 1 tablet by mouth every 8 (eight) hours as needed for indigestion or heartburn., Disp: , Rfl:  .  carvedilol (COREG) 6.25 MG tablet, TAKE 1 TABLET(6.25 MG) BY MOUTH TWICE DAILY, Disp: 180 tablet, Rfl: 2 .  cefdinir (OMNICEF) 300 MG capsule, TAKE ONE CAPSULE BY MOUTH TWICE DAILY, Disp: 60 capsule, Rfl: 2 .  cephALEXin (KEFLEX) 500 MG capsule, Take 1 capsule (500 mg total) by mouth 4 (four) times daily., Disp: 28 capsule, Rfl: 0 .  clopidogrel (PLAVIX) 75 MG tablet, Take 1 tablet (75 mg total) by mouth daily., Disp: 30 tablet, Rfl: 11 .  DULoxetine (CYMBALTA) 20 MG capsule, Take 20 mg by mouth 3 (three) times daily., Disp: , Rfl:  .  escitalopram (LEXAPRO) 5 MG tablet, Take 2.5 mg by mouth daily., Disp: , Rfl:  .  gabapentin (NEURONTIN) 100 MG capsule, Take 300 mg by mouth 3 (three) times  daily. , Disp: , Rfl:  .  latanoprost (XALATAN) 0.005 % ophthalmic solution, Place 1 drop into both eyes at bedtime. , Disp: , Rfl:  .  levothyroxine (SYNTHROID, LEVOTHROID) 50 MCG tablet, Take 50 mcg by mouth daily.  , Disp: , Rfl:  .  loratadine (CLARITIN) 10 MG tablet, Take 10 mg by mouth daily., Disp: , Rfl:  .  MULTIPLE VITAMIN PO, Take 1 tablet by mouth daily., Disp: , Rfl:  .  mupirocin ointment (BACTROBAN) 2 %, Apply to toes daily, Disp: 22 g, Rfl: 0 .  pantoprazole (PROTONIX) 40 MG tablet, Take 40 mg by mouth 2 (two) times daily., Disp: , Rfl:  .  predniSONE (DELTASONE) 10 MG tablet, Take 1 tablet (10 mg total) by mouth daily with breakfast., Disp: 30 tablet, Rfl: 2 .  predniSONE (DELTASONE) 5 MG tablet, Take 5 mg by mouth daily with breakfast., Disp: , Rfl:  .  Probiotic Product (PROBIOTIC ADVANCED PO), Take 1 tablet by mouth daily., Disp: , Rfl:  .  Sennosides (SENOKOT PO), Take 1 tablet by mouth as needed. , Disp: , Rfl:  .  sulfamethoxazole-trimethoprim (BACTRIM DS) 800-160 MG tablet, , Disp: , Rfl:  .  VITAMIN D, CHOLECALCIFEROL, PO, Take 2 tablets by mouth daily. , Disp: , Rfl:    Allergies  Allergen Reactions  . Codeine Anaphylaxis  . Hydrocodone Nausea And Vomiting and Other (See Comments)    SYNCOPE AND BRADYCARDIA  .  Isosorbide Nitrate Other (See Comments)    BRADYCARDIA  . Oxycodone Palpitations    Rapid heart beat  . Clarithromycin Nausea And Vomiting    Has patient had a PCN reaction causing immediate rash, facial/tongue/throat swelling, SOB or lightheadedness with hypotension: Unknown Has patient had a PCN reaction causing severe rash involving mucus membranes or skin necrosis: Unknown Has patient had a PCN reaction that required hospitalization: Unknown Has patient had a PCN reaction occurring within the last 10 years: Unknown If all of the above answers are "NO", then may proceed with Cephalosporin use.   . Fiorinal [Butalbital-Aspirin-Caffeine] Swelling and  Other (See Comments)    Facial swelling   . Levofloxacin Nausea And Vomiting and Other (See Comments)    SYNCOPE ALSO  . Augmentin [Amoxicillin-Pot Clavulanate] Diarrhea  . Doxycycline     Sweats and aches     Objective:  Vascular Examination: Capillary refill time less than 3 seconds x 10 digits.  Dorsalis pedis pulses palpable b/l.  Posterior tibial pulses palpable b/l.  No digital hair x 10 digits.  Skin temperature gradient WNL b/l.  Dermatological Examination: Skin noted to be thin and atrophic b/l.  Toenails 1-5 b/l discolored, thick, dystrophic with subungual debris and pain with palpation to nailbeds due to thickness of nails.  Evidence of laceration distal tip left 3rd digit. No erythema, no edema, no drainage, no flocculence.  Hyperkeratotic lesion dorsal 5th PIPJ, lateral 4th digit, dorsal PIPJ left 4th digit, distal tip right 4th digit. No erythema, no edema, no drainage, no flocculence noted.  Laceration distal tip left 3rd digit which bleeds easily when disturbed. No erythema, no edema, no drainage, no flocculence.  Musculoskeletal: Muscle strength 5/5 to all LE muscle groups.  Hammertoes b/l.  Neurological: Sensation intact 5/5 b/l with 10 gram monofilament.  Vibratory sensation intact.  Assessment: 1. Painful onychomycosis toenails 1-5 b/l in patient on blood thinner.  2. Corns dorsal 5th PIPJ, lateral 4th digit, dorsal PIPJ left 4th digit, distal tip right 4th digit  Plan: 1. Toenails 1-5 b/l were debrided in length and girth without iatrogenic bleeding. 2. Corn(s) pared dorsal 5th PIPJ right, lateral 4th digit, dorsal PIPJ left 4th digit, distal tip right 4th digit utilizing sterile scalpel blade without incident. 3. Laceration distal tip left 3rd digit addressed with Lumicain Hemostatic Solution. Cleansed with alcohol. Patient instructed to apply antibiotic ointment to left 3rd toe once daily until healed. 4. Patient to continue soft, supportive  shoe gear daily. 5. Order written on prescription pad for staff to remove all sharp instruments from her room and return them to her family.  6. Patient to report any pedal injuries to medical professional immediately. 7. Avoid self trimming due to use of blood thinner. 8. Follow up 3 months.  9. Patient/POA to call should there be a concern in the interim.

## 2018-10-01 ENCOUNTER — Telehealth: Payer: Self-pay | Admitting: Emergency Medicine

## 2018-10-01 ENCOUNTER — Telehealth: Payer: Self-pay | Admitting: *Deleted

## 2018-10-01 NOTE — Telephone Encounter (Signed)
Spoke with pt's daughter, [name]. Advised her that pt can get her flu shot when she comes in for her appointment. Advised her that she could only get a flu shot if she was a pt in our office, she states that she isn't. Nothing further was needed at this time.

## 2018-10-01 NOTE — Telephone Encounter (Signed)
Spoke with patient's daughter, [name], who is returning my phone call from earlier this week. Visit scheduled for 10/12/18 at 11:00a.

## 2018-10-07 ENCOUNTER — Other Ambulatory Visit: Payer: Self-pay | Admitting: Cardiovascular Disease

## 2018-10-08 ENCOUNTER — Other Ambulatory Visit: Payer: Self-pay

## 2018-10-08 ENCOUNTER — Ambulatory Visit (INDEPENDENT_AMBULATORY_CARE_PROVIDER_SITE_OTHER): Payer: Medicare Other | Admitting: Emergency Medicine

## 2018-10-08 ENCOUNTER — Encounter: Payer: Self-pay | Admitting: Emergency Medicine

## 2018-10-08 DIAGNOSIS — J479 Bronchiectasis, uncomplicated: Secondary | ICD-10-CM | POA: Diagnosis not present

## 2018-10-08 DIAGNOSIS — Z23 Encounter for immunization: Secondary | ICD-10-CM

## 2018-10-08 NOTE — Assessment & Plan Note (Addendum)
Overall improved and stable since last visit. Believe the risk / benefit still favors continuing her pred. I have encouraged her to restart chest vest so we won't get behind w secretion management  We will continue to alternate your antibiotics during the first week of each month: -Omnicef twice a day for 7 days -Azithromycin, 2 pills on the first day and then 1 pill for the next 4 days Please restart your chest vest therapy once daily Continue prednisone 10 mg once daily Continue to use your albuterol nebulizers as you need them.  It would probably be helpful for you to use at least once a day in conjunction with your chest vest to help clear your mucus more easily Flu shot today Follow with Dr Lamonte Sakai in 4 months or sooner if you have any problems.

## 2018-10-08 NOTE — Progress Notes (Signed)
   Subjective:    Patient ID: Sarah [name], female    DOB: 17-Jul-1921, 85 y.o.   MRN: [id number]  HPI  ROV 08/10/2018 --this is a follow-up visit for Sarah [name], 85 with significant bronchiectasis and chronic asthmatic bronchitis, chronic cough.  We have been managing her on prednisone, chest vibration and rotating antibiotics.  At her last visit we tried increasing the frequency of her chest vest, increasing the frequency of her albuterol nebs.  I also increased her prednisone to 15 mg daily for 1 week, then down to 10 mg.  She is on 10 mg now. Feels that her breathing and cough, mucous burden are improved. Her QOL is improved on the Pred 10mg . She is on keflex for a LLE cellulitis - has helped her mucous burden some.   ROV 10/08/2018 --Sarah [name] is 85 with a history of chronic asthmatic bronchitis and bronchiectasis.  She has chronic cough with both lower and upper airway involvement.  She is currently managed on prednisone 10 mg daily and rotating antibiotics- azithromycin, Omnicef.  She uses nebulized albuterol about once a day, loratadine. She is off flutter valve, chest vest right now. Over the last 2 months she has also been treated with keflex, bactrim for her LE cellulitis.    Review of Systems As above       Objective:   Physical Exam  Vitals:   10/08/18 1350  BP: (!) 136/96  Pulse: 92  SpO2: 93%  Weight: 135 lb (61.2 kg)  Height: 5\' 4"  (1.626 m)    GEN: A/Ox3, a bit anxious, NAD, elderly and thin. Weak. In a wheelchair   HEENT:  OP clear  NECK:  No stridor  RESP distant, no wheeze, few scattered crackles.   CARD:  Regular, no M  Musco: some chronic venous stasis, no edema. Her cellulitis has resolved L LE  Neuro: awake, poor hearing, oriented       Assessment & Plan:  BRONCHIECTASIS Overall improved and stable since last visit. Believe the risk / benefit still favors continuing her pred. I have encouraged her to restart chest vest so we won't get behind w  secretion management  We will continue to alternate your antibiotics during the first week of each month: -Omnicef twice a day for 7 days -Azithromycin, 2 pills on the first day and then 1 pill for the next 4 days Please restart your chest vest therapy once daily Continue prednisone 10 mg once daily Continue to use your albuterol nebulizers as you need them.  It would probably be helpful for you to use at least once a day in conjunction with your chest vest to help clear your mucus more easily Flu shot today Follow with Dr Lamonte Sakai in 4 months or sooner if you have any problems.  Baltazar Apo, MD, PhD 10/08/2018, 2:07 PM [facility] [phone] or if no answer [phone]

## 2018-10-08 NOTE — Patient Instructions (Signed)
We will continue to alternate your antibiotics during the first week of each month: -Omnicef twice a day for 7 days -Azithromycin, 2 pills on the first day and then 1 pill for the next 4 days Please restart your chest vest therapy once daily Continue prednisone 10 mg once daily Continue to use your albuterol nebulizers as you need them.  It would probably be helpful for you to use at least once a day in conjunction with your chest vest to help clear your mucus more easily Flu shot today Follow with Dr Lamonte Sakai in 4 months or sooner if you have any problems.

## 2018-10-12 ENCOUNTER — Other Ambulatory Visit: Payer: Medicare Other | Admitting: Licensed Clinical Social Worker

## 2018-10-12 ENCOUNTER — Other Ambulatory Visit: Payer: Medicare Other | Admitting: *Deleted

## 2018-10-12 ENCOUNTER — Ambulatory Visit: Payer: Medicare Other | Admitting: Primary Care

## 2018-10-12 ENCOUNTER — Other Ambulatory Visit: Payer: Self-pay

## 2018-10-12 DIAGNOSIS — Z515 Encounter for palliative care: Secondary | ICD-10-CM

## 2018-10-13 ENCOUNTER — Other Ambulatory Visit: Payer: Self-pay

## 2018-10-13 ENCOUNTER — Telehealth: Payer: Self-pay | Admitting: Cardiovascular Disease

## 2018-10-13 NOTE — Telephone Encounter (Signed)
Pt c/o BP issue: STAT if pt c/o blurred vision, one-sided weakness or slurred speech  1. What are your last 5 BP readings? Her top numbers have been in the 170 and 160- she did not have the exact readings  2. Are you having any other symptoms (ex. Dizziness, headache, blurred vision, passed out)?  Headache, vision is worse   3. What is your BP issue? *running high- pt wants to be seen soon*

## 2018-10-13 NOTE — Telephone Encounter (Signed)
Our best option is to schedule her to come into the office , either to see me or an APP.  I dont think we will be very useful with a virutal visit with her

## 2018-10-13 NOTE — Telephone Encounter (Signed)
I spoke with pt's daughter and gave her information from Dr. Acie Fredrickson.  I offered appt on October 16,2020 with Maryla Morrow, NP but she would like to wait and see Dr. Acie Fredrickson in November as scheduled.  Daughter will continue with prn Coreg when BP elevated.  I asked her if possible to check BP about 2 hours after taking AM dose of Coreg and keep record of readings.  I told her to also keep record of times it is elevated. Will cancel virtual visit.

## 2018-10-13 NOTE — Telephone Encounter (Signed)
I spoke with pt's daughter who reports pt's BP has been running higher for the last 2 weeks.  Had been good until then.  Readings have been 170-180/?.  Yesterday was 188/? and eventually down to 160/?. Daughter does not have exact readings with her. Some normal readings at times also.  Pt takes extra Xanax and half Coreg at times when BP up.  Daughter states it is hard to tell how often BP is up because often times pt does not tell her.  Pt did  call her daughter one time recently in the middle of the night with elevated BP.  Readings since Friday have consistently been 170-180. Daughter reports pt did feel better after HCTZ stopped earlier this year but they have noticed she is more frail and feeble lately.  Any change in her diet seems to affect her and they are not sure if she had eaten yesterday or had anything to drink when BP was elevated.  Note also states pt would like appointment soon.  I scheduled this for video visit on October 7,2020 at 8:40.  Also has in office follow up already scheduled for November 4,2020. Will send readings to Dr. Acie Fredrickson to see if changes in medication needed.  Daughter states if medication changes are made and appt on October 7 not needed she would be OK with cancelling it.

## 2018-10-13 NOTE — Progress Notes (Signed)
COMMUNITY PALLIATIVE CARE RN NOTE  PATIENT NAME: Sarah [name] DOB: 1921/05/19 MRN: [id number]  PRIMARY CARE PROVIDER: [name], MD  RESPONSIBLE PARTY:  Acct ID - Guarantor Home Phone Work Phone Relationship Acct Type  [id number] SYDNEY, [phone]  Self P/F     [address] [place], [place], [place] [address]   Covid-19 Pre-screening is Negative  PLAN OF CARE and INTERVENTION 1. ADVANCE CARE PLANNING/GOALS OF CARE: She wants to remain at [facility] facility for now. She has a DNR. 2. PATIENT/CAREGIVER EDUCATION: Explained Palliative Care services to patient/daughter, and Safe Mobility, BP Management 3. DISEASE STATUS: Joint visit made with Palliative Care SW, Sarah [name]. Met with patient and her daughter, Sarah [name], in patient's home. She is sitting on the couch awake and alert. Pleasant mood. Some word-finding difficulties noted. She denies pain or shortness of breath at this time. She does have a slight congested, non productive cough. She spoke about her diagnosis of Pneumonia in February and her issues with her MAC disease. She is on a prophylactic antibiotic. She remains able to perform ADLs independently, but does tire easily and feels weak at times. She ambulates using a cane. She has a PT consult pending through [facility], who is the therapy provider within her facility. She wants to get stronger. She feels her intake is adequate. She denies dysphagia. She is able to take her medications whole with thin liquids without difficulty. Her daughter, Sarah [name], fills her pill box for her. The facilities home health dept, [facility], has a nurse that visits patient daily to make sure that she takes her medications. She had a recent issue with cellulitis in her bilateral lower extremities along with weeping edema, but this has resolved. Her BP was elevated today 188/110. Sarah [name] says that her sister Sarah [name] handles what to do if patient's BP is elevated. She usually takes a 1/2 Xanax and wait about 30 min  then rechecks her BP to see if it has decreased. She also has PRN Coreg. She says the Xanax usually helps. Patient states that she is going to take a Xanax and lie down, then a nurse will be down there to give her 2 pm medications and she will then recheck her BP. Will continue to monitor.    HISTORY OF PRESENT ILLNESS:  This is a 85 yo female who resides at [facility] facility. Palliative care team continues to follow patient. Team will continue to visit monthly and PRN  CODE STATUS: DNR  ADVANCED DIRECTIVES: Y MOST FORM: no PPS: 50%   PHYSICAL EXAM:   VITALS: Today's Vitals   10/12/18 1215  BP: (!) 188/110  Pulse: 87  Resp: 18  Temp: (!) 97.4 F (36.3 C)  TempSrc: Temporal  SpO2: 90%  PainSc: 0-No pain    LUNGS: clear to auscultation  CARDIAC: Cor RRR EXTREMITIES: No edema SKIN: Scab noted to left lower leg, legs dark in appearance, exposed skin is dry and intact  NEURO: Alert and oriented x 3, forgetful, generalized weakness, ambulates with a cane   (Duration of visit and documentation 120 minutes)   Daryl Eastern, RN BSN
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
## 2018-10-15 ENCOUNTER — Other Ambulatory Visit: Payer: Medicare Other | Admitting: *Deleted

## 2018-10-15 ENCOUNTER — Other Ambulatory Visit: Payer: Self-pay

## 2018-10-15 DIAGNOSIS — R262 Difficulty in walking, not elsewhere classified: Secondary | ICD-10-CM | POA: Diagnosis not present

## 2018-10-15 DIAGNOSIS — R2681 Unsteadiness on feet: Secondary | ICD-10-CM | POA: Diagnosis not present

## 2018-10-15 DIAGNOSIS — M6281 Muscle weakness (generalized): Secondary | ICD-10-CM | POA: Diagnosis not present

## 2018-10-15 DIAGNOSIS — Z515 Encounter for palliative care: Secondary | ICD-10-CM

## 2018-10-15 DIAGNOSIS — J479 Bronchiectasis, uncomplicated: Secondary | ICD-10-CM | POA: Diagnosis not present

## 2018-10-16 DIAGNOSIS — J479 Bronchiectasis, uncomplicated: Secondary | ICD-10-CM | POA: Diagnosis not present

## 2018-10-16 DIAGNOSIS — M6281 Muscle weakness (generalized): Secondary | ICD-10-CM | POA: Diagnosis not present

## 2018-10-16 DIAGNOSIS — R262 Difficulty in walking, not elsewhere classified: Secondary | ICD-10-CM | POA: Diagnosis not present

## 2018-10-16 DIAGNOSIS — R2681 Unsteadiness on feet: Secondary | ICD-10-CM | POA: Diagnosis not present

## 2018-10-19 DIAGNOSIS — R2681 Unsteadiness on feet: Secondary | ICD-10-CM | POA: Diagnosis not present

## 2018-10-19 DIAGNOSIS — M6281 Muscle weakness (generalized): Secondary | ICD-10-CM | POA: Diagnosis not present

## 2018-10-19 DIAGNOSIS — R262 Difficulty in walking, not elsewhere classified: Secondary | ICD-10-CM | POA: Diagnosis not present

## 2018-10-19 DIAGNOSIS — J479 Bronchiectasis, uncomplicated: Secondary | ICD-10-CM | POA: Diagnosis not present

## 2018-10-19 NOTE — Progress Notes (Signed)
COMMUNITY PALLIATIVE CARE RN NOTE  PATIENT NAME: Sarah [name] DOB: January 04, 1922 MRN: [id number]  PRIMARY CARE PROVIDER: [name], MD  RESPONSIBLE PARTY:  Acct ID - Guarantor Home Phone Work Phone Relationship Acct Type  [id number] Chales [phone]  Self P/F     [address], [place], [place] [address]   Due to the COVID-19 crisis, this virtual check-in visit was done via telephone from my office and it was initiated and consent by this patient and or family.  PLAN OF CARE and INTERVENTION:  1. ADVANCE CARE PLANNING/GOALS OF CARE: Goal is for [place]. She has a DNR. 2. PATIENT/CAREGIVER EDUCATION: N/A 3. DISEASE STATUS: Patient's daughter, [name], states that patient has been having difficulties in the evenings standing from a seated position. Patient says that even when she makes it to a standing position, that her feet "just won't move." Unsure of cause. She has been getting more frail and weak lately. She is more active during the mornings, but difficulty standing usually occurs after she has been lying or sitting on the couch after lunch for several hours. She has started working with PT today, which through strengthening exercises, this issue may improve. [name] has instructed patient to utilize her emergency pendant if this happens in the future. If this becomes a persistent problem, then family may have to hire caregivers in the evenings to assist her. Daughter also advised that they spoke with her Cardiologist on 9/29 regarding her elevated BPs. They are going to continue with PRN Coreg. She also advised that MD wants patient to take her BP 2 hours after her am  Medications and keep a log. Will continue to monitor.   HISTORY OF PRESENT ILLNESS: This is a 85 yo patient who resides at [facility]. Palliative care team continues to follow patient. Will visit patient monthly and PRN.   CODE STATUS: DNR ADVANCED DIRECTIVES: Y MOST FORM: no  PPS:  50%   (Duration of visit and documentation 30 minutes)   Daryl Eastern, RN BSN

## 2018-10-21 ENCOUNTER — Telehealth: Payer: Medicare Other | Admitting: Cardiovascular Disease

## 2018-10-21 ENCOUNTER — Telehealth: Payer: Self-pay | Admitting: *Deleted

## 2018-10-21 DIAGNOSIS — R2681 Unsteadiness on feet: Secondary | ICD-10-CM | POA: Diagnosis not present

## 2018-10-21 DIAGNOSIS — J479 Bronchiectasis, uncomplicated: Secondary | ICD-10-CM | POA: Diagnosis not present

## 2018-10-21 DIAGNOSIS — M6281 Muscle weakness (generalized): Secondary | ICD-10-CM | POA: Diagnosis not present

## 2018-10-21 DIAGNOSIS — R262 Difficulty in walking, not elsewhere classified: Secondary | ICD-10-CM | POA: Diagnosis not present

## 2018-10-21 NOTE — Telephone Encounter (Signed)
9:35 am  Received a phone call from patient's daughter [name]. She is currently out of town and is requesting a Therapist, sports visit. She says that patient had a fall, without injury, and would like a follow up. Visit scheduled for tomorrow 10/22/18 at 3:30 pm.

## 2018-10-22 ENCOUNTER — Other Ambulatory Visit: Payer: Medicare Other | Admitting: *Deleted

## 2018-10-22 ENCOUNTER — Other Ambulatory Visit: Payer: Self-pay

## 2018-10-22 DIAGNOSIS — Z515 Encounter for palliative care: Secondary | ICD-10-CM

## 2018-10-22 DIAGNOSIS — M6281 Muscle weakness (generalized): Secondary | ICD-10-CM | POA: Diagnosis not present

## 2018-10-22 DIAGNOSIS — J479 Bronchiectasis, uncomplicated: Secondary | ICD-10-CM | POA: Diagnosis not present

## 2018-10-22 DIAGNOSIS — R262 Difficulty in walking, not elsewhere classified: Secondary | ICD-10-CM | POA: Diagnosis not present

## 2018-10-22 DIAGNOSIS — R2681 Unsteadiness on feet: Secondary | ICD-10-CM | POA: Diagnosis not present

## 2018-10-22 NOTE — Telephone Encounter (Signed)
Left message for patient's daughter regarding an opening in Dr. Elmarie Shiley schedule for later today and advised that if she would like to bring her mother in today to call back and book the appointment.

## 2018-10-23 NOTE — Progress Notes (Signed)
COMMUNITY PALLIATIVE CARE RN NOTE  PATIENT NAME: Sarah [name] DOB: 12-21-1921 MRN: [id number]  PRIMARY CARE PROVIDER: [name], MD  RESPONSIBLE PARTY:  Acct ID - Guarantor Home Phone Work Phone Relationship Acct Type  [id number] Chales [phone]  Self P/F     [place], [place], [place] [address]   Covid-19 Pre-screening Negative   PLAN OF CARE and INTERVENTION:  1. ADVANCE CARE PLANNING/GOALS OF CARE: Goal is for her to remain in her [place] apartment and avoid hospitalizations. She is a DNR. 2. PATIENT/CAREGIVER EDUCATION: BP Management, Safe Mobility/Transfers 3. DISEASE STATUS: Met with [place] in her [place] apartment at [facility]. She is awake and alert and able to answer simple questions. She is forgetful and has word-finding difficulties. Slight dyspnea noted on exertion, but she says that she feels ok. Occasional non productive congested cough. She spoke about having COPD and MAC disease which contributes to reason for coughing. She had a recent fall, 2 days ago where she slid off of her couch trying to reach her lamp to turn it off. She reports knocking everything off of her coffee table during the fall. She did press her Emergency pendant and a staff member was able to assist her off of the floor. She has 2 small dime-sized bruises, one noted to her L upper arm and another on L lower arm. She says that her L shoulder down to her L forearm became sore today. No swelling noted and she has full ROM. Recommended that she take her PRN Tylenol to help. She is ambulatory using a cane and is able to bathe, dress and feed herself independently. She monitors her BP daily. New instructions are for patient to take her BP 2 hours after taking her morning medications. At 9:30am today (2 hours after BP meds) her BP was 125/83 and is now 165/105 at 4:00 pm. She has PRN Coreg and Xanax available if needed. She states that she is going to continue sitting on the couch, relax and watch TV. She is  currently working with PT. She says that she does not like doing exercises, but feels that it is beneficial. She had 2 instances last week of not being able to stand from a seated position on the couch, and another time says that her feet "just would not move." She has not had any of these occurrences this week. She does not feel that she requires in home assistance to help her get ready for bed at night at this time, but knows that the day will come where she will require more assistance. I contacted her daughter, Sarah [name], to provide update regarding my visit with patient today. Daughter appreciative.   HISTORY OF PRESENT ILLNESS:  This is a 85 yo female who resides at [facility]. Palliative care team continues to follow patient. Will continue to visit patient monthly and PRN.  CODE STATUS: DNR ADVANCED DIRECTIVES: Y MOST FORM: no PPS: 50%   PHYSICAL EXAM:   VITALS: Today's Vitals   10/22/18 1606  BP: (!) 165/108  Pulse: 94  Resp: 20  Temp: 97.6 F (36.4 C)  TempSrc: Temporal  SpO2: 90%  PainSc: 2   PainLoc: Shoulder    LUNGS: clear to auscultation  CARDIAC: Cor RRR EXTREMITIES: No edema SKIN: Scattered bruising noted to bilateral upper and lower extremities, and 2 new bruises (noted above)  NEURO: Alert and oriente x 3, forgetful, word-finding difficulties, generalized weakness, ambulates with a cane   (Duration of visit and documentation 100 minutes)  Daryl Eastern, RN BSN

## 2018-10-26 DIAGNOSIS — R2681 Unsteadiness on feet: Secondary | ICD-10-CM | POA: Diagnosis not present

## 2018-10-26 DIAGNOSIS — J479 Bronchiectasis, uncomplicated: Secondary | ICD-10-CM | POA: Diagnosis not present

## 2018-10-26 DIAGNOSIS — R262 Difficulty in walking, not elsewhere classified: Secondary | ICD-10-CM | POA: Diagnosis not present

## 2018-10-26 DIAGNOSIS — M6281 Muscle weakness (generalized): Secondary | ICD-10-CM | POA: Diagnosis not present

## 2018-10-28 DIAGNOSIS — R2681 Unsteadiness on feet: Secondary | ICD-10-CM | POA: Diagnosis not present

## 2018-10-28 DIAGNOSIS — R262 Difficulty in walking, not elsewhere classified: Secondary | ICD-10-CM | POA: Diagnosis not present

## 2018-10-28 DIAGNOSIS — M6281 Muscle weakness (generalized): Secondary | ICD-10-CM | POA: Diagnosis not present

## 2018-10-28 DIAGNOSIS — J479 Bronchiectasis, uncomplicated: Secondary | ICD-10-CM | POA: Diagnosis not present

## 2018-11-02 ENCOUNTER — Telehealth: Payer: Self-pay

## 2018-11-02 ENCOUNTER — Other Ambulatory Visit: Payer: Medicare Other

## 2018-11-02 ENCOUNTER — Other Ambulatory Visit: Payer: Self-pay

## 2018-11-02 DIAGNOSIS — R2681 Unsteadiness on feet: Secondary | ICD-10-CM | POA: Diagnosis not present

## 2018-11-02 DIAGNOSIS — J479 Bronchiectasis, uncomplicated: Secondary | ICD-10-CM | POA: Diagnosis not present

## 2018-11-02 DIAGNOSIS — M6281 Muscle weakness (generalized): Secondary | ICD-10-CM | POA: Diagnosis not present

## 2018-11-02 DIAGNOSIS — R262 Difficulty in walking, not elsewhere classified: Secondary | ICD-10-CM | POA: Diagnosis not present

## 2018-11-02 NOTE — Telephone Encounter (Signed)
SW received message from [place] with palliative admin regarding reschedule of patient's previous schedule appointment with palliative care team.  SW contacted patient's daughter and she requested that palliative care team reschedule for 11-05-18 at 1:30PM.

## 2018-11-02 NOTE — Telephone Encounter (Signed)
SW received a message from [place] regarding reschedule of patient's previous schedule appointment with palliative care team.  SW contacted patient's daughter and she requested that palliative care team reschedule for 11-12-18 at 1:30PM. Notified RN.

## 2018-11-05 ENCOUNTER — Other Ambulatory Visit: Payer: Medicare Other

## 2018-11-05 DIAGNOSIS — R2681 Unsteadiness on feet: Secondary | ICD-10-CM | POA: Diagnosis not present

## 2018-11-05 DIAGNOSIS — R262 Difficulty in walking, not elsewhere classified: Secondary | ICD-10-CM | POA: Diagnosis not present

## 2018-11-05 DIAGNOSIS — M6281 Muscle weakness (generalized): Secondary | ICD-10-CM | POA: Diagnosis not present

## 2018-11-05 DIAGNOSIS — J479 Bronchiectasis, uncomplicated: Secondary | ICD-10-CM | POA: Diagnosis not present

## 2018-11-06 DIAGNOSIS — R262 Difficulty in walking, not elsewhere classified: Secondary | ICD-10-CM | POA: Diagnosis not present

## 2018-11-06 DIAGNOSIS — J479 Bronchiectasis, uncomplicated: Secondary | ICD-10-CM | POA: Diagnosis not present

## 2018-11-06 DIAGNOSIS — M6281 Muscle weakness (generalized): Secondary | ICD-10-CM | POA: Diagnosis not present

## 2018-11-06 DIAGNOSIS — R2681 Unsteadiness on feet: Secondary | ICD-10-CM | POA: Diagnosis not present

## 2018-11-09 DIAGNOSIS — R2681 Unsteadiness on feet: Secondary | ICD-10-CM | POA: Diagnosis not present

## 2018-11-09 DIAGNOSIS — R262 Difficulty in walking, not elsewhere classified: Secondary | ICD-10-CM | POA: Diagnosis not present

## 2018-11-09 DIAGNOSIS — J479 Bronchiectasis, uncomplicated: Secondary | ICD-10-CM | POA: Diagnosis not present

## 2018-11-09 DIAGNOSIS — M6281 Muscle weakness (generalized): Secondary | ICD-10-CM | POA: Diagnosis not present

## 2018-11-12 ENCOUNTER — Other Ambulatory Visit: Payer: Medicare Other

## 2018-11-12 ENCOUNTER — Other Ambulatory Visit: Payer: Self-pay

## 2018-11-12 DIAGNOSIS — R262 Difficulty in walking, not elsewhere classified: Secondary | ICD-10-CM | POA: Diagnosis not present

## 2018-11-12 DIAGNOSIS — Z515 Encounter for palliative care: Secondary | ICD-10-CM

## 2018-11-12 DIAGNOSIS — J479 Bronchiectasis, uncomplicated: Secondary | ICD-10-CM | POA: Diagnosis not present

## 2018-11-12 DIAGNOSIS — R2681 Unsteadiness on feet: Secondary | ICD-10-CM | POA: Diagnosis not present

## 2018-11-12 DIAGNOSIS — M6281 Muscle weakness (generalized): Secondary | ICD-10-CM | POA: Diagnosis not present

## 2018-11-12 NOTE — Progress Notes (Signed)
PATIENT NAME: Jo Booze DOB: 12-03-1921 MRN: [id number]  PRIMARY CARE PROVIDER: [name], MD  RESPONSIBLE PARTY:  Acct ID - Guarantor Home Phone Work Phone Relationship Acct Type  [id number] - [place],[name]  Self P/F     [place], [place], [place] [address]    PLAN OF CARE and INTERVENTIONS:               1.  GOALS OF CARE/ ADVANCE CARE PLANNING:  Remain at [place] living apartment at [facility].               2.  PATIENT/CAREGIVER EDUCATION:  Education on fall precautions, education on s/s of cellulitis, support.                4. PERSONAL EMERGENCY PLAN:  Patient resides at independent living apartment and has pendant to call for assist if needed.                5.  DISEASE STATUS:SW and RN made joint home care visit. Patient sitting on sofa in her apartment at [facility]. Patient denies having any pain at the present time but reports she has frequent headaches. Patient has order to take extra Xanax as needed for panic attacks and if she feels anxious. Patient suffered a fall approximately 3 weeks ago and is currently receiving PT. Patient reports she had physical therapy this morning and feels tired today. Patient also reports she had episode of confusion and remembers feeling confused.  Patient reports she didn't know the difference between her phone and remote.  Patient reports physical therapist felt this was related to dehydration. Patient reports she never feels hungry or thirsty. Patient reports her current weight is 136 pounds. Patient's heart rate slightly elevated today ranging between 100 and 102 BPM. Patient sounds congested and reports she has a productive cough of frothy sputum. Patient has scattered rhonchi throughout lungs. Patient has had cellulitis in the past and lower extremities are discolored without weeping or warmth.  Patiens lower extremities are cool to touch and patient states she needs to wear TED hose but hose are hard to  put on.   Patient asked if her legs need to be wrapped.  Education to patient since her legs are not weeping nurse does not feel legs need to be wrapped. Nurse did make recommendation for patient to elevate lower extremities. Patient has MD appointment to see cardiologist next month. Patient received her flu shot in September. Patient has difficulty with word recall and is aware that she has difficulty finding words. Patient talkative and pleasant at visit. Patient informed to call with questions or concerns. Patient talks about her family and reports family is supportive. Employee from facility arrived at patient's room to administer patients afternoon medicines prior to palliative care teams departure.      HISTORY OF PRESENT ILLNESS:    CODE STATUS: DNR  ADVANCED DIRECTIVES: Y MOST FORM: No PPS: 50%   PHYSICAL EXAM:   VITALS: Today's Vitals   11/12/18 1405  BP: 138/72  Pulse: 100  Resp: 18  Temp: 97.8 F (36.6 C)  TempSrc: Temporal  SpO2: 94%  Weight: 136 lb (61.7 kg)  Height: 5\' 9"  (1.753 m)  PainSc: 0-No pain    LUNGS: positive findings: rhonchi right upper posterior CARDIAC: Cor Tachy  EXTREMITIES: 1+ and pitting edema SKIN: Skin color, texture, turgor normal. No rashes or lesions or erythema - lower leg(s) bilateral  NEURO: positive for gait problems and speech problems  Annalise Mcdiarmid, RN 

## 2018-11-12 NOTE — Progress Notes (Signed)
COMMUNITY PALLIATIVE CARE SW NOTE  PATIENT NAME: Sarah [name] DOB: 02-Nov-1921 MRN: [id number]  PRIMARY CARE PROVIDER: [name], MD  RESPONSIBLE PARTY:  Acct ID - Guarantor Home Phone Work Phone Relationship Acct Type  [id number] - Mouser,[name]  Self P/F     [place], [place], [place] [address]     PLAN OF CARE and INTERVENTIONS:             1. GOALS OF CARE/ ADVANCE CARE PLANNING:  Patient is DNR, form is in the home. HCPOA is patient's daughters. [place] and maintain her independence. 2. SOCIAL/EMOTIONAL/SPIRITUAL ASSESSMENT/ INTERVENTIONS:  SW and RN met with patient in her home. Patient lives in [place], seems to be improving overall. Patient denies pain at this time. Patient discussed her recent fall about three weeks ago and occasional headaches. Patient expressed difficulty with recalling certain words. Patient said that she is going to the dining hall a few days a week and enjoys this. 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 Patient has two daughters that are local and supportive. Patient talked about her family and her grandchildren. Heather, staff member at [facility] came to assist with medications as team was leaving. SW provided emotional support, discussed goals and used active and reflective listening. 3. PATIENT/CAREGIVER EDUCATION/ COPING:  Patient was alert, forgetful. Patient was pleasant, engaged in conversation. Patient expressed her feelings openly. Patient noted her supportive family. Denies concerns and appreciates palliative care support. 4. PERSONAL EMERGENCY PLAN:  Patient wears an emergency pendant to alert staff. Facility will follow protocol.  5. COMMUNITY RESOURCES COORDINATION/ HEALTH CARE NAVIGATION:  Patient is receiving PT with [facility]. Patient keeps record of her symptoms in a notebook to discuss with MD. Patient has appointment with cardiologist  on 11/4. Family helps transport. 6. FINANCIAL/LEGAL CONCERNS/INTERVENTIONS:  None.     SOCIAL HX:  Social History   Tobacco Use  . Smoking status: Never Smoker  . Smokeless tobacco: Never Used  Substance Use Topics  . Alcohol use: Yes    Alcohol/week: 2.0 standard drinks    Types: 2 Glasses of wine per week    Comment: wine 1-2 week    CODE STATUS:   Code Status: Prior (DNR) ADVANCED DIRECTIVES: Y MOST FORM COMPLETE:  No. HOSPICE EDUCATION PROVIDED: None.  PPS: Patient ambulates with walker. Patient is mostly independent of ADLs.  I spent 45 minutes with patient/family, from 1:30-2:15p providing education, support and consultation.    Margaretmary Lombard, LCSW

## 2018-11-13 ENCOUNTER — Telehealth: Payer: Self-pay | Admitting: Cardiovascular Disease

## 2018-11-13 DIAGNOSIS — F411 Generalized anxiety disorder: Secondary | ICD-10-CM | POA: Diagnosis not present

## 2018-11-13 DIAGNOSIS — R519 Headache, unspecified: Secondary | ICD-10-CM | POA: Diagnosis not present

## 2018-11-13 DIAGNOSIS — H9193 Unspecified hearing loss, bilateral: Secondary | ICD-10-CM | POA: Diagnosis not present

## 2018-11-13 DIAGNOSIS — R7303 Prediabetes: Secondary | ICD-10-CM | POA: Diagnosis not present

## 2018-11-13 DIAGNOSIS — D508 Other iron deficiency anemias: Secondary | ICD-10-CM | POA: Diagnosis not present

## 2018-11-13 DIAGNOSIS — D539 Nutritional anemia, unspecified: Secondary | ICD-10-CM | POA: Diagnosis not present

## 2018-11-13 DIAGNOSIS — D509 Iron deficiency anemia, unspecified: Secondary | ICD-10-CM | POA: Diagnosis not present

## 2018-11-13 DIAGNOSIS — Z79899 Other long term (current) drug therapy: Secondary | ICD-10-CM | POA: Diagnosis not present

## 2018-11-13 DIAGNOSIS — R600 Localized edema: Secondary | ICD-10-CM | POA: Diagnosis not present

## 2018-11-13 DIAGNOSIS — K0510 Chronic gingivitis, plaque induced: Secondary | ICD-10-CM | POA: Diagnosis not present

## 2018-11-13 DIAGNOSIS — H04123 Dry eye syndrome of bilateral lacrimal glands: Secondary | ICD-10-CM | POA: Diagnosis not present

## 2018-11-13 DIAGNOSIS — E039 Hypothyroidism, unspecified: Secondary | ICD-10-CM | POA: Diagnosis not present

## 2018-11-13 NOTE — Telephone Encounter (Signed)
New Message:      Daughter called and said she will need to come in with pt. Pt is 89 and need her help,also the pt does not hear well.

## 2018-11-13 NOTE — Telephone Encounter (Signed)
Agree with plan and this has been added to patient's appointment note

## 2018-11-16 ENCOUNTER — Other Ambulatory Visit: Payer: Self-pay | Admitting: Emergency Medicine

## 2018-11-17 ENCOUNTER — Non-Acute Institutional Stay: Payer: Medicare Other | Admitting: Hospice

## 2018-11-17 ENCOUNTER — Other Ambulatory Visit: Payer: Self-pay

## 2018-11-17 DIAGNOSIS — Z515 Encounter for palliative care: Secondary | ICD-10-CM

## 2018-11-17 DIAGNOSIS — R2681 Unsteadiness on feet: Secondary | ICD-10-CM | POA: Diagnosis not present

## 2018-11-17 DIAGNOSIS — R262 Difficulty in walking, not elsewhere classified: Secondary | ICD-10-CM | POA: Diagnosis not present

## 2018-11-17 DIAGNOSIS — M6281 Muscle weakness (generalized): Secondary | ICD-10-CM | POA: Diagnosis not present

## 2018-11-17 DIAGNOSIS — J479 Bronchiectasis, uncomplicated: Secondary | ICD-10-CM | POA: Diagnosis not present

## 2018-11-17 NOTE — Progress Notes (Addendum)
[facility] Pall[facility] Care Consult Note Telephone: [phone]  Fax: [phone]  PATIENT NAME: Sarah [name] DOB: 04/02/1921 MRN: [id number]  PRIMARY CARE PROVIDER:   [name], MD  REFERRING PROVIDER:  [name], MD [address] [place],  [place] [address]  RESPONSIBLE PARTY:  Acct ID - Guarantor Home Phone Work Phone Relationship Acct Type  [id number] ODESSER, [phone]  Self P/F     [address] [place],  [address]   Daughter - Fortino Sic [phone] Daughter Irish Elders [phone]   TELEHEALTH VISIT STATEMENT Due to the COVID-19 crisis, this visit was done via telephone from my office. It was initiated and consented to by this patient and/or family. RECOMMENDATIONS/PLAN:   Advance Care Planning/Goals of Care: Telehealth visit consisted of building trust and discussions on patient's plan of care and wellbeing,  with patient, [facility] staff Nira Conn and daughter [name]. Fax message was received from Dr Ivin Booty concerning compression hose for patient: obst relief 20-46mHg. Spoke with MJana Halfwho acknowledged the plan for the hose; she said patient will need orientation/help on how to use the hose. She also  explained a well coordinated family's visit schedule and care for patient on a weekly basis. Therapeutic listening and validation provided. Goals of care include to maximize quality of life and symptom management. Patient remains a DNR; continues to reside in [place] pendant to call for assist if needed. Symptom management: Edema. Encouraged ongoing elevation. PCP ordered jobst relief 20-326mg. Discussed with [facility] staff who said they can apply the hose in the morning and take it off at night when they bring her medications. This was confirmed by TiOtila Kluverpatient coordinator who called later. Patient was satisfied with plan and said her daughter organized the hose for her and she is  looking forward to wearing the hose as ordered. She denied pain/discomfort/difficulty breathing; pleasant on the phone, able to communicate meaningfully, in no acute distress.  Follow up: Palliative care will continue to follow patient for goals of care clarification and symptom management I spent 25  minutes providing this consultation, from 11.30am to 11.55am. More than 50% of the time in this consultation was spent on coordinating communication. HISTORY OF PRESENT ILLNESS:  Sarah [name] a [age].o. year old female with multiple medical problems including Diastolic dysfunction, HTN Anxiety, Asthma. Palliative Care was asked to help address goals of care.   CODE STATUS: DNR  PPS: 40% HOSPICE ELIGIBILITY/DIAGNOSIS: TBD  PAST MEDICAL HISTORY:  Past Medical History:  Diagnosis Date   Allergic rhinitis    Anxiety    Anxiety attacks   Arthritis    "a little; in my back and hands" (04/15/2012)   Asthma    Bronchiectasis    with history of Legionaires with chronic rales/rhonchi   Chest pain at rest    Daily headache    "over the last week" 04/15/2012    Depression    Diastolic dysfunction    a. per echo 08/2010 with normal LV function;  b. 06/2011 Echo: EF 65-70%   GERD (gastroesophageal reflux disease)    "just recently" (04/15/2012)   H/O hiatal hernia    H/O Legionnaire's disease 11/1976   History of blood transfusion 1972   "w/hysterectomy" (04/15/2012)   Hypercholesteremia    Hypertension    Negative renal duplex in December of 2012   Hypothyroidism    Idiopathic peripheral neuropathy    MAC (mycobacterium avium-intracellulare complex)    Migraines    "  outgrew them" (04/15/2012)   NSTEMI (non-ST elevated myocardial infarction) ([place])    a. Normal coronaries per cath August 2012 and negative CT angio for PE;  b. 06/2011 Repeat admission w/ chest pain and elevated troponin's, CTA Chest w/o PE   Osteopenia 02/2011   t score - 2.1   Pneumonia    "recurrent"  (04/15/2012)   Pollen allergies    Shortness of breath    "sometimes just lying down" (04/15/2012)   Stroke ([place]) 06/2014   cerebellum    SOCIAL HX:  Social History   Tobacco Use   Smoking status: Never Smoker   Smokeless tobacco: Never Used  Substance Use Topics   Alcohol use: Yes    Alcohol/week: 2.0 standard drinks    Types: 2 Glasses of wine per week    Comment: wine 1-2 week    ALLERGIES:  Allergies  Allergen Reactions   Codeine Anaphylaxis   Hydrocodone Nausea And Vomiting and Other (See Comments)    SYNCOPE AND BRADYCARDIA   Isosorbide Nitrate Other (See Comments)    BRADYCARDIA   Oxycodone Palpitations    Rapid heart beat   Clarithromycin Nausea And Vomiting    Has patient had a PCN reaction causing immediate rash, facial/tongue/throat swelling, SOB or lightheadedness with hypotension: Unknown Has patient had a PCN reaction causing severe rash involving mucus membranes or skin necrosis: Unknown Has patient had a PCN reaction that required hospitalization: Unknown Has patient had a PCN reaction occurring within the last 10 years: Unknown If all of the above answers are "NO", then may proceed with Cephalosporin use.    Fiorinal [Butalbital-Aspirin-Caffeine] Swelling and Other (See Comments)    Facial swelling    Levofloxacin Nausea And Vomiting and Other (See Comments)    SYNCOPE ALSO   Augmentin [Amoxicillin-Pot Clavulanate] Diarrhea   Doxycycline     Sweats and aches     PERTINENT MEDICATIONS:  Outpatient Encounter Medications as of 11/17/2018  Medication Sig   albuterol (PROVENTIL HFA;VENTOLIN HFA) 108 (90 Base) MCG/ACT inhaler Inhale 1-2 puffs into the lungs every 6 (six) hours as needed for wheezing or shortness of breath.   albuterol (PROVENTIL) (2.5 MG/3ML) 0.083% nebulizer solution USE 1 VIAL VIA NEBULIZER TWICE DAILY   ALPRAZolam (XANAX) 0.5 MG tablet Take 1 tablet by mouth 3 (three) times daily.   azithromycin (ZITHROMAX) 250 MG tablet  TAKE 2 TABLETS BY MOUTH TODAY THEN TAKE 1 TABLET DAILY FOR 4 DAYS   calcium carbonate (TUMS - DOSED IN MG ELEMENTAL CALCIUM) 500 MG chewable tablet Chew 1 tablet by mouth every 8 (eight) hours as needed for indigestion or heartburn.   carvedilol (COREG) 6.25 MG tablet TAKE 1 TABLET(6.25 MG) BY MOUTH TWICE DAILY   cefdinir (OMNICEF) 300 MG capsule TAKE ONE CAPSULE BY MOUTH TWICE DAILY   cephALEXin (KEFLEX) 500 MG capsule Take 1 capsule (500 mg total) by mouth 4 (four) times daily.   clopidogrel (PLAVIX) 75 MG tablet TAKE 1 TABLET(75 MG) BY MOUTH DAILY   DULoxetine (CYMBALTA) 20 MG capsule Take 20 mg by mouth 3 (three) times daily.   escitalopram (LEXAPRO) 5 MG tablet Take 2.5 mg by mouth daily.   gabapentin (NEURONTIN) 100 MG capsule Take 300 mg by mouth 3 (three) times daily.    latanoprost (XALATAN) 0.005 % ophthalmic solution Place 1 drop into both eyes at bedtime.    levothyroxine (SYNTHROID, LEVOTHROID) 50 MCG tablet Take 50 mcg by mouth daily.     loratadine (CLARITIN) 10 MG tablet Take 10 mg  by mouth daily.   MULTIPLE VITAMIN PO Take 1 tablet by mouth daily.   mupirocin ointment (BACTROBAN) 2 % Apply to toes daily   pantoprazole (PROTONIX) 40 MG tablet Take 40 mg by mouth 2 (two) times daily.   predniSONE (DELTASONE) 10 MG tablet TAKE 1 TABLET(10 MG) BY MOUTH DAILY WITH BREAKFAST   Probiotic Product (PROBIOTIC ADVANCED PO) Take 1 tablet by mouth daily.   Sennosides (SENOKOT PO) Take 1 tablet by mouth as needed.    sulfamethoxazole-trimethoprim (BACTRIM DS) 800-160 MG tablet    VITAMIN D, CHOLECALCIFEROL, PO Take 2 tablets by mouth daily.    No facility-administered encounter medications on file as of 11/17/2018.      Teodoro Spray, NP

## 2018-11-18 ENCOUNTER — Encounter: Payer: Self-pay | Admitting: Cardiovascular Disease

## 2018-11-18 ENCOUNTER — Ambulatory Visit (INDEPENDENT_AMBULATORY_CARE_PROVIDER_SITE_OTHER): Payer: Medicare Other | Admitting: Cardiovascular Disease

## 2018-11-18 ENCOUNTER — Other Ambulatory Visit: Payer: Self-pay

## 2018-11-18 VITALS — BP 118/76 | HR 93 | Ht 66.0 in | Wt 124.8 lb

## 2018-11-18 DIAGNOSIS — I10 Essential (primary) hypertension: Secondary | ICD-10-CM | POA: Diagnosis not present

## 2018-11-18 DIAGNOSIS — I2510 Atherosclerotic heart disease of native coronary artery without angina pectoris: Secondary | ICD-10-CM

## 2018-11-18 DIAGNOSIS — I5032 Chronic diastolic (congestive) heart failure: Secondary | ICD-10-CM | POA: Diagnosis not present

## 2018-11-18 NOTE — Progress Notes (Signed)
Sarah [name] Date of Birth: Jun 25, 1921 Medical Record #[id number]  Problem list: 1. Hypertension 2. Asthma 3. Depression 4. Coronary artery disease-status post non-ST segment elevation myocardial infarction. Cardiac cath revealed nonobstructive coronary artery disease 5. Bronchiectasis / atypical mycobacterium infection    Sarah [name] is seen back today following a recent hospitalization. She was admitted to the hospital with chest tightness. She ruled out for myocardial infarction. She has a history of chest pain in the past and a cardiac catheterization in 2012 was unremarkable.  She originally called EMS when her BP was 220/110  She has had significant difficulty controlling her BP recently.   She has been watching her salt.  She has taken amlodipine and Lisinopril in the past but these have been stopped because she seemed to be overmedicated.    She continues to have intermittent episodes of chest pressure and heaviness.  These are typically associated with marked hypertension although it is not clear whether the hypertension occurs first or as a result of her  feeling poorly.  Jun 11, 2012:  Sarah [name] was seen by Truitt Merle in February, 2014. She had been measuring her blood pressure at least 10 times every day and was worried because her blood pressure seemed to be somewhat labile.  She feels poorly today.  She did not sleep well last night.  She had a headache.   She has lots of anxiety issues.  She takes xanax with some relief.    June 2 , 2015:  Sarah [name] is doing ok.  Upset about having to put her husband in an memory care SNF.  Lots of guilt about that.    Has a home sitter with her today. No significant CP or dyspnea.  She has some anxiety - has attacks on occasion.  Her anxiety seems to start in her chest as a CP.  If she takes the xanax and Tylenol #3 in time, she can avoid the panic attacks.    She thinks she had 3 TIAs this past year.  Carotid duplex was negative.   MRI was normal.    She has been on 1 ASA 325 mg a day.   Jan. 31, 2017:  Patient is ding well.   BP and HR look great today Still short of breath .  Has pulmonary issues.   Sees [name] .   Jan. 30, 2018:    Doing well from a cardiac .    Just getting over an episode of cellulitis and lymphedema .  No further CP   BP has been a little high.  She thinks she may have had a TIA on Saturday .    She felt strange in her head,  Could not speak,  Had double vision,  Took her BP - 199/110  May 27, 2017:  Sarah [name] is seen today for follow-up visit.  She has a history of coronary artery disease. She had a TIA earlier this year.  Echocardiogram in January, 2019 reveals a vigorous left ventricular systolic function with EF of 65 to 70%.  She has grade 1 diastolic dysfunction.  No CP . Has COPD  HR is a little fast but over all is doing well   Nov. 4, 2020 :  Seen with Vira Blanco ( daughter )  Sarah [name] is seen today for follow-up of her coronary artery disease.  She has a history of myocardial infarction.  She had a non-ST segment elevation myocardial infarction many years ago.  Heart catheterization  revealed nonobstructive coronary artery disease.  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  We have made some med changes Was stopped Losartan and  HCTZ due to low BP  At a previous telemedicine visit  She has felt better since we stopped the Losartan and HCTZ   BP has been variable  Sarah [name] is almost always [name] in dietary intake of salt.  She lives in an assisted living situation and does not have any control of her salt intake.  We discussed that she can take an extra 1/2  carvedilol if her blood pressure becomes markedly elevated assuming that her heart rate is fast enough.   Current Outpatient Medications on File Prior to Visit  Medication Sig Dispense Refill  . albuterol (PROVENTIL HFA;VENTOLIN HFA) 108  (90 Base) MCG/ACT inhaler Inhale 1-2 puffs into the lungs every 6 (six) hours as needed for wheezing or shortness of breath. 1 Inhaler 6  . albuterol (PROVENTIL) (2.5 MG/3ML) 0.083% nebulizer solution USE 1 VIAL VIA NEBULIZER TWICE DAILY 720 mL 3  . ALPRAZolam (XANAX) 0.5 MG tablet Take 1 tablet by mouth 3 (three) times daily.    Marland Kitchen azithromycin (ZITHROMAX) 250 MG tablet TAKE 2 TABLETS BY MOUTH TODAY THEN TAKE 1 TABLET DAILY FOR 4 DAYS 6 tablet 2  . calcium carbonate (TUMS - DOSED IN MG ELEMENTAL CALCIUM) 500 MG chewable tablet Chew 1 tablet by mouth every 8 (eight) hours as needed for indigestion or heartburn.    . carvedilol (COREG) 6.25 MG tablet TAKE 1 TABLET(6.25 MG) BY MOUTH TWICE DAILY 180 tablet 2  . cefdinir (OMNICEF) 300 MG capsule TAKE ONE CAPSULE BY MOUTH TWICE DAILY 60 capsule 2  . cephALEXin (KEFLEX) 500 MG capsule Take 1 capsule (500 mg total) by mouth 4 (four) times daily. 28 capsule 0  . clopidogrel (PLAVIX) 75 MG tablet TAKE 1 TABLET(75 MG) BY MOUTH DAILY 30 tablet 7  . DULoxetine (CYMBALTA) 20 MG capsule Take 20 mg by mouth 3 (three) times daily.    Marland Kitchen escitalopram (LEXAPRO) 5 MG tablet Take 2.5 mg by mouth daily.    Marland Kitchen gabapentin (NEURONTIN) 100 MG capsule Take 300 mg by mouth 3 (three) times daily.     Marland Kitchen latanoprost (XALATAN) 0.005 % ophthalmic solution Place 1 drop into both eyes at bedtime.     Marland Kitchen levothyroxine (SYNTHROID, LEVOTHROID) 50 MCG tablet Take 50 mcg by mouth daily.      Marland Kitchen loratadine (CLARITIN) 10 MG tablet Take 10 mg by mouth daily.    . MULTIPLE VITAMIN PO Take 1 tablet by mouth daily.    . mupirocin ointment (BACTROBAN) 2 % Apply to toes daily 22 g 0  . pantoprazole (PROTONIX) 40 MG tablet Take 40 mg by mouth 2 (two) times daily.    . predniSONE (DELTASONE) 10 MG tablet TAKE 1 TABLET(10 MG) BY MOUTH DAILY WITH BREAKFAST 30 tablet 5  . Probiotic Product (PROBIOTIC ADVANCED PO) Take 1 tablet by mouth daily.    . Sennosides (SENOKOT PO) Take 1 tablet by mouth as  needed.     . sulfamethoxazole-trimethoprim (BACTRIM DS) 800-160 MG tablet     . VITAMIN D, CHOLECALCIFEROL, PO Take 2 tablets by mouth daily.      No current facility-administered medications on file prior to visit.     Allergies  Allergen Reactions  . Codeine Anaphylaxis  . Hydrocodone Nausea And Vomiting and Other (See Comments)    SYNCOPE AND BRADYCARDIA  .  Isosorbide Nitrate Other (See Comments)    BRADYCARDIA  . Oxycodone Palpitations    Rapid heart beat  . Clarithromycin Nausea And Vomiting    Has patient had a PCN reaction causing immediate rash, facial/tongue/throat swelling, SOB or lightheadedness with hypotension: Unknown Has patient had a PCN reaction causing severe rash involving mucus membranes or skin necrosis: Unknown Has patient had a PCN reaction that required hospitalization: Unknown Has patient had a PCN reaction occurring within the last 10 years: Unknown If all of the above answers are "NO", then may proceed with Cephalosporin use.   . Fiorinal [Butalbital-Aspirin-Caffeine] Swelling and Other (See Comments)    Facial swelling   . Levofloxacin Nausea And Vomiting and Other (See Comments)    SYNCOPE ALSO  . Augmentin [Amoxicillin-Pot Clavulanate] Diarrhea  . Doxycycline     Sweats and aches    Past Medical History:  Diagnosis Date  . Allergic rhinitis   . Anxiety    Anxiety attacks  . Arthritis    "a little; in my back and hands" (04/15/2012)  . Asthma   . Bronchiectasis    with history of Legionaires with chronic rales/rhonchi  . Chest pain at rest   . Daily headache    "over the last week" 04/15/2012   . Depression   . Diastolic dysfunction    a. per echo 08/2010 with normal LV function;  b. 06/2011 Echo: EF 65-70%  . GERD (gastroesophageal reflux disease)    "just recently" (04/15/2012)  . H/O hiatal hernia   . H/O Legionnaire's disease 11/1976  . History of blood transfusion 1972   "w/hysterectomy" (04/15/2012)  . Hypercholesteremia   .  Hypertension    Negative renal duplex in December of 2012  . Hypothyroidism   . Idiopathic peripheral neuropathy   . MAC (mycobacterium avium-intracellulare complex)   . Migraines    "outgrew them" (04/15/2012)  . NSTEMI (non-ST elevated myocardial infarction) ([place])    a. Normal coronaries per cath August 2012 and negative CT angio for PE;  b. 06/2011 Repeat admission w/ chest pain and elevated troponin's, CTA Chest w/o PE  . Osteopenia 02/2011   t score - 2.1  . Pneumonia    "recurrent" (04/15/2012)  . Pollen allergies   . Shortness of breath    "sometimes just lying down" (04/15/2012)  . Stroke [facility]) 06/2014   cerebellum    Past Surgical History:  Procedure Laterality Date  . APPENDECTOMY    . CARDIAC CATHETERIZATION  August 2012   Normal coronaries.  Marland Kitchen DILATION AND CURETTAGE OF UTERUS    . TONSILLECTOMY  1930  . TOTAL ABDOMINAL HYSTERECTOMY W/ BILATERAL SALPINGOOPHORECTOMY  1972   leiomyomata, menorrhagia    Social History   Tobacco Use  Smoking Status Never Smoker  Smokeless Tobacco Never Used    Social History   Substance and Sexual Activity  Alcohol Use Yes  . Alcohol/week: 2.0 standard drinks  . Types: 2 Glasses of wine per week   Comment: wine 1-2 week    Family History  Problem Relation Age of Onset  . Hypertension Mother        stroke  . Stroke Mother   . Cancer Brother        prostate  . Osteoarthritis Paternal Aunt     Review of Systems: The review of systems is per the HPI.  All other systems were reviewed and are negative.   Physical Exam: Last menstrual period 01/14/1970.  GEN: Chronically ill-appearing elderly female, HEENT: Normal NECK:  No JVD; No carotid bruits LYMPHATICS: No lymphadenopathy CARDIAC: RRR   RESPIRATORY:   Chronic   chronic coarse rales and wheezes in her bases.   ABDOMEN: Soft, non-tender, non-distended MUSCULOSKELETAL:  No edema; No deformity  SKIN: Warm and dry NEUROLOGIC:  Alert and oriented x 3    LABORATORY  DATA:  Lab Results  Component Value Date   WBC 12.3 (H) 03/16/2018   HGB 10.5 (L) 03/16/2018   HCT 33.5 (L) 03/16/2018   PLT 336.0 03/16/2018   GLUCOSE 124 (H) 03/16/2018   CHOL 179 07/09/2014   TRIG 66 07/09/2014   HDL 67 07/09/2014   LDLCALC 99 07/09/2014   ALT 10 (L) 01/26/2017   AST 23 01/26/2017   NA 133 (L) 03/16/2018   K 4.1 03/16/2018   CL 94 (L) 03/16/2018   CREATININE 0.87 03/16/2018   BUN 14 03/16/2018   CO2 31 03/16/2018   TSH 0.522 01/26/2017   INR 1.10 07/08/2014   HGBA1C 6.0 (H) 07/09/2014   ECG: November 18, 2018: Normal sinus rhythm at 93.  Nonspecific T wave abnormality.    Assessment / Plan:  1. Hypertension-blood pressure seems to be fairly well controlled.  She does have some occasional elevated blood pressures which are likely due to dietary variances at the assisted living.  Given her daughter the okay to give her a one half or a whole extra carvedilol if her systolic blood pressures greater than 180  as long as her heart rate is above 70.   2. Possible TIA :   Continue Plavix.  3. Depression  4. Coronary artery disease-status post non-ST segment elevation myocardial infarction. Cardiac cath revealed nonobstructive coronary artery disease, Continue Plavix.  5. Bronchiectasis / atypical mycobacterium infection  - stable exam .  She takes alternating abx monthly     Mertie Moores, MD  11/18/2018 7:51 AM    [address] [place],  [address] [place], [place]  [address] Pager [phone] Phone: [phone]; Fax: [phone]

## 2018-11-18 NOTE — Patient Instructions (Signed)
Medication Instructions:  Your physician recommends that you continue on your current medications as directed. Please refer to the Current Medication list given to you today.  *If you need a refill on your cardiac medications before your next appointment, please call your pharmacy*   Lab Work: None Ordered   Testing/Procedures: None Ordered   Follow-Up: At [facility], you and your health needs are our priority.  As part of our continuing mission to provide you with exceptional heart care, we have created designated Provider Care Teams.  These Care Teams include your primary Cardiologist (physician) and Advanced Practice Providers (APPs -  Physician Assistants and Nurse Practitioners) who all work together to provide you with the care you need, when you need it.  Your next appointment:   6 month(s)  The format for your next appointment:   Either In Person or Virtual  Provider:   You may see Philip Nahser, MD or one of the following Advanced Practice Providers on your designated Care Team:    Scott Weaver, PA-C  Vin Bhagat, PA-C  Janine Hammond, NP    

## 2018-11-19 DIAGNOSIS — M6281 Muscle weakness (generalized): Secondary | ICD-10-CM | POA: Diagnosis not present

## 2018-11-19 DIAGNOSIS — R262 Difficulty in walking, not elsewhere classified: Secondary | ICD-10-CM | POA: Diagnosis not present

## 2018-11-19 DIAGNOSIS — R2681 Unsteadiness on feet: Secondary | ICD-10-CM | POA: Diagnosis not present

## 2018-11-19 DIAGNOSIS — J479 Bronchiectasis, uncomplicated: Secondary | ICD-10-CM | POA: Diagnosis not present

## 2018-11-23 DIAGNOSIS — J479 Bronchiectasis, uncomplicated: Secondary | ICD-10-CM | POA: Diagnosis not present

## 2018-11-23 DIAGNOSIS — M6281 Muscle weakness (generalized): Secondary | ICD-10-CM | POA: Diagnosis not present

## 2018-11-23 DIAGNOSIS — R2681 Unsteadiness on feet: Secondary | ICD-10-CM | POA: Diagnosis not present

## 2018-11-23 DIAGNOSIS — R262 Difficulty in walking, not elsewhere classified: Secondary | ICD-10-CM | POA: Diagnosis not present

## 2018-11-24 ENCOUNTER — Ambulatory Visit (INDEPENDENT_AMBULATORY_CARE_PROVIDER_SITE_OTHER): Payer: Medicare Other | Admitting: Podiatry

## 2018-11-24 ENCOUNTER — Encounter: Payer: Self-pay | Admitting: Podiatry

## 2018-11-24 ENCOUNTER — Other Ambulatory Visit: Payer: Self-pay

## 2018-11-24 DIAGNOSIS — Z9229 Personal history of other drug therapy: Secondary | ICD-10-CM

## 2018-11-24 DIAGNOSIS — B351 Tinea unguium: Secondary | ICD-10-CM

## 2018-11-24 DIAGNOSIS — M79674 Pain in right toe(s): Secondary | ICD-10-CM

## 2018-11-24 DIAGNOSIS — L84 Corns and callosities: Secondary | ICD-10-CM

## 2018-11-24 DIAGNOSIS — M79675 Pain in left toe(s): Secondary | ICD-10-CM

## 2018-11-24 NOTE — Patient Instructions (Signed)

## 2018-11-25 ENCOUNTER — Telehealth: Payer: Self-pay

## 2018-11-25 DIAGNOSIS — H353122 Nonexudative age-related macular degeneration, left eye, intermediate dry stage: Secondary | ICD-10-CM | POA: Diagnosis not present

## 2018-11-25 DIAGNOSIS — H353213 Exudative age-related macular degeneration, right eye, with inactive scar: Secondary | ICD-10-CM | POA: Diagnosis not present

## 2018-11-25 DIAGNOSIS — H401131 Primary open-angle glaucoma, bilateral, mild stage: Secondary | ICD-10-CM | POA: Diagnosis not present

## 2018-11-25 DIAGNOSIS — H43813 Vitreous degeneration, bilateral: Secondary | ICD-10-CM | POA: Diagnosis not present

## 2018-11-25 NOTE — Telephone Encounter (Signed)
RN placed call to dr. Cheron Schaumann Office and left message for [place]. Sandy had called Doran Clay with palliative care requesting call back from RN. Lovey Newcomer wanting clarification regarding order for some type of hose for patient. Nurse left message requesting call back.

## 2018-11-26 DIAGNOSIS — R262 Difficulty in walking, not elsewhere classified: Secondary | ICD-10-CM | POA: Diagnosis not present

## 2018-11-26 DIAGNOSIS — R2681 Unsteadiness on feet: Secondary | ICD-10-CM | POA: Diagnosis not present

## 2018-11-26 DIAGNOSIS — J479 Bronchiectasis, uncomplicated: Secondary | ICD-10-CM | POA: Diagnosis not present

## 2018-11-26 DIAGNOSIS — M6281 Muscle weakness (generalized): Secondary | ICD-10-CM | POA: Diagnosis not present

## 2018-11-29 NOTE — Progress Notes (Signed)
Subjective: Sarah [name] presents to clinic with cc of painful mycotic toenails and corns of both feet  which are aggravated when weightbearing with and without shoe gear.  This pain limits her daily activities. Pain symptoms resolve with periodic professional debridement.  She is present with her caregiver on today's visit.   She remains on blood thinner, Plavix. She also has h/o idiopathic peripheral neuropathy.  Current Outpatient Medications on File Prior to Visit  Medication Sig Dispense Refill  . albuterol (PROVENTIL) (2.5 MG/3ML) 0.083% nebulizer solution USE 1 VIAL VIA NEBULIZER TWICE DAILY 720 mL 3  . ALPRAZolam (XANAX) 0.5 MG tablet Take 1 tablet by mouth 3 (three) times daily.    Marland Kitchen azithromycin (ZITHROMAX) 250 MG tablet Take 250 mg by mouth daily. Every other month    . calcium carbonate (TUMS - DOSED IN MG ELEMENTAL CALCIUM) 500 MG chewable tablet Chew 1 tablet by mouth every 8 (eight) hours as needed for indigestion or heartburn.    . carvedilol (COREG) 6.25 MG tablet TAKE 1 TABLET(6.25 MG) BY MOUTH TWICE DAILY 180 tablet 2  . cefdinir (OMNICEF) 300 MG capsule Take 300 mg by mouth 2 (two) times daily. Every other month    . clopidogrel (PLAVIX) 75 MG tablet TAKE 1 TABLET(75 MG) BY MOUTH DAILY 30 tablet 7  . DULoxetine (CYMBALTA) 20 MG capsule Take 20 mg by mouth 3 (three) times daily.    Marland Kitchen escitalopram (LEXAPRO) 5 MG tablet Take 2.5 mg by mouth daily.    Marland Kitchen gabapentin (NEURONTIN) 100 MG capsule Take 300 mg by mouth 3 (three) times daily.     . Iron-FA-B Cmp-C-Biot-Probiotic (FUSION PLUS PO) Take 1 tablet by mouth daily.    Marland Kitchen latanoprost (XALATAN) 0.005 % ophthalmic solution Place 1 drop into both eyes at bedtime.     Marland Kitchen levothyroxine (SYNTHROID, LEVOTHROID) 50 MCG tablet Take 50 mcg by mouth daily.      . MULTIPLE VITAMIN PO Take 1 tablet by mouth daily.    . mupirocin ointment (BACTROBAN) 2 % Apply to toes daily 22 g 0  . pantoprazole (PROTONIX) 40 MG tablet Take 40 mg by mouth  2 (two) times daily.    . predniSONE (DELTASONE) 10 MG tablet TAKE 1 TABLET(10 MG) BY MOUTH DAILY WITH BREAKFAST 30 tablet 5  . Probiotic Product (PROBIOTIC ADVANCED PO) Take 1 tablet by mouth daily.    . Sennosides (SENOKOT PO) Take 1 tablet by mouth as needed.     Marland Kitchen VITAMIN D, CHOLECALCIFEROL, PO Take 2 tablets by mouth daily.      No current facility-administered medications on file prior to visit.      Allergies  Allergen Reactions  . Codeine Anaphylaxis  . Hydrocodone Nausea And Vomiting and Other (See Comments)    SYNCOPE AND BRADYCARDIA  . Isosorbide Nitrate Other (See Comments)    BRADYCARDIA  . Oxycodone Palpitations    Rapid heart beat  . Clarithromycin Nausea And Vomiting    Has patient had a PCN reaction causing immediate rash, facial/tongue/throat swelling, SOB or lightheadedness with hypotension: Unknown Has patient had a PCN reaction causing severe rash involving mucus membranes or skin necrosis: Unknown Has patient had a PCN reaction that required hospitalization: Unknown Has patient had a PCN reaction occurring within the last 10 years: Unknown If all of the above answers are "NO", then may proceed with Cephalosporin use.   . Fiorinal [Butalbital-Aspirin-Caffeine] Swelling and Other (See Comments)    Facial swelling   . Levofloxacin Nausea And Vomiting  and Other (See Comments)    SYNCOPE ALSO  . Augmentin [Amoxicillin-Pot Clavulanate] Diarrhea  . Doxycycline     Sweats and aches     Objective: There were no vitals filed for this visit.  Physical Examination:  Vascular  Examination: Capillary refill time <3 seconds b/l.   Palpable DP/PT pulses b/l.  Digital hair absent b/l.   Skin temperature gradient WNL b/l.  Dermatological Examination: Skin noted to be thin and atrophic b/l.  No open wounds b/l.  No interdigital macerations noted b/l.  Elongated, thick, discolored brittle toenails with subungual debris and pain on dorsal palpation of nailbeds  1-5 b/l.  Hyperkeratotic lesions dorsal 5th PIPJ right foot, right 3rd digit, dorsal PIPJ and distal tip of left 4th digit with tenderness to palpation. No edema, no erythema, no drainage, no flocculence.   Musculoskeletal Examination: Muscle strength 5/5 to all muscle groups b/l.  No pain, crepitus or joint discomfort with active/passive ROM.  Neurological Examination: Sensation intact 5/5 b/l with 10 gram monofilament.  Assessment: 1. Mycotic nail infection with pain 1-5 b/l 2. Corns right 5th digit, right 3rd digit and left 4th digit 3. Patient on long term blood thinner  Plan: 1. Toenails 1-5 b/l were debrided in length and girth without iatrogenic laceration.  2. Corns pared right 5th digit, right 3rd digit and left 4th digit utilizing sterile scalpel blade without incident. 3. Continue soft, supportive shoe gear daily. 4. Report any pedal injuries to medical professional. 5. Follow up 9 weeks. 6. Patient/POA to call should there be a question/concern in there interim.

## 2018-11-30 DIAGNOSIS — J479 Bronchiectasis, uncomplicated: Secondary | ICD-10-CM | POA: Diagnosis not present

## 2018-11-30 DIAGNOSIS — R2681 Unsteadiness on feet: Secondary | ICD-10-CM | POA: Diagnosis not present

## 2018-11-30 DIAGNOSIS — M6281 Muscle weakness (generalized): Secondary | ICD-10-CM | POA: Diagnosis not present

## 2018-11-30 DIAGNOSIS — R262 Difficulty in walking, not elsewhere classified: Secondary | ICD-10-CM | POA: Diagnosis not present

## 2018-12-02 DIAGNOSIS — R2681 Unsteadiness on feet: Secondary | ICD-10-CM | POA: Diagnosis not present

## 2018-12-02 DIAGNOSIS — J479 Bronchiectasis, uncomplicated: Secondary | ICD-10-CM | POA: Diagnosis not present

## 2018-12-02 DIAGNOSIS — M6281 Muscle weakness (generalized): Secondary | ICD-10-CM | POA: Diagnosis not present

## 2018-12-02 DIAGNOSIS — R262 Difficulty in walking, not elsewhere classified: Secondary | ICD-10-CM | POA: Diagnosis not present

## 2018-12-08 ENCOUNTER — Telehealth: Payer: Self-pay

## 2018-12-08 DIAGNOSIS — R2681 Unsteadiness on feet: Secondary | ICD-10-CM | POA: Diagnosis not present

## 2018-12-08 DIAGNOSIS — R262 Difficulty in walking, not elsewhere classified: Secondary | ICD-10-CM | POA: Diagnosis not present

## 2018-12-08 DIAGNOSIS — M6281 Muscle weakness (generalized): Secondary | ICD-10-CM | POA: Diagnosis not present

## 2018-12-08 DIAGNOSIS — J479 Bronchiectasis, uncomplicated: Secondary | ICD-10-CM | POA: Diagnosis not present

## 2018-12-08 NOTE — Telephone Encounter (Signed)
SW contacted [name] (patient's daughter). [name] provided brief update on patient. [name] notes that patient seems more confused. [name] said that patient is now taking Xanax BID to see if this was impacting her confusion. Patient has also started eye drops three times a day. Patient is continuing with PT at the facility. Patient has not received her compression hose that were ordered. Patient is putting on compression socks daily with help of staff. SW to follow-up on order for compression hose, and notified [name] (patient's daughter). [name] appreciative of phone call and support.

## 2018-12-09 DIAGNOSIS — R262 Difficulty in walking, not elsewhere classified: Secondary | ICD-10-CM | POA: Diagnosis not present

## 2018-12-09 DIAGNOSIS — M6281 Muscle weakness (generalized): Secondary | ICD-10-CM | POA: Diagnosis not present

## 2018-12-09 DIAGNOSIS — J479 Bronchiectasis, uncomplicated: Secondary | ICD-10-CM | POA: Diagnosis not present

## 2018-12-09 DIAGNOSIS — R2681 Unsteadiness on feet: Secondary | ICD-10-CM | POA: Diagnosis not present

## 2018-12-14 ENCOUNTER — Other Ambulatory Visit: Payer: Self-pay | Admitting: Emergency Medicine

## 2018-12-14 DIAGNOSIS — R262 Difficulty in walking, not elsewhere classified: Secondary | ICD-10-CM | POA: Diagnosis not present

## 2018-12-14 DIAGNOSIS — J479 Bronchiectasis, uncomplicated: Secondary | ICD-10-CM | POA: Diagnosis not present

## 2018-12-14 DIAGNOSIS — M6281 Muscle weakness (generalized): Secondary | ICD-10-CM | POA: Diagnosis not present

## 2018-12-14 DIAGNOSIS — R2681 Unsteadiness on feet: Secondary | ICD-10-CM | POA: Diagnosis not present

## 2018-12-15 ENCOUNTER — Telehealth: Payer: Self-pay

## 2018-12-15 ENCOUNTER — Telehealth: Payer: Self-pay | Admitting: Emergency Medicine

## 2018-12-15 MED ORDER — AZITHROMYCIN 250 MG PO TABS: 250.0000 mg | ORAL_TABLET | Freq: Every day | ORAL | 2 refills | Status: DC

## 2018-12-15 NOTE — Telephone Encounter (Signed)
Per [name] ok to be filled. I have spoken to patient's daughter([name]) and made her aware. Nothing further is needed.

## 2018-12-15 NOTE — Telephone Encounter (Signed)
Yes - Ok to refill Thanks

## 2018-12-15 NOTE — Telephone Encounter (Signed)
Patient's daughter is calling in requesting a refill on her Azithromycin. She states that she takes it every other month. And she is requesting a refill for the next month she will need to take it. Please advise.    Assessment & Plan Note by Collene Gobble, MD at 10/08/2018 2:05 PM Author: Collene Gobble, MD Author Type: Physician Filed: 10/08/2018 2:07 PM  Note Status: [place] List: Cosign Not Required Encounter Date: 10/08/2018  Problem: BRONCHIECTASIS  Editor: Collene Gobble, MD (Physician)  Prior Versions: 1. Collene Gobble, MD (Physician) at 10/08/2018 2:05 PM - Written    Overall improved and stable since last visit. Believe the risk / benefit still favors continuing her pred. I have encouraged her to restart chest vest so we won't get behind w secretion management  We will continue to alternate your antibiotics during the first week of each month: -Omnicef twice a day for 7 days -Azithromycin, 2 pills on the first day and then 1 pill for the next 4 days Please restart your chest vest therapy once daily Continue prednisone 10 mg once daily Continue to use your albuterol nebulizers as you need them.  It would probably be helpful for you to use at least once a day in conjunction with your chest vest to help clear your mucus more easily Flu shot today Follow with Dr Lamonte Sakai in 4 months or sooner if you have any problems.

## 2018-12-15 NOTE — Telephone Encounter (Signed)
Telephone call to daughter to schedule palliative care visit.  Daughter reports patient is having to see oral surgeon on 12-21-18 to have 3 teeth extracted.  Daughter asks if patient needs nursing visit this month due to increase in CoV[employer].  RN offers telehealth or telephonic visit for patient this month.  Daughter requests RN call back later this month after seeing how patiet does with getting teeth removed.  Nurse will plan to call back in a few weeks.

## 2018-12-16 ENCOUNTER — Other Ambulatory Visit: Payer: Self-pay

## 2018-12-16 ENCOUNTER — Emergency Department (HOSPITAL_COMMUNITY): Payer: Medicare Other

## 2018-12-16 ENCOUNTER — Encounter: Payer: Self-pay | Admitting: Primary Care

## 2018-12-16 ENCOUNTER — Telehealth (INDEPENDENT_AMBULATORY_CARE_PROVIDER_SITE_OTHER): Payer: Medicare Other | Admitting: Primary Care

## 2018-12-16 ENCOUNTER — Telehealth: Payer: Self-pay

## 2018-12-16 ENCOUNTER — Inpatient Hospital Stay (HOSPITAL_COMMUNITY)
Admission: EM | Admit: 2018-12-16 | Discharge: 2018-12-23 | DRG: 177 | Disposition: A | Discharge status: Home Health | Payer: Medicare Other | Attending: Pulmonary Disease | Admitting: Pulmonary Disease

## 2018-12-16 ENCOUNTER — Encounter (HOSPITAL_COMMUNITY): Payer: Self-pay | Admitting: Emergency Medicine

## 2018-12-16 DIAGNOSIS — I252 Old myocardial infarction: Secondary | ICD-10-CM | POA: Diagnosis not present

## 2018-12-16 DIAGNOSIS — B965 Pseudomonas (aeruginosa) (mallei) (pseudomallei) as the cause of diseases classified elsewhere: Secondary | ICD-10-CM | POA: Diagnosis present

## 2018-12-16 DIAGNOSIS — K5900 Constipation, unspecified: Secondary | ICD-10-CM | POA: Diagnosis present

## 2018-12-16 DIAGNOSIS — I2510 Atherosclerotic heart disease of native coronary artery without angina pectoris: Secondary | ICD-10-CM | POA: Diagnosis present

## 2018-12-16 DIAGNOSIS — Z9889 Other specified postprocedural states: Secondary | ICD-10-CM | POA: Diagnosis not present

## 2018-12-16 DIAGNOSIS — J9621 Acute and chronic respiratory failure with hypoxia: Secondary | ICD-10-CM | POA: Diagnosis present

## 2018-12-16 DIAGNOSIS — Z7952 Long term (current) use of systemic steroids: Secondary | ICD-10-CM

## 2018-12-16 DIAGNOSIS — Z66 Do not resuscitate: Secondary | ICD-10-CM | POA: Diagnosis present

## 2018-12-16 DIAGNOSIS — J90 Pleural effusion, not elsewhere classified: Secondary | ICD-10-CM | POA: Diagnosis not present

## 2018-12-16 DIAGNOSIS — J948 Other specified pleural conditions: Secondary | ICD-10-CM | POA: Diagnosis not present

## 2018-12-16 DIAGNOSIS — Z7902 Long term (current) use of antithrombotics/antiplatelets: Secondary | ICD-10-CM

## 2018-12-16 DIAGNOSIS — Z823 Family history of stroke: Secondary | ICD-10-CM

## 2018-12-16 DIAGNOSIS — Y95 Nosocomial condition: Secondary | ICD-10-CM | POA: Diagnosis present

## 2018-12-16 DIAGNOSIS — F329 Major depressive disorder, single episode, unspecified: Secondary | ICD-10-CM | POA: Diagnosis present

## 2018-12-16 DIAGNOSIS — J189 Pneumonia, unspecified organism: Secondary | ICD-10-CM

## 2018-12-16 DIAGNOSIS — R0602 Shortness of breath: Secondary | ICD-10-CM | POA: Diagnosis not present

## 2018-12-16 DIAGNOSIS — R32 Unspecified urinary incontinence: Secondary | ICD-10-CM | POA: Diagnosis present

## 2018-12-16 DIAGNOSIS — Z79890 Hormone replacement therapy (postmenopausal): Secondary | ICD-10-CM

## 2018-12-16 DIAGNOSIS — Z79899 Other long term (current) drug therapy: Secondary | ICD-10-CM

## 2018-12-16 DIAGNOSIS — J9690 Respiratory failure, unspecified, unspecified whether with hypoxia or hypercapnia: Secondary | ICD-10-CM | POA: Diagnosis not present

## 2018-12-16 DIAGNOSIS — D72829 Elevated white blood cell count, unspecified: Secondary | ICD-10-CM | POA: Diagnosis not present

## 2018-12-16 DIAGNOSIS — F411 Generalized anxiety disorder: Secondary | ICD-10-CM | POA: Diagnosis present

## 2018-12-16 DIAGNOSIS — J471 Bronchiectasis with (acute) exacerbation: Secondary | ICD-10-CM | POA: Diagnosis not present

## 2018-12-16 DIAGNOSIS — H409 Unspecified glaucoma: Secondary | ICD-10-CM | POA: Diagnosis present

## 2018-12-16 DIAGNOSIS — I5032 Chronic diastolic (congestive) heart failure: Secondary | ICD-10-CM | POA: Diagnosis present

## 2018-12-16 DIAGNOSIS — J440 Chronic obstructive pulmonary disease with acute lower respiratory infection: Secondary | ICD-10-CM | POA: Diagnosis present

## 2018-12-16 DIAGNOSIS — J9601 Acute respiratory failure with hypoxia: Secondary | ICD-10-CM

## 2018-12-16 DIAGNOSIS — Z8673 Personal history of transient ischemic attack (TIA), and cerebral infarction without residual deficits: Secondary | ICD-10-CM | POA: Diagnosis not present

## 2018-12-16 DIAGNOSIS — K219 Gastro-esophageal reflux disease without esophagitis: Secondary | ICD-10-CM | POA: Diagnosis present

## 2018-12-16 DIAGNOSIS — E039 Hypothyroidism, unspecified: Secondary | ICD-10-CM | POA: Diagnosis present

## 2018-12-16 DIAGNOSIS — I110 Hypertensive heart disease with heart failure: Secondary | ICD-10-CM | POA: Diagnosis present

## 2018-12-16 DIAGNOSIS — J151 Pneumonia due to Pseudomonas: Principal | ICD-10-CM | POA: Diagnosis present

## 2018-12-16 DIAGNOSIS — R0689 Other abnormalities of breathing: Secondary | ICD-10-CM | POA: Diagnosis not present

## 2018-12-16 DIAGNOSIS — Z8249 Family history of ischemic heart disease and other diseases of the circulatory system: Secondary | ICD-10-CM | POA: Diagnosis not present

## 2018-12-16 DIAGNOSIS — Z20828 Contact with and (suspected) exposure to other viral communicable diseases: Secondary | ICD-10-CM | POA: Diagnosis present

## 2018-12-16 DIAGNOSIS — Z90722 Acquired absence of ovaries, bilateral: Secondary | ICD-10-CM

## 2018-12-16 DIAGNOSIS — R0902 Hypoxemia: Secondary | ICD-10-CM | POA: Diagnosis not present

## 2018-12-16 DIAGNOSIS — R000 Tachycardia, unspecified: Secondary | ICD-10-CM | POA: Diagnosis not present

## 2018-12-16 DIAGNOSIS — Z515 Encounter for palliative care: Secondary | ICD-10-CM | POA: Diagnosis present

## 2018-12-16 DIAGNOSIS — R531 Weakness: Secondary | ICD-10-CM | POA: Diagnosis not present

## 2018-12-16 DIAGNOSIS — I10 Essential (primary) hypertension: Secondary | ICD-10-CM | POA: Diagnosis not present

## 2018-12-16 DIAGNOSIS — J479 Bronchiectasis, uncomplicated: Secondary | ICD-10-CM | POA: Diagnosis not present

## 2018-12-16 DIAGNOSIS — G9341 Metabolic encephalopathy: Secondary | ICD-10-CM | POA: Diagnosis present

## 2018-12-16 DIAGNOSIS — Z90710 Acquired absence of both cervix and uterus: Secondary | ICD-10-CM

## 2018-12-16 LAB — CBC WITH DIFFERENTIAL/PLATELET
Abs Immature Granulocytes: 0.11 10*3/uL — ABNORMAL HIGH (ref 0.00–0.07)
Basophils Absolute: 0 10*3/uL (ref 0.0–0.1)
Basophils Relative: 0 %
Eosinophils Absolute: 0.1 10*3/uL (ref 0.0–0.5)
Eosinophils Relative: 0 %
HCT: 41.6 % (ref 36.0–46.0)
Hemoglobin: 11.4 g/dL — ABNORMAL LOW (ref 12.0–15.0)
Immature Granulocytes: 1 %
Lymphocytes Relative: 9 %
Lymphs Abs: 1.3 10*3/uL (ref 0.7–4.0)
MCH: 25.6 pg — ABNORMAL LOW (ref 26.0–34.0)
MCHC: 27.4 g/dL — ABNORMAL LOW (ref 30.0–36.0)
MCV: 93.5 fL (ref 80.0–100.0)
Monocytes Absolute: 1.3 10*3/uL — ABNORMAL HIGH (ref 0.1–1.0)
Monocytes Relative: 8 %
Neutro Abs: 12.8 10*3/uL — ABNORMAL HIGH (ref 1.7–7.7)
Neutrophils Relative %: 82 %
Platelets: 272 10*3/uL (ref 150–400)
RBC: 4.45 MIL/uL (ref 3.87–5.11)
RDW: 19.9 % — ABNORMAL HIGH (ref 11.5–15.5)
WBC: 15.6 10*3/uL — ABNORMAL HIGH (ref 4.0–10.5)
nRBC: 0 % (ref 0.0–0.2)

## 2018-12-16 LAB — SARS CORONAVIRUS 2 (TAT 6-24 HRS): SARS Coronavirus 2: NEGATIVE

## 2018-12-16 LAB — COMPREHENSIVE METABOLIC PANEL
ALT: 14 U/L (ref 0–44)
AST: 21 U/L (ref 15–41)
Albumin: 3.3 g/dL — ABNORMAL LOW (ref 3.5–5.0)
Alkaline Phosphatase: 58 U/L (ref 38–126)
Anion gap: 12 (ref 5–15)
BUN: 13 mg/dL (ref 8–23)
CO2: 33 mmol/L — ABNORMAL HIGH (ref 22–32)
Calcium: 8.8 mg/dL — ABNORMAL LOW (ref 8.9–10.3)
Chloride: 94 mmol/L — ABNORMAL LOW (ref 98–111)
Creatinine, Ser: 0.96 mg/dL (ref 0.44–1.00)
GFR calc Af Amer: 58 mL/min — ABNORMAL LOW (ref >60)
GFR calc non Af Amer: 50 mL/min — ABNORMAL LOW (ref >60)
Glucose, Bld: 119 mg/dL — ABNORMAL HIGH (ref 70–99)
Potassium: 4 mmol/L (ref 3.5–5.1)
Sodium: 139 mmol/L (ref 135–145)
Total Bilirubin: 0.6 mg/dL (ref 0.3–1.2)
Total Protein: 6.7 g/dL (ref 6.5–8.1)

## 2018-12-16 LAB — BLOOD GAS, ARTERIAL
Acid-Base Excess: 9.5 mmol/L — ABNORMAL HIGH (ref 0.0–2.0)
Bicarbonate: 36.2 mmol/L — ABNORMAL HIGH (ref 20.0–28.0)
Drawn by: 270211
O2 Content: 2 L/min
O2 Saturation: 95.4 %
Patient temperature: 98.6
pCO2 arterial: 62.5 mmHg — ABNORMAL HIGH (ref 32.0–48.0)
pH, Arterial: 7.38 (ref 7.350–7.450)
pO2, Arterial: 85.9 mmHg (ref 83.0–108.0)

## 2018-12-16 LAB — URINALYSIS, ROUTINE W REFLEX MICROSCOPIC
Bilirubin Urine: NEGATIVE
Glucose, UA: NEGATIVE mg/dL
Hgb urine dipstick: NEGATIVE
Ketones, ur: 5 mg/dL — AB
Leukocytes,Ua: NEGATIVE
Nitrite: NEGATIVE
Protein, ur: NEGATIVE mg/dL
Specific Gravity, Urine: 1.016 (ref 1.005–1.030)
pH: 7 (ref 5.0–8.0)

## 2018-12-16 LAB — POC SARS CORONAVIRUS 2 AG -  ED: SARS Coronavirus 2 Ag: NEGATIVE

## 2018-12-16 LAB — BRAIN NATRIURETIC PEPTIDE: B Natriuretic Peptide: 212.3 pg/mL — ABNORMAL HIGH (ref 0.0–100.0)

## 2018-12-16 LAB — TROPONIN I (HIGH SENSITIVITY)
Troponin I (High Sensitivity): 20 ng/L — ABNORMAL HIGH (ref <18)
Troponin I (High Sensitivity): 17 ng/L (ref <18)
Troponin I (High Sensitivity): 14 ng/L (ref <18)

## 2018-12-16 MED ORDER — SODIUM CHLORIDE 0.9 % IV SOLN
1.0000 g | INTRAVENOUS | Status: DC
  Administered 2018-12-17: 1 g via INTRAVENOUS
  Filled 2018-12-16: qty 1

## 2018-12-16 MED ORDER — ENOXAPARIN SODIUM 30 MG/0.3ML ~~LOC~~ SOLN
30.0000 mg | SUBCUTANEOUS | Status: DC
  Administered 2018-12-17: 30 mg via SUBCUTANEOUS
  Filled 2018-12-16: qty 0.3

## 2018-12-16 MED ORDER — SODIUM CHLORIDE 0.9 % IV SOLN
500.0000 mg | INTRAVENOUS | Status: DC
  Administered 2018-12-17 – 2018-12-20 (×4): 500 mg via INTRAVENOUS
  Filled 2018-12-16 (×5): qty 500

## 2018-12-16 MED ORDER — LEVOTHYROXINE SODIUM 50 MCG PO TABS
50.0000 ug | ORAL_TABLET | Freq: Every day | ORAL | Status: DC
  Administered 2018-12-17 – 2018-12-23 (×7): 50 ug via ORAL
  Filled 2018-12-16 (×7): qty 1

## 2018-12-16 MED ORDER — ALPRAZOLAM 0.5 MG PO TABS
0.5000 mg | ORAL_TABLET | Freq: Two times a day (BID) | ORAL | Status: DC
  Administered 2018-12-16 – 2018-12-17 (×2): 0.5 mg via ORAL
  Filled 2018-12-16 (×2): qty 1

## 2018-12-16 MED ORDER — SODIUM CHLORIDE 0.9 % IV SOLN
500.0000 mg | Freq: Once | INTRAVENOUS | Status: AC
  Administered 2018-12-16: 500 mg via INTRAVENOUS
  Filled 2018-12-16: qty 500

## 2018-12-16 MED ORDER — ALPRAZOLAM 0.5 MG PO TABS: 1.0000 mg | ORAL_TABLET | Freq: Two times a day (BID) | ORAL | Status: DC

## 2018-12-16 MED ORDER — BRIMONIDINE TARTRATE 0.15 % OP SOLN
1.0000 [drp] | Freq: Three times a day (TID) | OPHTHALMIC | Status: DC
  Administered 2018-12-17 – 2018-12-23 (×19): 1 [drp] via OPHTHALMIC
  Filled 2018-12-16: qty 5

## 2018-12-16 MED ORDER — LATANOPROST 0.005 % OP SOLN
1.0000 [drp] | Freq: Every day | OPHTHALMIC | Status: DC
  Administered 2018-12-16 – 2018-12-22 (×7): 1 [drp] via OPHTHALMIC
  Filled 2018-12-16: qty 2.5

## 2018-12-16 MED ORDER — PANTOPRAZOLE SODIUM 40 MG PO TBEC
40.0000 mg | DELAYED_RELEASE_TABLET | Freq: Two times a day (BID) | ORAL | Status: DC
  Administered 2018-12-16 – 2018-12-23 (×14): 40 mg via ORAL
  Filled 2018-12-16 (×14): qty 1

## 2018-12-16 MED ORDER — IPRATROPIUM BROMIDE HFA 17 MCG/ACT IN AERS
2.0000 | INHALATION_SPRAY | Freq: Once | RESPIRATORY_TRACT | Status: AC
  Administered 2018-12-16: 2 via RESPIRATORY_TRACT
  Filled 2018-12-16: qty 12.9

## 2018-12-16 MED ORDER — ALBUTEROL SULFATE HFA 108 (90 BASE) MCG/ACT IN AERS
2.0000 | INHALATION_SPRAY | Freq: Once | RESPIRATORY_TRACT | Status: AC
  Administered 2018-12-16: 2 via RESPIRATORY_TRACT
  Filled 2018-12-16: qty 6.7

## 2018-12-16 MED ORDER — SODIUM CHLORIDE 0.9 % IV SOLN
INTRAVENOUS | Status: DC
  Administered 2018-12-16 – 2018-12-17 (×3): via INTRAVENOUS

## 2018-12-16 MED ORDER — ALBUTEROL SULFATE (2.5 MG/3ML) 0.083% IN NEBU
2.5000 mg | INHALATION_SOLUTION | Freq: Four times a day (QID) | RESPIRATORY_TRACT | Status: DC
  Administered 2018-12-16: 2.5 mg via RESPIRATORY_TRACT
  Filled 2018-12-16: qty 3

## 2018-12-16 MED ORDER — ESCITALOPRAM OXALATE 5 MG PO TABS
5.0000 mg | ORAL_TABLET | Freq: Every day | ORAL | Status: DC
  Administered 2018-12-17 – 2018-12-18 (×2): 5 mg via ORAL
  Filled 2018-12-16 (×2): qty 1

## 2018-12-16 MED ORDER — PREDNISONE 10 MG PO TABS
10.0000 mg | ORAL_TABLET | Freq: Every day | ORAL | Status: DC
  Administered 2018-12-17 – 2018-12-23 (×7): 10 mg via ORAL
  Filled 2018-12-16 (×7): qty 1

## 2018-12-16 MED ORDER — SODIUM CHLORIDE 0.9 % IV SOLN
1.0000 g | Freq: Once | INTRAVENOUS | Status: AC
  Administered 2018-12-16: 1 g via INTRAVENOUS
  Filled 2018-12-16: qty 10

## 2018-12-16 MED ORDER — ALBUTEROL SULFATE (2.5 MG/3ML) 0.083% IN NEBU
2.5000 mg | INHALATION_SOLUTION | Freq: Three times a day (TID) | RESPIRATORY_TRACT | Status: DC
  Administered 2018-12-17 – 2018-12-18 (×3): 2.5 mg via RESPIRATORY_TRACT
  Filled 2018-12-16 (×3): qty 3

## 2018-12-16 MED ORDER — CLOPIDOGREL BISULFATE 75 MG PO TABS
75.0000 mg | ORAL_TABLET | Freq: Every day | ORAL | Status: DC
  Administered 2018-12-17 – 2018-12-23 (×7): 75 mg via ORAL
  Filled 2018-12-16 (×7): qty 1

## 2018-12-16 NOTE — ED Provider Notes (Signed)
[facility] DEPT Provider Note   CSN: [id number] Arrival date & time: 12/16/18  1009     History   Chief Complaint Chief Complaint  Patient presents with  . Shortness of Breath    HPI Sarah [name] is a 85 y.o. female.  Presented with increased cough, shortness of breath, generalized weakness. Family reports noted somewhat of a cough, mild increased work of breathing yesterday. Today though much more lethargic, very limited ability to move due to generalized weakness and dyspnea.  At time of my exam, patient denies any focal pain, states she feels generally weak and more short of breath than normal.  No fevers known.     HPI  Past Medical History:  Diagnosis Date  . Allergic rhinitis   . Anxiety    Anxiety attacks  . Arthritis    "a little; in my back and hands" (04/15/2012)  . Asthma   . Bronchiectasis    with history of Legionaires with chronic rales/rhonchi  . Chest pain at rest   . Daily headache    "over the last week" 04/15/2012   . Depression   . Diastolic dysfunction    a. per echo 08/2010 with normal LV function;  b. 06/2011 Echo: EF 65-70%  . GERD (gastroesophageal reflux disease)    "just recently" (04/15/2012)  . H/O hiatal hernia   . H/O Legionnaire's disease 11/1976  . History of blood transfusion 1972   "w/hysterectomy" (04/15/2012)  . Hypercholesteremia   . Hypertension    Negative renal duplex in December of 2012  . Hypothyroidism   . Idiopathic peripheral neuropathy   . MAC (mycobacterium avium-intracellulare complex)   . Migraines    "outgrew them" (04/15/2012)  . NSTEMI (non-ST elevated myocardial infarction) ([place])    a. Normal coronaries per cath August 2012 and negative CT angio for PE;  b. 06/2011 Repeat admission w/ chest pain and elevated troponin's, CTA Chest w/o PE  . Osteopenia 02/2011   t score - 2.1  . Pneumonia    "recurrent" (04/15/2012)  . Pollen allergies   . Shortness of breath    "sometimes just lying  down" (04/15/2012)  . Stroke [facility]) 06/2014   cerebellum    Patient Active Problem List   Diagnosis Date Noted  . Hemoptysis 03/16/2018  . GERD (gastroesophageal reflux disease) 03/19/2017  . Nausea vomiting and diarrhea   . Gastroenteritis due to norovirus 01/25/2017  . Adrenal insufficiency ([place]) 03/21/2015  . HTN (hypertension) 02/14/2015  . Fecal impaction ([place]) 10/07/2014  . Colitis 10/06/2014  . Cerebral infarction due to thrombosis of basilar artery ([place]) 09/09/2014  . Aneurysm, cerebral, nonruptured 09/09/2014  . HLD (hyperlipidemia) 09/09/2014  . Acute ischemic stroke ([place]) 07/09/2014  . Chronic diastolic CHF (congestive heart failure) ([place]) 07/09/2014  . Stroke ([place]) 07/08/2014  . TIA (transient ischemic attack) 07/08/2014  . Chest pain 04/15/2012  . Shortness of breath dyspnea 11/23/2011  . Anxiety 11/23/2011  . Malignant hypertension 11/23/2011  . Elevated troponin 06/20/2011  . Depression   . Hypothyroidism   . Arthritis   . Racing heart beat 02/11/2011  . NSTEMI (non-ST elevated myocardial infarction) ([place]) 09/24/2010  . Idiopathic peripheral neuropathy   . Pollen allergies   . BRONCHIECTASIS 07/29/2008  . Essential hypertension 06/15/2008  . Allergic rhinitis 06/15/2008  . Asthma 06/15/2008  . Cough 06/15/2008    Past Surgical History:  Procedure Laterality Date  . APPENDECTOMY    . CARDIAC CATHETERIZATION  August 2012   Normal coronaries.  Marland Kitchen  DILATION AND CURETTAGE OF UTERUS    . TONSILLECTOMY  1930  . TOTAL ABDOMINAL HYSTERECTOMY W/ BILATERAL SALPINGOOPHORECTOMY  1972   leiomyomata, menorrhagia     OB History    Gravida  2   Para  2   Term      Preterm      AB      Living  2     SAB      TAB      Ectopic      Multiple      Live Births               Home Medications    Prior to Admission medications   Medication Sig Start Date End Date Taking? Authorizing Provider  albuterol (PROVENTIL) (2.5 MG/3ML) 0.083% nebulizer  solution USE 1 VIAL VIA NEBULIZER TWICE DAILY 08/04/18   Byrum, Rose Fillers, MD  ALPRAZolam Duanne Moron) 0.5 MG tablet Take 1 tablet by mouth 3 (three) times daily. 04/06/18   [name], MD  azithromycin (ZITHROMAX) 250 MG tablet Take 1 tablet (250 mg total) by mouth daily. Every other month 12/15/18   Collene Gobble, MD  calcium carbonate (TUMS - DOSED IN MG ELEMENTAL CALCIUM) 500 MG chewable tablet Chew 1 tablet by mouth every 8 (eight) hours as needed for indigestion or heartburn.    [provider]  carvedilol (COREG) 6.25 MG tablet TAKE 1 TABLET(6.25 MG) BY MOUTH TWICE DAILY 06/24/18   Nahser, Wonda Cheng, MD  cefdinir (OMNICEF) 300 MG capsule Take 300 mg by mouth 2 (two) times daily. Every other month    [provider]  clopidogrel (PLAVIX) 75 MG tablet TAKE 1 TABLET(75 MG) BY MOUTH DAILY 10/08/18   Nahser, Wonda Cheng, MD  DULoxetine (CYMBALTA) 20 MG capsule Take 20 mg by mouth 3 (three) times daily. 06/14/14   [provider]  escitalopram (LEXAPRO) 5 MG tablet Take 2.5 mg by mouth daily.    [provider]  gabapentin (NEURONTIN) 100 MG capsule Take 300 mg by mouth 3 (three) times daily.     [provider]  Iron-FA-B Cmp-C-Biot-Probiotic (FUSION PLUS PO) Take 1 tablet by mouth daily.    [provider]  latanoprost (XALATAN) 0.005 % ophthalmic solution Place 1 drop into both eyes at bedtime.  08/30/14   [provider]  levothyroxine (SYNTHROID, LEVOTHROID) 50 MCG tablet Take 50 mcg by mouth daily.      [provider]  MULTIPLE VITAMIN PO Take 1 tablet by mouth daily.    [provider]  mupirocin ointment (BACTROBAN) 2 % Apply to toes daily 06/15/18   Marzetta Board, DPM  pantoprazole (PROTONIX) 40 MG tablet Take 40 mg by mouth 2 (two) times daily.    [provider]  predniSONE (DELTASONE) 10 MG tablet TAKE 1 TABLET(10 MG) BY MOUTH DAILY WITH BREAKFAST 11/16/18   Collene Gobble, MD  Probiotic Product  (PROBIOTIC ADVANCED PO) Take 1 tablet by mouth daily.    [provider]  Sennosides (SENOKOT PO) Take 1 tablet by mouth as needed.     [provider]  VITAMIN D, CHOLECALCIFEROL, PO Take 2 tablets by mouth daily.     [provider]    Family History Family History  Problem Relation Age of Onset  . Hypertension Mother        stroke  . Stroke Mother   . Cancer Brother        prostate  . Osteoarthritis Paternal Aunt  Social History Social History   Tobacco Use  . Smoking status: Never Smoker  . Smokeless tobacco: Never Used  Substance Use Topics  . Alcohol use: Yes    Alcohol/week: 2.0 standard drinks    Types: 2 Glasses of wine per week    Comment: wine 1-2 week  . Drug use: No     Allergies   Codeine, Hydrocodone, Isosorbide nitrate, Oxycodone, Clarithromycin, Fiorinal [butalbital-aspirin-caffeine], Levofloxacin, Augmentin [amoxicillin-pot clavulanate], and Doxycycline   Review of Systems Review of Systems  Constitutional: Positive for chills. Negative for fever.  HENT: Negative for ear pain and sore throat.   Eyes: Negative for pain and visual disturbance.  Respiratory: Positive for cough and shortness of breath.   Cardiovascular: Negative for chest pain and palpitations.  Gastrointestinal: Negative for abdominal pain and vomiting.  Genitourinary: Negative for dysuria and hematuria.  Musculoskeletal: Negative for arthralgias and back pain.  Skin: Negative for color change and rash.  Neurological: Negative for seizures and syncope.  All other systems reviewed and are negative.    Physical Exam Updated Vital Signs BP 104/64   Pulse 87   Temp 98 F (36.7 C) (Oral)   Resp 17   Ht _0  (1.626 m)   Wt 63.5 kg   LMP 01/14/1970   SpO2 100%   BMI 24.03 kg/m   Physical Exam Vitals signs and nursing note reviewed.  Constitutional:      Comments: Somewhat increased work of breathing but not in distress  HENT:     Head:  Normocephalic and atraumatic.  Eyes:     Conjunctiva/sclera: Conjunctivae normal.  Neck:     Musculoskeletal: Neck supple.  Cardiovascular:     Rate and Rhythm: Normal rate and regular rhythm.     Heart sounds: No murmur.  Pulmonary:     Effort: No respiratory distress.     Breath sounds: Normal breath sounds.     Comments: Mild increased work of breathing, mild tachypnea, no distress, decreased breath sounds over left side, crackles on right side Chest:     Chest wall: No mass or deformity.  Abdominal:     Palpations: Abdomen is soft.     Tenderness: There is no abdominal tenderness.  Musculoskeletal:     Right lower leg: No edema.     Left lower leg: No edema.  Skin:    General: Skin is warm and dry.     Capillary Refill: Capillary refill takes less than 2 seconds.  Neurological:     Mental Status: She is alert.      ED Treatments / Results  Labs (all labs ordered are listed, but only abnormal results are displayed) Labs Reviewed  CULTURE, BLOOD (ROUTINE X 2)  CULTURE, BLOOD (ROUTINE X 2)  CBC WITH DIFFERENTIAL/PLATELET  COMPREHENSIVE METABOLIC PANEL  BRAIN NATRIURETIC PEPTIDE  POC SARS CORONAVIRUS 2 AG -  ED  TROPONIN I (HIGH SENSITIVITY)    EKG None  Radiology Dg Chest Portable 1 View  Result Date: 12/16/2018 CLINICAL DATA:  Short of breath.  Evaluate for pneumonia. EXAM: PORTABLE CHEST 1 VIEW COMPARISON:  07/06/2018 FINDINGS: Unchanged cardiac enlargement. There is a moderate left pleural effusion which is new from previous exam. Airspace opacification within the left midlung and left base noted. Stable nodular density within the right upper lobe dating back to 03/24/2015 compatible with an area of scarring. IMPRESSION: 1. Left midlung and left base airspace consolidation with moderate left pleural effusion. Imaging findings concerning for pneumonia. Followup PA and lateral chest  X-ray is recommended in 3-4 weeks following trial of antibiotic therapy to ensure  resolution and exclude underlying malignancy. Electronically Signed   By: Kerby Moors M.D.   On: 12/16/2018 11:00    Procedures .Critical Care Performed by: Lucrezia Starch, MD Authorized by: Lucrezia Starch, MD   Critical care provider statement:    Critical care time (minutes):  45   Critical care was necessary to treat or prevent imminent or life-threatening deterioration of the following conditions:  Respiratory failure   Critical care was time spent personally by me on the following activities:  Discussions with consultants, evaluation of patient's response to treatment, examination of patient, ordering and performing treatments and interventions, ordering and review of laboratory studies, ordering and review of radiographic studies, pulse oximetry, re-evaluation of patient's condition, obtaining history from patient or surrogate and review of old charts   (including critical care time)  Medications Ordered in ED Medications  cefTRIAXone (ROCEPHIN) 1 g in sodium chloride 0.9 % 100 mL IVPB (has no administration in time range)  azithromycin (ZITHROMAX) 500 mg in sodium chloride 0.9 % 250 mL IVPB (has no administration in time range)  albuterol (VENTOLIN HFA) 108 (90 Base) MCG/ACT inhaler 2 puff (2 puffs Inhalation Given 12/16/18 1113)  ipratropium (ATROVENT HFA) inhaler 2 puff (2 puffs Inhalation Given 12/16/18 1114)     Initial Impression / Assessment and Plan / ED Course  I have reviewed the triage vital signs and the nursing notes.  Pertinent labs & imaging results that were available during my care of the patient were reviewed by me and considered in my medical decision making (see chart for details).  Clinical Course as of Dec 15 1149  Wed Dec 16, 2018  1140 Updated daughter on plan for admission, current status,   [RD]  1147 Recheck patient, unchanged   [RD]    Clinical Course User Index [RD] Lucrezia Starch, MD       Past medical history of  bronchiectasis, diastolic heart failure, stroke, hypertension, hypothyroidism presents to ER with cough, dyspnea.  On exam patient noted to have mild tachypnea, mild work of breathing but no respiratory distress.  Chest x-ray concerning for pneumonia.  Labs demonstrate leukocytosis.  Rapid antigen Covid was negative.  Will send for PCR test.  Started empirically on Rocephin and azithromycin.  Obtain blood cultures, will admit hospitalist.  Daughter confirmed patient is DNR/DNI, has durable DNR.  Is interested in pursuing other therapies at this time.  Final Clinical Impressions(s) / ED Diagnoses   Final diagnoses:  Pneumonia of left lung due to infectious organism, unspecified part of lung  Leukocytosis, unspecified type    ED Discharge Orders    None       Lucrezia Starch, MD 12/16/18 1232

## 2018-12-16 NOTE — Telephone Encounter (Signed)
SW received a call from daughter, Sarah [name] notifying that patient was admitted to [facility] this morning. Hospital liaison and palliative admin team notified. Palliative care team will plan to follow-up at discharge.

## 2018-12-16 NOTE — ED Triage Notes (Signed)
Patient from facility, EMS called d/t increased unresponsiveness. Pt alert when EMS arrived. Pt complaining of SOB, 72% on RA. Given solumedrol PTA   BP 158/88 RR 28 92% on 3L P 80

## 2018-12-16 NOTE — H&P (Addendum)
History and Physical    Sarah [name] PRF:[id number] DOB: September 18, 1921 DOA: 12/16/2018  PCP: [name], MD Patient coming from: [place]  Chief Complaint: SOB  HPI: [name] is a 85 y.o. female with medical history significant of bronchiectasis, chronic diastolic CHF, stroke, asthma, hypertension, NSTEMI, anxiety attacks, admitted with complaints of shortness of breath cough with no fever.  Per daughter she was starting to cough up dark stuff.  She was found to be hypoxic 70s on room air.  No documented fever.  Patient is on prednisone 10 mg daily and alternating Omnicef and azithromycin first week of every month.  She was due to be started on azithromycin 12/20/2018.  Per daughter she was doing fine until this morning 7 AM.  Patient started with progressive worsening of shortness of breath increased to cough since this morning. Patient had a video visit with nurse practitioner with [name] today.  She is followed by palliative care as an outpatient. She had urinary incontinence today which was unusual for the patient. She has no history of dementia.  Denies dysphagia or aspiration. She did complain of chest pressure yesterday to the daughter. POC Covid antigen test negative. Repeat Covid test pending. History is obtained from the records and patient's daughter.  ED Course: Blood pressure 110/57 pulse is 82 respiration 18 sats 99% on 3 L.  Temperature 98.  Her saturation was 70s on room air.  Chest x-ray shows left lower and left mid lobe moderate pleural effusion and consolidation.  Review of Systems: As per HPI otherwise all other systems reviewed and are negative  Ambulatory Status: Per daughter she walks with a walker she gets physical therapy at the facility.  She showers by herself.    Past Medical History:  Diagnosis Date  . Allergic rhinitis   . Anxiety    Anxiety attacks  . Arthritis    "a little; in my back and hands" (04/15/2012)  . Asthma   . Bronchiectasis    with  history of Legionaires with chronic rales/rhonchi  . Chest pain at rest   . Daily headache    "over the last week" 04/15/2012   . Depression   . Diastolic dysfunction    a. per echo 08/2010 with normal LV function;  b. 06/2011 Echo: EF 65-70%  . GERD (gastroesophageal reflux disease)    "just recently" (04/15/2012)  . H/O hiatal hernia   . H/O Legionnaire's disease 11/1976  . History of blood transfusion 1972   "w/hysterectomy" (04/15/2012)  . Hypercholesteremia   . Hypertension    Negative renal duplex in December of 2012  . Hypothyroidism   . Idiopathic peripheral neuropathy   . MAC (mycobacterium avium-intracellulare complex)   . Migraines    "outgrew them" (04/15/2012)  . NSTEMI (non-ST elevated myocardial infarction) ([place])    a. Normal coronaries per cath August 2012 and negative CT angio for PE;  b. 06/2011 Repeat admission w/ chest pain and elevated troponin's, CTA Chest w/o PE  . Osteopenia 02/2011   t score - 2.1  . Pneumonia    "recurrent" (04/15/2012)  . Pollen allergies   . Shortness of breath    "sometimes just lying down" (04/15/2012)  . Stroke [facility]) 06/2014   cerebellum    Past Surgical History:  Procedure Laterality Date  . APPENDECTOMY    . CARDIAC CATHETERIZATION  August 2012   Normal coronaries.  Marland Kitchen DILATION AND CURETTAGE OF UTERUS    . TONSILLECTOMY  1930  . TOTAL ABDOMINAL HYSTERECTOMY W/  BILATERAL SALPINGOOPHORECTOMY  1972   leiomyomata, menorrhagia    Social History   Socioeconomic History  . Marital status: Married    Spouse name: Not on file  . Number of children: Not on file  . Years of education: Not on file  . Highest education level: Not on file  Occupational History  . Occupation: retired Education officer, museum  Social Needs  . Financial resource strain: Not on file  . Food insecurity    Worry: Not on file    Inability: Not on file  . Transportation needs    Medical: Not on file    Non-medical: Not on file  Tobacco Use  . Smoking status: Never  Smoker  . Smokeless tobacco: Never Used  Substance and Sexual Activity  . Alcohol use: Yes    Alcohol/week: 2.0 standard drinks    Types: 2 Glasses of wine per week    Comment: wine 1-2 week  . Drug use: No  . Sexual activity: Never    Birth control/protection: Surgical  Lifestyle  . Physical activity    Days per week: Not on file    Minutes per session: Not on file  . Stress: Not on file  Relationships  . Social Herbalist on phone: Not on file    Gets together: Not on file    Attends religious service: Not on file    Active member of club or organization: Not on file    Attends meetings of clubs or organizations: Not on file    Relationship status: Not on file  . Intimate partner violence    Fear of current or ex partner: Not on file    Emotionally abused: Not on file    Physically abused: Not on file    Forced sexual activity: Not on file  Other Topics Concern  . Not on file  Social History Narrative   Lives in an assisted living    Allergies  Allergen Reactions  . Codeine Anaphylaxis  . Hydrocodone Nausea And Vomiting and Other (See Comments)    SYNCOPE AND BRADYCARDIA  . Isosorbide Nitrate Other (See Comments)    BRADYCARDIA  . Oxycodone Palpitations    Rapid heart beat  . Clarithromycin Nausea And Vomiting    Has patient had a PCN reaction causing immediate rash, facial/tongue/throat swelling, SOB or lightheadedness with hypotension: Unknown Has patient had a PCN reaction causing severe rash involving mucus membranes or skin necrosis: Unknown Has patient had a PCN reaction that required hospitalization: Unknown Has patient had a PCN reaction occurring within the last 10 years: Unknown If all of the above answers are "NO", then may proceed with Cephalosporin use.   . Fiorinal [Butalbital-Aspirin-Caffeine] Swelling and Other (See Comments)    Facial swelling   . Levofloxacin Nausea And Vomiting and Other (See Comments)    SYNCOPE ALSO  . Augmentin  [Amoxicillin-Pot Clavulanate] Diarrhea  . Doxycycline     Sweats and aches    Family History  Problem Relation Age of Onset  . Hypertension Mother        stroke  . Stroke Mother   . Cancer Brother        prostate  . Osteoarthritis Paternal Aunt     Prior to Admission medications   Medication Sig Start Date End Date Taking? Authorizing Provider  albuterol (PROVENTIL) (2.5 MG/3ML) 0.083% nebulizer solution USE 1 VIAL VIA NEBULIZER TWICE DAILY 08/04/18   Byrum, Rose Fillers, MD  ALPRAZolam Duanne Moron) 0.5 MG tablet Take  1 tablet by mouth 3 (three) times daily. 04/06/18   [name], MD  azithromycin (ZITHROMAX) 250 MG tablet Take 1 tablet (250 mg total) by mouth daily. Every other month 12/15/18   Collene Gobble, MD  calcium carbonate (TUMS - DOSED IN MG ELEMENTAL CALCIUM) 500 MG chewable tablet Chew 1 tablet by mouth every 8 (eight) hours as needed for indigestion or heartburn.    [provider]  carvedilol (COREG) 6.25 MG tablet TAKE 1 TABLET(6.25 MG) BY MOUTH TWICE DAILY 06/24/18   Nahser, Wonda Cheng, MD  cefdinir (OMNICEF) 300 MG capsule Take 300 mg by mouth 2 (two) times daily. Every other month    [provider]  clopidogrel (PLAVIX) 75 MG tablet TAKE 1 TABLET(75 MG) BY MOUTH DAILY 10/08/18   Nahser, Wonda Cheng, MD  DULoxetine (CYMBALTA) 20 MG capsule Take 20 mg by mouth 3 (three) times daily. 06/14/14   [provider]  escitalopram (LEXAPRO) 5 MG tablet Take 2.5 mg by mouth daily.    [provider]  gabapentin (NEURONTIN) 100 MG capsule Take 300 mg by mouth 3 (three) times daily.     [provider]  Iron-FA-B Cmp-C-Biot-Probiotic (FUSION PLUS PO) Take 1 tablet by mouth daily.    [provider]  latanoprost (XALATAN) 0.005 % ophthalmic solution Place 1 drop into both eyes at bedtime.  08/30/14   [provider]  levothyroxine (SYNTHROID, LEVOTHROID) 50 MCG tablet Take 50 mcg by mouth daily.      [provider]   MULTIPLE VITAMIN PO Take 1 tablet by mouth daily.    [provider]  mupirocin ointment (BACTROBAN) 2 % Apply to toes daily 06/15/18   Marzetta Board, DPM  pantoprazole (PROTONIX) 40 MG tablet Take 40 mg by mouth 2 (two) times daily.    [provider]  predniSONE (DELTASONE) 10 MG tablet TAKE 1 TABLET(10 MG) BY MOUTH DAILY WITH BREAKFAST 11/16/18   Collene Gobble, MD  Probiotic Product (PROBIOTIC ADVANCED PO) Take 1 tablet by mouth daily.    [provider]  Sennosides (SENOKOT PO) Take 1 tablet by mouth as needed.     [provider]  VITAMIN D, CHOLECALCIFEROL, PO Take 2 tablets by mouth daily.     [provider]    Physical Exam: Vitals:   12/16/18 1130 12/16/18 1212 12/16/18 1300 12/16/18 1330  BP: 119/65 112/78  (!) 110/57  Pulse:  88 86 82  Resp: (!) 22 (!) _0 Temp:      TempSrc:      SpO2: 98% 97% 100% 99%  Weight:      Height:         . General: Appears calm and comfortable . Eyes:  PERRL, EOMI, normal lids, iris . ENT:  grossly normal hearing, lips & tongue, mmm . Neck:  no LAD, masses or thyromegaly . Cardiovascular: RRR, no m/r/g. No LE edema.  Marland Kitchen Respiratory: Decreased breath sounds at the left base with scattered rhonchi in both lung fields no w/r/r. Normal respiratory effort. . Abdomen: soft, ntnd, NABS . Skin: no rash or induration seen on limited exam . Musculoskeletal: Trace bilateral lower extremity pitting edema psychiatric: Unable to assess, she responds to her name and responds to pain neurologic: Unable to assess, she responds to her name and responds to pain Labs on Admission: I have personally reviewed following labs and imaging studies  CBC: Recent Labs  Lab 12/16/18 1101  WBC 15.6*  NEUTROABS 12.8*  HGB 11.4*  HCT 41.6  MCV 93.5  PLT 630   Basic Metabolic Panel: Recent Labs  Lab 12/16/18 1101  NA 139  K 4.0  CL 94*  CO2 33*  GLUCOSE 119*  BUN 13  CREATININE 0.96  CALCIUM 8.8*    GFR: Estimated Creatinine Clearance: 30.3 mL/min (by C-G formula based on SCr of 0.96 mg/dL). Liver Function Tests: Recent Labs  Lab 12/16/18 1101  AST 21  ALT 14  ALKPHOS 58  BILITOT 0.6  PROT 6.7  ALBUMIN 3.3*   No results for input(s): LIPASE, AMYLASE in the last 168 hours. No results for input(s): AMMONIA in the last 168 hours. Coagulation Profile: No results for input(s): INR, PROTIME in the last 168 hours. Cardiac Enzymes: No results for input(s): CKTOTAL, CKMB, CKMBINDEX, TROPONINI in the last 168 hours. BNP (last 3 results) No results for input(s): PROBNP in the last 8760 hours. HbA1C: No results for input(s): HGBA1C in the last 72 hours. CBG: No results for input(s): GLUCAP in the last 168 hours. Lipid Profile: No results for input(s): CHOL, HDL, LDLCALC, TRIG, CHOLHDL, LDLDIRECT in the last 72 hours. Thyroid Function Tests: No results for input(s): TSH, T4TOTAL, FREET4, T3FREE, THYROIDAB in the last 72 hours. Anemia Panel: No results for input(s): VITAMINB12, FOLATE, FERRITIN, TIBC, IRON, RETICCTPCT in the last 72 hours. Urine analysis:    Component Value Date/Time   COLORURINE YELLOW 01/25/2017 1832   APPEARANCEUR CLEAR 01/25/2017 1832   LABSPEC 1.018 01/25/2017 1832   PHURINE 5.0 01/25/2017 1832   GLUCOSEU NEGATIVE 01/25/2017 1832   HGBUR SMALL (A) 01/25/2017 1832   BILIRUBINUR NEGATIVE 01/25/2017 1832   KETONESUR NEGATIVE 01/25/2017 1832   PROTEINUR NEGATIVE 01/25/2017 1832   UROBILINOGEN 1.0 10/06/2014 1730   NITRITE NEGATIVE 01/25/2017 1832   LEUKOCYTESUR MODERATE (A) 01/25/2017 1832    Creatinine Clearance: Estimated Creatinine Clearance: 30.3 mL/min (by C-G formula based on SCr of 0.96 mg/dL).  Sepsis Labs: _0 (procalcitonin:4,lacticidven:4) )No results found for this or any previous visit (from the past 240 hour(s)).   Radiological Exams on Admission: Dg Chest Portable 1 View  Result Date: 12/16/2018 CLINICAL DATA:  Short of  breath.  Evaluate for pneumonia. EXAM: PORTABLE CHEST 1 VIEW COMPARISON:  07/06/2018 FINDINGS: Unchanged cardiac enlargement. There is a moderate left pleural effusion which is new from previous exam. Airspace opacification within the left midlung and left base noted. Stable nodular density within the right upper lobe dating back to 03/24/2015 compatible with an area of scarring. IMPRESSION: 1. Left midlung and left base airspace consolidation with moderate left pleural effusion. Imaging findings concerning for pneumonia. Followup PA and lateral chest X-ray is recommended in 3-4 weeks following trial of antibiotic therapy to ensure resolution and exclude underlying malignancy. Electronically Signed   By: Kerby Moors M.D.   On: 12/16/2018 11:00    EKG: Independently reviewed.   Assessment/Plan Active Problems:   HCAP (healthcare-associated pneumonia)    #1 acute hypoxic respiratory failure in the setting of bronchiectasis with left midlung and left lower lobe consolidation with left moderate pleural effusion.  Patient admitted with shortness of breath and cough with no fever.  She was found to be hypoxic in the 70s on room air at the facility.  She received Rocephin and azithromycin in the ER which we will continue.  She is on alternating dose of Omnicef and azithromycin first week of every month with prednisone 10 mg daily. Check MRSA PCR. Check ABG. Albuterol inhaler Prednisone 10 mg daily  #2 essential  hypertension her blood pressure is soft I will hold Coreg will give her slow IV fluids 1 L cautiously since patient has a history of diastolic CHF and has moderate left pleural effusion.  #3 depression/anxiety continue Cymbalta and Lexapro and Xanax.  Per daughter she is receiving Lexapro 5 mg daily not to 2.5 mg daily.  She has also decreased the dose of Xanax to twice a day.  I will hold Neurontin since her mental status is still not clear.  #4 history of CAD/NSTEMI -yesterday patient  complained of bandlike sensation around her chest with chest pressure.  Troponin is 17 and 20.  EKG reviewed by me shows no acute changes but show some T wave abnormalities.  I will trend troponin.  Continue Plavix  #5 history of stroke continue Plavix  #6 hypothyroidism continue Synthroid  #7 glaucoma continue Xalatan eyedrops  #8 leukocytosis likely secondary to infectious process.  She is also on steroids daily.  #9 DNR on admit  #10 GERD patient is on Protonix 40 mg twice a day.  Daughter reports that she has severe GERD and she needs to stay on twice a day of Protonix.  #11 urinary incontinence new check UA and urine culture  Severity of Illness: The appropriate patient status for this patient is INPATIENT. Inpatient status is judged to be reasonable and necessary in order to provide the required intensity of service to ensure the patient's safety. The patient's presenting symptoms, physical exam findings, and initial radiographic and laboratory data in the context of their chronic comorbidities is felt to place them at high risk for further clinical deterioration. Furthermore, it is not anticipated that the patient will be medically stable for discharge from the hospital within 2 midnights of admission. The following factors support the patient status of inpatient.   " The patient's presenting symptoms include shortness of breath cough " The worrisome physical exam findings include hypoxia decreased mental status " The initial radiographic and laboratory data are worrisome because of left lower lobe pneumonia and pleural effusion " The chronic co-morbidities include bronchiectasis, CAD, CHF, stroke.   * I certify that at the point of admission it is my clinical judgment that the patient will require inpatient hospital care spanning beyond 2 midnights from the point of admission due to high intensity of service, high risk for further deterioration and high frequency of surveillance  required.*   Estimated body mass index is 24.03 kg/m as calculated from the following:   Height as of this encounter: _0  (1.626 m).   Weight as of this encounter: 63.5 kg.   DVT prophylaxis: Lovenox Code Status: DO NOT RESUSCITATE Family Communication: Discussed with her daughter who was a respiratory therapist at [facility] 2 years ago. Disposition Plan: Pending clinical improvement Consults called: informed dr byrum of patients admit Admission status: Inpatient   Georgette Shell MD [facility]  If 7PM-7AM, please contact night-coverage [email or web address] Password [facility]  12/16/2018, 2:14 PM

## 2018-12-16 NOTE — ED Notes (Signed)
Hospitalist at bedside 

## 2018-12-16 NOTE — Patient Instructions (Signed)
Advised ED evaluation by EMS

## 2018-12-16 NOTE — Progress Notes (Signed)
Virtual Visit via Video Note  I connected with Sarah [name] on 12/16/18 at  9:15 AM EST by a video enabled telemedicine application and verified that I am speaking with the correct person using two identifiers.  Location: Patient: Home Provider: Office   I discussed the limitations of evaluation and management by telemedicine and the availability of in person appointments. The patient expressed understanding and agreed to proceed.  History of Present Illness: 85 year old female, never smoked. PMH significant for bronchiectasis, asthma, HTN, NSTEMI, stroke, chronic diastolic CHF. Patient of Dr. Lamonte [name], last seen on 10/08/18. Maintained on Omnicef and Azithromycin alternating during the first week of each month. Continues chronic prednisone 10mg  daily and uses chest vest.   12/16/2018 Patient contacted today for acute virtual visit. Daughter accompanied on video call. States that her mother was not feeling well this morning around 6am, she came over at 7am and with the nurse's help they were able to get her washed up and into chair/sofa. When getting her mother out of bed the patient's lowest oxygen saturation was 80% on room air but daughter states that it quickly recovered. Daughter states that her mother sounds more congested this morning in particular with rattling in her chest. She refilled her Azithromycin yesterday that she takes once a month and is due to be taken on 12/20/18.    Observations/Objective:  - While on video visit oxygen saturation in mid 70-80s on room air - Patient appeared lethargic and pale   Assessment and Plan:  Acute respiratory failure with hypoxia: - Possible mucus plugging or aspiration  - Noted oxygen desaturation to 70-80s on room air  - Advised ED evaluation by EMS - Notified Dr. Lamonte [name]     Acute exacerbation of bronchiectasis: - Maintained on alternating Omicef and Azithromycin first week of each month - Continues Prednsione 10mg  daily - Continues  Albuterol nebulizer q6 hours prn sob/wheezing (in conjunction with chest vest daily)  - Received influenza vaccine in September 2020  Follow Up Instructions: - Needs follow-up after ED/hospital stay    I discussed the assessment and treatment plan with the patient. The patient was provided an opportunity to ask questions and all were answered. The patient agreed with the plan and demonstrated an understanding of the instructions.   The patient was advised to call back or seek an in-person evaluation if the symptoms worsen or if the condition fails to improve as anticipated.  I provided 16 minutes of non-face-to-face time during this encounter.   Sarah Ehrich, NP

## 2018-12-16 NOTE — ED Notes (Signed)
Pure wick has been placed. Suction set to 45mmHg.  

## 2018-12-17 DIAGNOSIS — J189 Pneumonia, unspecified organism: Secondary | ICD-10-CM

## 2018-12-17 LAB — CBC
HCT: 38.3 % (ref 36.0–46.0)
Hemoglobin: 10.5 g/dL — ABNORMAL LOW (ref 12.0–15.0)
MCH: 25.6 pg — ABNORMAL LOW (ref 26.0–34.0)
MCHC: 27.4 g/dL — ABNORMAL LOW (ref 30.0–36.0)
MCV: 93.4 fL (ref 80.0–100.0)
Platelets: 310 10*3/uL (ref 150–400)
RBC: 4.1 MIL/uL (ref 3.87–5.11)
RDW: 19.7 % — ABNORMAL HIGH (ref 11.5–15.5)
WBC: 11.9 10*3/uL — ABNORMAL HIGH (ref 4.0–10.5)
nRBC: 0 % (ref 0.0–0.2)

## 2018-12-17 LAB — MAGNESIUM: Magnesium: 2 mg/dL (ref 1.7–2.4)

## 2018-12-17 LAB — COMPREHENSIVE METABOLIC PANEL
ALT: 14 U/L (ref 0–44)
AST: 21 U/L (ref 15–41)
Albumin: 2.9 g/dL — ABNORMAL LOW (ref 3.5–5.0)
Alkaline Phosphatase: 52 U/L (ref 38–126)
Anion gap: 12 (ref 5–15)
BUN: 14 mg/dL (ref 8–23)
CO2: 28 mmol/L (ref 22–32)
Calcium: 8.5 mg/dL — ABNORMAL LOW (ref 8.9–10.3)
Chloride: 97 mmol/L — ABNORMAL LOW (ref 98–111)
Creatinine, Ser: 0.78 mg/dL (ref 0.44–1.00)
GFR calc Af Amer: >60 mL/min (ref >60)
GFR calc non Af Amer: >60 mL/min (ref >60)
Glucose, Bld: 142 mg/dL — ABNORMAL HIGH (ref 70–99)
Potassium: 4.4 mmol/L (ref 3.5–5.1)
Sodium: 137 mmol/L (ref 135–145)
Total Bilirubin: 0.4 mg/dL (ref 0.3–1.2)
Total Protein: 6.4 g/dL — ABNORMAL LOW (ref 6.5–8.1)

## 2018-12-17 LAB — MRSA PCR SCREENING: MRSA by PCR: NEGATIVE

## 2018-12-17 MED ORDER — BISACODYL 10 MG RE SUPP: 10.0000 mg | Freq: Every day | RECTAL | Status: DC | PRN

## 2018-12-17 MED ORDER — GUAIFENESIN 100 MG/5ML PO SOLN
5.0000 mL | ORAL | Status: DC | PRN
  Administered 2018-12-17 – 2018-12-19 (×2): 100 mg via ORAL
  Filled 2018-12-17 (×2): qty 10

## 2018-12-17 MED ORDER — SODIUM CHLORIDE 3 % IN NEBU
4.0000 mL | INHALATION_SOLUTION | Freq: Every day | RESPIRATORY_TRACT | Status: AC
  Administered 2018-12-17 – 2018-12-19 (×3): 4 mL via RESPIRATORY_TRACT
  Filled 2018-12-17 (×3): qty 4

## 2018-12-17 MED ORDER — ACETAMINOPHEN 325 MG PO TABS
650.0000 mg | ORAL_TABLET | Freq: Four times a day (QID) | ORAL | Status: DC | PRN
  Administered 2018-12-17 – 2018-12-21 (×12): 650 mg via ORAL
  Filled 2018-12-17 (×12): qty 2

## 2018-12-17 MED ORDER — SODIUM CHLORIDE 0.9 % IV SOLN
2.0000 g | Freq: Two times a day (BID) | INTRAVENOUS | Status: DC
  Administered 2018-12-17 – 2018-12-23 (×12): 2 g via INTRAVENOUS
  Filled 2018-12-17 (×13): qty 2

## 2018-12-17 MED ORDER — LACTATED RINGERS IV SOLN
INTRAVENOUS | Status: DC
  Administered 2018-12-17 – 2018-12-20 (×4): via INTRAVENOUS

## 2018-12-17 MED ORDER — ORAL CARE MOUTH RINSE
15.0000 mL | Freq: Two times a day (BID) | OROMUCOSAL | Status: DC
  Administered 2018-12-17 – 2018-12-23 (×11): 15 mL via OROMUCOSAL

## 2018-12-17 MED ORDER — ALPRAZOLAM 0.25 MG PO TABS
0.2500 mg | ORAL_TABLET | Freq: Two times a day (BID) | ORAL | Status: DC
  Administered 2018-12-17 – 2018-12-23 (×12): 0.25 mg via ORAL
  Filled 2018-12-17 (×12): qty 1

## 2018-12-17 MED ORDER — SENNA 8.6 MG PO TABS
1.0000 | ORAL_TABLET | Freq: Every day | ORAL | Status: DC
  Administered 2018-12-17 – 2018-12-23 (×6): 8.6 mg via ORAL
  Filled 2018-12-17 (×6): qty 1

## 2018-12-17 MED ORDER — BISACODYL 5 MG PO TBEC
5.0000 mg | DELAYED_RELEASE_TABLET | Freq: Every day | ORAL | Status: DC | PRN
  Administered 2018-12-17 – 2018-12-21 (×2): 5 mg via ORAL
  Filled 2018-12-17 (×2): qty 1

## 2018-12-17 NOTE — Consult Note (Signed)
NAME:  Sarah [name], MRN:  [id number], DOB:  1921/09/05, LOS: 1 ADMISSION DATE:  12/16/2018, CONSULTATION DATE:  12/3 REFERRING MD:  Sarah [name], CHIEF COMPLAINT:  Pneumonia and acute on chronic dyspnea    Brief History   85 year old white female followed in the outpatient setting for btx. Presents on 12/2 w/ pneumonia and resultant respiratory failure   History of present illness   85 year old female patient with history as mentioned below presents to the emergency room on 12/2 after a telephone visit in our office.  The patient apparently had been in relatively normal state of health up until the a.m. of 12/2 when she reported to her daughter she was not feeling well.  She is noted to have exertional hypoxia with saturations dropping to 80% on room air, daughter noted her to be more congested with increased chest rattling, she had a phone telephone visit for this.  While on video chat oxygen saturation rate was 70 to 80% on room air, she appeared lethargic and weak.  She was advised to go to the emergency room.  Past Medical History  Bronchiectasis: On alternating Omnicef and a azithromycin on first week of each month, also baseline prednisone 10 mg and chest vest, chronic diastolic heart failure, prior stroke, asthma, hypertension, prior non-ST elevation MI, chronic anxiety.   Significant Hospital Events   12/2 admitted. Started on rocephin and azithro 12/3 rocephin changed to cefepime   Consults:  PCCM consulted and assumed care 12/3  Procedures:    Significant Diagnostic Tests:    Micro Data:   BCx2 12/2>>> MRSA screen 12/2: neg COVID 12/2: neg   Antimicrobials:   azith 12/2>>> Rocephin 12/2>>12/3 12/3 cefepime>>>  Interim history/subjective:   No distress. Breathing comfortably.   Objective   Blood pressure (Abnormal) 160/84, pulse 98, temperature 97.8 F (36.6 C), temperature source Oral, resp. rate 18, height _0  (1.626 m), weight 63.5 kg, last menstrual period  01/14/1970, SpO2 93 %.        Intake/Output Summary (Last 24 hours) at 12/17/2018 1316 Last data filed at 12/17/2018 0700 Gross per 24 hour  Intake 1242.5 ml  Output 25 ml  Net 1217.5 ml   Filed Weights   12/16/18 1029  Weight: 63.5 kg    Examination: General: 85 year old white female resting in bed. No acute distress  HENT: NCAT no JVD MMM Lungs: crackles left base. No acute distress. No accessory use  Cardiovascular: tachy RRR  Abdomen: soft not tender  Extremities: warm and dry  Neuro: awake, slightly confused  GU: voiding   Resolved Hospital Problem list     Assessment & Plan:   Acute on chronic hypoxic respiratory failure in setting of CAP superimposed on underlying bronchiectasis.  -of note she has prior pseudomonas in sputum 2018 -pcxr personally reviewed: new Left lower lobe consolidative airspace disease Plan Cont supplemental oxygen w/ pulse ox goal > 90 Will add daily hypertonic saline neb Add flutter  Cont abx: day 2/5 azith and day 1 cefepime Am cxr 12/4  Leukocytosis 2/2 above -improving Plan Trend cbc  Essential Hypertensive; h/o CAD, NSTEMI, diastolic HF and stroke Plan Cont plavix  Acute encephalopathic w/ H/o depression and anxiety  Plan Cont cymbalta, lexapro and xanax  (changed xanax to 1/2 dosing) Neurontin held   GERD Plan Cont home meds  hypothyroidism  Plan Cont synthroid    Glaucoma  Plan Cont Xalatan drops  Best practice:  Diet: reg  Pain/Anxiety/Delirium protocol (if indicated): NA VAP protocol (  if indicated): NA DVT prophylaxis: LMWH GI prophylaxis: PPI Glucose control: n/a Mobility: NR Code Status: DNR Family Communication: updated  Disposition: cont supportive care -->pccm will assume primary care   Labs   CBC: Recent Labs  Lab 12/16/18 1101 12/17/18 0414  WBC 15.6* 11.9*  NEUTROABS 12.8*  --   HGB 11.4* 10.5*  HCT 41.6 38.3  MCV 93.5 93.4  PLT 272 166    Basic Metabolic Panel: Recent Labs  Lab  12/16/18 1101 12/17/18 0414  NA 139 137  K 4.0 4.4  CL 94* 97*  CO2 33* 28  GLUCOSE 119* 142*  BUN 13 14  CREATININE 0.96 0.78  CALCIUM 8.8* 8.5*  MG  --  2.0   GFR: Estimated Creatinine Clearance: 36.3 mL/min (by C-G formula based on SCr of 0.78 mg/dL). Recent Labs  Lab 12/16/18 1101 12/17/18 0414  WBC 15.6* 11.9*    Liver Function Tests: Recent Labs  Lab 12/16/18 1101 12/17/18 0414  AST 21 21  ALT 14 14  ALKPHOS 58 52  BILITOT 0.6 0.4  PROT 6.7 6.4*  ALBUMIN 3.3* 2.9*   No results for input(s): LIPASE, AMYLASE in the last 168 hours. No results for input(s): AMMONIA in the last 168 hours.  ABG    Component Value Date/Time   PHART 7.380 12/16/2018 1521   PCO2ART 62.5 (H) 12/16/2018 1521   PO2ART 85.9 12/16/2018 1521   HCO3 36.2 (H) 12/16/2018 1521   TCO2 24 07/08/2014 1009   O2SAT 95.4 12/16/2018 1521     Coagulation Profile: No results for input(s): INR, PROTIME in the last 168 hours.  Cardiac Enzymes: No results for input(s): CKTOTAL, CKMB, CKMBINDEX, TROPONINI in the last 168 hours.  HbA1C: Hgb A1c MFr Bld  Date/Time Value Ref Range Status  07/09/2014 05:51 AM 6.0 (H) 4.8 - 5.6 % Final    Comment:    (NOTE)         Pre-diabetes: 5.7 - 6.4         Diabetes: >6.4         Glycemic control for adults with diabetes: <7.0   09/06/2010 02:25 AM 6.1 (H) <5.7 % Final    Comment:    (NOTE)                                                                       According to the ADA Clinical Practice Recommendations for 2011, when HbA1c is used as a screening test:  >=6.5%   Diagnostic of Diabetes Mellitus           (if abnormal result is confirmed) 5.7-6.4%   Increased risk of developing Diabetes Mellitus References:Diagnosis and Classification of Diabetes Mellitus,Diabetes AYTK,1601,09(NATFT 1):S62-S69 and Standards of Medical Care in         Diabetes - 2011,Diabetes DDUK,0254,27 (Suppl 1):S11-S61.    CBG: No results for input(s): GLUCAP in the  last 168 hours.  Review of Systems:   Not able   Past Medical History  She,  has a past medical history of Allergic rhinitis, Anxiety, Arthritis, Asthma, Bronchiectasis, Chest pain at rest, Daily headache, Depression, Diastolic dysfunction, GERD (gastroesophageal reflux disease), H/O hiatal hernia, H/O Legionnaire's disease (11/1976), History of blood transfusion (1972), Hypercholesteremia, Hypertension, Hypothyroidism, Idiopathic peripheral neuropathy, MAC (  mycobacterium avium-intracellulare complex), Migraines, NSTEMI (non-ST elevated myocardial infarction) ([place]), Osteopenia (02/2011), Pneumonia, Pollen allergies, Shortness of breath, and Stroke ([place]) (06/2014).   Surgical History    Past Surgical History:  Procedure Laterality Date  . APPENDECTOMY    . CARDIAC CATHETERIZATION  August 2012   Normal coronaries.  Marland Kitchen DILATION AND CURETTAGE OF UTERUS    . TONSILLECTOMY  1930  . TOTAL ABDOMINAL HYSTERECTOMY W/ BILATERAL SALPINGOOPHORECTOMY  1972   leiomyomata, menorrhagia     Social History   reports that she has never smoked. She has never used smokeless tobacco. She reports current alcohol use of about 2.0 standard drinks of alcohol per week. She reports that she does not use drugs.   Family History   Her family history includes Cancer in her brother; Hypertension in her mother; Osteoarthritis in her paternal aunt; Stroke in her mother.   Allergies Allergies  Allergen Reactions  . Codeine Anaphylaxis  . Hydrocodone Nausea And Vomiting and Other (See Comments)    SYNCOPE AND BRADYCARDIA  . Isosorbide Nitrate Other (See Comments)    BRADYCARDIA  . Oxycodone Palpitations    Rapid heart beat  . Clarithromycin Nausea And Vomiting    Has patient had a PCN reaction causing immediate rash, facial/tongue/throat swelling, SOB or lightheadedness with hypotension: Unknown Has patient had a PCN reaction causing severe rash involving mucus membranes or skin necrosis: Unknown Has patient had a  PCN reaction that required hospitalization: Unknown Has patient had a PCN reaction occurring within the last 10 years: Unknown If all of the above answers are "NO", then may proceed with Cephalosporin use.   . Fiorinal [Butalbital-Aspirin-Caffeine] Swelling and Other (See Comments)    Facial swelling   . Levofloxacin Nausea And Vomiting and Other (See Comments)    SYNCOPE ALSO  . Augmentin [Amoxicillin-Pot Clavulanate] Diarrhea  . Doxycycline     Sweats and aches     Home Medications  Prior to Admission medications   Medication Sig Start Date End Date Taking? Authorizing Provider  albuterol (PROVENTIL) (2.5 MG/3ML) 0.083% nebulizer solution USE 1 VIAL VIA NEBULIZER TWICE DAILY Patient taking differently: Take 2.5 mg by nebulization every 6 (six) hours as needed for wheezing or shortness of breath.  08/04/18  Yes Byrum, Rose Fillers, MD  ALPHAGAN P 0.1 % SOLN Place 1 drop into the left eye every 8 (eight) hours.  11/26/18  Yes [provider]  ALPRAZolam Duanne Moron) 0.5 MG tablet Take 1 tablet by mouth 2 (two) times daily.  04/06/18  Yes [name], MD  calcium carbonate (TUMS - DOSED IN MG ELEMENTAL CALCIUM) 500 MG chewable tablet Chew 1 tablet by mouth every 8 (eight) hours as needed for indigestion or heartburn.   Yes [provider]  carvedilol (COREG) 6.25 MG tablet TAKE 1 TABLET(6.25 MG) BY MOUTH TWICE DAILY Patient taking differently: Take 6.25 mg by mouth 2 (two) times daily with a meal.  06/24/18  Yes Nahser, Wonda Cheng, MD  clopidogrel (PLAVIX) 75 MG tablet TAKE 1 TABLET(75 MG) BY MOUTH DAILY Patient taking differently: Take 75 mg by mouth daily.  10/08/18  Yes Nahser, Wonda Cheng, MD  DULoxetine (CYMBALTA) 20 MG capsule Take 20 mg by mouth 3 (three) times daily. 06/14/14  Yes [provider]  escitalopram (LEXAPRO) 5 MG tablet Take 5 mg by mouth daily.    Yes [provider]  gabapentin (NEURONTIN) 100 MG capsule Take 300 mg by mouth 3 (three) times daily.     Yes [provider]  Iron-FA-B Cmp-C-Biot-Probiotic (FUSION PLUS PO) Take 1 tablet by mouth daily.   Yes [provider]  latanoprost (XALATAN) 0.005 % ophthalmic solution Place 1 drop into both eyes at bedtime.  08/30/14  Yes [provider]  levothyroxine (SYNTHROID, LEVOTHROID) 50 MCG tablet Take 50 mcg by mouth daily.     Yes [provider]  pantoprazole (PROTONIX) 40 MG tablet Take 40 mg by mouth 2 (two) times daily.   Yes [provider]  predniSONE (DELTASONE) 10 MG tablet TAKE 1 TABLET(10 MG) BY MOUTH DAILY WITH BREAKFAST Patient taking differently: Take 10 mg by mouth daily with breakfast.  11/16/18  Yes Byrum, Rose Fillers, MD  Sennosides (SENOKOT PO) Take 1 tablet by mouth daily as needed (constipation).    Yes [provider]  azithromycin (ZITHROMAX) 250 MG tablet Take 1 tablet (250 mg total) by mouth daily. Every other month 12/15/18   Collene Gobble, MD  cefdinir (OMNICEF) 300 MG capsule Take 300 mg by mouth 2 (two) times daily. Every other month    [provider]  mupirocin ointment (BACTROBAN) 2 % Apply to toes daily Patient not taking: Reported on 12/16/2018 06/15/18   Marzetta Board, DPM     Erick Colace ACNP-BC [place] Pager # [phone] OR # [phone] if no answer

## 2018-12-17 NOTE — Progress Notes (Signed)
Pharmacy Antibiotic Note  Sarah [name] is a 85 y.o. female admitted on 12/16/2018 with HCAP. Hx bronchiectasis and chronically on alternating cefdinir and azithromycin first week of every month with prednisone 10mg  daily. CXR concerning for pneumonia and was initiated on azithromycin and ceftriaxone. Prior pseudomonas in sputum 2018. Ceftriaxone discontinued on 12/3 and Pharmacy consulted for cefepime dosing.  Plan: - cefepime 2 g IV q12h  __________________________________  Height: 5\' 4"  (162.6 cm) Weight: 140 lb (63.5 kg) IBW/kg (Calculated) : 54.7  Temp (24hrs), Avg:97.8 F (36.6 C), Min:97.5 F (36.4 C), Max:98.1 F (36.7 C)  Recent Labs  Lab 12/16/18 1101 12/17/18 0414  WBC 15.6* 11.9*  CREATININE 0.96 0.78    Estimated Creatinine Clearance: 36.3 mL/min (by C-G formula based on SCr of 0.78 mg/dL).    Allergies  Allergen Reactions  . Codeine Anaphylaxis  . Hydrocodone Nausea And Vomiting and Other (See Comments)    SYNCOPE AND BRADYCARDIA  . Isosorbide Nitrate Other (See Comments)    BRADYCARDIA  . Oxycodone Palpitations    Rapid heart beat  . Clarithromycin Nausea And Vomiting    Has patient had a PCN reaction causing immediate rash, facial/tongue/throat swelling, SOB or lightheadedness with hypotension: Unknown Has patient had a PCN reaction causing severe rash involving mucus membranes or skin necrosis: Unknown Has patient had a PCN reaction that required hospitalization: Unknown Has patient had a PCN reaction occurring within the last 10 years: Unknown If all of the above answers are "NO", then may proceed with Cephalosporin use.   . Fiorinal [Butalbital-Aspirin-Caffeine] Swelling and Other (See Comments)    Facial swelling   . Levofloxacin Nausea And Vomiting and Other (See Comments)    SYNCOPE ALSO  . Augmentin [Amoxicillin-Pot Clavulanate] Diarrhea  . Doxycycline     Sweats and aches   Antimicrobials this admission:  12/2 azithromycin>> 12/2 CTX>>  12/3 12/3 cefepime >>  Microbiology results:  12/2 Bcx x 2:  pending 12/2 MRSA PCR: neg 12/2 Urine: pending    Thank you for allowing pharmacy to be a part of this patient's care.  Efraim Kaufmann 12/17/2018 2:35 PM

## 2018-12-17 NOTE — Progress Notes (Signed)
MD on call made aware of  runs of SVT with HR of 175. Non sustained. Pt asymptomatic. Order for magnesium received. Lab made aware.

## 2018-12-17 NOTE — Evaluation (Signed)
Clinical/Bedside Swallow Evaluation Patient Details  Name: Sarah [name] MRN: [id number] Date of Birth: 01/10/1922  Today's Date: 12/17/2018 Time: SLP Start Time (ACUTE ONLY): 1440 SLP Stop Time (ACUTE ONLY): 1505 SLP Time Calculation (min) (ACUTE ONLY): 25 min  Past Medical History:  Past Medical History:  Diagnosis Date  . Allergic rhinitis   . Anxiety    Anxiety attacks  . Arthritis    "a little; in my back and hands" (04/15/2012)  . Asthma   . Bronchiectasis    with history of Legionaires with chronic rales/rhonchi  . Chest pain at rest   . Daily headache    "over the last week" 04/15/2012   . Depression   . Diastolic dysfunction    a. per echo 08/2010 with normal LV function;  b. 06/2011 Echo: EF 65-70%  . GERD (gastroesophageal reflux disease)    "just recently" (04/15/2012)  . H/O hiatal hernia   . H/O Legionnaire's disease 11/1976  . History of blood transfusion 1972   "w/hysterectomy" (04/15/2012)  . Hypercholesteremia   . Hypertension    Negative renal duplex in December of 2012  . Hypothyroidism   . Idiopathic peripheral neuropathy   . MAC (mycobacterium avium-intracellulare complex)   . Migraines    "outgrew them" (04/15/2012)  . NSTEMI (non-ST elevated myocardial infarction) ([place])    a. Normal coronaries per cath August 2012 and negative CT angio for PE;  b. 06/2011 Repeat admission w/ chest pain and elevated troponin's, CTA Chest w/o PE  . Osteopenia 02/2011   t score - 2.1  . Pneumonia    "recurrent" (04/15/2012)  . Pollen allergies   . Shortness of breath    "sometimes just lying down" (04/15/2012)  . Stroke [facility]) 06/2014   cerebellum   Past Surgical History:  Past Surgical History:  Procedure Laterality Date  . APPENDECTOMY    . CARDIAC CATHETERIZATION  August 2012   Normal coronaries.  Marland Kitchen DILATION AND CURETTAGE OF UTERUS    . TONSILLECTOMY  1930  . TOTAL ABDOMINAL HYSTERECTOMY W/ BILATERAL SALPINGOOPHORECTOMY  1972   leiomyomata, menorrhagia   HPI:  85  y.o. female admitted with complaints of shortness of breath cough with no fever. PMH:  GERD, bronchiectasis, chronic diastolic CHF, cerebellar stroke (2016), asthma, hypertension, NSTEMI, anxiety attacks, recurrent PNA (2014). CXR = Left midlung and left base airspace consolidation with moderate left pleural effusion. Imaging findings concerning for pneumonia.   Assessment / Plan / Recommendation Clinical Impression  Pt presents with missing dentition, but otherwise adequate oral strength and function. Pt reports difficulty swallowing, indicating she has to be careful because food gets stuck. No overt s/s aspiration observed following trials of thin liquid, puree or solid textures. Extended oral prep noted of solid textures, which increases risk of aspiration with fatigue. Recommend dys 2 diet for energy conservation, thin liquids, and adherence to strategies for management of esophageal dysmotility. Recommend consideration of regular barium swallow to evaluate esophageal motility, if within pt/family wishes. SLP will follow acutely to assess diet tolerance and continue education.   SLP Visit Diagnosis: Dysphagia, unspecified (R13.10)    Aspiration Risk  Mild aspiration risk    Diet Recommendation Dysphagia 2 (Fine chop);Thin liquid   Liquid Administration via: Cup;Straw Medication Administration: (as tolerated) Supervision: Patient able to self feed;Staff to assist with self feeding;Intermittent supervision to cue for compensatory strategies Compensations: Slow rate;Small sips/bites;Follow solids with liquid;Minimize environmental distractions Postural Changes: Seated upright at 90 degrees;Remain upright for at least 30 minutes after po intake  Other  Recommendations Recommended Consults: Consider esophageal assessment Oral Care Recommendations: Oral care QID   Follow up Recommendations 24 hour supervision/assistance      Frequency and Duration min 1 x/week  1 week;2 weeks        Prognosis Prognosis for Safe Diet Advancement: Fair      Swallow Study   General Date of Onset: 12/16/18 HPI: 85 y.o. female admitted with complaints of shortness of breath cough with no fever. PMH:  GERD, bronchiectasis, chronic diastolic CHF, cerebellar stroke (2016), asthma, hypertension, NSTEMI, anxiety attacks, recurrent PNA (2014). CXR = Left midlung and left base airspace consolidation with moderate left pleural effusion. Imaging findings concerning for pneumonia. Type of Study: Bedside Swallow Evaluation Previous Swallow Assessment: none found Diet Prior to this Study: Dysphagia 1 (puree);Thin liquids Temperature Spikes Noted: No Respiratory Status: Nasal cannula History of Recent Intubation: No Behavior/Cognition: Alert;Cooperative;Pleasant mood Oral Cavity Assessment: Within Functional Limits Oral Care Completed by SLP: No Oral Cavity - Dentition: Missing dentition Vision: Functional for self-feeding Self-Feeding Abilities: Able to feed self Patient Positioning: Upright in bed Baseline Vocal Quality: Normal Volitional Cough: Strong Volitional Swallow: Able to elicit    Oral/Motor/Sensory Function Overall Oral Motor/Sensory Function: Within functional limits   Ice Chips Ice chips: Not tested   Thin Liquid Thin Liquid: Within functional limits Presentation: Straw    Nectar Thick Nectar Thick Liquid: Not tested   Honey Thick Honey Thick Liquid: Not tested   Puree Puree: Within functional limits Presentation: Self Fed;Spoon   Solid     Solid: Impaired Oral Phase Impairments: Impaired mastication Oral Phase Functional Implications: Prolonged oral transit     Sarah [name], [place], [place] Pathologist Office: [phone] Pager: [phone]  Shonna Chock 12/17/2018,3:17 PM

## 2018-12-17 NOTE — Progress Notes (Signed)
Discussed with pulmonology NP [name]. Patient is established patient of [name] as an outpatient.  Primarily admitted for acute respiratory failure, pneumonia, pleural effusion. NP Collier [name] stated that patient will be taken under primary service [name]. Hospitalist service will sign off.

## 2018-12-17 NOTE — Evaluation (Signed)
Physical Therapy Evaluation Patient Details Name: Sarah [name] MRN: [id number] DOB: 26-Sep-1921 Today's Date: 12/17/2018   History of Present Illness  85 y.o. female with medical history significant of bronchiectasis, chronic diastolic CHF, stroke, asthma, hypertension, NSTEMI, anxiety attacks and admitted for acute on chronic hypoxic respiratory failure in setting of CAP superimposed on underlying bronchiectasis  Clinical Impression  Pt admitted with above diagnosis.  Pt currently with functional limitations due to the deficits listed below (see PT Problem List). Pt will benefit from skilled PT to increase their independence and safety with mobility to allow discharge to the venue listed below.   Pt reports having HHPT 2x/[facility] at [facility].  Pt agreeable to ambulate and likely able to tolerate at least short distance however due to L IV site leaking, only mobilized to recliner.  Pt currently on 2.5 L O2 [place] in room however does not wear oxygen at baseline.     Follow Up Recommendations Home health PT    Equipment Recommendations  None recommended by PT    Recommendations for Other Services       Precautions / Restrictions Precautions Precautions: Fall Precaution Comments: monitor O2      Mobility  Bed Mobility Overal bed mobility: Needs Assistance Bed Mobility: Supine to Sit     Supine to sit: Supervision;HOB elevated     General bed mobility comments: for safety and lines  Transfers Overall transfer level: Needs assistance Equipment used: Rolling walker (2 wheeled) Transfers: Sit to/from [employer] Sit to Stand: Min guard Stand pivot transfers: Min guard       General transfer comment: min/guard for safety, pt with dripping L IV site so only assisted to recliner and notified RN; pt remained on 2.5 L O2 [place]  Ambulation/Gait                Stairs            Wheelchair Mobility    Modified Rankin (Stroke Patients Only)       Balance  Overall balance assessment: Needs assistance         Standing balance support: Bilateral upper extremity supported Standing balance-Leahy Scale: Poor Standing balance comment: reliant on UE support                             Pertinent Vitals/Pain Pain Assessment: No/denies pain    Home Living     Available Help at Discharge: Family;Available PRN/intermittently Type of Home: Independent living facility         Home Equipment: Dan Humphreys - 4 wheels;Cane - single point      Prior Function Level of Independence: Needs assistance   Gait / Transfers Assistance Needed: rollator for ambulation, pt reports she was working with HHPT 2x/week prior to admission           Hand Dominance        Extremity/Trunk Assessment        Lower Extremity Assessment Lower Extremity Assessment: Generalized weakness    Cervical / Trunk Assessment Cervical / Trunk Assessment: Kyphotic  Communication      Cognition Arousal/Alertness: Awake/alert Behavior During Therapy: WFL for tasks assessed/performed Overall Cognitive Status: Within Functional Limits for tasks assessed                                        General Comments  Exercises     Assessment/Plan    PT Assessment Patient needs continued PT services  PT Problem List Decreased strength;Decreased mobility;Decreased activity tolerance;Decreased balance;Decreased knowledge of use of DME       PT Treatment Interventions DME instruction;Therapeutic activities;Gait training;Therapeutic exercise;Stair training;Functional mobility training;Balance training;Patient/family education    PT Goals (Current goals can be found in the Care Plan section)  Acute Rehab PT Goals PT Goal Formulation: With patient Time For Goal Achievement: 12/31/18 Potential to Achieve Goals: Good    Frequency Min 3X/week   Barriers to discharge        Co-evaluation               AM-PAC PT "6 Clicks"  Mobility  Outcome Measure Help needed turning from your back to your side while in a flat bed without using bedrails?: A Little Help needed moving from lying on your back to sitting on the side of a flat bed without using bedrails?: A Little Help needed moving to and from a bed to a chair (including a wheelchair)?: A Little Help needed standing up from a chair using your arms (e.g., wheelchair or bedside chair)?: A Little Help needed to walk in hospital room?: A Little Help needed climbing 3-5 steps with a railing? : A Lot 6 Click Score: 17    End of Session Equipment Utilized During Treatment: Gait belt Activity Tolerance: Patient tolerated treatment well Patient left: in chair;with call bell/phone within reach;with chair alarm set Nurse Communication: Mobility status PT Visit Diagnosis: Difficulty in walking, not elsewhere classified (R26.2);Muscle weakness (generalized) (M62.81)    Time: 6381-7711 PT Time Calculation (min) (ACUTE ONLY): 30 min   Charges:   PT Evaluation $PT Eval Low Complexity: [place], PT, DPT Acute Rehabilitation Services Office: [phone] Pager: [phone]  Trena Platt 12/17/2018, 4:05 PM

## 2018-12-18 ENCOUNTER — Inpatient Hospital Stay (HOSPITAL_COMMUNITY): Payer: Medicare Other

## 2018-12-18 DIAGNOSIS — Z9889 Other specified postprocedural states: Secondary | ICD-10-CM | POA: Diagnosis not present

## 2018-12-18 DIAGNOSIS — J189 Pneumonia, unspecified organism: Secondary | ICD-10-CM | POA: Diagnosis not present

## 2018-12-18 DIAGNOSIS — J90 Pleural effusion, not elsewhere classified: Secondary | ICD-10-CM

## 2018-12-18 LAB — BODY FLUID CELL COUNT WITH DIFFERENTIAL
Eos, Fluid: 1 %
Lymphs, Fluid: 59 %
Monocyte-Macrophage-Serous Fluid: 6 % — ABNORMAL LOW (ref 50–90)
Neutrophil Count, Fluid: 34 % — ABNORMAL HIGH (ref 0–25)
Total Nucleated Cell Count, Fluid: 4112 cu mm — ABNORMAL HIGH (ref 0–1000)

## 2018-12-18 LAB — CHOLESTEROL, TOTAL: Cholesterol: 185 mg/dL (ref 0–200)

## 2018-12-18 LAB — LACTATE DEHYDROGENASE: LDH: 168 U/L (ref 98–192)

## 2018-12-18 LAB — URINE CULTURE

## 2018-12-18 LAB — PROTEIN, PLEURAL OR PERITONEAL FLUID: Total protein, fluid: 3 g/dL

## 2018-12-18 LAB — EXPECTORATED SPUTUM ASSESSMENT W GRAM STAIN, RFLX TO RESP C

## 2018-12-18 LAB — LACTATE DEHYDROGENASE, PLEURAL OR PERITONEAL FLUID: LD, Fluid: 98 U/L — ABNORMAL HIGH (ref 3–23)

## 2018-12-18 LAB — PROTEIN, TOTAL: Total Protein: 6.7 g/dL (ref 6.5–8.1)

## 2018-12-18 MED ORDER — ESCITALOPRAM OXALATE 10 MG PO TABS
5.0000 mg | ORAL_TABLET | Freq: Every day | ORAL | Status: DC
  Administered 2018-12-18 – 2018-12-23 (×6): 5 mg via ORAL
  Filled 2018-12-18 (×6): qty 1

## 2018-12-18 MED ORDER — HYDRALAZINE HCL 20 MG/ML IJ SOLN
10.0000 mg | INTRAMUSCULAR | Status: DC | PRN
  Administered 2018-12-18: 10 mg via INTRAVENOUS
  Filled 2018-12-18: qty 1

## 2018-12-18 MED ORDER — ENOXAPARIN SODIUM 40 MG/0.4ML ~~LOC~~ SOLN
40.0000 mg | SUBCUTANEOUS | Status: DC
  Administered 2018-12-18 – 2018-12-22 (×5): 40 mg via SUBCUTANEOUS
  Filled 2018-12-18 (×5): qty 0.4

## 2018-12-18 MED ORDER — AMLODIPINE BESYLATE 10 MG PO TABS
10.0000 mg | ORAL_TABLET | Freq: Every day | ORAL | Status: DC
  Administered 2018-12-18 – 2018-12-23 (×6): 10 mg via ORAL
  Filled 2018-12-18 (×6): qty 1

## 2018-12-18 MED ORDER — ALPRAZOLAM 0.25 MG PO TABS
0.2500 mg | ORAL_TABLET | Freq: Two times a day (BID) | ORAL | Status: DC | PRN
  Administered 2018-12-18: 0.25 mg via ORAL
  Filled 2018-12-18: qty 1

## 2018-12-18 MED ORDER — ALBUTEROL SULFATE (2.5 MG/3ML) 0.083% IN NEBU
2.5000 mg | INHALATION_SOLUTION | Freq: Two times a day (BID) | RESPIRATORY_TRACT | Status: DC
  Administered 2018-12-19 – 2018-12-23 (×9): 2.5 mg via RESPIRATORY_TRACT
  Filled 2018-12-18 (×9): qty 3

## 2018-12-18 MED ORDER — CARVEDILOL 6.25 MG PO TABS
6.2500 mg | ORAL_TABLET | Freq: Two times a day (BID) | ORAL | Status: DC
  Administered 2018-12-18 – 2018-12-23 (×11): 6.25 mg via ORAL
  Filled 2018-12-18 (×11): qty 1

## 2018-12-18 MED ORDER — KETOROLAC TROMETHAMINE 15 MG/ML IJ SOLN
7.5000 mg | Freq: Four times a day (QID) | INTRAMUSCULAR | Status: AC
  Administered 2018-12-18 – 2018-12-23 (×20): 7.5 mg via INTRAVENOUS
  Filled 2018-12-18 (×20): qty 1

## 2018-12-18 MED ORDER — DULOXETINE HCL 20 MG PO CPEP
20.0000 mg | ORAL_CAPSULE | Freq: Three times a day (TID) | ORAL | Status: DC
  Administered 2018-12-18 – 2018-12-23 (×16): 20 mg via ORAL
  Filled 2018-12-18 (×19): qty 1

## 2018-12-18 NOTE — Progress Notes (Addendum)
NAME:  Sarah [name], MRN:  [id number], DOB:  05-13-1921, LOS: 2 ADMISSION DATE:  12/16/2018, CONSULTATION DATE:  12/3 REFERRING MD:  Dahal, CHIEF COMPLAINT:  Pneumonia and acute on chronic dyspnea    Brief History   85 year old white female followed in the outpatient setting for btx. Presents on 12/2 w/ pneumonia and resultant respiratory failure   Past Medical History  Bronchiectasis: On alternating Omnicef and a azithromycin on first week of each month, also baseline prednisone 10 mg and chest vest, chronic diastolic heart failure, prior stroke, asthma, hypertension, prior non-ST elevation MI, chronic anxiety.  Significant Hospital Events   12/2 admitted. Started on rocephin and azithro 12/3 rocephin changed to cefepime   Consults:  PCCM consulted and assumed care 12/3  Procedures:    Significant Diagnostic Tests:    Micro Data:   BCx2 12/2>>> MRSA screen 12/2: neg COVID 12/2: neg   Antimicrobials:   azith 12/2>>> Rocephin 12/2>>12/3 12/3 cefepime>>>  Interim history/subjective:   Still intermittently confused but she can be reoriented with time  Objective   Blood pressure (Abnormal) 198/108, pulse 98, temperature 97.6 F (36.4 C), temperature source Oral, resp. rate (Abnormal) 23, height 5\' 4"  (1.626 m), weight 63.5 kg, last menstrual period 01/14/1970, SpO2 91 %.        Intake/Output Summary (Last 24 hours) at 12/18/2018 1054 Last data filed at 12/18/2018 1053 Gross per 24 hour  Intake 1524.23 ml  Output 750 ml  Net 774.23 ml  Pulse oximetry as low as 88 on room air, placed back on 1-1/2 L however as not clear if she is desaturating when sleeping Filed Weights   12/16/18 1029  Weight: 63.5 kg    Examination: General this is a very pleasant but somewhat confused 85 year old white female she is resting in bed and in no acute distress today  HEENT normocephalic atraumatic no jugular venous distention  Pulmonary crackles bilateral bases somewhat diminished  on the left side she does have a small to moderate left-sided pleural effusion (see separate progress note) Cardiac: Regular rate and rhythm she has been hypertensive Abdomen: Soft nontender Extremities: Warm and dry Neuro awake, remains intermittently confused but able to be reoriented moves all extremities has no focal deficits  Resolved Hospital Problem list     Assessment & Plan:   Acute on chronic hypoxic respiratory failure in setting of CAP with left-sided pleural effusion, superimposed on underlying bronchiectasis.  -of note she has prior pseudomonas in sputum 2018 -Portable chest x-ray personally reviewed: This continues to demonstrate left lower lobe consolidation Plan Continuing supplemental oxygen with pulse oximetry goal is greater than 90%  Continue daily hypertonic saline nebulizers  Flutter valve  Bronchodilators  Day #3 of 5 a azithromycin day #2 of cefepime with date to be determined of therapy  Repeat x-ray 12/5  Proceed with therapeutic and diagnostic left thoracentesis later today   Leukocytosis 2/2 above -improving Plan Trend CBC Continue antibiotics  Essential Hypertension -->sig hypertension here in hospital.  h/o CAD, NSTEMI, diastolic HF and stroke Plan Plavix added back added back Coreg goal sbp <180 PRN hydralazine  Add coreg   Acute encephalopathic w/ H/o depression and anxiety  Plan Continuing Cymbalta, and Lexapro Xanax at half dose Neurontin held due to confusion Encourage normal day night pattern lights on during the day, blinds up during day   GERD Plan Continuing home meds  hypothyroidism  Plan Continuing Synthroid  Glaucoma  Plan Continuing eyedrops Best practice:  Diet: reg  Pain/Anxiety/Delirium  protocol (if indicated): NA VAP protocol (if indicated): NA DVT prophylaxis: LMWH GI prophylaxis: PPI Glucose control: n/a Mobility: NR Code Status: DNR Family Communication: updated  Disposition: cont supportive care -->  continue current therapy as outlined above  Simonne Martinet ACNP-BC [facility] Pulmonary/Critical Care Pager # [phone] OR # [phone] if no answer

## 2018-12-18 NOTE — Procedures (Signed)
Thoracentesis Procedure Note  Pre-operative Diagnosis: pleural fluid   Post-operative Diagnosis: same  Indications: evaluation of pleural fluid  Real time [place] was used to ID the pleural fluid for needle placement  Procedure Details  Consent: Informed consent was obtained. Risks of the procedure were discussed including: infection, bleeding, pain, pneumothorax.  Under sterile conditions the patient was positioned. Betadine solution and sterile drapes were utilized.  1% buffered lidocaine was used to anesthetize the 8th to 9th rib space. Fluid was obtained without any difficulties and minimal blood loss.  A dressing was applied to the wound and wound care instructions were provided.   Findings 440 ml of cloudy pleural fluid was obtained. A sample was sent to Pathology for cytogenetics, flow, and cell counts, as well as for infection analysis.  Complications:  None; patient tolerated the procedure well.          Condition: stable  Plan A follow up chest x-ray was ordered. Bed Rest for 0 hours.  Erick Colace ACNP-BC [place] Pager # [phone] OR # [phone] if no answer

## 2018-12-18 NOTE — Procedures (Signed)
Bedside ultrasound of left chest  This demonstrates small to moderate free flowing left pleural effusion. See picture below     Erick Colace ACNP-BC [place] Pager # [phone] OR # [phone] if no answer

## 2018-12-18 NOTE — Progress Notes (Signed)
Pharmacy Antibiotic Note  Sarah [name] is a 85 y.o. female admitted on 12/16/2018 with HCAP. Hx bronchiectasis and chronically on alternating cefdinir and azithromycin first week of every month with prednisone 10mg  daily. CXR concerning for pneumonia and was initiated on azithromycin and ceftriaxone. Prior pseudomonas in sputum 2018. Ceftriaxone discontinued on 12/3 and Pharmacy consulted for cefepime dosing.  Plan: - Continue Cefepime 2g IV q12h - Continue Azithromycin 500mg  IV q24h per MD - Monitor renal function, cultures, clinical course  __________________________________  Height: 5\' 4"  (162.6 cm) Weight: 140 lb (63.5 kg) IBW/kg (Calculated) : 54.7  Temp (24hrs), Avg:97.9 F (36.6 C), Min:97.6 F (36.4 C), Max:98.1 F (36.7 C)  Recent Labs  Lab 12/16/18 1101 12/17/18 0414  WBC 15.6* 11.9*  CREATININE 0.96 0.78    Estimated Creatinine Clearance: 36.3 mL/min (by C-G formula based on SCr of 0.78 mg/dL).    Allergies  Allergen Reactions  . Codeine Anaphylaxis  . Hydrocodone Nausea And Vomiting and Other (See Comments)    SYNCOPE AND BRADYCARDIA  . Isosorbide Nitrate Other (See Comments)    BRADYCARDIA  . Oxycodone Palpitations    Rapid heart beat  . Clarithromycin Nausea And Vomiting    Has patient had a PCN reaction causing immediate rash, facial/tongue/throat swelling, SOB or lightheadedness with hypotension: Unknown Has patient had a PCN reaction causing severe rash involving mucus membranes or skin necrosis: Unknown Has patient had a PCN reaction that required hospitalization: Unknown Has patient had a PCN reaction occurring within the last 10 years: Unknown If all of the above answers are "NO", then may proceed with Cephalosporin use.   . Fiorinal [Butalbital-Aspirin-Caffeine] Swelling and Other (See Comments)    Facial swelling   . Levofloxacin Nausea And Vomiting and Other (See Comments)    SYNCOPE ALSO  . Augmentin [Amoxicillin-Pot Clavulanate] Diarrhea  .  Doxycycline     Sweats and aches   Antimicrobials this admission:  12/2 Azithromycin >> 12/2 Ceftriaxone >> 12/3 12/3 Cefepime >>  Microbiology results:  12/2 BCx: NGTD 12/2 UCx: multiple species, suggest recollection  12/2 MRSA PCR: neg 12/2 COVID: neg 12/4 Sputum: sent   Thank you for allowing pharmacy to be a part of this patient's care.    Lindell Spar, PharmD, BCPS Clinical Pharmacist   12/18/2018 12:12 PM

## 2018-12-18 NOTE — Progress Notes (Signed)
Pt just had thoracentesis chest vest not done at this time. Discussed this with family daughter wants chest vest held for a few days for comfort.

## 2018-12-18 NOTE — Progress Notes (Signed)
Vital signs- for thoracentesis procedure

## 2018-12-18 NOTE — Progress Notes (Signed)
PT Cancellation Note  Patient Details Name: Sarah [name] MRN: [id number] DOB: 07/15/1921   Cancelled Treatment:     Pt was getting a thoracentesis. Will try again on Monday.   Excell Seltzer, [place] [facility]

## 2018-12-18 NOTE — Progress Notes (Signed)
[facility] Documentation  Pt is currently a palliative pt of Manufacturing [employer]. Liaison will follow during hospital admission to ensure that pt receives follow up from [facility] palliative team upon discharge.   Please reach out with any questions.   Thank you,  Freddie Breech, RN [facility] Liaison [phone]

## 2018-12-18 NOTE — Progress Notes (Signed)
Patient's BP elevated and reported to attending, Byrum. BP 198/108, MAP 133.

## 2018-12-18 NOTE — Plan of Care (Signed)

## 2018-12-19 DIAGNOSIS — J90 Pleural effusion, not elsewhere classified: Secondary | ICD-10-CM | POA: Diagnosis not present

## 2018-12-19 DIAGNOSIS — J189 Pneumonia, unspecified organism: Secondary | ICD-10-CM | POA: Diagnosis not present

## 2018-12-19 MED ORDER — ONDANSETRON HCL 4 MG/2ML IJ SOLN
4.0000 mg | Freq: Once | INTRAMUSCULAR | Status: AC
  Administered 2018-12-19: 4 mg via INTRAVENOUS
  Filled 2018-12-19: qty 2

## 2018-12-19 NOTE — Plan of Care (Signed)
  Problem: Activity: Goal: Risk for activity intolerance will decrease Outcome: Progressing   

## 2018-12-19 NOTE — Progress Notes (Signed)
Physical Therapy Treatment Patient Details Name: Sarah [name] MRN: [id number] DOB: 1921-05-04 Today's Date: 12/19/2018    History of Present Illness 85 y.o. female with medical history significant of bronchiectasis, chronic diastolic CHF, stroke, asthma, hypertension, NSTEMI, anxiety attacks and admitted for acute on chronic hypoxic respiratory failure in setting of CAP superimposed on underlying bronchiectasis    PT Comments    Pt able to do two SPT, to [name] <> bed > recliner. Her o2 sats were stable throughout session, but c/o tightness in chest at times which MD states would not be unusual. Spoke with daughter regarding need for increased S at home initially.  Although pt moving at min/guard , due to her age, may need increased time to recover. Daughter verbalized understanding. They would like to avoid SNF at this time. Recommend HHPT.  While at hospital, pt would benefit from getting up to recliner for lunch and dinner with nursing staff.   Follow Up Recommendations  Home health PT;Supervision - Intermittent;Supervision for mobility/OOB     Equipment Recommendations  None recommended by PT    Recommendations for Other Services       Precautions / Restrictions Precautions Precautions: Fall Precaution Comments: monitor O2 Restrictions Weight Bearing Restrictions: No    Mobility  Bed Mobility Overal bed mobility: Needs Assistance Bed Mobility: Supine to Sit     Supine to sit: Supervision;Min assist     General bed mobility comments: S for 1st supine > sit and MIN A for 2nd  Transfers Overall transfer level: Needs assistance Equipment used: 1 person hand held assist Transfers: Sit to/from [employer] Sit to Stand: Min guard Stand pivot transfers: Min guard       General transfer comment: Pt transferred to [facility] and voided. Stood at [employer] to pull up her underwear then had to sit on bed and then returned supine. Pt c/o tightness or band around her chest.  o2 95-97%. MD came in to room and spoke with pt, then she tranasfered to recliner.  Ambulation/Gait             General Gait Details: pt declined   Social research officer, government [employer] (Stroke Patients Only)       Balance                                            Cognition Arousal/Alertness: Awake/alert Behavior During Therapy: WFL for tasks assessed/performed Overall Cognitive Status: Within Functional Limits for tasks assessed                                 General Comments: HOH      Exercises      General Comments General comments (skin integrity, edema, etc.): Pt c/o tightness or band around chest. Daughter thinks she has panic attacks at times due to not feeling like she can breathe.        Pertinent Vitals/Pain Pain Assessment: Faces Faces Pain Scale: Hurts even more Pain Location: mid chest Pain Descriptors / Indicators: Tightness    Home Living                      Prior Function  PT Goals (current goals can now be found in the care plan section) Acute Rehab PT Goals Potential to Achieve Goals: Good Progress towards PT goals: Progressing toward goals    Frequency    Min 3X/week      PT Plan Current plan remains appropriate    Co-evaluation              AM-PAC PT "6 Clicks" Mobility   Outcome Measure  Help needed turning from your back to your side while in a flat bed without using bedrails?: A Little Help needed moving from lying on your back to sitting on the side of a flat bed without using bedrails?: A Little Help needed moving to and from a bed to a chair (including a wheelchair)?: A Little Help needed standing up from a chair using your arms (e.g., wheelchair or bedside chair)?: A Little Help needed to walk in hospital room?: A Little Help needed climbing 3-5 steps with a railing? : A Lot 6 Click Score: 17    End of Session Equipment  Utilized During Treatment: Oxygen Activity Tolerance: Patient limited by pain Patient left: in chair;with call bell/phone within reach;with chair alarm set;with family/visitor present Nurse Communication: Mobility status PT Visit Diagnosis: Difficulty in walking, not elsewhere classified (R26.2);Muscle weakness (generalized) (M62.81)     Time: 1230-1310(no time charged for when on toilet or MD assessing pt) PT Time Calculation (min) (ACUTE ONLY): 40 min  Charges:  $Therapeutic Activity: 23-37 mins                     [name], [name] Pager [phone] 12/19/2018    [name] 12/19/2018, 1:20 PM

## 2018-12-19 NOTE — Progress Notes (Signed)
NAME:  Sarah [name], MRN:  [id number], DOB:  [birth year], LOS: 3 ADMISSION DATE:  12/16/2018, CONSULTATION DATE:  12/3 REFERRING MD:  Dahal, CHIEF COMPLAINT:  Pneumonia and acute on chronic dyspnea    Brief History   85 year old white female followed in the outpatient setting for btx. Presents on 12/2 w/ pneumonia and resultant respiratory failure   Past Medical History  Bronchiectasis: On alternating Omnicef and a azithromycin on first week of each month, also baseline prednisone 10 mg and chest vest, chronic diastolic heart failure, prior stroke, asthma, hypertension, prior non-ST elevation MI, chronic anxiety.  Significant Hospital Events   12/2 admitted. Started on rocephin and azithro 12/3 rocephin changed to cefepime   Consults:  PCCM consulted and assumed care 12/3  Procedures:    Significant Diagnostic Tests:    Micro Data:   BCx2 12/2>>> Urine 12/2 >> multiple organisms, none predominant  MRSA screen 12/2: neg COVID 12/2: negative  L Pleural fluid 12/4 >>  Resp cx 12/4 >> GPC, GVR >>   Antimicrobials:   azith 12/2>>> Rocephin 12/2>>12/3 12/3 cefepime>>>  Interim history/subjective:   Better oriented today, a bit stronger Still with a band-like tightness lower chest  Objective   Blood pressure (!) 146/80, pulse 93, temperature 98 F (36.7 C), temperature source Oral, resp. rate 18, height 5\' 4"  (1.626 m), weight 63.5 kg, last menstrual period 01/14/1970, SpO2 98 %.        Intake/Output Summary (Last 24 hours) at 12/19/2018 1335 Last data filed at 12/19/2018 14/05/2018 Gross per 24 hour  Intake 649.8 ml  Output 600 ml  Net 49.8 ml  Pulse oximetry as low as 88 on room air, placed back on 1-1/2 L however as not clear if she is desaturating when sleeping Filed Weights   12/16/18 1029  Weight: 63.5 kg    Examination: General Elderly ill appearing woman, no distress HEENT OP clear, poor dentition Pulmonary few insp crackles on R, more on L with insp squeaks  Cardiac: regular, distant Abdomen: non distended, +BS Extremities: Warm and dry, no edema Neuro awake and more clear today, moves all ext, follows commands  Resolved Hospital Problem list     Assessment & Plan:   Acute on chronic hypoxic respiratory failure in setting of CAP with left-sided pleural effusion, superimposed on underlying bronchiectasis.  -of note she has prior pseudomonas in sputum 2018 Plan Wean O2 as able 3% saline nebs BDs as ordered azithro day 4/5, cefepime day 3/6 Chest vest held for last 2 days due to her lower chest tightness / discomfort  L pleural effusion  Looks transudative, mixed WBC population. No evidence empyema  Leukocytosis 2/2 above -improving Plan Follow CBC  Essential Hypertension -->sig hypertension here in hospital.  h/o CAD, NSTEMI, diastolic HF and stroke Plan Plavix added back 12/4 Coreg as ordered Amlodipine as ordered PRN hydralazine   Acute encephalopathic w/ H/o depression and anxiety  Plan Continue Cymbalta, and Lexapro Continue Xanax at half dose Neurontin held due to confusion, possibly start back next 1-2 days Encourage normal day night pattern lights on during the day, blinds up during day   GERD Plan Continue home meds  hypothyroidism  Plan Continue home meds  Glaucoma  Plan Continue home meds  Best practice:  Diet: reg  Pain/Anxiety/Delirium protocol (if indicated): NA VAP protocol (if indicated): NA DVT prophylaxis: LMWH GI prophylaxis: PPI Glucose control: n/a Mobility: NR Code Status: DNR Family Communication: updated  Disposition: telemetry  14/4, MD, PhD 12/19/2018,  1:44 PM  Pulmonary and Critical Care [phone] or if no answer [phone]

## 2018-12-19 NOTE — Progress Notes (Signed)
Holding CPT at this time. Patient sore from thoro performed 12/18/2018.

## 2018-12-20 DIAGNOSIS — J90 Pleural effusion, not elsewhere classified: Secondary | ICD-10-CM | POA: Diagnosis not present

## 2018-12-20 DIAGNOSIS — J189 Pneumonia, unspecified organism: Secondary | ICD-10-CM | POA: Diagnosis not present

## 2018-12-20 LAB — CREATININE, SERUM
Creatinine, Ser: 0.78 mg/dL (ref 0.44–1.00)
GFR calc Af Amer: >60 mL/min (ref >60)
GFR calc non Af Amer: >60 mL/min (ref >60)

## 2018-12-20 NOTE — Progress Notes (Signed)
CPT via vest held again today due to discomfort from thoro; patient encouraged to do flutter. Patient had good effort with flutter and feels her cough has improved. Patient placed back on 1 L [place] post treatment due to O2 sats of 86-87% upon RT arrival. Per RN, patient had just gotten up to use bedside. O2 to be weaned as tolerated by patient.

## 2018-12-20 NOTE — TOC Progression Note (Signed)
Transition of Care (TOC) - Progression Note   Patient is from independent living. No TOC needs at the time of assessment. Patient will return to independent living. Please submit new consult if TOC needs arise.   Patient Details  Name: Sarah [name] MRN: [id number] Date of Birth: [birth year]  Transition of Care [facility]) CM/SW Contact  Servando Snare, [place] Phone Number: 12/20/2018, 8:59 AM  Clinical Narrative:            Expected Discharge Plan and Services                                                 Social Determinants of Health (SDOH) Interventions    Readmission Risk Interventions Readmission Risk Prevention Plan 12/20/2018  Transportation Screening Complete  Some recent data might be hidden

## 2018-12-20 NOTE — Progress Notes (Signed)
NAME:  Sarah [name], MRN:  [id number], DOB:  1921-08-17, LOS: 4 ADMISSION DATE:  12/16/2018, CONSULTATION DATE:  12/3 REFERRING MD:  Dahal, CHIEF COMPLAINT:  Pneumonia and acute on chronic dyspnea    Brief History   85 year old white female followed in the outpatient setting for btx. Presents on 12/2 w/ pneumonia and resultant respiratory failure   Past Medical History  Bronchiectasis: On alternating Omnicef and a azithromycin on first week of each month, also baseline prednisone 10 mg and chest vest, chronic diastolic heart failure, prior stroke, asthma, hypertension, prior non-ST elevation MI, chronic anxiety.  Significant Hospital Events   12/2 admitted. Started on rocephin and azithro 12/3 rocephin changed to cefepime   Consults:  PCCM consulted and assumed care 12/3  Procedures:    Significant Diagnostic Tests:    Micro Data:   BCx2 12/2>>> Urine 12/2 >> multiple organisms, none predominant  MRSA screen 12/2: neg COVID 12/2: negative  L Pleural fluid 12/4 >>  Resp cx 12/4 >> GPC, GVR >>   Antimicrobials:   azith 12/2>>> Rocephin 12/2>>12/3 12/3 cefepime>>>  Interim history/subjective:   Says she feels a bit better.  She still has a "band" squeezing her lower thorax, upper abdomen.  Not really a pain, difficulty describe  Objective   Blood pressure (!) 169/87, pulse 95, temperature 97.6 F (36.4 C), temperature source Oral, resp. rate 17, height 5\' 4"  (1.626 m), weight 63.5 kg, last menstrual period 01/14/1970, SpO2 (S) (!) 87 %.        Intake/Output Summary (Last 24 hours) at 12/20/2018 1154 Last data filed at 12/19/2018 1717 Gross per 24 hour  Intake 1205.27 ml  Output 300 ml  Net 905.27 ml  Pulse oximetry as low as 88 on room air, placed back on 1-1/2 L however as not clear if she is desaturating when sleeping Filed Weights   12/16/18 1029  Weight: 63.5 kg    Examination: General laying in bed, ill-appearing but in no distress HEENT oropharynx  clear, poor dentition Pulmonary few bilateral inspiratory crackles, left greater than right, some left inspiratory squeaks Cardiac: Regular, distant Abdomen: Nondistended, positive bowel sounds Extremities: No edema Neuro awake, alert, interacting appropriately, answers questions and follows commands  Resolved Hospital Problem list     Assessment & Plan:   Acute on chronic hypoxic respiratory failure in setting of CAP with left-sided pleural effusion, superimposed on underlying bronchiectasis.  -of note she has prior pseudomonas in sputum 2018 Plan Continue wean O2 as able Continue 3% saline nebs Bronchodilators as ordered Baseline prednisone 10 mg Azithromycin day 5/5, cefepime day 4/6 Discussed restarting chest vest therapy with her today, she is willing to do so.  If the lower chest tightness/discomfort precludes then we will stop. Flutter valve  L pleural effusion  Looks transudative, mixed WBC population. No evidence empyema Await culture data  Leukocytosis 2/2 above -improving Plan Follow CBC  Essential Hypertension -->sig hypertension here in hospital.  h/o CAD, NSTEMI, diastolic HF and stroke Plan Continue Plavix, carvedilol, amlodipine Hydralazine as needed  Acute encephalopathic w/ H/o depression and anxiety  Plan Cymbalta, Lexapro Xanax reordered at half her home dose Consider restart Neurontin on 12/7 Encourage normal day night pattern, lights on, blinds up, etc.  GERD Plan Continue Protonix twice daily  hypothyroidism  Plan Synthroid as ordered  Glaucoma  Plan Xalatan as ordered  Best practice:  Diet: reg  Pain/Anxiety/Delirium protocol (if indicated): NA VAP protocol (if indicated): NA DVT prophylaxis: LMWH GI prophylaxis: PPI Glucose  control: n/a Mobility: NR Code Status: DNR Family Communication: Updated Meridith at bedside 12/6 Disposition: telemetry  Levy Pupa, MD, PhD 12/20/2018, 11:54 AM [place] [facility]  [phone] or if no answer [phone]

## 2018-12-21 ENCOUNTER — Inpatient Hospital Stay (HOSPITAL_COMMUNITY): Payer: Medicare Other

## 2018-12-21 DIAGNOSIS — J90 Pleural effusion, not elsewhere classified: Secondary | ICD-10-CM | POA: Diagnosis not present

## 2018-12-21 LAB — BASIC METABOLIC PANEL
Anion gap: 11 (ref 5–15)
BUN: 13 mg/dL (ref 8–23)
CO2: 33 mmol/L — ABNORMAL HIGH (ref 22–32)
Calcium: 8.9 mg/dL (ref 8.9–10.3)
Chloride: 95 mmol/L — ABNORMAL LOW (ref 98–111)
Creatinine, Ser: 0.81 mg/dL (ref 0.44–1.00)
GFR calc Af Amer: >60 mL/min (ref >60)
GFR calc non Af Amer: >60 mL/min (ref >60)
Glucose, Bld: 85 mg/dL (ref 70–99)
Potassium: 4.1 mmol/L (ref 3.5–5.1)
Sodium: 139 mmol/L (ref 135–145)

## 2018-12-21 LAB — CBC
HCT: 42.1 % (ref 36.0–46.0)
Hemoglobin: 11.4 g/dL — ABNORMAL LOW (ref 12.0–15.0)
MCH: 25.3 pg — ABNORMAL LOW (ref 26.0–34.0)
MCHC: 27.1 g/dL — ABNORMAL LOW (ref 30.0–36.0)
MCV: 93.6 fL (ref 80.0–100.0)
Platelets: 363 10*3/uL (ref 150–400)
RBC: 4.5 MIL/uL (ref 3.87–5.11)
RDW: 18.8 % — ABNORMAL HIGH (ref 11.5–15.5)
WBC: 12.8 10*3/uL — ABNORMAL HIGH (ref 4.0–10.5)
nRBC: 0 % (ref 0.0–0.2)

## 2018-12-21 LAB — PH, BODY FLUID: pH, Body Fluid: 7.7

## 2018-12-21 LAB — CULTURE, BLOOD (ROUTINE X 2)
Culture: NO GROWTH
Culture: NO GROWTH

## 2018-12-21 MED ORDER — POTASSIUM CHLORIDE CRYS ER 20 MEQ PO TBCR
20.0000 meq | EXTENDED_RELEASE_TABLET | Freq: Once | ORAL | Status: AC
  Administered 2018-12-21: 20 meq via ORAL
  Filled 2018-12-21: qty 1

## 2018-12-21 MED ORDER — SENNA 8.6 MG PO TABS
1.0000 | ORAL_TABLET | Freq: Every evening | ORAL | Status: DC | PRN
  Administered 2018-12-22: 8.6 mg via ORAL
  Filled 2018-12-21 (×2): qty 1

## 2018-12-21 MED ORDER — FUROSEMIDE 10 MG/ML IJ SOLN
40.0000 mg | Freq: Once | INTRAMUSCULAR | Status: AC
  Administered 2018-12-21: 40 mg via INTRAVENOUS
  Filled 2018-12-21: qty 4

## 2018-12-21 MED ORDER — METHYLPREDNISOLONE SODIUM SUCC 40 MG IJ SOLR
40.0000 mg | Freq: Once | INTRAMUSCULAR | Status: AC
  Administered 2018-12-21: 40 mg via INTRAVENOUS
  Filled 2018-12-21: qty 1

## 2018-12-21 NOTE — Progress Notes (Signed)
PHARMACY NOTE -  Cefepime  Pharmacy has been assisting with dosing of Cefepime for PNA.  Dosage remains stable at 2g IV q12 hr and need for further dosage adjustment appears unlikely at present given SCr at baseline, D5/6 of therapy  Pharmacy will sign off, following peripherally for culture results or dose adjustments. Please reconsult if a change in clinical status warrants re-evaluation of dosage.  Reuel Boom, PharmD, BCPS [phone] 12/21/2018, 2:08 PM

## 2018-12-21 NOTE — Progress Notes (Signed)
  Progress Note   Date: 12/18/2018  Patient Name: Sarah [name]        MRN#: [id number]   Clarification of diagnosis:  Acute metabolic encephalopathy

## 2018-12-21 NOTE — Progress Notes (Signed)
NAME:  Sarah [name], MRN:  [id number], DOB:  1921-03-22, LOS: 5 ADMISSION DATE:  12/16/2018, CONSULTATION DATE:  12/3 REFERRING MD:  Dahal, CHIEF COMPLAINT:  Pneumonia and acute on chronic dyspnea    Brief History   85 year old white female followed in the outpatient setting for btx. Presents on 12/2 w/ pneumonia and resultant respiratory failure  Past Medical History  Bronchiectasis: On alternating Omnicef and a azithromycin on first week of each month, also baseline prednisone 10 mg and chest vest, chronic diastolic heart failure, prior stroke, asthma, hypertension, prior non-ST elevation MI, chronic anxiety.  Significant Hospital Events   12/2 admitted. Started on rocephin and azithro 12/3 rocephin changed to cefepime   Consults:  PCCM consulted and assumed care 12/3  Procedures:  L thora 12/4 > cloudy fluid removed  Significant Diagnostic Tests:    Micro Data:  BCx2 12/2>>> Urine 12/2 >> multiple organisms, none predominant  MRSA screen 12/2: neg COVID 12/2: negative  L Pleural fluid 12/4 >>  Resp cx 12/4 >> GPC, GVR >>   Antimicrobials:  azith 12/2>>> Rocephin 12/2>>12/3 12/3 cefepime>>>  Interim history/subjective:  Feels improved.  Asking when can return to independent living. +4.7L net  Objective   Blood pressure 136/72, pulse 98, temperature 97.7 F (36.5 C), temperature source Oral, resp. rate 20, height 5\' 4"  (1.626 m), weight 63.5 kg, last menstrual period 01/14/1970, SpO2 94 %.        Intake/Output Summary (Last 24 hours) at 12/21/2018 14/07/2018 Last data filed at 12/21/2018 0800 Gross per 24 hour  Intake 2486.54 ml  Output 1000 ml  Net 1486.54 ml  Pulse oximetry as low as 88 on room air, placed back on 1-1/2 L however as not clear if she is desaturating when sleeping Filed Weights   12/16/18 1029  Weight: 63.5 kg   Examination: General: Elderly female, frail, sitting in recliner eating breakfast, in NAD. Neuro: A&O x 3, no deficits. HEENT:  [place]/AT. Sclerae anicteric. EOMI. Cardiovascular: RRR, no M/R/G.  Lungs: Respirations even and unlabored.  Faint crackles. Abdomen: BS x 4, soft, NT/ND.  Musculoskeletal: No gross deformities, no edema.  Skin: Intact, warm, no rashes.  Assessment & Plan:   Acute on chronic hypoxic respiratory failure in setting of CAP with small b/l effusions, superimposed on underlying bronchiectasis.  -of note she has prior pseudomonas in sputum 2018 Plan Continue wean O2 as able Bronchodilators as ordered Baseline prednisone 10 mg Cefepime day 5/6 Lasix x 1 dose and assess response.  Would like to diuresis some as is +4.7L net Flutter valve Resume home prophylactic abx regimen in January  L pleural effusion (s/p thora 12/4 with 14/4 fluid removed, transudative).  Looks transudative, mixed WBC population. No evidence empyema. Await culture data Lasix as above  Essential Hypertension -->sig hypertension here in hospital.  h/o CAD, NSTEMI, diastolic HF and stroke Plan Continue Plavix, carvedilol, amlodipine Hydralazine as needed Lasix as above  Acute encephalopathy w/ H/o depression and anxiety.  Encephalopathy now resolved Plan Cymbalta, Lexapro Xanax reordered at half her home dose Consider restart Neurontin on 12/7 Encourage normal day night pattern, lights on, blinds up, etc.  GERD Plan Continue Protonix twice daily  hypothyroidism  Plan Synthroid as ordered  Glaucoma  Plan Xalatan as ordered  Best practice:  Diet: reg  Pain/Anxiety/Delirium protocol (if indicated): NA VAP protocol (if indicated): NA DVT prophylaxis: LMWH GI prophylaxis: PPI Glucose control: n/a Mobility: NR Code Status: DNR Family Communication: Updated Meridith at bedside 12/6 Disposition:  telemetry.  Pending how she responds to lasix, she should be close to discharge back to independent living.  Will also discuss with daughter.   Montey Hora, [facility]  12/21/2018, 9:43 AM

## 2018-12-21 NOTE — Progress Notes (Addendum)
Physical Therapy Treatment Patient Details Name: Sarah [name] MRN: [id number] DOB: November 18, 1921 Today's Date: 12/21/2018     SATURATION QUALIFICATIONS: (This note is used to comply with regulatory documentation for home oxygen)  Patient Saturations on Room Air at Rest = 87%  Patient Saturations on 1 Liter(s) of oxygen while Ambulating = 88-89%    History of Present Illness 85 y.o. female with medical history significant of bronchiectasis, chronic diastolic CHF, stroke, asthma, hypertension, NSTEMI, anxiety attacks and admitted for acute on chronic hypoxic respiratory failure in setting of CAP superimposed on underlying bronchiectasis    PT Comments    Progressing with mobility.    Follow Up Recommendations  Home health PT;Supervision - Intermittent     Equipment Recommendations  None recommended by PT    Recommendations for Other Services       Precautions / Restrictions Precautions Precautions: Fall Precaution Comments: monitor O2 Restrictions Weight Bearing Restrictions: No    Mobility  Bed Mobility               General bed mobility comments: oob in recliner  Transfers Overall transfer level: Needs assistance Equipment used: Rolling walker (2 wheeled) Transfers: Sit to/from Stand Sit to Stand: Min guard         General transfer comment: increased time. close guard for safety. vcs safety, hand placement  Ambulation/Gait Ambulation/Gait assistance: Min guard Gait Distance (Feet): 15 Feet(x2) Assistive device: Rolling walker (2 wheeled) Gait Pattern/deviations: Step-through pattern;Decreased stride length;Trunk flexed     General Gait Details: close guard for safety mostly. intermittent assist to maneuver with RW, especially in tight space. pt tolerated short distance well.   Stairs             Wheelchair Mobility    Modified Rankin (Stroke Patients Only)       Balance Overall balance assessment: Needs assistance          Standing balance support: Bilateral upper extremity supported Standing balance-Leahy Scale: Fair                              Cognition Arousal/Alertness: Awake/alert Behavior During Therapy: WFL for tasks assessed/performed Overall Cognitive Status: No family/caregiver present to determine baseline cognitive functioning                                 General Comments: HOH. Pt reports some mild confusion      Exercises      General Comments        Pertinent Vitals/Pain Pain Assessment: Faces Faces Pain Scale: Hurts little more Pain Location: band across chest Pain Descriptors / Indicators: Tightness Pain Intervention(s): Monitored during session    Home Living                      Prior Function            PT Goals (current goals can now be found in the care plan section) Progress towards PT goals: Progressing toward goals    Frequency    Min 3X/week      PT Plan Current plan remains appropriate    Co-evaluation              AM-PAC PT "6 Clicks" Mobility   Outcome Measure  Help needed turning from your back to your side while in a flat bed without using bedrails?: A Little Help  needed moving from lying on your back to sitting on the side of a flat bed without using bedrails?: A Little Help needed moving to and from a bed to a chair (including a wheelchair)?: A Little Help needed standing up from a chair using your arms (e.g., wheelchair or bedside chair)?: A Little Help needed to walk in hospital room?: A Little Help needed climbing 3-5 steps with a railing? : A Lot 6 Click Score: 17    End of Session Equipment Utilized During Treatment: Oxygen Activity Tolerance: Patient tolerated treatment well Patient left: in chair;with call bell/phone within reach;with chair alarm set   PT Visit Diagnosis: Difficulty in walking, not elsewhere classified (R26.2);Muscle weakness (generalized) (M62.81)     Time:  2119-4174 PT Time Calculation (min) (ACUTE ONLY): 25 min  Charges:  $Gait Training: 23-37 mins                        Weston Anna, PT Acute Rehabilitation Services Pager: [phone] Office: [phone]

## 2018-12-21 NOTE — Care Management Important Message (Signed)
Important Message  Patient Details IM Letter given to Cookie McGibboney RN to present to the Patient Name: Sarah [name] MRN: [id number] Date of Birth: 01-31-1921   Medicare Important Message Given:  Yes     Kerin Salen 12/21/2018, 12:11 PM

## 2018-12-22 ENCOUNTER — Inpatient Hospital Stay (HOSPITAL_COMMUNITY): Payer: Medicare Other

## 2018-12-22 DIAGNOSIS — J471 Bronchiectasis with (acute) exacerbation: Secondary | ICD-10-CM | POA: Diagnosis not present

## 2018-12-22 DIAGNOSIS — J90 Pleural effusion, not elsewhere classified: Secondary | ICD-10-CM | POA: Diagnosis not present

## 2018-12-22 LAB — CBC
HCT: 37.7 % (ref 36.0–46.0)
Hemoglobin: 10.8 g/dL — ABNORMAL LOW (ref 12.0–15.0)
MCH: 26 pg (ref 26.0–34.0)
MCHC: 28.6 g/dL — ABNORMAL LOW (ref 30.0–36.0)
MCV: 90.8 fL (ref 80.0–100.0)
Platelets: 285 10*3/uL (ref 150–400)
RBC: 4.15 MIL/uL (ref 3.87–5.11)
RDW: 18.6 % — ABNORMAL HIGH (ref 11.5–15.5)
WBC: 9.7 10*3/uL (ref 4.0–10.5)
nRBC: 0 % (ref 0.0–0.2)

## 2018-12-22 LAB — CULTURE, RESPIRATORY W GRAM STAIN

## 2018-12-22 LAB — PHOSPHORUS: Phosphorus: 3.3 mg/dL (ref 2.5–4.6)

## 2018-12-22 LAB — BASIC METABOLIC PANEL
Anion gap: 10 (ref 5–15)
BUN: 16 mg/dL (ref 8–23)
CO2: 34 mmol/L — ABNORMAL HIGH (ref 22–32)
Calcium: 8.6 mg/dL — ABNORMAL LOW (ref 8.9–10.3)
Chloride: 91 mmol/L — ABNORMAL LOW (ref 98–111)
Creatinine, Ser: 0.77 mg/dL (ref 0.44–1.00)
GFR calc Af Amer: >60 mL/min (ref >60)
GFR calc non Af Amer: >60 mL/min (ref >60)
Glucose, Bld: 132 mg/dL — ABNORMAL HIGH (ref 70–99)
Potassium: 4.2 mmol/L (ref 3.5–5.1)
Sodium: 135 mmol/L (ref 135–145)

## 2018-12-22 LAB — RHEUMATOID FACTORS, FLUID: Rheumatoid Arthritis, Qn/Fluid: NEGATIVE

## 2018-12-22 LAB — CYTOLOGY - NON PAP

## 2018-12-22 LAB — BODY FLUID CULTURE: Culture: NO GROWTH

## 2018-12-22 LAB — MAGNESIUM: Magnesium: 1.9 mg/dL (ref 1.7–2.4)

## 2018-12-22 MED ORDER — FUROSEMIDE 10 MG/ML IJ SOLN
40.0000 mg | Freq: Once | INTRAMUSCULAR | Status: AC
  Administered 2018-12-22: 40 mg via INTRAVENOUS
  Filled 2018-12-22: qty 4

## 2018-12-22 MED ORDER — POTASSIUM CHLORIDE CRYS ER 20 MEQ PO TBCR
20.0000 meq | EXTENDED_RELEASE_TABLET | Freq: Once | ORAL | Status: AC
  Administered 2018-12-22: 20 meq via ORAL
  Filled 2018-12-22: qty 1

## 2018-12-22 NOTE — Progress Notes (Signed)
NAME:  Sarah [name], MRN:  [id number], DOB:  09/01/1921, LOS: 6 ADMISSION DATE:  12/16/2018, CONSULTATION DATE:  12/3 REFERRING MD:  Dahal, CHIEF COMPLAINT:  Pneumonia and acute on chronic dyspnea    Brief History   85 year old white female followed in the outpatient setting for btx. Presents on 12/2 w/ pneumonia and resultant respiratory failure  Past Medical History  Bronchiectasis: On alternating Omnicef and a azithromycin on first week of each month, also baseline prednisone 10 mg and chest vest, chronic diastolic heart failure, prior stroke, asthma, hypertension, prior non-ST elevation MI, chronic anxiety.  Significant Hospital Events   12/2 admitted. Started on rocephin and azithro 12/3 rocephin changed to cefepime   Consults:  PCCM consulted and assumed care 12/3  Procedures:  L thora 12/4 > cloudy fluid removed  Significant Diagnostic Tests:    Micro Data:  BCx2 12/2>>> Urine 12/2 >> multiple organisms, none predominant  MRSA screen 12/2: neg COVID 12/2: negative  L Pleural fluid 12/4 >>  Resp cx 12/4 >> GPC, GVR >> few pseudomonas >   Antimicrobials:  azith 12/2> 12/7 Rocephin 12/2>>12/3 12/3 cefepime>>>  Interim history/subjective:  Great response to lasix with almost 4L UOP. On room air this AM.  Sputum culture growing pseudomonas, susceptibilities pending.  Cytology from thora pending.  Objective   Blood pressure (!) 146/77, pulse (!) 101, temperature 97.7 F (36.5 C), temperature source Oral, resp. rate 20, height 5\' 4"  (1.626 m), weight 63.5 kg, last menstrual period 01/14/1970, SpO2 94 %.        Intake/Output Summary (Last 24 hours) at 12/22/2018 0804 Last data filed at 12/22/2018 0516 Gross per 24 hour  Intake 1423.99 ml  Output 3450 ml  Net -2026.01 ml  Pulse oximetry as low as 88 on room air, placed back on 1-1/2 L however as not clear if she is desaturating when sleeping Filed Weights   12/16/18 1029  Weight: 63.5 kg   Examination:  General: Elderly female, frail, resting in bed watching TV, in NAD. Neuro: A&O x 3, no deficits. HEENT: [place]/AT. Sclerae anicteric. EOMI. Cardiovascular: RRR, no M/R/G.  Lungs: Respirations even and unlabored.  Faint crackles. Abdomen: BS x 4, soft, NT/ND.  Musculoskeletal: No gross deformities, no edema.  Skin: Intact, warm, no rashes.  Assessment & Plan:   Acute on chronic hypoxic respiratory failure in setting of CAP with small b/l effusions, superimposed on underlying bronchiectasis.  -of note she has prior pseudomonas in sputum 2018 Plan Continue wean O2 as able, currently is on room air Bronchodilators as ordered Baseline prednisone 10 mg Cefepime day 6 Await sputum susceptibilities then can start appropriate PO drug for total 10 days abx therapy Given great response to lasix, will dose 1 additional dose this AM (currently +2.7L net) Flutter valve Resume home prophylactic abx regimen in January  L pleural effusion (s/p thora 12/4 with 14/4 fluid removed, transudative).  Looks transudative, mixed WBC population. No evidence empyema. Culture and cytology data still pending Lasix as above  Essential Hypertension -->sig hypertension here in hospital.  h/o CAD, NSTEMI, diastolic HF and stroke Plan Continue Plavix, carvedilol, amlodipine Hydralazine as needed Lasix as above  Acute encephalopathy w/ H/o depression and anxiety.  Encephalopathy now resolved Plan Cymbalta, Lexapro Xanax reordered at half her home dose Consider restart Neurontin on 12/7 Encourage normal day night pattern, lights on, blinds up, etc  GERD Plan Continue Protonix twice daily  Hypothyroidism Plan Synthroid as ordered  Glaucoma  Plan Xalatan as ordered  Best practice:   Diet: Reg  Pain/Anxiety/Delirium protocol (if indicated): NA VAP protocol (if indicated): NA DVT prophylaxis: LMWH GI prophylaxis: PPI Glucose control: n/a Mobility: NR Code Status: DNR Family Communication: Updated  Meridith over the phone.  Will plan for discharge tomorrow 12/9 back to [facility] independent living. Disposition: telemetry.  Pending discharge as above.   Montey Hora, [facility] 12/22/2018, 8:04 AM

## 2018-12-22 NOTE — Plan of Care (Signed)
  Problem: Activity: Goal: Risk for activity intolerance will decrease Outcome: Progressing   Problem: Pain Managment: Goal: General experience of comfort will improve Outcome: Progressing   Problem: Safety: Goal: Ability to remain free from injury will improve Outcome: Progressing   

## 2018-12-23 DIAGNOSIS — J471 Bronchiectasis with (acute) exacerbation: Secondary | ICD-10-CM | POA: Diagnosis not present

## 2018-12-23 DIAGNOSIS — J189 Pneumonia, unspecified organism: Secondary | ICD-10-CM | POA: Diagnosis not present

## 2018-12-23 LAB — CBC
HCT: 36.9 % (ref 36.0–46.0)
Hemoglobin: 10.4 g/dL — ABNORMAL LOW (ref 12.0–15.0)
MCH: 25.7 pg — ABNORMAL LOW (ref 26.0–34.0)
MCHC: 28.2 g/dL — ABNORMAL LOW (ref 30.0–36.0)
MCV: 91.1 fL (ref 80.0–100.0)
Platelets: 352 10*3/uL (ref 150–400)
RBC: 4.05 MIL/uL (ref 3.87–5.11)
RDW: 18.6 % — ABNORMAL HIGH (ref 11.5–15.5)
WBC: 13.9 10*3/uL — ABNORMAL HIGH (ref 4.0–10.5)
nRBC: 0 % (ref 0.0–0.2)

## 2018-12-23 LAB — BASIC METABOLIC PANEL
Anion gap: 10 (ref 5–15)
BUN: 24 mg/dL — ABNORMAL HIGH (ref 8–23)
CO2: 34 mmol/L — ABNORMAL HIGH (ref 22–32)
Calcium: 8.8 mg/dL — ABNORMAL LOW (ref 8.9–10.3)
Chloride: 91 mmol/L — ABNORMAL LOW (ref 98–111)
Creatinine, Ser: 0.95 mg/dL (ref 0.44–1.00)
GFR calc Af Amer: 59 mL/min — ABNORMAL LOW (ref >60)
GFR calc non Af Amer: 51 mL/min — ABNORMAL LOW (ref >60)
Glucose, Bld: 91 mg/dL (ref 70–99)
Potassium: 3.8 mmol/L (ref 3.5–5.1)
Sodium: 135 mmol/L (ref 135–145)

## 2018-12-23 LAB — MAGNESIUM: Magnesium: 1.9 mg/dL (ref 1.7–2.4)

## 2018-12-23 LAB — PHOSPHORUS: Phosphorus: 3.2 mg/dL (ref 2.5–4.6)

## 2018-12-23 LAB — CHOLESTEROL, BODY FLUID: Cholesterol, Fluid: 62 mg/dL

## 2018-12-23 MED ORDER — CIPROFLOXACIN HCL 500 MG PO TABS
500.0000 mg | ORAL_TABLET | Freq: Two times a day (BID) | ORAL | Status: DC
  Administered 2018-12-23: 500 mg via ORAL
  Filled 2018-12-23: qty 1

## 2018-12-23 MED ORDER — CIPROFLOXACIN HCL 500 MG PO TABS: 500.0000 mg | ORAL_TABLET | Freq: Two times a day (BID) | ORAL | 0 refills | Status: AC

## 2018-12-23 MED ORDER — AMLODIPINE BESYLATE 10 MG PO TABS: 10.0000 mg | ORAL_TABLET | Freq: Every day | ORAL | 6 refills | Status: DC

## 2018-12-23 MED ORDER — GABAPENTIN 100 MG PO CAPS: ORAL_CAPSULE | ORAL | Status: DC

## 2018-12-23 NOTE — TOC Progression Note (Signed)
Transition of Care [facility]) - Progression Note    Patient Details  Name: Sarah [name] MRN: [id number] Date of Birth: [birth year]  [facility]) CM/SW Contact  Purcell Mouton, RN Phone Number: 12/23/2018, 1:22 PM  Clinical Narrative:    [name] pt's daughter will use [facility] for HHPT and [facility] for [facility]. Orders faxed to [facility] [phone].        Expected Discharge Plan and Services           Expected Discharge Date: 12/23/18                                     Social Determinants of Health (SDOH) Interventions    Readmission Risk Interventions Readmission Risk Prevention Plan 12/20/2018  Transportation Screening Complete  Some recent data might be hidden

## 2018-12-23 NOTE — TOC Progression Note (Signed)
Transition of Care [facility]) - Progression Note    Patient Details  Name: Sarah [name] MRN: [id number] Date of Birth: 05-16-1921  Transition of Care [facility]) CM/SW Contact  Purcell Mouton, RN Phone Number: 12/23/2018, 11:11 AM  Clinical Narrative:     Pt will discharge home with [facility] and NA. Daughter is aware. Start for care Saturday.       Expected Discharge Plan and Services           Expected Discharge Date: 12/23/18                                     Social Determinants of Health (SDOH) Interventions    Readmission Risk Interventions Readmission Risk Prevention Plan 12/20/2018  Transportation Screening Complete  Some recent data might be hidden

## 2018-12-23 NOTE — Discharge Instructions (Signed)
Resume the cycling antibiotics in January

## 2018-12-23 NOTE — Progress Notes (Signed)
Patient and her daughter, [name], given discharge, medication, and follow up instructions, verbalized understanding, IV removed, personal belongings with patient, family to transport home

## 2018-12-23 NOTE — Discharge Summary (Addendum)
Physician Discharge Summary         Patient ID: Sarah [name] MRN: [id number] DOB/AGE: 05-19-1921 85 y.o.  Admit date: 12/16/2018 Discharge date: 12/23/2018  Discharge Diagnoses:    Acute on chronic hypoxic respiratory failure Pseudomonas Pneumonia  Bronchiectasis flare  Left pleural effusion (transudate) Essential Hypertension Acute metabolic Encephalopathy  CAD NSTEMI Diastolic Heart Failure H/o stroke (remote) Anxiety  Depression  GERD Hypothyroidism  Glaucoma   Discharge summary    85 year old female w/ h/o BTX followed by Dr Lamonte Sakai. Was in usual state of health until am of 12/2 when she began to complain of new productive cough, worsening shortness of breath and altered mental status. Pulse oximetry noted to be in 70s. Daughter noted what appeared to be episode of near syncope w/ associated bradycardia.   EMS called. Oxygen applied. Transported to ER where initial eval was c/w new left sided PNA and associated left effusion. Cultures were sent. She was initially placed on empiric antibiotics (azithro and cefepime)  and pulmonary was consulted the following day. As she was well known by Dr Lamonte Sakai we assumed her primary care.   On 12/4 we performed a bedside thoracentesis for the left pleural effusion. This yielded about 450 ml of clear pleural fluid. The analysis was c/w transudate.   Significant in-hospital events were as follows: -SBP in 180-190s. We added Norvasc to her antihypertensive regimen.  -Mild acute metabolic encephalopathy-->this we felt was primarily due to infection +/- hypoxia and resolved over the course of her stay. We did decrease her xanax dosing and held her neurontin. This was discussed with her daughter.  -Anxiety attack: this was only witnessed once on 12/4 and responded well to additional xanax.  -Sputum culture was positive for Pseudomonas; we awaited sensitivities and these showed excellent sensitivity to Cipro.   As of 12/9 she is ready for dc  w/ plan as outlined below:    Discharge Plan by Active Problems    Acute on chronic hypoxic respiratory failure in setting of Pseudomonas Pneumonia vs Bronchiectasis flare Plan Home on 3 more days of Cipro  Resume home bronchodilators  Flutter valve F/u 7 days (post-hospital)  HTN  Plan  Home on norvasc and coreg  H/o CAD, NSTEMI and diastolic HF Plan Cont plavix  GERD Plan Cont PPI  Hypothyroidism  Plan Cont synthroid   Glaucoma Plan Cont home eye gtts.   Significant Hospital tests/ studies   Procedures    L thora 12/4 > 468ml cloudy fluid removed  Culture data/antimicrobials     BCx2 12/2>>>neagtive  Urine 12/2 >> multiple organisms, none predominant  MRSA screen 12/2: neg COVID 12/2: negative  L Pleural fluid 12/4 >>  Resp cx 12/4 >> GPC, GVR >> few pseudomonas  (pansensitive)  azith 12/2> 12/7 Rocephin 12/2>>12/3 12/3 cefepime>>12/9 12/9 cipro>>>  Consults   None     Discharge Exam: Blood Pressure 132/75 (BP Location: Left Arm)   Pulse 85   Temperature 98.7 F (37.1 C) (Oral)   Respiration 16   Height 5\' 4"  (1.626 m)   Weight 63.5 kg   Last Menstrual Period 01/14/1970   Oxygen Saturation 100%   Body Mass Index 24.03 kg/m  Room air   General frail white female resting in chair she is in no distress.  HENT NCAT no JVD Pulm scattered rales no accessory use  Card RRR no MRG abd not tender + bowel sound  Ext no edema  Neuro intact   Labs at discharge   Lab  Results  Component Value Date   CREATININE 0.95 12/23/2018   BUN 24 (H) 12/23/2018   NA 135 12/23/2018   K 3.8 12/23/2018   CL 91 (L) 12/23/2018   CO2 34 (H) 12/23/2018   Lab Results  Component Value Date   WBC 13.9 (H) 12/23/2018   HGB 10.4 (L) 12/23/2018   HCT 36.9 12/23/2018   MCV 91.1 12/23/2018   PLT 352 12/23/2018   Lab Results  Component Value Date   ALT 14 12/17/2018   AST 21 12/17/2018   ALKPHOS 52 12/17/2018   BILITOT 0.4 12/17/2018   Lab Results   Component Value Date   INR 1.10 07/08/2014   INR 1.00 06/19/2011   INR 1.00 09/05/2010    Current radiological studies    Dg Chest Port 1 View Am  Result Date: 12/22/2018 CLINICAL DATA:  Respiratory failure. EXAM: PORTABLE CHEST 1 VIEW COMPARISON:  Radiograph yesterday. FINDINGS: Slight improved aeration with decreased opacity at the left lung base. Persistent retrocardiac opacity in part related to hiatal hernia. Unchanged heart size and mediastinal contours. No new airspace disease. Right upper lobe scarring again seen. Possible small right pleural effusion is unchanged. No pneumothorax. IMPRESSION: 1. Improving aeration at the left lung base. 2. Persistent retrocardiac opacity in part related to hiatal hernia. 3. Possible small right pleural effusion unchanged. Electronically Signed   By: Narda Rutherford M.D.   On: 12/22/2018 05:32    Disposition:    Discharge disposition: 01-Home or Self Care     ho  Discharge Instructions    Diet - low sodium heart healthy   Complete by: As directed    Increase activity slowly   Complete by: As directed       Allergies as of 12/23/2018    Allergen Reactions Comment   Codeine Anaphylaxis    Hydrocodone Nausea And Vomiting, Other (See Comments) SYNCOPE AND BRADYCARDIA   Isosorbide Nitrate Other (See Comments) BRADYCARDIA   Oxycodone Palpitations Rapid heart beat   Clarithromycin Nausea And Vomiting Has patient had a PCN reaction causing immediate rash, facial/tongue/throat swelling, SOB or lightheadedness with hypotension: Unknown Has patient had a PCN reaction causing severe rash involving mucus membranes or skin necrosis: Unknown Has patient had a PCN reaction that required hospitalization: Unknown Has patient had a PCN reaction occurring within the last 10 years: Unknown If all of the above answers are "NO", then may proceed with Cephalosporin use.   Fiorinal [butalbital-aspirin-caffeine] Swelling, Other (See Comments) Facial swelling    Levofloxacin Nausea And Vomiting, Other (See Comments) SYNCOPE ALSO   Augmentin [amoxicillin-pot Clavulanate] Diarrhea    Doxycycline  Sweats and aches      Medication List    Take these medications   albuterol (2.5 MG/3ML) 0.083% nebulizer solution Commonly known as: PROVENTIL USE 1 VIAL VIA NEBULIZER TWICE DAILY What changed: See the new instructions.   Alphagan P 0.1 % Soln Generic drug: brimonidine Place 1 drop into the left eye every 8 (eight) hours.   ALPRAZolam 0.5 MG tablet Commonly known as: XANAX Take 1 tablet by mouth 2 (two) times daily.   amLODipine 10 MG tablet Commonly known as: NORVASC Take 1 tablet (10 mg total) by mouth daily. Start taking on: December 24, 2018   azithromycin 250 MG tablet Commonly known as: ZITHROMAX Take 1 tablet (250 mg total) by mouth daily. Every other month   calcium carbonate 500 MG chewable tablet Commonly known as: TUMS - dosed in mg elemental calcium Chew 1 tablet by mouth  every 8 (eight) hours as needed for indigestion or heartburn.   carvedilol 6.25 MG tablet Commonly known as: COREG TAKE 1 TABLET(6.25 MG) BY MOUTH TWICE DAILY What changed: See the new instructions.   cefdinir 300 MG capsule Commonly known as: OMNICEF Take 300 mg by mouth 2 (two) times daily. Every other month   ciprofloxacin 500 MG tablet Commonly known as: CIPRO Take 1 tablet (500 mg total) by mouth 2 (two) times daily for 3 days.   clopidogrel 75 MG tablet Commonly known as: PLAVIX TAKE 1 TABLET(75 MG) BY MOUTH DAILY What changed: See the new instructions.   DULoxetine 20 MG capsule Commonly known as: CYMBALTA Take 20 mg by mouth 3 (three) times daily.   escitalopram 5 MG tablet Commonly known as: LEXAPRO Take 5 mg by mouth daily.   FUSION PLUS PO Take 1 tablet by mouth daily.   gabapentin 100 MG capsule Commonly known as: NEURONTIN Hold for now. IF need to resume please resume at 100mg  at night only and advance as needed to 300mg  at  night. What changed:   how much to take  how to take this  when to take this  additional instructions   latanoprost 0.005 % ophthalmic solution Commonly known as: XALATAN Place 1 drop into both eyes at bedtime.   levothyroxine 50 MCG tablet Commonly known as: SYNTHROID Take 50 mcg by mouth daily.   pantoprazole 40 MG tablet Commonly known as: PROTONIX Take 40 mg by mouth 2 (two) times daily.   predniSONE 10 MG tablet Commonly known as: DELTASONE TAKE 1 TABLET(10 MG) BY MOUTH DAILY WITH BREAKFAST What changed: See the new instructions.   SENOKOT PO Take 1 tablet by mouth daily as needed (constipation).        Follow-up appointment    Discharge Condition:    good  Physician Statement:   The Patient was personally examined, the discharge assessment and plan has been personally reviewed and I agree with ACNP Marbin Olshefski's assessment and plan. 35 minutes of time have been dedicated to discharge assessment, planning and discharge instructions.   Signed: Shelby Mattocksete E Siera Beyersdorf 12/23/2018, 10:20 AM

## 2018-12-23 NOTE — TOC Progression Note (Signed)
Transition of Care [facility]) - Progression Note    Patient Details  Name: Sarah [name] MRN: [id number] Date of Birth: 08-08-1921  [facility]) CM/SW Contact  Purcell Mouton, RN Phone Number: 12/23/2018, 12:05 PM  Clinical Narrative:    Pt's daughter [name] who states that she will use [facility] and not HH.         Expected Discharge Plan and Services           Expected Discharge Date: 12/23/18                                     Social Determinants of Health (SDOH) Interventions    Readmission Risk Interventions Readmission Risk Prevention Plan 12/20/2018  Transportation Screening Complete  Some recent data might be hidden

## 2018-12-28 ENCOUNTER — Telehealth: Payer: Self-pay

## 2018-12-28 ENCOUNTER — Telehealth: Payer: Self-pay | Admitting: Primary Care

## 2018-12-28 NOTE — Telephone Encounter (Signed)
Yes that is fine to convert to televisit d/t upcoming weather on Wednesday

## 2018-12-28 NOTE — Telephone Encounter (Signed)
Sarah [name] would it be ok to do a televisit on Wednesday in case there is bad weather?

## 2018-12-28 NOTE — Telephone Encounter (Signed)
Telephone call to patients daughter to schedule palliative care visit.  Daughter in agreement with home visit on 12-29-18 at 1:00 PM.

## 2018-12-29 ENCOUNTER — Other Ambulatory Visit: Payer: Medicare Other

## 2018-12-29 ENCOUNTER — Other Ambulatory Visit: Payer: Self-pay

## 2018-12-29 DIAGNOSIS — Z515 Encounter for palliative care: Secondary | ICD-10-CM

## 2018-12-29 NOTE — Telephone Encounter (Signed)
Spoke with Sarah [name], she is aware of the ok. Appointment has been changed to a Mychart video visit. Explained in detail the process of a video visit, she verbalized understanding.   Nothing further needed at time of call.

## 2018-12-29 NOTE — Progress Notes (Signed)
PATIENT NAME: Sarah [name] DOB: 1921/11/29 MRN: [id number]  PRIMARY CARE PROVIDER: [name], MD  RESPONSIBLE PARTY:  Acct ID - Guarantor Home Phone Work Phone Relationship Acct Type  [id number] - [place]ak[place],[name]  Self P/F     [address], [place], [place] [address]    PLAN OF CARE and INTERVENTIONS:               1.  GOALS OF CARE/ ADVANCE CARE PLANNING:  Patient wants to feel stronger and recover from recent pneumonia.               2.  PATIENT/CAREGIVER EDUCATION:  Education on s/s of infection, education on fall precautions, support               3.  DISEASE STATUS:SW and RN made scheduled palliative care visit. Patient sitting on sofa in her apartment. Patient has lady that clans in home with her today. Patient discharged from hospital last week after being in the hospital over a week for pneumonia. Patient reports she is feeling better but continues to feel weak. Patient states she is receiving Physical Therapy 2  times per week. Patient states physical therapist had her ambulate around the bottom floor at facility yesterday. Patient reports feeling tired and weak afterwards. Patient reports she completed her chest vest therapy this morning. Patient states daughter continues to fill her medication box. Patient denies having pain at the present time. Patient denies suffering any recent falls. Patient's vital signs are stable. Patient reports she has felt short of breath but denies having any cough. Patients breath sounds are decreased bilaterally. Patient reports her appetite is not well and patient is not pleased with food at facility. Caregiver in home states patient had french onion soup for lunch. Patient states she is planning to take a nap after palliative visit. Patient reports she has been wearing her TED hose but never received compression stockings that were ordered. Patient states she did receive a phone call to follow up from company that sent stockings but she never  received them. Patient has 1 + edema in lower extremities with erythema on lower extremities. Patient remains in agreement with palliative care services. Patient encouraged to contact palliative  care with questions or concerns.       HISTORY OF PRESENT ILLNESS:  Patient is a 85 year old female who resides at [facility].  Patient being followed by palliative care team and seen monthly and PRN.  CODE STATUS: DNR  ADVANCED DIRECTIVES: Y MOST FORM: No PPS: 50%   PHYSICAL EXAM:   VITALS: Today's Vitals   12/29/18 1320  BP: 102/66  Pulse: 88  Resp: 18  Temp: 98.6 F (37 C)  TempSrc: Temporal  SpO2: 96%  PainSc: 0-No pain    LUNGS: decreased breath sounds CARDIAC: Cor RRR  EXTREMITIES: 1+ edema SKIN: Skin color, texture, turgor normal. No rashes or lesions or erythema - lower leg(s) bilateral  NEURO: positive for gait problems, memory problems and weakness       Nilda Simmer, RN

## 2018-12-29 NOTE — Progress Notes (Signed)
COMMUNITY PALLIATIVE CARE SW NOTE  PATIENT NAME: Sarah [name] DOB: 24-Nov-1921 MRN: [id number]  PRIMARY CARE PROVIDER: [name], MD  RESPONSIBLE PARTY:  Acct ID - Guarantor Home Phone Work Phone Relationship Acct Type  [id number] - [name],[name]  Self P/F     [name], Sarah, [place] [address]     PLAN OF CARE and INTERVENTIONS:             1. GOALS OF CARE/ ADVANCE CARE PLANNING:  Patient is DNR, form is in the home. HCPOA is patient's daughters. Goal is to remain at [facility], continue working with therapy and maintain as much independence as she can. 2. SOCIAL/EMOTIONAL/SPIRITUAL ASSESSMENT/ INTERVENTIONS:  SW and RN met with patient in her apartment. Patient lives in [place]. Patient discussed recent hospitalization for pneumonia. Patient reports that she is getting better but acknowledged this will be a slow healing process. Patient is denies pain. Patient reports SOB at times, using her nebulizer regularly. Patient discussed that she is trying to rest but also work hard with PT when they come to see her. Patient is sleeping well. Patient said her appetite is fair, enjoyed soup today for lunch. SW provided emotional support, discussed care and goals, and used active and reflective listening.  3. PATIENT/CAREGIVER EDUCATION/ COPING:  Patient was alert, engaged. Patient was pleasant. Patient said she is glad to be back home. Patient expressed her feelings openly with team. Family is very supportive.  4. PERSONAL EMERGENCY PLAN:  Patient wears an emergency pendant to alert staff. Facility will follow protocol.  5. COMMUNITY RESOURCES COORDINATION/ HEALTH CARE NAVIGATION:  [facility] nursing staff help with her chest PT vest daily. Patient is receiving PT with [facility] twice a week. Patient has telehealth visit scheduled with pulmonology tomorrow. 6. FINANCIAL/LEGAL CONCERNS/INTERVENTIONS:  None.     SOCIAL HX:  Social History   Tobacco Use  . Smoking status:  Never Smoker  . Smokeless tobacco: Never Used  Substance Use Topics  . Alcohol use: Yes    Alcohol/week: 2.0 standard drinks    Types: 2 Glasses of wine per week    Comment: wine 1-2 week    CODE STATUS:   Code Status: Prior (DNR) ADVANCED DIRECTIVES: Y MOST FORM COMPLETE: No. HOSPICE EDUCATION PROVIDED: None.  PPS: Patient ambulates with walker. Patient needs standby assistance with ADLs, for safety.  I spent 20 minutes with patient/family, from 1:00-1:20pproviding education, support and consultation.    Sarah Lombard, LCSW

## 2018-12-30 ENCOUNTER — Telehealth (INDEPENDENT_AMBULATORY_CARE_PROVIDER_SITE_OTHER): Payer: Medicare Other | Admitting: Primary Care

## 2018-12-30 ENCOUNTER — Encounter: Payer: Self-pay | Admitting: Primary Care

## 2018-12-30 DIAGNOSIS — J151 Pneumonia due to Pseudomonas: Secondary | ICD-10-CM

## 2018-12-30 NOTE — Patient Instructions (Signed)
Follow-up: - 4-6 weeks follow-up in office with [name] (CXR prior)

## 2018-12-30 NOTE — Progress Notes (Signed)
Virtual Visit via Video Note  I connected with Sarah [name] on 12/30/18 at  1:30 PM EST by a video enabled telemedicine application and verified that I am speaking with the correct person using two identifiers.  Location: Patient: Home Provider: Office   I discussed the limitations of evaluation and management by telemedicine and the availability of in person appointments. The patient expressed understanding and agreed to proceed.  History of Present Illness: 85 year old female, never smoked. PMH significant for bronchiectasis, asthma, HTN, NSTEMI, stroke, chronic diastolic CHF. Patient of Dr. Lamonte [name]. Maintained on Omnicef and Azithromycin alternating during the first week of each month. Continues chronic prednisone 10mg  daily and uses chest vest.   Previous [facility] pulmonary encounter: 12/16/2018 Patient contacted today for acute virtual visit. Daughter accompanied on video call. States that her mother was not feeling well this morning around 6am, she came over at 7am and with the nurse's help they were able to get her washed up and into chair/sofa. When getting her mother out of bed the patient's lowest oxygen saturation was 80% on room air but daughter states that it quickly recovered. Daughter states that her mother sounds more congested this morning in particular with rattling in her chest. She refilled her Azithromycin yesterday that she takes once a month and is due to be taken on 12/20/18. Advised ED evaluation by EMS.  Hospital admission 12/16/18-12/23/18: Admitted for acute on chronic hypoxic respiratory failure in the setting of Pseudomonas pneumonia with exacerbation of bronchiectasis. CXR on 12/2 showed left midlung and left base airspace consolidation with moderate left pleural effusion. Bedside thoracentesis for left pleural effusion was done on 12/18/18 c/w transudate fluid. Received Azithromycin, reocephin, cefepime. BC x2, MRSA, COVID negative. Resp culture on 12/4 positive for pseudomonas  sensitive to ciprofloxacin. Discharged home self care with three days left of oral cipro.    12/30/2018 Patient contacted today for hospital follow-up video visit. Accompanied by her daughter. Patient reports that she is feeling a little better but is tired and weak. Her breathing has been very good, O2 levels are 93% on room air. No mucus production. States that this is the least she has ever coughed up. She continues to use her chest vest. Planning to resume monthly Azithromycin in January. Receiving physical therapy twice a week. Eating and drinking ok. Denies fevers.   Observations/Objective:  - Appears well, she is hard of hearing - Able to speak in full sentences  - No obvious shortness of breath, wheezing or cough   Assessment and Plan:  Pneumonia/brochiectasis exacerbation: - Hospitalized from 12/2-12/9 - Sputum culture positive for pseudomonas, treated with oral ciprofloxacin  - Clinically improved; continues to have some fatigue and weakness - O2 93% RA, afebrile with no mucus production  - Resuming monthly azithromycin in January  - Continue therapy vest daily as instructed  - FU CXR in 4 weeks to ensure resolution of pneumonia   Pleural effusion: - S/p thoracentesis on 12/18/18 with transudate fluid - O2 levels are stable and no reports of increased dyspnea   Follow Up Instructions:   4-6 week follow-up with Dr. Lamonte [name]- CXR prior  I discussed the assessment and treatment plan with the patient. The patient was provided an opportunity to ask questions and all were answered. The patient agreed with the plan and demonstrated an understanding of the instructions.   The patient was advised to call back or seek an in-person evaluation if the symptoms worsen or if the condition fails to improve as anticipated.  I provided 20 minutes of non-face-to-face time during this encounter.   Sarah Bayley, NP

## 2019-01-03 ENCOUNTER — Telehealth: Payer: Self-pay | Admitting: Critical Care Medicine

## 2019-01-03 NOTE — Telephone Encounter (Signed)
Sarah [name] daughter [name] called to ask about her mother's antihypertensives. When she was discharged from the hospital on 12/9 she was on amlodipine in addition to coreg. Today's Bps 113/84 and 95/82. She has not followed up with PCP since discharge.  Instructed to hold amlodipine and follow up with PCP tomorrow. Ok to hold one dose of coreg tonight (no h/o Afib or tachyarrhythmias)  Julian Hy, DO 01/03/19 2:45 PM [facility]

## 2019-01-04 ENCOUNTER — Telehealth: Payer: Self-pay | Admitting: Cardiovascular Disease

## 2019-01-04 DIAGNOSIS — Z1159 Encounter for screening for other viral diseases: Secondary | ICD-10-CM | POA: Diagnosis not present

## 2019-01-04 DIAGNOSIS — Z20828 Contact with and (suspected) exposure to other viral communicable diseases: Secondary | ICD-10-CM | POA: Diagnosis not present

## 2019-01-04 MED ORDER — AMLODIPINE BESYLATE 10 MG PO TABS: 5.0000 mg | ORAL_TABLET | Freq: Every day | ORAL | 3 refills | Status: DC | PRN

## 2019-01-04 NOTE — Telephone Encounter (Signed)
New Message   *STAT* If patient is at the pharmacy, call can be transferred to refill team.   1. Which medications need to be refilled? (please list name of each medication and dose if known) clopidogrel (PLAVIX) 75 MG tablet  2. Which pharmacy/location (including street and city if local pharmacy) is medication to be sent to? [facility], [place] - [address] AT [place]   3. Do they need a 30 day or 90 day supply? 90 day

## 2019-01-04 NOTE — Telephone Encounter (Signed)
New Message  Pt c/o BP issue: STAT if pt c/o blurred vision, one-sided weakness or slurred speech  1. What are your last 5 BP readings? 110/75 HR 107, 93/75 HR 93,   2. Are you having any other symptoms (ex. Dizziness, headache, blurred vision, passed out)? Fatigue  3. What is your BP issue? Bp is low since she has started taking amlodipine daily. Patient's daughter states that she would like to stop the amlodipine and go back to just taking the carvedilol and giving xanax for anxiety. Please give patient's daughter a call back.

## 2019-01-04 NOTE — Telephone Encounter (Signed)
Spoke with patient's daughter, [name], who states patient complains of fatigue since starting amlodipine 10 mg at time of discharge from the hospital. States patient states it is difficult to walk a short distance due to fatigue which prompted daughter to check her BP (attached). [name] would like to d/c amlodipine and use it only as needed for elevated BP. I advised that she may split the 10 mg tablet in half and give patient 5 mg. She is aware to monitor for consistently elevated BP readings; states she does not get concerned over one elevated reading.  I advised she may give the additional 5 mg later in the day if BP is persistently elevated for a max dose of 10 mg daily. [name] verbalized understanding and agreement and thanked me for the call.

## 2019-01-05 ENCOUNTER — Other Ambulatory Visit: Payer: Self-pay | Admitting: Nurse Practitioner

## 2019-01-11 DIAGNOSIS — Z20828 Contact with and (suspected) exposure to other viral communicable diseases: Secondary | ICD-10-CM | POA: Diagnosis not present

## 2019-01-11 DIAGNOSIS — Z1159 Encounter for screening for other viral diseases: Secondary | ICD-10-CM | POA: Diagnosis not present

## 2019-01-18 DIAGNOSIS — Z20828 Contact with and (suspected) exposure to other viral communicable diseases: Secondary | ICD-10-CM | POA: Diagnosis not present

## 2019-01-18 DIAGNOSIS — Z1159 Encounter for screening for other viral diseases: Secondary | ICD-10-CM | POA: Diagnosis not present

## 2019-01-19 DIAGNOSIS — J471 Bronchiectasis with (acute) exacerbation: Secondary | ICD-10-CM | POA: Diagnosis not present

## 2019-01-19 DIAGNOSIS — M6281 Muscle weakness (generalized): Secondary | ICD-10-CM | POA: Diagnosis not present

## 2019-01-19 DIAGNOSIS — R2681 Unsteadiness on feet: Secondary | ICD-10-CM | POA: Diagnosis not present

## 2019-01-19 DIAGNOSIS — R262 Difficulty in walking, not elsewhere classified: Secondary | ICD-10-CM | POA: Diagnosis not present

## 2019-01-21 DIAGNOSIS — R262 Difficulty in walking, not elsewhere classified: Secondary | ICD-10-CM | POA: Diagnosis not present

## 2019-01-21 DIAGNOSIS — Z1159 Encounter for screening for other viral diseases: Secondary | ICD-10-CM | POA: Diagnosis not present

## 2019-01-21 DIAGNOSIS — M6281 Muscle weakness (generalized): Secondary | ICD-10-CM | POA: Diagnosis not present

## 2019-01-21 DIAGNOSIS — Z20828 Contact with and (suspected) exposure to other viral communicable diseases: Secondary | ICD-10-CM | POA: Diagnosis not present

## 2019-01-21 DIAGNOSIS — J471 Bronchiectasis with (acute) exacerbation: Secondary | ICD-10-CM | POA: Diagnosis not present

## 2019-01-21 DIAGNOSIS — R2681 Unsteadiness on feet: Secondary | ICD-10-CM | POA: Diagnosis not present

## 2019-01-25 DIAGNOSIS — Z1159 Encounter for screening for other viral diseases: Secondary | ICD-10-CM | POA: Diagnosis not present

## 2019-01-25 DIAGNOSIS — Z20828 Contact with and (suspected) exposure to other viral communicable diseases: Secondary | ICD-10-CM | POA: Diagnosis not present

## 2019-01-28 ENCOUNTER — Telehealth: Payer: Self-pay

## 2019-01-28 DIAGNOSIS — M6281 Muscle weakness (generalized): Secondary | ICD-10-CM | POA: Diagnosis not present

## 2019-01-28 DIAGNOSIS — Z20828 Contact with and (suspected) exposure to other viral communicable diseases: Secondary | ICD-10-CM | POA: Diagnosis not present

## 2019-01-28 DIAGNOSIS — R262 Difficulty in walking, not elsewhere classified: Secondary | ICD-10-CM | POA: Diagnosis not present

## 2019-01-28 DIAGNOSIS — Z1159 Encounter for screening for other viral diseases: Secondary | ICD-10-CM | POA: Diagnosis not present

## 2019-01-28 DIAGNOSIS — R2681 Unsteadiness on feet: Secondary | ICD-10-CM | POA: Diagnosis not present

## 2019-01-28 DIAGNOSIS — J471 Bronchiectasis with (acute) exacerbation: Secondary | ICD-10-CM | POA: Diagnosis not present

## 2019-01-28 NOTE — Telephone Encounter (Signed)
Telephone call to patients daughter Sharyl Nimrod.  Daughter did not answer phone. RN left message requesting call back to schedule palliative visit.

## 2019-01-29 ENCOUNTER — Telehealth: Payer: Self-pay

## 2019-01-29 NOTE — Telephone Encounter (Signed)
Telephone call from patients daughter Sarah [name] to schedule palliative care visit.  Daughter and patient in agreement with RN making home visit 02-05-19 at 1:45 PM.

## 2019-01-29 NOTE — Telephone Encounter (Signed)
Telephone call to patients daughter to schedule palliative care visit.  Daughter did not answer phone.  RN left message requesting call back to schedule palliative care visit.

## 2019-02-01 ENCOUNTER — Ambulatory Visit: Payer: Medicare Other | Admitting: Emergency Medicine

## 2019-02-01 DIAGNOSIS — Z1159 Encounter for screening for other viral diseases: Secondary | ICD-10-CM | POA: Diagnosis not present

## 2019-02-01 DIAGNOSIS — Z20828 Contact with and (suspected) exposure to other viral communicable diseases: Secondary | ICD-10-CM | POA: Diagnosis not present

## 2019-02-02 ENCOUNTER — Ambulatory Visit (INDEPENDENT_AMBULATORY_CARE_PROVIDER_SITE_OTHER): Payer: Medicare Other | Admitting: Podiatry

## 2019-02-02 ENCOUNTER — Encounter: Payer: Self-pay | Admitting: Podiatry

## 2019-02-02 ENCOUNTER — Other Ambulatory Visit: Payer: Self-pay

## 2019-02-02 DIAGNOSIS — B351 Tinea unguium: Secondary | ICD-10-CM

## 2019-02-02 DIAGNOSIS — M79674 Pain in right toe(s): Secondary | ICD-10-CM | POA: Diagnosis not present

## 2019-02-02 DIAGNOSIS — L84 Corns and callosities: Secondary | ICD-10-CM

## 2019-02-02 DIAGNOSIS — M79675 Pain in left toe(s): Secondary | ICD-10-CM | POA: Diagnosis not present

## 2019-02-02 DIAGNOSIS — Z9229 Personal history of other drug therapy: Secondary | ICD-10-CM

## 2019-02-02 NOTE — Patient Instructions (Signed)
Corns and Calluses Corns are small areas of thickened skin that occur on the top, sides, or tip of a toe. They contain a cone-shaped core with a point that can press on a nerve below. This causes pain.  Calluses are areas of thickened skin that can occur anywhere on the body, including the hands, fingers, palms, soles of the feet, and heels. Calluses are usually larger than corns. What are the causes? Corns and calluses are caused by rubbing (friction) or pressure, such as from shoes that are too tight or do not fit properly. What increases the risk? Corns are more likely to develop in people who have misshapen toes (toe deformities), such as hammer toes. Calluses can occur with friction to any area of the skin. They are more likely to develop in people who:  Work with their hands.  Wear shoes that fit poorly, are too tight, or are high-heeled.  Have toe deformities. What are the signs or symptoms? Symptoms of a corn or callus include:  A hard growth on the skin.  Pain or tenderness under the skin.  Redness and swelling.  Increased discomfort while wearing tight-fitting shoes, if your feet are affected. If a corn or callus becomes infected, symptoms may include:  Redness and swelling that gets worse.  Pain.  Fluid, blood, or pus draining from the corn or callus. How is this diagnosed? Corns and calluses may be diagnosed based on your symptoms, your medical history, and a physical exam. How is this treated? Treatment for corns and calluses may include:  Removing the cause of the friction or pressure. This may involve: ? Changing your shoes. ? Wearing shoe inserts (orthotics) or other protective layers in your shoes, such as a corn pad. ? Wearing gloves.  Applying medicine to the skin (topical medicine) to help soften skin in the hardened, thickened areas.  Removing layers of dead skin with a file to reduce the size of the corn or callus.  Removing the corn or callus with a  scalpel or laser.  Taking antibiotic medicines, if your corn or callus is infected.  Having surgery, if a toe deformity is the cause. Follow these instructions at home:   Take over-the-counter and prescription medicines only as told by your health care provider.  If you were prescribed an antibiotic, take it as told by your health care provider. Do not stop taking it even if your condition starts to improve.  Wear shoes that fit well. Avoid wearing high-heeled shoes and shoes that are too tight or too loose.  Wear any padding, protective layers, gloves, or orthotics as told by your health care provider.  Soak your hands or feet and then use a file or pumice stone to soften your corn or callus. Do this as told by your health care provider.  Check your corn or callus every day for symptoms of infection. Contact a health care provider if you:  Notice that your symptoms do not improve with treatment.  Have redness or swelling that gets worse.  Notice that your corn or callus becomes painful.  Have fluid, blood, or pus coming from your corn or callus.  Have new symptoms. Summary  Corns are small areas of thickened skin that occur on the top, sides, or tip of a toe.  Calluses are areas of thickened skin that can occur anywhere on the body, including the hands, fingers, palms, and soles of the feet. Calluses are usually larger than corns.  Corns and calluses are caused by   rubbing (friction) or pressure, such as from shoes that are too tight or do not fit properly.  Treatment may include wearing any padding, protective layers, gloves, or orthotics as told by your health care provider. This information is not intended to replace advice given to you by your health care provider. Make sure you discuss any questions you have with your health care provider. Document Revised: 04/22/2018 Document Reviewed: 11/13/2016 Elsevier Patient Education  2020 Elsevier Inc.  Hammer Toe  Hammer  toe is a change in the shape (a deformity) of your toe. The deformity causes the middle joint of your toe to stay bent. This causes pain, especially when you are wearing shoes. Hammer toe starts gradually. At first, the toe can be straightened. Gradually over time, the deformity becomes stiff and permanent. Early treatments to keep the toe straight may relieve pain. As the deformity becomes stiff and permanent, surgery may be needed to straighten the toe. What are the causes? Hammer toe is caused by abnormal bending of the toe joint that is closest to your foot. It happens gradually over time. This pulls on the muscles and connections (tendons) of the toe joint, making them weak and stiff. It is often related to wearing shoes that are too short or narrow and do not let your toes straighten. What increases the risk? You may be at greater risk for hammer toe if you:  Are female.  Are older.  Wear shoes that are too small.  Wear high-heeled shoes that pinch your toes.  Are a ballet dancer.  Have a second toe that is longer than your big toe (first toe).  Injure your foot or toe.  Have arthritis.  Have a family history of hammer toe.  Have a nerve or muscle disorder. What are the signs or symptoms? The main symptoms of this condition are pain and deformity of the toe. The pain is worse when wearing shoes, walking, or running. Other symptoms may include:  Corns or calluses over the bent part of the toe or between the toes.  Redness and a burning feeling on the toe.  An open sore that forms on the top of the toe.  Not being able to straighten the toe. How is this diagnosed? This condition is diagnosed based on your symptoms and a physical exam. During the exam, your health care provider will try to straighten your toe to see how stiff the deformity is. You may also have tests, such as:  A blood test to check for rheumatoid arthritis.  An X-ray to show how severe the deformity  is. How is this treated? Treatment for this condition will depend on how stiff the deformity is. Surgery is often needed. However, sometimes a hammer toe can be straightened without surgery. Treatments that do not involve surgery include:  Taping the toe into a straightened position.  Using pads and cushions to protect the toe (orthotics).  Wearing shoes that provide enough room for the toes.  Doing toe-stretching exercises at home.  Taking an NSAID to reduce pain and swelling. If these treatments do not help or the toe cannot be straightened, surgery is the next option. The most common surgeries used to straighten a hammer toe include:  Arthroplasty. In this procedure, part of the joint is removed, and that allows the toe to straighten.  Fusion. In this procedure, cartilage between the two bones of the joint is taken out and the bones are fused together into one longer bone.  Implantation. In this   procedure, part of the bone is removed and replaced with an implant to let the toe move again.  Flexor tendon transfer. In this procedure, the tendons that curl the toes down (flexor tendons) are repositioned. Follow these instructions at home:  Take over-the-counter and prescription medicines only as told by your health care provider.  Do toe straightening and stretching exercises as told by your health care provider.  Keep all follow-up visits as told by your health care provider. This is important. How is this prevented?  Wear shoes that give your toes enough room and do not cause pain.  Do not wear high-heeled shoes. Contact a health care provider if:  Your pain gets worse.  Your toe becomes red or swollen.  You develop an open sore on your toe. This information is not intended to replace advice given to you by your health care provider. Make sure you discuss any questions you have with your health care provider. Document Revised: 12/13/2016 Document Reviewed:  04/26/2015 Elsevier Patient Education  2020 Elsevier Inc.  

## 2019-02-04 DIAGNOSIS — Z23 Encounter for immunization: Secondary | ICD-10-CM | POA: Diagnosis not present

## 2019-02-05 ENCOUNTER — Other Ambulatory Visit: Payer: Self-pay

## 2019-02-05 ENCOUNTER — Other Ambulatory Visit: Payer: Medicare Other

## 2019-02-05 DIAGNOSIS — Z515 Encounter for palliative care: Secondary | ICD-10-CM

## 2019-02-05 DIAGNOSIS — R262 Difficulty in walking, not elsewhere classified: Secondary | ICD-10-CM | POA: Diagnosis not present

## 2019-02-05 DIAGNOSIS — J471 Bronchiectasis with (acute) exacerbation: Secondary | ICD-10-CM | POA: Diagnosis not present

## 2019-02-05 DIAGNOSIS — R2681 Unsteadiness on feet: Secondary | ICD-10-CM | POA: Diagnosis not present

## 2019-02-05 DIAGNOSIS — M6281 Muscle weakness (generalized): Secondary | ICD-10-CM | POA: Diagnosis not present

## 2019-02-05 NOTE — Progress Notes (Signed)
PATIENT NAME: Sarah [name] DOB: 1921/03/23 MRN: [id number]  PRIMARY CARE PROVIDER: [name], MD  RESPONSIBLE PARTY:  Acct ID - Guarantor Home Phone Work Phone Relationship Acct Type  [id number] - [place]on[place],[name] [phone]  Self P/F     [address], [place], [place] [address]    PLAN OF CARE and INTERVENTIONS:               1.  GOALS OF CARE/ ADVANCE CARE PLANNING:  Remain at Independent Living facility and recover from pneumonia patient had in December.                 2.  PATIENT/CAREGIVER EDUCATION:  Education on fall precautions, education on s/s of infection, education to take meds as directed, support               3.  DISEASE STATUS:RN made scheduled visit to see patient at [facility]. Patient setting on sofa when nurse arrives. Patient denies having any pain but reports she feels tired today. Patient informs  RN "to be 86 years old" she has "good days and bad days." Patient was hospitalized in December due to pneumonia. Patient continues to have chest vest therapy daily. Patient receiving PT through PT at [facility]. Patient having increased shortness of breath and is short of breath with talking today. Patient did nebulizer treatment prior to the nurses arrival. Patients current  weight is 117 lbs. Patient has coarse breath sounds with rhonchi throughout lungs. Patient reports her appetite is coming back. Patient reports she has MD appointment with pulmonologist next week. Patient has on compression stockings to lower extremities. Patient has history of cellulitis but is not having any cellulitis symptoms at the present time. Patient reports she has been sleeping well at night. Patient is able to ambulate inside her apartment without assistive device but has cane and rollator walker when she goes outside her apartment. Patient's children remain very supportive and active and patients care. Support provided to patient. Patient encouraged to contact palliative care team  with questions or concerns.       HISTORY OF PRESENT ILLNESS:  Patient is a 86 year old female who resides at [facility].  Patient being followed by palliative care team and is seen monthly and PRN.    CODE STATUS: DNR  ADVANCED DIRECTIVES: Y MOST FORM: No PPS: 50%   PHYSICAL EXAM:   VITALS: Today's Vitals   02/05/19 1337  BP: 138/90  Pulse: (!) 117  Resp: (!) 22  Temp: 98.5 F (36.9 C)  TempSrc: Temporal  SpO2: (!) 89%  Weight: 117 lb (53.1 kg)  PainSc: 0-No pain    LUNGS: coarse sounds heard, scattered rales bilaterally CARDIAC: Cor Tachy  EXTREMITIES: Trace edema SKIN: Skin color, texture, turgor normal. No rashes or lesions  NEURO: positive for memory problems and weakness       Glory Rosebush, RN

## 2019-02-07 NOTE — Progress Notes (Signed)
Subjective: Sarah [name] presents today for follow up of long term blood thinner, Plavix, and presents today with painful, discolored, thick toenails which interfere with daily activities.   Allergies  Allergen Reactions  . Codeine Anaphylaxis  . Hydrocodone Nausea And Vomiting and Other (See Comments)    SYNCOPE AND BRADYCARDIA  . Isosorbide Nitrate Other (See Comments)    BRADYCARDIA  . Oxycodone Palpitations    Rapid heart beat  . Clarithromycin Nausea And Vomiting    Has patient had a PCN reaction causing immediate rash, facial/tongue/throat swelling, SOB or lightheadedness with hypotension: Unknown Has patient had a PCN reaction causing severe rash involving mucus membranes or skin necrosis: Unknown Has patient had a PCN reaction that required hospitalization: Unknown Has patient had a PCN reaction occurring within the last 10 years: Unknown If all of the above answers are "NO", then may proceed with Cephalosporin use.   . Fiorinal [Butalbital-Aspirin-Caffeine] Swelling and Other (See Comments)    Facial swelling   . Levofloxacin Nausea And Vomiting and Other (See Comments)    SYNCOPE ALSO  . Augmentin [Amoxicillin-Pot Clavulanate] Diarrhea  . Doxycycline     Sweats and aches     Objective: There were no vitals filed for this visit.  Vascular Examination:  capillary fill time to digits <3s b/l, palpable DP pulses b/l, palpable PT pulses b/l, pedal hair absent b/l and skin temperature gradient within normal limits b/l  Dermatological Examination: Pedal skin is thin shiny, atrophic bilaterally, no open wounds bilaterally, no interdigital macerations bilaterally, toenails 1-5 b/l elongated, dystrophic, thickened, crumbly with subungual debris and hyperkeratotic lesion(s) dorsal 5th PIPJ right foot, right 3rd digit, dorsal PIPJ and distal tip left 4th digit.  No erythema, no edema, no drainage, no flocculence  Musculoskeletal: normal muscle strength 5/5 to all lower  extremity muscle groups bilaterally, no pain crepitus or joint limitation noted with ROM b/l and hammertoes noted to the left, right, 2nd toe, 3rd toe, 4th toe, 5th toe  Neurological: sensation intact 5/5 intact bilaterally with 10g monofilament  Assessment: 1. Pain due to onychomycosis of toenails of both feet   2. Corns   3. Hx of long term use of blood thinners   4. Pain in left toe(s)   5. Pain in right toe(s)      Plan: -Toenails 1-5 b/l were debrided in length and girth without iatrogenic bleeding. -The above corns and calluses were debrided without complication or incident. Total number debrided =4 right 5th digit, right 3rd digit, and left 4th digit distal tip/PIPJ -Patient to continue soft, supportive shoe gear daily. -Patient to report any pedal injuries to medical professional immediately. -Patient/POA to call should there be question/concern in the interim.  Return in about 9 weeks (around 04/06/2019).

## 2019-02-08 DIAGNOSIS — R2681 Unsteadiness on feet: Secondary | ICD-10-CM | POA: Diagnosis not present

## 2019-02-08 DIAGNOSIS — Z20828 Contact with and (suspected) exposure to other viral communicable diseases: Secondary | ICD-10-CM | POA: Diagnosis not present

## 2019-02-08 DIAGNOSIS — M6281 Muscle weakness (generalized): Secondary | ICD-10-CM | POA: Diagnosis not present

## 2019-02-08 DIAGNOSIS — Z1159 Encounter for screening for other viral diseases: Secondary | ICD-10-CM | POA: Diagnosis not present

## 2019-02-08 DIAGNOSIS — J471 Bronchiectasis with (acute) exacerbation: Secondary | ICD-10-CM | POA: Diagnosis not present

## 2019-02-08 DIAGNOSIS — R262 Difficulty in walking, not elsewhere classified: Secondary | ICD-10-CM | POA: Diagnosis not present

## 2019-02-09 ENCOUNTER — Encounter: Payer: Self-pay | Admitting: Emergency Medicine

## 2019-02-09 ENCOUNTER — Ambulatory Visit (INDEPENDENT_AMBULATORY_CARE_PROVIDER_SITE_OTHER): Payer: Medicare Other | Admitting: Emergency Medicine

## 2019-02-09 ENCOUNTER — Ambulatory Visit (INDEPENDENT_AMBULATORY_CARE_PROVIDER_SITE_OTHER): Payer: Medicare Other

## 2019-02-09 ENCOUNTER — Other Ambulatory Visit: Payer: Self-pay

## 2019-02-09 DIAGNOSIS — I10 Essential (primary) hypertension: Secondary | ICD-10-CM | POA: Diagnosis not present

## 2019-02-09 DIAGNOSIS — J4521 Mild intermittent asthma with (acute) exacerbation: Secondary | ICD-10-CM | POA: Diagnosis not present

## 2019-02-09 DIAGNOSIS — J479 Bronchiectasis, uncomplicated: Secondary | ICD-10-CM | POA: Diagnosis not present

## 2019-02-09 DIAGNOSIS — J189 Pneumonia, unspecified organism: Secondary | ICD-10-CM | POA: Diagnosis not present

## 2019-02-09 DIAGNOSIS — J151 Pneumonia due to Pseudomonas: Secondary | ICD-10-CM | POA: Diagnosis not present

## 2019-02-09 NOTE — Progress Notes (Signed)
   Subjective:    Patient ID: [name], female    DOB: [birth year], 86 y.o.   MRN: [id number]  HPI  ROV 10/08/2018 --Ms. Waters is 89 with a history of chronic asthmatic bronchitis and bronchiectasis.  She has chronic cough with both lower and upper airway involvement.  She is currently managed on prednisone 10 mg daily and rotating antibiotics- azithromycin, Omnicef.  She uses nebulized albuterol about once a day, loratadine. She is off flutter valve, chest vest right now. Over the last 2 months she has also been treated with keflex, bactrim for her LE cellulitis.   ROV 02/09/2019 --very pleasant 86 year old woman whom I have followed for bronchiectasis (colonized with Pseudomonas), chronic cough and chronic asthmatic bronchitis.  Her cough has both lower and upper airway involvement.  We have been managing her on daily prednisone 10 mg, rotating antibiotics (azithromycin, Omnicef).  She was unfortunately admitted in early December with a left lower lobe pneumonia. Repeat CXR today reviewed by me shows . She is back on using her smart vest daily. She is not on any reliable guaifenesin right now.  She uses albuterol about 1-2x a day, often to help get up mucous.  Amlodipine has been stopped. Remains on carvedilol, BP 108/72. She typically feels bad when her BP is below the 130's     Review of Systems As above       Objective:   Physical Exam  Vitals:   02/09/19 1450  BP: 108/72  Pulse: (!) 101  Temp: 98.6 F (37 C)  TempSrc: Temporal  SpO2: 93%    GEN: A/Ox3, a bit anxious, NAD, elderly and thin. Weak. In a wheelchair   HEENT:  OP clear  NECK:  No stridor  RESP distant, no wheeze, B insp rhonchi, crackles, no wheeze  CARD:  Regular, no M  Musco: some chronic venous stasis, no edema.   Neuro: awake, poor hearing, oriented       Assessment & Plan:  BRONCHIECTASIS Continue to use your chest vest as you have been doing it Start guaifenesin 600 mg twice a day Go ahead  and start your cycled antibiotics early (next is Omnicef) Follow with a phone or video visit with Dr. Delton Coombes in about 1 month.  Asthma Please try increasing your albuterol nebulizer to three times a day. Continue prednisone 10 mg daily  HTN (hypertension) She may need to establish some holding parameters for her carvedilol.  She feels "bad" when her blood pressure is less than 130.  I asked her to talk to Dr. Elease Hashimoto about this.  Pneumonia of left lung due to infectious organism Recent hospitalization for community-acquired pneumonia left lower lobe.  Repeat chest x-ray today has been done but the images are still pending.  I will review to look for radiographic clearance.  Levy Pupa, MD, PhD 02/09/2019, 3:25 PM [facility] [phone] or if no answer [phone]

## 2019-02-09 NOTE — Assessment & Plan Note (Signed)
Recent hospitalization for community-acquired pneumonia left lower lobe.  Repeat chest x-ray today has been done but the images are still pending.  I will review to look for radiographic clearance.

## 2019-02-09 NOTE — Patient Instructions (Signed)
Please try increasing your albuterol nebulizer to three times a day. Continue prednisone 10 mg daily Continue to use your chest vest as you have been doing it Continue your Protonix as you have been taking it. Start guaifenesin 600 mg twice a day Go ahead and start your cycled antibiotics early (next is [place]) Speak with Dr. Elease Hashimoto about instructions for when you should hold your carvedilol.  For example if your blood pressure is below 130, he may want you to hold off on taking the next dose. Follow with a phone or video visit with Dr. Delton Coombes in about 1 month.

## 2019-02-09 NOTE — Progress Notes (Signed)
You saw her today I believe

## 2019-02-09 NOTE — Assessment & Plan Note (Addendum)
Continue to use your chest vest as you have been doing it Start guaifenesin 600 mg twice a day Go ahead and start your cycled antibiotics early (next is Omnicef) Follow with a phone or video visit with Dr. Delton Coombes in about 1 month.

## 2019-02-09 NOTE — Assessment & Plan Note (Signed)
Please try increasing your albuterol nebulizer to three times a day. Continue prednisone 10 mg daily

## 2019-02-09 NOTE — Assessment & Plan Note (Signed)
She may need to establish some holding parameters for her carvedilol.  She feels "bad" when her blood pressure is less than 130.  I asked her to talk to Dr. Elease Hashimoto about this.

## 2019-02-12 DIAGNOSIS — J471 Bronchiectasis with (acute) exacerbation: Secondary | ICD-10-CM | POA: Diagnosis not present

## 2019-02-12 DIAGNOSIS — R2681 Unsteadiness on feet: Secondary | ICD-10-CM | POA: Diagnosis not present

## 2019-02-12 DIAGNOSIS — M6281 Muscle weakness (generalized): Secondary | ICD-10-CM | POA: Diagnosis not present

## 2019-02-12 DIAGNOSIS — R262 Difficulty in walking, not elsewhere classified: Secondary | ICD-10-CM | POA: Diagnosis not present

## 2019-02-15 DIAGNOSIS — Z20828 Contact with and (suspected) exposure to other viral communicable diseases: Secondary | ICD-10-CM | POA: Diagnosis not present

## 2019-02-15 DIAGNOSIS — Z1159 Encounter for screening for other viral diseases: Secondary | ICD-10-CM | POA: Diagnosis not present

## 2019-02-18 DIAGNOSIS — R2681 Unsteadiness on feet: Secondary | ICD-10-CM | POA: Diagnosis not present

## 2019-02-18 DIAGNOSIS — R262 Difficulty in walking, not elsewhere classified: Secondary | ICD-10-CM | POA: Diagnosis not present

## 2019-02-18 DIAGNOSIS — J471 Bronchiectasis with (acute) exacerbation: Secondary | ICD-10-CM | POA: Diagnosis not present

## 2019-02-18 DIAGNOSIS — M6281 Muscle weakness (generalized): Secondary | ICD-10-CM | POA: Diagnosis not present

## 2019-02-22 DIAGNOSIS — H401122 Primary open-angle glaucoma, left eye, moderate stage: Secondary | ICD-10-CM | POA: Diagnosis not present

## 2019-02-22 DIAGNOSIS — H401131 Primary open-angle glaucoma, bilateral, mild stage: Secondary | ICD-10-CM | POA: Diagnosis not present

## 2019-02-22 DIAGNOSIS — H353122 Nonexudative age-related macular degeneration, left eye, intermediate dry stage: Secondary | ICD-10-CM | POA: Diagnosis not present

## 2019-02-22 DIAGNOSIS — Z1159 Encounter for screening for other viral diseases: Secondary | ICD-10-CM | POA: Diagnosis not present

## 2019-02-22 DIAGNOSIS — Z20828 Contact with and (suspected) exposure to other viral communicable diseases: Secondary | ICD-10-CM | POA: Diagnosis not present

## 2019-02-23 DIAGNOSIS — R262 Difficulty in walking, not elsewhere classified: Secondary | ICD-10-CM | POA: Diagnosis not present

## 2019-02-23 DIAGNOSIS — J471 Bronchiectasis with (acute) exacerbation: Secondary | ICD-10-CM | POA: Diagnosis not present

## 2019-02-23 DIAGNOSIS — R2681 Unsteadiness on feet: Secondary | ICD-10-CM | POA: Diagnosis not present

## 2019-02-23 DIAGNOSIS — M6281 Muscle weakness (generalized): Secondary | ICD-10-CM | POA: Diagnosis not present

## 2019-02-25 DIAGNOSIS — R262 Difficulty in walking, not elsewhere classified: Secondary | ICD-10-CM | POA: Diagnosis not present

## 2019-02-25 DIAGNOSIS — M6281 Muscle weakness (generalized): Secondary | ICD-10-CM | POA: Diagnosis not present

## 2019-02-25 DIAGNOSIS — R2681 Unsteadiness on feet: Secondary | ICD-10-CM | POA: Diagnosis not present

## 2019-02-25 DIAGNOSIS — J471 Bronchiectasis with (acute) exacerbation: Secondary | ICD-10-CM | POA: Diagnosis not present

## 2019-03-01 ENCOUNTER — Other Ambulatory Visit: Payer: Self-pay

## 2019-03-01 ENCOUNTER — Other Ambulatory Visit: Payer: Medicare Other | Admitting: Licensed Clinical Social Worker

## 2019-03-01 DIAGNOSIS — R262 Difficulty in walking, not elsewhere classified: Secondary | ICD-10-CM | POA: Diagnosis not present

## 2019-03-01 DIAGNOSIS — J471 Bronchiectasis with (acute) exacerbation: Secondary | ICD-10-CM | POA: Diagnosis not present

## 2019-03-01 DIAGNOSIS — Z20828 Contact with and (suspected) exposure to other viral communicable diseases: Secondary | ICD-10-CM | POA: Diagnosis not present

## 2019-03-01 DIAGNOSIS — R2681 Unsteadiness on feet: Secondary | ICD-10-CM | POA: Diagnosis not present

## 2019-03-01 DIAGNOSIS — Z1159 Encounter for screening for other viral diseases: Secondary | ICD-10-CM | POA: Diagnosis not present

## 2019-03-01 DIAGNOSIS — M6281 Muscle weakness (generalized): Secondary | ICD-10-CM | POA: Diagnosis not present

## 2019-03-01 DIAGNOSIS — Z515 Encounter for palliative care: Secondary | ICD-10-CM

## 2019-03-03 DIAGNOSIS — R2681 Unsteadiness on feet: Secondary | ICD-10-CM | POA: Diagnosis not present

## 2019-03-03 DIAGNOSIS — J471 Bronchiectasis with (acute) exacerbation: Secondary | ICD-10-CM | POA: Diagnosis not present

## 2019-03-03 DIAGNOSIS — M6281 Muscle weakness (generalized): Secondary | ICD-10-CM | POA: Diagnosis not present

## 2019-03-03 DIAGNOSIS — R262 Difficulty in walking, not elsewhere classified: Secondary | ICD-10-CM | POA: Diagnosis not present

## 2019-03-03 NOTE — Progress Notes (Signed)
COMMUNITY PALLIATIVE CARE SW NOTE  PATIENT NAME: Sarah [name] DOB: Nov 06, 1921 MRN: [id number]  PRIMARY CARE PROVIDER: [name], MD  RESPONSIBLE PARTY:  Acct ID - Guarantor Home Phone Work Phone Relationship Acct Type  [id number] Jonetta Osgood* [phone]  Self P/F     [address] [place], [place], [place] [address]   Due to the COVID-19 crisis, this virtual check-in visit was done via telephone from my office and it was initiated and consent given by this patient and or family.  PLAN OF CARE and INTERVENTIONS:             1. GOALS OF CARE/ ADVANCE CARE PLANNING:  Goal is for patient to remain at [facility].  Patient has a DNR. 2. SOCIAL/EMOTIONAL/SPIRITUAL ASSESSMENT/ INT SW conducted a virtual check-in visit with patient's daughter, Sarah [name].  She reports patient is doing "great" and has been recovering from her pneumonia in December.  Patient recently had three teeth removed, which decreased her ability to eat. Sarah [name] stated she has lost weight and is now 115 pounds.  Patient is scheduled to receive her second COVID vaccine on Thursday.  Family continues to be supportive. 3. PATIENT/CAREGIVER EDUCATION/ COPING:  Daughter, Sarah [name], copes by problem-solving. 4. PERSONAL EMERGENCY PLAN:  Facility will contact EMS.  She also wears an emergency pendant. 5. COMMUNITY RESOURCES COORDINATION/ HEALTH CARE NAVIGATION:  [facility] nursing staff at the facility assist patient.  She also is receiving PT from [facility]. 6. FINANCIAL/LEGAL CONCERNS/INTERVENTIONS:  None.     SOCIAL HX:  Social History   Tobacco Use  . Smoking status: Never Smoker  . Smokeless tobacco: Never Used  Substance Use Topics  . Alcohol use: Yes    Alcohol/week: 2.0 standard drinks    Types: 2 Glasses of wine per week    Comment: wine 1-2 week    CODE STATUS:  DNR  ADVANCED DIRECTIVES: Y MOST FORM COMPLETE:  N HOSPICE EDUCATION PROVIDED:  N PPS:  Patient's appetite has been increasing.  She  requires standby assistance with ADLs. Duration of visit and documentation:  30 minutes.      Vella Kohler Jameon Deller, LCSW

## 2019-03-04 DIAGNOSIS — Z23 Encounter for immunization: Secondary | ICD-10-CM | POA: Diagnosis not present

## 2019-03-08 DIAGNOSIS — Z1159 Encounter for screening for other viral diseases: Secondary | ICD-10-CM | POA: Diagnosis not present

## 2019-03-08 DIAGNOSIS — Z20828 Contact with and (suspected) exposure to other viral communicable diseases: Secondary | ICD-10-CM | POA: Diagnosis not present

## 2019-03-10 DIAGNOSIS — R2681 Unsteadiness on feet: Secondary | ICD-10-CM | POA: Diagnosis not present

## 2019-03-10 DIAGNOSIS — J471 Bronchiectasis with (acute) exacerbation: Secondary | ICD-10-CM | POA: Diagnosis not present

## 2019-03-10 DIAGNOSIS — M6281 Muscle weakness (generalized): Secondary | ICD-10-CM | POA: Diagnosis not present

## 2019-03-10 DIAGNOSIS — R262 Difficulty in walking, not elsewhere classified: Secondary | ICD-10-CM | POA: Diagnosis not present

## 2019-03-11 DIAGNOSIS — M6281 Muscle weakness (generalized): Secondary | ICD-10-CM | POA: Diagnosis not present

## 2019-03-11 DIAGNOSIS — R2681 Unsteadiness on feet: Secondary | ICD-10-CM | POA: Diagnosis not present

## 2019-03-11 DIAGNOSIS — J471 Bronchiectasis with (acute) exacerbation: Secondary | ICD-10-CM | POA: Diagnosis not present

## 2019-03-11 DIAGNOSIS — R262 Difficulty in walking, not elsewhere classified: Secondary | ICD-10-CM | POA: Diagnosis not present

## 2019-03-15 ENCOUNTER — Encounter: Payer: Self-pay | Admitting: Emergency Medicine

## 2019-03-15 ENCOUNTER — Telehealth (INDEPENDENT_AMBULATORY_CARE_PROVIDER_SITE_OTHER): Payer: Medicare Other | Admitting: Emergency Medicine

## 2019-03-15 DIAGNOSIS — H401131 Primary open-angle glaucoma, bilateral, mild stage: Secondary | ICD-10-CM | POA: Diagnosis not present

## 2019-03-15 DIAGNOSIS — J479 Bronchiectasis, uncomplicated: Secondary | ICD-10-CM

## 2019-03-15 DIAGNOSIS — H401122 Primary open-angle glaucoma, left eye, moderate stage: Secondary | ICD-10-CM | POA: Diagnosis not present

## 2019-03-15 NOTE — Progress Notes (Signed)
Virtual Visit via Video Note  I connected with [employer] on 03/15/19 at 11:00 AM EST by a video enabled telemedicine application and verified that I am speaking with the correct person using two identifiers.  Location: Patient: Home Provider: Office   I discussed the limitations of evaluation and management by telemedicine and the availability of in person appointments. The patient expressed understanding and agreed to proceed.  History of Present Illness: 86 year old woman with bronchiectasis colonized with Pseudomonas, chronic cough and chronic asthmatic bronchitis.  She has upper airway irritation and chronic upper airway cough as well.  We have been managing her on scheduled prednisone 10 mg daily, rotating antibiotics (Omnicef, azithromycin).  She uses a smart vest daily, at her last visit restarted scheduled guaifenesin.  She is using her albuterol nebs 3 times a day on a schedule to help with secretion clearance.  Repeat chest x-ray 02/09/2019 reviewed by me post recent hospitalization showed chronic bronchitic changes, no infiltrates and a large hiatal hernia.   Observations/Objective: She has had her 2nd COVID vaccine. She is feeling better than last month. Less mucous - some days none. She is doing chest vest bid. Remains on pred 10mg . Was able to wait the full month before she started her next abx. She is having better PO intake, drinking more liquids.   Assessment and Plan: BTX and asthmatic bronchitis Overall doing well - improved since last time.  - continue her rotating abx, pred 10mg  - continue scheduled albuterol nebs tid - chest vest - follow CXR in 6 months or prn for sx - rov 3 months or prn.   Follow Up Instructions: As above   I discussed the assessment and treatment plan with the patient. The patient was provided an opportunity to ask questions and all were answered. The patient agreed with the plan and demonstrated an understanding of the instructions.   The  patient was advised to call back or seek an in-person evaluation if the symptoms worsen or if the condition fails to improve as anticipated.  I provided 15 minutes of non-face-to-face time during this encounter.   , MD

## 2019-03-15 NOTE — Patient Instructions (Signed)
Continue same regimen Repeat CXR in 6 months, ROV in 3 months.

## 2019-03-16 ENCOUNTER — Other Ambulatory Visit: Payer: Self-pay | Admitting: *Deleted

## 2019-03-16 DIAGNOSIS — R2681 Unsteadiness on feet: Secondary | ICD-10-CM | POA: Diagnosis not present

## 2019-03-16 DIAGNOSIS — R262 Difficulty in walking, not elsewhere classified: Secondary | ICD-10-CM | POA: Diagnosis not present

## 2019-03-16 DIAGNOSIS — J471 Bronchiectasis with (acute) exacerbation: Secondary | ICD-10-CM | POA: Diagnosis not present

## 2019-03-16 DIAGNOSIS — M6281 Muscle weakness (generalized): Secondary | ICD-10-CM | POA: Diagnosis not present

## 2019-03-16 MED ORDER — CARVEDILOL 6.25 MG PO TABS: ORAL_TABLET | ORAL | 2 refills | Status: DC

## 2019-03-18 NOTE — Telephone Encounter (Signed)
error 

## 2019-03-22 DIAGNOSIS — Z1159 Encounter for screening for other viral diseases: Secondary | ICD-10-CM | POA: Diagnosis not present

## 2019-03-22 DIAGNOSIS — Z20828 Contact with and (suspected) exposure to other viral communicable diseases: Secondary | ICD-10-CM | POA: Diagnosis not present

## 2019-03-29 DIAGNOSIS — Z20828 Contact with and (suspected) exposure to other viral communicable diseases: Secondary | ICD-10-CM | POA: Diagnosis not present

## 2019-03-29 DIAGNOSIS — Z1159 Encounter for screening for other viral diseases: Secondary | ICD-10-CM | POA: Diagnosis not present

## 2019-04-05 DIAGNOSIS — Z1159 Encounter for screening for other viral diseases: Secondary | ICD-10-CM | POA: Diagnosis not present

## 2019-04-05 DIAGNOSIS — Z20828 Contact with and (suspected) exposure to other viral communicable diseases: Secondary | ICD-10-CM | POA: Diagnosis not present

## 2019-04-06 DIAGNOSIS — H401122 Primary open-angle glaucoma, left eye, moderate stage: Secondary | ICD-10-CM | POA: Diagnosis not present

## 2019-04-13 ENCOUNTER — Encounter: Payer: Self-pay | Admitting: Podiatry

## 2019-04-13 ENCOUNTER — Ambulatory Visit (INDEPENDENT_AMBULATORY_CARE_PROVIDER_SITE_OTHER): Payer: Medicare Other | Admitting: Podiatry

## 2019-04-13 ENCOUNTER — Other Ambulatory Visit: Payer: Self-pay

## 2019-04-13 VITALS — Temp 96.3°F

## 2019-04-13 DIAGNOSIS — L84 Corns and callosities: Secondary | ICD-10-CM | POA: Diagnosis not present

## 2019-04-13 DIAGNOSIS — S90424D Blister (nonthermal), right lesser toe(s), subsequent encounter: Secondary | ICD-10-CM | POA: Diagnosis not present

## 2019-04-13 DIAGNOSIS — B351 Tinea unguium: Secondary | ICD-10-CM

## 2019-04-13 DIAGNOSIS — Z9229 Personal history of other drug therapy: Secondary | ICD-10-CM | POA: Diagnosis not present

## 2019-04-13 DIAGNOSIS — M79675 Pain in left toe(s): Secondary | ICD-10-CM | POA: Diagnosis not present

## 2019-04-13 DIAGNOSIS — M79674 Pain in right toe(s): Secondary | ICD-10-CM

## 2019-04-13 NOTE — Patient Instructions (Addendum)
DRESSING CHANGES 3RD TOE RIGHT FOOT:    1. APPLY A LIGHT AMOUNT OF ANTIBIOTIC OINTMENT/CREAM TO RIGHT 3RD TOE ONCE DAILY UNTIL HEALED.   2. APPLY BAND-AID.  3. DO NOT WALK BAREFOOT!!!   4. IF YOU EXPERIENCE ANY FEVER, CHILLS, NIGHTSWEATS, NAUSEA OR VOMITING, ELEVATED OR LOW BLOOD SUGARS, REPORT TO EMERGENCY ROOM.  5.  IF YOU EXPERIENCE INCREASED REDNESS, PAIN, SWELLING, DISCOLORATION, ODOR, PUS, DRAINAGE OR WARMTH OF YOUR FOOT, REPORT TO EMERGENCY ROOM.   Corns and Calluses Corns are small areas of thickened skin that occur on the top, sides, or tip of a toe. They contain a cone-shaped core with a point that can press on a nerve below. This causes pain.  Calluses are areas of thickened skin that can occur anywhere on the body, including the hands, fingers, palms, soles of the feet, and heels. Calluses are usually larger than corns. What are the causes? Corns and calluses are caused by rubbing (friction) or pressure, such as from shoes that are too tight or do not fit properly. What increases the risk? Corns are more likely to develop in people who have misshapen toes (toe deformities), such as hammer toes. Calluses can occur with friction to any area of the skin. They are more likely to develop in people who:  Work with their hands.  Wear shoes that fit poorly, are too tight, or are high-heeled.  Have toe deformities. What are the signs or symptoms? Symptoms of a corn or callus include:  A hard growth on the skin.  Pain or tenderness under the skin.  Redness and swelling.  Increased discomfort while wearing tight-fitting shoes, if your feet are affected. If a corn or callus becomes infected, symptoms may include:  Redness and swelling that gets worse.  Pain.  Fluid, blood, or pus draining from the corn or callus. How is this diagnosed? Corns and calluses may be diagnosed based on your symptoms, your medical history, and a physical exam. How is this treated? Treatment  for corns and calluses may include:  Removing the cause of the friction or pressure. This may involve: ? Changing your shoes. ? Wearing shoe inserts (orthotics) or other protective layers in your shoes, such as a corn pad. ? Wearing gloves.  Applying medicine to the skin (topical medicine) to help soften skin in the hardened, thickened areas.  Removing layers of dead skin with a file to reduce the size of the corn or callus.  Removing the corn or callus with a scalpel or laser.  Taking antibiotic medicines, if your corn or callus is infected.  Having surgery, if a toe deformity is the cause. Follow these instructions at home:   Take over-the-counter and prescription medicines only as told by your health care provider.  If you were prescribed an antibiotic, take it as told by your health care provider. Do not stop taking it even if your condition starts to improve.  Wear shoes that fit well. Avoid wearing high-heeled shoes and shoes that are too tight or too loose.  Wear any padding, protective layers, gloves, or orthotics as told by your health care provider.  Soak your hands or feet and then use a file or pumice stone to soften your corn or callus. Do this as told by your health care provider.  Check your corn or callus every day for symptoms of infection. Contact a health care provider if you:  Notice that your symptoms do not improve with treatment.  Have redness or swelling that gets  worse.  Notice that your corn or callus becomes painful.  Have fluid, blood, or pus coming from your corn or callus.  Have new symptoms. Summary  Corns are small areas of thickened skin that occur on the top, sides, or tip of a toe.  Calluses are areas of thickened skin that can occur anywhere on the body, including the hands, fingers, palms, and soles of the feet. Calluses are usually larger than corns.  Corns and calluses are caused by rubbing (friction) or pressure, such as from  shoes that are too tight or do not fit properly.  Treatment may include wearing any padding, protective layers, gloves, or orthotics as told by your health care provider. This information is not intended to replace advice given to you by your health care provider. Make sure you discuss any questions you have with your health care provider. Document Revised: 04/22/2018 Document Reviewed: 11/13/2016 Elsevier Patient Education  2020 [employer]  Peripheral Neuropathy Peripheral neuropathy is a type of nerve damage. It affects nerves that carry signals between the spinal cord and the arms, legs, and the rest of the body (peripheral nerves). It does not affect nerves in the spinal cord or brain. In peripheral neuropathy, one nerve or a group of nerves may be damaged. Peripheral neuropathy is a broad category that includes many specific nerve disorders, like diabetic neuropathy, hereditary neuropathy, and carpal tunnel syndrome. What are the causes? This condition may be caused by:  Diabetes. This is the most common cause of peripheral neuropathy.  Nerve injury.  Pressure or stress on a nerve that lasts a long time.  Lack (deficiency) of B vitamins. This can result from alcoholism, poor diet, or a restricted diet.  Infections.  Autoimmune diseases, such as rheumatoid arthritis and systemic lupus erythematosus.  Nerve diseases that are passed from parent to child (inherited).  Some medicines, such as cancer medicines (chemotherapy).  Poisonous (toxic) substances, such as lead and mercury.  Too little blood flowing to the legs.  Kidney disease.  Thyroid disease. In some cases, the cause of this condition is not known. What are the signs or symptoms? Symptoms of this condition depend on which of your nerves is damaged. Common symptoms include:  Loss of feeling (numbness) in the feet, hands, or both.  Tingling in the feet, hands, or both.  Burning pain.  Very sensitive  skin.  Weakness.  Not being able to move a part of the body (paralysis).  Muscle twitching.  Clumsiness or poor coordination.  Loss of balance.  Not being able to control your bladder.  Feeling dizzy.  Sexual problems. How is this diagnosed? Diagnosing and finding the cause of peripheral neuropathy can be difficult. Your health care provider will take your medical history and do a physical exam. A neurological exam will also be done. This involves checking things that are affected by your brain, spinal cord, and nerves (nervous system). For example, your health care provider will check your reflexes, how you move, and what you can feel. You may have other tests, such as:  Blood tests.  Electromyogram (EMG) and nerve conduction tests. These tests check nerve function and how well the nerves are controlling the muscles.  Imaging tests, such as CT scans or MRI to rule out other causes of your symptoms.  Removing a small piece of nerve to be examined in a lab (nerve biopsy). This is rare.  Removing and examining a small amount of the fluid that surrounds the brain and spinal cord (  lumbar puncture). This is rare. How is this treated? Treatment for this condition may involve:  Treating the underlying cause of the neuropathy, such as diabetes, kidney disease, or vitamin deficiencies.  Stopping medicines that can cause neuropathy, such as chemotherapy.  Medicine to relieve pain. Medicines may include: ? Prescription or over-the-counter pain medicine. ? Antiseizure medicine. ? Antidepressants. ? Pain-relieving patches that are applied to painful areas of skin.  Surgery to relieve pressure on a nerve or to destroy a nerve that is causing pain.  Physical therapy to help improve movement and balance.  Devices to help you move around (assistive devices). Follow these instructions at home: Medicines  Take over-the-counter and prescription medicines only as told by your health  care provider. Do not take any other medicines without first asking your health care provider.  Do not drive or use heavy machinery while taking prescription pain medicine. Lifestyle   Do not use any products that contain nicotine or tobacco, such as cigarettes and e-cigarettes. Smoking keeps blood from reaching damaged nerves. If you need help quitting, ask your health care provider.  Avoid or limit alcohol. Too much alcohol can cause a vitamin B deficiency, and vitamin B is needed for healthy nerves.  Eat a healthy diet. This includes: ? Eating foods that are high in fiber, such as fresh fruits and vegetables, whole grains, and beans. ? Limiting foods that are high in fat and processed sugars, such as fried or sweet foods. General instructions   If you have diabetes, work closely with your health care provider to keep your blood sugar under control.  If you have numbness in your feet: ? Check every day for signs of injury or infection. Watch for redness, warmth, and swelling. ? Wear padded socks and comfortable shoes. These help protect your feet.  Develop a good support system. Living with peripheral neuropathy can be stressful. Consider talking with a mental health specialist or joining a support group.  Use assistive devices and attend physical therapy as told by your health care provider. This may include using a walker or a cane.  Keep all follow-up visits as told by your health care provider. This is important. Contact a health care provider if:  You have new signs or symptoms of peripheral neuropathy.  You are struggling emotionally from dealing with peripheral neuropathy.  Your pain is not well-controlled. Get help right away if:  You have an injury or infection that is not healing normally.  You develop new weakness in an arm or leg.  You fall frequently. Summary  Peripheral neuropathy is when the nerves in the arms, or legs are damaged, resulting in numbness,  weakness, or pain.  There are many causes of peripheral neuropathy, including diabetes, pinched nerves, vitamin deficiencies, autoimmune disease, and hereditary conditions.  Diagnosing and finding the cause of peripheral neuropathy can be difficult. Your health care provider will take your medical history, do a physical exam, and do tests, including blood tests and nerve function tests.  Treatment involves treating the underlying cause of the neuropathy and taking medicines to help control pain. Physical therapy and assistive devices may also help. This information is not intended to replace advice given to you by your health care provider. Make sure you discuss any questions you have with your health care provider. Document Revised: 12/13/2016 Document Reviewed: 03/11/2016 Elsevier Patient Education  2020 [employer].

## 2019-04-14 NOTE — Progress Notes (Signed)
Subjective: Sarah [name] presents today for follow up of corn(s) b/l feet and painful mycotic toenails b/l that are difficult to trim. Pain interferes with ambulation. Aggravating factors include wearing enclosed shoe gear. Pain is relieved with periodic professional debridement.   Her caregiver accompanies her on today's visit.   Allergies  Allergen Reactions  . Codeine Anaphylaxis  . Hydrocodone Nausea And Vomiting and Other (See Comments)    SYNCOPE AND BRADYCARDIA  . Isosorbide Nitrate Other (See Comments)    BRADYCARDIA  . Oxycodone Palpitations    Rapid heart beat  . Clarithromycin Nausea And Vomiting    Has patient had a PCN reaction causing immediate rash, facial/tongue/throat swelling, SOB or lightheadedness with hypotension: Unknown Has patient had a PCN reaction causing severe rash involving mucus membranes or skin necrosis: Unknown Has patient had a PCN reaction that required hospitalization: Unknown Has patient had a PCN reaction occurring within the last 10 years: Unknown If all of the above answers are "NO", then may proceed with Cephalosporin use.   . Fiorinal [Butalbital-Aspirin-Caffeine] Swelling and Other (See Comments)    Facial swelling   . Levofloxacin Nausea And Vomiting and Other (See Comments)    SYNCOPE ALSO  . Augmentin [Amoxicillin-Pot Clavulanate] Diarrhea  . Doxycycline     Sweats and aches     Objective: Vitals:   04/13/19 1405  Temp: (!) 96.3 F (35.7 C)    Pt 86 y.o. year old female  in NAD. AAO x 3.   Vascular Examination:  Capillary fill time to digits <3 seconds b/l. Palpable DP pulses b/l. Palpable PT pulses b/l. Pedal hair absent b/l Skin temperature gradient within normal limits b/l.  Dermatological Examination: Pedal skin is thin shiny, atrophic bilaterally. No interdigital macerations bilaterally. Toenails 1-5 b/l elongated, dystrophic, thickened, crumbly with subungual debris and tenderness to dorsal palpation. Hyperkeratotic  lesion(s) L 4th toe and R 5th toe.  No erythema, no edema, no drainage, no flocculence.   Right 3rd digit with hyperkeratotic lesion with subdermal serous drainage revealing superficial ulceration distal tip of digit. Predebridement measurements carried out today of 0.5 cm in diameter.  No redness, streaking or lymphangitis noted.  No probing to bone, no undermining, no active pus or purulence noted.  No deep abscess, no erythema, no edema, no cellulitis, no odor was encountered.    Postdebridement measurements today are: 0.5 cm in diameter.  No redness, streaking or lymphangitis noted.  No probing to bone, no undermining, no active pus or purulence noted.  No deep abscess, no erythema, no edema, no cellulitis, no odor was encountered.  Musculoskeletal: Normal muscle strength 5/5 to all lower extremity muscle groups bilaterally, no pain crepitus or joint limitation noted with ROM b/l and hammertoes noted to the  2-5 bilaterally.  Neurological: Protective sensation intact 5/5 intact bilaterally with 10g monofilament b/l  Assessment: 1. Pain due to onychomycosis of toenails of both feet   2. Corns   3. Blister of toe of right foot, subsequent encounter   4. Hx of long term use of blood thinners    Plan: -Toenails 1-5 b/l were debrided in length and girth with sterile nail nippers and dremel without iatrogenic bleeding.  -Corn(s) debrided L 4th toe and R 5th toe without complication or incident. Total number debrided=2. -Right 3rd digit blister evacuated. Lumicain hemostatic solution applied. Triple antibiotic ointment applied. Written orders dispensed to apply antibiotic ointment to digit once daily until healed. Call if any problems arise.  -Patient to continue soft, supportive shoe  gear daily. -Patient to report any pedal injuries to medical professional immediately. -Patient/POA to call should there be question/concern in the interim.  Return in about 9 weeks (around 06/15/2019) for nail and  callus trim.

## 2019-04-19 DIAGNOSIS — Z1159 Encounter for screening for other viral diseases: Secondary | ICD-10-CM | POA: Diagnosis not present

## 2019-04-19 DIAGNOSIS — Z20828 Contact with and (suspected) exposure to other viral communicable diseases: Secondary | ICD-10-CM | POA: Diagnosis not present

## 2019-04-26 DIAGNOSIS — Z20828 Contact with and (suspected) exposure to other viral communicable diseases: Secondary | ICD-10-CM | POA: Diagnosis not present

## 2019-04-26 DIAGNOSIS — Z1159 Encounter for screening for other viral diseases: Secondary | ICD-10-CM | POA: Diagnosis not present

## 2019-05-03 DIAGNOSIS — Z1159 Encounter for screening for other viral diseases: Secondary | ICD-10-CM | POA: Diagnosis not present

## 2019-05-03 DIAGNOSIS — Z20828 Contact with and (suspected) exposure to other viral communicable diseases: Secondary | ICD-10-CM | POA: Diagnosis not present

## 2019-05-10 DIAGNOSIS — Z20828 Contact with and (suspected) exposure to other viral communicable diseases: Secondary | ICD-10-CM | POA: Diagnosis not present

## 2019-05-10 DIAGNOSIS — Z1159 Encounter for screening for other viral diseases: Secondary | ICD-10-CM | POA: Diagnosis not present

## 2019-05-14 ENCOUNTER — Other Ambulatory Visit: Payer: Self-pay

## 2019-05-14 MED ORDER — PREDNISONE 10 MG PO TABS: ORAL_TABLET | ORAL | 5 refills | Status: DC

## 2019-05-17 DIAGNOSIS — Z20828 Contact with and (suspected) exposure to other viral communicable diseases: Secondary | ICD-10-CM | POA: Diagnosis not present

## 2019-05-17 DIAGNOSIS — Z1159 Encounter for screening for other viral diseases: Secondary | ICD-10-CM | POA: Diagnosis not present

## 2019-05-17 NOTE — Telephone Encounter (Signed)
Called daughter.  Appointment scheduled  Nothing further needed at this time.

## 2019-05-18 ENCOUNTER — Other Ambulatory Visit: Payer: Self-pay | Admitting: *Deleted

## 2019-05-21 ENCOUNTER — Telehealth: Payer: Self-pay | Admitting: Cardiovascular Disease

## 2019-05-21 NOTE — Telephone Encounter (Signed)
Called as spoke with Sharyl Nimrod who reports pt's BP has been elevated.  Seems to be higher in the afternoons.  This has been going on all week.  Pt has not been taking the prn dose of Amlodipine 5 mg daily.  Advised [name] to check BP approximately 1 hour after taking AM meds.  If BP remains elevated to give the prn dose of Amlodipine and that pt can have a total of 10 mg a day as needed.  She states understanding and will continue to monitor BP and give prn dose as needed.

## 2019-05-21 NOTE — Telephone Encounter (Signed)
Pt c/o BP issue: STAT if pt c/o blurred vision, one-sided weakness or slurred speech  1. What are your last 5 BP readings?  173/97  200/130  2. Are you having any other symptoms (ex. Dizziness, headache, blurred vision, passed out)? Patient's daughter states the patient has experienced blurred vision, however it may be due to glaucoma.  3. What is your BP issue? Patient's daughter is calling in regards to elevated BP. She states BP has been high for approximately 1 week and she is requesting to adjust her carvedilol (COREG) 6.25 MG tablet medication.

## 2019-05-24 DIAGNOSIS — Z1159 Encounter for screening for other viral diseases: Secondary | ICD-10-CM | POA: Diagnosis not present

## 2019-05-24 DIAGNOSIS — Z20828 Contact with and (suspected) exposure to other viral communicable diseases: Secondary | ICD-10-CM | POA: Diagnosis not present

## 2019-05-26 ENCOUNTER — Other Ambulatory Visit: Payer: Medicare Other

## 2019-05-26 ENCOUNTER — Other Ambulatory Visit: Payer: Self-pay

## 2019-05-26 DIAGNOSIS — Z515 Encounter for palliative care: Secondary | ICD-10-CM

## 2019-05-31 DIAGNOSIS — Z20828 Contact with and (suspected) exposure to other viral communicable diseases: Secondary | ICD-10-CM | POA: Diagnosis not present

## 2019-05-31 DIAGNOSIS — Z1159 Encounter for screening for other viral diseases: Secondary | ICD-10-CM | POA: Diagnosis not present

## 2019-05-31 NOTE — Progress Notes (Signed)
COMMUNITY PALLIATIVE CARE SW NOTE  PATIENT NAME: Sarah [name] DOB: 1921/12/31 MRN: [id number]  PRIMARY CARE PROVIDER: [name], MD  RESPONSIBLE PARTY:  Acct ID - Guarantor Home Phone Work Phone Relationship Acct Type  [id number] - Sarah [name]* [phone]  Self P/F     [address], [name], [place] [address]     PLAN OF CARE and INTERVENTIONS:             1. GOALS OF CARE/ ADVANCE CARE PLANNING: Goal is for patient to remain in her apartment ([place]) and as independent as possible. Patient is a DNR. 2. SOCIAL/EMOTIONAL/SPIRITUAL ASSESSMENT/ INTERVENTIONS:  SW completed face-to-face visit with patient. She was sat and talked with SW. She denied pain. SW provided introduction to her as new Child psychotherapist. Patient provided a social history through life review and status update. Patient report that her appetite remains fair. Patient report that she has not been up walking out in the halls as much as she use to due to concerns about her balance increased fatigued. She is thin in appearance. She has been fully vaccinated for COVID. Patient continues to go down to the dinning room for meals and socialize. Her family remains supportive. SW provided supportive presence, active and reflective listing, assessed needs, coping, comfort and safety, observation and life review. 3. PATIENT/CAREGIVER EDUCATION/ COPING: Patient expressed no coping issues or distress. She has a strong supportive network with friends and family.  SW briefly spoke with patient's daughter when scheduling visit and no coping issues or concerns were noted.   4. PERSONAL EMERGENCY PLAN: Patient has an emergency pendant. Patient's primary care physician can be contacted by PC[facility] for any concerns. SW reinforced access to palliative care support when needed. 911 can be accessed for emergencies.  5. COMMUNITY RESOURCES COORDINATION/ HEALTH CARE NAVIGATION: Patient has assistance through [facility] nursing staff to assist with  personal care needs and medication reminder. 6. FINANCIAL/LEGAL CONCERNS/INTERVENTIONS:  No financial or legal issues noted.      SOCIAL HX:  Social History   Tobacco Use  . Smoking status: Never Smoker  . Smokeless tobacco: Never Used  Substance Use Topics  . Alcohol use: Yes    Alcohol/week: 2.0 standard drinks    Types: 2 Glasses of wine per week    Comment: wine 1-2 week    CODE STATUS:   Code Status: Prior DNR ADVANCED DIRECTIVES: N MOST FORM COMPLETE: No HOSPICE EDUCATION PROVIDED: No  PPS: Patient is alert and oriented x3, but admits that she is forgetful. She ambulates independently in her apartment, but requires standby assistance with ADL's.   Duration of visit and documentation:60 minutes.      [address] [place], [place]

## 2019-06-07 DIAGNOSIS — Z1159 Encounter for screening for other viral diseases: Secondary | ICD-10-CM | POA: Diagnosis not present

## 2019-06-07 DIAGNOSIS — Z20828 Contact with and (suspected) exposure to other viral communicable diseases: Secondary | ICD-10-CM | POA: Diagnosis not present

## 2019-06-10 ENCOUNTER — Other Ambulatory Visit: Payer: Self-pay

## 2019-06-10 ENCOUNTER — Encounter: Payer: Self-pay | Admitting: Emergency Medicine

## 2019-06-10 ENCOUNTER — Ambulatory Visit (INDEPENDENT_AMBULATORY_CARE_PROVIDER_SITE_OTHER): Payer: Medicare Other | Admitting: Emergency Medicine

## 2019-06-10 DIAGNOSIS — J479 Bronchiectasis, uncomplicated: Secondary | ICD-10-CM

## 2019-06-10 DIAGNOSIS — J4521 Mild intermittent asthma with (acute) exacerbation: Secondary | ICD-10-CM

## 2019-06-10 DIAGNOSIS — R059 Cough, unspecified: Secondary | ICD-10-CM

## 2019-06-10 DIAGNOSIS — R05 Cough: Secondary | ICD-10-CM | POA: Diagnosis not present

## 2019-06-10 NOTE — Assessment & Plan Note (Signed)
Scheduled albuterol nebs

## 2019-06-10 NOTE — Progress Notes (Signed)
   Subjective:    Patient ID: Sarah [name], female    DOB: 11/24/1921, 86 y.o.   MRN: [id number]  HPI  ROV 06/10/19 --follow-up visit for Sarah [name], 86 year old woman with chronic cough in the setting of bronchiectasis (Pseudomonas colonized), chronic asthmatic bronchitis.  She also has superimposed upper airway disease which contributes to daily cough.  I have managed her on prednisone 10 mg daily, now also rotating antibiotics with azithromycin and Omnicef.  She also uses smart vest 1-2x a day, scheduled albuterol 3 times a day.  She reports that she continues to slowly feel better. Better exertional tolerance. No fever. No flares since last time. COVID 19 vaccine up to date. Remains on protonix bid.     Review of Systems As above      Objective:   Physical Exam  Vitals:   06/10/19 1636  BP: 128/68  Pulse: 80  Temp: 97.7 F (36.5 C)  TempSrc: Oral  SpO2: 94%  Weight: 122 lb 12.8 oz (55.7 kg)  Height: 5\' 6"  (1.676 m)    GEN: A/Ox3, a bit anxious, NAD, elderly and thin. Stronger today   HEENT:  OP clear  NECK: no stridor  RESP distant, B insp rhonchi, crackles, no wheezing  CARD:  Regular, no M  Musco: some chronic venous stasis, no edema.   Neuro: awake, poor hearing, oriented       Assessment & Plan:  BRONCHIECTASIS Overall doing well on her current regimen.  Slowly regaining her strength after being significantly debilitated by HCAP several months ago.  Plan to continue.  COVID-19 vaccine is up-to-date  Please continue prednisone 10 mg once daily. Please continue your rotating antibiotics (azithromycin, Omnicef) for the first week of each month Continue smart vest treatments 1-2 times a day every day. Continue your albuterol nebulizer 3 times a day. COVID-19 vaccine is up-to-date Continue Protonix twice a day as you have been taking it. We will discuss the timing of any repeat imaging, chest x-ray at your next visit. Follow with Dr in 4 months or  sooner if you have any problems.  Asthma Scheduled albuterol nebs  Cough Principally due to her bronchiectasis and secretion burden.  She does have some upper airway disease, on Protonix for GERD contribution.  Delton Coombes, MD, PhD 06/10/2019, 5:08 PM [facility] [phone] or if no answer [phone]

## 2019-06-10 NOTE — Assessment & Plan Note (Signed)
Overall doing well on her current regimen.  Slowly regaining her strength after being significantly debilitated by HCAP several months ago.  Plan to continue.  COVID-19 vaccine is up-to-date  Please continue prednisone 10 mg once daily. Please continue your rotating antibiotics (azithromycin, Omnicef) for the first week of each month Continue smart vest treatments 1-2 times a day every day. Continue your albuterol nebulizer 3 times a day. COVID-19 vaccine is up-to-date Continue Protonix twice a day as you have been taking it. We will discuss the timing of any repeat imaging, chest x-ray at your next visit. Follow with Dr Delton Coombes in 4 months or sooner if you have any problems.

## 2019-06-10 NOTE — Patient Instructions (Addendum)
Please continue prednisone 10 mg once daily. Please continue your rotating antibiotics (azithromycin, Omnicef) for the first week of each month Continue smart vest treatments 1-2 times a day every day. Continue your albuterol nebulizer 3 times a day. COVID-19 vaccine is up-to-date Continue Protonix twice a day as you have been taking it. We will discuss the timing of any repeat imaging, chest x-ray at your next visit. Follow with Dr Delton Coombes in 4 months or sooner if you have any problems.

## 2019-06-10 NOTE — Assessment & Plan Note (Signed)
Principally due to her bronchiectasis and secretion burden.  She does have some upper airway disease, on Protonix for GERD contribution.

## 2019-06-14 DIAGNOSIS — Z1159 Encounter for screening for other viral diseases: Secondary | ICD-10-CM | POA: Diagnosis not present

## 2019-06-14 DIAGNOSIS — Z20828 Contact with and (suspected) exposure to other viral communicable diseases: Secondary | ICD-10-CM | POA: Diagnosis not present

## 2019-06-21 DIAGNOSIS — Z1159 Encounter for screening for other viral diseases: Secondary | ICD-10-CM | POA: Diagnosis not present

## 2019-06-21 DIAGNOSIS — Z20828 Contact with and (suspected) exposure to other viral communicable diseases: Secondary | ICD-10-CM | POA: Diagnosis not present

## 2019-06-29 ENCOUNTER — Encounter: Payer: Self-pay | Admitting: Podiatry

## 2019-06-29 ENCOUNTER — Other Ambulatory Visit: Payer: Self-pay | Admitting: Emergency Medicine

## 2019-06-29 ENCOUNTER — Telehealth: Payer: Self-pay | Admitting: Emergency Medicine

## 2019-06-29 ENCOUNTER — Other Ambulatory Visit: Payer: Self-pay

## 2019-06-29 ENCOUNTER — Ambulatory Visit (INDEPENDENT_AMBULATORY_CARE_PROVIDER_SITE_OTHER): Payer: Medicare Other | Admitting: Podiatry

## 2019-06-29 DIAGNOSIS — M79675 Pain in left toe(s): Secondary | ICD-10-CM | POA: Diagnosis not present

## 2019-06-29 DIAGNOSIS — B351 Tinea unguium: Secondary | ICD-10-CM

## 2019-06-29 DIAGNOSIS — M79674 Pain in right toe(s): Secondary | ICD-10-CM

## 2019-06-29 DIAGNOSIS — L84 Corns and callosities: Secondary | ICD-10-CM

## 2019-06-29 DIAGNOSIS — M792 Neuralgia and neuritis, unspecified: Secondary | ICD-10-CM

## 2019-06-29 MED ORDER — AZITHROMYCIN 250 MG PO TABS: 250.0000 mg | ORAL_TABLET | Freq: Every day | ORAL | 2 refills | Status: DC

## 2019-06-29 NOTE — Telephone Encounter (Signed)
Spoke with pt's daughter, Sharyl Nimrod. Pt is needing a refill on Azithromycin. This is a maintenance medication for her. Rx has been sent in. Nothing further was needed.

## 2019-07-01 ENCOUNTER — Telehealth: Payer: Self-pay | Admitting: *Deleted

## 2019-07-01 MED ORDER — NONFORMULARY OR COMPOUNDED ITEM: 5 refills | Status: DC

## 2019-07-01 NOTE — Telephone Encounter (Signed)
Pt's dtr, Johnny Bridge states she and her sister have discuss the neuropathy cream and fill it is a good idea. Faxed orders to [facility] for Neuropathy Cream with Johnny Bridge as the contact person.

## 2019-07-02 ENCOUNTER — Ambulatory Visit (INDEPENDENT_AMBULATORY_CARE_PROVIDER_SITE_OTHER)
Admission: EM | Admit: 2019-07-02 | Discharge: 2019-07-02 | Disposition: A | Discharge status: Home / Self Care | Payer: Medicare Other | Source: Home / Self Care

## 2019-07-02 ENCOUNTER — Encounter (HOSPITAL_COMMUNITY): Payer: Self-pay

## 2019-07-02 ENCOUNTER — Telehealth: Payer: Self-pay | Admitting: Emergency Medicine

## 2019-07-02 ENCOUNTER — Inpatient Hospital Stay (HOSPITAL_COMMUNITY)
Admission: EM | Admit: 2019-07-02 | Discharge: 2019-07-09 | DRG: 189 | Disposition: A | Discharge status: Home Health | Payer: Medicare Other | Attending: Internal Medicine | Admitting: Internal Medicine

## 2019-07-02 ENCOUNTER — Emergency Department (HOSPITAL_COMMUNITY): Payer: Medicare Other

## 2019-07-02 ENCOUNTER — Inpatient Hospital Stay (HOSPITAL_COMMUNITY): Payer: Medicare Other

## 2019-07-02 ENCOUNTER — Other Ambulatory Visit: Payer: Self-pay

## 2019-07-02 ENCOUNTER — Encounter: Payer: Self-pay | Admitting: Emergency Medicine

## 2019-07-02 ENCOUNTER — Encounter: Payer: Self-pay | Admitting: Podiatry

## 2019-07-02 DIAGNOSIS — M19042 Primary osteoarthritis, left hand: Secondary | ICD-10-CM | POA: Diagnosis present

## 2019-07-02 DIAGNOSIS — J470 Bronchiectasis with acute lower respiratory infection: Secondary | ICD-10-CM | POA: Diagnosis not present

## 2019-07-02 DIAGNOSIS — Z7902 Long term (current) use of antithrombotics/antiplatelets: Secondary | ICD-10-CM | POA: Diagnosis not present

## 2019-07-02 DIAGNOSIS — R0902 Hypoxemia: Secondary | ICD-10-CM

## 2019-07-02 DIAGNOSIS — J690 Pneumonitis due to inhalation of food and vomit: Secondary | ICD-10-CM | POA: Diagnosis present

## 2019-07-02 DIAGNOSIS — Z792 Long term (current) use of antibiotics: Secondary | ICD-10-CM

## 2019-07-02 DIAGNOSIS — E785 Hyperlipidemia, unspecified: Secondary | ICD-10-CM | POA: Diagnosis present

## 2019-07-02 DIAGNOSIS — Z8249 Family history of ischemic heart disease and other diseases of the circulatory system: Secondary | ICD-10-CM

## 2019-07-02 DIAGNOSIS — R0682 Tachypnea, not elsewhere classified: Secondary | ICD-10-CM

## 2019-07-02 DIAGNOSIS — Z79890 Hormone replacement therapy (postmenopausal): Secondary | ICD-10-CM

## 2019-07-02 DIAGNOSIS — I10 Essential (primary) hypertension: Secondary | ICD-10-CM | POA: Diagnosis not present

## 2019-07-02 DIAGNOSIS — Z881 Allergy status to other antibiotic agents status: Secondary | ICD-10-CM

## 2019-07-02 DIAGNOSIS — I5032 Chronic diastolic (congestive) heart failure: Secondary | ICD-10-CM | POA: Diagnosis present

## 2019-07-02 DIAGNOSIS — K219 Gastro-esophageal reflux disease without esophagitis: Secondary | ICD-10-CM | POA: Diagnosis present

## 2019-07-02 DIAGNOSIS — Z8709 Personal history of other diseases of the respiratory system: Secondary | ICD-10-CM

## 2019-07-02 DIAGNOSIS — Z885 Allergy status to narcotic agent status: Secondary | ICD-10-CM

## 2019-07-02 DIAGNOSIS — Z8673 Personal history of transient ischemic attack (TIA), and cerebral infarction without residual deficits: Secondary | ICD-10-CM | POA: Diagnosis not present

## 2019-07-02 DIAGNOSIS — J45909 Unspecified asthma, uncomplicated: Secondary | ICD-10-CM | POA: Diagnosis present

## 2019-07-02 DIAGNOSIS — R609 Edema, unspecified: Secondary | ICD-10-CM

## 2019-07-02 DIAGNOSIS — M8580 Other specified disorders of bone density and structure, unspecified site: Secondary | ICD-10-CM | POA: Diagnosis present

## 2019-07-02 DIAGNOSIS — I2510 Atherosclerotic heart disease of native coronary artery without angina pectoris: Secondary | ICD-10-CM | POA: Diagnosis not present

## 2019-07-02 DIAGNOSIS — I252 Old myocardial infarction: Secondary | ICD-10-CM | POA: Diagnosis not present

## 2019-07-02 DIAGNOSIS — M47819 Spondylosis without myelopathy or radiculopathy, site unspecified: Secondary | ICD-10-CM | POA: Diagnosis present

## 2019-07-02 DIAGNOSIS — E039 Hypothyroidism, unspecified: Secondary | ICD-10-CM | POA: Diagnosis present

## 2019-07-02 DIAGNOSIS — Z823 Family history of stroke: Secondary | ICD-10-CM

## 2019-07-02 DIAGNOSIS — H409 Unspecified glaucoma: Secondary | ICD-10-CM

## 2019-07-02 DIAGNOSIS — Z20822 Contact with and (suspected) exposure to covid-19: Secondary | ICD-10-CM | POA: Diagnosis present

## 2019-07-02 DIAGNOSIS — G609 Hereditary and idiopathic neuropathy, unspecified: Secondary | ICD-10-CM | POA: Diagnosis present

## 2019-07-02 DIAGNOSIS — Z66 Do not resuscitate: Secondary | ICD-10-CM | POA: Diagnosis present

## 2019-07-02 DIAGNOSIS — Z7952 Long term (current) use of systemic steroids: Secondary | ICD-10-CM | POA: Diagnosis not present

## 2019-07-02 DIAGNOSIS — J479 Bronchiectasis, uncomplicated: Secondary | ICD-10-CM | POA: Diagnosis not present

## 2019-07-02 DIAGNOSIS — I110 Hypertensive heart disease with heart failure: Secondary | ICD-10-CM | POA: Diagnosis present

## 2019-07-02 DIAGNOSIS — M19041 Primary osteoarthritis, right hand: Secondary | ICD-10-CM | POA: Diagnosis present

## 2019-07-02 DIAGNOSIS — Z888 Allergy status to other drugs, medicaments and biological substances status: Secondary | ICD-10-CM

## 2019-07-02 DIAGNOSIS — J9601 Acute respiratory failure with hypoxia: Secondary | ICD-10-CM | POA: Diagnosis present

## 2019-07-02 DIAGNOSIS — I2583 Coronary atherosclerosis due to lipid rich plaque: Secondary | ICD-10-CM

## 2019-07-02 DIAGNOSIS — R000 Tachycardia, unspecified: Secondary | ICD-10-CM

## 2019-07-02 DIAGNOSIS — Z79899 Other long term (current) drug therapy: Secondary | ICD-10-CM

## 2019-07-02 DIAGNOSIS — F419 Anxiety disorder, unspecified: Secondary | ICD-10-CM | POA: Diagnosis present

## 2019-07-02 DIAGNOSIS — R0602 Shortness of breath: Secondary | ICD-10-CM

## 2019-07-02 DIAGNOSIS — J471 Bronchiectasis with (acute) exacerbation: Secondary | ICD-10-CM | POA: Diagnosis present

## 2019-07-02 DIAGNOSIS — K449 Diaphragmatic hernia without obstruction or gangrene: Secondary | ICD-10-CM | POA: Diagnosis present

## 2019-07-02 DIAGNOSIS — E7800 Pure hypercholesterolemia, unspecified: Secondary | ICD-10-CM | POA: Diagnosis present

## 2019-07-02 DIAGNOSIS — Z8042 Family history of malignant neoplasm of prostate: Secondary | ICD-10-CM

## 2019-07-02 LAB — CBC WITH DIFFERENTIAL/PLATELET
Abs Immature Granulocytes: 0.07 10*3/uL (ref 0.00–0.07)
Basophils Absolute: 0 10*3/uL (ref 0.0–0.1)
Basophils Relative: 0 %
Eosinophils Absolute: 0 10*3/uL (ref 0.0–0.5)
Eosinophils Relative: 0 %
HCT: 44.8 % (ref 36.0–46.0)
Hemoglobin: 14 g/dL (ref 12.0–15.0)
Immature Granulocytes: 0 %
Lymphocytes Relative: 6 %
Lymphs Abs: 1 10*3/uL (ref 0.7–4.0)
MCH: 32.5 pg (ref 26.0–34.0)
MCHC: 31.3 g/dL (ref 30.0–36.0)
MCV: 103.9 fL — ABNORMAL HIGH (ref 80.0–100.0)
Monocytes Absolute: 1.3 10*3/uL — ABNORMAL HIGH (ref 0.1–1.0)
Monocytes Relative: 8 %
Neutro Abs: 13.3 10*3/uL — ABNORMAL HIGH (ref 1.7–7.7)
Neutrophils Relative %: 86 %
Platelets: 247 10*3/uL (ref 150–400)
RBC: 4.31 MIL/uL (ref 3.87–5.11)
RDW: 12.4 % (ref 11.5–15.5)
WBC: 15.8 10*3/uL — ABNORMAL HIGH (ref 4.0–10.5)
nRBC: 0 % (ref 0.0–0.2)

## 2019-07-02 LAB — BASIC METABOLIC PANEL
Anion gap: 11 (ref 5–15)
BUN: 14 mg/dL (ref 8–23)
CO2: 30 mmol/L (ref 22–32)
Calcium: 8.7 mg/dL — ABNORMAL LOW (ref 8.9–10.3)
Chloride: 96 mmol/L — ABNORMAL LOW (ref 98–111)
Creatinine, Ser: 0.79 mg/dL (ref 0.44–1.00)
GFR calc Af Amer: >60 mL/min (ref >60)
GFR calc non Af Amer: >60 mL/min (ref >60)
Glucose, Bld: 89 mg/dL (ref 70–99)
Potassium: 3.7 mmol/L (ref 3.5–5.1)
Sodium: 137 mmol/L (ref 135–145)

## 2019-07-02 LAB — BRAIN NATRIURETIC PEPTIDE: B Natriuretic Peptide: 183.9 pg/mL — ABNORMAL HIGH (ref 0.0–100.0)

## 2019-07-02 LAB — SARS CORONAVIRUS 2 BY RT PCR (HOSPITAL ORDER, PERFORMED IN ~~LOC~~ HOSPITAL LAB): SARS Coronavirus 2: NEGATIVE

## 2019-07-02 LAB — LACTIC ACID, PLASMA: Lactic Acid, Venous: 1.1 mmol/L (ref 0.5–1.9)

## 2019-07-02 LAB — TROPONIN I (HIGH SENSITIVITY): Troponin I (High Sensitivity): 11 ng/L (ref <18)

## 2019-07-02 MED ORDER — SODIUM CHLORIDE (PF) 0.9 % IJ SOLN
INTRAMUSCULAR | Status: AC
  Filled 2019-07-02: qty 50

## 2019-07-02 MED ORDER — PANTOPRAZOLE SODIUM 40 MG PO TBEC
40.0000 mg | DELAYED_RELEASE_TABLET | Freq: Two times a day (BID) | ORAL | Status: DC
  Administered 2019-07-02 – 2019-07-09 (×14): 40 mg via ORAL
  Filled 2019-07-02 (×14): qty 1

## 2019-07-02 MED ORDER — DULOXETINE HCL 20 MG PO CPEP
20.0000 mg | ORAL_CAPSULE | Freq: Once | ORAL | Status: AC
  Administered 2019-07-02: 20 mg via ORAL
  Filled 2019-07-02: qty 1

## 2019-07-02 MED ORDER — ALBUTEROL SULFATE (2.5 MG/3ML) 0.083% IN NEBU
2.5000 mg | INHALATION_SOLUTION | Freq: Four times a day (QID) | RESPIRATORY_TRACT | Status: DC
  Administered 2019-07-02 – 2019-07-03 (×3): 2.5 mg via RESPIRATORY_TRACT
  Filled 2019-07-02 (×3): qty 3

## 2019-07-02 MED ORDER — METHYLPREDNISOLONE SODIUM SUCC 40 MG IJ SOLR
40.0000 mg | Freq: Every day | INTRAMUSCULAR | Status: DC
  Administered 2019-07-03: 40 mg via INTRAVENOUS
  Filled 2019-07-02: qty 1

## 2019-07-02 MED ORDER — ALBUTEROL SULFATE HFA 108 (90 BASE) MCG/ACT IN AERS
4.0000 | INHALATION_SPRAY | RESPIRATORY_TRACT | Status: DC | PRN
  Administered 2019-07-02: 4 via RESPIRATORY_TRACT
  Filled 2019-07-02: qty 6.7

## 2019-07-02 MED ORDER — ENOXAPARIN SODIUM 40 MG/0.4ML ~~LOC~~ SOLN
40.0000 mg | Freq: Every day | SUBCUTANEOUS | Status: DC
  Administered 2019-07-02 – 2019-07-08 (×7): 40 mg via SUBCUTANEOUS
  Filled 2019-07-02 (×7): qty 0.4

## 2019-07-02 MED ORDER — SODIUM CHLORIDE 0.9 % IV SOLN
1.0000 g | Freq: Once | INTRAVENOUS | Status: AC
  Administered 2019-07-02: 1 g via INTRAVENOUS
  Filled 2019-07-02: qty 10

## 2019-07-02 MED ORDER — SODIUM CHLORIDE 0.9 % IV SOLN
500.0000 mg | Freq: Once | INTRAVENOUS | Status: AC
  Administered 2019-07-02: 500 mg via INTRAVENOUS
  Filled 2019-07-02: qty 500

## 2019-07-02 MED ORDER — IOHEXOL 350 MG/ML SOLN
100.0000 mL | Freq: Once | INTRAVENOUS | Status: AC | PRN
  Administered 2019-07-02: 100 mL via INTRAVENOUS

## 2019-07-02 MED ORDER — ESCITALOPRAM OXALATE 10 MG PO TABS
5.0000 mg | ORAL_TABLET | Freq: Every day | ORAL | Status: DC
  Administered 2019-07-02 – 2019-07-09 (×8): 5 mg via ORAL
  Filled 2019-07-02 (×8): qty 1

## 2019-07-02 MED ORDER — HYDRALAZINE HCL 20 MG/ML IJ SOLN
5.0000 mg | Freq: Four times a day (QID) | INTRAMUSCULAR | Status: DC | PRN
  Administered 2019-07-05: 5 mg via INTRAVENOUS
  Filled 2019-07-02: qty 1

## 2019-07-02 MED ORDER — LEVOTHYROXINE SODIUM 50 MCG PO TABS
50.0000 ug | ORAL_TABLET | Freq: Every day | ORAL | Status: DC
  Administered 2019-07-03 – 2019-07-09 (×7): 50 ug via ORAL
  Filled 2019-07-02 (×7): qty 1

## 2019-07-02 MED ORDER — ACETAMINOPHEN 500 MG PO TABS
500.0000 mg | ORAL_TABLET | Freq: Four times a day (QID) | ORAL | Status: DC | PRN
  Administered 2019-07-02 – 2019-07-08 (×11): 500 mg via ORAL
  Filled 2019-07-02 (×14): qty 1

## 2019-07-02 MED ORDER — CARVEDILOL 3.125 MG PO TABS
6.2500 mg | ORAL_TABLET | Freq: Once | ORAL | Status: AC
  Administered 2019-07-02: 6.25 mg via ORAL
  Filled 2019-07-02: qty 2

## 2019-07-02 MED ORDER — NETARSUDIL-LATANOPROST 0.02-0.005 % OP SOLN
1.0000 [drp] | Freq: Every day | OPHTHALMIC | Status: DC
  Administered 2019-07-03 – 2019-07-08 (×6): 1 [drp] via OPHTHALMIC

## 2019-07-02 MED ORDER — SENNA 8.6 MG PO TABS
1.0000 | ORAL_TABLET | Freq: Every day | ORAL | Status: DC | PRN
  Administered 2019-07-02 – 2019-07-08 (×2): 8.6 mg via ORAL
  Filled 2019-07-02 (×2): qty 1

## 2019-07-02 MED ORDER — CLOPIDOGREL BISULFATE 75 MG PO TABS
75.0000 mg | ORAL_TABLET | Freq: Every day | ORAL | Status: DC
  Administered 2019-07-03 – 2019-07-09 (×7): 75 mg via ORAL
  Filled 2019-07-02 (×7): qty 1

## 2019-07-02 MED ORDER — LORATADINE 10 MG PO TABS
10.0000 mg | ORAL_TABLET | Freq: Every day | ORAL | Status: DC
  Administered 2019-07-03 – 2019-07-09 (×7): 10 mg via ORAL
  Filled 2019-07-02 (×7): qty 1

## 2019-07-02 MED ORDER — ALBUTEROL SULFATE (2.5 MG/3ML) 0.083% IN NEBU: 2.5000 mg | INHALATION_SOLUTION | Freq: Four times a day (QID) | RESPIRATORY_TRACT | Status: DC | PRN

## 2019-07-02 MED ORDER — ALPRAZOLAM 0.5 MG PO TABS
0.5000 mg | ORAL_TABLET | Freq: Two times a day (BID) | ORAL | Status: DC
  Administered 2019-07-02 – 2019-07-09 (×14): 0.5 mg via ORAL
  Filled 2019-07-02 (×14): qty 1

## 2019-07-02 MED ORDER — ORAL CARE MOUTH RINSE
15.0000 mL | Freq: Two times a day (BID) | OROMUCOSAL | Status: DC
  Administered 2019-07-03 – 2019-07-09 (×8): 15 mL via OROMUCOSAL

## 2019-07-02 MED ORDER — BRINZOLAMIDE-BRIMONIDINE 1-0.2 % OP SUSP
1.0000 [drp] | Freq: Three times a day (TID) | OPHTHALMIC | Status: DC
  Administered 2019-07-03 – 2019-07-06 (×10): 1 [drp] via OPHTHALMIC

## 2019-07-02 NOTE — Telephone Encounter (Signed)
Spoke with patient and daughter, patient has not been feeling well since 3-4 pm on yesterday.  Her oxygen level is 88-90% on RA, she does not wear oxygen.  She is congested in her chest and feels achy in her chest.  Coughing up dark green mucus.  Had a temperature of barely 100 last night.  She has a Z-pack that she started today, she started it earlier than usual.  Please advise on how patient should follow up.

## 2019-07-02 NOTE — ED Notes (Signed)
Called non-emergent transport for pt to ED.

## 2019-07-02 NOTE — H&P (Addendum)
History and Physical    Sarah [name] GDJ:[id number] DOB: 09-30-1921 DOA: 07/02/2019  PCP: [name], MD  Patient coming from: Home, accompanied by daughter who is in RT  I have personally briefly reviewed patient's old medical records in [place]  Chief Complaint: Increasing shortness of breath  HPI: Sarah [name] is a 86 y.o. female with medical history significant for Hx of bronchiectasis on chronic steroids and rotates azithromycin/Omnicef monthly, CAD s/p NSTEMI, CVA, hypothyroidism, depression, HLD who presents with increasing shortness of breath since yesterday.   Patient began to notice increasing shortness of breath starting yesterday around 3pm.  Having cough with green sputum.  Had temperature of 100 F.  Does not normally use oxygen at home.  O2 88%-90% on room air.  Tried albuterol and chest vest therapy at home with minimal relief.  Daughter attempted to call pulmonology office but could not get in for appointment and took her to urgent care who advised that she present for further evaluation.  She follows with pulmonologist [name]. Daughter reports that patient coughs all the time and would like her to get a speech evaluation done while she is inpatient as well.  She was afebrile, mildly tachypneic requiring 2L and hypertensive up to systolic of 622W. Lab work notable for leukocytosis of 13.8.  Lactic acid of 1.1.  Normal creatinine of 0.79.  Chest x-ray had no acute abnormalities.  CTA chest pending.  She was started on IV Rocephin, azithromycin in the ED for presumed pneumonia.  Of note, patient was hospitalized in December 2020 for Pseudomonas pneumonia and had to have left pleural effusion requiring thoracentesis and removal of 450 mL of transudative pleural fluid.  Review of Systems:  Constitutional: No Weight Change, No Fever ENT/Mouth: No sore throat, No Rhinorrhea Eyes: No Eye Pain, No Vision Changes Cardiovascular: No Chest Pain,  +SOB Respiratory: + Cough, + Sputum, No Wheezing, + Dyspnea  Gastrointestinal: No Nausea, No Vomiting, No Diarrhea, No Constipation, No Pain Genitourinary: no Urinary Incontinence Musculoskeletal: No Arthralgias, No Myalgias Skin: No Skin Lesions, No Pruritus, Neuro: no Weakness, No Numbness Psych: No Anxiety/Panic, No Depression, no decrease appetite Heme/Lymph: No Bruising, No Bleeding  Past Medical History:  Diagnosis Date  . Allergic rhinitis   . Anxiety    Anxiety attacks  . Arthritis    "a little; in my back and hands" (04/15/2012)  . Asthma   . Bronchiectasis    with history of Legionaires with chronic rales/rhonchi  . Chest pain at rest   . Daily headache    "over the last week" 04/15/2012   . Depression   . Diastolic dysfunction    a. per echo 08/2010 with normal LV function;  b. 06/2011 Echo: EF 65-70%  . GERD (gastroesophageal reflux disease)    "just recently" (04/15/2012)  . H/O hiatal hernia   . H/O Legionnaire's disease 11/1976  . History of blood transfusion 1972   "w/hysterectomy" (04/15/2012)  . Hypercholesteremia   . Hypertension    Negative renal duplex in December of 2012  . Hypothyroidism   . Idiopathic peripheral neuropathy   . MAC (mycobacterium avium-intracellulare complex)   . Migraines    "outgrew them" (04/15/2012)  . NSTEMI (non-ST elevated myocardial infarction) ([place])    a. Normal coronaries per cath August 2012 and negative CT angio for PE;  b. 06/2011 Repeat admission w/ chest pain and elevated troponin's, CTA Chest w/o PE  . Osteopenia 02/2011   t score - 2.1  . Pneumonia    "  recurrent" (04/15/2012)  . Pollen allergies   . Shortness of breath    "sometimes just lying down" (04/15/2012)  . Stroke [facility]) 06/2014   cerebellum    Past Surgical History:  Procedure Laterality Date  . APPENDECTOMY    . CARDIAC CATHETERIZATION  August 2012   Normal coronaries.  Marland Kitchen DILATION AND CURETTAGE OF UTERUS    . TONSILLECTOMY  1930  . TOTAL ABDOMINAL HYSTERECTOMY  W/ BILATERAL SALPINGOOPHORECTOMY  1972   leiomyomata, menorrhagia     reports that she has never smoked. She has never used smokeless tobacco. She reports current alcohol use of about 2.0 standard drinks of alcohol per week. She reports that she does not use drugs.  Allergies  Allergen Reactions  . Codeine Anaphylaxis  . Hydrocodone Nausea And Vomiting and Other (See Comments)    SYNCOPE AND BRADYCARDIA  . Isosorbide Nitrate Other (See Comments)    BRADYCARDIA  . Oxycodone Palpitations    Rapid heart beat  . Clarithromycin Nausea And Vomiting    Has patient had a PCN reaction causing immediate rash, facial/tongue/throat swelling, SOB or lightheadedness with hypotension: Unknown Has patient had a PCN reaction causing severe rash involving mucus membranes or skin necrosis: Unknown Has patient had a PCN reaction that required hospitalization: Unknown Has patient had a PCN reaction occurring within the last 10 years: Unknown If all of the above answers are "NO", then may proceed with Cephalosporin use.   . Fiorinal [Butalbital-Aspirin-Caffeine] Swelling and Other (See Comments)    Facial swelling   . Levofloxacin Nausea And Vomiting and Other (See Comments)    SYNCOPE ALSO  . Augmentin [Amoxicillin-Pot Clavulanate] Diarrhea  . Doxycycline     Sweats and aches    Family History  Problem Relation Age of Onset  . Hypertension Mother        stroke  . Stroke Mother   . Cancer Brother        prostate  . Osteoarthritis Paternal Aunt      Prior to Admission medications   Medication Sig Start Date End Date Taking? Authorizing Provider  acetaminophen (TYLENOL) 500 MG tablet Take 500 mg by mouth every 6 (six) hours as needed for moderate pain.   Yes [provider]  albuterol (PROVENTIL) (2.5 MG/3ML) 0.083% nebulizer solution USE 1 VIAL VIA NEBULIZER TWICE DAILY Patient taking differently: Take 2.5 mg by nebulization every 6 (six) hours as needed for wheezing or shortness of  breath.  08/04/18  Yes Byrum, Rose Fillers, MD  ALPRAZolam Duanne Moron) 0.5 MG tablet Take 1 tablet by mouth as directed. Take 1 tablet (0.5 mg) BID at (8 am & 8 pm) & DAILY PRN FOR ANXIETY 04/06/18  Yes [name], MD  amLODipine (NORVASC) 10 MG tablet Take 0.5 tablets (5 mg total) by mouth daily as needed. May take an additional 0.5 mg tablet as needed for persistent elevated BP 01/04/19  Yes Nahser, Wonda Cheng, MD  azithromycin (ZITHROMAX) 250 MG tablet Take 1 tablet (250 mg total) by mouth daily. Every other month Patient taking differently: Take 250-500 mg by mouth as directed. Take 2 tablets on Day 1. Take 1 tablet daily for Days 2-5. Alternate Every Other Month With Cefdinir 06/29/19  Yes Byrum, Rose Fillers, MD  calcium carbonate (TUMS - DOSED IN MG ELEMENTAL CALCIUM) 500 MG chewable tablet Chew 1 tablet by mouth every 8 (eight) hours as needed for indigestion or heartburn.   Yes [provider]  carvedilol (COREG) 6.25 MG tablet TAKE 1 TABLET(6.25 MG)  BY MOUTH TWICE DAILY 03/16/19  Yes Nahser, Wonda Cheng, MD  cefdinir (OMNICEF) 300 MG capsule Take 300 mg by mouth 2 (two) times daily. Every other month. Alternate with Azithromycin   Yes [provider]  clopidogrel (PLAVIX) 75 MG tablet TAKE 1 TABLET(75 MG) BY MOUTH DAILY Patient taking differently: Take 75 mg by mouth daily.  10/08/18  Yes Nahser, Wonda Cheng, MD  DULoxetine (CYMBALTA) 20 MG capsule Take 20 mg by mouth 3 (three) times daily. 06/14/14  Yes [provider]  escitalopram (LEXAPRO) 5 MG tablet Take 5 mg by mouth daily.    Yes [provider]  Iron-FA-B Cmp-C-Biot-Probiotic (FUSION PLUS PO) Take 1 tablet by mouth daily.   Yes [provider]  levothyroxine (SYNTHROID, LEVOTHROID) 50 MCG tablet Take 50 mcg by mouth daily.     Yes [provider]  loratadine (CLARITIN) 10 MG tablet Take 10 mg by mouth daily.   Yes [provider]  pantoprazole (PROTONIX) 40 MG tablet Take 40 mg by mouth 2  (two) times daily.   Yes [provider]  predniSONE (DELTASONE) 10 MG tablet TAKE 1 TABLET(10 MG) BY MOUTH DAILY WITH BREAKFAST 05/14/19  Yes Byrum, Rose Fillers, MD  ROCKLATAN 0.02-0.005 % SOLN Apply 1 drop to eye at bedtime. 04/07/19  Yes [provider]  Sennosides (SENOKOT PO) Take 1 tablet by mouth daily as needed (constipation).    Yes [provider]  SIMBRINZA 1-0.2 % SUSP Place 1 drop into the left eye in the morning, at noon, and at bedtime.  04/07/19  Yes [provider]  ALPHAGAN P 0.1 % SOLN Place 1 drop into the left eye every 8 (eight) hours.  Patient not taking: Reported on 07/02/2019 11/26/18   [provider]  gabapentin (NEURONTIN) 100 MG capsule Hold for now. IF need to resume please resume at 168m at night only and advance as needed to 3090mat night. Patient not taking: Reported on 07/02/2019 12/23/18   BaErick ColaceNP  NONFORMULARY OR COMPOUNDED ITEM [facility]:  Neuropathy Cream #11 - Bupivacaine 1%, Doxepin 3%, Gabapentin 6%, Pentoxifylline 3%, Topiramate 1%. Apply 1-2 grams to affected areas 3-4 times a day. 07/01/19   GaMarzetta BoardDPM    Physical Exam: Vitals:   07/02/19 1904 07/02/19 1914 07/02/19 1915 07/02/19 2110  BP:  (!) 181/99 (!) 177/101 (!) 163/111  Pulse: 88 93 87 89  Resp:  (!) 24 (!) 29 17  TempSrc:      SpO2:  95% 96% 98%    Constitutional: NAD, calm, comfortable, thin elderly chronically ill-appearing female laying at 20 degree incline in bed.  Has hearing impairment. Vitals:   07/02/19 1904 07/02/19 1914 07/02/19 1915 07/02/19 2110  BP:  (!) 181/99 (!) 177/101 (!) 163/111  Pulse: 88 93 87 89  Resp:  (!) 24 (!) 29 17  TempSrc:      SpO2:  95% 96% 98%   Eyes: PERRL, lids and conjunctivae normal.  Mild edema beneath both eyes. ENMT: Mucous membranes are moist.  Neck: normal, supple, no masses, no thyromegaly Respiratory: Diffuse crackles but no wheezing. Normal respiratory effort on 2 L  via nasal cannula of O2. No accessory muscle use.  Cardiovascular: Regular rate and rhythm, no murmurs / rubs / gallops.  +3 pitting edema of the right ankle.  abdomen: no tenderness, no masses palpated. Bowel sounds positive.  GU: Pure wick catheter in place with urine output Musculoskeletal: no clubbing / cyanosis. No joint  deformity upper and lower extremities. Good ROM, no contractures. Normal muscle tone.  Skin: no rashes, lesions, ulcers. No induration Neurologic: CN 2-12 grossly intact. Sensation intact. Strength 5/5 in all 4.  Psychiatric: Normal judgment and insight. Alert and oriented x 3. Normal mood.     Labs on Admission: I have personally reviewed following labs and imaging studies  CBC: Recent Labs  Lab 07/02/19 1807  WBC 15.8*  NEUTROABS 13.3*  HGB 14.0  HCT 44.8  MCV 103.9*  PLT 761   Basic Metabolic Panel: Recent Labs  Lab 07/02/19 1807  NA 137  K 3.7  CL 96*  CO2 30  GLUCOSE 89  BUN 14  CREATININE 0.79  CALCIUM 8.7*   GFR: CrCl cannot be calculated (Unknown ideal weight.). Liver Function Tests: No results for input(s): AST, ALT, ALKPHOS, BILITOT, PROT, ALBUMIN in the last 168 hours. No results for input(s): LIPASE, AMYLASE in the last 168 hours. No results for input(s): AMMONIA in the last 168 hours. Coagulation Profile: No results for input(s): INR, PROTIME in the last 168 hours. Cardiac Enzymes: No results for input(s): CKTOTAL, CKMB, CKMBINDEX, TROPONINI in the last 168 hours. BNP (last 3 results) No results for input(s): PROBNP in the last 8760 hours. HbA1C: No results for input(s): HGBA1C in the last 72 hours. CBG: No results for input(s): GLUCAP in the last 168 hours. Lipid Profile: No results for input(s): CHOL, HDL, LDLCALC, TRIG, CHOLHDL, LDLDIRECT in the last 72 hours. Thyroid Function Tests: No results for input(s): TSH, T4TOTAL, FREET4, T3FREE, THYROIDAB in the last 72 hours. Anemia Panel: No results for input(s): VITAMINB12,  FOLATE, FERRITIN, TIBC, IRON, RETICCTPCT in the last 72 hours. Urine analysis:    Component Value Date/Time   COLORURINE YELLOW 12/16/2018 1426   APPEARANCEUR HAZY (A) 12/16/2018 1426   LABSPEC 1.016 12/16/2018 1426   PHURINE 7.0 12/16/2018 1426   GLUCOSEU NEGATIVE 12/16/2018 1426   HGBUR NEGATIVE 12/16/2018 1426   BILIRUBINUR NEGATIVE 12/16/2018 1426   KETONESUR 5 (A) 12/16/2018 1426   PROTEINUR NEGATIVE 12/16/2018 1426   UROBILINOGEN 1.0 10/06/2014 1730   NITRITE NEGATIVE 12/16/2018 1426   LEUKOCYTESUR NEGATIVE 12/16/2018 1426    Radiological Exams on Admission: DG Chest Port 1 View  Result Date: 07/02/2019 CLINICAL DATA:  86 year old with cough and shortness of breath. EXAM: PORTABLE CHEST 1 VIEW COMPARISON:  Radiograph 02/09/2019.  Chest CT 01/26/2017 FINDINGS: Unchanged heart size and mediastinal contours. Retrocardiac hiatal hernia. Blunting of the left costophrenic angle appears chronic and related to scarring. Stable scarring in the perifissural right upper lobe. No evidence of acute or focal airspace disease. No pulmonary edema, pneumothorax, or large pleural effusion. Scoliotic curvature of the spine with stable osseous structures. IMPRESSION: 1. No acute abnormality.  Bilateral lung scarring. 2. Hiatal hernia. Electronically Signed   By: Keith Rake M.D.   On: 07/02/2019 19:00      Assessment/Plan  Acute hypoxic respiratory failure secondary to presumed pneumonia vs   Bronchiectasis flare Received IV Rocephin and azithromycin in ED.  Will switch to Unasyn given chronic aspiration seen on CTA chest. Patient does have history of Pseudomonas pneumonia.  Will obtain sputum cultures.Obtain PCT.  Scheduled albuterol nebulizer q6hr Incentive spirometry/flutter valve Patient is on chronic antibiotics for bronchiectasis.  Will do daily IV methylprednisone 40 mg.  Hypertension Continue Coreg.  As needed IV hydralazine for systolic greater than 607/PXTGGYIRS greater than  854.  Hx of CAD Asymptomatic  History of CVA Continue Plavix  History anxiety Continue Lexapro and Xanax  Hypothyroidism Continue Synthroid  Glaucoma Continue eyedrops brought from home  DVT prophylaxis:.Lovenox Code Status: DNR- daughter says they have paperwork at home Family Communication: Plan discussed with patient and daughter at bedside  disposition Plan: Home with at least 2 midnight stays  Consults called:  Admission status: inpatient  Status is: Inpatient  Remains inpatient appropriate because:Inpatient level of care appropriate due to severity of illness   Dispo: The patient is from: Home              Anticipated d/c is to: Home              Anticipated d/c date is: > 3 days              Patient currently is not medically stable to d/c.         Orene Desanctis DO [facility]   If 7PM-7AM, please contact night-coverage [email or web address]   07/02/2019, 9:15 PM

## 2019-07-02 NOTE — Telephone Encounter (Signed)
Dr. Delton Coombes please advise on how patient should follow up.

## 2019-07-02 NOTE — ED Notes (Signed)
o2 @ 2L via [place] placed

## 2019-07-02 NOTE — Telephone Encounter (Signed)
Per Dr. Neville Route recommendations, Please schedule for OV Monday or Tuesday of next week.Thanks

## 2019-07-02 NOTE — ED Provider Notes (Addendum)
[facility]    CSN: [id number] Arrival date & time: 07/02/19  1653      History   Chief Complaint Chief Complaint  Patient presents with  . Shortness of Breath    HPI Sarah [name] is a 86 y.o. female with history of bronchiectasis, diastolic dysfunction, hypertension, cerebellar stroke, legionnaires disease presenting for increased shortness of breath.  Patient accompanied by her daughter provides history: Patient started azithromycin today for recurrent bronchiectasis.  States she takes this about every other month, routinely followed by pulmonology.  Endorsing fever yesterday, Tmax 100F.  Home O2 readings of 83% on room air.  Has taken Tylenol for fever with some relief.  Also noting lower extremity edema.  Past Medical History:  Diagnosis Date  . Allergic rhinitis   . Anxiety    Anxiety attacks  . Arthritis    "a little; in my back and hands" (04/15/2012)  . Asthma   . Bronchiectasis    with history of Legionaires with chronic rales/rhonchi  . Chest pain at rest   . Daily headache    "over the last week" 04/15/2012   . Depression   . Diastolic dysfunction    a. per echo 08/2010 with normal LV function;  b. 06/2011 Echo: EF 65-70%  . GERD (gastroesophageal reflux disease)    "just recently" (04/15/2012)  . H/O hiatal hernia   . H/O Legionnaire's disease 11/1976  . History of blood transfusion 1972   "w/hysterectomy" (04/15/2012)  . Hypercholesteremia   . Hypertension    Negative renal duplex in December of 2012  . Hypothyroidism   . Idiopathic peripheral neuropathy   . MAC (mycobacterium avium-intracellulare complex)   . Migraines    "outgrew them" (04/15/2012)  . NSTEMI (non-ST elevated myocardial infarction) ([place])    a. Normal coronaries per cath August 2012 and negative CT angio for PE;  b. 06/2011 Repeat admission w/ chest pain and elevated troponin's, CTA Chest w/o PE  . Osteopenia 02/2011   t score - 2.1  . Pneumonia    "recurrent" (04/15/2012)  . Pollen  allergies   . Shortness of breath    "sometimes just lying down" (04/15/2012)  . Stroke [facility]) 06/2014   cerebellum    Patient Active Problem List   Diagnosis Date Noted  . S/P thoracentesis   . Pleural effusion   . Pneumonia of left lung due to infectious organism 12/16/2018  . Community acquired pneumonia 12/16/2018  . HCAP (healthcare-associated pneumonia) 12/16/2018  . Leukocytosis   . Hemoptysis 03/16/2018  . GERD (gastroesophageal reflux disease) 03/19/2017  . Nausea vomiting and diarrhea   . Gastroenteritis due to norovirus 01/25/2017  . Adrenal insufficiency ([place]) 03/21/2015  . HTN (hypertension) 02/14/2015  . Fecal impaction ([place]) 10/07/2014  . Colitis 10/06/2014  . Cerebral infarction due to thrombosis of basilar artery ([place]) 09/09/2014  . Aneurysm, cerebral, nonruptured 09/09/2014  . HLD (hyperlipidemia) 09/09/2014  . Acute ischemic stroke ([place]) 07/09/2014  . Chronic diastolic CHF (congestive heart failure) ([place]) 07/09/2014  . Stroke ([place]) 07/08/2014  . TIA (transient ischemic attack) 07/08/2014  . Chest pain 04/15/2012  . Shortness of breath dyspnea 11/23/2011  . Anxiety 11/23/2011  . Malignant hypertension 11/23/2011  . Elevated troponin 06/20/2011  . Depression   . Hypothyroidism   . Arthritis   . Racing heart beat 02/11/2011  . NSTEMI (non-ST elevated myocardial infarction) ([place]) 09/24/2010  . Idiopathic peripheral neuropathy   . Pollen allergies   . BRONCHIECTASIS 07/29/2008  . Essential hypertension 06/15/2008  .  Allergic rhinitis 06/15/2008  . Asthma 06/15/2008  . Cough 06/15/2008    Past Surgical History:  Procedure Laterality Date  . APPENDECTOMY    . CARDIAC CATHETERIZATION  August 2012   Normal coronaries.  Marland Kitchen DILATION AND CURETTAGE OF UTERUS    . TONSILLECTOMY  1930  . TOTAL ABDOMINAL HYSTERECTOMY W/ BILATERAL SALPINGOOPHORECTOMY  1972   leiomyomata, menorrhagia    OB History    Gravida  2   Para  2   Term      Preterm      AB       Living  2     SAB      TAB      Ectopic      Multiple      Live Births               Home Medications    Prior to Admission medications   Medication Sig Start Date End Date Taking? Authorizing Provider  albuterol (PROVENTIL) (2.5 MG/3ML) 0.083% nebulizer solution USE 1 VIAL VIA NEBULIZER TWICE DAILY Patient taking differently: Take 2.5 mg by nebulization every 6 (six) hours as needed for wheezing or shortness of breath.  08/04/18  Yes Byrum, Rose Fillers, MD  ALPHAGAN P 0.1 % SOLN Place 1 drop into the left eye every 8 (eight) hours.  11/26/18   [provider]  ALPRAZolam Duanne Moron) 0.5 MG tablet Take 1 tablet by mouth 2 (two) times daily.  04/06/18   [name], MD  amLODipine (NORVASC) 10 MG tablet Take 0.5 tablets (5 mg total) by mouth daily as needed. May take an additional 0.5 mg tablet as needed for persistent elevated BP 01/04/19   Nahser, Wonda Cheng, MD  azithromycin (ZITHROMAX) 250 MG tablet Take 1 tablet (250 mg total) by mouth daily. Every other month 06/29/19   Collene Gobble, MD  calcium carbonate (TUMS - DOSED IN MG ELEMENTAL CALCIUM) 500 MG chewable tablet Chew 1 tablet by mouth every 8 (eight) hours as needed for indigestion or heartburn.    [provider]  carvedilol (COREG) 6.25 MG tablet TAKE 1 TABLET(6.25 MG) BY MOUTH TWICE DAILY 03/16/19   Nahser, Wonda Cheng, MD  cefdinir (OMNICEF) 300 MG capsule Take 300 mg by mouth 2 (two) times daily. Every other month    [provider]  clopidogrel (PLAVIX) 75 MG tablet TAKE 1 TABLET(75 MG) BY MOUTH DAILY Patient taking differently: Take 75 mg by mouth daily.  10/08/18   Nahser, Wonda Cheng, MD  DULoxetine (CYMBALTA) 20 MG capsule Take 20 mg by mouth 3 (three) times daily. 06/14/14   [provider]  escitalopram (LEXAPRO) 5 MG tablet Take 5 mg by mouth daily.     [provider]  gabapentin (NEURONTIN) 100 MG capsule Hold for now. IF need to resume please resume at 190m at night only  and advance as needed to 3065mat night. 12/23/18   BaErick ColaceNP  Iron-FA-B Cmp-C-Biot-Probiotic (FUSION PLUS PO) Take 1 tablet by mouth daily.    [provider]  latanoprost (XALATAN) 0.005 % ophthalmic solution Place 1 drop into both eyes at bedtime.  08/30/14   [provider]  levothyroxine (SYNTHROID, LEVOTHROID) 50 MCG tablet Take 50 mcg by mouth daily.      [provider]  NONFORMULARY OR COMPOUNDED ITEM [facility]:  Neuropathy Cream #11 - Bupivacaine 1%, Doxepin 3%, Gabapentin 6%, Pentoxifylline 3%, Topiramate 1%. Apply 1-2 grams to affected areas 3-4 times a day. 07/01/19  Marzetta Board, DPM  pantoprazole (PROTONIX) 40 MG tablet Take 40 mg by mouth 2 (two) times daily.    [provider]  predniSONE (DELTASONE) 10 MG tablet TAKE 1 TABLET(10 MG) BY MOUTH DAILY WITH BREAKFAST 05/14/19   Byrum, Rose Fillers, MD  ROCKLATAN 0.02-0.005 % SOLN Apply 1 drop to eye at bedtime. 04/07/19   [provider]  Sennosides (SENOKOT PO) Take 1 tablet by mouth daily as needed (constipation).     [provider]  SIMBRINZA 1-0.2 % SUSP INSTIL 1 DROP INTO THE LEFT EYE THREE TIMES DAILY FOR 90 DAYS 04/07/19   [provider]    Family History Family History  Problem Relation Age of Onset  . Hypertension Mother        stroke  . Stroke Mother   . Cancer Brother        prostate  . Osteoarthritis Paternal Aunt     Social History Social History   Tobacco Use  . Smoking status: Never Smoker  . Smokeless tobacco: Never Used  Vaping Use  . Vaping Use: Never used  Substance Use Topics  . Alcohol use: Yes    Alcohol/week: 2.0 standard drinks    Types: 2 Glasses of wine per week    Comment: wine 1-2 week  . Drug use: No     Allergies   Codeine, Hydrocodone, Isosorbide nitrate, Oxycodone, Clarithromycin, Fiorinal [butalbital-aspirin-caffeine], Levofloxacin, Augmentin [amoxicillin-pot clavulanate], and  Doxycycline   Review of Systems As per HPI   Physical Exam Triage Vital Signs ED Triage Vitals  Enc Vitals Group     BP 07/02/19 1708 (!) 149/92     Pulse Rate 07/02/19 1708 (!) 103     Resp 07/02/19 1708 (!) 38     Temp 07/02/19 1708 97.7 F (36.5 C)     Temp Source 07/02/19 1708 Oral     SpO2 07/02/19 1708 (!) 85 %     Weight --      Height --      Head Circumference --      Peak Flow --      Pain Score 07/02/19 1709 2     Pain Loc --      Pain Edu? --      Excl. in [place]? --    No data found.  Updated Vital Signs BP (!) 149/92 (BP Location: Right Arm)   Pulse (!) 103   Temp 97.7 F (36.5 C) (Oral)   Resp (!) 38   LMP 01/14/1970   SpO2 94%   Visual Acuity Right Eye Distance:   Left Eye Distance:   Bilateral Distance:    Right Eye Near:   Left Eye Near:    Bilateral Near:     Physical Exam Constitutional:      General: She is not in acute distress.    Appearance: She is ill-appearing.  HENT:     Head: Normocephalic and atraumatic.  Eyes:     General: No scleral icterus.    Pupils: Pupils are equal, round, and reactive to light.  Neck:     Vascular: No JVD.     Trachea: No tracheal deviation.  Cardiovascular:     Rate and Rhythm: Regular rhythm. Tachycardia present.  Pulmonary:     Effort: Tachypnea and accessory muscle usage present.     Breath sounds: No stridor. Rhonchi and rales present.     Comments: [place] (R>L) Skin:    Coloration: Skin is not jaundiced or pale.  Neurological:  Mental Status: She is alert and oriented to person, place, and time.      UC Treatments / Results  Labs (all labs ordered are listed, but only abnormal results are displayed) Labs Reviewed - No data to display  EKG   Radiology No results found.  Procedures Procedures (including critical care time)  Medications Ordered in UC Medications - No data to display  Initial Impression / Assessment and Plan / UC Course  I have reviewed the triage vital  signs and the nursing notes.  Pertinent labs & imaging results that were available during my care of the patient were reviewed by me and considered in my medical decision making (see chart for details).     Patient tachypneic, tachycardic, mildly hypertensive, and hypoxic.  O2 saturation up to 94% on 2 L O2 via [place].  Given patient's age, comorbidities, clinical status, recommended patient go to ER for further evaluation & management.  Daughter agrees to this: Patient transported to [facility] via EMS in stable condition. Final Clinical Impressions(s) / UC Diagnoses   Final diagnoses:  Tachypnea  Hypoxia  Tachycardia  Personal history of bronchiectasis  SOB (shortness of breath)  3+ pitting edema   Discharge Instructions   None    ED Prescriptions    None     PDMP not reviewed this encounter.   Hall-Potvin, [place], PA-C 07/02/19 [place], [place], [place] 07/02/19 1834

## 2019-07-02 NOTE — ED Notes (Signed)
Patient is being discharged from the Urgent Care and sent to the Emergency Department via stretcher/EMs . Per [place] Hall-Potvin, [place], patient is in need of higher level of care due to hypoxia, SOB. Patient is aware and verbalizes understanding of plan of care.  Vitals:   07/02/19 1708 07/02/19 1728  BP: (!) 149/92   Pulse: (!) 103   Resp: (!) 38   Temp: 97.7 F (36.5 C)   SpO2: (!) 85% 94%

## 2019-07-02 NOTE — ED Notes (Addendum)
Delay in report due to RN discharging 2 other patients and receiving a new admission.

## 2019-07-02 NOTE — ED Triage Notes (Signed)
Pt c/o productive cough with "dark" sputum, SOB since yesterday. Pt tachypneic, with increased WOB, coarse lung sounds bilaterally with wheezes throughout. Pt states she started taking Z-pack today for resp symptoms.  Reports SpO2 at home of 83% on RA. Tmax of 100 last night. Pt c/o left rib pain. Fingers pale, warm. Last dose tylenol today at approx 1400. +2 edema BLE.   Med provider advised of pt status and at [facility] for eval. Med provider explained to pt and her daughter/s of recommendation for pt to go to ER for higher level eval/tx 2/2 resp status and WOB.

## 2019-07-02 NOTE — Telephone Encounter (Signed)
Agree with starting the azithro early. Continue her scheduled nebs.  She needs a OV or tele-visit beginning of next week please.

## 2019-07-02 NOTE — Telephone Encounter (Signed)
Spoke with patient's daughter.  Explained Dr. Kavin Leech recommendations.  Daughter stated she may take her to the urgent care for a chest x-ray.  Advised to take her to the ER if her sats go to 88% or lower and remain there or her breathing becomes labored.  Patient scheduled for a virtual visit for Monday at 4:30 pm as there were no in person OV available.  Nothing further needed.

## 2019-07-02 NOTE — Telephone Encounter (Signed)
Left message for patient

## 2019-07-02 NOTE — ED Triage Notes (Signed)
Pt arrives [facility] from [facility]. Per EMS: Pt complains of worsening SHOB for "a couple days" Pt reports SHOB and cough chronically but worse recently with productive cough. Pt reports albuterol is not helping. Pt has hx of COPD. Pt seen at [facility] and prescribed abx today but has not taken any abx. Pt did not receive CRX at UC.  Pt has increased WOB

## 2019-07-02 NOTE — ED Provider Notes (Signed)
[facility] DEPT Provider Note   CSN: [id number] Arrival date & time: 07/02/19  1759     History Chief Complaint  Patient presents with  . Shortness of Breath    Sarah [name] is a 86 y.o. female.  She is brought in by EMS after going to urgent care today.  She has had increased cough and shortness of breath for the past 3 days.  She has chronic pulmonary issues including bronchiectasis and follows with [name] pulmonology.  Just started on Zithromax today.  Reports low-grade fever and low pulse ox.  Not on oxygen at baseline.  Cough productive of some green sputum.  The history is provided by the patient and the EMS personnel.  Shortness of Breath Severity:  Moderate Onset quality:  Gradual Timing:  Constant Progression:  Worsening Chronicity:  Recurrent Relieved by:  Nothing Worsened by:  Activity and coughing Ineffective treatments:  Inhaler Associated symptoms: chest pain, cough, fever and sputum production   Associated symptoms: no abdominal pain, no headaches, no hemoptysis, no neck pain, no rash, no sore throat and no vomiting   Risk factors: no tobacco use        Past Medical History:  Diagnosis Date  . Allergic rhinitis   . Anxiety    Anxiety attacks  . Arthritis    "a little; in my back and hands" (04/15/2012)  . Asthma   . Bronchiectasis    with history of Legionaires with chronic rales/rhonchi  . Chest pain at rest   . Daily headache    "over the last week" 04/15/2012   . Depression   . Diastolic dysfunction    a. per echo 08/2010 with normal LV function;  b. 06/2011 Echo: EF 65-70%  . GERD (gastroesophageal reflux disease)    "just recently" (04/15/2012)  . H/O hiatal hernia   . H/O Legionnaire's disease 11/1976  . History of blood transfusion 1972   "w/hysterectomy" (04/15/2012)  . Hypercholesteremia   . Hypertension    Negative renal duplex in December of 2012  . Hypothyroidism   . Idiopathic peripheral neuropathy   .  MAC (mycobacterium avium-intracellulare complex)   . Migraines    "outgrew them" (04/15/2012)  . NSTEMI (non-ST elevated myocardial infarction) ([place])    a. Normal coronaries per cath August 2012 and negative CT angio for PE;  b. 06/2011 Repeat admission w/ chest pain and elevated troponin's, CTA Chest w/o PE  . Osteopenia 02/2011   t score - 2.1  . Pneumonia    "recurrent" (04/15/2012)  . Pollen allergies   . Shortness of breath    "sometimes just lying down" (04/15/2012)  . Stroke [facility]) 06/2014   cerebellum    Patient Active Problem List   Diagnosis Date Noted  . S/P thoracentesis   . Pleural effusion   . Pneumonia of left lung due to infectious organism 12/16/2018  . Community acquired pneumonia 12/16/2018  . HCAP (healthcare-associated pneumonia) 12/16/2018  . Leukocytosis   . Hemoptysis 03/16/2018  . GERD (gastroesophageal reflux disease) 03/19/2017  . Nausea vomiting and diarrhea   . Gastroenteritis due to norovirus 01/25/2017  . Adrenal insufficiency ([place]) 03/21/2015  . HTN (hypertension) 02/14/2015  . Fecal impaction ([place]) 10/07/2014  . Colitis 10/06/2014  . Cerebral infarction due to thrombosis of basilar artery ([place]) 09/09/2014  . Aneurysm, cerebral, nonruptured 09/09/2014  . HLD (hyperlipidemia) 09/09/2014  . Acute ischemic stroke ([place]) 07/09/2014  . Chronic diastolic CHF (congestive heart failure) ([place]) 07/09/2014  . Stroke [facility])  07/08/2014  . TIA (transient ischemic attack) 07/08/2014  . Chest pain 04/15/2012  . Shortness of breath dyspnea 11/23/2011  . Anxiety 11/23/2011  . Malignant hypertension 11/23/2011  . Elevated troponin 06/20/2011  . Depression   . Hypothyroidism   . Arthritis   . Racing heart beat 02/11/2011  . NSTEMI (non-ST elevated myocardial infarction) ([place]) 09/24/2010  . Idiopathic peripheral neuropathy   . Pollen allergies   . BRONCHIECTASIS 07/29/2008  . Essential hypertension 06/15/2008  . Allergic rhinitis 06/15/2008  . Asthma 06/15/2008    . Cough 06/15/2008    Past Surgical History:  Procedure Laterality Date  . APPENDECTOMY    . CARDIAC CATHETERIZATION  August 2012   Normal coronaries.  Marland Kitchen DILATION AND CURETTAGE OF UTERUS    . TONSILLECTOMY  1930  . TOTAL ABDOMINAL HYSTERECTOMY W/ BILATERAL SALPINGOOPHORECTOMY  1972   leiomyomata, menorrhagia     OB History    Gravida  2   Para  2   Term      Preterm      AB      Living  2     SAB      TAB      Ectopic      Multiple      Live Births              Family History  Problem Relation Age of Onset  . Hypertension Mother        stroke  . Stroke Mother   . Cancer Brother        prostate  . Osteoarthritis Paternal Aunt     Social History   Tobacco Use  . Smoking status: Never Smoker  . Smokeless tobacco: Never Used  Vaping Use  . Vaping Use: Never used  Substance Use Topics  . Alcohol use: Yes    Alcohol/week: 2.0 standard drinks    Types: 2 Glasses of wine per week    Comment: wine 1-2 week  . Drug use: No    Home Medications Prior to Admission medications   Medication Sig Start Date End Date Taking? Authorizing Provider  albuterol (PROVENTIL) (2.5 MG/3ML) 0.083% nebulizer solution USE 1 VIAL VIA NEBULIZER TWICE DAILY Patient taking differently: Take 2.5 mg by nebulization every 6 (six) hours as needed for wheezing or shortness of breath.  08/04/18   Byrum, Rose Fillers, MD  ALPHAGAN P 0.1 % SOLN Place 1 drop into the left eye every 8 (eight) hours.  11/26/18   [provider]  ALPRAZolam Duanne Moron) 0.5 MG tablet Take 1 tablet by mouth 2 (two) times daily.  04/06/18   [name], MD  amLODipine (NORVASC) 10 MG tablet Take 0.5 tablets (5 mg total) by mouth daily as needed. May take an additional 0.5 mg tablet as needed for persistent elevated BP 01/04/19   Nahser, Wonda Cheng, MD  azithromycin (ZITHROMAX) 250 MG tablet Take 1 tablet (250 mg total) by mouth daily. Every other month 06/29/19   Collene Gobble, MD  calcium carbonate  (TUMS - DOSED IN MG ELEMENTAL CALCIUM) 500 MG chewable tablet Chew 1 tablet by mouth every 8 (eight) hours as needed for indigestion or heartburn.    [provider]  carvedilol (COREG) 6.25 MG tablet TAKE 1 TABLET(6.25 MG) BY MOUTH TWICE DAILY 03/16/19   Nahser, Wonda Cheng, MD  cefdinir (OMNICEF) 300 MG capsule Take 300 mg by mouth 2 (two) times daily. Every other month    [provider]  clopidogrel (PLAVIX) 75  MG tablet TAKE 1 TABLET(75 MG) BY MOUTH DAILY Patient taking differently: Take 75 mg by mouth daily.  10/08/18   Nahser, Wonda Cheng, MD  DULoxetine (CYMBALTA) 20 MG capsule Take 20 mg by mouth 3 (three) times daily. 06/14/14   [provider]  escitalopram (LEXAPRO) 5 MG tablet Take 5 mg by mouth daily.     [provider]  gabapentin (NEURONTIN) 100 MG capsule Hold for now. IF need to resume please resume at 132m at night only and advance as needed to 3010mat night. 12/23/18   BaErick ColaceNP  Iron-FA-B Cmp-C-Biot-Probiotic (FUSION PLUS PO) Take 1 tablet by mouth daily.    [provider]  latanoprost (XALATAN) 0.005 % ophthalmic solution Place 1 drop into both eyes at bedtime.  08/30/14   [provider]  levothyroxine (SYNTHROID, LEVOTHROID) 50 MCG tablet Take 50 mcg by mouth daily.      [provider]  NONFORMULARY OR COMPOUNDED ITEM [facility]:  Neuropathy Cream #11 - Bupivacaine 1%, Doxepin 3%, Gabapentin 6%, Pentoxifylline 3%, Topiramate 1%. Apply 1-2 grams to affected areas 3-4 times a day. 07/01/19   GaMarzetta BoardDPM  pantoprazole (PROTONIX) 40 MG tablet Take 40 mg by mouth 2 (two) times daily.    [provider]  predniSONE (DELTASONE) 10 MG tablet TAKE 1 TABLET(10 MG) BY MOUTH DAILY WITH BREAKFAST 05/14/19   Byrum, RoRose FillersMD  ROCKLATAN 0.02-0.005 % SOLN Apply 1 drop to eye at bedtime. 04/07/19   [provider]  Sennosides (SENOKOT PO) Take 1 tablet by mouth daily as needed  (constipation).     [provider]  SIMBRINZA 1-0.2 % SUSP INSTIL 1 DROP INTO THE LEFT EYE THREE TIMES DAILY FOR 90 DAYS 04/07/19   [provider]    Allergies    Codeine, Hydrocodone, Isosorbide nitrate, Oxycodone, Clarithromycin, Fiorinal [butalbital-aspirin-caffeine], Levofloxacin, Augmentin [amoxicillin-pot clavulanate], and Doxycycline  Review of Systems   Review of Systems  Constitutional: Positive for fever.  HENT: Negative for sore throat.   Eyes: Negative for visual disturbance.  Respiratory: Positive for cough, sputum production and shortness of breath. Negative for hemoptysis.   Cardiovascular: Positive for chest pain.  Gastrointestinal: Negative for abdominal pain and vomiting.  Genitourinary: Negative for dysuria.  Musculoskeletal: Negative for neck pain.  Skin: Negative for rash.  Neurological: Negative for headaches.    Physical Exam Updated Vital Signs BP (!) 145/77 (BP Location: Right Arm)   Pulse 92   Temp 98 F (36.7 C) (Oral)   Resp 18   Ht _0  (1.676 m)   Wt 57.4 kg   LMP 01/14/1970   SpO2 94%   BMI 20.42 kg/m   Physical Exam Vitals and nursing note reviewed.  Constitutional:      General: She is not in acute distress.    Appearance: She is well-developed.  HENT:     Head: Normocephalic and atraumatic.  Eyes:     Conjunctiva/sclera: Conjunctivae normal.  Cardiovascular:     Rate and Rhythm: Normal rate and regular rhythm.     Heart sounds: No murmur heard.   Pulmonary:     Effort: Tachypnea and accessory muscle usage present. No respiratory distress.     Breath sounds: Wheezing (few scattered) and rhonchi (diffuse) present.  Abdominal:     Palpations: Abdomen is soft.     Tenderness: There is no abdominal tenderness.  Musculoskeletal:        General: Normal range of motion.  Cervical back: Neck supple.     Right lower leg: No edema.     Left lower leg: No edema.  Skin:    General: Skin is warm and dry.      Capillary Refill: Capillary refill takes less than 2 seconds.     Findings: Ecchymosis present.  Neurological:     General: No focal deficit present.     Mental Status: She is alert and oriented to person, place, and time.     ED Results / Procedures / Treatments   Labs (all labs ordered are listed, but only abnormal results are displayed) Labs Reviewed  BASIC METABOLIC PANEL - Abnormal; Notable for the following components:      Result Value   Chloride 96 (*)    Calcium 8.7 (*)    All other components within normal limits  CBC WITH DIFFERENTIAL/PLATELET - Abnormal; Notable for the following components:   WBC 15.8 (*)    MCV 103.9 (*)    Neutro Abs 13.3 (*)    Monocytes Absolute 1.3 (*)    All other components within normal limits  BRAIN NATRIURETIC PEPTIDE - Abnormal; Notable for the following components:   B Natriuretic Peptide 183.9 (*)    All other components within normal limits  BASIC METABOLIC PANEL - Abnormal; Notable for the following components:   Chloride 97 (*)    CO2 33 (*)    Calcium 8.7 (*)    All other components within normal limits  CBC - Abnormal; Notable for the following components:   WBC 15.1 (*)    MCV 105.4 (*)    All other components within normal limits  CULTURE, BLOOD (ROUTINE X 2)  CULTURE, BLOOD (ROUTINE X 2)  SARS CORONAVIRUS 2 BY RT PCR (HOSPITAL ORDER, [facility])  EXPECTORATED SPUTUM ASSESSMENT W REFEX TO RESP CULTURE  LACTIC ACID, PLASMA  LACTIC ACID, PLASMA  PROCALCITONIN  TROPONIN I (HIGH SENSITIVITY)  TROPONIN I (HIGH SENSITIVITY)    EKG EKG Interpretation  Date/Time:  Friday July 02 2019 18:22:42 EDT Ventricular Rate:  92 PR Interval:    QRS Duration: 89 QT Interval:  344 QTC Calculation: 426 R Axis:   -24 Text Interpretation: Sinus rhythm Borderline left axis deviation Borderline T wave abnormalities No significant change since prior 12/20 Confirmed by Aletta Edouard [phone]) on 07/02/2019 6:24:50  PM   Radiology CT Angio Chest PE W/Cm &/Or Wo Cm  Result Date: 07/02/2019 CLINICAL DATA:  Shortness of breath and productive cough fever since yesterday EXAM: CT ANGIOGRAPHY CHEST WITH CONTRAST TECHNIQUE: Multidetector CT imaging of the chest was performed using the standard protocol during bolus administration of intravenous contrast. Multiplanar CT image reconstructions and MIPs were obtained to evaluate the vascular anatomy. CONTRAST:  177m OMNIPAQUE IOHEXOL 350 MG/ML SOLN COMPARISON:  CT 01/26/2017, radiograph 07/02/2019 FINDINGS: Cardiovascular: Sagittal opacification of pulmonary arteries. No central, lobar or proximal segmental filling defects. More distal evaluation may be limited by extensive respiratory motion artifact which is pronounced towards the lung bases. Central pulmonary arteries are within normal limits for caliber. Punctate gas in the main pulmonary artery likely related to intravenous access. Overall, the cardiac size is within normal limits albeit with some mild right heart enlargement and reflux of contrast into the IVC and hepatic veins. Scant coronary calcium is noted. No pericardial effusion. Atherosclerotic plaque within the normal caliber aorta. Normal 3 vessel branching of the aortic arch. Minimal plaque in the proximal great vessels. No acute luminal abnormality of the  thoracic aorta or proximal great vessels. No periaortic stranding or hemorrhage. Mediastinum/Nodes: Scattered low-attenuation mediastinal and hilar nodes many of which are subcentimeter, favored to be reactive. The largest node is a 13 mm precarinal lymph node however this is stable from comparison study. Few stable calcified thyroid nodules. No further imaging is warranted based on patient age. This follows consensus guidelines: Managing Incidental Thyroid Nodules Detected on Imaging: White Paper of the ACR Incidental Thyroid Findings Committee. J Am Coll Radiol 2015; 12:143-150. and [facility] 3-tiered system for  managing ITNs: J Am Coll Radiol. 2015; Feb;12(2): 143-50 Lungs/Pleura: The airways are diffusely thickened and many towards the bases appear fluid-filled with some chronic architectural distortion and scarring in the left lower lobe, lingula and right middle lobe. Increasing consolidative opacities present in the right lung base. Additional patchy consolidation with tree-in-bud nodularity is present in the right upper lobe as well. Some mosaic attenuation may reflect further small airways disease or air trapping. Evaluation for concerning pulmonary nodules and masses limited due to extensive respiratory motion. No pneumothorax. No visible effusion. Upper Abdomen: Large hiatal hernia as above. No acute abnormalities present in the visualized portions of the upper abdomen. Musculoskeletal: Exaggerated thoracic kyphosis with increased AP diameter of the chest. Mild dextrocurvature of the thoracic spine noted as well. Multilevel degenerative changes are present in the imaged portions of the spine. No acute osseous abnormality or suspicious osseous lesion. Review of the MIP images confirms the above findings. IMPRESSION: 1. No evidence of acute central, lobar or proximal segmental pulmonary embolism. More distal evaluation may be limited by extensive respiratory motion artifact. 2. Diffusely thickened airways with many of the lung bases appearing fluid-filled likely reflecting features of aspiration, likely chronic given chronic architectural distortion and scarring throughout both lungs. More acute areas of airspace disease in the right lower lobe and upper lobe. 3. Mosaic attenuation may reflect further small airways disease or air trapping. 4. Right heart enlargement and reflux of contrast into the hepatic veins and IVC may reflect elevated right heart pressure/right heart failure. 5. Large hiatal hernia. 6. Aortic Atherosclerosis (ICD10-I70.0). Electronically Signed   By: Lovena Le M.D.   On: 07/02/2019 22:23    DG Chest Port 1 View  Result Date: 07/02/2019 CLINICAL DATA:  86 year old with cough and shortness of breath. EXAM: PORTABLE CHEST 1 VIEW COMPARISON:  Radiograph 02/09/2019.  Chest CT 01/26/2017 FINDINGS: Unchanged heart size and mediastinal contours. Retrocardiac hiatal hernia. Blunting of the left costophrenic angle appears chronic and related to scarring. Stable scarring in the perifissural right upper lobe. No evidence of acute or focal airspace disease. No pulmonary edema, pneumothorax, or large pleural effusion. Scoliotic curvature of the spine with stable osseous structures. IMPRESSION: 1. No acute abnormality.  Bilateral lung scarring. 2. Hiatal hernia. Electronically Signed   By: Keith Rake M.D.   On: 07/02/2019 19:00    Procedures Procedures (including critical care time)  Medications Ordered in ED Medications  ALPRAZolam (XANAX) tablet 0.5 mg (0.5 mg Oral Given 07/03/19 0942)  pantoprazole (PROTONIX) EC tablet 40 mg (40 mg Oral Given 07/03/19 0943)  senna (SENOKOT) tablet 8.6 mg (8.6 mg Oral Given 07/02/19 1947)  enoxaparin (LOVENOX) injection 40 mg (40 mg Subcutaneous Given 07/02/19 2218)  sodium chloride (PF) 0.9 % injection (has no administration in time range)  acetaminophen (TYLENOL) tablet 500 mg (500 mg Oral Given 07/03/19 0546)  escitalopram (LEXAPRO) tablet 5 mg (5 mg Oral Given 07/03/19 0942)  levothyroxine (SYNTHROID) tablet 50 mcg (50 mcg Oral Given  07/03/19 0545)  clopidogrel (PLAVIX) tablet 75 mg (75 mg Oral Given 07/03/19 0942)  loratadine (CLARITIN) tablet 10 mg (10 mg Oral Given 07/03/19 0943)  Netarsudil-Latanoprost 0.02-0.005 % SOLN 1 drop (has no administration in time range)  Brinzolamide-Brimonidine 1-0.2 % SUSP 1 drop (1 drop Both Eyes Given 07/03/19 0944)  methylPREDNISolone sodium succinate (SOLU-MEDROL) 40 mg/mL injection 40 mg (40 mg Intravenous Given 07/03/19 0944)  albuterol (PROVENTIL) (2.5 MG/3ML) 0.083% nebulizer solution 2.5 mg (2.5 mg Nebulization  Not Given 07/03/19 0939)  hydrALAZINE (APRESOLINE) injection 5 mg (has no administration in time range)  sodium chloride (PF) 0.9 % injection (has no administration in time range)  MEDLINE mouth rinse (has no administration in time range)  0.9 %  sodium chloride infusion ( Intravenous Rate/Dose Verify 07/03/19 0600)  feeding supplement (ENSURE ENLIVE) (ENSURE ENLIVE) liquid 237 mL (has no administration in time range)  Ampicillin-Sulbactam (UNASYN) 3 g in sodium chloride 0.9 % 100 mL IVPB (3 g Intravenous New Bag/Given 07/03/19 0947)  carvedilol (COREG) tablet 6.25 mg (6.25 mg Oral Given 07/02/19 1946)  DULoxetine (CYMBALTA) DR capsule 20 mg (20 mg Oral Given 07/02/19 1947)  cefTRIAXone (ROCEPHIN) 1 g in sodium chloride 0.9 % 100 mL IVPB (0 g Intravenous Stopped 07/02/19 2144)  azithromycin (ZITHROMAX) 500 mg in sodium chloride 0.9 % 250 mL IVPB (500 mg Intravenous New Bag/Given 07/02/19 2217)  iohexol (OMNIPAQUE) 350 MG/ML injection 100 mL (100 mLs Intravenous Contrast Given 07/02/19 2201)  Ampicillin-Sulbactam (UNASYN) 3 g in sodium chloride 0.9 % 100 mL IVPB ( Intravenous Rate/Dose Verify 07/03/19 0200)    ED Course  I have reviewed the triage vital signs and the nursing notes.  Pertinent labs & imaging results that were available during my care of the patient were reviewed by me and considered in my medical decision making (see chart for details).  Clinical Course as of Jul 02 1100  Fri Jul 02, 2019  1911 Discussed with Dr. Flossie Buffy [facility] hospitalist will evaluate the patient for admission.   [MB]    Clinical Course User Index [MB] Hayden Rasmussen, MD   MDM Rules/Calculators/A&P                         This patient complains of fever cough productive of sputum increased shortness of breath; this involves an extensive number of treatment Options and is a complaint that carries with it a high risk of complications and Morbidity. The differential includes pneumonia, COPD, CHF, ACS, pneumothorax,  Covid  I ordered, reviewed and interpreted labs, which included CBC with elevated white count question infection versus related to her chronic steroids.  Chemistries fairly unremarkable.  Troponin and lactic acid normal BNP slightly elevated question related to CHF versus her chronic pulmonary disease I ordered medication IV antibiotics and breathing treatments I ordered imaging studies which included chest x-ray and CT angio and I independently    visualized and interpreted imaging which showed chronic scarring on the chest x-ray.  CT significant bronchiectatic disease new areas of possible consolidation Additional history obtained from patient's 2 daughters Previous records obtained and reviewed in epic including notes from pulmonology [name] I consulted [facility] hospitalist Dr. Flossie Buffy and discussed lab and imaging findings  Critical Interventions: None  After the interventions stated above, I reevaluated the patient and found patient to be hemodynamically stable on 2 L nasal cannula oxygen.  Will need to be admitted for IV antibiotics and respiratory treatments.  Likely pulmonary will need to  get involved in the patient's care.  Patient and daughter are agreeable to admission.  Sarah [name] was evaluated in Emergency Department on 07/02/2019 for the symptoms described in the history of present illness. She was evaluated in the context of the global COVID-19 pandemic, which necessitated consideration that the patient might be at risk for infection with the SARS-CoV-2 virus that causes COVID-19. Institutional protocols and algorithms that pertain to the evaluation of patients at risk for COVID-19 are in a state of rapid change based on information released by regulatory bodies including the CDC and federal and state organizations. These policies and algorithms were followed during the patient's care in the ED.  Final Clinical Impression(s) / ED Diagnoses Final diagnoses:  SOB (shortness of breath)    Hypoxia  Acute respiratory failure with hypoxia [facility])    Rx / DC Orders ED Discharge Orders    None       Hayden Rasmussen, MD 07/03/19 [phone]

## 2019-07-03 DIAGNOSIS — J479 Bronchiectasis, uncomplicated: Secondary | ICD-10-CM

## 2019-07-03 DIAGNOSIS — J9601 Acute respiratory failure with hypoxia: Principal | ICD-10-CM

## 2019-07-03 LAB — BASIC METABOLIC PANEL
Anion gap: 6 (ref 5–15)
BUN: 12 mg/dL (ref 8–23)
CO2: 33 mmol/L — ABNORMAL HIGH (ref 22–32)
Calcium: 8.7 mg/dL — ABNORMAL LOW (ref 8.9–10.3)
Chloride: 97 mmol/L — ABNORMAL LOW (ref 98–111)
Creatinine, Ser: 0.72 mg/dL (ref 0.44–1.00)
GFR calc Af Amer: >60 mL/min (ref >60)
GFR calc non Af Amer: >60 mL/min (ref >60)
Glucose, Bld: 93 mg/dL (ref 70–99)
Potassium: 4.4 mmol/L (ref 3.5–5.1)
Sodium: 136 mmol/L (ref 135–145)

## 2019-07-03 LAB — CBC
HCT: 43.3 % (ref 36.0–46.0)
Hemoglobin: 13.3 g/dL (ref 12.0–15.0)
MCH: 32.4 pg (ref 26.0–34.0)
MCHC: 30.7 g/dL (ref 30.0–36.0)
MCV: 105.4 fL — ABNORMAL HIGH (ref 80.0–100.0)
Platelets: 212 10*3/uL (ref 150–400)
RBC: 4.11 MIL/uL (ref 3.87–5.11)
RDW: 12.3 % (ref 11.5–15.5)
WBC: 15.1 10*3/uL — ABNORMAL HIGH (ref 4.0–10.5)
nRBC: 0 % (ref 0.0–0.2)

## 2019-07-03 LAB — PROCALCITONIN: Procalcitonin: <0.1 ng/mL

## 2019-07-03 LAB — LACTIC ACID, PLASMA: Lactic Acid, Venous: 1.3 mmol/L (ref 0.5–1.9)

## 2019-07-03 LAB — TROPONIN I (HIGH SENSITIVITY): Troponin I (High Sensitivity): 12 ng/L (ref <18)

## 2019-07-03 MED ORDER — PREDNISONE 5 MG PO TABS
10.0000 mg | ORAL_TABLET | Freq: Every day | ORAL | Status: DC
  Administered 2019-07-04 – 2019-07-09 (×6): 10 mg via ORAL
  Filled 2019-07-03 (×6): qty 2

## 2019-07-03 MED ORDER — ALBUTEROL SULFATE (2.5 MG/3ML) 0.083% IN NEBU
2.5000 mg | INHALATION_SOLUTION | Freq: Three times a day (TID) | RESPIRATORY_TRACT | Status: DC
  Administered 2019-07-03 – 2019-07-04 (×2): 2.5 mg via RESPIRATORY_TRACT
  Filled 2019-07-03 (×2): qty 3

## 2019-07-03 MED ORDER — SODIUM CHLORIDE 0.9 % IV SOLN: INTRAVENOUS | Status: AC | PRN

## 2019-07-03 MED ORDER — CALCIUM CARBONATE ANTACID 500 MG PO CHEW: 1.0000 | CHEWABLE_TABLET | Freq: Three times a day (TID) | ORAL | Status: DC | PRN

## 2019-07-03 MED ORDER — SODIUM CHLORIDE 0.9 % IV SOLN
3.0000 g | Freq: Two times a day (BID) | INTRAVENOUS | Status: DC
  Administered 2019-07-03 – 2019-07-04 (×3): 3 g via INTRAVENOUS
  Filled 2019-07-03 (×3): qty 3

## 2019-07-03 MED ORDER — SODIUM CHLORIDE 0.9 % IV SOLN
3.0000 g | Freq: Once | INTRAVENOUS | Status: AC
  Administered 2019-07-03: 3 g via INTRAVENOUS
  Filled 2019-07-03: qty 3

## 2019-07-03 MED ORDER — CARVEDILOL 6.25 MG PO TABS
6.2500 mg | ORAL_TABLET | Freq: Two times a day (BID) | ORAL | Status: DC
  Administered 2019-07-03 – 2019-07-09 (×12): 6.25 mg via ORAL
  Filled 2019-07-03 (×12): qty 1

## 2019-07-03 MED ORDER — ENSURE ENLIVE PO LIQD: 237.0000 mL | Freq: Two times a day (BID) | ORAL | Status: DC

## 2019-07-03 MED ORDER — ENSURE ENLIVE PO LIQD
237.0000 mL | ORAL | Status: DC
  Administered 2019-07-07 – 2019-07-09 (×2): 237 mL via ORAL

## 2019-07-03 NOTE — Progress Notes (Addendum)
PROGRESS NOTE    Sarah [name]   VQM:[id number]  DOB: 09-Jun-1921  PCP: [name], MD    DOA: 07/02/2019 LOS: 1   Brief Narrative   Sarah [name] is a 86 y.o. female with medical history significant for Hx of bronchiectasis on chronic steroids and rotates azithromycin/Omnicef monthly (follows with Dr. Delton Coombes), CAD s/p NSTEMI, CVA, hypothyroidism, depression, HLD who presented to the ED on 07/02/19 with acute and progressive shortness of breath since the day before.  Associated productive cough worse than baseline, low grade fever and hypoxia.  In the ED, afebrile, mildly tachypneic, requiring 2L and hypertensive up to systolic of 180s.  Lab work notable for leukocytosis of 13.8.  Lactic acid of 1.1.  Normal creatinine of 0.79.  Chest x-ray had no acute abnormalities.  CTA chest was negative for PE but showed diffusely thickened airways with lower airways fluid-filled concerning for aspiration, and more acute areas of airspace opacities in the right upper and lower lobes.  Admitted for further evaluation and management of aspiration pneumonia vs bronchiectasis flare.    Of note, patient was hospitalized in December 2020 for Pseudomonas pneumonia and had to have left pleural effusion requiring thoracentesis and removal of 450 mL of transudative pleural fluid.   Assessment & Plan   Active Problems:   Essential hypertension   Hypothyroidism   Anxiety   Acute respiratory failure with hypoxia (HCC)   History of CVA (cerebrovascular accident)   CAD (coronary artery disease)   Glaucoma  Acute hypoxic respiratory failure secondary to presumed pneumonia vs bronchiectasis flare On chronic antibiotics for bronchiectasis.   Received IV Rocephin and azithromycin in ED.   Currently on Unasyn given chronic aspiration changes seen on CTA chest. Patient does have history of Pseudomonas pneumonia in Dec 2020.  Procalcitonin < 0.10. Follow up sputum cultures. Scheduled albuterol nebulizer  q6hr Incentive spirometry/flutter valve Speech evaluated swallow, recommends regular diet, thin liquids, whole meds with puree. D/C IV steroid, do not appreciate wheezing / bronchospasm.   Resume home dose Prednisone 10mg . Pulmonology consulted. Chest PT.  Essential Hypertension Continue home Coreg.  As needed IV hydralazine for systolic greater than 180/diastolic greater than 110. Daughter states amlodipine is used PRN at home for SBP>180 only, per patient's PCP.  Hx of CAD - Asymptomatic  History of CVA - Continue Plavix  History anxiety - Continue Lexapro and Xanax  Hypothyroidism - Continue Synthroid  Glaucoma - Continue eyedrops brought from home   Patient BMI: Body mass index is 20.42 kg/m.   DVT prophylaxis: enoxaparin (LOVENOX) injection 40 mg Start: 07/02/19 2200   Diet:  Diet Orders (From admission, onward)    Start     Ordered   07/02/19 2108  Diet Heart Room service appropriate? Yes; Fluid consistency: Thin  Diet effective now       Question Answer Comment  Room service appropriate? Yes   Fluid consistency: Thin      07/02/19 2108            Code Status: DNR    Subjective 07/03/19    Patient seen with daughter at bedside.  Patient reports feeling very little bit better.  Daughter says patient looks about the same as yesterday.  Patient denies fever, chills, chest pain or feeling short of breath at rest.  Continues with productive cough worse than baseline.  Patient falls asleep frequently during encounter but is arouses easily.   Disposition Plan & Communication   Status is: Inpatient  Remains inpatient appropriate because:Inpatient level  of care appropriate due to severity of illness   Dispo: The patient is from: Home              Anticipated d/c is to: Home              Anticipated d/c date is: 3 days              Patient currently is not medically stable to d/c.   Family Communication: daughter at bedside during encounter.  All  questions answered and she is agreeable with the plan.      Consults, Procedures, Significant Events   Consultants:   Pulmonology  Antimicrobials:   Rocephin and Zithromax in ED 6/18  Unasyn 6/18 >>   Objective   Vitals:   07/03/19 0030 07/03/19 0111 07/03/19 0301 07/03/19 0433  BP: (!) 144/78   (!) 155/84  Pulse: 88   96  Resp: (!) 21   (!) 24  Temp: 98.2 F (36.8 C)   98.3 F (36.8 C)  TempSrc: Oral   Oral  SpO2: 97%  94% 95%  Weight:  57.4 kg    Height:  5\' 6"  (1.676 m)      Intake/Output Summary (Last 24 hours) at 07/03/2019 0758 Last data filed at 07/03/2019 0600 Gross per 24 hour  Intake 685.97 ml  Output 200 ml  Net 485.97 ml   Filed Weights   07/03/19 0111  Weight: 57.4 kg    Physical Exam:  General exam: no acute distress, frail, drowsy HEENT: moist mucus membranes, hearing impaired  Respiratory system: diffuse rhonchi, shallow inspirations, mildly increased respiratory effort. Cardiovascular system: normal S1/S2, RRR, no pedal edema.   Gastrointestinal system: soft, NT, +bowel sounds. Central nervous system: A&O x4. no gross focal neurologic deficits, normal speech Extremities: moves all, no edema, normal tone Psychiatry: normal mood, congruent affect, judgement and insight appear normal  Labs   Data Reviewed: I have personally reviewed following labs and imaging studies  CBC: Recent Labs  Lab 07/02/19 1807 07/03/19 0039  WBC 15.8* 15.1*  NEUTROABS 13.3*  --   HGB 14.0 13.3  HCT 44.8 43.3  MCV 103.9* 105.4*  PLT 247 212   Basic Metabolic Panel: Recent Labs  Lab 07/02/19 1807 07/03/19 0039  NA 137 136  K 3.7 4.4  CL 96* 97*  CO2 30 33*  GLUCOSE 89 93  BUN 14 12  CREATININE 0.79 0.72  CALCIUM 8.7* 8.7*   GFR: Estimated Creatinine Clearance: 37.3 mL/min (by C-G formula based on SCr of 0.72 mg/dL). Liver Function Tests: No results for input(s): AST, ALT, ALKPHOS, BILITOT, PROT, ALBUMIN in the last 168 hours. No results for  input(s): LIPASE, AMYLASE in the last 168 hours. No results for input(s): AMMONIA in the last 168 hours. Coagulation Profile: No results for input(s): INR, PROTIME in the last 168 hours. Cardiac Enzymes: No results for input(s): CKTOTAL, CKMB, CKMBINDEX, TROPONINI in the last 168 hours. BNP (last 3 results) No results for input(s): PROBNP in the last 8760 hours. HbA1C: No results for input(s): HGBA1C in the last 72 hours. CBG: No results for input(s): GLUCAP in the last 168 hours. Lipid Profile: No results for input(s): CHOL, HDL, LDLCALC, TRIG, CHOLHDL, LDLDIRECT in the last 72 hours. Thyroid Function Tests: No results for input(s): TSH, T4TOTAL, FREET4, T3FREE, THYROIDAB in the last 72 hours. Anemia Panel: No results for input(s): VITAMINB12, FOLATE, FERRITIN, TIBC, IRON, RETICCTPCT in the last 72 hours. Sepsis Labs: Recent Labs  Lab 07/02/19 1910 07/03/19 0039  PROCALCITON  --  <0.10  LATICACIDVEN 1.1 1.3    Recent Results (from the past 240 hour(s))  Culture, blood (routine x 2)     Status: None (Preliminary result)   Collection Time: 07/02/19  6:07 PM   Specimen: BLOOD LEFT FOREARM  Result Value Ref Range Status   Specimen Description   Final    BLOOD LEFT FOREARM Performed at [facility], [address] [place], [place] [address]    Special Requests   Final    BOTTLES DRAWN AEROBIC AND ANAEROBIC Blood Culture adequate volume Performed at [facility], [address], [place], [place] [address]    Culture PENDING  Incomplete   Report Status PENDING  Incomplete  Culture, blood (routine x 2)     Status: None (Preliminary result)   Collection Time: 07/02/19  6:12 PM   Specimen: BLOOD  Result Value Ref Range Status   Specimen Description   Final    BLOOD SITE NOT SPECIFIED Performed at [facility], [address] [place], [place] [address]    Special Requests   Final    BOTTLES DRAWN AEROBIC AND ANAEROBIC Blood Culture adequate  volume Performed at [facility], [address], [place], [place] [address]    Culture PENDING  Incomplete   Report Status PENDING  Incomplete  SARS Coronavirus 2 by RT PCR (hospital order, performed in [facility] hospital lab) Nasopharyngeal Nasopharyngeal Swab     Status: None   Collection Time: 07/02/19  7:26 PM   Specimen: Nasopharyngeal Swab  Result Value Ref Range Status   SARS Coronavirus 2 NEGATIVE NEGATIVE Final    Comment: (NOTE) SARS-CoV-2 target nucleic acids are NOT DETECTED.  The SARS-CoV-2 RNA is generally detectable in upper and lower respiratory specimens during the acute phase of infection. The lowest concentration of SARS-CoV-2 viral copies this assay can detect is 250 copies / mL. A negative result does not preclude SARS-CoV-2 infection and should not be used as the sole basis for treatment or other patient management decisions.  A negative result may occur with improper specimen collection / handling, submission of specimen other than nasopharyngeal swab, presence of viral mutation(s) within the areas targeted by this assay, and inadequate number of viral copies (<250 copies / mL). A negative result must be combined with clinical observations, patient history, and epidemiological information.  Fact Sheet for Patients:   BoilerBrush.com.cy  Fact Sheet for Healthcare Providers: https://pope.com/  This test is not yet approved or  cleared by the [place] FDA and has been authorized for detection and/or diagnosis of SARS-CoV-2 by FDA under an Emergency Use Authorization (EUA).  This EUA will remain in effect (meaning this test can be used) for the duration of the COVID-19 declaration under Section 564(b)(1) of the Act, 21 U.S.C. section 360bbb-3(b)(1), unless the authorization is terminated or revoked sooner.  Performed at [facility], [address], [place],  [place] [address]       Imaging Studies   CT Angio Chest PE W/Cm &/Or Wo Cm  Result Date: 07/02/2019 CLINICAL DATA:  Shortness of breath and productive cough fever since yesterday EXAM: CT ANGIOGRAPHY CHEST WITH CONTRAST TECHNIQUE: Multidetector CT imaging of the chest was performed using the standard protocol during bolus administration of intravenous contrast. Multiplanar CT image reconstructions and MIPs were obtained to evaluate the vascular anatomy. CONTRAST:  OMNIPAQUE IOHEXOL 350 MG/ML SOLN COMPARISON:  CT 01/26/2017, radiograph 07/02/2019 FINDINGS: Cardiovascular: Sagittal opacification  of pulmonary arteries. No central, lobar or proximal segmental filling defects. More distal evaluation may be limited by extensive respiratory motion artifact which is pronounced towards the lung bases. Central pulmonary arteries are within normal limits for caliber. Punctate gas in the main pulmonary artery likely related to intravenous access. Overall, the cardiac size is within normal limits albeit with some mild right heart enlargement and reflux of contrast into the IVC and hepatic veins. Scant coronary calcium is noted. No pericardial effusion. Atherosclerotic plaque within the normal caliber aorta. Normal 3 vessel branching of the aortic arch. Minimal plaque in the proximal great vessels. No acute luminal abnormality of the thoracic aorta or proximal great vessels. No periaortic stranding or hemorrhage. Mediastinum/Nodes: Scattered low-attenuation mediastinal and hilar nodes many of which are subcentimeter, favored to be reactive. The largest node is a 13 mm precarinal lymph node however this is stable from comparison study. Few stable calcified thyroid nodules. No further imaging is warranted based on patient age. This follows consensus guidelines: Managing Incidental Thyroid Nodules Detected on Imaging: White Paper of the ACR Incidental Thyroid Findings Committee. J Am Coll Radiol 2015; 12:143-150. and [facility]  3-tiered system for managing ITNs: J Am Coll Radiol. 2015; Feb;12(2): 143-50 Lungs/Pleura: The airways are diffusely thickened and many towards the bases appear fluid-filled with some chronic architectural distortion and scarring in the left lower lobe, lingula and right middle lobe. Increasing consolidative opacities present in the right lung base. Additional patchy consolidation with tree-in-bud nodularity is present in the right upper lobe as well. Some mosaic attenuation may reflect further small airways disease or air trapping. Evaluation for concerning pulmonary nodules and masses limited due to extensive respiratory motion. No pneumothorax. No visible effusion. Upper Abdomen: Large hiatal hernia as above. No acute abnormalities present in the visualized portions of the upper abdomen. Musculoskeletal: Exaggerated thoracic kyphosis with increased AP diameter of the chest. Mild dextrocurvature of the thoracic spine noted as well. Multilevel degenerative changes are present in the imaged portions of the spine. No acute osseous abnormality or suspicious osseous lesion. Review of the MIP images confirms the above findings. IMPRESSION: 1. No evidence of acute central, lobar or proximal segmental pulmonary embolism. More distal evaluation may be limited by extensive respiratory motion artifact. 2. Diffusely thickened airways with many of the lung bases appearing fluid-filled likely reflecting features of aspiration, likely chronic given chronic architectural distortion and scarring throughout both lungs. More acute areas of airspace disease in the right lower lobe and upper lobe. 3. Mosaic attenuation may reflect further small airways disease or air trapping. 4. Right heart enlargement and reflux of contrast into the hepatic veins and IVC may reflect elevated right heart pressure/right heart failure. 5. Large hiatal hernia. 6. Aortic Atherosclerosis (ICD10-I70.0). Electronically Signed   By: Kreg Shropshire M.D.   On:  07/02/2019 22:23   DG Chest Port 1 View  Result Date: 07/02/2019 CLINICAL DATA:  86 year old with cough and shortness of breath. EXAM: PORTABLE CHEST 1 VIEW COMPARISON:  Radiograph 02/09/2019.  Chest CT 01/26/2017 FINDINGS: Unchanged heart size and mediastinal contours. Retrocardiac hiatal hernia. Blunting of the left costophrenic angle appears chronic and related to scarring. Stable scarring in the perifissural right upper lobe. No evidence of acute or focal airspace disease. No pulmonary edema, pneumothorax, or large pleural effusion. Scoliotic curvature of the spine with stable osseous structures. IMPRESSION: 1. No acute abnormality.  Bilateral lung scarring. 2. Hiatal hernia. Electronically Signed   By: Narda Rutherford M.D.   On: 07/02/2019 19:00  Medications   Scheduled Meds: . albuterol  2.5 mg Nebulization Q6H  . ALPRAZolam  0.5 mg Oral BID  . Brinzolamide-Brimonidine  1 drop Both Eyes TID  . clopidogrel  75 mg Oral Daily  . enoxaparin (LOVENOX) injection  40 mg Subcutaneous QHS  . escitalopram  5 mg Oral Daily  . feeding supplement (ENSURE ENLIVE)  237 mL Oral BID BM  . levothyroxine  50 mcg Oral Q0600  . loratadine  10 mg Oral Daily  . mouth rinse  15 mL Mouth Rinse BID  . methylPREDNISolone (SOLU-MEDROL) injection  40 mg Intravenous Daily  . Netarsudil-Latanoprost  1 drop Ophthalmic QHS  . pantoprazole  40 mg Oral BID  . sodium chloride (PF)      . sodium chloride (PF)       Continuous Infusions: . sodium chloride 10 mL/hr at 07/03/19 0600  . ampicillin-sulbactam (UNASYN) IV         LOS: 1 day    Time spent: 40 minutes with >50% spent in coordination of care and direct patient contact.    Ezekiel Slocumb, DO [facility]  07/03/2019, 7:58 AM    If 7PM-7AM, please contact night-coverage. How to contact the [facility] Attending or Consulting provider [place] or covering provider during after hours [place], for this patient?    1. Check the care team in [facility]  and look for a) attending/consulting [facility] provider listed and b) the [facility] team listed 2. Log into [email or web address] and use [place]'s universal password to access. If you do not have the password, please contact the hospital operator. 3. Locate the [name] provider you are looking for under [facility] and page to a number that you can be directly reached. 4. If you still have difficulty reaching the provider, please page the [facility] (Director on Call) for the Hospitalists listed on amion for assistance.

## 2019-07-03 NOTE — Progress Notes (Signed)
Pharmacy Antibiotic Note  Sarah [name] is a 86 y.o. female admitted on 07/02/2019 with Asp. PNA.  Pharmacy has been consulted for Unasyn dosing.  Plan: Unasyn 3gm iv q12hr  Height: 5\' 6"  (167.6 cm) Weight: 57.4 kg (126 lb 8.7 oz) IBW/kg (Calculated) : 59.3  Temp (24hrs), Avg:98.1 F (36.7 C), Min:97.7 F (36.5 C), Max:98.3 F (36.8 C)  Recent Labs  Lab 07/02/19 1807 07/02/19 1910 07/03/19 0039  WBC 15.8*  --  15.1*  CREATININE 0.79  --  0.72  LATICACIDVEN  --  1.1 1.3    Estimated Creatinine Clearance: 37.3 mL/min (by C-G formula based on SCr of 0.72 mg/dL).    Allergies  Allergen Reactions  . Codeine Anaphylaxis  . Hydrocodone Nausea And Vomiting and Other (See Comments)    SYNCOPE AND BRADYCARDIA  . Isosorbide Nitrate Other (See Comments)    BRADYCARDIA  . Oxycodone Palpitations    Rapid heart beat  . Clarithromycin Nausea And Vomiting    Has patient had a PCN reaction causing immediate rash, facial/tongue/throat swelling, SOB or lightheadedness with hypotension: Unknown Has patient had a PCN reaction causing severe rash involving mucus membranes or skin necrosis: Unknown Has patient had a PCN reaction that required hospitalization: Unknown Has patient had a PCN reaction occurring within the last 10 years: Unknown If all of the above answers are "NO", then may proceed with Cephalosporin use.   . Fiorinal [Butalbital-Aspirin-Caffeine] Swelling and Other (See Comments)    Facial swelling   . Levofloxacin Nausea And Vomiting and Other (See Comments)    SYNCOPE ALSO  . Augmentin [Amoxicillin-Pot Clavulanate] Diarrhea  . Doxycycline     Sweats and aches    Antimicrobials this admission: Unasyn 07/03/2019 >>  Dose adjustments this admission: -  Microbiology results: -  Thank you for allowing pharmacy to be a part of this patient's care.  07/05/2019 [place] 07/03/2019 5:36 AM

## 2019-07-03 NOTE — Evaluation (Addendum)
Clinical/Bedside Swallow Evaluation Patient Details  Name: Sarah [name] MRN: [id number] Date of Birth: 10/29/1921  Today's Date: 07/03/2019 Time: SLP Start Time (ACUTE ONLY): 1045 SLP Stop Time (ACUTE ONLY): 1110 SLP Time Calculation (min) (ACUTE ONLY): 25 min  Past Medical History:  Past Medical History:  Diagnosis Date  . Allergic rhinitis   . Anxiety    Anxiety attacks  . Arthritis    "a little; in my back and hands" (04/15/2012)  . Asthma   . Bronchiectasis    with history of Legionaires with chronic rales/rhonchi  . Chest pain at rest   . Daily headache    "over the last week" 04/15/2012   . Depression   . Diastolic dysfunction    a. per echo 08/2010 with normal LV function;  b. 06/2011 Echo: EF 65-70%  . GERD (gastroesophageal reflux disease)    "just recently" (04/15/2012)  . H/O hiatal hernia   . H/O Legionnaire's disease 11/1976  . History of blood transfusion 1972   "w/hysterectomy" (04/15/2012)  . Hypercholesteremia   . Hypertension    Negative renal duplex in December of 2012  . Hypothyroidism   . Idiopathic peripheral neuropathy   . MAC (mycobacterium avium-intracellulare complex)   . Migraines    "outgrew them" (04/15/2012)  . NSTEMI (non-ST elevated myocardial infarction) ([place])    a. Normal coronaries per cath August 2012 and negative CT angio for PE;  b. 06/2011 Repeat admission w/ chest pain and elevated troponin's, CTA Chest w/o PE  . Osteopenia 02/2011   t score - 2.1  . Pneumonia    "recurrent" (04/15/2012)  . Pollen allergies   . Shortness of breath    "sometimes just lying down" (04/15/2012)  . Stroke [facility]) 06/2014   cerebellum   Past Surgical History:  Past Surgical History:  Procedure Laterality Date  . APPENDECTOMY    . CARDIAC CATHETERIZATION  August 2012   Normal coronaries.  [facility] DILATION AND CURETTAGE OF UTERUS    . TONSILLECTOMY  1930  . TOTAL ABDOMINAL HYSTERECTOMY W/ BILATERAL SALPINGOOPHORECTOMY  1972   leiomyomata, menorrhagia   HPI:  86  y.o. female with medical history significant for Hx of bronchiectasis on chronic steroids and rotates azithromycin/Omnicef monthly, CAD s/p NSTEMI, CVA, hypothyroidism, depression, HLD who presents with increasing shortness of breath since yesterday ; CT chest on 07/02/19 indicated Diffusely thickened airways with many of the lung bases appearing fluid-filled likely reflecting features of aspiration, likely chronic given chronic architectural distortion and scarring throughout both lungs. More acute areas of airspace disease in the right lower lobe and upper lobe   Assessment / Plan / Recommendation Clinical Impression  Pt presents with primarily pharyngoesophageal symptoms including globus sensation with solids; oral holding/delayed cough with larger volumes of thin liquids; delayed cough and multiple swallows with puree and solids; liquid wash assisted with delayed cough indicating potential pharyngeal residue present during PO consumption; pt c/o globus sensation with pills and certain solid foods; daughter stated pt "coughs all of the time" d/t bronchiectasis/ COPD dx; pt is independently utilizing slow rate and small bites/sips at baseline d/t respiratory status/hx; recommend effortful swallow/liquid wash prn as further precautions during PO intake; ST will f/u for diet tolerance/aspiration/ swallowing precautions while in acute setting; MBS recommended to assess swallow function and r/o aspiration related to swallowing/breathing reciprocity.  Outpatient MBS would be beneficial.  Thank you for this consult. SLP Visit Diagnosis: Dysphagia, pharyngoesophageal phase (R13.14)    Aspiration Risk  Mild aspiration risk;Moderate aspiration risk  Diet Recommendation   Regular/thin liquids  Medication Administration: Whole meds with puree    Other  Recommendations Oral Care Recommendations: Oral care BID   Follow up Recommendations Other (comment) (MBS)      Frequency and Duration min 2x/week  1  week       Prognosis Prognosis for Safe Diet Advancement: Good      Swallow Study   General Date of Onset: 07/02/19 HPI: 85 y.o. female with medical history significant for Hx of bronchiectasis on chronic steroids and rotates azithromycin/Omnicef monthly, CAD s/p NSTEMI, CVA, hypothyroidism, depression, HLD who presents with increasing shortness of breath since yesterday Type of Study: Bedside Swallow Evaluation Previous Swallow Assessment:  (n/a) Diet Prior to this Study: Regular;Thin liquids Temperature Spikes Noted: No Respiratory Status: Nasal cannula History of Recent Intubation: No Behavior/Cognition: Alert;Cooperative;Requires cueing Oral Cavity Assessment: Within Functional Limits Oral Care Completed by SLP: Recent completion by staff Oral Cavity - Dentition: Adequate natural dentition Vision: Functional for self-feeding Self-Feeding Abilities: Able to feed self;Needs assist Patient Positioning: Upright in bed Baseline Vocal Quality: Low vocal intensity;Breathy Volitional Cough: Strong Volitional Swallow: Able to elicit    Oral/Motor/Sensory Function Overall Oral Motor/Sensory Function: Within functional limits   Ice Chips Ice chips: Not tested   Thin Liquid Thin Liquid: Impaired Presentation: Cup;Straw Oral Phase Functional Implications: Oral holding Pharyngeal  Phase Impairments: Suspected delayed Swallow;Multiple swallows;Cough - Delayed Other Comments:  (Delayed cough with larger volume)    Nectar Thick Nectar Thick Liquid: Not tested   Honey Thick Honey Thick Liquid: Not tested   Puree Puree: Impaired Presentation: Self Fed Pharyngeal Phase Impairments: Suspected delayed Swallow;Multiple swallows;Cough - Delayed   Solid     Solid: Impaired Presentation: Spoon Pharyngeal Phase Impairments: Suspected delayed Swallow;Multiple swallows (liquid wash)      Elvina Sidle, M.S., CCC-SLP 07/03/2019,11:51 AM

## 2019-07-03 NOTE — Consult Note (Signed)
_0 @  Sarah [name], female    DOB: [birth year],    MRN: [id number]   Brief patient profile:  45 yowf never smoker followed by Dr  Lamonte Sakai in office with dx of bronchiectasis / pseudomonas colonization on rotating abx (omnicef/ zmax)  and prednisone 10 mg daily plus vest rx admit 6/18 with cough prod green sputum and fever to 100 and pccm service asked to eval by daughter who previously worked at [employer] as RT    ST eval   History of Present Illness  07/02/2019  Initial  eval/Sarah [name]  Chief Complaint  Patient presents with  . Shortness of Breath  Dyspnea:  Better today than yesterday / no problem at rest  Cough: somewhat congested sounding with marginal mechanics / green mucus reported with subjective wheeze x several days prior to admit  eval by ST already in am 6/19:  MBS recommended to assess swallow function and r/o aspiration related to swallowing/breathing reciprocity   No obvious patterns in  day to day or daytime variability or assoc mucus plugs or hemoptysis or cp or chest tightness, subjective wheeze or overt sinus or hb symptoms.    Also denies any obvious fluctuation of symptoms with weather or environmental changes or other aggravating or alleviating factors except as outlined above   No unusual exposure hx or h/o childhood pna/ asthma or knowledge of premature birth.  Current Allergies, Complete Past Medical History, Past Surgical History, Family History, and Social History were reviewed in [employer] record.  ROS  The following are not active complaints unless bolded Hoarseness, sore throat, dysphagia, dental problems, itching, sneezing,  nasal congestion or discharge of excess mucus or purulent secretions, ear ache,   fever, chills, sweats, unintended wt loss or wt gain, classically pleuritic or exertional cp,  orthopnea pnd or arm/hand swelling  or leg swelling, presyncope, palpitations, abdominal pain, anorexia, nausea, vomiting, diarrhea  or change in  bowel habits or change in bladder habits, change in stools or change in urine, dysuria, hematuria,  rash, arthralgias, visual complaints, headache, numbness, weakness or ataxia or problems with walking or coordination,  change in mood or  memory.               Past Medical History:  Diagnosis Date  . Allergic rhinitis   . Anxiety    Anxiety attacks  . Arthritis    "a little; in my back and hands" (04/15/2012)  . Asthma   . Bronchiectasis    with history of Legionaires with chronic rales/rhonchi  . Chest pain at rest   . Daily headache    "over the last week" 04/15/2012   . Depression   . Diastolic dysfunction    a. per echo 08/2010 with normal LV function;  b. 06/2011 Echo: EF 65-70%  . GERD (gastroesophageal reflux disease)    "just recently" (04/15/2012)  . H/O hiatal hernia   . H/O Legionnaire's disease 11/1976  . History of blood transfusion 1972   "w/hysterectomy" (04/15/2012)  . Hypercholesteremia   . Hypertension    Negative renal duplex in December of 2012  . Hypothyroidism   . Idiopathic peripheral neuropathy   . MAC (mycobacterium avium-intracellulare complex)   . Migraines    "outgrew them" (04/15/2012)  . NSTEMI (non-ST elevated myocardial infarction) ([place])    a. Normal coronaries per cath August 2012 and negative CT angio for PE;  b. 06/2011 Repeat admission w/ chest pain and elevated troponin's, CTA Chest w/o PE  . Osteopenia 02/2011  t score - 2.1  . Pneumonia    "recurrent" (04/15/2012)  . Pollen allergies   . Shortness of breath    "sometimes just lying down" (04/15/2012)  . Stroke ([place]) 06/2014   cerebellum    (Not in an outpatient encounter)  Scheduled Meds: . albuterol  2.5 mg Nebulization Q6H  . ALPRAZolam  0.5 mg Oral BID  . Brinzolamide-Brimonidine  1 drop Both Eyes TID  . carvedilol  6.25 mg Oral BID WC  . clopidogrel  75 mg Oral Daily  . enoxaparin (LOVENOX) injection  40 mg Subcutaneous QHS  . escitalopram  5 mg Oral Daily  . [START ON 07/04/2019]  feeding supplement (ENSURE ENLIVE)  237 mL Oral Q24H  . levothyroxine  50 mcg Oral Q0600  . loratadine  10 mg Oral Daily  . mouth rinse  15 mL Mouth Rinse BID  . Netarsudil-Latanoprost  1 drop Ophthalmic QHS  . pantoprazole  40 mg Oral BID  . [START ON 07/04/2019] predniSONE  10 mg Oral Q breakfast   Continuous Infusions: . sodium chloride 10 mL/hr at 07/03/19 0600  . ampicillin-sulbactam (UNASYN) IV 3 g (07/03/19 0947)   PRN Meds:.sodium chloride, acetaminophen, calcium carbonate, hydrALAZINE, senna   Objective:     BP 127/63 (BP Location: Right Arm)   Pulse 88   Temp 99.1 F (37.3 C) (Oral)   Resp 18   Ht _0  (1.676 m)   Wt 57.4 kg   LMP 01/14/1970   SpO2 96%   BMI 20.42 kg/m   SpO2: 96 % O2 Flow Rate (L/min): 2 L/min   Elderly wf very HOH but mentally sharp Tmax  Pt alert, approp nad @ 45 degrees  On 2lpm  No jvd Oropharynx clear  Neck supple Lungs with a few scattered exp > insp rhonchi bilaterally RRR no s3 or or sign murmur Abd soft with nl excursion  Extr warm with no edema or clubbing noted Neuro  Sensorium intact,  no apparent motor deficits    I personally reviewed images and agree with radiology impression as follows:   Chest CT  07/02/19  1. No evidence of acute central, lobar or proximal segmental pulmonary embolism. More distal evaluation may be limited by extensive respiratory motion artifact. 2. Diffusely thickened airways with many of the lung bases appearing fluid-filled likely reflecting features of aspiration, likely chronic given chronic architectural distortion and scarring throughout both lungs. More acute areas of airspace disease in the right lower lobe and upper lobe. 3. Mosaic attenuation may reflect further small airways disease or air trapping. 4. Right heart enlargement and reflux of contrast into the hepatic veins and IVC may reflect elevated right heart pressure/right heart failure. 5. Large hiatal hernia.  Labs  reviewed:    Lab Results  Component Value Date   WBC 15.1 (H) 07/03/2019   HGB 13.3 07/03/2019   HCT 43.3 07/03/2019   MCV 105.4 (H) 07/03/2019   PLT 212 07/03/2019          PCT  6/19    < 0.10        Assessment    1) acute flare of bronchiectasis in setting of ? Aspiration but no apparent bacterial pna or assoc parapneumonic effusion as there as been in the past - probably needs MBS at some point in future as rec by ST  - Large HH also risk for asp not directly related to eating - Unasyn reasonable for now though would have low threshold to  change to  zosyn due to h/o pseudomonas if spikes thru unasyn or clinically deteriorates on it  - continue nebs/flutter valve / very low dose steroids   2) Acute 02 dep resp failure secondary to #1 with no increased wob today and good prognosis to return to independent living s need for 02 by d/c.  DNR appropriate here.  I have discussed the case with her [facility] doctor and daughter and have nothing different to add to her care and will see again in am 6/20      Christinia Gully, MD Pulmonary and [place] [phone]  After 7:00 pm call Elink  [phone]

## 2019-07-03 NOTE — Progress Notes (Signed)
Initial Nutrition Assessment  RD working remotely.  DOCUMENTATION CODES:   Not applicable  INTERVENTION:  Agree with liberalization of diet to regular.  Provide Ensure Enlive po once daily, each supplement provides 350 kcal and 20 grams of protein. Patient prefers chocolate.  NUTRITION DIAGNOSIS:   Increased nutrient needs related to catabolic illness (bronchiectasis) as evidenced by estimated needs.  GOAL:   Patient will meet greater than or equal to 90% of their needs  MONITOR:   PO intake, Supplement acceptance, Labs, Weight trends, I & O's  REASON FOR ASSESSMENT:   Malnutrition Screening Tool    ASSESSMENT:   86 year old female with PMHx of bronchiectasis on chronic steroids and rotates azithromycin/Omnicef monthly, HTN, diastolic dysfunction, idiopathic peripheral neuropathy, hypothyroidism, anxiety, depression, hx hiatal hernia, hx NSTEMI, asthma, GERD, arthritis, hx CVA 2016 admitted with acute hypoxic respiratory failure secondary to presumed PNA vs bronchiectasis flare.   -Patient was assessed by SLP today and approved for regular texture diet with thin liquids.  Called into patient's room and spoke with her daughter over the phone. Patient was eating lunch at time of RD call. Daughter reports patient has a good appetite that is unchanged from baseline. She lives at independent living facility. She has a smaller breakfast such as cereal or oatmeal. Her lunch and dinner are full meals in the dining room at her independent living facility. Per chart patient ate 100% of her breakfast this morning. Daughter reports patient did lose weight after having PNA in December. She gained back weight by drinking chocolate Ensure and she enjoys these. Per daughter patient has one per day. Daughter reports diet was liberalized to regular today from heart healthy.  Per chart patient was 63.5 kg on 12/16/2018. She lost down to 53.1 kg on 02/05/2019. That is a weight loss of 10.4 kg (16.4%  body weight) over one month, which is significant for time frame. However, patient was 56.6 kg on 11/18/2018, so unsure how accurate weight on 12/2 was. Patient is now up to 57.4 kg (126.54 lbs).  Medications reviewed and include: levothyroxine, Protonix, prednisone 10 mg daily, Unasyn.  Labs reviewed: Chloride 97, CO2 33.  Unable to determine if patient meets criteria for malnutrition at this time.  NUTRITION - FOCUSED PHYSICAL EXAM:  Unable to complete at this time as RD is working remotely.  Diet Order:   Diet Order            Diet regular Room service appropriate? Yes; Fluid consistency: Thin  Diet effective now                EDUCATION NEEDS:   No education needs have been identified at this time  Skin:  Skin Assessment: Reviewed RN Assessment  Last BM:  07/02/2019  Height:   Ht Readings from Last 1 Encounters:  07/03/19 5\' 6"  (1.676 m)   Weight:   Wt Readings from Last 1 Encounters:  07/03/19 57.4 kg   Ideal Body Weight:  59.1 kg  BMI:  Body mass index is 20.42 kg/m.  Estimated Nutritional Needs:   Kcal:  1500-1700  Protein:  75-85 grams  Fluid:  1.5 L/day  07/05/19, MS, RD, LDN Pager number available on Amion

## 2019-07-03 NOTE — Plan of Care (Signed)
Plan of care discussed.   

## 2019-07-04 LAB — CBC WITH DIFFERENTIAL/PLATELET
Abs Immature Granulocytes: 0.06 10*3/uL (ref 0.00–0.07)
Basophils Absolute: 0 10*3/uL (ref 0.0–0.1)
Basophils Relative: 0 %
Eosinophils Absolute: 0 10*3/uL (ref 0.0–0.5)
Eosinophils Relative: 0 %
HCT: 39.3 % (ref 36.0–46.0)
Hemoglobin: 12.3 g/dL (ref 12.0–15.0)
Immature Granulocytes: 0 %
Lymphocytes Relative: 3 %
Lymphs Abs: 0.5 10*3/uL — ABNORMAL LOW (ref 0.7–4.0)
MCH: 32.7 pg (ref 26.0–34.0)
MCHC: 31.3 g/dL (ref 30.0–36.0)
MCV: 104.5 fL — ABNORMAL HIGH (ref 80.0–100.0)
Monocytes Absolute: 0.7 10*3/uL (ref 0.1–1.0)
Monocytes Relative: 5 %
Neutro Abs: 13.4 10*3/uL — ABNORMAL HIGH (ref 1.7–7.7)
Neutrophils Relative %: 92 %
Platelets: 239 10*3/uL (ref 150–400)
RBC: 3.76 MIL/uL — ABNORMAL LOW (ref 3.87–5.11)
RDW: 12.2 % (ref 11.5–15.5)
WBC: 14.7 10*3/uL — ABNORMAL HIGH (ref 4.0–10.5)
nRBC: 0 % (ref 0.0–0.2)

## 2019-07-04 LAB — BASIC METABOLIC PANEL
Anion gap: 8 (ref 5–15)
BUN: 18 mg/dL (ref 8–23)
CO2: 26 mmol/L (ref 22–32)
Calcium: 8.2 mg/dL — ABNORMAL LOW (ref 8.9–10.3)
Chloride: 100 mmol/L (ref 98–111)
Creatinine, Ser: 0.78 mg/dL (ref 0.44–1.00)
GFR calc Af Amer: >60 mL/min (ref >60)
GFR calc non Af Amer: >60 mL/min (ref >60)
Glucose, Bld: 125 mg/dL — ABNORMAL HIGH (ref 70–99)
Potassium: 3.7 mmol/L (ref 3.5–5.1)
Sodium: 134 mmol/L — ABNORMAL LOW (ref 135–145)

## 2019-07-04 LAB — MAGNESIUM: Magnesium: 2.1 mg/dL (ref 1.7–2.4)

## 2019-07-04 MED ORDER — ALBUTEROL SULFATE (2.5 MG/3ML) 0.083% IN NEBU: 2.5000 mg | INHALATION_SOLUTION | Freq: Every day | RESPIRATORY_TRACT | Status: DC

## 2019-07-04 MED ORDER — ALBUTEROL SULFATE (2.5 MG/3ML) 0.083% IN NEBU
2.5000 mg | INHALATION_SOLUTION | Freq: Three times a day (TID) | RESPIRATORY_TRACT | Status: DC
  Administered 2019-07-04 – 2019-07-05 (×4): 2.5 mg via RESPIRATORY_TRACT
  Filled 2019-07-04 (×4): qty 3

## 2019-07-04 MED ORDER — SODIUM CHLORIDE 0.9 % IV SOLN
3.0000 g | Freq: Three times a day (TID) | INTRAVENOUS | Status: DC
  Administered 2019-07-04 – 2019-07-08 (×12): 3 g via INTRAVENOUS
  Filled 2019-07-04 (×3): qty 3
  Filled 2019-07-04: qty 8
  Filled 2019-07-04: qty 3
  Filled 2019-07-04 (×2): qty 8
  Filled 2019-07-04: qty 3
  Filled 2019-07-04: qty 8
  Filled 2019-07-04: qty 3
  Filled 2019-07-04: qty 8
  Filled 2019-07-04 (×2): qty 3

## 2019-07-04 NOTE — Progress Notes (Signed)
Pharmacy Antibiotic Note  Sarah [name] is a 86 y.o. female admitted on 07/02/2019 with Asp. PNA.  Pharmacy has been consulted for Unasyn dosing.  Plan: Will adjust Unasyn dose to 3 gr IV q8h  Monitor clinical course, renal function, cultures as available   Height: 5\' 6"  (167.6 cm) Weight: 57.4 kg (126 lb 8.7 oz) IBW/kg (Calculated) : 59.3  Temp (24hrs), Avg:98.2 F (36.8 C), Min:98.1 F (36.7 C), Max:98.3 F (36.8 C)  Recent Labs  Lab 07/02/19 1807 07/02/19 1910 07/03/19 0039 07/04/19 0709  WBC 15.8*  --  15.1*  --   CREATININE 0.79  --  0.72 0.78  LATICACIDVEN  --  1.1 1.3  --     Estimated Creatinine Clearance: 37.3 mL/min (by C-G formula based on SCr of 0.78 mg/dL).    Allergies  Allergen Reactions  . Codeine Anaphylaxis  . Hydrocodone Nausea And Vomiting and Other (See Comments)    SYNCOPE AND BRADYCARDIA  . Isosorbide Nitrate Other (See Comments)    BRADYCARDIA  . Oxycodone Palpitations    Rapid heart beat  . Clarithromycin Nausea And Vomiting    Has patient had a PCN reaction causing immediate rash, facial/tongue/throat swelling, SOB or lightheadedness with hypotension: Unknown Has patient had a PCN reaction causing severe rash involving mucus membranes or skin necrosis: Unknown Has patient had a PCN reaction that required hospitalization: Unknown Has patient had a PCN reaction occurring within the last 10 years: Unknown If all of the above answers are "NO", then may proceed with Cephalosporin use.   . Fiorinal [Butalbital-Aspirin-Caffeine] Swelling and Other (See Comments)    Facial swelling   . Levofloxacin Nausea And Vomiting and Other (See Comments)    SYNCOPE ALSO  . Augmentin [Amoxicillin-Pot Clavulanate] Diarrhea  . Doxycycline     Sweats and aches     -  Thank you for allowing pharmacy to be a part of this patient's care.  07/06/19, PharmD, BCPS 07/04/2019 1:03 PM

## 2019-07-04 NOTE — Progress Notes (Signed)
PROGRESS NOTE    Lakyra Tippins   HOZ:[id number]  DOB: [birth year]  PCP: [name], MD    DOA: 07/02/2019 LOS: 2   Brief Narrative   Sarah [name] is a 86 y.o. female with medical history significant for Hx of bronchiectasis on chronic steroids and rotates azithromycin/Omnicef monthly (follows with [name]), CAD s/p NSTEMI, CVA, hypothyroidism, depression, HLD who presented to the ED on 07/02/19 with acute and progressive shortness of breath since the day before.  Associated productive cough worse than baseline, low grade fever and hypoxia.  In the ED, afebrile, mildly tachypneic, requiring 2L and hypertensive up to systolic of 704U.  Lab work notable for leukocytosis of 13.8.  Lactic acid of 1.1.  Normal creatinine of 0.79.  Chest x-ray had no acute abnormalities.  CTA chest was negative for PE but showed diffusely thickened airways with lower airways fluid-filled concerning for aspiration, and more acute areas of airspace opacities in the right upper and lower lobes.  Admitted for further evaluation and management of aspiration pneumonia vs bronchiectasis flare.    Of note, patient was hospitalized in December 2020 for Pseudomonas pneumonia and had to have left pleural effusion requiring thoracentesis and removal of 450 mL of transudative pleural fluid.   Assessment & Plan   Active Problems:   Essential hypertension   BRONCHIECTASIS   Hypothyroidism   Anxiety   Acute respiratory failure with hypoxia (HCC)   History of CVA (cerebrovascular accident)   CAD (coronary artery disease)   Glaucoma   Acute hypoxic respiratory failure secondary to aspiration pneumonia vs bronchiectasis flare - Improving, weaned to 1 L/min this AM with O2 sat 92%. On chronic antibiotics for bronchiectasis outpatient.   Received IV Rocephin and azithromycin in ED.  Procalcitonin < 0.10.  Patient does have history of Pseudomonas pneumonia in Dec 2020.   Improving on Unasyn - continue. (would switch to  Zosyn if not improving) Follow up sputum cultures. Scheduled albuterol nebulizer q6hr Incentive spirometry/flutter valve. Chest PT. Speech evaluated swallow, recommends regular diet, thin liquids, whole meds with puree.  Continue home dose Prednisone 10mg . Pulmonology consulted, their input is appreciated.  PT consulted.  Essential Hypertension Continue home Coreg.  As needed IV hydralazine for systolic greater than 889/[id number] greater than 828. Daughter states amlodipine is used PRN at home for SBP>180 only, per patient's PCP.  Hx of CAD - Asymptomatic  History of CVA - Continue Plavix  History anxiety - Continue Lexapro and Xanax  Hypothyroidism - Continue Synthroid  Glaucoma - Continue eyedrops brought from home   Patient BMI: Body mass index is 20.42 kg/m.   DVT prophylaxis: enoxaparin (LOVENOX) injection 40 mg Start: 07/02/19 2200   Diet:  Diet Orders (From admission, onward)    Start     Ordered   07/03/19 1152  Diet regular Room service appropriate? Yes; Fluid consistency: Thin  Diet effective now       Question Answer Comment  Room service appropriate? Yes   Fluid consistency: Thin      07/03/19 1151            Code Status: DNR    Subjective 07/04/19    Patient seen with daughter at bedside.  Patient reports feeling much better today.  Denies shortness of breath, says cough improved and about at baseline.  She does worry that she is weak, but says she is much stronger than on admission.  This AM, was up standing at the sink and tolerated well.  Denies fevers or  chills.    Disposition Plan & Communication   Status is: Inpatient  Remains inpatient appropriate because:Inpatient level of care appropriate due to severity of illness.  Patient continues to require supplemental oxygen and IV antibiotics as above.   Dispo: The patient is from: Home              Anticipated d/c is to: Home (PT eval pending)              Anticipated d/c date is: 1-2  days              Patient currently is not medically stable to d/c.   Family Communication: daughter at bedside during encounter.  All questions answered and she is agreeable with the plan.      Consults, Procedures, Significant Events   Consultants:   Pulmonology  Antimicrobials:   Rocephin and Zithromax in ED 6/18  Unasyn 6/18 >>   Objective   Vitals:   07/03/19 2048 07/03/19 2109 07/04/19 0537 07/04/19 0838  BP:  127/80 (!) 154/84   Pulse:  80 87 89  Resp:  16 18 18   Temp:  98.3 F (36.8 C) 98.1 F (36.7 C)   TempSrc:  Oral Oral   SpO2: 96% 95% 97% 96%  Weight:      Height:        Intake/Output Summary (Last 24 hours) at 07/04/2019 0852 Last data filed at 07/04/2019 0600 Gross per 24 hour  Intake 943.05 ml  Output --  Net 943.05 ml   Filed Weights   07/03/19 0111  Weight: 57.4 kg    Physical Exam:  General exam: no acute distress, frail, awake and alert HEENT: moist mucus membranes, hearing impaired  Respiratory system: diffuse rhonchi but improved since yesterday, inspirations not as shallow today, normal respiratory effort. Cardiovascular system: normal S1/S2, RRR, no pedal edema.   Gastrointestinal system: soft, NT, +bowel sounds. Central nervous system: A&O x4. no gross focal neurologic deficits, normal speech Extremities: moves all, no edema, normal tone Psychiatry: normal mood, congruent affect, judgement and insight appear normal  Labs   Data Reviewed: I have personally reviewed following labs and imaging studies  CBC: Recent Labs  Lab 07/02/19 1807 07/03/19 0039  WBC 15.8* 15.1*  NEUTROABS 13.3*  --   HGB 14.0 13.3  HCT 44.8 43.3  MCV 103.9* 105.4*  PLT 247 212   Basic Metabolic Panel: Recent Labs  Lab 07/02/19 1807 07/03/19 0039 07/04/19 0709  NA 137 136 134*  K 3.7 4.4 3.7  CL 96* 97* 100  CO2 30 33* 26  GLUCOSE 89 93 125*  BUN 14 12 18   CREATININE 0.79 0.72 0.78  CALCIUM 8.7* 8.7* 8.2*  MG  --   --  2.1    GFR: Estimated Creatinine Clearance: 37.3 mL/min (by C-G formula based on SCr of 0.78 mg/dL). Liver Function Tests: No results for input(s): AST, ALT, ALKPHOS, BILITOT, PROT, ALBUMIN in the last 168 hours. No results for input(s): LIPASE, AMYLASE in the last 168 hours. No results for input(s): AMMONIA in the last 168 hours. Coagulation Profile: No results for input(s): INR, PROTIME in the last 168 hours. Cardiac Enzymes: No results for input(s): CKTOTAL, CKMB, CKMBINDEX, TROPONINI in the last 168 hours. BNP (last 3 results) No results for input(s): PROBNP in the last 8760 hours. HbA1C: No results for input(s): HGBA1C in the last 72 hours. CBG: No results for input(s): GLUCAP in the last 168 hours. Lipid Profile: No results for input(s): CHOL, HDL,  LDLCALC, TRIG, CHOLHDL, LDLDIRECT in the last 72 hours. Thyroid Function Tests: No results for input(s): TSH, T4TOTAL, FREET4, T3FREE, THYROIDAB in the last 72 hours. Anemia Panel: No results for input(s): VITAMINB12, FOLATE, FERRITIN, TIBC, IRON, RETICCTPCT in the last 72 hours. Sepsis Labs: Recent Labs  Lab 07/02/19 1910 07/03/19 0039  PROCALCITON  --  <0.10  LATICACIDVEN 1.1 1.3    Recent Results (from the past 240 hour(s))  Culture, blood (routine x 2)     Status: None (Preliminary result)   Collection Time: 07/02/19  6:07 PM   Specimen: BLOOD LEFT FOREARM  Result Value Ref Range Status   Specimen Description   Final    BLOOD LEFT FOREARM Performed at [facility], [address], [place], [place] [address]    Special Requests   Final    BOTTLES DRAWN AEROBIC AND ANAEROBIC Blood Culture adequate volume Performed at [facility], [address] [place], [place] [address]    Culture   Final    NO GROWTH 2 DAYS Performed at [facility], [address] [place], [place] [address]    Report Status PENDING  Incomplete  Culture, blood (routine x 2)     Status: None (Preliminary result)    Collection Time: 07/02/19  6:12 PM   Specimen: BLOOD  Result Value Ref Range Status   Specimen Description   Final    BLOOD SITE NOT SPECIFIED Performed at [facility], [address], [place], [place] [address]    Special Requests   Final    BOTTLES DRAWN AEROBIC AND ANAEROBIC Blood Culture adequate volume Performed at [facility], [address], [place], [place] [address]    Culture   Final    NO GROWTH 2 DAYS Performed at [facility], [address], [place], [place] [address]    Report Status PENDING  Incomplete  SARS Coronavirus 2 by RT PCR (hospital order, performed in [facility] hospital lab) Nasopharyngeal Nasopharyngeal Swab     Status: None   Collection Time: 07/02/19  7:26 PM   Specimen: Nasopharyngeal Swab  Result Value Ref Range Status   SARS Coronavirus 2 NEGATIVE NEGATIVE Final    Comment: (NOTE) SARS-CoV-2 target nucleic acids are NOT DETECTED.  The SARS-CoV-2 RNA is generally detectable in upper and lower respiratory specimens during the acute phase of infection. The lowest concentration of SARS-CoV-2 viral copies this assay can detect is 250 copies / mL. A negative result does not preclude SARS-CoV-2 infection and should not be used as the sole basis for treatment or other patient management decisions.  A negative result may occur with improper specimen collection / handling, submission of specimen other than nasopharyngeal swab, presence of viral mutation(s) within the areas targeted by this assay, and inadequate number of viral copies (<250 copies / mL). A negative result must be combined with clinical observations, patient history, and epidemiological information.  Fact Sheet for Patients:   BoilerBrush.com.cy  Fact Sheet for Healthcare Providers: https://pope.com/  This test is not yet approved or  cleared by the [place] FDA and has been authorized for detection  and/or diagnosis of SARS-CoV-2 by FDA under an Emergency Use Authorization (EUA).  This EUA will remain in effect (meaning this test can be used) for the duration of the COVID-19 declaration under Section 564(b)(1) of the Act, 21 U.S.C. section 360bbb-3(b)(1), unless the authorization is terminated or revoked sooner.  Performed at [facility], [address] [place], [place] [id number]       Imaging Studies   CT Angio Chest PE W/Cm &/Or Wo Cm  Result Date: 07/02/2019 CLINICAL DATA:  Shortness of breath and productive cough fever since yesterday EXAM: CT ANGIOGRAPHY CHEST WITH CONTRAST TECHNIQUE: Multidetector CT imaging of the chest was performed using the standard protocol during bolus administration of intravenous contrast. Multiplanar CT image reconstructions and MIPs were obtained to evaluate the vascular anatomy. CONTRAST:  100mL OMNIPAQUE IOHEXOL 350 MG/ML SOLN COMPARISON:  CT 01/26/2017, radiograph 07/02/2019 FINDINGS: Cardiovascular: Sagittal opacification of pulmonary arteries. No central, lobar or proximal segmental filling defects. More distal evaluation may be limited by extensive respiratory motion artifact which is pronounced towards the lung bases. Central pulmonary arteries are within normal limits for caliber. Punctate gas in the main pulmonary artery likely related to intravenous access. Overall, the cardiac size is within normal limits albeit with some mild right heart enlargement and reflux of contrast into the IVC and hepatic veins. Scant coronary calcium is noted. No pericardial effusion. Atherosclerotic plaque within the normal caliber aorta. Normal 3 vessel branching of the aortic arch. Minimal plaque in the proximal great vessels. No acute luminal abnormality of the thoracic aorta or proximal great vessels. No periaortic stranding or hemorrhage. Mediastinum/Nodes: Scattered low-attenuation mediastinal and hilar nodes many of which are subcentimeter,  favored to be reactive. The largest node is a 13 mm precarinal lymph node however this is stable from comparison study. Few stable calcified thyroid nodules. No further imaging is warranted based on patient age. This follows consensus guidelines: Managing Incidental Thyroid Nodules Detected on Imaging: White Paper of the ACR Incidental Thyroid Findings Committee. J Am Coll Radiol 2015; 12:143-150. and [facility] 3-tiered system for managing ITNs: J Am Coll Radiol. 2015; Feb;12(2): 143-50 Lungs/Pleura: The airways are diffusely thickened and many towards the bases appear fluid-filled with some chronic architectural distortion and scarring in the left lower lobe, lingula and right middle lobe. Increasing consolidative opacities present in the right lung base. Additional patchy consolidation with tree-in-bud nodularity is present in the right upper lobe as well. Some mosaic attenuation may reflect further small airways disease or air trapping. Evaluation for concerning pulmonary nodules and masses limited due to extensive respiratory motion. No pneumothorax. No visible effusion. Upper Abdomen: Large hiatal hernia as above. No acute abnormalities present in the visualized portions of the upper abdomen. Musculoskeletal: Exaggerated thoracic kyphosis with increased AP diameter of the chest. Mild dextrocurvature of the thoracic spine noted as well. Multilevel degenerative changes are present in the imaged portions of the spine. No acute osseous abnormality or suspicious osseous lesion. Review of the MIP images confirms the above findings. IMPRESSION: 1. No evidence of acute central, lobar or proximal segmental pulmonary embolism. More distal evaluation may be limited by extensive respiratory motion artifact. 2. Diffusely thickened airways with many of the lung bases appearing fluid-filled likely reflecting features of aspiration, likely chronic given chronic architectural distortion and scarring throughout both lungs. More acute  areas of airspace disease in the right lower lobe and upper lobe. 3. Mosaic attenuation may reflect further small airways disease or air trapping. 4. Right heart enlargement and reflux of contrast into the hepatic veins and IVC may reflect elevated right heart pressure/right heart failure. 5. Large hiatal hernia. 6. Aortic Atherosclerosis (ICD10-I70.0). Electronically Signed   By: Kreg ShropshirePrice  DeHay M.D.   On: 07/02/2019 22:23   DG Chest Port 1 View  Result Date: 07/02/2019 CLINICAL DATA:  86 year old with cough and shortness  of breath. EXAM: PORTABLE CHEST 1 VIEW COMPARISON:  Radiograph 02/09/2019.  Chest CT 01/26/2017 FINDINGS: Unchanged heart size and mediastinal contours. Retrocardiac hiatal hernia. Blunting of the left costophrenic angle appears chronic and related to scarring. Stable scarring in the perifissural right upper lobe. No evidence of acute or focal airspace disease. No pulmonary edema, pneumothorax, or large pleural effusion. Scoliotic curvature of the spine with stable osseous structures. IMPRESSION: 1. No acute abnormality.  Bilateral lung scarring. 2. Hiatal hernia. Electronically Signed   By: Narda Rutherford M.D.   On: 07/02/2019 19:00     Medications   Scheduled Meds: . albuterol  2.5 mg Nebulization TID  . ALPRAZolam  0.5 mg Oral BID  . Brinzolamide-Brimonidine  1 drop Both Eyes TID  . carvedilol  6.25 mg Oral BID WC  . clopidogrel  75 mg Oral Daily  . enoxaparin (LOVENOX) injection  40 mg Subcutaneous QHS  . escitalopram  5 mg Oral Daily  . feeding supplement (ENSURE ENLIVE)  237 mL Oral Q24H  . levothyroxine  50 mcg Oral Q0600  . loratadine  10 mg Oral Daily  . mouth rinse  15 mL Mouth Rinse BID  . Netarsudil-Latanoprost  1 drop Ophthalmic QHS  . pantoprazole  40 mg Oral BID  . predniSONE  10 mg Oral Q breakfast   Continuous Infusions: . sodium chloride 10 mL/hr at 07/03/19 0600  . ampicillin-sulbactam (UNASYN) IV 3 g (07/03/19 2208)       LOS: 2 days    Time  spent: 25 minutes   Pennie Banter, DO [facility]  07/04/2019, 8:52 AM    If 7PM-7AM, please contact night-coverage. How to contact the [facility] Attending or Consulting provider 7A - 7P or covering provider during after hours 7P -7A, for this patient?    1. Check the care team in [facility] and look for a) attending/consulting TRH provider listed and b) the [facility] team listed 2. Log into [email or web address] and use [place]'s universal password to access. If you do not have the password, please contact the hospital operator. 3. Locate the [facility] provider you are looking for under [facility] and page to a number that you can be directly reached. 4. If you still have difficulty reaching the provider, please page the [facility] (Director on Call) for the Hospitalists listed on amion for assistance.

## 2019-07-05 ENCOUNTER — Ambulatory Visit: Payer: Medicare Other | Admitting: Pulmonary Disease

## 2019-07-05 ENCOUNTER — Inpatient Hospital Stay (HOSPITAL_COMMUNITY): Payer: Medicare Other

## 2019-07-05 LAB — CBC WITH DIFFERENTIAL/PLATELET
Abs Immature Granulocytes: 0.11 10*3/uL — ABNORMAL HIGH (ref 0.00–0.07)
Basophils Absolute: 0 10*3/uL (ref 0.0–0.1)
Basophils Relative: 0 %
Eosinophils Absolute: 0 10*3/uL (ref 0.0–0.5)
Eosinophils Relative: 0 %
HCT: 38.9 % (ref 36.0–46.0)
Hemoglobin: 12.3 g/dL (ref 12.0–15.0)
Immature Granulocytes: 1 %
Lymphocytes Relative: 6 %
Lymphs Abs: 0.9 10*3/uL (ref 0.7–4.0)
MCH: 32.7 pg (ref 26.0–34.0)
MCHC: 31.6 g/dL (ref 30.0–36.0)
MCV: 103.5 fL — ABNORMAL HIGH (ref 80.0–100.0)
Monocytes Absolute: 0.9 10*3/uL (ref 0.1–1.0)
Monocytes Relative: 6 %
Neutro Abs: 13.7 10*3/uL — ABNORMAL HIGH (ref 1.7–7.7)
Neutrophils Relative %: 87 %
Platelets: 254 10*3/uL (ref 150–400)
RBC: 3.76 MIL/uL — ABNORMAL LOW (ref 3.87–5.11)
RDW: 12.1 % (ref 11.5–15.5)
WBC: 15.6 10*3/uL — ABNORMAL HIGH (ref 4.0–10.5)
nRBC: 0 % (ref 0.0–0.2)

## 2019-07-05 LAB — BASIC METABOLIC PANEL
Anion gap: 8 (ref 5–15)
BUN: 24 mg/dL — ABNORMAL HIGH (ref 8–23)
CO2: 31 mmol/L (ref 22–32)
Calcium: 8.3 mg/dL — ABNORMAL LOW (ref 8.9–10.3)
Chloride: 100 mmol/L (ref 98–111)
Creatinine, Ser: 0.67 mg/dL (ref 0.44–1.00)
GFR calc Af Amer: >60 mL/min (ref >60)
GFR calc non Af Amer: >60 mL/min (ref >60)
Glucose, Bld: 121 mg/dL — ABNORMAL HIGH (ref 70–99)
Potassium: 3.8 mmol/L (ref 3.5–5.1)
Sodium: 139 mmol/L (ref 135–145)

## 2019-07-05 LAB — GLUCOSE, CAPILLARY: Glucose-Capillary: 111 mg/dL — ABNORMAL HIGH (ref 70–99)

## 2019-07-05 LAB — MAGNESIUM: Magnesium: 2.2 mg/dL (ref 1.7–2.4)

## 2019-07-05 MED ORDER — FUROSEMIDE 10 MG/ML IJ SOLN
40.0000 mg | Freq: Once | INTRAMUSCULAR | Status: AC
  Administered 2019-07-05: 40 mg via INTRAVENOUS
  Filled 2019-07-05: qty 4

## 2019-07-05 NOTE — Telephone Encounter (Signed)
mychart message sent notifying us that pt is currently in the hospital. Pt is at [facility], has been admitted since 6/18. Routing to Dr. Delton Coombes as an Lorain Childes.

## 2019-07-05 NOTE — Progress Notes (Addendum)
Sarah [name], female    DOB: 12-05-1921,    MRN: [id number]   Brief patient profile:  86 y/o F, never smoker, followed by Dr  Lamonte Sakai in office with dx of bronchiectasis / pseudomonas colonization on rotating abx (omnicef / zmax - as of 12/2018, intermediate to gentamicin, otherwise sensitive) and prednisone 10 mg daily plus vest rx admitted 6/18 with cough productive green sputum, fever to 100.  PCCM asked to eval by daughter who previously worked at [employer] as RT.   Subjective:  Pt reports feeling worse today than yesterday.  Feels tired, wiped out. States she is not sure if the steroids had her "wired".  Afebrile.  Desaturated to 85% on RA walking to rest room   Objective:     BP (!) 158/89 (BP Location: Right Arm)   Pulse 94   Temp 98.1 F (36.7 C) (Oral)   Resp 16   Ht 5\' 6"  (1.676 m)   Wt 57.4 kg   LMP 01/14/1970   SpO2 95%   BMI 20.42 kg/m   SpO2: 95 % O2 Flow Rate (L/min): 2 L/min   Exam: General: frail elderly female lying in bed in NAD HEENT: MM pink/moist, good dentition, anicteric  Neuro: AAOx4, speech clear, MAE. Able to stand from bed with minimal assist  CV: s1s2 RRR, no m/r/g PULM: non-labored on [place] O2, lungs bilaterally with crackles throughout, moist cough GI: soft, bsx4 active  Extremities: warm/dry, no edema  Skin: no rashes or lesions  Studies:  Chest CT 07/02/19 >> No evidence of acute central, lobar or proximal segmental pulmonary embolism. More distal evaluation may be limited by extensive respiratory motion artifact. Diffusely thickened airways with many of the lung bases appearing fluid-filled likely reflecting features of aspiration, likely chronic given chronic architectural distortion and scarring throughout both lungs. More acute areas of airspace disease in the right lower lobe and upper lobe. Mosaic attenuation may reflect further small airways disease or air trapping. Right heart enlargement and reflux of contrast into the hepatic veins and IVC may  reflect elevated right heart pressure/right heart failure. Large hiatal hernia.    Labs  reviewed:   Recent Labs  Lab 07/03/19 0039 07/04/19 0800 07/05/19 0535  HGB 13.3 12.3 12.3  HCT 43.3 39.3 38.9  WBC 15.1* 14.7* 15.6*  PLT 212 239 254   Recent Labs  Lab 07/02/19 1807 07/02/19 1807 07/03/19 0039 07/03/19 0039 07/04/19 0709 07/05/19 0535  NA 137  --  136  --  134* 139  K 3.7   < > 4.4   < > 3.7 3.8  CL 96*  --  97*  --  100 100  CO2 30  --  33*  --  26 31  GLUCOSE 89  --  93  --  125* 121*  BUN 14  --  12  --  18 24*  CREATININE 0.79  --  0.72  --  0.78 0.67  CALCIUM 8.7*  --  8.7*  --  8.2* 8.3*  MG  --   --   --   --  2.1 2.2   < > = values in this interval not displayed.   PCT  6/19    < 0.10        Assessment    Acute Bronchiectasis Exacerbation  Hx of Pseudomonas Colonization  Suspect in setting of possible aspiration but no apparent infiltrate on initial XRAY.  Followed by [name] as outpatient.   -repeat CXR now to ensure no  evolving infiltrate -assess MBS. SLP evaluation -noted large hiatal hernia, at risk for aspiration not directly related to eating  -lasix 40 mg IV x1  -obtain sputum culture -continue chest PT -add flutter valve  -low threshold to change to pseudomonal coverage if new fever, infiltrate on CXR -continue low dose steroids, prednisone 10 mg Qd   Acute Hypoxemic Respiratory Failure In setting of known bronchiectasis, possible right heart involvement noted on CT chest -wean O2 for sats >90% -will need ambulatory O2 needs assessment prior to discharge 6/21   GOC -DNR      Canary Brim, MSN, NP-C [facility] 07/05/2019, 1:49 PM   Please see [email or web address] for pager details.

## 2019-07-05 NOTE — Telephone Encounter (Signed)
See previous phone note. Nothing further needed.

## 2019-07-05 NOTE — Progress Notes (Signed)
  Speech Language Pathology Treatment: Dysphagia  Patient Details Name: Sarah [name] MRN: [id number] DOB: 1921-10-11 Today's Date: 07/05/2019 Time: 3154-0086 SLP Time Calculation (min) (ACUTE ONLY): 20 min  Assessment / Plan / Recommendation Clinical Impression  Pt denies coughing with medication with jello being significant- stating that "everyone does it" where she lives.  She states "pill don't go down" pointing to distal pharynx to indicate lodging. She denies having to cough up pills and states they go down better with applesauce or yogurt.  She has requiring heimlich manuever in the past when she has choked on meat and states now her meats are ground.  Pt denies dysphagia to liquids - stating more problems with foods.  She has a large hiatal hernia which may also contribute to her dysphagia symptoms- possibly resulting in referrant sensation to pharynx from esophagus. Frequent throat clearing noted at baseline and pt noted to have frothy slight yellowish secretions in her sputum cup.     When inquired as to chronicity of throat clearing - pt states it has been going on for a "long time" - thus concern for chronic secretion aspiration is present.  SLP advised pt to dysphagia/aspiration mitigation strategies.  Also advised that her daughter wanted her to have an MBS study.  Pt stated "bless her heart" but approved receiving test.  She did inquire as to how testing would help, after which SlP informed would allow [place] to see the strength and timing of the swallow and if strategies are effective. Advised it may not change her care plan but she agreed to participate.  She agreed to participate in testing.  MBS planned 6/22.    HPI HPI: 86 y.o. female with medical history significant for Hx of bronchiectasis on chronic steroids and rotates azithromycin/Omnicef monthly, CAD s/p NSTEMI, CVA, hypothyroidism, depression, HLD who presents with increasing shortness of breath since yesterday.  Pt underwent BSE  on Saturday 6/19 and daughter desired pt to have MBS but pt was possibly to dc and MBS deferred.  RN paged this SLP today and reported pt overtly coughing with pills with jello.  SLP arrived for session with pt.      SLP Plan  MBS (6/22 planned)       Recommendations  Diet recommendations: Regular;Thin liquid Liquids provided via: Cup;Straw Medication Administration: Whole meds with puree Supervision: Patient able to self feed Compensations: Slow rate;Small sips/bites Postural Changes and/or Swallow Maneuvers: Seated upright 90 degrees;Upright 30-60 min after meal                Oral Care Recommendations: Oral care BID Follow up Recommendations: Other (comment) (MBS) SLP Visit Diagnosis: Dysphagia, pharyngoesophageal phase (R13.14) Plan: MBS (6/22 planned)       GO                Chales Abrahams 07/05/2019, 4:51 PM   Rolena Infante, MS [facility] SLP Acute Rehab Services Office [phone]

## 2019-07-05 NOTE — Progress Notes (Signed)
PROGRESS NOTE    Sarah [name]   IPJ:[id number]  DOB: 1921/03/18  PCP: [name], MD    DOA: 07/02/2019 LOS: 3   Brief Narrative   Sarah [name] is a 86 y.o. female with medical history significant for Hx of bronchiectasis on chronic steroids and rotates azithromycin/Omnicef monthly (follows with [name]), CAD s/p NSTEMI, CVA, hypothyroidism, depression, HLD who presented to the ED on 07/02/19 with acute and progressive shortness of breath since the day before.  Associated productive cough worse than baseline, low grade fever and hypoxia.  In the ED, afebrile, mildly tachypneic, requiring 2L and hypertensive up to systolic of 734L.  Lab work notable for leukocytosis of 13.8.  Lactic acid of 1.1.  Normal creatinine of 0.79.  Chest x-ray had no acute abnormalities.  CTA chest was negative for PE but showed diffusely thickened airways with lower airways fluid-filled concerning for aspiration, and more acute areas of airspace opacities in the right upper and lower lobes.  Admitted for further evaluation and management of aspiration pneumonia vs bronchiectasis flare.    Of note, patient was hospitalized in December 2020 for Pseudomonas pneumonia and had to have left pleural effusion requiring thoracentesis and removal of 450 mL of transudative pleural fluid.   Assessment & Plan    Acute hypoxic respiratory failure secondary to aspiration pneumonia vs bronchiectasis flare  On chronic antibiotics for bronchiectasis outpatient.   Received IV Rocephin and azithromycin in ED.  Procalcitonin < 0.10.  Patient does have history of Pseudomonas pneumonia in Dec 2020.   Patient currently on Unasyn.  She is currently on 2 L of oxygen by nasal cannula.  Speech therapy is following.  Modified barium swallow is recommended.  Follow-up on culture data.  She is also on prednisone chronically at home which is being continued.  Pulmonology is following.   Evaluation.  Essential Hypertension Occasional  high readings noted.  Patient is on carvedilol.  Apparently uses amlodipine only for blood pressures greater than 937 systolic.  Also on hydralazine as needed.    Hx of CAD Stable.  She is asymptomatic  History of CVA Stable.  Continue Plavix  History anxiety Continue Lexapro and Xanax  Hypothyroidism Continue Synthroid  Glaucoma Continue eyedrops brought from home   Patient BMI: Body mass index is 20.42 kg/m.   DVT prophylaxis: enoxaparin (LOVENOX) injection 40 mg Start: 07/02/19 2200   Diet:  Diet Orders (From admission, onward)    Start     Ordered   07/03/19 1152  Diet regular Room service appropriate? Yes; Fluid consistency: Thin  Diet effective now       Question Answer Comment  Room service appropriate? Yes   Fluid consistency: Thin      07/03/19 1151            Code Status: DNR    Subjective 07/05/19    Patient very anxious this morning.  She is concerned that she may not get better.  Continues to have cough with green expectoration.  No chest pain.  No nausea vomiting.     Disposition Plan & Communication   Status is: Inpatient  Remains inpatient appropriate because:IV treatments appropriate due to intensity of illness or inability to take PO   Dispo:  Patient From: Home  Planned Disposition: Home  Expected discharge date: 07/05/19 (if weaned off oxygen and doing better, PT eval pending)  Medically stable for discharge: No     Family Communication: No family at bedside today.     Consults,  Procedures, Significant Events   Consultants:   Pulmonology  Antimicrobials:   Rocephin and Zithromax in ED 6/18  Unasyn 6/18 >>   Objective   Vitals:   07/05/19 0857 07/05/19 1000 07/05/19 1021 07/05/19 1027  BP: (!) 193/92 (!) 158/89    Pulse:  94    Resp:      Temp:      TempSrc:      SpO2:  99% 98% 95%  Weight:      Height:        Intake/Output Summary (Last 24 hours) at 07/05/2019 1307 Last data filed at 07/05/2019  0600 Gross per 24 hour  Intake 440 ml  Output --  Net 440 ml   Filed Weights   07/03/19 0111  Weight: 57.4 kg    Physical Exam:  General appearance: Awake alert.  In no distress Resp: Noted to be tachypneic.  No use of accessory muscles.  Crackles heard bilaterally with few rhonchi.  Scattered wheezing.   Cardio: S1-S2 is normal regular.  No S3-S4.  No rubs murmurs or bruit GI: Abdomen is soft.  Nontender nondistended.  Bowel sounds are present normal.  No masses organomegaly Extremities: No edema.  Full range of motion of lower extremities. Neurologic: Alert and oriented x3.  No focal neurological deficits.    Labs   Data Reviewed: I have personally reviewed following labs and imaging studies  CBC: Recent Labs  Lab 07/02/19 1807 07/03/19 0039 07/04/19 0800 07/05/19 0535  WBC 15.8* 15.1* 14.7* 15.6*  NEUTROABS 13.3*  --  13.4* 13.7*  HGB 14.0 13.3 12.3 12.3  HCT 44.8 43.3 39.3 38.9  MCV 103.9* 105.4* 104.5* 103.5*  PLT 247 212 239 254   Basic Metabolic Panel: Recent Labs  Lab 07/02/19 1807 07/03/19 0039 07/04/19 0709 07/05/19 0535  NA 137 136 134* 139  K 3.7 4.4 3.7 3.8  CL 96* 97* 100 100  CO2 30 33* 26 31  GLUCOSE 89 93 125* 121*  BUN 14 12 18  24*  CREATININE 0.79 0.72 0.78 0.67  CALCIUM 8.7* 8.7* 8.2* 8.3*  MG  --   --  2.1 2.2   GFR: Estimated Creatinine Clearance: 37.3 mL/min (by C-G formula based on SCr of 0.67 mg/dL).  Recent Labs  Lab 07/02/19 1910 07/03/19 0039  PROCALCITON  --  <0.10  LATICACIDVEN 1.1 1.3    Recent Results (from the past 240 hour(s))  Culture, blood (routine x 2)     Status: None (Preliminary result)   Collection Time: 07/02/19  6:07 PM   Specimen: BLOOD LEFT FOREARM  Result Value Ref Range Status   Specimen Description   Final    BLOOD LEFT FOREARM Performed at [facility], [address] [place], [place]    Special Requests   Final    BOTTLES DRAWN AEROBIC AND ANAEROBIC Blood Culture adequate  volume Performed at [facility], [address], [place], [place]    Culture   Final    NO GROWTH 3 DAYS Performed at [facility], [address] [place], [place]    Report Status PENDING  Incomplete  Culture, blood (routine x 2)     Status: None (Preliminary result)   Collection Time: 07/02/19  6:12 PM   Specimen: BLOOD  Result Value Ref Range Status   Specimen Description   Final    BLOOD SITE NOT SPECIFIED Performed at [facility], [address], [place], [place]  [address]    Special Requests   Final    BOTTLES DRAWN AEROBIC AND ANAEROBIC Blood Culture adequate volume Performed at [facility], [address] [place], [place] [address]    Culture   Final    NO GROWTH 3 DAYS Performed at [facility], [address], [place], [place] [address]    Report Status PENDING  Incomplete  SARS Coronavirus 2 by RT PCR (hospital order, performed in [facility] hospital lab) Nasopharyngeal Nasopharyngeal Swab     Status: None   Collection Time: 07/02/19  7:26 PM   Specimen: Nasopharyngeal Swab  Result Value Ref Range Status   SARS Coronavirus 2 NEGATIVE NEGATIVE Final    Comment: (NOTE) SARS-CoV-2 target nucleic acids are NOT DETECTED.  The SARS-CoV-2 RNA is generally detectable in upper and lower respiratory specimens during the acute phase of infection. The lowest concentration of SARS-CoV-2 viral copies this assay can detect is 250 copies / mL. A negative result does not preclude SARS-CoV-2 infection and should not be used as the sole basis for treatment or other patient management decisions.  A negative result may occur with improper specimen collection / handling, submission of specimen other than nasopharyngeal swab, presence of viral mutation(s) within the areas targeted by this assay, and inadequate number of viral copies (<250 copies / mL). A negative result must be combined with  clinical observations, patient history, and epidemiological information.  Fact Sheet for Patients:   BoilerBrush.com.cy  Fact Sheet for Healthcare Providers: https://pope.com/  This test is not yet approved or  cleared by the [place] FDA and has been authorized for detection and/or diagnosis of SARS-CoV-2 by FDA under an Emergency Use Authorization (EUA).  This EUA will remain in effect (meaning this test can be used) for the duration of the COVID-19 declaration under Section 564(b)(1) of the Act, 21 U.S.C. section 360bbb-3(b)(1), unless the authorization is terminated or revoked sooner.  Performed at [facility], [address], [place], [place] [address]       Imaging Studies   No results found.   Medications   Scheduled Meds: . albuterol  2.5 mg Nebulization TID  . ALPRAZolam  0.5 mg Oral BID  . Brinzolamide-Brimonidine  1 drop Both Eyes TID  . carvedilol  6.25 mg Oral BID WC  . clopidogrel  75 mg Oral Daily  . enoxaparin (LOVENOX) injection  40 mg Subcutaneous QHS  . escitalopram  5 mg Oral Daily  . feeding supplement (ENSURE ENLIVE)  237 mL Oral Q24H  . levothyroxine  50 mcg Oral Q0600  . loratadine  10 mg Oral Daily  . mouth rinse  15 mL Mouth Rinse BID  . Netarsudil-Latanoprost  1 drop Ophthalmic QHS  . pantoprazole  40 mg Oral BID  . predniSONE  10 mg Oral Q breakfast   Continuous Infusions: . ampicillin-sulbactam (UNASYN) IV 3 g (07/05/19 0954)       LOS: 3 days      Osvaldo Shipper,  [facility]  07/05/2019, 1:07 PM    If 7PM-7AM, please contact night-coverage. How to contact the [facility] Attending or Consulting provider 7A - 7P or covering provider during after hours 7P -7A, for this patient?    1. Check the care team in [facility] and look for a) attending/consulting TR[facility] provider listed and b) the [facility] team listed 2. Log into [email or web address] and use [place]'s universal  password to access. If you do not have the password, please contact the  hospital operator. 3. Locate the [facility] provider you are looking for under [facility] and page to a number that you can be directly reached. 4. If you still have difficulty reaching the provider, please page the [facility] (Director on Call) for the Hospitalists listed on amion for assistance.

## 2019-07-05 NOTE — Evaluation (Signed)
Occupational Therapy Evaluation Patient Details Name: Sarah [name] MRN: [id number] DOB: 15-Nov-1921 Today's Date: 07/05/2019    History of Present Illness Sarah [name] is a 86 y.o. female with medical history significant for Hx of bronchiectasis on chronic steroids and rotates azithromycin/Omnicef monthly (follows with [name]), CAD s/p NSTEMI, CVA, hypothyroidism, depression, HLD who presented to the ED on 07/02/19 with acute and progressive shortness of breath since the day before.  Associated productive cough worse than baseline, low grade fever and hypoxia.  In the ED, afebrile, mildly tachypneic, requiring 2L and hypertensive up to systolic of 326Z.  Lab work notable for leukocytosis of 13.8.  Lactic acid of 1.1.  Normal creatinine of 0.79.  Chest x-ray had no acute abnormalities.  CTA chest was negative for PE but showed diffusely thickened airways with lower airways fluid-filled concerning for aspiration, and more acute areas of airspace opacities in the right upper and lower lobes.  Admitted for further evaluation and management of aspiration pneumonia vs bronchiectasis flare.   Clinical Impression   Pt admitted with the above. Pt currently with functional limitations due to the deficits listed below (see OT Problem List).  Pt will benefit from skilled OT to increase their safety and independence with ADL and functional mobility for ADL to facilitate discharge to venue listed below.      Follow Up Recommendations  Home health OT;Supervision/Assistance - 24 hour    Equipment Recommendations  3 in 1 bedside commode    Recommendations for Other Services       Precautions / Restrictions Precautions Precautions: Fall      Mobility Bed Mobility Overal bed mobility: Needs Assistance Bed Mobility: Supine to Sit;Sit to Supine     Supine to sit: Min assist Sit to supine: Min assist      Transfers Overall transfer level: Needs assistance   Transfers: Sit to/from [employer] Transfers Sit to Stand: Min assist Stand pivot transfers: Min assist            Balance Overall balance assessment: Mild deficits observed, not formally tested                                         ADL either performed or assessed with clinical judgement   ADL Overall ADL's : Needs assistance/impaired Eating/Feeding: Set up;Sitting   Grooming: Wash/dry hands;Wash/dry face;Sitting;Cueing for safety   Upper Body Bathing: Set up;Sitting   Lower Body Bathing: Minimal assistance;Sit to/from stand;Cueing for safety;Cueing for compensatory techniques;Cueing for sequencing   Upper Body Dressing : Set up;Sitting   Lower Body Dressing: Minimal assistance;Sit to/from stand;Cueing for compensatory techniques;Cueing for safety;Cueing for sequencing   Toilet Transfer: Minimal assistance;Stand-pivot;Cueing for safety;Cueing for sequencing   Toileting- Clothing Manipulation and Hygiene: Minimal assistance;Sit to/from stand;Cueing for safety;Cueing for compensatory techniques       Functional mobility during ADLs: Minimal assistance;Rolling walker       Vision Patient Visual Report: No change from baseline              Pertinent Vitals/Pain Pain Assessment: No/denies pain     Hand Dominance     Extremity/Trunk Assessment Upper Extremity Assessment Upper Extremity Assessment: Generalized weakness           Communication Communication Communication: No difficulties   Cognition Arousal/Alertness: Awake/alert Behavior During Therapy: WFL for tasks assessed/performed Overall Cognitive Status: Within Functional Limits for tasks assessed  Home Living Family/patient expects to be discharged to:: Private residence Living Arrangements: Alone Available Help at Discharge: Family;Available PRN/intermittently Type of Home: Independent living facility       Home Layout: One level      Bathroom Shower/Tub: Walk-in shower         Home Equipment: Environmental consultant - 4 wheels;Cane - single point          Prior Functioning/Environment Level of Independence: Needs assistance                 OT Problem List: Decreased strength;Impaired balance (sitting and/or standing);Decreased activity tolerance      OT Treatment/Interventions: Self-care/ADL training;Patient/family education;DME and/or AE instruction    OT Goals(Current goals can be found in the care plan section) Acute Rehab OT Goals Patient Stated Goal: back ti ILF with some increased A OT Goal Formulation: With patient Time For Goal Achievement: 07/12/19 Potential to Achieve Goals: Good  OT Frequency: Min 2X/week    AM-PAC OT "6 Clicks" Daily Activity     Outcome Measure Help from another person eating meals?: None Help from another person taking care of personal grooming?: A Little Help from another person toileting, which includes using toliet, bedpan, or urinal?: A Little Help from another person bathing (including washing, rinsing, drying)?: A Little Help from another person to put on and taking off regular upper body clothing?: A Little Help from another person to put on and taking off regular lower body clothing?: A Little 6 Click Score: 19   End of Session Equipment Utilized During Treatment: Rolling walker Nurse Communication: Mobility status  Activity Tolerance: Patient limited by fatigue Patient left: in bed;with call bell/phone within reach;with family/visitor present  OT Visit Diagnosis: Unsteadiness on feet (R26.81);Other abnormalities of gait and mobility (R26.89);Muscle weakness (generalized) (M62.81)                Time: 1100-1134 OT Time Calculation (min): 34 min Charges:  OT General Charges $OT Visit: 1 Visit OT Evaluation $OT Eval Moderate Complexity: 1 Mod OT Treatments $Self Care/Home Management : 8-22 mins  Lise Auer, OT Acute Rehabilitation Services [phone] Office- [phone]     [name], [name] 07/05/2019, 2:30 PM

## 2019-07-05 NOTE — Evaluation (Signed)
Physical Therapy Evaluation Patient Details Name: Sarah [name] MRN: [id number] DOB: 04-05-1921 Today's Date: 07/05/2019   History of Present Illness  86 yo female admitted with acute respiratory failure 2* Pna vs bronchiectasis. Hx of CVA, NSTEMI, depression, osteopenia, CAD  Clinical Impression  On eval, pt was Min assist for mobility. She walked ~15 feet x 2 (to and from bathroom). O2 85% on RA, dyspnea 2/4 with audible wheezing (during activity). Pt is highly motivated to mobilize-so much so that she is a bit impulsive at times. Session limited by x-ray team arrival. Discussed d/c plan with pt/family-plan is for return to [place] facility. Will follow and progress activity as tolerated.     Follow Up Recommendations Home health PT;Supervision - Intermittent    Equipment Recommendations  None recommended by PT    Recommendations for Other Services       Precautions / Restrictions Precautions Precautions: Fall Precaution Comments: monitor O2 Restrictions Weight Bearing Restrictions: No      Mobility  Bed Mobility Overal bed mobility: Needs Assistance Bed Mobility: Sit to Supine     Supine to sit: Min assist Sit to supine: Supervision   General bed mobility comments: for safety, lines  Transfers Overall transfer level: Needs assistance Equipment used: None Transfers: Sit to/from Stand Sit to Stand: Min assist Stand pivot transfers: Min assist       General transfer comment: Assist to steady. Cues for safety. Pt is a bit impulsive/moves quickly  Ambulation/Gait Ambulation/Gait assistance: Min assist Gait Distance (Feet): 15 Feet (x2) Assistive device: None Gait Pattern/deviations: Step-through pattern;Decreased stride length     General Gait Details: Unsteady. Pt refused RW use for short distance. O2 85% on RA, dyspnea 2/4 with audible wheezing  Stairs            Wheelchair Mobility    Modified Rankin (Stroke Patients Only)       Balance  Overall balance assessment: Mild deficits observed, not formally tested                                           Pertinent Vitals/Pain Pain Assessment: Faces Faces Pain Scale: Hurts a little bit Pain Location: headache Pain Intervention(s): Monitored during session;Repositioned    Home Living Family/patient expects to be discharged to:: Private residence Living Arrangements: Alone Available Help at Discharge: Family;Available PRN/intermittently Type of Home: Independent living facility       Home Layout: One level Home Equipment: [employer] - 4 wheels;Cane - single point      Prior Function Level of Independence: Independent with assistive device(s);Needs assistance   Gait / Transfers Assistance Needed: rollator vs cane for ambulation.  ADL's / Homemaking Assistance Needed: per family/pt, bathing/dressing independently        Hand Dominance        Extremity/Trunk Assessment   Upper Extremity Assessment Upper Extremity Assessment: Defer to OT evaluation    Lower Extremity Assessment Lower Extremity Assessment: Generalized weakness    Cervical / Trunk Assessment Cervical / Trunk Assessment: Kyphotic  Communication   Communication: No difficulties  Cognition Arousal/Alertness: Awake/alert Behavior During Therapy: WFL for tasks assessed/performed Overall Cognitive Status: Within Functional Limits for tasks assessed  General Comments      Exercises     Assessment/Plan    PT Assessment Patient needs continued PT services  PT Problem List Decreased strength;Decreased mobility;Decreased activity tolerance;Decreased balance       PT Treatment Interventions DME instruction;Gait training;Therapeutic activities;Patient/family education;Balance training;Functional mobility training    PT Goals (Current goals can be found in the Care Plan section)  Acute Rehab PT Goals Patient Stated Goal:  back to ILF with some increased A PT Goal Formulation: With patient/family Time For Goal Achievement: 07/19/19 Potential to Achieve Goals: Good    Frequency Min 3X/week   Barriers to discharge        Co-evaluation               AM-PAC PT "6 Clicks" Mobility  Outcome Measure Help needed turning from your back to your side while in a flat bed without using bedrails?: A Little Help needed moving from lying on your back to sitting on the side of a flat bed without using bedrails?: A Little Help needed moving to and from a bed to a chair (including a wheelchair)?: A Little Help needed standing up from a chair using your arms (e.g., wheelchair or bedside chair)?: A Little Help needed to walk in hospital room?: A Little Help needed climbing 3-5 steps with a railing? : A Little 6 Click Score: 18    End of Session   Activity Tolerance: Patient limited by fatigue Patient left: in bed;with call bell/phone within reach;with bed alarm set;with family/visitor present   PT Visit Diagnosis: Unsteadiness on feet (R26.81);Difficulty in walking, not elsewhere classified (R26.2)    Time: 6629-4765 PT Time Calculation (min) (ACUTE ONLY): 17 min   Charges:   PT Evaluation $PT Eval Low Complexity: 1 Low             Faye Ramsay, PT Acute Rehabilitation  Office: [phone] Pager: [phone]

## 2019-07-05 NOTE — Care Management Important Message (Signed)
Important Message  Patient Details IM Letter given to Sandford Craze RN Case Manager to present to the Patient Name: Sarah [name] MRN: [id number] Date of Birth: 1921-02-03   Medicare Important Message Given:  Yes     Caren Macadam 07/05/2019, 10:14 AM

## 2019-07-05 NOTE — Telephone Encounter (Signed)
Thank you for letting me know. Please set up an OV for her w me

## 2019-07-06 LAB — BASIC METABOLIC PANEL
Anion gap: 11 (ref 5–15)
BUN: 15 mg/dL (ref 8–23)
CO2: 31 mmol/L (ref 22–32)
Calcium: 8.3 mg/dL — ABNORMAL LOW (ref 8.9–10.3)
Chloride: 98 mmol/L (ref 98–111)
Creatinine, Ser: 0.82 mg/dL (ref 0.44–1.00)
GFR calc Af Amer: >60 mL/min (ref >60)
GFR calc non Af Amer: >60 mL/min (ref >60)
Glucose, Bld: 81 mg/dL (ref 70–99)
Potassium: 3.6 mmol/L (ref 3.5–5.1)
Sodium: 140 mmol/L (ref 135–145)

## 2019-07-06 LAB — CBC WITH DIFFERENTIAL/PLATELET
Abs Immature Granulocytes: 0.05 10*3/uL (ref 0.00–0.07)
Basophils Absolute: 0 10*3/uL (ref 0.0–0.1)
Basophils Relative: 0 %
Eosinophils Absolute: 0 10*3/uL (ref 0.0–0.5)
Eosinophils Relative: 0 %
HCT: 41.8 % (ref 36.0–46.0)
Hemoglobin: 13.2 g/dL (ref 12.0–15.0)
Immature Granulocytes: 1 %
Lymphocytes Relative: 13 %
Lymphs Abs: 1.4 10*3/uL (ref 0.7–4.0)
MCH: 32.5 pg (ref 26.0–34.0)
MCHC: 31.6 g/dL (ref 30.0–36.0)
MCV: 103 fL — ABNORMAL HIGH (ref 80.0–100.0)
Monocytes Absolute: 1.1 10*3/uL — ABNORMAL HIGH (ref 0.1–1.0)
Monocytes Relative: 10 %
Neutro Abs: 8.4 10*3/uL — ABNORMAL HIGH (ref 1.7–7.7)
Neutrophils Relative %: 76 %
Platelets: 251 10*3/uL (ref 150–400)
RBC: 4.06 MIL/uL (ref 3.87–5.11)
RDW: 12.1 % (ref 11.5–15.5)
WBC: 11.1 10*3/uL — ABNORMAL HIGH (ref 4.0–10.5)
nRBC: 0 % (ref 0.0–0.2)

## 2019-07-06 LAB — EXPECTORATED SPUTUM ASSESSMENT W GRAM STAIN, RFLX TO RESP C

## 2019-07-06 MED ORDER — ALBUTEROL SULFATE (2.5 MG/3ML) 0.083% IN NEBU
2.5000 mg | INHALATION_SOLUTION | Freq: Two times a day (BID) | RESPIRATORY_TRACT | Status: DC
  Administered 2019-07-06 – 2019-07-09 (×7): 2.5 mg via RESPIRATORY_TRACT
  Filled 2019-07-06 (×7): qty 3

## 2019-07-06 MED ORDER — BRINZOLAMIDE-BRIMONIDINE 1-0.2 % OP SUSP
1.0000 [drp] | Freq: Three times a day (TID) | OPHTHALMIC | Status: DC
  Administered 2019-07-06 – 2019-07-09 (×9): 1 [drp] via OPHTHALMIC

## 2019-07-06 NOTE — Progress Notes (Signed)
Occupational Therapy Treatment Patient Details Name: Sarah [name] MRN: [id number] DOB: Jun 28, 1921 Today's Date: 07/06/2019    History of present illness 86 yo female admitted with acute respiratory failure 2* Pna vs bronchiectasis. Hx of CVA, NSTEMI, depression, osteopenia, CAD   OT comments  Family will arrange 24/7 A at ILF  Follow Up Recommendations  Home health OT;Supervision/Assistance - 24 hour    Equipment Recommendations  3 in 1 bedside commode    Recommendations for Other Services      Precautions / Restrictions Precautions Precautions: Fall       Mobility Bed Mobility Overal bed mobility: Needs Assistance Bed Mobility: Sit to Supine     Supine to sit: Supervision        Transfers Overall transfer level: Needs assistance Equipment used: Rolling walker (2 wheeled) Transfers: Sit to/from [employer] Sit to Stand: Min assist Stand pivot transfers: Min assist            Balance Overall balance assessment: Mild deficits observed, not formally tested                                         ADL either performed or assessed with clinical judgement   ADL Overall ADL's : Needs assistance/impaired Eating/Feeding: Set up;Sitting   Grooming: Wash/dry hands;Wash/dry face;Sitting;Cueing for safety                   Toilet Transfer: Minimal assistance;Stand-pivot;Cueing for safety;Cueing for sequencing   Toileting- Clothing Manipulation and Hygiene: Minimal assistance;Sit to/from stand;Cueing for safety;Cueing for compensatory techniques       Functional mobility during ADLs: Minimal assistance;Rolling walker       Vision Patient Visual Report: No change from baseline            Cognition Arousal/Alertness: Awake/alert Behavior During Therapy: WFL for tasks assessed/performed Overall Cognitive Status: Within Functional Limits for tasks assessed                                                      Pertinent Vitals/ Pain       Pain Assessment: No/denies pain         Frequency  Min 2X/week        Progress Toward Goals  OT Goals(current goals can now be found in the care plan section)  Progress towards OT goals: Progressing toward goals     Plan Discharge plan remains appropriate       AM-PAC OT "6 Clicks" Daily Activity     Outcome Measure   Help from another person eating meals?: None Help from another person taking care of personal grooming?: A Little Help from another person toileting, which includes using toliet, bedpan, or urinal?: A Little Help from another person bathing (including washing, rinsing, drying)?: A Little Help from another person to put on and taking off regular upper body clothing?: A Little Help from another person to put on and taking off regular lower body clothing?: A Little 6 Click Score: 19    End of Session Equipment Utilized During Treatment: Rolling walker  OT Visit Diagnosis: Unsteadiness on feet (R26.81);Other abnormalities of gait and mobility (R26.89);Muscle weakness (generalized) (M62.81)   Activity Tolerance Patient limited by fatigue   Patient Left  with call bell/phone within reach;with family/visitor present;in chair   Nurse Communication Mobility status        Time: 1030-1100 OT Time Calculation (min): 30 min  Charges: OT General Charges $OT Visit: 1 Visit OT Treatments $Self Care/Home Management : 23-37 mins  Sarah [name], [place] [phone] Office- [phone], [name] 07/06/2019, 12:48 PM

## 2019-07-06 NOTE — Progress Notes (Addendum)
PROGRESS NOTE    Sarah [name]   OZH:[id number]  DOB: October 13, 1921  PCP: [name], MD    DOA: 07/02/2019 LOS: 4   Brief Narrative   Sarah [name] is a 86 y.o. female with medical history significant for Hx of bronchiectasis on chronic steroids and rotates azithromycin/Omnicef monthly (follows with Dr. Delton Coombes), CAD s/p NSTEMI, CVA, hypothyroidism, depression, HLD who presented to the ED on 07/02/19 with acute and progressive shortness of breath since the day before.  Associated productive cough worse than baseline, low grade fever and hypoxia.  In the ED, afebrile, mildly tachypneic, requiring 2L and hypertensive up to systolic of 180s.  Lab work notable for leukocytosis of 13.8.  Lactic acid of 1.1.  Normal creatinine of 0.79.  Chest x-ray had no acute abnormalities.  CTA chest was negative for PE but showed diffusely thickened airways with lower airways fluid-filled concerning for aspiration, and more acute areas of airspace opacities in the right upper and lower lobes.  Admitted for further evaluation and management of aspiration pneumonia vs bronchiectasis flare.    Of note, patient was hospitalized in December 2020 for Pseudomonas pneumonia and had to have left pleural effusion requiring thoracentesis and removal of 450 mL of transudative pleural fluid.   Assessment & Plan    Acute hypoxic respiratory failure secondary to aspiration pneumonia vs bronchiectasis flare  On chronic antibiotics for bronchiectasis as outpatient.   Received IV Rocephin and azithromycin in ED.  Procalcitonin < 0.10.  Patient does have history of Pseudomonas pneumonia in Dec 2020.   Patient on Unasyn.  Requiring 2 L of oxygen by nasal cannula.  WBC is better. Speech therapy is following.  Modified barium swallow was considered however patient and her daughter declined it at this time.   She is also on prednisone chronically at home.  Pulmonology is following.  PT and OT.   May need home oxygen at  discharge.  Essential Hypertension Occasional high readings noted.  Patient is on carvedilol.  Apparently uses amlodipine only for blood pressures greater than 180 systolic is from her PCP.  Also on hydralazine as needed.    Hx of CAD Stable.  She is asymptomatic  History of CVA Stable.  Continue Plavix  History anxiety Continue Lexapro and Xanax  Hypothyroidism Continue Synthroid  Glaucoma Continue eyedrops brought from home   Patient BMI: Body mass index is 20.42 kg/m.   DVT prophylaxis: enoxaparin (LOVENOX) injection 40 mg Start: 07/02/19 2200   Diet:  Diet Orders (From admission, onward)    Start     Ordered   07/03/19 1152  Diet regular Room service appropriate? Yes; Fluid consistency: Thin  Diet effective now       Question Answer Comment  Room service appropriate? Yes   Fluid consistency: Thin      07/03/19 1151            Code Status: DNR    Subjective 07/06/19    Patient seems to be doing a little better today.  Her daughter is at the bedside.  Continues to have a cough with yellowish expectoration.  No chest pain.   Disposition Plan & Communication   Status is: Inpatient  Remains inpatient appropriate because:IV treatments appropriate due to intensity of illness or inability to take PO   Dispo:  Patient From: Home  Planned Disposition: To be determined  Expected discharge date: 07/08/19  Medically stable for discharge: No     Family Communication: Discussed with the daughter who was at  the bedside.   Consults, Procedures, Significant Events   Consultants:   Pulmonology  Antimicrobials:   Rocephin and Zithromax in ED 6/18  Unasyn 6/18 >>   Objective   Vitals:   07/05/19 1900 07/05/19 2123 07/06/19 0548 07/06/19 0837  BP: (!) 169/93  (!) 191/92   Pulse: 96  84   Resp: 18  17   Temp: 98.6 F (37 C)  98.3 F (36.8 C)   TempSrc: Oral  Oral   SpO2: 95% 97% 96% 95%  Weight:      Height:        Intake/Output  Summary (Last 24 hours) at 07/06/2019 1214 Last data filed at 07/05/2019 1845 Gross per 24 hour  Intake 480 ml  Output --  Net 480 ml   Filed Weights   07/03/19 0111  Weight: 57.4 kg    Physical Exam:  General appearance: Awake alert.  In no distress Resp: Mildly tachypneic.  No use of accessory muscles.  Coarse breath sounds with crackles bilaterally.  No wheezing or rhonchi.   Cardio: S1-S2 is normal regular.  No S3-S4.  No rubs murmurs or bruit GI: Abdomen is soft.  Nontender nondistended.  Bowel sounds are present normal.  No masses organomegaly Extremities: No edema.  Full range of motion of lower extremities. Neurologic: Alert and oriented x3.  No focal neurological deficits.     Labs   Data Reviewed: I have personally reviewed following labs and imaging studies  CBC: Recent Labs  Lab 07/02/19 1807 07/03/19 0039 07/04/19 0800 07/05/19 0535 07/06/19 0537  WBC 15.8* 15.1* 14.7* 15.6* 11.1*  NEUTROABS 13.3*  --  13.4* 13.7* 8.4*  HGB 14.0 13.3 12.3 12.3 13.2  HCT 44.8 43.3 39.3 38.9 41.8  MCV 103.9* 105.4* 104.5* 103.5* 103.0*  PLT 247 212 239 254 251   Basic Metabolic Panel: Recent Labs  Lab 07/02/19 1807 07/03/19 0039 07/04/19 0709 07/05/19 0535 07/06/19 0537  NA 137 136 134* 139 140  K 3.7 4.4 3.7 3.8 3.6  CL 96* 97* 100 100 98  CO2 30 33* 26 31 31   GLUCOSE 89 93 125* 121* 81  BUN 14 12 18  24* 15  CREATININE 0.79 0.72 0.78 0.67 0.82  CALCIUM 8.7* 8.7* 8.2* 8.3* 8.3*  MG  --   --  2.1 2.2  --    GFR: Estimated Creatinine Clearance: 36.4 mL/min (by C-G formula based on SCr of 0.82 mg/dL).  Recent Labs  Lab 07/02/19 1910 07/03/19 0039  PROCALCITON  --  <0.10  LATICACIDVEN 1.1 1.3    Recent Results (from the past 240 hour(s))  Culture, blood (routine x 2)     Status: None (Preliminary result)   Collection Time: 07/02/19  6:07 PM   Specimen: BLOOD LEFT FOREARM  Result Value Ref Range Status   Specimen Description   Final    BLOOD LEFT  FOREARM Performed at [facility], [address] [place], [address] [place]    Special Requests   Final    BOTTLES DRAWN AEROBIC AND ANAEROBIC Blood Culture adequate volume Performed at [facility], [address] [place], [place]    Culture   Final    NO GROWTH 4 DAYS Performed at [facility], [address] [place], [address] [place]    Report Status PENDING  Incomplete  Culture, blood (routine x 2)     Status: None (Preliminary result)   Collection Time: 07/02/19  6:12 PM   Specimen:  BLOOD  Result Value Ref Range Status   Specimen Description   Final    BLOOD SITE NOT SPECIFIED Performed at [place] [address] [place], [place] [address]    Special Requests   Final    BOTTLES DRAWN AEROBIC AND ANAEROBIC Blood Culture adequate volume Performed at [place] [address], [place], [place] [address]    Culture   Final    NO GROWTH 4 DAYS Performed at [facility], [place] [address], [place], [place] [address]    Report Status PENDING  Incomplete  SARS Coronavirus 2 by RT PCR (hospital order, performed in [facility] hospital lab) Nasopharyngeal Nasopharyngeal Swab     Status: None   Collection Time: 07/02/19  7:26 PM   Specimen: Nasopharyngeal Swab  Result Value Ref Range Status   SARS Coronavirus 2 NEGATIVE NEGATIVE Final    Comment: (NOTE) SARS-CoV-2 target nucleic acids are NOT DETECTED.  The SARS-CoV-2 RNA is generally detectable in upper and lower respiratory specimens during the acute phase of infection. The lowest concentration of SARS-CoV-2 viral copies this assay can detect is 250 copies / mL. A negative result does not preclude SARS-CoV-2 infection and should not be used as the sole basis for treatment or other patient management decisions.  A negative result may occur with improper specimen collection / handling, submission of specimen other than nasopharyngeal swab,  presence of viral mutation(s) within the areas targeted by this assay, and inadequate number of viral copies (<250 copies / mL). A negative result must be combined with clinical observations, patient history, and epidemiological information.  Fact Sheet for Patients:   StrictlyIdeas.no  Fact Sheet for Healthcare Providers: BankingDealers.co.za  This test is not yet approved or  cleared by the [place] FDA and has been authorized for detection and/or diagnosis of SARS-CoV-2 by FDA under an Emergency Use Authorization (EUA).  This EUA will remain in effect (meaning this test can be used) for the duration of the COVID-19 declaration under Section 564(b)(1) of the Act, 21 U.S.C. section 360bbb-3(b)(1), unless the authorization is terminated or revoked sooner.  Performed at [facility], [place] [address] [place], [place] [address]   Expectorated sputum assessment w rflx to resp cult     Status: None   Collection Time: 07/06/19 11:21 AM   Specimen: Expectorated Sputum  Result Value Ref Range Status   Specimen Description EXPECTORATED SPUTUM  Final   Special Requests NONE  Final   Sputum evaluation   Final    THIS SPECIMEN IS ACCEPTABLE FOR SPUTUM CULTURE Performed at [facility], [place] [address] [place], [place] [address]    Report Status 07/06/2019 FINAL  Final      Imaging Studies   DG CHEST PORT 1 VIEW  Result Date: 07/05/2019 CLINICAL DATA:  86 year old female with bronchiectasis. EXAM: PORTABLE CHEST 1 VIEW COMPARISON:  Chest CT dated 07/02/2019 and chest radiograph dated 07/02/2019. FINDINGS: Right upper lobe nodular density as well as smaller densities in the left upper lobe. There is a patchy area of airspace opacity in the right infrahilar region likely corresponding to the density seen on the prior CT in the right lower lobe. Left lung base opacity appears similar to prior radiograph  and corresponds to the consolidative changes of the lingula and left lower lobe. Probable small bilateral pleural effusions. No pneumothorax. Stable cardiomediastinal silhouette. Moderate size hiatal hernia. No acute osseous pathology. IMPRESSION: 1. Bilateral upper lobe and lower  lobe consolidative changes as seen on the prior CT. 2. Probable small bilateral pleural effusions.  No pneumothorax. Electronically Signed   By: Elgie Collard M.D.   On: 07/05/2019 16:17     Medications   Scheduled Meds: . albuterol  2.5 mg Nebulization BID  . ALPRAZolam  0.5 mg Oral BID  . Brinzolamide-Brimonidine  1 drop Left Eye TID  . carvedilol  6.25 mg Oral BID WC  . clopidogrel  75 mg Oral Daily  . enoxaparin (LOVENOX) injection  40 mg Subcutaneous QHS  . escitalopram  5 mg Oral Daily  . feeding supplement (ENSURE ENLIVE)  237 mL Oral Q24H  . levothyroxine  50 mcg Oral Q0600  . loratadine  10 mg Oral Daily  . mouth rinse  15 mL Mouth Rinse BID  . Netarsudil-Latanoprost  1 drop Ophthalmic QHS  . pantoprazole  40 mg Oral BID  . predniSONE  10 mg Oral Q breakfast   Continuous Infusions: . ampicillin-sulbactam (UNASYN) IV 3 g (07/06/19 1034)       LOS: 4 days      Osvaldo Shipper,  [facility]  07/06/2019, 12:14 PM    If 7PM-7AM, please contact night-coverage. How to contact the [facility] Attending or Consulting provider 7A - 7P or covering provider during after hours 7P -7A, for this patient?    1. Check the care team in [facility] and look for a) attending/consulting TR[facility] provider listed and b) the [facility] team listed 2. Log into [email or web address] and use [place]'s universal password to access. If you do not have the password, please contact the hospital operator. 3. Locate the [facility] provider you are looking for under [facility] and page to a number that you can be directly reached. 4. If you still have difficulty reaching the provider, please page the [facility] (Director on Call) for the  Hospitalists listed on amion for assistance.

## 2019-07-06 NOTE — Progress Notes (Signed)
Physical Therapy Treatment Patient Details Name: Sarah [name] MRN: [id number] DOB: 24-Oct-1921 Today's Date: 07/06/2019   SATURATION QUALIFICATIONS: (This note is used to comply with regulatory documentation for home oxygen)  Patient Saturations on Room Air at Rest = 87%  Patient Saturations on [employer] while Ambulating = 83%  Patient Saturations on 2 Liters of oxygen while Ambulating = 95%     History of Present Illness 86 yo female admitted with acute respiratory failure 2* Pna vs bronchiectasis. Hx of CVA, NSTEMI, depression, osteopenia, CAD    PT Comments    Progressing with mobility.    Follow Up Recommendations  Home health PT;Supervision/Assistance - 24 hour     Equipment Recommendations  None recommended by PT    Recommendations for Other Services       Precautions / Restrictions Precautions Precautions: Fall Precaution Comments: monitor O2 Restrictions Weight Bearing Restrictions: No    Mobility  Bed Mobility Overal bed mobility: Needs Assistance Bed Mobility: Supine to Sit;Sit to Supine     Supine to sit: Supervision;HOB elevated Sit to supine: Supervision;HOB elevated   General bed mobility comments: for safety, lines  Transfers Overall transfer level: Needs assistance Equipment used: Rolling walker (2 wheeled) Transfers: Sit to/from Stand Sit to Stand: Min assist Stand pivot transfers: Min assist       General transfer comment: Assist to steady. Cues for safety.  Ambulation/Gait Ambulation/Gait assistance: Min assist Gait Distance (Feet): 125 Feet Assistive device:  (used "hugo" cane from dept) Gait Pattern/deviations: Step-through pattern;Decreased stride length     General Gait Details: Unsteady. Assist to stabilize pt throughout distance.   Stairs             Wheelchair Mobility    Modified Rankin (Stroke Patients Only)       Balance Overall balance assessment: Needs assistance         Standing balance  support: Single extremity supported Standing balance-Leahy Scale: Fair                              Cognition Arousal/Alertness: Awake/alert Behavior During Therapy: WFL for tasks assessed/performed Overall Cognitive Status: Within Functional Limits for tasks assessed                                        Exercises      General Comments        Pertinent Vitals/Pain Pain Assessment: No/denies pain    Home Living                      Prior Function            PT Goals (current goals can now be found in the care plan section) Progress towards PT goals: Progressing toward goals    Frequency    Min 3X/week      PT Plan Current plan remains appropriate    Co-evaluation              AM-PAC PT "6 Clicks" Mobility   Outcome Measure  Help needed turning from your back to your side while in a flat bed without using bedrails?: A Little Help needed moving from lying on your back to sitting on the side of a flat bed without using bedrails?: A Little Help needed moving to and from a bed to a chair (including a  wheelchair)?: A Little Help needed standing up from a chair using your arms (e.g., wheelchair or bedside chair)?: A Little Help needed to walk in hospital room?: A Little Help needed climbing 3-5 steps with a railing? : A Little 6 Click Score: 18    End of Session Equipment Utilized During Treatment: Gait belt;Oxygen Activity Tolerance: Patient tolerated treatment well Patient left: in bed;with call bell/phone within reach;with bed alarm set;with family/visitor present   PT Visit Diagnosis: Unsteadiness on feet (R26.81);Muscle weakness (generalized) (M62.81);Difficulty in walking, not elsewhere classified (R26.2)     Time: 8338-2505 PT Time Calculation (min) (ACUTE ONLY): 29 min  Charges:  $Gait Training: 23-37 mins                         Doreatha Massed, PT Acute Rehabilitation  Office: [phone] Pager:  [phone]

## 2019-07-06 NOTE — Progress Notes (Signed)
Sarah [name], female    DOB: 01-30-1921,    MRN: [id number]   Brief patient profile:  86 y/o F, never smoker, followed by Dr  Delton Coombes in office with dx of bronchiectasis / pseudomonas colonization on rotating abx (omnicef / zmax - as of 12/2018, intermediate to gentamicin, otherwise sensitive) and prednisone 10 mg daily plus vest rx admitted 6/18 with cough productive green sputum, fever to 100.  PCCM asked to eval by daughter who previously worked at [employer] as RT.   Subjective:  Remains on 2L O2  Afebrile  I/O - not recorded  Pt reports she was up all night using the bathroom.  "went a lot"   Objective:     BP (!) 191/92 (BP Location: Right Arm)   Pulse 84   Temp 98.3 F (36.8 C) (Oral)   Resp 17   Ht 5\' 6"  (1.676 m)   Wt 57.4 kg   LMP 01/14/1970   SpO2 95%   BMI 20.42 kg/m   SpO2: 95 % O2 Flow Rate (L/min): 2 L/min   Exam: General: elderly female lying in bed in NAD, daughter at bedside   HEENT: MM pink/moist, good dentition  Neuro: AAOx4, speech clear, MAE  CV: s1s2 rrr, no m/r/g PULM:  Non-labored on 2L [place], lungs bilaterally with scattered wheezing, rhonchi GI: soft, bsx4 active  Extremities: warm/dry, no edema  Skin: thin skin, multiple areas of ecchymosis  Studies:  Chest CT 07/02/19 >> No evidence of acute central, lobar or proximal segmental pulmonary embolism. More distal evaluation may be limited by extensive respiratory motion artifact. Diffusely thickened airways with many of the lung bases appearing fluid-filled likely reflecting features of aspiration, likely chronic given chronic architectural distortion and scarring throughout both lungs. More acute areas of airspace disease in the right lower lobe and upper lobe. Mosaic attenuation may reflect further small airways disease or air trapping. Right heart enlargement and reflux of contrast into the hepatic veins and IVC may reflect elevated right heart pressure/right heart failure. Large hiatal hernia.    Labs   reviewed:   Recent Labs  Lab 07/04/19 0800 07/05/19 0535 07/06/19 0537  HGB 12.3 12.3 13.2  HCT 39.3 38.9 41.8  WBC 14.7* 15.6* 11.1*  PLT 239 254 251   Recent Labs  Lab 07/02/19 1807 07/02/19 1807 07/03/19 0039 07/03/19 0039 07/04/19 0709 07/04/19 0709 07/05/19 0535 07/06/19 0537  NA 137  --  136  --  134*  --  139 140  K 3.7   < > 4.4   < > 3.7   < > 3.8 3.6  CL 96*  --  97*  --  100  --  100 98  CO2 30  --  33*  --  26  --  31 31  GLUCOSE 89  --  93  --  125*  --  121* 81  BUN 14  --  12  --  18  --  24* 15  CREATININE 0.79  --  0.72  --  0.78  --  0.67 0.82  CALCIUM 8.7*  --  8.7*  --  8.2*  --  8.3* 8.3*  MG  --   --   --   --  2.1  --  2.2  --    < > = values in this interval not displayed.   PCT  6/19    < 0.10        Assessment    Acute Bronchiectasis Exacerbation  Hx of  Pseudomonas Colonization  Suspect in setting of possible aspiration but no apparent infiltrate on initial XRAY.  Followed by [name] as outpatient.  Noted hiatal hernia, at risk for aspiration not directly related to eating.  -continue unasyn, D5 abx -sputum culture sent 6/22  -follow up MBS / SLP evaluation, appreciate input  -continue pulmonary hygiene - IS, mobilize, flutter valve, chest PT -await sputum culture, low threshold if new fever / worsening clinical status to change to pseudomonal coverage -continue home prednisone, 10 mg QD -follow intermittent CXR   -follow up appt arranged as outpatient  Acute Hypoxemic Respiratory Failure In setting of known bronchiectasis, possible right heart involvement noted on CT chest -wean O2 for sats >90% -will need ambulatory O2 needs assessment prior to discharge   [place] -DNR      Noe Gens, MSN, NP-C [facility] 07/06/2019, 11:51 AM   Please see [email or web address] for pager details.

## 2019-07-07 LAB — CULTURE, BLOOD (ROUTINE X 2)
Culture: NO GROWTH
Culture: NO GROWTH
Special Requests: ADEQUATE
Special Requests: ADEQUATE

## 2019-07-07 LAB — CBC WITH DIFFERENTIAL/PLATELET
Abs Immature Granulocytes: 0.07 10*3/uL (ref 0.00–0.07)
Basophils Absolute: 0 10*3/uL (ref 0.0–0.1)
Basophils Relative: 0 %
Eosinophils Absolute: 0.1 10*3/uL (ref 0.0–0.5)
Eosinophils Relative: 1 %
HCT: 42.5 % (ref 36.0–46.0)
Hemoglobin: 13.3 g/dL (ref 12.0–15.0)
Immature Granulocytes: 1 %
Lymphocytes Relative: 14 %
Lymphs Abs: 1.4 10*3/uL (ref 0.7–4.0)
MCH: 32.3 pg (ref 26.0–34.0)
MCHC: 31.3 g/dL (ref 30.0–36.0)
MCV: 103.2 fL — ABNORMAL HIGH (ref 80.0–100.0)
Monocytes Absolute: 0.9 10*3/uL (ref 0.1–1.0)
Monocytes Relative: 9 %
Neutro Abs: 7.8 10*3/uL — ABNORMAL HIGH (ref 1.7–7.7)
Neutrophils Relative %: 75 %
Platelets: 269 10*3/uL (ref 150–400)
RBC: 4.12 MIL/uL (ref 3.87–5.11)
RDW: 11.9 % (ref 11.5–15.5)
WBC: 10.3 10*3/uL (ref 4.0–10.5)
nRBC: 0 % (ref 0.0–0.2)

## 2019-07-07 NOTE — Progress Notes (Signed)
Pharmacy Antibiotic Note  Sarah [name] is a 86 y.o. female admitted on 07/02/2019 with Asp. PNA.  Pharmacy has been consulted for Unasyn dosing. Hx Bronchiectasis w/PsA colonization on alternating Omnicef/Azith as outpt   Plan: No change Unasyn 3 gr q8h  Continues Afebrile, WBC decreasing Per CCM: Unasyn reasonable for now though would have low threshold to  change to zosyn due to h/o pseudomonas if spikes thru unasyn or clinically deteriorates.    Height: 5\' 6"  (167.6 cm) Weight: 57.4 kg (126 lb 8.7 oz) IBW/kg (Calculated) : 59.3  Temp (24hrs), Avg:98 F (36.7 C), Min:97.8 F (36.6 C), Max:98.2 F (36.8 C)  Recent Labs  Lab 07/02/19 1807 07/02/19 1807 07/02/19 1910 07/03/19 0039 07/04/19 0709 07/04/19 0800 07/05/19 0535 07/06/19 0537 07/07/19 0728  WBC 15.8*   < >  --  15.1*  --  14.7* 15.6* 11.1* 10.3  CREATININE 0.79  --   --  0.72 0.78  --  0.67 0.82  --   LATICACIDVEN  --   --  1.1 1.3  --   --   --   --   --    < > = values in this interval not displayed.    Estimated Creatinine Clearance: 36.4 mL/min (by C-G formula based on SCr of 0.82 mg/dL).    Allergies  Allergen Reactions  . Codeine Anaphylaxis  . Hydrocodone Nausea And Vomiting and Other (See Comments)    SYNCOPE AND BRADYCARDIA  . Isosorbide Nitrate Other (See Comments)    BRADYCARDIA  . Oxycodone Palpitations    Rapid heart beat  . Clarithromycin Nausea And Vomiting    Has patient had a PCN reaction causing immediate rash, facial/tongue/throat swelling, SOB or lightheadedness with hypotension: Unknown Has patient had a PCN reaction causing severe rash involving mucus membranes or skin necrosis: Unknown Has patient had a PCN reaction that required hospitalization: Unknown Has patient had a PCN reaction occurring within the last 10 years: Unknown If all of the above answers are "NO", then may proceed with Cephalosporin use.   . Fiorinal [Butalbital-Aspirin-Caffeine] Swelling and Other (See  Comments)    Facial swelling   . Levofloxacin Nausea And Vomiting and Other (See Comments)    SYNCOPE ALSO  . Augmentin [Amoxicillin-Pot Clavulanate] Diarrhea  . Doxycycline     Sweats and aches   Thank you for allowing pharmacy to be a part of this patient's care.  07/09/19 PharmD 07/07/2019, 12:07 PM

## 2019-07-07 NOTE — Progress Notes (Addendum)
PROGRESS NOTE    Sarah [name]   TKW:[id number]  DOB: [birth year]  PCP: [name], MD    DOA: 07/02/2019 LOS: 5   Brief Narrative   Sarah [name] is a 86 y.o. female with medical history significant for Hx of bronchiectasis on chronic steroids and rotates azithromycin/Omnicef monthly (follows with Dr. Delton Coombes), CAD s/p NSTEMI, CVA, hypothyroidism, depression, HLD who presented to the ED on 07/02/19 with acute and progressive shortness of breath since the day before.  Associated productive cough worse than baseline, low grade fever and hypoxia.  In the ED, afebrile, mildly tachypneic, requiring 2L and hypertensive up to systolic of 180s.  Lab work notable for leukocytosis of 13.8.  Lactic acid of 1.1.  Normal creatinine of 0.79.  Chest x-ray had no acute abnormalities.  CTA chest was negative for PE but showed diffusely thickened airways with lower airways fluid-filled concerning for aspiration, and more acute areas of airspace opacities in the right upper and lower lobes.  Admitted for further evaluation and management of aspiration pneumonia vs bronchiectasis flare.    Of note, patient was hospitalized in December 2020 for Pseudomonas pneumonia and had to have left pleural effusion requiring thoracentesis and removal of 450 mL of transudative pleural fluid.   Assessment & Plan    Acute hypoxic respiratory failure secondary to aspiration pneumonia vs bronchiectasis flare  On chronic antibiotics for bronchiectasis as outpatient.   Received IV Rocephin and azithromycin in ED.  Procalcitonin < 0.10.  Patient does have history of Pseudomonas pneumonia in Dec 2020.   Patient remains on Unasyn.  Sputum culture growing gram-negative bacteria.  Wait for final identification and sensitivities.   BC is now normal.  Patient states that she is feeling better this morning.  Pulmonology has been following.  PT and OT evaluation. Patient also seen by speech therapy.  Patient and her daughter declined  modified barium swallow at this time. Also on prednisone at home chronically. Continue to mobilize.  Noted to be on only 1 L of oxygen today.  Noted to desaturate yesterday with ambulation.  May need home oxygen.  Essential Hypertension Blood pressure noted to be high at times.  Patient is on carvedilol.   Apparently uses amlodipine only for blood pressures greater than 180 systolic is from her PCP.  Also on hydralazine as needed.    Hx of CAD Stable.  She is asymptomatic  History of CVA Stable.  Continue Plavix  History anxiety Continue Lexapro and Xanax  Hypothyroidism Continue Synthroid  Glaucoma Continue eyedrops brought from home   Patient BMI: Body mass index is 20.42 kg/m.   DVT prophylaxis: enoxaparin (LOVENOX) injection 40 mg Start: 07/02/19 2200   Diet:  Diet Orders (From admission, onward)    Start     Ordered   07/03/19 1152  Diet regular Room service appropriate? Yes; Fluid consistency: Thin  Diet effective now       Question Answer Comment  Room service appropriate? Yes   Fluid consistency: Thin      07/03/19 1151            Code Status: DNR    Subjective 07/07/19    Patient states that she is feeling better this morning.  Less congested.  Less coughing.  No chest pain.  No nausea or vomiting.  Her daughter is at the bedside.   Disposition Plan & Communication   Status is: Inpatient  Remains inpatient appropriate because:IV treatments appropriate due to intensity of illness or inability to take  PO   Dispo:  Patient From: Home  Planned Disposition: Home  Expected discharge date: 07/09/19  Medically stable for discharge: No     Family Communication: Discussed with the daughter who was at the bedside.   Consults, Procedures, Significant Events   Consultants:   Pulmonology  Antimicrobials:   Rocephin and Zithromax in ED 6/18  Unasyn 6/18 >>   Objective   Vitals:   07/06/19 2118 07/06/19 2133 07/07/19 0601 07/07/19  0852  BP:  (!) 139/95 (!) 170/80   Pulse:  74 65   Resp:  16 16   Temp:  97.8 F (36.6 C) 98 F (36.7 C)   TempSrc:  Oral Oral   SpO2: 90% 96% 98% 91%  Weight:      Height:        Intake/Output Summary (Last 24 hours) at 07/07/2019 1241 Last data filed at 07/07/2019 0943 Gross per 24 hour  Intake 180 ml  Output --  Net 180 ml   Filed Weights   07/03/19 0111  Weight: 57.4 kg    Physical Exam:  General appearance: Awake alert.  In no distress Resp: Improved effort.  Not as tachypneic.  No use of accessory muscles.  Continues to have coarse breath sounds with crackles bilaterally.  No wheezing or rhonchi. Cardio: S1-S2 is normal regular.  No S3-S4.  No rubs murmurs or bruit GI: Abdomen is soft.  Nontender nondistended.  Bowel sounds are present normal.  No masses organomegaly Extremities: No edema.  Full range of motion of lower extremities. Neurologic: Alert and oriented x3.  No focal neurological deficits.      Labs   Data Reviewed: I have personally reviewed following labs and imaging studies  CBC: Recent Labs  Lab 07/02/19 1807 07/02/19 1807 07/03/19 0039 07/04/19 0800 07/05/19 0535 07/06/19 0537 07/07/19 0728  WBC 15.8*   < > 15.1* 14.7* 15.6* 11.1* 10.3  NEUTROABS 13.3*  --   --  13.4* 13.7* 8.4* 7.8*  HGB 14.0   < > 13.3 12.3 12.3 13.2 13.3  HCT 44.8   < > 43.3 39.3 38.9 41.8 42.5  MCV 103.9*   < > 105.4* 104.5* 103.5* 103.0* 103.2*  PLT 247   < > 212 239 254 251 269   < > = values in this interval not displayed.   Basic Metabolic Panel: Recent Labs  Lab 07/02/19 1807 07/03/19 0039 07/04/19 0709 07/05/19 0535 07/06/19 0537  NA 137 136 134* 139 140  K 3.7 4.4 3.7 3.8 3.6  CL 96* 97* 100 100 98  CO2 30 33* 26 31 31   GLUCOSE 89 93 125* 121* 81  BUN 14 12 18  24* 15  CREATININE 0.79 0.72 0.78 0.67 0.82  CALCIUM 8.7* 8.7* 8.2* 8.3* 8.3*  MG  --   --  2.1 2.2  --    GFR: Estimated Creatinine Clearance: 36.4 mL/min (by C-G formula based on SCr of  0.82 mg/dL).  Recent Labs  Lab 07/02/19 1910 07/03/19 0039  PROCALCITON  --  <0.10  LATICACIDVEN 1.1 1.3    Recent Results (from the past 240 hour(s))  Culture, blood (routine x 2)     Status: None   Collection Time: 07/02/19  6:07 PM   Specimen: BLOOD LEFT FOREARM  Result Value Ref Range Status   Specimen Description   Final    BLOOD LEFT FOREARM Performed at [facility], [address] [place], [address] [place]    Special Requests   Final  BOTTLES DRAWN AEROBIC AND ANAEROBIC Blood Culture adequate volume Performed at [place] [address], [place], [place] [address]    Culture   Final    NO GROWTH 5 DAYS Performed at [facility], [place] [address], [place], [place] [address]    Report Status 07/07/2019 FINAL  Final  Culture, blood (routine x 2)     Status: None   Collection Time: 07/02/19  6:12 PM   Specimen: BLOOD  Result Value Ref Range Status   Specimen Description   Final    BLOOD SITE NOT SPECIFIED Performed at [place] [address] [place], [place] [address]    Special Requests   Final    BOTTLES DRAWN AEROBIC AND ANAEROBIC Blood Culture adequate volume Performed at [place] [address] [place], [place] [address]    Culture   Final    NO GROWTH 5 DAYS Performed at [facility], [place] [address], [place], [place] [address]    Report Status 07/07/2019 FINAL  Final  SARS Coronavirus 2 by RT PCR (hospital order, performed in [facility] hospital lab) Nasopharyngeal Nasopharyngeal Swab     Status: None   Collection Time: 07/02/19  7:26 PM   Specimen: Nasopharyngeal Swab  Result Value Ref Range Status   SARS Coronavirus 2 NEGATIVE NEGATIVE Final    Comment: (NOTE) SARS-CoV-2 target nucleic acids are NOT DETECTED.  The SARS-CoV-2 RNA is generally detectable in upper and lower respiratory specimens during the acute phase of infection. The lowest concentration of SARS-CoV-2  viral copies this assay can detect is 250 copies / mL. A negative result does not preclude SARS-CoV-2 infection and should not be used as the sole basis for treatment or other patient management decisions.  A negative result may occur with improper specimen collection / handling, submission of specimen other than nasopharyngeal swab, presence of viral mutation(s) within the areas targeted by this assay, and inadequate number of viral copies (<250 copies / mL). A negative result must be combined with clinical observations, patient history, and epidemiological information.  Fact Sheet for Patients:   StrictlyIdeas.no  Fact Sheet for Healthcare Providers: BankingDealers.co.za  This test is not yet approved or  cleared by the [place] FDA and has been authorized for detection and/or diagnosis of SARS-CoV-2 by FDA under an Emergency Use Authorization (EUA).  This EUA will remain in effect (meaning this test can be used) for the duration of the COVID-19 declaration under Section 564(b)(1) of the Act, 21 U.S.C. section 360bbb-3(b)(1), unless the authorization is terminated or revoked sooner.  Performed at [facility], [place] [address], [place], [place] [address]   Expectorated sputum assessment w rflx to resp cult     Status: None   Collection Time: 07/06/19 11:21 AM   Specimen: Expectorated Sputum  Result Value Ref Range Status   Specimen Description EXPECTORATED SPUTUM  Final   Special Requests NONE  Final   Sputum evaluation   Final    THIS SPECIMEN IS ACCEPTABLE FOR SPUTUM CULTURE Performed at [facility], [place] [address] [place], [place] [address]    Report Status 07/06/2019 FINAL  Final  Culture, respiratory     Status: None (Preliminary result)   Collection Time: 07/06/19 11:21 AM  Result Value Ref Range Status   Specimen Description   Final    EXPECTORATED SPUTUM Performed at [place] [address], [place],  [address]  Special Requests   Final    NONE Reflexed from [id number] Performed at [facility], [address], [place], [place] [address]    Gram Stain   Final    RARE WBC PRESENT, PREDOMINANTLY PMN FEW GRAM NEGATIVE RODS FEW YEAST    Culture   Final    CULTURE REINCUBATED FOR BETTER GROWTH Performed at [facility], [address] [place], [place] [address]    Report Status PENDING  Incomplete      Imaging Studies   DG CHEST PORT 1 VIEW  Result Date: 07/05/2019 CLINICAL DATA:  86 year old female with bronchiectasis. EXAM: PORTABLE CHEST 1 VIEW COMPARISON:  Chest CT dated 07/02/2019 and chest radiograph dated 07/02/2019. FINDINGS: Right upper lobe nodular density as well as smaller densities in the left upper lobe. There is a patchy area of airspace opacity in the right infrahilar region likely corresponding to the density seen on the prior CT in the right lower lobe. Left lung base opacity appears similar to prior radiograph and corresponds to the consolidative changes of the lingula and left lower lobe. Probable small bilateral pleural effusions. No pneumothorax. Stable cardiomediastinal silhouette. Moderate size hiatal hernia. No acute osseous pathology. IMPRESSION: 1. Bilateral upper lobe and lower lobe consolidative changes as seen on the prior CT. 2. Probable small bilateral pleural effusions.  No pneumothorax. Electronically Signed   By: Elgie Collard M.D.   On: 07/05/2019 16:17     Medications   Scheduled Meds: . albuterol  2.5 mg Nebulization BID  . ALPRAZolam  0.5 mg Oral BID  . Brinzolamide-Brimonidine  1 drop Left Eye TID  . carvedilol  6.25 mg Oral BID WC  . clopidogrel  75 mg Oral Daily  . enoxaparin (LOVENOX) injection  40 mg Subcutaneous QHS  . escitalopram  5 mg Oral Daily  . feeding supplement (ENSURE ENLIVE)  237 mL Oral Q24H  . levothyroxine  50 mcg Oral Q0600  .  loratadine  10 mg Oral Daily  . mouth rinse  15 mL Mouth Rinse BID  . Netarsudil-Latanoprost  1 drop Ophthalmic QHS  . pantoprazole  40 mg Oral BID  . predniSONE  10 mg Oral Q breakfast   Continuous Infusions: . ampicillin-sulbactam (UNASYN) IV 3 g (07/07/19 0943)       LOS: 5 days      Osvaldo Shipper,  [facility]  07/07/2019, 12:41 PM    If 7PM-7AM, please contact night-coverage. How to contact the [facility] Attending or Consulting provider 7A - 7P or covering provider during after hours 7P -7A, for this patient?    1. Check the care team in [facility] and look for a) attending/consulting TR[facility] provider listed and b) the [facility] team listed 2. Log into [email or web address] and use [place]'s universal password to access. If you do not have the password, please contact the hospital operator. 3. Locate the [facility] provider you are looking for under [facility] and page to a number that you can be directly reached. 4. If you still have difficulty reaching the provider, please page the [facility] (Director on Call) for the Hospitalists listed on amion for assistance.

## 2019-07-07 NOTE — Progress Notes (Signed)
Sarah [name], female    DOB: October 28, 1921,    MRN: [id number]   Brief patient profile:  86 y/o F, never smoker, followed by Dr  Lamonte Sakai in office with dx of bronchiectasis / pseudomonas colonization on rotating abx (omnicef / zmax - as of 12/2018, intermediate to gentamicin, otherwise sensitive) and prednisone 10 mg daily plus vest rx admitted 6/18 with cough productive green sputum, fever to 100.  PCCM asked to eval by daughter who previously worked at [employer] as RT.   Subjective:   Has a good day today. Breathing better but not back to baseline.    Objective:     BP (!) 170/80 (BP Location: Right Arm)    Pulse 65    Temp 98 F (36.7 C) (Oral)    Resp 16    Ht 5\' 6"  (1.676 m)    Wt 57.4 kg    LMP 01/14/1970    SpO2 91% Comment: pt was on RA o2 sats at this time 91%  placed back on 1 lpm post neb tx   BMI 20.42 kg/m   SpO2: 91 % (pt was on RA o2 sats at this time 91%  placed back on 1 lpm post neb tx) O2 Flow Rate (L/min): 2 L/min   Exam: Gen:      Elderly HEENT:  EOMI, sclera anicteric Neck:     No masses; no thyromegaly Lungs:    Scattered rhonchi CV:         Regular rate and rhythm; no murmurs Abd:      + bowel sounds; soft, non-tender; no palpable masses, no distension Ext:    No edema; adequate peripheral perfusion Skin:      Warm and dry; no rash Neuro: alert and oriented x 3  Studies:  Chest CT 07/02/19 >> No evidence of acute central, lobar or proximal segmental pulmonary embolism. More distal evaluation may be limited by extensive respiratory motion artifact. Diffusely thickened airways with many of the lung bases appearing fluid-filled likely reflecting features of aspiration, likely chronic given chronic architectural distortion and scarring throughout both lungs. More acute areas of airspace disease in the right lower lobe and upper lobe. Mosaic attenuation may reflect further small airways disease or air trapping. Right heart enlargement and reflux of contrast into the hepatic  veins and IVC may reflect elevated right heart pressure/right heart failure. Large hiatal hernia.   Labs  reviewed:   Recent Labs  Lab 07/05/19 0535 07/06/19 0537 07/07/19 0728  HGB 12.3 13.2 13.3  HCT 38.9 41.8 42.5  WBC 15.6* 11.1* 10.3  PLT 254 251 269   Recent Labs  Lab 07/02/19 1807 07/02/19 1807 07/03/19 0039 07/03/19 0039 07/04/19 0709 07/04/19 0709 07/05/19 0535 07/06/19 0537  NA 137  --  136  --  134*  --  139 140  K 3.7   < > 4.4   < > 3.7   < > 3.8 3.6  CL 96*  --  97*  --  100  --  100 98  CO2 30  --  33*  --  26  --  31 31  GLUCOSE 89  --  93  --  125*  --  121* 81  BUN 14  --  12  --  18  --  24* 15  CREATININE 0.79  --  0.72  --  0.78  --  0.67 0.82  CALCIUM 8.7*  --  8.7*  --  8.2*  --  8.3* 8.3*  MG  --   --   --   --  2.1  --  2.2  --    < > = values in this interval not displayed.   PCT  6/19    < 0.10        Assessment    Acute Bronchiectasis Exacerbation  Hx of Pseudomonas Colonization  Suspect in setting of possible aspiration but no apparent infiltrate on initial XRAY.  Followed by Dr. Delton Coombes as outpatient.  Noted hiatal hernia, at risk for aspiration not directly related to eating.  - Continue unasyn - Continue pulmonary hygiene - IS, mobilize, flutter valve, chest PT -- Follow sputum cultures to completion, low threshold if new fever / worsening clinical status to change to pseudomonal coverage -continue home prednisone, 10 mg QD -follow intermittent CXR   -follow up appt arranged as outpatient  Acute Hypoxemic Respiratory Failure In setting of known bronchiectasis, possible right heart involvement noted on CT chest -wean O2 for sats >90% -will need ambulatory O2 needs assessment prior to discharge   GOC -DNR    Daughter updated at bedside PCCM will be available as needed. Please call with questions  Chilton Greathouse MD [facility] Please see [email or web address] for pager details.  07/07/2019, 11:30 AM

## 2019-07-07 NOTE — Progress Notes (Signed)
Subjective: Sarah [name] presents today for follow up of chronic painful corn(s) b/l feet and painful mycotic toenails b/l that are difficult to trim. Pain interferes with ambulation. Aggravating factors include wearing enclosed shoe gear. Pain is relieved with periodic professional debridement.   Her granddaughter is present during today's visit. Ms. Parveen also c/o pain of toes of both feet. Pain described as burning, cold and numbness at times.  Allergies  Allergen Reactions   Codeine Anaphylaxis   Hydrocodone Nausea And Vomiting and Other (See Comments)    SYNCOPE AND BRADYCARDIA   Isosorbide Nitrate Other (See Comments)    BRADYCARDIA   Oxycodone Palpitations    Rapid heart beat   Clarithromycin Nausea And Vomiting    Has patient had a PCN reaction causing immediate rash, facial/tongue/throat swelling, SOB or lightheadedness with hypotension: Unknown Has patient had a PCN reaction causing severe rash involving mucus membranes or skin necrosis: Unknown Has patient had a PCN reaction that required hospitalization: Unknown Has patient had a PCN reaction occurring within the last 10 years: Unknown If all of the above answers are "NO", then may proceed with Cephalosporin use.    Fiorinal [Butalbital-Aspirin-Caffeine] Swelling and Other (See Comments)    Facial swelling    Levofloxacin Nausea And Vomiting and Other (See Comments)    SYNCOPE ALSO   Augmentin [Amoxicillin-Pot Clavulanate] Diarrhea   Doxycycline     Sweats and aches     Objective: There were no vitals filed for this visit.  Pt 86 y.o. year old female  in NAD. AAO x 3.   Vascular Examination:  Capillary fill time to digits <3 seconds b/l lower extremities. Palpable pedal pulses b/l LE. Pedal hair absent. Lower extremity skin temperature gradient warm to cool. No ischemia or gangrene noted b/l lower extremities.  Dermatological Examination: Pedal skin is thin shiny, atrophic bilaterally. No interdigital  macerations bilaterally. Toenails 1-5 b/l elongated, dystrophic, thickened, crumbly with subungual debris and tenderness to dorsal palpation. Hyperkeratotic lesion(s) L 4th toe and R 5th toe.  No erythema, no edema, no drainage, no flocculence.   Right 3rd digit with hyperkeratotic lesion. No redness, streaking or lymphangitis noted.  No probing to bone, no undermining, no active pus or purulence noted.  No deep abscess, no erythema, no edema, no cellulitis, no odor was encountered.    Musculoskeletal: Normal muscle strength 5/5 to all lower extremity muscle groups bilaterally, no pain crepitus or joint limitation noted with ROM b/l and hammertoes noted to the  2-5 bilaterally.  Neurological: Protective sensation intact 5/5 intact bilaterally with 10g monofilament b/l.  Assessment: 1. Pain due to onychomycosis of toenails of both feet   2. Corns   3. Neuropathic pain   4. Pain in toes of both feet    Plan: -Discussed neuropathic pain with Ms. Segel and her granddaughter. Discussed compounded topical neuropathy cream from [place]. Gave granddaughter written ingredients for compounded topical neuropathy cream for [place] for her Mom and family to review. If Ms. Wimbush's daughter would like to proceed with neuropathy cream,  she will call us and let us know.  -Toenails 1-5 b/l were debrided in length and girth with sterile nail nippers and dremel without iatrogenic bleeding.  -Corn(s) debrided right 3rd digit, L 4th toe and R 5th toe without incident. Total number debrided=3.  -Patient to continue soft, supportive shoe gear daily. -Patient to report any pedal injuries to medical professional immediately. -Patient/POA to call should there be question/concern in the interim.  Return in  about 9 weeks (around 08/31/2019) for nail and callus trim/ Plavix.

## 2019-07-07 NOTE — Progress Notes (Signed)
Speech Language Pathology Treatment: Dysphagia  Patient Details Name: Sarah [name] MRN: [id number] DOB: 1921/11/10 Today's Date: 07/07/2019 Time: 1340-1350 SLP Time Calculation (min) (ACUTE ONLY): 10 min  Assessment / Plan / Recommendation Clinical Impression  Pt educated to dysphagia mitigation strategies including starting meals with liquids, consuming medication with puree and avoiding mixed consistencies or dry and crumbly food.  In addition, advised pt to maintain strength of cough/hock to facilitate airway clearance.  Discussed 3 pillars of asp pna including dysphagia, condition of dentition and immune status - and that aspiration does not equate pna but is a risk factor.  Sarah [name] if very astute and has very good recall of medical hx and condition provided education in writing and reviewed verbally - small frequent meals, water during meals, stay upright at least 30 minutes after *to which she stated "what?". SLP will sign off as all education completed with pt.    HPI HPI: 86 y.o. female with medical history significant for Hx of bronchiectasis on chronic steroids and rotates azithromycin/Omnicef monthly, CAD s/p NSTEMI, CVA, hypothyroidism, depression, HLD who presents with increasing shortness of breath since yesterday.  Pt underwent BSE on Saturday 6/19 and daughter desired pt to have MBS but pt was possibly to dc and MBS deferred.  Pt and daughter decided MBS was not desired, therefore SlP follow up for final education re: aspiration, dysphagia mitigation strategies.      SLP Plan  All goals met (6/22 planned)       Recommendations  Diet recommendations: Regular;Thin liquid Liquids provided via: Cup;Straw Medication Administration: Whole meds with puree Supervision: Patient able to self feed Compensations: Slow rate;Small sips/bites Postural Changes and/or Swallow Maneuvers: Seated upright 90 degrees;Upright 30-60 min after meal                Oral Care  Recommendations: Oral care BID Follow up Recommendations: Other (comment) (MBS) SLP Visit Diagnosis: Dysphagia, pharyngoesophageal phase (R13.14) Plan: All goals met (6/22 planned)       GO                Sarah [name] 07/07/2019, 2:49 PM Kathleen Lime, Sarah [facility] SLP Acute Rehab Services Office [phone]

## 2019-07-08 LAB — CULTURE, RESPIRATORY W GRAM STAIN: Culture: NORMAL

## 2019-07-08 MED ORDER — AMOXICILLIN-POT CLAVULANATE 875-125 MG PO TABS
1.0000 | ORAL_TABLET | Freq: Two times a day (BID) | ORAL | Status: DC
  Administered 2019-07-08 – 2019-07-09 (×2): 1 via ORAL
  Filled 2019-07-08 (×3): qty 1

## 2019-07-08 MED ORDER — AMLODIPINE BESYLATE 5 MG PO TABS
5.0000 mg | ORAL_TABLET | Freq: Every day | ORAL | Status: DC
  Administered 2019-07-08 – 2019-07-09 (×2): 5 mg via ORAL
  Filled 2019-07-08 (×2): qty 1

## 2019-07-08 MED ORDER — SACCHAROMYCES BOULARDII 250 MG PO CAPS
250.0000 mg | ORAL_CAPSULE | Freq: Two times a day (BID) | ORAL | Status: DC
  Administered 2019-07-08 – 2019-07-09 (×3): 250 mg via ORAL
  Filled 2019-07-08 (×3): qty 1

## 2019-07-08 MED ORDER — HYDRALAZINE HCL 20 MG/ML IJ SOLN
5.0000 mg | Freq: Four times a day (QID) | INTRAMUSCULAR | Status: DC | PRN
  Administered 2019-07-08 (×2): 5 mg via INTRAVENOUS
  Filled 2019-07-08 (×2): qty 1

## 2019-07-08 NOTE — Progress Notes (Signed)
Patient on room air and assisted to bathroom. O2 sat 92% before ambulating to bathroom. After returning to bed and brief coughing to expel sputum, O2 sat 83%. O2 applied at 2L via [place]. O2 sat improved to 96%, sputum expelled, and O2 removed. Room air O2 sat at rest 92%. No complaints from patient. Daughter at bedside.

## 2019-07-08 NOTE — Progress Notes (Signed)
Nutrition Follow-up  INTERVENTION:   -Ensure Enlive po daily, each supplement provides 350 kcal and 20 grams of protein  NUTRITION DIAGNOSIS:   Increased nutrient needs related to catabolic illness (bronchiectasis) as evidenced by estimated needs.  Ongoing.  GOAL:   Patient will meet greater than or equal to 90% of their needs  Progressing.  MONITOR:   PO intake, Supplement acceptance, Labs, Weight trends, I & O's  ASSESSMENT:   86 year old female with PMHx of bronchiectasis on chronic steroids and rotates azithromycin/Omnicef monthly, HTN, diastolic dysfunction, idiopathic peripheral neuropathy, hypothyroidism, anxiety, depression, hx hiatal hernia, hx NSTEMI, asthma, GERD, arthritis, hx CVA 2016 admitted with acute hypoxic respiratory failure secondary to presumed PNA vs bronchiectasis flare.   **RD working remotely**  Pt currently consuming 75-95% of meals at this time. Per SLP note 6/23, pt continues regular diet. Pt had declined MBS this admission. Pt is still receiving 1 Ensure daily.  Admission weight: 126 lbs. No new weights measured for this admission.  Medications: Florastor, Senokot Labs reviewed: CBGs: 111  Diet Order:   Diet Order            Diet regular Room service appropriate? Yes; Fluid consistency: Thin  Diet effective now                 EDUCATION NEEDS:   No education needs have been identified at this time  Skin:  Skin Assessment: Reviewed RN Assessment  Last BM:  6/21  Height:   Ht Readings from Last 1 Encounters:  07/03/19 5\' 6"  (1.676 m)    Weight:   Wt Readings from Last 1 Encounters:  07/03/19 57.4 kg    Ideal Body Weight:  59.1 kg  BMI:  Body mass index is 20.42 kg/m.  Estimated Nutritional Needs:   Kcal:  1500-1700  Protein:  75-85 grams  Fluid:  1.5 L/day  07/05/19, MS, RD, LDN Inpatient Clinical Dietitian Contact information available via Amion

## 2019-07-08 NOTE — Care Management Important Message (Signed)
Important Message  Patient Details IM Letter given to Sandford Craze RN Case Manager to present to the Patient Name: Aija Scarfo MRN: [id number] Date of Birth: [birth year]   Medicare Important Message Given:  Yes     Caren Macadam 07/08/2019, 9:50 AM

## 2019-07-08 NOTE — Progress Notes (Addendum)
PROGRESS NOTE    Sarah [name]   EQA:[id number]  DOB: 1921-02-01  PCP: [name], MD    DOA: 07/02/2019 LOS: 6   Brief Narrative   Sarah [name] is a 86 y.o. female with medical history significant for Hx of bronchiectasis on chronic steroids and rotates azithromycin/Omnicef monthly (follows with Dr. Delton Coombes), CAD s/p NSTEMI, CVA, hypothyroidism, depression, HLD who presented to the ED on 07/02/19 with acute and progressive shortness of breath since the day before.  Associated productive cough worse than baseline, low grade fever and hypoxia.  In the ED, afebrile, mildly tachypneic, requiring 2L and hypertensive up to systolic of 180s.  Lab work notable for leukocytosis of 13.8.  Lactic acid of 1.1.  Normal creatinine of 0.79.  Chest x-ray had no acute abnormalities.  CTA chest was negative for PE but showed diffusely thickened airways with lower airways fluid-filled concerning for aspiration, and more acute areas of airspace opacities in the right upper and lower lobes.  Admitted for further evaluation and management of aspiration pneumonia vs bronchiectasis flare.    Of note, patient was hospitalized in December 2020 for Pseudomonas pneumonia and had to have left pleural effusion requiring thoracentesis and removal of 450 mL of transudative pleural fluid.   Assessment & Plan    Acute hypoxic respiratory failure secondary to aspiration pneumonia vs bronchiectasis flare  On chronic antibiotics for bronchiectasis as outpatient.   Received IV Rocephin and azithromycin in ED.  Procalcitonin < 0.10.  Patient does have history of Pseudomonas pneumonia in Dec 2020.  Patient was started on Unasyn on admission. Patient's respiratory symptoms have been very slow to improve.   WBC is noted to be normal.  Pulmonology has been following as well.  PT and OT evaluation.  Patient also seen by speech therapy.  Patient and daughter declined modified barium swallow. Sputum was growing gram-negative  bacteria but it looks like final report suggest normal respiratory flora.  Could potentially change her to Augmentin.  We will run it by pulmonology.  Discussed with Dr. Isaiah Serge.  Will change to Augmentin.  Apparently she has an allergic reaction which is listed as diarrhea.  Discussed with patient and her daughter.  No true allergic reaction to penicillin or Augmentin previously.  She has been tolerating Unasyn without any problems.  We will place her on probiotics and start on Augmentin and see how she does in the hospital.  They are agreeable with this plan. Also on prednisone at home which is being continued. Requiring about 1 L of oxygen to maintain saturations in the early 90s. Noted to desaturate with ambulation.  May need home oxygen.  Essential Hypertension Blood pressure noted to be high at times.  Patient is on carvedilol.   Apparently uses amlodipine only for blood pressures greater than 180 systolic is from her PCP.  Also on hydralazine as needed.  We will change parameters and give hydralazine if systolic blood pressures greater than 170.  Hx of CAD Stable.  She is asymptomatic  History of CVA Stable.  Continue Plavix  History anxiety Continue Lexapro and Xanax  Hypothyroidism Continue Synthroid  Glaucoma Continue eyedrops brought from home   Patient BMI: Body mass index is 20.42 kg/m.   DVT prophylaxis: enoxaparin (LOVENOX) injection 40 mg Start: 07/02/19 2200   Diet:  Diet Orders (From admission, onward)    Start     Ordered   07/03/19 1152  Diet regular Room service appropriate? Yes; Fluid consistency: Thin  Diet effective now  Question Answer Comment  Room service appropriate? Yes   Fluid consistency: Thin      07/03/19 1151            Code Status: DNR    Subjective 07/08/19    Patient states that when she woke up she was feeling poorly but feels better now.  Otherwise no new symptoms.  Continues to have cough though it is less than before.   Not as congested.  No nausea vomiting.     Disposition Plan & Communication   Status is: Inpatient  Remains inpatient appropriate because:IV treatments appropriate due to intensity of illness or inability to take PO   Dispo:  Patient From: Home  Planned Disposition: Home  Expected discharge date: 07/09/19  Medically stable for discharge: No     Family Communication: Discussed with the daughter who was at the bedside.   Consults, Procedures, Significant Events   Consultants:   Pulmonology  Antimicrobials:   Rocephin and Zithromax in ED 6/18  Unasyn 6/18 >>   Objective   Vitals:   07/08/19 0625 07/08/19 0627 07/08/19 0834 07/08/19 1035  BP: (!) 178/80  (!) 179/102 (!) 178/92  Pulse: 69 69 85 86  Resp: 19     Temp: 97.8 F (36.6 C)     TempSrc: Oral     SpO2: (!) 88% 92%    Weight:      Height:        Intake/Output Summary (Last 24 hours) at 07/08/2019 1057 Last data filed at 07/07/2019 1800 Gross per 24 hour  Intake 680 ml  Output --  Net 680 ml   Filed Weights   07/03/19 0111  Weight: 57.4 kg    Physical Exam:  General appearance: Awake alert.  In no distress Resp: Improved effort.  Not as tachypneic.  Continues to have coarse breath sounds with crackles bilaterally.  No wheezing or rhonchi.   Cardio: S1-S2 is normal regular.  No S3-S4.  No rubs murmurs or bruit GI: Abdomen is soft.  Nontender nondistended.  Bowel sounds are present normal.  No masses organomegaly Extremities: No edema.  Full range of motion of lower extremities. Neurologic: Alert and oriented x3.  No focal neurological deficits.     Labs   Data Reviewed: I have personally reviewed following labs and imaging studies  CBC: Recent Labs  Lab 07/02/19 1807 07/02/19 1807 07/03/19 0039 07/04/19 0800 07/05/19 0535 07/06/19 0537 07/07/19 0728  WBC 15.8*   < > 15.1* 14.7* 15.6* 11.1* 10.3  NEUTROABS 13.3*  --   --  13.4* 13.7* 8.4* 7.8*  HGB 14.0   < > 13.3 12.3 12.3 13.2  13.3  HCT 44.8   < > 43.3 39.3 38.9 41.8 42.5  MCV 103.9*   < > 105.4* 104.5* 103.5* 103.0* 103.2*  PLT 247   < > 212 239 254 251 269   < > = values in this interval not displayed.   Basic Metabolic Panel: Recent Labs  Lab 07/02/19 1807 07/03/19 0039 07/04/19 0709 07/05/19 0535 07/06/19 0537  NA 137 136 134* 139 140  K 3.7 4.4 3.7 3.8 3.6  CL 96* 97* 100 100 98  CO2 30 33* 26 31 31   GLUCOSE 89 93 125* 121* 81  BUN 14 12 18  24* 15  CREATININE 0.79 0.72 0.78 0.67 0.82  CALCIUM 8.7* 8.7* 8.2* 8.3* 8.3*  MG  --   --  2.1 2.2  --    GFR: Estimated Creatinine Clearance: 36.4 mL/min (by  C-G formula based on SCr of 0.82 mg/dL).  Recent Labs  Lab 07/02/19 1910 07/03/19 0039  PROCALCITON  --  <0.10  LATICACIDVEN 1.1 1.3    Recent Results (from the past 240 hour(s))  Culture, blood (routine x 2)     Status: None   Collection Time: 07/02/19  6:07 PM   Specimen: BLOOD LEFT FOREARM  Result Value Ref Range Status   Specimen Description   Final    BLOOD LEFT FOREARM Performed at [facility], [address] [place], [place] [address]    Special Requests   Final    BOTTLES DRAWN AEROBIC AND ANAEROBIC Blood Culture adequate volume Performed at [facility], [address] [place], [place] [address]    Culture   Final    NO GROWTH 5 DAYS Performed at [facility], [address], [place], [place] [address]    Report Status 07/07/2019 FINAL  Final  Culture, blood (routine x 2)     Status: None   Collection Time: 07/02/19  6:12 PM   Specimen: BLOOD  Result Value Ref Range Status   Specimen Description   Final    BLOOD SITE NOT SPECIFIED Performed at [facility], [address], [place], [place] [address]    Special Requests   Final    BOTTLES DRAWN AEROBIC AND ANAEROBIC Blood Culture adequate volume Performed at [facility], [address] [place], [place] [address]    Culture   Final    NO GROWTH 5  DAYS Performed at [facility], [address], [place], [place] [address]    Report Status 07/07/2019 FINAL  Final  SARS Coronavirus 2 by RT PCR (hospital order, performed in [facility] hospital lab) Nasopharyngeal Nasopharyngeal Swab     Status: None   Collection Time: 07/02/19  7:26 PM   Specimen: Nasopharyngeal Swab  Result Value Ref Range Status   SARS Coronavirus 2 NEGATIVE NEGATIVE Final    Comment: (NOTE) SARS-CoV-2 target nucleic acids are NOT DETECTED.  The SARS-CoV-2 RNA is generally detectable in upper and lower respiratory specimens during the acute phase of infection. The lowest concentration of SARS-CoV-2 viral copies this assay can detect is 250 copies / mL. A negative result does not preclude SARS-CoV-2 infection and should not be used as the sole basis for treatment or other patient management decisions.  A negative result may occur with improper specimen collection / handling, submission of specimen other than nasopharyngeal swab, presence of viral mutation(s) within the areas targeted by this assay, and inadequate number of viral copies (<250 copies / mL). A negative result must be combined with clinical observations, patient history, and epidemiological information.  Fact Sheet for Patients:   BoilerBrush.com.cy  Fact Sheet for Healthcare Providers: https://pope.com/  This test is not yet approved or  cleared by the [place] FDA and has been authorized for detection and/or diagnosis of SARS-CoV-2 by FDA under an Emergency Use Authorization (EUA).  This EUA will remain in effect (meaning this test can be used) for the duration of the COVID-19 declaration under Section 564(b)(1) of the Act, 21 U.S.C. section 360bbb-3(b)(1), unless the authorization is terminated or revoked sooner.  Performed at [facility], [address], [place], [place] [address]   Expectorated sputum  assessment w rflx to resp cult     Status: None   Collection Time: 07/06/19 11:21 AM   Specimen: Expectorated Sputum  Result Value Ref  Range Status   Specimen Description EXPECTORATED SPUTUM  Final   Special Requests NONE  Final   Sputum evaluation   Final    THIS SPECIMEN IS ACCEPTABLE FOR SPUTUM CULTURE Performed at [facility], [place] [address], [place], [place] [address]    Report Status 07/06/2019 FINAL  Final  Culture, respiratory     Status: None   Collection Time: 07/06/19 11:21 AM  Result Value Ref Range Status   Specimen Description   Final    EXPECTORATED SPUTUM Performed at [place] [address], [place], [place] [address]    Special Requests   Final    NONE Reflexed from [id number] Performed at [facility], [place] [address] [place], [place] [address]    Gram Stain   Final    RARE WBC PRESENT, PREDOMINANTLY PMN FEW GRAM NEGATIVE RODS FEW YEAST    Culture   Final    Consistent with normal respiratory flora. Performed at [facility], [place] [address], [place], [place] [address]    Report Status 07/08/2019 FINAL  Final      Imaging Studies   No results found.   Medications   Scheduled Meds: . albuterol  2.5 mg Nebulization BID  . ALPRAZolam  0.5 mg Oral BID  . Brinzolamide-Brimonidine  1 drop Left Eye TID  . carvedilol  6.25 mg Oral BID WC  . clopidogrel  75 mg Oral Daily  . enoxaparin (LOVENOX) injection  40 mg Subcutaneous QHS  . escitalopram  5 mg Oral Daily  . feeding supplement (ENSURE ENLIVE)  237 mL Oral Q24H  . levothyroxine  50 mcg Oral Q0600  . loratadine  10 mg Oral Daily  . mouth rinse  15 mL Mouth Rinse BID  . Netarsudil-Latanoprost  1 drop Ophthalmic QHS  . pantoprazole  40 mg Oral BID  . predniSONE  10 mg Oral Q breakfast   Continuous Infusions: . ampicillin-sulbactam (UNASYN) IV 3 g (07/08/19 0247)       LOS: 6 days      Bonnielee Haff,  [facility]  07/08/2019, 10:57 AM    If 7PM-7AM, please contact night-coverage. How to contact the [facility] Attending or Consulting provider [place] or covering provider during after hours [place], for this patient?    1. Check the care team in [facility] and look for a) attending/consulting [facility] provider listed and b) the [facility] team listed 2. Log into [email or web address] and use 's universal password to access. If you do not have the password, please contact the hospital operator. 3. Locate the [facility] provider you are looking for under [facility] and page to a number that you can be directly reached. 4. If you still have difficulty reaching the provider, please page the [facility] (Director on Call) for the Hospitalists listed on amion for assistance.

## 2019-07-08 NOTE — Progress Notes (Signed)
PHYSICAL THERAPY  Pt just got back to bed from bathroom and requested a nap.  "That's all I want right now".  Felecia Shelling  PTA Acute  Rehabilitation Services Pager      [phone] Office      [phone]

## 2019-07-08 NOTE — Progress Notes (Signed)
Occupational Therapy Treatment Patient Details Name: Sarah [name] MRN: [id number] DOB: 23-Mar-1921 Today's Date: 07/08/2019    History of present illness 86 yo female admitted with acute respiratory failure 2* Pna vs bronchiectasis. Hx of CVA, NSTEMI, depression, osteopenia, CAD   OT comments  Patient demonstrated improving activity tolerance for self care tasks. Patient able to ambulate to bathroom, perform toileting, stand at sink for grooming and returned to side of bed on RA. o2 sat dropped to 88% with activity. Therapist providing supervision and min guard with ambulation. Patient did not complain of shortness of breath.   Follow Up Recommendations  Home health OT;Supervision/Assistance - 24 hour    Equipment Recommendations  3 in 1 bedside commode    Recommendations for Other Services      Precautions / Restrictions Precautions Precautions: Fall Precaution Comments: monitor O2 Restrictions Weight Bearing Restrictions: No       Mobility Bed Mobility               General bed mobility comments: supervision for bed transfers.  Transfers                 General transfer comment: CGA for ambulation to bathroom and back to bed. Patient on RA. After toileting, standing at sink and return to bed -o2 sat 88%. Recovered to 91% seated at edge of bed. Returned to 94% on 1 liter.    Balance                                           ADL either performed or assessed with clinical judgement   ADL       Grooming: Wash/dry hands;Wash/dry face;Oral care;Standing Grooming Details (indicate cue type and reason): standing at sink with supervision from therapist. Patient on RA. O2 sat 90%             Lower Body Dressing: Set up;Supervision/safety;Sit to/from stand Lower Body Dressing Details (indicate cue type and reason): patient donned shoes sitting on side of bed with set up. Toilet Transfer: Hydrographic surveyor Details (indicate cue  type and reason): patient ambulated to bathroom on RA without device and cga from therapist. Toileting- Clothing Manipulation and Hygiene: Supervision/safety;Sit to/from stand Toileting - Clothing Manipulation Details (indicate cue type and reason): Patient performed toileting with supervision from therapist.             Vision       Perception     Praxis      Cognition Arousal/Alertness: Awake/alert Behavior During Therapy: WFL for tasks assessed/performed Overall Cognitive Status: Within Functional Limits for tasks assessed                                          Exercises     Shoulder Instructions       General Comments      Pertinent Vitals/ Pain       Pain Assessment: No/denies pain  Home Living                                          Prior Functioning/Environment              Frequency  Min 2X/week        Progress Toward Goals  OT Goals(current goals can now be found in the care plan section)  Progress towards OT goals: Progressing toward goals     Plan Discharge plan remains appropriate    Co-evaluation                 AM-PAC OT "6 Clicks" Daily Activity     Outcome Measure   Help from another person eating meals?: None Help from another person taking care of personal grooming?: A Little Help from another person toileting, which includes using toliet, bedpan, or urinal?: A Little Help from another person bathing (including washing, rinsing, drying)?: A Little Help from another person to put on and taking off regular upper body clothing?: A Little Help from another person to put on and taking off regular lower body clothing?: A Little 6 Click Score: 19    End of Session    OT Visit Diagnosis: Unsteadiness on feet (R26.81);Other abnormalities of gait and mobility (R26.89);Muscle weakness (generalized) (M62.81)   Activity Tolerance Patient tolerated treatment well   Patient Left in bed;with  call bell/phone within reach   Nurse Communication  (okay to see per Nursing)        Time: 1572-6203 OT Time Calculation (min): 24 min  Charges: OT General Charges $OT Visit: 1 Visit OT Treatments $Self Care/Home Management : 23-37 mins  Derl Barrow, OTR/L [place]  Office [phone] Pager: [place] 07/08/2019, 4:57 PM

## 2019-07-09 ENCOUNTER — Ambulatory Visit: Payer: Medicare Other | Admitting: Emergency Medicine

## 2019-07-09 DIAGNOSIS — J470 Bronchiectasis with acute lower respiratory infection: Secondary | ICD-10-CM

## 2019-07-09 LAB — BASIC METABOLIC PANEL
Anion gap: 8 (ref 5–15)
BUN: 12 mg/dL (ref 8–23)
CO2: 32 mmol/L (ref 22–32)
Calcium: 8.4 mg/dL — ABNORMAL LOW (ref 8.9–10.3)
Chloride: 98 mmol/L (ref 98–111)
Creatinine, Ser: 0.72 mg/dL (ref 0.44–1.00)
GFR calc Af Amer: >60 mL/min (ref >60)
GFR calc non Af Amer: >60 mL/min (ref >60)
Glucose, Bld: 96 mg/dL (ref 70–99)
Potassium: 3.1 mmol/L — ABNORMAL LOW (ref 3.5–5.1)
Sodium: 138 mmol/L (ref 135–145)

## 2019-07-09 LAB — CBC
HCT: 43.7 % (ref 36.0–46.0)
Hemoglobin: 13.9 g/dL (ref 12.0–15.0)
MCH: 32.5 pg (ref 26.0–34.0)
MCHC: 31.8 g/dL (ref 30.0–36.0)
MCV: 102.1 fL — ABNORMAL HIGH (ref 80.0–100.0)
Platelets: 328 10*3/uL (ref 150–400)
RBC: 4.28 MIL/uL (ref 3.87–5.11)
RDW: 12.1 % (ref 11.5–15.5)
WBC: 12.2 10*3/uL — ABNORMAL HIGH (ref 4.0–10.5)
nRBC: 0 % (ref 0.0–0.2)

## 2019-07-09 MED ORDER — POTASSIUM CHLORIDE CRYS ER 20 MEQ PO TBCR
40.0000 meq | EXTENDED_RELEASE_TABLET | ORAL | Status: DC
  Administered 2019-07-09: 40 meq via ORAL
  Filled 2019-07-09 (×2): qty 2

## 2019-07-09 MED ORDER — AMOXICILLIN-POT CLAVULANATE 875-125 MG PO TABS: 1.0000 | ORAL_TABLET | Freq: Two times a day (BID) | ORAL | 0 refills | Status: AC

## 2019-07-09 MED ORDER — SACCHAROMYCES BOULARDII 250 MG PO CAPS: 250.0000 mg | ORAL_CAPSULE | Freq: Two times a day (BID) | ORAL | 0 refills | Status: AC

## 2019-07-09 NOTE — Progress Notes (Signed)
PT Cancellation Note  Patient Details Name: Sholonda Jobst MRN: [id number] DOB: [birth year]   Cancelled Treatment:    Reason Eval/Treat Not Completed: Patient declined, no reason specified (would like to save energy for d/c today)   Esiah Bazinet,KATHrine E 07/09/2019, 10:34 AM Paulino Door, DPT Acute Rehabilitation Services Pager: [phone] Office: [phone]

## 2019-07-09 NOTE — Progress Notes (Signed)
Pt discharged to home. Dc instructions given with daughter Johnny Bridge) at bedside. No concerns voiced. Dtr reminded to stop by [facility] pharmacy and pick up medications that were e-prescribed by MD. Dtr voiced understanding. Pt given 1200 dose of potassium chloride prior to leaving unit. Pt left unit in wheelchair pushed by nurse tech. Left in stable condition.

## 2019-07-09 NOTE — Progress Notes (Signed)
SATURATION QUALIFICATIONS: (This note is used to comply with regulatory documentation for home oxygen)  Patient Saturations on Room Air at Rest = 93%  Patient Saturations on Room Air while Ambulating =  (90-91)%  Pt tolerated ambulation with no problems.  VWilliams, Charity fundraiser.

## 2019-07-09 NOTE — Discharge Summary (Signed)
[facility]  Physician Discharge Summary   Patient ID: Sarah [name] MRN: [id number] DOB/AGE: Jun 03, 1921 86 y.o.  Admit date: 07/02/2019 Discharge date: 07/09/2019  PCP: [name], [name], MD  DISCHARGE DIAGNOSES:  Aspiration pneumonia History of bronchiectasis Acute respiratory failure with hypoxia, resolved Essential hypertension Coronary artery disease History of stroke History of anxiety Hypothyroidism Glaucoma  RECOMMENDATIONS FOR OUTPATIENT FOLLOW UP: 1. Follow-up with pulmonology.  Appointments have been made.  Home Health: Home health PT Equipment/Devices: Patient was ambulated and was noted to be saturating above 90% on RA.  CODE STATUS: DNR  DISCHARGE CONDITION: fair  Diet recommendation: As before and as instructed by speech therapy  INITIAL HISTORY: Sarah BirchwoodGertrude Royalis a 86 y.o.femalewith medical history significant forHx of bronchiectasis on chronic steroids and rotates azithromycin/Omnicefmonthly (follows with Dr. Delton CoombesByrum), CAD s/p NSTEMI, CVA, hypothyroidism, depression, HLD who presented to the ED on 07/02/19 with acute and progressive shortness of breath since the day before.  Associated productive coughworse than baseline, low grade fever and hypoxia.  In the ED, afebrile, mildly tachypneic, requiring 2L and hypertensive up to systolic of 180s.  Lab work notable for leukocytosis of 13.8. Lactic acid of 1.1. Normal creatinine of 0.79.  Chest x-ray had no acute abnormalities.CTA chest was negative for PE but showed diffusely thickened airways with lower airways fluid-filled concerning for aspiration, and more acute areas of airspace opacities in the right upper and lower lobes.  Admitted for further evaluation and management of aspiration pneumonia vs bronchiectasis flare.    Of note, patient was hospitalized in December 2020 for Pseudomonas pneumonia and had to have left pleural effusion requiring thoracentesis and removal of 450 mL of  transudativepleural fluid.  Consultations:  Pulmonology    HOSPITAL COURSE:    Acute hypoxic respiratory failure secondary to aspiration pneumoniavs bronchiectasis flare  Patient is on chronic antibiotics for bronchiectasis as outpatient. Azithromycin alternating with Omnicef. ReceivedIV Rocephin and azithromycin in ED.Procalcitonin < 0.10.  Patient does have history of Pseudomonas pneumonia in Dec 2020.  Patient was started on Unasyn on admission. Patient's respiratory symptoms have been very slow to improve.  WBC is noted to be normal.  Pulmonology has been following as well.  PT and OT evaluation.  Patient also seen by speech therapy.  Patient and daughter declined modified barium swallow. Sputum was growing gram-negative bacteria but it looks like final report suggest normal respiratory flora.  She was changed over from Unasyn to Augmentin after discussions with pulmonology. Apparently she has an allergic reaction which is listed as diarrhea.  Discussed with patient and her daughter.  No true allergic reaction to penicillin or Augmentin previously.  She has been tolerating Unasyn without any problems. She tolerated Augmentin without any difficulties.  She will be discharged on the same along with probiotics.   Also on prednisone at home which is being continued. She was initially noted to desaturate with ambulation.  Was thought to require home oxygen.  She was ambulated again today however did not go below 90% on room air.  Does not meet criteria for home oxygen.  Essential Hypertension Blood pressure noted to be high at times.  She may resume her home medication regimen.  Hx of CAD Stable.  She is asymptomatic  History of CVA Stable.  Continue Plavix  History anxiety Continue Lexapro and Xanax  Hypothyroidism Continue Synthroid  Glaucoma Continue eyedrops brought from home  Overall stable.  Okay for discharge home today.     PERTINENT LABS:  The  results of significant diagnostics  from this hospitalization (including imaging, microbiology, ancillary and laboratory) are listed below for reference.    Microbiology: Recent Results (from the past 240 hour(s))  Culture, blood (routine x 2)     Status: None   Collection Time: 07/02/19  6:07 PM   Specimen: BLOOD LEFT FOREARM  Result Value Ref Range Status   Specimen Description   Final    BLOOD LEFT FOREARM Performed at [facility], [address] [place], [place] [address]    Special Requests   Final    BOTTLES DRAWN AEROBIC AND ANAEROBIC Blood Culture adequate volume Performed at [facility], [address] [place], [place] [address]    Culture   Final    NO GROWTH 5 DAYS Performed at [facility], [address] [place], [place] [address]    Report Status 07/07/2019 FINAL  Final  Culture, blood (routine x 2)     Status: None   Collection Time: 07/02/19  6:12 PM   Specimen: BLOOD  Result Value Ref Range Status   Specimen Description   Final    BLOOD SITE NOT SPECIFIED Performed at [facility], [address] [place], [place] [address]    Special Requests   Final    BOTTLES DRAWN AEROBIC AND ANAEROBIC Blood Culture adequate volume Performed at [facility], [address] [place], [place] [address]    Culture   Final    NO GROWTH 5 DAYS Performed at [facility], [address] [place], [place] [address]    Report Status 07/07/2019 FINAL  Final  SARS Coronavirus 2 by RT PCR (hospital order, performed in [facility] hospital lab) Nasopharyngeal Nasopharyngeal Swab     Status: None   Collection Time: 07/02/19  7:26 PM   Specimen: Nasopharyngeal Swab  Result Value Ref Range Status   SARS Coronavirus 2 NEGATIVE NEGATIVE Final    Comment: (NOTE) SARS-CoV-2 target nucleic acids are NOT DETECTED.  The SARS-CoV-2 RNA is generally detectable in upper and lower respiratory specimens during the acute phase of  infection. The lowest concentration of SARS-CoV-2 viral copies this assay can detect is 250 copies / mL. A negative result does not preclude SARS-CoV-2 infection and should not be used as the sole basis for treatment or other patient management decisions.  A negative result may occur with improper specimen collection / handling, submission of specimen other than nasopharyngeal swab, presence of viral mutation(s) within the areas targeted by this assay, and inadequate number of viral copies (<250 copies / mL). A negative result must be combined with clinical observations, patient history, and epidemiological information.  Fact Sheet for Patients:   BoilerBrush.com.cy  Fact Sheet for Healthcare Providers: https://pope.com/  This test is not yet approved or  cleared by the [place] FDA and has been authorized for detection and/or diagnosis of SARS-CoV-2 by FDA under an Emergency Use Authorization (EUA).  This EUA will remain in effect (meaning this test can be used) for the duration of the COVID-19 declaration under Section 564(b)(1) of the Act, 21 U.S.C. section 360bbb-3(b)(1), unless the authorization is terminated or revoked sooner.  Performed at [facility], [address] [place], [place] [address]   Expectorated sputum assessment w rflx to resp cult     Status: None   Collection Time: 07/06/19 11:21 AM   Specimen: Expectorated Sputum  Result Value Ref Range Status   Specimen Description EXPECTORATED SPUTUM  Final   Special  Requests NONE  Final   Sputum evaluation   Final    THIS SPECIMEN IS ACCEPTABLE FOR SPUTUM CULTURE Performed at [facility] [address], [place], [place] [address]    Report Status 07/06/2019 FINAL  Final  Culture, respiratory     Status: None   Collection Time: 07/06/19 11:21 AM  Result Value Ref Range Status   Specimen Description   Final    EXPECTORATED  SPUTUM Performed at [facility] [address] [place], [place] [address]    Special Requests   Final    NONE Reflexed from [phone] Performed at [facility], [place] [address], [place], [place] [address]    Gram Stain   Final    RARE WBC PRESENT, PREDOMINANTLY PMN FEW GRAM NEGATIVE RODS FEW YEAST    Culture   Final    Consistent with normal respiratory flora. Performed at [facility], [place] [address], [place], [place] [address]    Report Status 07/08/2019 FINAL  Final     Labs:  [id number] Labs  No results for input(s): DDIMER, FERRITIN, LDH, CRP in the last 72 hours.  Lab Results  Component Value Date   [place] NEGATIVE 07/02/2019   [place] NEGATIVE 12/16/2018      Basic Metabolic Panel: Recent Labs  Lab 07/03/19 0039 07/04/19 0709 07/05/19 0535 07/06/19 0537 07/09/19 0559  NA 136 134* 139 140 138  K 4.4 3.7 3.8 3.6 3.1*  CL 97* 100 100 98 98  CO2 33* 26 31 31  32  GLUCOSE 93 125* 121* 81 96  BUN 12 18 24* 15 12  CREATININE 0.72 0.78 0.67 0.82 0.72  CALCIUM 8.7* 8.2* 8.3* 8.3* 8.4*  MG  --  2.1 2.2  --   --    CBC: Recent Labs  Lab 07/02/19 1807 07/03/19 0039 07/04/19 0800 07/05/19 0535 07/06/19 0537 07/07/19 0728 07/09/19 0559  WBC 15.8*   < > 14.7* 15.6* 11.1* 10.3 12.2*  NEUTROABS 13.3*  --  13.4* 13.7* 8.4* 7.8*  --   HGB 14.0   < > 12.3 12.3 13.2 13.3 13.9  HCT 44.8   < > 39.3 38.9 41.8 42.5 43.7  MCV 103.9*   < > 104.5* 103.5* 103.0* 103.2* 102.1*  PLT 247   < > 239 254 251 269 328   < > = values in this interval not displayed.   BNP: BNP (last 3 results) Recent Labs    12/16/18 1101 07/02/19 1807  BNP 212.3* 183.9*    CBG: Recent Labs  Lab 07/05/19 2117  GLUCAP 111*     IMAGING STUDIES CT Angio Chest PE W/Cm &/Or Wo Cm  Result Date: 07/02/2019 CLINICAL DATA:  Shortness of breath and productive cough fever since yesterday EXAM: CT ANGIOGRAPHY CHEST WITH CONTRAST TECHNIQUE:  Multidetector CT imaging of the chest was performed using the standard protocol during bolus administration of intravenous contrast. Multiplanar CT image reconstructions and MIPs were obtained to evaluate the vascular anatomy. CONTRAST:  117mL OMNIPAQUE IOHEXOL 350 MG/ML SOLN COMPARISON:  CT 01/26/2017, radiograph 07/02/2019 FINDINGS: Cardiovascular: Sagittal opacification of pulmonary arteries. No central, lobar or proximal segmental filling defects. More distal evaluation may be limited by extensive respiratory motion artifact which is pronounced towards the lung bases. Central pulmonary arteries are within normal limits for caliber. Punctate gas in the main pulmonary artery likely related to intravenous access. Overall, the cardiac size is within normal limits albeit with some mild right heart enlargement and reflux  of contrast into the IVC and hepatic veins. Scant coronary calcium is noted. No pericardial effusion. Atherosclerotic plaque within the normal caliber aorta. Normal 3 vessel branching of the aortic arch. Minimal plaque in the proximal great vessels. No acute luminal abnormality of the thoracic aorta or proximal great vessels. No periaortic stranding or hemorrhage. Mediastinum/Nodes: Scattered low-attenuation mediastinal and hilar nodes many of which are subcentimeter, favored to be reactive. The largest node is a 13 mm precarinal lymph node however this is stable from comparison study. Few stable calcified thyroid nodules. No further imaging is warranted based on patient age. This follows consensus guidelines: Managing Incidental Thyroid Nodules Detected on Imaging: White Paper of the ACR Incidental Thyroid Findings Committee. J Am Coll Radiol 2015; 12:143-150. and [facility] 3-tiered system for managing ITNs: J Am Coll Radiol. 2015; Feb;12(2): 143-50 Lungs/Pleura: The airways are diffusely thickened and many towards the bases appear fluid-filled with some chronic architectural distortion and scarring in  the left lower lobe, lingula and right middle lobe. Increasing consolidative opacities present in the right lung base. Additional patchy consolidation with tree-in-bud nodularity is present in the right upper lobe as well. Some mosaic attenuation may reflect further small airways disease or air trapping. Evaluation for concerning pulmonary nodules and masses limited due to extensive respiratory motion. No pneumothorax. No visible effusion. Upper Abdomen: Large hiatal hernia as above. No acute abnormalities present in the visualized portions of the upper abdomen. Musculoskeletal: Exaggerated thoracic kyphosis with increased AP diameter of the chest. Mild dextrocurvature of the thoracic spine noted as well. Multilevel degenerative changes are present in the imaged portions of the spine. No acute osseous abnormality or suspicious osseous lesion. Review of the MIP images confirms the above findings. IMPRESSION: 1. No evidence of acute central, lobar or proximal segmental pulmonary embolism. More distal evaluation may be limited by extensive respiratory motion artifact. 2. Diffusely thickened airways with many of the lung bases appearing fluid-filled likely reflecting features of aspiration, likely chronic given chronic architectural distortion and scarring throughout both lungs. More acute areas of airspace disease in the right lower lobe and upper lobe. 3. Mosaic attenuation may reflect further small airways disease or air trapping. 4. Right heart enlargement and reflux of contrast into the hepatic veins and IVC may reflect elevated right heart pressure/right heart failure. 5. Large hiatal hernia. 6. Aortic Atherosclerosis (ICD10-I70.0). Electronically Signed   By: Kreg Shropshire M.D.   On: 07/02/2019 22:23   DG CHEST PORT 1 VIEW  Result Date: 07/05/2019 CLINICAL DATA:  86 year old female with bronchiectasis. EXAM: PORTABLE CHEST 1 VIEW COMPARISON:  Chest CT dated 07/02/2019 and chest radiograph dated 07/02/2019.  FINDINGS: Right upper lobe nodular density as well as smaller densities in the left upper lobe. There is a patchy area of airspace opacity in the right infrahilar region likely corresponding to the density seen on the prior CT in the right lower lobe. Left lung base opacity appears similar to prior radiograph and corresponds to the consolidative changes of the lingula and left lower lobe. Probable small bilateral pleural effusions. No pneumothorax. Stable cardiomediastinal silhouette. Moderate size hiatal hernia. No acute osseous pathology. IMPRESSION: 1. Bilateral upper lobe and lower lobe consolidative changes as seen on the prior CT. 2. Probable small bilateral pleural effusions.  No pneumothorax. Electronically Signed   By: Elgie Collard M.D.   On: 07/05/2019 16:17   DG Chest Port 1 View  Result Date: 07/02/2019 CLINICAL DATA:  86 year old with cough and shortness of breath. EXAM: PORTABLE CHEST 1  VIEW COMPARISON:  Radiograph 02/09/2019.  Chest CT 01/26/2017 FINDINGS: Unchanged heart size and mediastinal contours. Retrocardiac hiatal hernia. Blunting of the left costophrenic angle appears chronic and related to scarring. Stable scarring in the perifissural right upper lobe. No evidence of acute or focal airspace disease. No pulmonary edema, pneumothorax, or large pleural effusion. Scoliotic curvature of the spine with stable osseous structures. IMPRESSION: 1. No acute abnormality.  Bilateral lung scarring. 2. Hiatal hernia. Electronically Signed   By: Narda Rutherford M.D.   On: 07/02/2019 19:00    DISCHARGE EXAMINATION: Vitals:   07/08/19 2026 07/08/19 2046 07/09/19 0549 07/09/19 1116  BP:  (!) 141/68 (!) 170/98   Pulse:  94 94   Resp:  14 14   Temp:  98.1 F (36.7 C) 97.7 F (36.5 C)   TempSrc:  Oral Oral   SpO2: 92% 90% 90% 92%  Weight:      Height:       General appearance: Awake alert.  In no distress Resp: Normal effort at rest.  Crackles heard bilaterally which appears to be her  baseline. Cardio: S1-S2 is normal regular.  No S3-S4.  No rubs murmurs or bruit GI: Abdomen is soft.  Nontender nondistended.  Bowel sounds are present normal.  No masses organomegaly   DISPOSITION: Home with daughter  Discharge Instructions    Call MD for:  difficulty breathing, headache or visual disturbances   Complete by: As directed    Call MD for:  extreme fatigue   Complete by: As directed    Call MD for:  persistant dizziness or light-headedness   Complete by: As directed    Call MD for:  persistant nausea and vomiting   Complete by: As directed    Call MD for:  severe uncontrolled pain   Complete by: As directed    Call MD for:  temperature >100.4   Complete by: As directed    Discharge instructions   Complete by: As directed    Please take your medications as prescribed.  Follow-up with your pulmonologist as scheduled.  You were cared for by a hospitalist during your hospital stay. If you have any questions about your discharge medications or the care you received while you were in the hospital after you are discharged, you can call the unit and asked to speak with the hospitalist on call if the hospitalist that took care of you is not available. Once you are discharged, your primary care physician will handle any further medical issues. Please note that NO REFILLS for any discharge medications will be authorized once you are discharged, as it is imperative that you return to your primary care physician (or establish a relationship with a primary care physician if you do not have one) for your aftercare needs so that they can reassess your need for medications and monitor your lab values. If you do not have a primary care physician, you can call [phone] for a physician referral.   Increase activity slowly   Complete by: As directed         Allergies as of 07/09/2019      Reactions   Codeine Anaphylaxis   Hydrocodone Nausea And Vomiting, Other (See Comments)   SYNCOPE AND  BRADYCARDIA   Isosorbide Nitrate Other (See Comments)   BRADYCARDIA   Oxycodone Palpitations   Rapid heart beat   Clarithromycin Nausea And Vomiting   Has patient had a PCN reaction causing immediate rash, facial/tongue/throat swelling, SOB or lightheadedness with hypotension: Unknown Has  patient had a PCN reaction causing severe rash involving mucus membranes or skin necrosis: Unknown Has patient had a PCN reaction that required hospitalization: Unknown Has patient had a PCN reaction occurring within the last 10 years: Unknown If all of the above answers are "NO", then may proceed with Cephalosporin use.   Fiorinal [butalbital-aspirin-caffeine] Swelling, Other (See Comments)   Facial swelling   Levofloxacin Nausea And Vomiting, Other (See Comments)   SYNCOPE ALSO   Augmentin [amoxicillin-pot Clavulanate] Diarrhea   Doxycycline    Sweats and aches      Medication List    STOP taking these medications   azithromycin 250 MG tablet Commonly known as: ZITHROMAX   cefdinir 300 MG capsule Commonly known as: OMNICEF   gabapentin 100 MG capsule Commonly known as: NEURONTIN     TAKE these medications   acetaminophen 500 MG tablet Commonly known as: TYLENOL Take 500 mg by mouth every 6 (six) hours as needed for moderate pain.   albuterol (2.5 MG/3ML) 0.083% nebulizer solution Commonly known as: PROVENTIL USE 1 VIAL VIA NEBULIZER TWICE DAILY What changed: See the new instructions.   Alphagan P 0.1 % Soln Generic drug: brimonidine Place 1 drop into the left eye every 8 (eight) hours.   ALPRAZolam 0.5 MG tablet Commonly known as: XANAX Take 1 tablet by mouth as directed. Take 1 tablet (0.5 mg) BID at (8 am & 8 pm) & DAILY PRN FOR ANXIETY   amLODipine 10 MG tablet Commonly known as: NORVASC Take 0.5 tablets (5 mg total) by mouth daily as needed. May take an additional 0.5 mg tablet as needed for persistent elevated BP   amoxicillin-clavulanate 875-125 MG tablet Commonly  known as: AUGMENTIN Take 1 tablet by mouth every 12 (twelve) hours for 7 days.   calcium carbonate 500 MG chewable tablet Commonly known as: TUMS - dosed in mg elemental calcium Chew 1 tablet by mouth every 8 (eight) hours as needed for indigestion or heartburn.   carvedilol 6.25 MG tablet Commonly known as: COREG TAKE 1 TABLET(6.25 MG) BY MOUTH TWICE DAILY   clopidogrel 75 MG tablet Commonly known as: PLAVIX TAKE 1 TABLET(75 MG) BY MOUTH DAILY What changed: See the new instructions.   DULoxetine 20 MG capsule Commonly known as: CYMBALTA Take 20 mg by mouth 3 (three) times daily.   escitalopram 5 MG tablet Commonly known as: LEXAPRO Take 5 mg by mouth daily.   FUSION PLUS PO Take 1 tablet by mouth daily.   levothyroxine 50 MCG tablet Commonly known as: SYNTHROID Take 50 mcg by mouth daily.   loratadine 10 MG tablet Commonly known as: CLARITIN Take 10 mg by mouth daily.   NONFORMULARY OR COMPOUNDED ITEM [facility]:  Neuropathy Cream #11 - Bupivacaine 1%, Doxepin 3%, Gabapentin 6%, Pentoxifylline 3%, Topiramate 1%. Apply 1-2 grams to affected areas 3-4 times a day.   pantoprazole 40 MG tablet Commonly known as: PROTONIX Take 40 mg by mouth 2 (two) times daily.   predniSONE 10 MG tablet Commonly known as: DELTASONE TAKE 1 TABLET(10 MG) BY MOUTH DAILY WITH BREAKFAST   Rocklatan 0.02-0.005 % Soln Generic drug: Netarsudil-Latanoprost Apply 1 drop to eye at bedtime.   saccharomyces boulardii 250 MG capsule Commonly known as: FLORASTOR Take 1 capsule (250 mg total) by mouth 2 (two) times daily for 15 days.   SENOKOT PO Take 1 tablet by mouth daily as needed (constipation).   Simbrinza 1-0.2 % Susp Generic drug: Brinzolamide-Brimonidine Place 1 drop into the left eye in the  morning, at noon, and at bedtime.         Follow-up Information    Coral Ceo, NP Follow up on 07/23/2019.   Specialty: Pulmonary Disease Why: Appt at 10:30 for hospital follow  up, review of cultures Contact information: [address] [place] [address] [phone]        Leslye Peer, MD Follow up on 08/11/2019.   Specialty: Pulmonary Disease Why: Appt at 10:45 Contact information: [address] [place] [address] [phone]               TOTAL DISCHARGE TIME: 35 minutes  Sarah [name] Sarah [name]  [facility] Pager on [email or web address]  07/09/2019, 1:01 PM

## 2019-07-09 NOTE — Discharge Instructions (Signed)
Bronchiectasis  Bronchiectasis is a condition in which the airways in the lungs (bronchi) are damaged and widened. The condition makes it hard for the lungs to get rid of mucus, and it causes mucus to gather in the bronchi. This condition often leads to lung infections, which can make the condition worse. What are the causes? You can be born with this condition or you can develop it later in life. Common causes of this condition include:  Cystic fibrosis.  Repeated lung infections, such as pneumonia or tuberculosis.  An object or other blockage in the lungs.  Breathing in fluid, food, or other objects (aspiration).  A problem with the immune system and lung structure that is present at birth (congenital). Sometimes the cause is not known. What are the signs or symptoms? Common symptoms of this condition include:  A daily cough that brings up mucus and lasts for more than 3 weeks.  Lung infections that happen often.  Shortness of breath and wheezing.  Weakness and fatigue. How is this diagnosed? This condition is diagnosed with tests, such as:  Chest X-rays or CT scans. These are done to check for changes in the lungs.  Breathing tests. These are done to check how well your lungs are working.  A test of a sample of your saliva (sputum culture). This test is done to check for infection.  Blood tests and other tests. These are done to check for related diseases or causes. How is this treated? Treatment for this condition depends on the severity of the illness and its cause. Treatment may include:  Medicines that loosen mucus so it can be coughed up (expectorants).  Medicines that relax the muscles of the bronchi (bronchodilators).  Antibiotic medicines to prevent or treat infection.  Physical therapy to help clear mucus from the lungs. Techniques may include: ? Postural drainage. This is when you sit or lie in certain positions so that mucus can drain by gravity. ? Chest  percussion. This involves tapping the chest or back with a cupped hand. ? Chest vibration. For this therapy, a hand or special equipment vibrates your chest and back.  Surgery to remove the affected part of the lung. This may be done in severe cases. Follow these instructions at home: Medicines  Take over-the-counter and prescription medicines only as told by your health care provider.  If you were prescribed an antibiotic medicine, take it as told by your health care provider. Do not stop taking the antibiotic even if you start to feel better.  Avoid taking sedatives and antihistamines unless your health care provider tells you to take them. These medicines tend to thicken the mucus in the lungs. Managing symptoms  Perform breathing exercises or techniques to clear your lungs as told by your health care provider.  Consider using a cold steam vaporizer or humidifier in your room or home to help loosen secretions.  If you have a cough that gets worse at night, try sleeping in a semi-upright position. General instructions  Get plenty of rest.  Drink enough fluid to keep your urine clear or pale yellow.  Stay inside when pollution and ozone levels are high.  Stay up to date with vaccinations and immunizations.  Avoid cigarette smoke and other lung irritants.  Do not use any products that contain nicotine or tobacco, such as cigarettes and e-cigarettes. If you need help quitting, ask your health care provider.  Keep all follow-up visits as told by your health care provider. This is important. Contact   a health care provider if:  You cough up more sputum than before and the sputum is yellow or green in color.  You have a fever.  You cannot control your cough and are losing sleep. Get help right away if:  You cough up blood.  You have chest pain.  You have increasing shortness of breath.  You have pain that gets worse or is not controlled with medicines.  You have a fever  and your symptoms suddenly get worse. Summary  Bronchiectasis is a condition in which the airways in the lungs (bronchi) are damaged and widened. The condition makes it hard for the lungs to get rid of mucus, and it causes mucus to gather in the bronchi.  Treatment usually includes therapy to help clear mucus from the lungs.  Stay up to date with vaccinations and immunizations. This information is not intended to replace advice given to you by your health care provider. Make sure you discuss any questions you have with your health care provider. Document Revised: 12/13/2016 Document Reviewed: 02/05/2016 Elsevier Patient Education  2020 Elsevier Inc.  

## 2019-07-09 NOTE — TOC Initial Note (Signed)
Transition of Care [facility]) - Initial/Assessment Note    Patient Details  Name: Sarah [name] MRN: [id number] Date of Birth: 1921-08-22  [facility]) CM/SW Contact:    Marlita Keil, Meriam Sprague, RN Phone Number: 07/09/2019, 12:43 PM  Clinical Narrative:                 Pt from [facility] independent living and to go back there at DC. [facility] has onsite PT International aid/development worker) that can come to pt apartment. Pt states she would like [facility] to provide PT for her when she gets home. This CM faxed HHPT orders to [facility] and gave referral to [facility].  Expected Discharge Plan: Home/Self Care Barriers to Discharge: No Barriers Identified  Expected Discharge Plan and Services Expected Discharge Plan: Home/Self Care       Living arrangements for the past 2 months: Apartment Expected Discharge Date: 07/09/19                   Prior Living Arrangements/Services Living arrangements for the past 2 months: Apartment Lives with:: Self Patient language and need for interpreter reviewed:: Yes          Activities of Daily Living Home Assistive Devices/Equipment: Blood pressure cuff, Cane (specify quad or straight), Dentures (specify type) ADL Screening (condition at time of admission) Patient's cognitive ability adequate to safely complete daily activities?: Yes Is the patient deaf or have difficulty hearing?: Yes Does the patient have difficulty seeing, even when wearing glasses/contacts?: No Does the patient have difficulty concentrating, remembering, or making decisions?: No Patient able to express need for assistance with ADLs?: Yes Does the patient have difficulty dressing or bathing?: No (takes extra time) Independently performs ADLs?: No Communication: Independent Dressing (OT): Needs assistance Is this a change from baseline?: Change from baseline, expected to last <3days Grooming: Independent Feeding: Independent Bathing: Needs assistance Is this a change from baseline?:  Change from baseline, expected to last <3 days Toileting: Needs assistance Is this a change from baseline?: Change from baseline, expected to last <3 days In/Out Bed: Needs assistance Is this a change from baseline?: Change from baseline, expected to last <3 days Walks in Home: Needs assistance Is this a change from baseline?: Change from baseline, expected to last <3 days Does the patient have difficulty walking or climbing stairs?: No Weakness of Legs: Both Weakness of Arms/Hands: Both  Permission Sought/Granted   Permission granted to share information with : Yes, Verbal Permission Granted     Permission granted to share info w AGENCY: [facility]        Emotional Assessment Appearance:: Appears stated age     Orientation: : Oriented to Self, Oriented to Place, Oriented to  Time, Oriented to Situation      Admission diagnosis:  SOB (shortness of breath) [R06.02] Hypoxia [R09.02] Acute respiratory failure with hypoxia (HCC) [J96.01] Patient Active Problem List   Diagnosis Date Noted  . Acute respiratory failure with hypoxia (HCC) 07/02/2019  . History of CVA (cerebrovascular accident) 07/02/2019  . CAD (coronary artery disease) 07/02/2019  . Glaucoma 07/02/2019  . S/P thoracentesis   . Pleural effusion   . Pneumonia of left lung due to infectious organism 12/16/2018  . Community acquired pneumonia 12/16/2018  . HCAP (healthcare-associated pneumonia) 12/16/2018  . Leukocytosis   . Hemoptysis 03/16/2018  . GERD (gastroesophageal reflux disease) 03/19/2017  . Nausea vomiting and diarrhea   . Gastroenteritis due to norovirus 01/25/2017  . Adrenal insufficiency (HCC) 03/21/2015  . HTN (hypertension) 02/14/2015  .  Fecal impaction ([place]) 10/07/2014  . Colitis 10/06/2014  . Cerebral infarction due to thrombosis of basilar artery ([place]) 09/09/2014  . Aneurysm, cerebral, nonruptured 09/09/2014  . HLD (hyperlipidemia) 09/09/2014  . Acute ischemic stroke ([place]) 07/09/2014  .  Chronic diastolic CHF (congestive heart failure) ([place]) 07/09/2014  . Stroke ([place]) 07/08/2014  . TIA (transient ischemic attack) 07/08/2014  . Chest pain 04/15/2012  . Shortness of breath dyspnea 11/23/2011  . Anxiety 11/23/2011  . Malignant hypertension 11/23/2011  . Elevated troponin 06/20/2011  . Depression   . Hypothyroidism   . Arthritis   . Racing heart beat 02/11/2011  . NSTEMI (non-ST elevated myocardial infarction) ([place]) 09/24/2010  . Idiopathic peripheral neuropathy   . Pollen allergies   . BRONCHIECTASIS 07/29/2008  . Essential hypertension 06/15/2008  . Allergic rhinitis 06/15/2008  . Asthma 06/15/2008  . Cough 06/15/2008   PCP:  [name], MD Pharmacy:   [facility] [place], [place] [address] AT [place] [address] Phone: [phone] Fax: [phone]  [id number] [address], [place] [address] Phone: [phone] Fax: [phone]     Social Determinants of Health ([place]) Interventions    Readmission Risk Interventions Readmission Risk Prevention Plan 12/20/2018  Transportation Screening Complete  Some recent data might be hidden

## 2019-07-13 ENCOUNTER — Other Ambulatory Visit: Payer: Self-pay

## 2019-07-13 ENCOUNTER — Non-Acute Institutional Stay: Payer: Medicare Other

## 2019-07-13 DIAGNOSIS — Z515 Encounter for palliative care: Secondary | ICD-10-CM

## 2019-07-14 NOTE — Progress Notes (Signed)
COMMUNITY PALLIATIVE CARE SW NOTE  PATIENT NAME: Sarah [name] DOB: September 27, 1921 MRN: [id number]  PRIMARY CARE PROVIDER: [name], MD  RESPONSIBLE PARTY:  Acct ID - Guarantor Home Phone Work Phone Relationship Acct Type  [id number] - [name],[name] [phone]  Self P/F     [address], [place], [place] [address]     PLAN OF CARE and INTERVENTIONS:             1. GOALS OF CARE/ ADVANCE CARE PLANNING:  Goal is for patient to remain in her apartment ([facility] [place]) and as independent as possible. Patient is a DNR. 2. SOCIAL/EMOTIONAL/SPIRITUAL ASSESSMENT/ INTERVENTIONS:  SW completed face-to-face visit with patient at the facility ([facility]).  Patient was laying on her couch napping. She easily aroused. Patient reported that she was hospitalized from 6/18-6/25/21 at [facility] for pnemonia. She denied pain, but reported increased fatigue. Patient report that her appetite remains fair, but is not back fully back since recent hospitalization.  Patient report that she has not been up walking out in the halls as much as she use to due to concerns about her balance, which she is still unsteady. She is having increased fatigued. She is thin and weak in appearance. Patient continues to go down to the dinning room for meals and socialize. Her family remains supportive.Although patient stated " I don't want to be a burden to my girls". SW processed these feelings. Patient will start physical therapy next.  SW provided supportive presence, active and reflective listing, assessed needs, coping, comfort and safety, observation and life review. 3. PATIENT/CAREGIVER EDUCATION/ COPING:  Patient expressed no coping issues or distress. She has a strong supportive network with friends and family.    4. PERSONAL EMERGENCY PLAN:  Patient has an emergency pendant. Patient's primary care physician can be contacted by PCG for any concerns. SW reinforced access to palliative care support when needed. 911 can be  accessed for emergencies.  5. COMMUNITY RESOURCES COORDINATION/ HEALTH CARE NAVIGATION:  Patient has assistance through [facility] nursing staff to assist with personal care needs and medication reminder. 6. FINANCIAL/LEGAL CONCERNS/INTERVENTIONS:  No financial or legal issues noted.      SOCIAL HX:  Social History   Tobacco Use  . Smoking status: Never Smoker  . Smokeless tobacco: Never Used  Substance Use Topics  . Alcohol use: Yes    Alcohol/week: 2.0 standard drinks    Types: 2 Glasses of wine per week    Comment: wine 1-2 week    CODE STATUS: DNR ADVANCED DIRECTIVES: Completed MOST FORM COMPLETE:  No HOSPICE EDUCATION PROVIDED: No  PPS: Patient is alert and oriented x3, but admits that she is forgetful. She ambulates independently in her apartment, but requires standby assistance with ADL's.    Duration of visit and documentation: 45 minutes.  [address] [place], [place]

## 2019-07-16 DIAGNOSIS — M6281 Muscle weakness (generalized): Secondary | ICD-10-CM | POA: Diagnosis not present

## 2019-07-16 DIAGNOSIS — J441 Chronic obstructive pulmonary disease with (acute) exacerbation: Secondary | ICD-10-CM | POA: Diagnosis not present

## 2019-07-16 DIAGNOSIS — R262 Difficulty in walking, not elsewhere classified: Secondary | ICD-10-CM | POA: Diagnosis not present

## 2019-07-16 DIAGNOSIS — R2681 Unsteadiness on feet: Secondary | ICD-10-CM | POA: Diagnosis not present

## 2019-07-19 DIAGNOSIS — J441 Chronic obstructive pulmonary disease with (acute) exacerbation: Secondary | ICD-10-CM | POA: Diagnosis not present

## 2019-07-19 DIAGNOSIS — M6281 Muscle weakness (generalized): Secondary | ICD-10-CM | POA: Diagnosis not present

## 2019-07-19 DIAGNOSIS — R262 Difficulty in walking, not elsewhere classified: Secondary | ICD-10-CM | POA: Diagnosis not present

## 2019-07-19 DIAGNOSIS — R2681 Unsteadiness on feet: Secondary | ICD-10-CM | POA: Diagnosis not present

## 2019-07-23 ENCOUNTER — Other Ambulatory Visit: Payer: Self-pay

## 2019-07-23 ENCOUNTER — Encounter: Payer: Self-pay | Admitting: Pulmonary Disease

## 2019-07-23 ENCOUNTER — Ambulatory Visit (INDEPENDENT_AMBULATORY_CARE_PROVIDER_SITE_OTHER): Payer: Medicare Other | Admitting: Pulmonary Disease

## 2019-07-23 VITALS — BP 142/80 | HR 72 | Temp 97.8°F | Ht 63.78 in | Wt 119.0 lb

## 2019-07-23 DIAGNOSIS — E2740 Unspecified adrenocortical insufficiency: Secondary | ICD-10-CM

## 2019-07-23 DIAGNOSIS — J690 Pneumonitis due to inhalation of food and vomit: Secondary | ICD-10-CM | POA: Diagnosis not present

## 2019-07-23 DIAGNOSIS — J479 Bronchiectasis, uncomplicated: Secondary | ICD-10-CM | POA: Diagnosis not present

## 2019-07-23 DIAGNOSIS — Z2239 Carrier of other specified bacterial diseases: Secondary | ICD-10-CM | POA: Diagnosis not present

## 2019-07-23 DIAGNOSIS — J189 Pneumonia, unspecified organism: Secondary | ICD-10-CM | POA: Diagnosis not present

## 2019-07-23 DIAGNOSIS — J4521 Mild intermittent asthma with (acute) exacerbation: Secondary | ICD-10-CM

## 2019-07-23 NOTE — Assessment & Plan Note (Signed)
Recent hospitalization for bronchiectatic exacerbation Maintained on maintenance rounds of antibiotic Known bronchiectasis Appears clinically improved We will obtain chest x-ray imaging next week to monitor for improved aeration of the lung

## 2019-07-23 NOTE — Assessment & Plan Note (Signed)
Reviewed swallow study recommendations with patient as well as daughter  Plan: Continue to follow swallow study recommendations from hospitalization to reduce risk of aspiration pneumonia

## 2019-07-23 NOTE — Assessment & Plan Note (Signed)
History of adrenal insufficiency  Plan: Continue 10 mg of prednisone daily

## 2019-07-23 NOTE — Patient Instructions (Addendum)
You were seen today by Coral Ceo, NP  for:   1. BRONCHIECTASIS  Continue therapy vest 2 times a day  If for some reason you are unable to use your therapy vest then okay to use your flutter valve this should be a rare occurrence  Bronchiectasis: This is the medical term which indicates that you have damage, dilated airways making you more susceptible to respiratory infection. Use a flutter valve 10 breaths twice a day or 4 to 5 breaths 4-5 times a day to help clear mucus out Let us know if you have cough with change in mucus color or fevers or chills.  At that point you would need an antibiotic. Maintain a healthy nutritious diet, eating whole foods Take your medications as prescribed   Continue rotating antibiotics as instructed by Dr. Delton Coombes  2. Pneumonia of left lower lobe due to infectious organism  We will repeat a chest x-ray next week  3. Pseudomonas aeruginosa colonization  In 2018 as well as in 2020 you tested positive for Pseudomonas in your sputum  We currently have sputum cultures growing from your hospitalization in June  We will continue to monitor these  Please contact our office if you are having worsening cough, congestion or increased mucus production  4. Aspiration pneumonia of both lungs, unspecified aspiration pneumonia type, unspecified part of lung (HCC)  Please follow the recommendations from the swallow study in the hospital see below:   Diet recommendations: Regular;Thin liquid Liquids provided via: Cup;Straw Medication Administration: Whole meds with puree Supervision: Patient able to self feed Compensations: Slow rate;Small sips/bites Postural Changes and/or Swallow Maneuvers: Seated upright 90 degrees;Upright 30-60 min after meal   5. Mild intermittent asthma with acute exacerbation  Okay to continue albuterol nebulized meds for management of your asthma   Follow Up:    Return in about 19 days (around 08/11/2019), or if symptoms worsen  or fail to improve, for Follow up with Dr. Delton Coombes.   Please do your part to reduce the spread of COVID-19:      Reduce your risk of any infection  and COVID19 by using the similar precautions used for avoiding the common cold or flu:  Marland Kitchen Wash your hands often with soap and warm water for at least 20 seconds.  If soap and water are not readily available, use an alcohol-based hand sanitizer with at least 60% alcohol.  . If coughing or sneezing, cover your mouth and nose by coughing or sneezing into the elbow areas of your shirt or coat, into a tissue or into your sleeve (not your hands). Drinda Butts A MASK when in public  . Avoid shaking hands with others and consider head nods or verbal greetings only. . Avoid touching your eyes, nose, or mouth with unwashed hands.  . Avoid close contact with people who are sick. . Avoid places or events with large numbers of people in one location, like concerts or sporting events. . If you have some symptoms but not all symptoms, continue to monitor at home and seek medical attention if your symptoms worsen. . If you are having a medical emergency, call 911.   ADDITIONAL HEALTHCARE OPTIONS FOR PATIENTS  [facility] / e-Visit: [email or web address]         [facility]: [phone]  [facility]: [phone]                   [facility]: [phone]     It  is flu season:   >>> Best ways to protect herself from the flu: Receive the yearly flu vaccine, practice good hand hygiene washing with soap and also using hand sanitizer when available, eat a nutritious meals, get adequate rest, hydrate appropriately   Please contact the office if your symptoms worsen or you have concerns that you are not improving.   Thank you for choosing [facility] for your healthcare, and for allowing [place] to partner with you on your healthcare journey. I am thankful to be able  to provide care to you today.   Elisha Headland FNP-C

## 2019-07-23 NOTE — Progress Notes (Signed)
_0  ID: Sarah [name], female    DOB: 1921-06-22, 86 y.o.   MRN: [id number]  Chief Complaint  Patient presents with  . Hospitalization Follow-up    hosp 6/18, DOE, productive cough, clear with some yellow sputum    Referring provider: [name], MD  HPI:  86 year old female never smoker followed in our office for bronchiectasis  PMH: Diastolic heart failure, hypertension, non-STEMI, arthritis, anxiety, GERD Smoker/ Smoking History: Never smoker Maintenance: Albuterol nebs, flutter valve, therapy vest, rotating antibiotics monthly Pt of: Dr. Lamonte [name]  07/23/2019  - Visit   86 year old female never smoker followed in our office for asthma, bronchiectasis, cough.  Patient is followed in our office by Dr. Lamonte [name].  Patient was last seen in our office by Dr. Lamonte [name] in May/2021.  At that time it was recommended that she continue on prednisone 10 mg once daily, she was started on rotating antibiotics with azithromycin and Omnicef she was recommended continue to use her smart vest 1-2 times per day, continue Protonix twice a day, continue scheduled albuterol nebs, and to follow-up in 4 months.  Unfortunately the patient was hospitalized on 07/02/2019 and discharged on 07/09/2019.  Excerpt of that discharge summary is listed below:  Admit date: 07/02/2019 Discharge date: 07/09/2019  PCP: Sarah Jordan, MD  DISCHARGE DIAGNOSES:  Aspiration pneumonia History of bronchiectasis Acute respiratory failure with hypoxia, resolved Essential hypertension Coronary artery disease History of stroke History of anxiety Hypothyroidism Glaucoma  RECOMMENDATIONS FOR OUTPATIENT FOLLOW UP: 1. Follow-up with pulmonology.  Appointments have been made.  Home Health: Home health PT Equipment/Devices: Patient was ambulated and was noted to be saturating above 90% on RA.  CODE STATUS: DNR  DISCHARGE CONDITION: fair  Diet recommendation: As before and as instructed by speech  therapy  INITIAL HISTORY: Sarah [name] a 87 y.o.femalewith medical history significant forHx of bronchiectasis on chronic steroids and rotates azithromycin/Omnicefmonthly (follows with Dr. Lamonte [name]), CAD s/p NSTEMI, CVA, hypothyroidism, depression, HLD who presented to the ED on 07/02/19 with acute and progressive shortness of breath since the day before. Associated productive coughworse than baseline, low grade fever and hypoxia. In the ED, afebrile, mildly tachypneic, requiring 2L and hypertensive up to systolic of 124P. Lab work notable for leukocytosis of 13.8. Lactic acid of 1.1. Normal creatinine of 0.79. Chest x-ray had no acute abnormalities.CTA chest was negative for PE but showed diffusely thickened airways with lower airways fluid-filled concerning for aspiration, and more acute areas of airspace opacities in the right upper and lower lobes. Admitted for further evaluation and management of aspiration pneumonia vs bronchiectasis flare.   Of note, patient was hospitalized in December 2020 for Pseudomonas pneumonia and had to have left pleural effusion requiring thoracentesis and removal of 450 mL of transudativepleural fluid.  Consultations:  Pulmonology  HOSPITAL COURSE:   Acute hypoxic respiratory failure secondary to aspiration pneumoniavs bronchiectasis flare Patient is on chronic antibiotics for bronchiectasis as outpatient. Azithromycin alternating with Omnicef. ReceivedIV Rocephin and azithromycin in ED.Procalcitonin <0.10. Patient does have history of Pseudomonas pneumonia in Dec 2020. Patient was started on Unasyn on admission. Patient's respiratory symptoms have been very slow to improve.WBC is noted to be normal. Pulmonology has been following as well. PT and OT evaluation. Patient also seen by speech therapy. Patient and daughter declined modified barium swallow. Sputum was growing gram-negative bacteria but it looks like final report  suggest normal respiratory flora. She was changed over from Unasyn to Augmentin after discussions with pulmonology. Apparently she has an allergic reaction which is  listed as diarrhea. Discussed with patient and her daughter. No true allergic reaction to penicillin or Augmentin previously. She has been tolerating Unasyn without any problems. She tolerated Augmentin without any difficulties.  She will be discharged on the same along with probiotics.   Also on prednisone at home which is being continued. She was initially noted to desaturate with ambulation.  Was thought to require home oxygen.  She was ambulated again today however did not go below 90% on room air.  Does not meet criteria for home oxygen.  Essential Hypertension Blood pressure noted to be high at times.  She may resume her home medication regimen.  Hx of CAD Stable. She is asymptomatic  History of CVA Stable. Continue Plavix  History anxiety Continue Lexapro and Xanax  Hypothyroidism Continue Synthroid  Glaucoma Continue eyedrops brought from home  Patient was also evaluated by our team.  Patient with history of bronchiectasis as well as Pseudomonas colonization that has intermediate response to gentamicin otherwise sensitive.  It was recommended that she continue to have repeat chest x-rays, 1 dose of Lasix 40 mg, obtain sputum cultures as well as a flutter valve.  Is also recommended that she be evaluated for swallow study.  She is currently a DNR.  The results from the hospital regarding the swallow study are currently still growing out in lab.  Current results show no abnormal bacteria at this time.  SLP Recommendations  Diet recommendations: Regular;Thin liquid Liquids provided via: Cup;Straw Medication Administration: Whole meds with puree Supervision: Patient able to self feed Compensations: Slow rate;Small sips/bites Postural Changes and/or Swallow Maneuvers: Seated upright 90 degrees;Upright  30-60 min after meal    Patient reporting that she is doing well since being discharged from the hospital.  She is planning on starting her Omnicef as scheduled in about 2 weeks.  She has plan to follow-up with Dr. Lamonte [name] later on this month.  She is here with her daughter.  They have questions regarding her bronchiectasis management.  Therapy vest and flutter valve use.  We will talk about this today.   Questionaires / Pulmonary Flowsheets:   ACT:  No flowsheet data found.  MMRC: No flowsheet data found.  Epworth:  No flowsheet data found.  Tests:   12/10/2016-respiratory sputum-Pseudomonas-intermediate to gentamicin, sensitive to Levaquin (0.12) and ciprofloxacin (0.25)  12/18/2018-respiratory sputum culture-few Pseudomonas-intermediate to gentamicin, sensitive otherwise  07/02/2019-CTA chest-no evidence of PE, diffuse Lee thickened airways and many of the lung bases appearing fluid-filled likely reflecting features of aspiration likely chronic given chronic architectural distortion and scarring throughout both lungs, mosaic attenuation may reflect further small airways disease or air trapping  07/05/2019-chest x-ray-bilateral upper lobe and lower lobe consolidative changes as seen on prior CT, probable small bilateral pleural effusions   FENO:  No results found for: NITRICOXIDE  PFT: No flowsheet data found.  WALK:  No flowsheet data found.  Imaging: CT Angio Chest PE W/Cm &/Or Wo Cm  Result Date: 07/02/2019 CLINICAL DATA:  Shortness of breath and productive cough fever since yesterday EXAM: CT ANGIOGRAPHY CHEST WITH CONTRAST TECHNIQUE: Multidetector CT imaging of the chest was performed using the standard protocol during bolus administration of intravenous contrast. Multiplanar CT image reconstructions and MIPs were obtained to evaluate the vascular anatomy. CONTRAST:  127m OMNIPAQUE IOHEXOL 350 MG/ML SOLN COMPARISON:  CT 01/26/2017, radiograph 07/02/2019 FINDINGS:  Cardiovascular: Sagittal opacification of pulmonary arteries. No central, lobar or proximal segmental filling defects. More distal evaluation may be limited by extensive respiratory motion artifact which  is pronounced towards the lung bases. Central pulmonary arteries are within normal limits for caliber. Punctate gas in the main pulmonary artery likely related to intravenous access. Overall, the cardiac size is within normal limits albeit with some mild right heart enlargement and reflux of contrast into the IVC and hepatic veins. Scant coronary calcium is noted. No pericardial effusion. Atherosclerotic plaque within the normal caliber aorta. Normal 3 vessel branching of the aortic arch. Minimal plaque in the proximal great vessels. No acute luminal abnormality of the thoracic aorta or proximal great vessels. No periaortic stranding or hemorrhage. Mediastinum/Nodes: Scattered low-attenuation mediastinal and hilar nodes many of which are subcentimeter, favored to be reactive. The largest node is a 13 mm precarinal lymph node however this is stable from comparison study. Few stable calcified thyroid nodules. No further imaging is warranted based on patient age. This follows consensus guidelines: Managing Incidental Thyroid Nodules Detected on Imaging: White Paper of the ACR Incidental Thyroid Findings Committee. J Am Coll Radiol 2015; 12:143-150. and [facility] 3-tiered system for managing ITNs: J Am Coll Radiol. 2015; Feb;12(2): 143-50 Lungs/Pleura: The airways are diffusely thickened and many towards the bases appear fluid-filled with some chronic architectural distortion and scarring in the left lower lobe, lingula and right middle lobe. Increasing consolidative opacities present in the right lung base. Additional patchy consolidation with tree-in-bud nodularity is present in the right upper lobe as well. Some mosaic attenuation may reflect further small airways disease or air trapping. Evaluation for concerning  pulmonary nodules and masses limited due to extensive respiratory motion. No pneumothorax. No visible effusion. Upper Abdomen: Large hiatal hernia as above. No acute abnormalities present in the visualized portions of the upper abdomen. Musculoskeletal: Exaggerated thoracic kyphosis with increased AP diameter of the chest. Mild dextrocurvature of the thoracic spine noted as well. Multilevel degenerative changes are present in the imaged portions of the spine. No acute osseous abnormality or suspicious osseous lesion. Review of the MIP images confirms the above findings. IMPRESSION: 1. No evidence of acute central, lobar or proximal segmental pulmonary embolism. More distal evaluation may be limited by extensive respiratory motion artifact. 2. Diffusely thickened airways with many of the lung bases appearing fluid-filled likely reflecting features of aspiration, likely chronic given chronic architectural distortion and scarring throughout both lungs. More acute areas of airspace disease in the right lower lobe and upper lobe. 3. Mosaic attenuation may reflect further small airways disease or air trapping. 4. Right heart enlargement and reflux of contrast into the hepatic veins and IVC may reflect elevated right heart pressure/right heart failure. 5. Large hiatal hernia. 6. Aortic Atherosclerosis (ICD10-I70.0). Electronically Signed   By: Lovena Le M.D.   On: 07/02/2019 22:23   DG CHEST PORT 1 VIEW  Result Date: 07/05/2019 CLINICAL DATA:  86 year old female with bronchiectasis. EXAM: PORTABLE CHEST 1 VIEW COMPARISON:  Chest CT dated 07/02/2019 and chest radiograph dated 07/02/2019. FINDINGS: Right upper lobe nodular density as well as smaller densities in the left upper lobe. There is a patchy area of airspace opacity in the right infrahilar region likely corresponding to the density seen on the prior CT in the right lower lobe. Left lung base opacity appears similar to prior radiograph and corresponds to the  consolidative changes of the lingula and left lower lobe. Probable small bilateral pleural effusions. No pneumothorax. Stable cardiomediastinal silhouette. Moderate size hiatal hernia. No acute osseous pathology. IMPRESSION: 1. Bilateral upper lobe and lower lobe consolidative changes as seen on the prior CT. 2. Probable small bilateral  pleural effusions.  No pneumothorax. Electronically Signed   By: Anner Crete M.D.   On: 07/05/2019 16:17   DG Chest Port 1 View  Result Date: 07/02/2019 CLINICAL DATA:  86 year old with cough and shortness of breath. EXAM: PORTABLE CHEST 1 VIEW COMPARISON:  Radiograph 02/09/2019.  Chest CT 01/26/2017 FINDINGS: Unchanged heart size and mediastinal contours. Retrocardiac hiatal hernia. Blunting of the left costophrenic angle appears chronic and related to scarring. Stable scarring in the perifissural right upper lobe. No evidence of acute or focal airspace disease. No pulmonary edema, pneumothorax, or large pleural effusion. Scoliotic curvature of the spine with stable osseous structures. IMPRESSION: 1. No acute abnormality.  Bilateral lung scarring. 2. Hiatal hernia. Electronically Signed   By: Keith Rake M.D.   On: 07/02/2019 19:00    Lab Results:  CBC    Component Value Date/Time   WBC 12.2 (H) 07/09/2019 0559   RBC 4.28 07/09/2019 0559   HGB 13.9 07/09/2019 0559   HCT 43.7 07/09/2019 0559   PLT 328 07/09/2019 0559   MCV 102.1 (H) 07/09/2019 0559   MCH 32.5 07/09/2019 0559   MCHC 31.8 07/09/2019 0559   RDW 12.1 07/09/2019 0559   LYMPHSABS 1.4 07/07/2019 0728   MONOABS 0.9 07/07/2019 0728   EOSABS 0.1 07/07/2019 0728   BASOSABS 0.0 07/07/2019 0728    BMET    Component Value Date/Time   NA 138 07/09/2019 0559   NA 139 03/25/2017 1216   K 3.1 (L) 07/09/2019 0559   CL 98 07/09/2019 0559   CO2 32 07/09/2019 0559   GLUCOSE 96 07/09/2019 0559   BUN 12 07/09/2019 0559   BUN 14 03/25/2017 1216   CREATININE 0.72 07/09/2019 0559   CALCIUM 8.4  (L) 07/09/2019 0559   GFRNONAA >60 07/09/2019 0559   GFRAA >60 07/09/2019 0559    BNP    Component Value Date/Time   BNP 183.9 (H) 07/02/2019 1807    ProBNP    Component Value Date/Time   PROBNP 570.4 (H) 08/01/2012 2137    Specialty Problems      Pulmonary Problems   Allergic rhinitis    Qualifier: Diagnosis of  By: Lenn Cal Deborra Medina), Susanne        Asthma    Qualifier: Diagnosis of  By: Thornton Papas), Susanne        Cough    Qualifier: Diagnosis of  By: Sarah Sakai MD, Rose Fillers       BRONCHIECTASIS    Qualifier: Diagnosis of  By: Sarah Sakai MD, Rose Fillers       Shortness of breath dyspnea   Hemoptysis   Community acquired pneumonia   HCAP (healthcare-associated pneumonia)   Pneumonia of left lung due to infectious organism   Pleural effusion   Acute respiratory failure with hypoxia (HCC)   Aspiration pneumonia of both lungs (HCC)      Allergies  Allergen Reactions  . Codeine Anaphylaxis  . Hydrocodone Nausea And Vomiting and Other (See Comments)    SYNCOPE AND BRADYCARDIA  . Isosorbide Nitrate Other (See Comments)    BRADYCARDIA  . Oxycodone Palpitations    Rapid heart beat  . Clarithromycin Nausea And Vomiting    Has patient had a PCN reaction causing immediate rash, facial/tongue/throat swelling, SOB or lightheadedness with hypotension: Unknown Has patient had a PCN reaction causing severe rash involving mucus membranes or skin necrosis: Unknown Has patient had a PCN reaction that required hospitalization: Unknown Has patient had a PCN reaction occurring within the last 10 years: Unknown If  all of the above answers are "NO", then may proceed with Cephalosporin use.   . Fiorinal [Butalbital-Aspirin-Caffeine] Swelling and Other (See Comments)    Facial swelling   . Levofloxacin Nausea And Vomiting and Other (See Comments)    SYNCOPE ALSO  . Augmentin [Amoxicillin-Pot Clavulanate] Diarrhea  . Doxycycline     Sweats and aches    Immunization History   Administered Date(s) Administered  . Fluad Quad(high Dose 65+) 10/08/2018  . Influenza Split 10/15/2011, 10/14/2012  . Influenza Whole 10/17/2008, 11/14/2009, 11/13/2010  . Influenza, High Dose Seasonal PF 11/21/2015, 10/29/2016, 09/22/2017  . Influenza,inj,Quad PF,6+ Mos 09/17/2013, 10/07/2014  . Moderna SARS-COVID-2 Vaccination 02/04/2019, 03/04/2019  . Pneumococcal Polysaccharide-23 02/19/1999    Past Medical History:  Diagnosis Date  . Allergic rhinitis   . Anxiety    Anxiety attacks  . Arthritis    "a little; in my back and hands" (04/15/2012)  . Asthma   . Bronchiectasis    with history of Legionaires with chronic rales/rhonchi  . Chest pain at rest   . Daily headache    "over the last week" 04/15/2012   . Depression   . Diastolic dysfunction    a. per echo 08/2010 with normal LV function;  b. 06/2011 Echo: EF 65-70%  . GERD (gastroesophageal reflux disease)    "just recently" (04/15/2012)  . H/O hiatal hernia   . H/O Legionnaire's disease 11/1976  . History of blood transfusion 1972   "w/hysterectomy" (04/15/2012)  . Hypercholesteremia   . Hypertension    Negative renal duplex in December of 2012  . Hypothyroidism   . Idiopathic peripheral neuropathy   . MAC (mycobacterium avium-intracellulare complex)   . Migraines    "outgrew them" (04/15/2012)  . NSTEMI (non-ST elevated myocardial infarction) ([place])    a. Normal coronaries per cath August 2012 and negative CT angio for PE;  b. 06/2011 Repeat admission w/ chest pain and elevated troponin's, CTA Chest w/o PE  . Osteopenia 02/2011   t score - 2.1  . Pneumonia    "recurrent" (04/15/2012)  . Pollen allergies   . Shortness of breath    "sometimes just lying down" (04/15/2012)  . Stroke ([place]) 06/2014   cerebellum    Tobacco History: Social History   Tobacco Use  Smoking Status Never Smoker  Smokeless Tobacco Never Used   Counseling given: Yes   Continue to not smoke  Outpatient Encounter Medications as of 07/23/2019   Medication Sig  . acetaminophen (TYLENOL) 500 MG tablet Take 500 mg by mouth every 6 (six) hours as needed for moderate pain.  Marland Kitchen albuterol (PROVENTIL) (2.5 MG/3ML) 0.083% nebulizer solution USE 1 VIAL VIA NEBULIZER TWICE DAILY (Patient taking differently: Take 2.5 mg by nebulization every 6 (six) hours as needed for wheezing or shortness of breath. )  . ALPHAGAN P 0.1 % SOLN Place 1 drop into the left eye every 8 (eight) hours.   . ALPRAZolam (XANAX) 0.5 MG tablet Take 1 tablet by mouth as directed. Take 1 tablet (0.5 mg) BID at (8 am & 8 pm) & DAILY PRN FOR ANXIETY  . amLODipine (NORVASC) 10 MG tablet Take 0.5 tablets (5 mg total) by mouth daily as needed. May take an additional 0.5 mg tablet as needed for persistent elevated BP  . calcium carbonate (TUMS - DOSED IN MG ELEMENTAL CALCIUM) 500 MG chewable tablet Chew 1 tablet by mouth every 8 (eight) hours as needed for indigestion or heartburn.  . carvedilol (COREG) 6.25 MG tablet TAKE 1 TABLET(6.25  MG) BY MOUTH TWICE DAILY  . clopidogrel (PLAVIX) 75 MG tablet TAKE 1 TABLET(75 MG) BY MOUTH DAILY (Patient taking differently: Take 75 mg by mouth daily. )  . DULoxetine (CYMBALTA) 20 MG capsule Take 20 mg by mouth 3 (three) times daily.  Marland Kitchen escitalopram (LEXAPRO) 5 MG tablet Take 5 mg by mouth daily.   . Iron-FA-B Cmp-C-Biot-Probiotic (FUSION PLUS PO) Take 1 tablet by mouth daily.  Marland Kitchen levothyroxine (SYNTHROID, LEVOTHROID) 50 MCG tablet Take 50 mcg by mouth daily.    Marland Kitchen loratadine (CLARITIN) 10 MG tablet Take 10 mg by mouth daily.  . NONFORMULARY OR COMPOUNDED ITEM  Apothecary:  Neuropathy Cream #11 - Bupivacaine 1%, Doxepin 3%, Gabapentin 6%, Pentoxifylline 3%, Topiramate 1%. Apply 1-2 grams to affected areas 3-4 times a day.  . pantoprazole (PROTONIX) 40 MG tablet Take 40 mg by mouth 2 (two) times daily.  . predniSONE (DELTASONE) 10 MG tablet TAKE 1 TABLET(10 MG) BY MOUTH DAILY WITH BREAKFAST  . ROCKLATAN 0.02-0.005 % SOLN Apply 1 drop to eye at  bedtime.  . saccharomyces boulardii (FLORASTOR) 250 MG capsule Take 1 capsule (250 mg total) by mouth 2 (two) times daily for 15 days.  . Sennosides (SENOKOT PO) Take 1 tablet by mouth daily as needed (constipation).   Marland Kitchen SIMBRINZA 1-0.2 % SUSP Place 1 drop into the left eye in the morning, at noon, and at bedtime.    No facility-administered encounter medications on file as of 07/23/2019.     Review of Systems  Review of Systems  Constitutional: Positive for activity change and fatigue. Negative for fever.  HENT: Negative for sinus pressure, sinus pain and sore throat.   Respiratory: Positive for cough. Negative for shortness of breath and wheezing.   Cardiovascular: Negative for chest pain and palpitations.  Gastrointestinal: Negative for diarrhea, nausea and vomiting.  Musculoskeletal: Negative for arthralgias.  Neurological: Negative for dizziness.  Psychiatric/Behavioral: Negative for sleep disturbance. The patient is not nervous/anxious.      Physical Exam  BP (!) 142/80 (BP Location: Left Arm, Cuff Size: Normal)   Pulse 72   Temp 97.8 F (36.6 C) (Oral)   Ht 5' 3.78" (1.62 m)   Wt 119 lb (54 kg)   LMP 01/14/1970   SpO2 93%   BMI 20.57 kg/m   Wt Readings from Last 5 Encounters:  07/23/19 119 lb (54 kg)  07/03/19 126 lb 8.7 oz (57.4 kg)  06/10/19 122 lb 12.8 oz (55.7 kg)  02/05/19 117 lb (53.1 kg)  12/16/18 140 lb (63.5 kg)    BMI Readings from Last 5 Encounters:  07/23/19 20.57 kg/m  07/03/19 20.42 kg/m  06/10/19 19.82 kg/m  02/05/19 20.08 kg/m  12/16/18 24.03 kg/m     Physical Exam Vitals and nursing note reviewed.  Constitutional:      General: She is not in acute distress.    Comments: thin frail elderly female  HENT:     Head: Normocephalic and atraumatic.     Right Ear: External ear normal.     Left Ear: External ear normal.     Nose: Nose normal. No congestion.     Mouth/Throat:     Mouth: Mucous membranes are moist.     Pharynx:  Oropharynx is clear.  Eyes:     Pupils: Pupils are equal, round, and reactive to light.  Cardiovascular:     Rate and Rhythm: Normal rate and regular rhythm.     Pulses: Normal pulses.     Heart sounds: Normal heart  sounds. No murmur heard.   Pulmonary:     Effort: Pulmonary effort is normal. No respiratory distress.     Breath sounds: No decreased air movement. Examination of the right-upper field reveals rhonchi. Examination of the left-upper field reveals rhonchi. Rhonchi present. No decreased breath sounds, wheezing or rales.     Comments: scattered squeaks, noisy chest Kyphosis Abdominal:     General: Abdomen is flat. Bowel sounds are normal.     Palpations: Abdomen is soft.  Musculoskeletal:     Cervical back: Normal range of motion.  Skin:    General: Skin is warm and dry.     Capillary Refill: Capillary refill takes less than 2 seconds.  Neurological:     General: No focal deficit present.     Mental Status: She is alert and oriented to person, place, and time. Mental status is at baseline.     Gait: Gait normal.  Psychiatric:        Mood and Affect: Mood normal.        Behavior: Behavior normal.        Thought Content: Thought content normal.        Judgment: Judgment normal.       Assessment & Plan:   Pneumonia of left lung due to infectious organism Recent hospitalization for bronchiectatic exacerbation Maintained on maintenance rounds of antibiotic Known bronchiectasis Appears clinically improved We will obtain chest x-ray imaging next week to monitor for improved aeration of the lung   BRONCHIECTASIS Discussion: Reviewed bronchitic management Emphasized importance of mucociliary clearance with therapy vest, flutter valve  Plan: Continue prednisone 10 mg daily Continue rotating antibiotics Omnicef, azithromycin for the 1st week of each month Continue therapy vest 2 times a day Continue albuterol nebulized meds 3 times daily On days we are unable to  use her therapy vest please use flutter valve Chest x-ray next week Keep appointment with Dr. Lamonte [name] later on this month  Asthma Plan: Continue albuterol nebs  Aspiration pneumonia of both lungs ([place]) Reviewed swallow study recommendations with patient as well as daughter  Plan: Continue to follow swallow study recommendations from hospitalization to reduce risk of aspiration pneumonia  Adrenal insufficiency (HCC) History of adrenal insufficiency  Plan: Continue 10 mg of prednisone daily  Pseudomonas aeruginosa colonization Previous colonizations of Pseudomonas Pending sputum from recent hospitalization  Plan: Follow sputum results from the hospital      Return in about 19 days (around 08/11/2019), or if symptoms worsen or fail to improve, for Follow up with Dr. Lamonte [name].   Lauraine Rinne, NP 07/23/2019   This appointment required 45 minutes of patient care (this includes precharting, chart review, review of results, face-to-face care, etc.).

## 2019-07-23 NOTE — Assessment & Plan Note (Signed)
Discussion: Reviewed bronchitic management Emphasized importance of mucociliary clearance with therapy vest, flutter valve  Plan: Continue prednisone 10 mg daily Continue rotating antibiotics Omnicef, azithromycin for the 1st week of each month Continue therapy vest 2 times a day Continue albuterol nebulized meds 3 times daily On days we are unable to use her therapy vest please use flutter valve Chest x-ray next week Keep appointment with Dr. Delton Coombes later on this month

## 2019-07-23 NOTE — Assessment & Plan Note (Signed)
Plan: Continue albuterol nebs

## 2019-07-23 NOTE — Assessment & Plan Note (Signed)
Previous colonizations of Pseudomonas Pending sputum from recent hospitalization  Plan: Follow sputum results from the hospital

## 2019-07-25 ENCOUNTER — Encounter: Payer: Self-pay | Admitting: Cardiovascular Disease

## 2019-07-25 NOTE — Progress Notes (Signed)
Sarah [name] Date of Birth: Jun 25, 1921 Medical Record #[id number]  Problem list: 1. Hypertension 2. Asthma 3. Depression 4. Coronary artery disease-status post non-ST segment elevation myocardial infarction. Cardiac cath revealed nonobstructive coronary artery disease 5. Bronchiectasis / atypical mycobacterium infection    Sarah [name] is seen back today following a recent hospitalization. She was admitted to the hospital with chest tightness. She ruled out for myocardial infarction. She has a history of chest pain in the past and a cardiac catheterization in 2012 was unremarkable.  She originally called EMS when her BP was 220/110  She has had significant difficulty controlling her BP recently.   She has been watching her salt.  She has taken amlodipine and Lisinopril in the past but these have been stopped because she seemed to be overmedicated.    She continues to have intermittent episodes of chest pressure and heaviness.  These are typically associated with marked hypertension although it is not clear whether the hypertension occurs first or as a result of her  feeling poorly.  Jun 11, 2012:  Sarah [name] was seen by Truitt Merle in February, 2014. She had been measuring her blood pressure at least 10 times every day and was worried because her blood pressure seemed to be somewhat labile.  She feels poorly today.  She did not sleep well last night.  She had a headache.   She has lots of anxiety issues.  She takes xanax with some relief.    June 2 , 2015:  Sarah [name] is doing ok.  Upset about having to put her husband in an memory care SNF.  Lots of guilt about that.    Has a home sitter with her today. No significant CP or dyspnea.  She has some anxiety - has attacks on occasion.  Her anxiety seems to start in her chest as a CP.  If she takes the xanax and Tylenol #3 in time, she can avoid the panic attacks.    She thinks she had 3 TIAs this past year.  Carotid duplex was negative.   MRI was normal.    She has been on 1 ASA 325 mg a day.   Jan. 31, 2017:  Patient is ding well.   BP and HR look great today Still short of breath .  Has pulmonary issues.   Sees [name] .   Jan. 30, 2018:    Doing well from a cardiac .    Just getting over an episode of cellulitis and lymphedema .  No further CP   BP has been a little high.  She thinks she may have had a TIA on Saturday .    She felt strange in her head,  Could not speak,  Had double vision,  Took her BP - 199/110  May 27, 2017:  [name] is seen today for follow-up visit.  She has a history of coronary artery disease. She had a TIA earlier this year.  Echocardiogram in January, 2019 reveals a vigorous left ventricular systolic function with EF of 65 to 70%.  She has grade 1 diastolic dysfunction.  No CP . Has COPD  HR is a little fast but over all is doing well   Nov. 4, 2020 :  Seen with Vira Blanco ( daughter )  Sarah [name] is seen today for follow-up of her coronary artery disease.  She has a history of myocardial infarction.  She had a non-ST segment elevation myocardial infarction many years ago.  Heart catheterization  revealed nonobstructive coronary artery disease.  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  [name] have made some med changes Was stopped Losartan and  HCTZ due to low BP  At a previous telemedicine visit  She has felt better since [name] stopped the Losartan and HCTZ   BP has been variable  Sarah [name] discussed the fact that blood pressure variability is almost always variability in dietary intake of salt.  She lives in an assisted living situation and does not have any control of her salt intake.  [name] discussed that she can take an extra 1/2  carvedilol if her blood pressure becomes markedly elevated assuming that her heart rate is fast enough.  July 26, 2019  Sarah [name] is seen for follow up of HTN, CAD She is now being seen by palliative care She has normal left ventricular  systolic function by echo in January, 2019.  She has grade 1 diastolic dysfunction.    Current Outpatient Medications on File Prior to Visit  Medication Sig Dispense Refill  . acetaminophen (TYLENOL) 500 MG tablet Take 500 mg by mouth every 6 (six) hours as needed for moderate pain.    Marland Kitchen albuterol (PROVENTIL) (2.5 MG/3ML) 0.083% nebulizer solution USE 1 VIAL VIA NEBULIZER TWICE DAILY (Patient taking differently: Take 2.5 mg by nebulization every 6 (six) hours as needed for wheezing or shortness of breath. ) 720 mL 3  . ALPHAGAN P 0.1 % SOLN Place 1 drop into the left eye every 8 (eight) hours.     . ALPRAZolam (XANAX) 0.5 MG tablet Take 1 tablet by mouth as directed. Take 1 tablet (0.5 mg) BID at (8 am & 8 pm) & DAILY PRN FOR ANXIETY    . amLODipine (NORVASC) 10 MG tablet Take 0.5 tablets (5 mg total) by mouth daily as needed. May take an additional 0.5 mg tablet as needed for persistent elevated BP 45 tablet 3  . calcium carbonate (TUMS - DOSED IN MG ELEMENTAL CALCIUM) 500 MG chewable tablet Chew 1 tablet by mouth every 8 (eight) hours as needed for indigestion or heartburn.    . carvedilol (COREG) 6.25 MG tablet TAKE 1 TABLET(6.25 MG) BY MOUTH TWICE DAILY 180 tablet 2  . clopidogrel (PLAVIX) 75 MG tablet TAKE 1 TABLET(75 MG) BY MOUTH DAILY (Patient taking differently: Take 75 mg by mouth daily. ) 30 tablet 7  . DULoxetine (CYMBALTA) 20 MG capsule Take 20 mg by mouth 3 (three) times daily.    Marland Kitchen escitalopram (LEXAPRO) 5 MG tablet Take 5 mg by mouth daily.     . Iron-FA-B Cmp-C-Biot-Probiotic (FUSION PLUS PO) Take 1 tablet by mouth daily.    Marland Kitchen levothyroxine (SYNTHROID, LEVOTHROID) 50 MCG tablet Take 50 mcg by mouth daily.      Marland Kitchen loratadine (CLARITIN) 10 MG tablet Take 10 mg by mouth daily.    . NONFORMULARY OR COMPOUNDED ITEM [facility]:  Neuropathy Cream #11 - Bupivacaine 1%, Doxepin 3%, Gabapentin 6%, Pentoxifylline 3%, Topiramate 1%. Apply 1-2 grams to affected areas 3-4 times a day.  100 each 5  . pantoprazole (PROTONIX) 40 MG tablet Take 40 mg by mouth 2 (two) times daily.    . predniSONE (DELTASONE) 10 MG tablet TAKE 1 TABLET(10 MG) BY MOUTH DAILY WITH BREAKFAST 30 tablet 5  . ROCKLATAN 0.02-0.005 % SOLN Apply 1 drop to eye at bedtime.    . Sennosides (SENOKOT PO) Take 1 tablet by mouth daily as needed (constipation).     Marland Kitchen SIMBRINZA 1-0.2 % SUSP  Place 1 drop into the left eye in the morning, at noon, and at bedtime.      No current facility-administered medications on file prior to visit.    Allergies  Allergen Reactions  . Codeine Anaphylaxis  . Hydrocodone Nausea And Vomiting and Other (See Comments)    SYNCOPE AND BRADYCARDIA  . Isosorbide Nitrate Other (See Comments)    BRADYCARDIA  . Oxycodone Palpitations    Rapid heart beat  . Clarithromycin Nausea And Vomiting    Has patient had a PCN reaction causing immediate rash, facial/tongue/throat swelling, SOB or lightheadedness with hypotension: Unknown Has patient had a PCN reaction causing severe rash involving mucus membranes or skin necrosis: Unknown Has patient had a PCN reaction that required hospitalization: Unknown Has patient had a PCN reaction occurring within the last 10 years: Unknown If all of the above answers are "NO", then may proceed with Cephalosporin use.   . Fiorinal [Butalbital-Aspirin-Caffeine] Swelling and Other (See Comments)    Facial swelling   . Levofloxacin Nausea And Vomiting and Other (See Comments)    SYNCOPE ALSO  . Augmentin [Amoxicillin-Pot Clavulanate] Diarrhea  . Doxycycline     Sweats and aches    Past Medical History:  Diagnosis Date  . Allergic rhinitis   . Anxiety    Anxiety attacks  . Arthritis    "a little; in my back and hands" (04/15/2012)  . Asthma   . Bronchiectasis    with history of Legionaires with chronic rales/rhonchi  . Chest pain at rest   . Daily headache    "over the last week" 04/15/2012   . Depression   . Diastolic dysfunction    a. per  echo 08/2010 with normal LV function;  b. 06/2011 Echo: EF 65-70%  . GERD (gastroesophageal reflux disease)    "just recently" (04/15/2012)  . H/O hiatal hernia   . H/O Legionnaire's disease 11/1976  . History of blood transfusion 1972   "w/hysterectomy" (04/15/2012)  . Hypercholesteremia   . Hypertension    Negative renal duplex in December of 2012  . Hypothyroidism   . Idiopathic peripheral neuropathy   . MAC (mycobacterium avium-intracellulare complex)   . Migraines    "outgrew them" (04/15/2012)  . NSTEMI (non-ST elevated myocardial infarction) ([place])    a. Normal coronaries per cath August 2012 and negative CT angio for PE;  b. 06/2011 Repeat admission w/ chest pain and elevated troponin's, CTA Chest w/o PE  . Osteopenia 02/2011   t score - 2.1  . Pneumonia    "recurrent" (04/15/2012)  . Pollen allergies   . Shortness of breath    "sometimes just lying down" (04/15/2012)  . Stroke [facility]) 06/2014   cerebellum    Past Surgical History:  Procedure Laterality Date  . APPENDECTOMY    . CARDIAC CATHETERIZATION  August 2012   Normal coronaries.  Marland Kitchen DILATION AND CURETTAGE OF UTERUS    . TONSILLECTOMY  1930  . TOTAL ABDOMINAL HYSTERECTOMY W/ BILATERAL SALPINGOOPHORECTOMY  1972   leiomyomata, menorrhagia    Social History   Tobacco Use  Smoking Status Never Smoker  Smokeless Tobacco Never Used    Social History   Substance and Sexual Activity  Alcohol Use Yes  . Alcohol/week: 2.0 standard drinks  . Types: 2 Glasses of wine per week   Comment: wine 1-2 week    Family History  Problem Relation Age of Onset  . Hypertension Mother        stroke  . Stroke Mother   .  Cancer Brother        prostate  . Osteoarthritis Paternal Aunt     Review of Systems: The review of systems is per the HPI.  All other systems were reviewed and are negative.   Physical Exam: Last menstrual period 01/14/1970.  GEN:   Very frail, elderly female, no acute distress HEENT: Normal NECK: No JVD;  No carotid bruits LYMPHATICS: No lymphadenopathy CARDIAC: RRR , no murmurs, rubs, gallops RESPIRATORY:   bilat coarse rhonchi, wheezes.  - lots of congestion ABDOMEN: Soft, non-tender, non-distended MUSCULOSKELETAL:  No edema; No deformity  SKIN: Warm and dry NEUROLOGIC:  Alert and oriented x 3    LABORATORY DATA:  Lab Results  Component Value Date   WBC 12.2 (H) 07/09/2019   HGB 13.9 07/09/2019   HCT 43.7 07/09/2019   PLT 328 07/09/2019   GLUCOSE 96 07/09/2019   CHOL 185 12/18/2018   TRIG 66 07/09/2014   HDL 67 07/09/2014   LDLCALC 99 07/09/2014   ALT 14 12/17/2018   AST 21 12/17/2018   NA 138 07/09/2019   K 3.1 (L) 07/09/2019   CL 98 07/09/2019   CREATININE 0.72 07/09/2019   BUN 12 07/09/2019   CO2 32 07/09/2019   TSH 0.522 01/26/2017   INR 1.10 07/08/2014   HGBA1C 6.0 (H) 07/09/2014   ECG: November 18, 2018: Normal sinus rhythm at 93.  Nonspecific T wave abnormality.    Assessment / Plan:  1. Hypertension-   She is very stable It is becoming harder and harder  for her to come to the office.   [name] are not making any significant medication adjustments I will ask Dr. Stephanie Acre if she will assume responsibility for  the prescriptions for amlodipine and coreg.  I will see her as needed or if she has a significant cardiac issue    2. Possible TIA :      3. Depression  4. Coronary artery disease-   No angina   5. Bronchiectasis / atypical mycobacterium infection      Sarah Moores, MD  07/25/2019 7:56 PM    [address],  [place], [place]  [id number] Pager [phone] Phone: [phone]; Fax: [phone]

## 2019-07-26 ENCOUNTER — Telehealth: Payer: Self-pay

## 2019-07-26 ENCOUNTER — Ambulatory Visit (INDEPENDENT_AMBULATORY_CARE_PROVIDER_SITE_OTHER): Payer: Medicare Other | Admitting: Cardiovascular Disease

## 2019-07-26 ENCOUNTER — Encounter: Payer: Self-pay | Admitting: Cardiovascular Disease

## 2019-07-26 ENCOUNTER — Other Ambulatory Visit: Payer: Self-pay | Admitting: Pulmonary Disease

## 2019-07-26 ENCOUNTER — Other Ambulatory Visit: Payer: Self-pay

## 2019-07-26 VITALS — BP 152/100 | HR 100 | Ht 63.0 in | Wt 123.2 lb

## 2019-07-26 DIAGNOSIS — I10 Essential (primary) hypertension: Secondary | ICD-10-CM | POA: Diagnosis not present

## 2019-07-26 DIAGNOSIS — Z20828 Contact with and (suspected) exposure to other viral communicable diseases: Secondary | ICD-10-CM | POA: Diagnosis not present

## 2019-07-26 DIAGNOSIS — R262 Difficulty in walking, not elsewhere classified: Secondary | ICD-10-CM | POA: Diagnosis not present

## 2019-07-26 DIAGNOSIS — M6281 Muscle weakness (generalized): Secondary | ICD-10-CM | POA: Diagnosis not present

## 2019-07-26 DIAGNOSIS — I5032 Chronic diastolic (congestive) heart failure: Secondary | ICD-10-CM | POA: Diagnosis not present

## 2019-07-26 DIAGNOSIS — Z1159 Encounter for screening for other viral diseases: Secondary | ICD-10-CM | POA: Diagnosis not present

## 2019-07-26 DIAGNOSIS — R2681 Unsteadiness on feet: Secondary | ICD-10-CM | POA: Diagnosis not present

## 2019-07-26 DIAGNOSIS — J441 Chronic obstructive pulmonary disease with (acute) exacerbation: Secondary | ICD-10-CM | POA: Diagnosis not present

## 2019-07-26 NOTE — Telephone Encounter (Signed)
Arlys John sent in an order for an xray to be done this week, 07/26/19-07/30/2019. I spoke with the daughter Sharyl Nimrod and she will take her mom to [place] office for xray on Friday, 07/30/2019 due to no xray being in office on that day. Daughter verbalized understanding and nothing further was needed.

## 2019-07-26 NOTE — Patient Instructions (Signed)
Medication Instructions:  Your physician recommends that you continue on your current medications as directed. Please refer to the Current Medication list given to you today.  *If you need a refill on your cardiac medications before your next appointment, please call your pharmacy*   Lab Work: None Ordered If you have labs (blood work) drawn today and your tests are completely normal, you will receive your results only by: Marland Kitchen MyChart Message (if you have MyChart) OR . A paper copy in the mail If you have any lab test that is abnormal or we need to change your treatment, we will call you to review the results.   Testing/Procedures: None Ordered   Follow-Up: At [facility], you and your health needs are our priority.  As part of our continuing mission to provide you with exceptional heart care, we have created designated Provider Care Teams.  These Care Teams include your primary Cardiologist (physician) and Advanced Practice Providers (APPs -  Physician Assistants and Nurse Practitioners) who all work together to provide you with the care you need, when you need it.   Your next appointment:    As Needed  The format for your next appointment:   Either In Person or Virtual  Provider:   You may see Kristeen Miss, MD or one of the following Advanced Practice Providers on your designated Care Team:    Tereso Newcomer, PA-C  [place], [place]

## 2019-07-28 DIAGNOSIS — J441 Chronic obstructive pulmonary disease with (acute) exacerbation: Secondary | ICD-10-CM | POA: Diagnosis not present

## 2019-07-28 DIAGNOSIS — M6281 Muscle weakness (generalized): Secondary | ICD-10-CM | POA: Diagnosis not present

## 2019-07-28 DIAGNOSIS — R262 Difficulty in walking, not elsewhere classified: Secondary | ICD-10-CM | POA: Diagnosis not present

## 2019-07-28 DIAGNOSIS — R2681 Unsteadiness on feet: Secondary | ICD-10-CM | POA: Diagnosis not present

## 2019-07-30 ENCOUNTER — Other Ambulatory Visit: Payer: Self-pay

## 2019-07-30 ENCOUNTER — Ambulatory Visit (INDEPENDENT_AMBULATORY_CARE_PROVIDER_SITE_OTHER)
Admission: RE | Admit: 2019-07-30 | Discharge: 2019-07-30 | Disposition: A | Discharge status: Home / Self Care | Payer: Medicare Other | Source: Ambulatory Visit | Attending: Pulmonary Disease | Admitting: Pulmonary Disease

## 2019-07-30 DIAGNOSIS — J690 Pneumonitis due to inhalation of food and vomit: Secondary | ICD-10-CM

## 2019-07-30 DIAGNOSIS — K449 Diaphragmatic hernia without obstruction or gangrene: Secondary | ICD-10-CM | POA: Diagnosis not present

## 2019-07-30 DIAGNOSIS — R911 Solitary pulmonary nodule: Secondary | ICD-10-CM | POA: Diagnosis not present

## 2019-07-30 DIAGNOSIS — J189 Pneumonia, unspecified organism: Secondary | ICD-10-CM

## 2019-07-30 DIAGNOSIS — J90 Pleural effusion, not elsewhere classified: Secondary | ICD-10-CM | POA: Diagnosis not present

## 2019-07-30 DIAGNOSIS — J479 Bronchiectasis, uncomplicated: Secondary | ICD-10-CM | POA: Diagnosis not present

## 2019-08-02 DIAGNOSIS — Z20828 Contact with and (suspected) exposure to other viral communicable diseases: Secondary | ICD-10-CM | POA: Diagnosis not present

## 2019-08-02 DIAGNOSIS — Z1159 Encounter for screening for other viral diseases: Secondary | ICD-10-CM | POA: Diagnosis not present

## 2019-08-03 DIAGNOSIS — R262 Difficulty in walking, not elsewhere classified: Secondary | ICD-10-CM | POA: Diagnosis not present

## 2019-08-03 DIAGNOSIS — J441 Chronic obstructive pulmonary disease with (acute) exacerbation: Secondary | ICD-10-CM | POA: Diagnosis not present

## 2019-08-03 DIAGNOSIS — M6281 Muscle weakness (generalized): Secondary | ICD-10-CM | POA: Diagnosis not present

## 2019-08-03 DIAGNOSIS — R2681 Unsteadiness on feet: Secondary | ICD-10-CM | POA: Diagnosis not present

## 2019-08-05 DIAGNOSIS — J441 Chronic obstructive pulmonary disease with (acute) exacerbation: Secondary | ICD-10-CM | POA: Diagnosis not present

## 2019-08-05 DIAGNOSIS — M6281 Muscle weakness (generalized): Secondary | ICD-10-CM | POA: Diagnosis not present

## 2019-08-05 DIAGNOSIS — R2681 Unsteadiness on feet: Secondary | ICD-10-CM | POA: Diagnosis not present

## 2019-08-05 DIAGNOSIS — R262 Difficulty in walking, not elsewhere classified: Secondary | ICD-10-CM | POA: Diagnosis not present

## 2019-08-06 NOTE — Progress Notes (Signed)
I spoke with the pt's daughter OK per DPR and notified of results and recs and she verbalized understanding.

## 2019-08-09 DIAGNOSIS — R262 Difficulty in walking, not elsewhere classified: Secondary | ICD-10-CM | POA: Diagnosis not present

## 2019-08-09 DIAGNOSIS — M6281 Muscle weakness (generalized): Secondary | ICD-10-CM | POA: Diagnosis not present

## 2019-08-09 DIAGNOSIS — J441 Chronic obstructive pulmonary disease with (acute) exacerbation: Secondary | ICD-10-CM | POA: Diagnosis not present

## 2019-08-09 DIAGNOSIS — R2681 Unsteadiness on feet: Secondary | ICD-10-CM | POA: Diagnosis not present

## 2019-08-09 DIAGNOSIS — Z1159 Encounter for screening for other viral diseases: Secondary | ICD-10-CM | POA: Diagnosis not present

## 2019-08-09 DIAGNOSIS — Z20828 Contact with and (suspected) exposure to other viral communicable diseases: Secondary | ICD-10-CM | POA: Diagnosis not present

## 2019-08-11 ENCOUNTER — Ambulatory Visit (INDEPENDENT_AMBULATORY_CARE_PROVIDER_SITE_OTHER): Payer: Medicare Other | Admitting: Emergency Medicine

## 2019-08-11 ENCOUNTER — Other Ambulatory Visit: Payer: Self-pay

## 2019-08-11 ENCOUNTER — Encounter: Payer: Self-pay | Admitting: Emergency Medicine

## 2019-08-11 DIAGNOSIS — J479 Bronchiectasis, uncomplicated: Secondary | ICD-10-CM

## 2019-08-11 NOTE — Progress Notes (Signed)
   Subjective:    Patient ID: Sarah [name], female    DOB: [birth year], 86 y.o.   MRN: [id number]  HPI  ROV 06/10/19 --follow-up visit for Sarah [name], 86 year old woman with chronic cough in the setting of bronchiectasis (Pseudomonas colonized), chronic asthmatic bronchitis.  She also has superimposed upper airway disease which contributes to daily cough.  I have managed her on prednisone 10 mg daily, now also rotating antibiotics with azithromycin and Omnicef.  She also uses smart vest 1-2x a day, scheduled albuterol 3 times a day.  She reports that she continues to slowly feel better. Better exertional tolerance. No fever. No flares since last time. COVID 19 vaccine up to date. Remains on protonix bid.    ROV 08/11/19 --pleasant 86 year old woman with significant bronchiectasis, pseudomonal colonization, chronic asthmatic bronchitis with upper airway irritation syndrome.  She was last seen here 7/9 following a hospitalization 07/02/2019 for exacerbation of her bronchiectasis and possible pneumonia. Currently maintained on prednisone 10 mg daily, rotating antibiotics (Zithromax, Omnicef), smart vest, scheduled albuterol nebs, Protonix.  Currently on omnicef. She is feeling a bit weak, more tired lately.  She is coughing up clear mucous currently.   Chest x-ray 07/31/2019 reviewed by me, shows diffuse bilateral interstitial lung markings, stable 8 mm lateral right upper lobe focal opacity and some linear scar, small left pleural effusion, large hiatal hernia.  CT chest 07/02/2019 reviewed by me shows no PE, severe bronchiectatic airways with fluid-filled airways, mucus versus aspiration with associated architectural distortion, large hiatal hernia  Review of Systems As above      Objective:   Physical Exam  Vitals:   08/11/19 1048  BP: 128/68  Pulse: 84  Temp: 97.9 F (36.6 C)  TempSrc: Oral  SpO2: 95%  Weight: 122 lb 9.6 oz (55.6 kg)  Height: 5' 3.5" (1.613 m)    GEN: A/Ox3, a bit  anxious, NAD, elderly and thin.    HEENT:  OP clear  NECK: no stridor  RESP distant, B insp rhonchi, crackles, no wheezing  CARD:  Regular, no M  Musco: some chronic venous stasis, no edema.   Neuro: awake, poor hearing, oriented       Assessment & Plan:  BRONCHIECTASIS Plan to continue same regimen >> rotating abx, chest vest, nebulized albuterol, pred 10mg  daily, GERD rx. Follow in 2 months.   , MD, PhD 08/11/2019, 11:38 AM [facility] [phone] or if no answer [phone]

## 2019-08-11 NOTE — Assessment & Plan Note (Signed)
Plan to continue same regimen >> rotating abx, chest vest, nebulized albuterol, pred 10mg  daily, GERD rx. Follow in 2 months.

## 2019-08-12 ENCOUNTER — Non-Acute Institutional Stay: Payer: Medicare Other

## 2019-08-12 DIAGNOSIS — Z515 Encounter for palliative care: Secondary | ICD-10-CM

## 2019-08-12 DIAGNOSIS — R2681 Unsteadiness on feet: Secondary | ICD-10-CM | POA: Diagnosis not present

## 2019-08-12 DIAGNOSIS — J441 Chronic obstructive pulmonary disease with (acute) exacerbation: Secondary | ICD-10-CM | POA: Diagnosis not present

## 2019-08-12 DIAGNOSIS — M6281 Muscle weakness (generalized): Secondary | ICD-10-CM | POA: Diagnosis not present

## 2019-08-12 DIAGNOSIS — R262 Difficulty in walking, not elsewhere classified: Secondary | ICD-10-CM | POA: Diagnosis not present

## 2019-08-14 NOTE — Progress Notes (Signed)
COMMUNITY PALLIATIVE CARE SW NOTE  PATIENT NAME: Sarah [name] DOB: Mar 29, 1921 MRN: [id number]  PRIMARY CARE PROVIDER: [name], MD  RESPONSIBLE PARTY:  Acct ID - Guarantor Home Phone Work Phone Relationship Acct Type  [id number] - Koenen,[name] [phone]  Self P/F     [address], [place], [place] [address]     PLAN OF CARE and INTERVENTIONS:             1. GOALS OF CARE/ ADVANCE CARE PLANNING:  Goal is for patient to remain in her apartment ([place]) and as independent as possible. Patient is a DNR. 2. SOCIAL/EMOTIONAL/SPIRITUAL ASSESSMENT/ INTERVENTIONS:  SW completed face-to-face visit with patient at the facility ([place]).Patient was preparing lunch. She stopped and spent some time talking with SW. She denied pain, but stated that she has had some swelling in her legs for a few days that caused some discomfort. Otherwise she was doing well. Patient seemed to be doing life review as she stated that she has had some thoughts and feelings about things that took place early in her life. Some of the thoughts she shared as she tried to reconcile the feelings around those thought. She stated several times " I'm just trying to be a good person". Patient shared some regrets, but feels she has had a good life. She spoke of her children as her great accomplishment. Patient was very engaged. SW provided active and reflective listening; supportive presence, processed thoughts and feelings through life review, assessed needs and comfort. SW to continue to make minimally monthly visits and PRN to assess psychosocial needs and provide support.   PATIENT/CAREGIVER EDUCATION/ COPING:  Patient expressed no coping issues or distress. She has a strong supportive network with friends and family 3. PERSONAL EMERGENCY PLAN:  Per facility protocol. Patient can access her daughters for support and palliative care team.  4. COMMUNITY RESOURCES COORDINATION/ HEALTH CARE NAVIGATION:  Patient has  assistance through [facility] nursing staff to assist with personal care needs and medication reminder. 5. FINANCIAL/LEGAL CONCERNS/INTERVENTIONS:  No financial or legal issues noted.      SOCIAL HX:  Social History   Tobacco Use  . Smoking status: Never Smoker  . Smokeless tobacco: Never Used  Substance Use Topics  . Alcohol use: Yes    Alcohol/week: 2.0 standard drinks    Types: 2 Glasses of wine per week    Comment: wine 1-2 week    CODE STATUS: DNR ADVANCED DIRECTIVES: No MOST FORM COMPLETE:  No HOSPICE EDUCATION PROVIDED: No  PPS: Patient is alert and oriented x3, but admits that she is forgetful. She ambulates independently in her apartment, but requires standby assistance with ADL's  Duration of visit and documentation: 60 minutes   Clydia Llano, LCSW

## 2019-08-16 DIAGNOSIS — Z1159 Encounter for screening for other viral diseases: Secondary | ICD-10-CM | POA: Diagnosis not present

## 2019-08-16 DIAGNOSIS — J441 Chronic obstructive pulmonary disease with (acute) exacerbation: Secondary | ICD-10-CM | POA: Diagnosis not present

## 2019-08-16 DIAGNOSIS — R2681 Unsteadiness on feet: Secondary | ICD-10-CM | POA: Diagnosis not present

## 2019-08-16 DIAGNOSIS — Z20828 Contact with and (suspected) exposure to other viral communicable diseases: Secondary | ICD-10-CM | POA: Diagnosis not present

## 2019-08-16 DIAGNOSIS — R262 Difficulty in walking, not elsewhere classified: Secondary | ICD-10-CM | POA: Diagnosis not present

## 2019-08-16 DIAGNOSIS — M6281 Muscle weakness (generalized): Secondary | ICD-10-CM | POA: Diagnosis not present

## 2019-08-23 DIAGNOSIS — Z20828 Contact with and (suspected) exposure to other viral communicable diseases: Secondary | ICD-10-CM | POA: Diagnosis not present

## 2019-08-23 DIAGNOSIS — Z1159 Encounter for screening for other viral diseases: Secondary | ICD-10-CM | POA: Diagnosis not present

## 2019-08-24 DIAGNOSIS — M6281 Muscle weakness (generalized): Secondary | ICD-10-CM | POA: Diagnosis not present

## 2019-08-24 DIAGNOSIS — R262 Difficulty in walking, not elsewhere classified: Secondary | ICD-10-CM | POA: Diagnosis not present

## 2019-08-24 DIAGNOSIS — R2681 Unsteadiness on feet: Secondary | ICD-10-CM | POA: Diagnosis not present

## 2019-08-24 DIAGNOSIS — J441 Chronic obstructive pulmonary disease with (acute) exacerbation: Secondary | ICD-10-CM | POA: Diagnosis not present

## 2019-08-26 DIAGNOSIS — R2681 Unsteadiness on feet: Secondary | ICD-10-CM | POA: Diagnosis not present

## 2019-08-26 DIAGNOSIS — M6281 Muscle weakness (generalized): Secondary | ICD-10-CM | POA: Diagnosis not present

## 2019-08-26 DIAGNOSIS — J441 Chronic obstructive pulmonary disease with (acute) exacerbation: Secondary | ICD-10-CM | POA: Diagnosis not present

## 2019-08-26 DIAGNOSIS — R262 Difficulty in walking, not elsewhere classified: Secondary | ICD-10-CM | POA: Diagnosis not present

## 2019-08-30 DIAGNOSIS — Z20828 Contact with and (suspected) exposure to other viral communicable diseases: Secondary | ICD-10-CM | POA: Diagnosis not present

## 2019-08-30 DIAGNOSIS — Z1159 Encounter for screening for other viral diseases: Secondary | ICD-10-CM | POA: Diagnosis not present

## 2019-08-31 DIAGNOSIS — M6281 Muscle weakness (generalized): Secondary | ICD-10-CM | POA: Diagnosis not present

## 2019-08-31 DIAGNOSIS — J441 Chronic obstructive pulmonary disease with (acute) exacerbation: Secondary | ICD-10-CM | POA: Diagnosis not present

## 2019-08-31 DIAGNOSIS — R262 Difficulty in walking, not elsewhere classified: Secondary | ICD-10-CM | POA: Diagnosis not present

## 2019-08-31 DIAGNOSIS — R2681 Unsteadiness on feet: Secondary | ICD-10-CM | POA: Diagnosis not present

## 2019-09-02 DIAGNOSIS — J441 Chronic obstructive pulmonary disease with (acute) exacerbation: Secondary | ICD-10-CM | POA: Diagnosis not present

## 2019-09-02 DIAGNOSIS — M6281 Muscle weakness (generalized): Secondary | ICD-10-CM | POA: Diagnosis not present

## 2019-09-02 DIAGNOSIS — R262 Difficulty in walking, not elsewhere classified: Secondary | ICD-10-CM | POA: Diagnosis not present

## 2019-09-02 DIAGNOSIS — R2681 Unsteadiness on feet: Secondary | ICD-10-CM | POA: Diagnosis not present

## 2019-09-06 DIAGNOSIS — Z20828 Contact with and (suspected) exposure to other viral communicable diseases: Secondary | ICD-10-CM | POA: Diagnosis not present

## 2019-09-06 DIAGNOSIS — M6281 Muscle weakness (generalized): Secondary | ICD-10-CM | POA: Diagnosis not present

## 2019-09-06 DIAGNOSIS — Z1159 Encounter for screening for other viral diseases: Secondary | ICD-10-CM | POA: Diagnosis not present

## 2019-09-06 DIAGNOSIS — J441 Chronic obstructive pulmonary disease with (acute) exacerbation: Secondary | ICD-10-CM | POA: Diagnosis not present

## 2019-09-06 DIAGNOSIS — R262 Difficulty in walking, not elsewhere classified: Secondary | ICD-10-CM | POA: Diagnosis not present

## 2019-09-06 DIAGNOSIS — R2681 Unsteadiness on feet: Secondary | ICD-10-CM | POA: Diagnosis not present

## 2019-09-09 ENCOUNTER — Non-Acute Institutional Stay: Payer: Medicare Other

## 2019-09-09 ENCOUNTER — Other Ambulatory Visit: Payer: Self-pay

## 2019-09-09 DIAGNOSIS — Z515 Encounter for palliative care: Secondary | ICD-10-CM

## 2019-09-12 NOTE — Progress Notes (Signed)
COMMUNITY PALLIATIVE CARE SW NOTE  PATIENT NAME: Sarah [name] DOB: 1921/11/25 MRN: [id number]  PRIMARY CARE PROVIDER: [name], MD  RESPONSIBLE PARTY:  Acct ID - Guarantor Home Phone Work Phone Relationship Acct Type  [id number] - [name],[name] [phone]  Self P/F     [address], [place], [place] [address]     PLAN OF CARE and INTERVENTIONS:             1. GOALS OF CARE/ ADVANCE CARE PLANNING: Goal is for patient to remain in her apartment ([place]) and as independent as possible. Patient is a DNR.  2. SOCIAL/EMOTIONAL/SPIRITUAL ASSESSMENT/ INTERVENTIONS:  patientat the facility ([place]).Patient was getting dressed. After getting dressed she came out and talked  some time talking with SW. She denied pain, but stated that she has some soreness to her wrist and arm from a fall off the toilet. Patient states that she has some soreness and numbness to her thump . She takes extra strength Tylenol for the soreness and discomfort and that seems to help. Otherwise she was doing well. Patient engaged in  life review as she stated "I've lost something". She reflected on the medical issues she has had over the past few years and how she overcame them. Patient also reflected on her life, her husband and children, noting she has had good her life and is overall happy. No weight loss noted. Patient report one instance where she has had a blood pressure spike. She has finished PT. Patient was very engaged. SW provided active and reflective listening; supportive presence, processed thoughts and feelings through life review, assessed needs and comfort. SW to continue to make minimally monthly visits and PRN to assess psychosocial needs and provide support. SW will provide ongoing psychosocial support and counseling.  3. PATIENT/CAREGIVER EDUCATION/ COPING:  Patient expressed no coping issues or distress. She has a strong supportive network with friends and family 4. PERSONAL EMERGENCY PLAN:   Per facility protocol 5. COMMUNITY RESOURCES COORDINATION/ HEALTH CARE NAVIGATION:  Patient has assistance through [facility] nursing staff to assist with personal care needs and medication reminder. 6. FINANCIAL/LEGAL CONCERNS/INTERVENTIONS:   No financial or legal issues noted.      SOCIAL HX:  Social History   Tobacco Use  . Smoking status: Never Smoker  . Smokeless tobacco: Never Used  Substance Use Topics  . Alcohol use: Yes    Alcohol/week: 2.0 standard drinks    Types: 2 Glasses of wine per week    Comment: wine 1-2 week    CODE STATUS: DNR ADVANCED DIRECTIVES: No MOST FORM COMPLETE:  No HOSPICE EDUCATION PROVIDED: No  PPS: Patient is alert and oriented x3, but admits that she is forgetful. She ambulates independently in her apartment, but has assistance for personal care needs and medication reminders.  Duration of visit and documentation: 45 minutes    Sarah Llano, LCSW

## 2019-09-20 DIAGNOSIS — Z1159 Encounter for screening for other viral diseases: Secondary | ICD-10-CM | POA: Diagnosis not present

## 2019-09-20 DIAGNOSIS — Z20828 Contact with and (suspected) exposure to other viral communicable diseases: Secondary | ICD-10-CM | POA: Diagnosis not present

## 2019-09-24 ENCOUNTER — Telehealth: Payer: Self-pay | Admitting: Emergency Medicine

## 2019-09-24 NOTE — Telephone Encounter (Signed)
Primary Pulmonologist: Dr.Byrum Last office visit and with whom:08/11/19 Dr.Byrum What do we see them for (pulmonary problems): Bronchiectasis Last OV assessment/plan: BRONCHIECTASIS Plan to continue same regimen >> rotating abx, chest vest, nebulized albuterol, pred 10mg  daily, GERD rx. Follow in 2 months.   Was appointment offered to patient (explain)?     Reason for call: Spoke with patient's daughter ) regarding patient. Sarah [name] stated that patient has a sore throat and patient stated its hard for her to swallow all these symptoms started today. Have a lot of mucus when coughing up sputum Dark brown color. No fever. O2 was between 87-88 patient has been coughing.  Dr.Byrum can you please advise.  Thank you   Allergies  Allergen Reactions   Codeine Anaphylaxis   Hydrocodone Nausea And Vomiting and Other (See Comments)    SYNCOPE AND BRADYCARDIA   Isosorbide Nitrate Other (See Comments)    BRADYCARDIA   Oxycodone Palpitations    Rapid heart beat   Clarithromycin Nausea And Vomiting    Has patient had a PCN reaction causing immediate rash, facial/tongue/throat swelling, SOB or lightheadedness with hypotension: Unknown Has patient had a PCN reaction causing severe rash involving mucus membranes or skin necrosis: Unknown Has patient had a PCN reaction that required hospitalization: Unknown Has patient had a PCN reaction occurring within the last 10 years: Unknown If all of the above answers are "NO", then may proceed with Cephalosporin use.    Fiorinal [Butalbital-Aspirin-Caffeine] Swelling and Other (See Comments)    Facial swelling    Levofloxacin Nausea And Vomiting and Other (See Comments)    SYNCOPE ALSO   Augmentin [Amoxicillin-Pot Clavulanate] Diarrhea   Doxycycline     Sweats and aches    Immunization History  Administered Date(s) Administered   Fluad Quad(high Dose 65+) 10/08/2018   Influenza Split 10/15/2011, 10/14/2012   Influenza Whole  10/17/2008, 11/14/2009, 11/13/2010   Influenza, High Dose Seasonal PF 11/21/2015, 10/29/2016, 09/22/2017   Influenza,inj,Quad PF,6+ Mos 09/17/2013, 10/07/2014   Moderna SARS-COVID-2 Vaccination 02/04/2019, 03/04/2019   Pneumococcal Polysaccharide-23 02/19/1999

## 2019-09-24 NOTE — Telephone Encounter (Signed)
Please ask them to start their next scheduled antibiotic early (now) if they haven't already done so. If she has, then they may need to seek urgent care or ED care if she is not responding.

## 2019-09-25 NOTE — Telephone Encounter (Signed)
Called and spoke with Sarah [name] letting her know the info stated by RB and she verbalized understanding stating that pt has not started abx yet but she will have her begin it today. Nothing further needed.

## 2019-09-27 ENCOUNTER — Encounter: Payer: Self-pay | Admitting: Physician Assistant

## 2019-09-27 ENCOUNTER — Ambulatory Visit (INDEPENDENT_AMBULATORY_CARE_PROVIDER_SITE_OTHER): Payer: Medicare Other | Admitting: Physician Assistant

## 2019-09-27 ENCOUNTER — Other Ambulatory Visit: Payer: Self-pay

## 2019-09-27 DIAGNOSIS — L570 Actinic keratosis: Secondary | ICD-10-CM | POA: Diagnosis not present

## 2019-09-27 DIAGNOSIS — L820 Inflamed seborrheic keratosis: Secondary | ICD-10-CM | POA: Diagnosis not present

## 2019-09-27 NOTE — Progress Notes (Signed)
   New Patient Visit  Subjective  Sarah [name] is a 86 y.o. female who presents for the following: Skin Problem (Scalp is breaking out. It comes on forehead. It does itch. Has some scale on it. Bump like spots.  She said her hair follicles have stuff that comes out. Also she thinks her neck area looks darker. ).Patient has seen [facility] in the past but it has been over 3 years.  Objective  Well appearing patient in no apparent distress; mood and affect are within normal limits.  A focused examination was performed including face, neck and right shoulder. Relevant physical exam findings are noted in the Assessment and Plan.  Objective  Left Temporal Scalp (2), Right Temple: Erythematous patches with gritty scale.  Objective  Posterior Mid Neck: Erythematous stuck-on, waxy papule or plaque.   Assessment & Plan  AK (actinic keratosis) (3) Right Temple; Left Temporal Scalp (2)  Destruction of lesion - Left Temporal Scalp, Right Temple Complexity: simple   Destruction method: cryotherapy   Informed consent: discussed and consent obtained   Timeout:  patient name, date of birth, surgical site, and procedure verified Lesion destroyed using liquid nitrogen: Yes   Outcome: patient tolerated procedure well with no complications    Inflamed seborrheic keratosis Posterior Mid Neck  Destruction of lesion - Posterior Mid Neck Complexity: simple   Destruction method: cryotherapy   Informed consent: discussed and consent obtained   Timeout:  patient name, date of birth, surgical site, and procedure verified Lesion destroyed using liquid nitrogen: Yes   Outcome: patient tolerated procedure well with no complications    During the visit the patient was constantly running her fingers over her face and scratching her face to feel the texture of her skin that she is concerned with. The texture of her skin appears normal for her age.  She had some facial hair and neck hair growth that may be  causing the texture to seem different to her. This may be secondary to the long term prednisone use. Her neck was not hyperpigmented.  I froze a couple keratoses from her forehead that were rough bumps that she was feeling. We discussed the possibility of adding an otc retinol cream but to watch for irritation. Her scalp appeared normal but  we discussed the option for a treatment for this but she did not seem interested at this time. I encouraged her to stop scratching at her face and to keep her hands busy with something else.

## 2019-10-04 DIAGNOSIS — H401122 Primary open-angle glaucoma, left eye, moderate stage: Secondary | ICD-10-CM | POA: Diagnosis not present

## 2019-10-04 DIAGNOSIS — Z20828 Contact with and (suspected) exposure to other viral communicable diseases: Secondary | ICD-10-CM | POA: Diagnosis not present

## 2019-10-04 DIAGNOSIS — Z1159 Encounter for screening for other viral diseases: Secondary | ICD-10-CM | POA: Diagnosis not present

## 2019-10-04 DIAGNOSIS — H353122 Nonexudative age-related macular degeneration, left eye, intermediate dry stage: Secondary | ICD-10-CM | POA: Diagnosis not present

## 2019-10-04 DIAGNOSIS — H353210 Exudative age-related macular degeneration, right eye, stage unspecified: Secondary | ICD-10-CM | POA: Diagnosis not present

## 2019-10-06 ENCOUNTER — Ambulatory Visit: Payer: Medicare Other | Admitting: Podiatry

## 2019-10-11 ENCOUNTER — Other Ambulatory Visit: Payer: Self-pay | Admitting: Emergency Medicine

## 2019-10-11 ENCOUNTER — Encounter: Payer: Self-pay | Admitting: Emergency Medicine

## 2019-10-11 ENCOUNTER — Ambulatory Visit (INDEPENDENT_AMBULATORY_CARE_PROVIDER_SITE_OTHER): Payer: Medicare Other | Admitting: Emergency Medicine

## 2019-10-11 ENCOUNTER — Other Ambulatory Visit: Payer: Self-pay

## 2019-10-11 VITALS — BP 134/73 | HR 102 | Temp 97.2°F | Ht 64.0 in | Wt 122.2 lb

## 2019-10-11 DIAGNOSIS — Z23 Encounter for immunization: Secondary | ICD-10-CM

## 2019-10-11 DIAGNOSIS — J479 Bronchiectasis, uncomplicated: Secondary | ICD-10-CM

## 2019-10-11 DIAGNOSIS — Z20828 Contact with and (suspected) exposure to other viral communicable diseases: Secondary | ICD-10-CM | POA: Diagnosis not present

## 2019-10-11 DIAGNOSIS — Z1159 Encounter for screening for other viral diseases: Secondary | ICD-10-CM | POA: Diagnosis not present

## 2019-10-11 NOTE — Assessment & Plan Note (Signed)
Improved after she began to have some more active symptoms, more cough, purulent sputum, possibly also some mild hemoptysis at the beginning of this month.  She took her antibiotic early and has improved.  Plan to continue her same regimen, follow her closely  We will continue to rotate your same antibiotics monthly. Continue prednisone 10 mg daily Continue your chest vest at least twice a day Continue albuterol nebulizer treatments on the same schedule Call our office if you have changes in the amount of sputum, change in the color of sputum Follow with Dr Delton Coombes in 3 months or sooner if you have any problems.

## 2019-10-11 NOTE — Patient Instructions (Addendum)
We will continue to rotate your same antibiotics monthly. Continue prednisone 10 mg daily Continue your chest vest at least twice a day Continue albuterol nebulizer treatments on the same schedule Call our office if you have changes in the amount of sputum, change in the color of sputum Follow with Dr Delton Coombes in 3 months or sooner if you have any problems.

## 2019-10-11 NOTE — Progress Notes (Signed)
   Subjective:    Patient ID: Sarah [name], female    DOB: 09/25/1921, 86 y.o.   MRN: [id number]  HPI  ROV 08/11/19 --pleasant 86 year old woman with significant bronchiectasis, pseudomonal colonization, chronic asthmatic bronchitis with upper airway irritation syndrome.  She was last seen here 7/9 following a hospitalization 07/02/2019 for exacerbation of her bronchiectasis and possible pneumonia. Currently maintained on prednisone 10 mg daily, rotating antibiotics (Zithromax, Omnicef), smart vest, scheduled albuterol nebs, Protonix.  Currently on omnicef. She is feeling a bit weak, more tired lately.  She is coughing up clear mucous currently.   Chest x-ray 07/31/2019 reviewed by me, shows diffuse bilateral interstitial lung markings, stable 8 mm lateral right upper lobe focal opacity and some linear scar, small left pleural effusion, large hiatal hernia.  CT chest 07/02/2019 reviewed by me shows no PE, severe bronchiectatic airways with fluid-filled airways, mucus versus aspiration with associated architectural distortion, large hiatal hernia  ROV 10/11/19 --follow-up visit for 86 year old woman with bronchiectasis, chronic cough, chronic asthmatic bronchitis.  She is colonized with Pseudomonas.  We have her on prednisone 10 mg daily, rotating antibiotics (azithromycin and Omnicef).  She uses a smart vest on schedule 2-3x a day, scheduled albuterol nebs, Protonix.  She was having increased mucus, sore throat and drainage beginning 9/10 so I asked her to start her next scheduled antibiotic early.  She reports that she benefited from the early abx - got back to baseline. She is coughing up purulent mucous, about after her albuterol. She is able to exert herself some.    Review of Systems As above      Objective:   Physical Exam  Vitals:   10/11/19 1336  BP: 134/73  Pulse: (!) 102  Temp: (!) 97.2 F (36.2 C)  TempSrc: Temporal  SpO2: 95%  Weight: 122 lb 3.2 oz (55.4 kg)  Height: 5'  4" (1.626 m)    GEN: A/Ox3, a bit anxious, NAD, elderly and thin.    HEENT:  OP clear  NECK: no stridor  RESP distant, B insp rhonchi, crackles, no wheezing  CARD:  Regular, no M  Musco: some chronic venous stasis, no edema.   Neuro: awake, poor hearing, oriented       Assessment & Plan:  BRONCHIECTASIS Improved after she began to have some more active symptoms, more cough, purulent sputum, possibly also some mild hemoptysis at the beginning of this month.  She took her antibiotic early and has improved.  Plan to continue her same regimen, follow her closely  We will continue to rotate your same antibiotics monthly. Continue prednisone 10 mg daily Continue your chest vest at least twice a day Continue albuterol nebulizer treatments on the same schedule Call our office if you have changes in the amount of sputum, change in the color of sputum Follow with Dr Delton Coombes in 3 months or sooner if you have any problems.  Levy Pupa, MD, PhD 10/11/2019, 1:53 PM [facility] [phone] or if no answer [phone]

## 2019-10-12 ENCOUNTER — Ambulatory Visit: Payer: Medicare Other | Admitting: Podiatry

## 2019-10-14 ENCOUNTER — Other Ambulatory Visit: Payer: Self-pay

## 2019-10-14 ENCOUNTER — Other Ambulatory Visit: Payer: Medicare Other

## 2019-10-14 DIAGNOSIS — Z515 Encounter for palliative care: Secondary | ICD-10-CM

## 2019-10-14 NOTE — Progress Notes (Signed)
COMMUNITY PALLIATIVE CARE SW NOTE  PATIENT NAME: Sarah [name] DOB: Oct 19, 1921 MRN: [id number]  PRIMARY CARE PROVIDER: [name], MD  RESPONSIBLE PARTY:  Acct ID - Guarantor Home Phone Work Phone Relationship Acct Type  [id number] - Teffeteller,[name] [phone]  Self P/F     [address], [place], [place] [address]     PLAN OF CARE and INTERVENTIONS:             1. GOALS OF CARE/ ADVANCE CARE PLANNING:  Goal is for patient to remain in her apartment ([place]) and as independent as possible. Patient is a DNR. 2. SOCIAL/EMOTIONAL/SPIRITUAL ASSESSMENT/ INTERVENTIONS: SW completed a face-to-face visit with [place] at [employer] [place]. Patient was sitting in a chair in her room. She was awake and alert, but appeared anxious. She immediately showed SW her leg. It was wrapped, but she unwrapped it and showed SW. SW observed her right leg to be red with a small blister on her outer leg. Her feet were also swollen. SW added palliative care RN-Sarah [name] to the visit virtually. She discussed patient's concerns, observed her legs and encouraged her to use skin prep or petroleum jelly on it before wrapping it. Patient reported that her leg was more swollen and the blister was larger three days ago, but was better today. SW observed patient to be short of breath as she talked and some wheezing noted. SW stayed with patient until she began her breathing treatment. She sat on her couch with her legs elevated. Patient appeared to calmer and more comfortable. SW left a telephone update for her daughterJohnny [name]. SW will provided ongoing assessment of psychosocial needs and assessment of patient through regular visits.   3. PATIENT/CAREGIVER EDUCATION/ COPING: Patient expressed no coping issues or distress. She has a strong supportive network with friends and family. 4. PERSONAL EMERGENCY PLAN:  Per facility protocol.  5. COMMUNITY RESOURCES COORDINATION/ HEALTH CARE NAVIGATION: Patient has assistance through  [facility] nursing staff to assist with personal care needs and medication reminder. 6. FINANCIAL/LEGAL CONCERNS/INTERVENTIONS:  None.     SOCIAL HX:  Social History   Tobacco Use   Smoking status: Never Smoker   Smokeless tobacco: Never Used  Substance Use Topics   Alcohol use: Yes    Alcohol/week: 2.0 standard drinks    Types: 2 Glasses of wine per week    Comment: wine 1-2 week    CODE STATUS: DNR ADVANCED DIRECTIVES: Yes MOST FORM COMPLETE:  No HOSPICE EDUCATION PROVIDED: No  PPS:  Patient is alert and oriented x3, but admits that she is forgetful. She ambulates independently in her apartment, but has assistance for personal care needs and medication reminders.  Duration of visit and documentation: 60 minutes      Sarah Llano, LCSW

## 2019-10-18 DIAGNOSIS — F339 Major depressive disorder, recurrent, unspecified: Secondary | ICD-10-CM | POA: Diagnosis not present

## 2019-10-18 DIAGNOSIS — Z20828 Contact with and (suspected) exposure to other viral communicable diseases: Secondary | ICD-10-CM | POA: Diagnosis not present

## 2019-10-18 DIAGNOSIS — D692 Other nonthrombocytopenic purpura: Secondary | ICD-10-CM | POA: Diagnosis not present

## 2019-10-18 DIAGNOSIS — T148XXA Other injury of unspecified body region, initial encounter: Secondary | ICD-10-CM | POA: Diagnosis not present

## 2019-10-18 DIAGNOSIS — Z1159 Encounter for screening for other viral diseases: Secondary | ICD-10-CM | POA: Diagnosis not present

## 2019-10-18 DIAGNOSIS — I10 Essential (primary) hypertension: Secondary | ICD-10-CM | POA: Diagnosis not present

## 2019-10-18 DIAGNOSIS — F410 Panic disorder [episodic paroxysmal anxiety] without agoraphobia: Secondary | ICD-10-CM | POA: Diagnosis not present

## 2019-10-25 DIAGNOSIS — Z20828 Contact with and (suspected) exposure to other viral communicable diseases: Secondary | ICD-10-CM | POA: Diagnosis not present

## 2019-10-25 DIAGNOSIS — Z1159 Encounter for screening for other viral diseases: Secondary | ICD-10-CM | POA: Diagnosis not present

## 2019-10-27 DIAGNOSIS — H903 Sensorineural hearing loss, bilateral: Secondary | ICD-10-CM | POA: Diagnosis not present

## 2019-11-01 DIAGNOSIS — Z1159 Encounter for screening for other viral diseases: Secondary | ICD-10-CM | POA: Diagnosis not present

## 2019-11-01 DIAGNOSIS — Z20828 Contact with and (suspected) exposure to other viral communicable diseases: Secondary | ICD-10-CM | POA: Diagnosis not present

## 2019-11-02 ENCOUNTER — Other Ambulatory Visit: Payer: Medicare Other

## 2019-11-02 ENCOUNTER — Other Ambulatory Visit: Payer: Self-pay

## 2019-11-02 DIAGNOSIS — Z515 Encounter for palliative care: Secondary | ICD-10-CM

## 2019-11-04 NOTE — Progress Notes (Signed)
COMMUNITY PALLIATIVE CARE SW NOTE  PATIENT NAME: Sarah [name] DOB: Apr 15, 1921 MRN: [id number]  PRIMARY CARE PROVIDER: [name], MD  RESPONSIBLE PARTY:  Acct ID - Guarantor Home Phone Work Phone Relationship Acct Type  [id number] - Sarah [name],Sarah [name]  Self P/F     Sarah [name], Sarah [name], Sarah [name] [id number]     PLAN OF CARE and INTERVENTIONS:             1. GOALS OF CARE/ ADVANCE CARE PLANNING:  Goal is for patient to remain in her independent apartment. Patient is a DNR and form is present in the apartment.  2. SOCIAL/EMOTIONAL/SPIRITUAL ASSESSMENT/ INTERVENTIONS:  SW completed a face-to-face visit with patient at her apartment. She was getting dressed. Patient reported that she had slept in this morning . She reported that she was feeling sleepy and tired this morning. Patient yawned throughout the visit. Patient showed SW her leg where it appeared to be healing. She stated that she continue to wrap it. She report that her appetite remains fair. She is stated that she has had difficulty with her hired care agency as their visits have not been consistent. She advised that her daughters are in contact with the agency to ensure her needs are being met. Patient engaged in life review as she reflected on her overall medical condition and her desire to not be a burden to her family. She reflected on caring for her husband and the success of her children and grandchildren. She remains content being in her apartment, occasionally going out with her daughters. Patient continues to have shortness of breath with talking and any activity. Patient has some wheezing. She continues with regular breathing treatments and rest which she feels is helpful. SW provided supportive presence, active listening, engaged patient is life review, supportive counseling and normalized her feelings. SW will provide ongoing assessment of psychosocial needs and comfort of patient through regular visits.   3. PATIENT/CAREGIVER EDUCATION/ COPING:  Patient appears to be coping well. Her family remains very supportive and active in her care.  4. PERSONAL EMERGENCY PLAN:  Per facility protocol. 5. COMMUNITY RESOURCES COORDINATION/ HEALTH CARE NAVIGATION: Patient has daily personal care assistance through [facility]. 6. FINANCIAL/LEGAL CONCERNS/INTERVENTIONS:  None.     SOCIAL HX:  Social History   Tobacco Use  . Smoking status: Never Smoker  . Smokeless tobacco: Never Used  Substance Use Topics  . Alcohol use: Yes    Alcohol/week: 2.0 standard drinks    Types: 2 Glasses of wine per week    Comment: wine 1-2 week    CODE STATUS; DNR ADVANCED DIRECTIVES: Yes MOST FORM COMPLETE:  No HOSPICE EDUCATION PROVIDED: No  PPS: Patient is alert and oriented x3, but admits that she is forgetful. She ambulates independently in her apartment, buthasassistance for personal care needs and medication reminders.  Duration of visit and documentation: 60 minutes       Sarah Puller, LCSW

## 2019-11-08 DIAGNOSIS — Z1159 Encounter for screening for other viral diseases: Secondary | ICD-10-CM | POA: Diagnosis not present

## 2019-11-08 DIAGNOSIS — Z20828 Contact with and (suspected) exposure to other viral communicable diseases: Secondary | ICD-10-CM | POA: Diagnosis not present

## 2019-11-22 DIAGNOSIS — Z20828 Contact with and (suspected) exposure to other viral communicable diseases: Secondary | ICD-10-CM | POA: Diagnosis not present

## 2019-11-22 DIAGNOSIS — Z1159 Encounter for screening for other viral diseases: Secondary | ICD-10-CM | POA: Diagnosis not present

## 2019-11-24 ENCOUNTER — Other Ambulatory Visit: Payer: Self-pay | Admitting: Emergency Medicine

## 2019-11-29 DIAGNOSIS — Z23 Encounter for immunization: Secondary | ICD-10-CM | POA: Diagnosis not present

## 2019-11-29 DIAGNOSIS — Z20828 Contact with and (suspected) exposure to other viral communicable diseases: Secondary | ICD-10-CM | POA: Diagnosis not present

## 2019-11-29 DIAGNOSIS — Z1159 Encounter for screening for other viral diseases: Secondary | ICD-10-CM | POA: Diagnosis not present

## 2019-12-06 ENCOUNTER — Other Ambulatory Visit: Payer: Medicare Other

## 2019-12-06 ENCOUNTER — Other Ambulatory Visit: Payer: Self-pay

## 2019-12-06 DIAGNOSIS — Z515 Encounter for palliative care: Secondary | ICD-10-CM

## 2019-12-06 DIAGNOSIS — Z20828 Contact with and (suspected) exposure to other viral communicable diseases: Secondary | ICD-10-CM | POA: Diagnosis not present

## 2019-12-06 DIAGNOSIS — Z1159 Encounter for screening for other viral diseases: Secondary | ICD-10-CM | POA: Diagnosis not present

## 2019-12-10 ENCOUNTER — Other Ambulatory Visit: Payer: Medicare Other | Admitting: Hospice

## 2019-12-10 ENCOUNTER — Telehealth: Payer: Self-pay | Admitting: *Deleted

## 2019-12-10 ENCOUNTER — Other Ambulatory Visit: Payer: Self-pay

## 2019-12-10 DIAGNOSIS — Z515 Encounter for palliative care: Secondary | ICD-10-CM | POA: Diagnosis not present

## 2019-12-10 DIAGNOSIS — R399 Unspecified symptoms and signs involving the genitourinary system: Secondary | ICD-10-CM | POA: Diagnosis not present

## 2019-12-10 DIAGNOSIS — R350 Frequency of micturition: Secondary | ICD-10-CM | POA: Diagnosis not present

## 2019-12-10 DIAGNOSIS — R300 Dysuria: Secondary | ICD-10-CM | POA: Diagnosis not present

## 2019-12-10 DIAGNOSIS — R339 Retention of urine, unspecified: Secondary | ICD-10-CM | POA: Diagnosis not present

## 2019-12-10 NOTE — Progress Notes (Signed)
[facility] Palliative Care Consult Note Telephone: [phone]  Fax: [phone]  PATIENT NAME: Sarah [name] DOB: [birth year] MRN: [id number]  PRIMARY CARE PROVIDER:   [name], MD Sarah Jordan, MD [address] [place],  [place] [address]  REFERRING PROVIDER: [name], MD Sarah Jordan, MD [address] [place],  [place] [address]  RESPONSIBLE PARTY:  Self Emergency contact: Sarah [name] [phone]  TELEHEALTH VISIT STATEMENT Due to the COVID-19 crisis, this visit was done via telephone from my office. It was initiated and consented to by this patient and/or family.     RECOMMENDATIONS/PLAN:   Telehealth visit at the request of Sarah [name] with report that patient having burning and bladder spasms when she voids.  Family requesting medication to treat; a home urine test shows positive result. Sarah [name] at home with patient during call.  Visit consisted of building trust.  Palliative Medicine as specialized medical care for people living with serious illness, aimed at facilitating better quality of life through symptoms relief, assisting with advance care plan and establishing goals of care.  Code Status: CODE STATUS reviewed.  Patient is a DO NOT RESUSCITATE  Goals of Care: Goals of care include to maximize quality of life and symptom management.   Symptom management:  Bladder spasms and burning: NP spoke with patient's daughter Sarah [name] and with patient confirming bladder spasms and burning when she urinates.  She also endorsed frequency.  No fever/chills, no change in behavior or mental status. No previous experience of UTI/treatment.  Education provided on the need for urine sample sent for culture and sensitivity to ensure proper treatment of the infection.  Options of treating patient with Pyridium and Bactrim was also discussed, explaining that if infection was not sensitive to Bactrim,  collecting urine for C&S will be ineffective since antibiotic already initiated.  Sarah [name] discussed with her sister and they decided that the best thing is to take her to [facility] for proper UA and C&S and antibiotic therapy if indicated.  She expressed appreciation for the education and guidance.  Palliative will continue to monitor for symptom management/decline and make recommendations as needed.  Family /Caregiver/Community Supports:  Patient lives in her independent apartment at about [address].  Per chart review, her daughters are in contact with her and also in contact with her care agency to ensure that her needs are being met.  Strong family support system identified. I spent 30 minutes providing this consultation; time includes time spent with patient/family, chart review,  and documentation. More than 50% of the time in this consultation was spent on visit consisted of counseling and coordinating communication  HISTORY OF PRESENT ILLNESS:  Sarah [name] is a 86 y.o. year old female with multiple medical problems including urinary symptoms.  [facility] and [facility].    CODE STATUS: DNR   HOSPICE ELIGIBILITY/DIAGNOSIS: TBD  PAST MEDICAL HISTORY:  Past Medical History:  Diagnosis Date  . Allergic rhinitis   . Anxiety    Anxiety attacks  . Arthritis    "a little; in my back and hands" (04/15/2012)  . Asthma   . Bronchiectasis    with history of Legionaires with chronic rales/rhonchi  . Chest pain at rest   . Daily headache    "over the last week" 04/15/2012   . Depression   . Diastolic dysfunction    a. per echo 08/2010 with normal LV function;  b.  06/2011 Echo: EF 65-70%  . GERD (gastroesophageal reflux disease)    "just recently" (04/15/2012)  . H/O hiatal hernia   . H/O Legionnaire's disease 11/1976  . History of blood transfusion 1972   "w/hysterectomy" (04/15/2012)  . Hypercholesteremia   . Hypertension    Negative renal duplex in  December of 2012  . Hypothyroidism   . Idiopathic peripheral neuropathy   . MAC (mycobacterium avium-intracellulare complex)   . Migraines    "outgrew them" (04/15/2012)  . NSTEMI (non-ST elevated myocardial infarction) ([facility])    a. Normal coronaries per cath August 2012 and negative CT angio for PE;  b. 06/2011 Repeat admission w/ chest pain and elevated troponin's, CTA Chest w/o PE  . Osteopenia 02/2011   t score - 2.1  . Pneumonia    "recurrent" (04/15/2012)  . Pollen allergies   . Shortness of breath    "sometimes just lying down" (04/15/2012)  . Stroke [facility]) 06/2014   cerebellum    SOCIAL HX:  Social History   Tobacco Use  . Smoking status: Never Smoker  . Smokeless tobacco: Never Used  Substance Use Topics  . Alcohol use: Yes    Alcohol/week: 2.0 standard drinks    Types: 2 Glasses of wine per week    Comment: wine 1-2 week    ALLERGIES:  Allergies  Allergen Reactions  . Codeine Anaphylaxis  . Hydrocodone Nausea And Vomiting and Other (See Comments)    SYNCOPE AND BRADYCARDIA  . Isosorbide Nitrate Other (See Comments)    BRADYCARDIA  . Oxycodone Palpitations    Rapid heart beat  . Clarithromycin Nausea And Vomiting    Has patient had a PCN reaction causing immediate rash, facial/tongue/throat swelling, SOB or lightheadedness with hypotension: Unknown Has patient had a PCN reaction causing severe rash involving mucus membranes or skin necrosis: Unknown Has patient had a PCN reaction that required hospitalization: Unknown Has patient had a PCN reaction occurring within the last 10 years: Unknown If all of the above answers are "NO", then may proceed with Cephalosporin use.   . Fiorinal [Butalbital-Aspirin-Caffeine] Swelling and Other (See Comments)    Facial swelling   . Levofloxacin Nausea And Vomiting and Other (See Comments)    SYNCOPE ALSO  . Augmentin [Amoxicillin-Pot Clavulanate] Diarrhea  . Doxycycline     Sweats and aches     PERTINENT MEDICATIONS:    Outpatient Encounter Medications as of 12/10/2019  Medication Sig  . acetaminophen (TYLENOL) 500 MG tablet Take 500 mg by mouth every 6 (six) hours as needed for moderate pain.  Marland Kitchen albuterol (PROVENTIL) (2.5 MG/3ML) 0.083% nebulizer solution Take 3 mLs (2.5 mg total) by nebulization in the morning and at bedtime. DX: J47.9  . ALPHAGAN P 0.1 % SOLN Place 1 drop into the left eye every 8 (eight) hours.   . ALPRAZolam (XANAX) 0.5 MG tablet Take 1 tablet by mouth as directed. Take 1 tablet (0.5 mg) BID at (8 am & 8 pm) & DAILY PRN FOR ANXIETY  . amLODipine (NORVASC) 10 MG tablet Take 0.5 tablets (5 mg total) by mouth daily as needed. May take an additional 0.5 mg tablet as needed for persistent elevated BP  . calcium carbonate (TUMS - DOSED IN MG ELEMENTAL CALCIUM) 500 MG chewable tablet Chew 1 tablet by mouth every 8 (eight) hours as needed for indigestion or heartburn.  . carvedilol (COREG) 6.25 MG tablet TAKE 1 TABLET(6.25 MG) BY MOUTH TWICE DAILY  . clopidogrel (PLAVIX) 75 MG tablet TAKE 1 TABLET(75 MG)  BY MOUTH DAILY  . DULoxetine (CYMBALTA) 20 MG capsule Take 20 mg by mouth 3 (three) times daily.  Marland Kitchen escitalopram (LEXAPRO) 5 MG tablet Take 5 mg by mouth daily.   . Iron-FA-B Cmp-C-Biot-Probiotic (FUSION PLUS PO) Take 1 tablet by mouth daily.  Marland Kitchen levothyroxine (SYNTHROID, LEVOTHROID) 50 MCG tablet Take 50 mcg by mouth daily.    Marland Kitchen loratadine (CLARITIN) 10 MG tablet Take 10 mg by mouth daily.  . NONFORMULARY OR COMPOUNDED ITEM [facility]:  Neuropathy Cream #11 - Bupivacaine 1%, Doxepin 3%, Gabapentin 6%, Pentoxifylline 3%, Topiramate 1%. Apply 1-2 grams to affected areas 3-4 times a day.  . pantoprazole (PROTONIX) 40 MG tablet Take 40 mg by mouth 2 (two) times daily.  . predniSONE (DELTASONE) 10 MG tablet TAKE 1 TABLET(10 MG) BY MOUTH DAILY WITH BREAKFAST  . ROCKLATAN 0.02-0.005 % SOLN Apply 1 drop to eye at bedtime.  . Sennosides (SENOKOT PO) Take 1 tablet by mouth daily as needed  (constipation).   Marland Kitchen SIMBRINZA 1-0.2 % SUSP Place 1 drop into the left eye in the morning, at noon, and at bedtime.    No facility-administered encounter medications on file as of 12/10/2019.     Teodoro Spray, NP

## 2019-12-10 NOTE — Telephone Encounter (Signed)
Received a voicemail from patient's daughter, Johnny Bridge, stating that patient has a positive UTI home test and she needed some guidance on what to do from this point.  I called and spoke with Johnny Bridge who says that patient has been c/o dysuria for the past several days so she decided to get a store bought UTI test which was positive. Patient's PCP office is closed today and daughter does not want to wait over the weekend. Patient states that she has never had a UTI in the past. I advised that I will reach out to our Palliative care NP to discuss.  I called and spoke with Palliative NP, Dierdre Harness who says she will reach out and speak with patient's daughter. I provided her with the contact information.  NP called me back and states that after the discussion she had with the daughter, they have decided to take her to Urgent care to give a urine sample in order to get it cultured.

## 2019-12-10 NOTE — Progress Notes (Signed)
COMMUNITY PALLIATIVE CARE SW NOTE  PATIENT NAME: Sarah [name] DOB: Mar 19, 1921 MRN: [id number]  PRIMARY CARE PROVIDER: [name], MD  RESPONSIBLE PARTY:  Acct ID - Guarantor Home Phone Work Phone Relationship Acct Type  [id number] - Naim,[name] [phone]  Self P/F     [address], [place], [place] [address]     PLAN OF CARE and INTERVENTIONS:             1. GOALS OF CARE/ ADVANCE CARE PLANNING: Goals is for patient to remain in her independent apartment safely. Patient is a DNR. 2. SOCIAL/EMOTIONAL/SPIRITUAL ASSESSMENT/ INTERVENTIONS:  SW arrived at patient's apartment. She was in her bedroom getting dressed. Patient appeared to be visibly short of breath and SW observed some wheezing. Patient stated that she was trying to get dressed, but stated was going slow. She states that it takes her over an hour to get dressed. She report that SOB is ongoing with any activity, but she remains independent for personal care needs with occasional assistance. Patient reported that her leg has completely healed. Patient reported no further falls or emergency room visits. She denied pain. Patient report that she continues to go down to the dinning room for meals. She is planning to go to her daughter's home for Thanksgiving and reminisced on past Thanksgiving with her family. SW provided assessment of patient's needs and comfort, supportive presence, active listening as patient engaged in life review, reassurance of ongoing support.Patient verbalized no other changes in her condition or concerns. SW will provide ongoing assessment of psychosocial needs and provide support. .   3. PATIENT/CAREGIVER EDUCATION/ COPING:  Patient is alert and oriented x3 with some forgetfulness. Her daughters and extended family members are active in her care and supportive. Patient appears to be coping well.  4. PERSONAL EMERGENCY PLAN:  Per facility protocol.  5. COMMUNITY RESOURCES COORDINATION/ HEALTH CARE NAVIGATION:   Patient receives assistance through [facility].  6. FINANCIAL/LEGAL CONCERNS/INTERVENTIONS:  None.     SOCIAL HX:  Social History   Tobacco Use  . Smoking status: Never Smoker  . Smokeless tobacco: Never Used  Substance Use Topics  . Alcohol use: Yes    Alcohol/week: 2.0 standard drinks    Types: 2 Glasses of wine per week    Comment: wine 1-2 week    CODE STATUS: DNR ADVANCED DIRECTIVES: Yes MOST FORM COMPLETE:  No HOSPICE EDUCATION PROVIDED: No  PPS: Patient is alert and oriented x3, but admits that she is forgetful. She ambulates independently in her apartment, buthasassistance for personal care needs and medication reminders.  Duration of visit and documentation: 60 minutes       Clydia Llano, LCSW

## 2019-12-11 DIAGNOSIS — R300 Dysuria: Secondary | ICD-10-CM | POA: Diagnosis not present

## 2019-12-11 DIAGNOSIS — R399 Unspecified symptoms and signs involving the genitourinary system: Secondary | ICD-10-CM | POA: Diagnosis not present

## 2019-12-13 DIAGNOSIS — Z1159 Encounter for screening for other viral diseases: Secondary | ICD-10-CM | POA: Diagnosis not present

## 2019-12-13 DIAGNOSIS — Z20828 Contact with and (suspected) exposure to other viral communicable diseases: Secondary | ICD-10-CM | POA: Diagnosis not present

## 2019-12-14 ENCOUNTER — Other Ambulatory Visit: Payer: Self-pay | Admitting: Cardiovascular Disease

## 2019-12-14 DIAGNOSIS — N3000 Acute cystitis without hematuria: Secondary | ICD-10-CM | POA: Diagnosis not present

## 2019-12-14 DIAGNOSIS — F410 Panic disorder [episodic paroxysmal anxiety] without agoraphobia: Secondary | ICD-10-CM | POA: Diagnosis not present

## 2019-12-14 DIAGNOSIS — F339 Major depressive disorder, recurrent, unspecified: Secondary | ICD-10-CM | POA: Diagnosis not present

## 2019-12-20 DIAGNOSIS — Z1159 Encounter for screening for other viral diseases: Secondary | ICD-10-CM | POA: Diagnosis not present

## 2019-12-20 DIAGNOSIS — Z20828 Contact with and (suspected) exposure to other viral communicable diseases: Secondary | ICD-10-CM | POA: Diagnosis not present

## 2019-12-23 ENCOUNTER — Other Ambulatory Visit: Payer: Self-pay | Admitting: Emergency Medicine

## 2019-12-27 ENCOUNTER — Other Ambulatory Visit: Payer: Medicare Other

## 2019-12-27 ENCOUNTER — Other Ambulatory Visit: Payer: Self-pay

## 2019-12-27 DIAGNOSIS — Z20828 Contact with and (suspected) exposure to other viral communicable diseases: Secondary | ICD-10-CM | POA: Diagnosis not present

## 2019-12-27 DIAGNOSIS — Z515 Encounter for palliative care: Secondary | ICD-10-CM

## 2019-12-27 DIAGNOSIS — Z1159 Encounter for screening for other viral diseases: Secondary | ICD-10-CM | POA: Diagnosis not present

## 2019-12-27 MED ORDER — AZITHROMYCIN 250 MG PO TABS
ORAL_TABLET | ORAL | 2 refills | Status: DC
Start: 2019-12-27 — End: 2020-05-08

## 2019-12-27 NOTE — Telephone Encounter (Signed)
Yes this needs to filled - she is on rotating abx, not sure why it expired.

## 2019-12-27 NOTE — Telephone Encounter (Signed)
RB please advise if the ZPak can be refilled for this pt.  Thank you.    Allergies  Allergen Reactions   Codeine Anaphylaxis   Hydrocodone Nausea And Vomiting and Other (See Comments)    SYNCOPE AND BRADYCARDIA   Isosorbide Nitrate Other (See Comments)    BRADYCARDIA   Oxycodone Palpitations    Rapid heart beat   Clarithromycin Nausea And Vomiting    Has patient had a PCN reaction causing immediate rash, facial/tongue/throat swelling, SOB or lightheadedness with hypotension: Unknown Has patient had a PCN reaction causing severe rash involving mucus membranes or skin necrosis: Unknown Has patient had a PCN reaction that required hospitalization: Unknown Has patient had a PCN reaction occurring within the last 10 years: Unknown If all of the above answers are "NO", then may proceed with Cephalosporin use.    Fiorinal [Butalbital-Aspirin-Caffeine] Swelling and Other (See Comments)    Facial swelling    Levofloxacin Nausea And Vomiting and Other (See Comments)    SYNCOPE ALSO   Augmentin [Amoxicillin-Pot Clavulanate] Diarrhea   Doxycycline     Sweats and aches    Current Outpatient Medications on File Prior to Visit  Medication Sig Dispense Refill   acetaminophen (TYLENOL) 500 MG tablet Take 500 mg by mouth every 6 (six) hours as needed for moderate pain.     albuterol (PROVENTIL) (2.5 MG/3ML) 0.083% nebulizer solution Take 3 mLs (2.5 mg total) by nebulization in the morning and at bedtime. DX: J47.9 720 mL 3   ALPHAGAN P 0.1 % SOLN Place 1 drop into the left eye every 8 (eight) hours.      ALPRAZolam (XANAX) 0.5 MG tablet Take 1 tablet by mouth as directed. Take 1 tablet (0.5 mg) BID at (8 am & 8 pm) & DAILY PRN FOR ANXIETY     amLODipine (NORVASC) 10 MG tablet Take 0.5 tablets (5 mg total) by mouth daily as needed. May take an additional 0.5 mg tablet as needed for persistent elevated BP 45 tablet 3   calcium carbonate (TUMS - DOSED IN MG ELEMENTAL CALCIUM) 500 MG  chewable tablet Chew 1 tablet by mouth every 8 (eight) hours as needed for indigestion or heartburn.     carvedilol (COREG) 6.25 MG tablet TAKE 1 TABLET(6.25 MG) BY MOUTH TWICE DAILY 180 tablet 2   clopidogrel (PLAVIX) 75 MG tablet TAKE 1 TABLET(75 MG) BY MOUTH DAILY 30 tablet 7   DULoxetine (CYMBALTA) 20 MG capsule Take 20 mg by mouth 3 (three) times daily.     escitalopram (LEXAPRO) 5 MG tablet Take 5 mg by mouth daily.      Iron-FA-B Cmp-C-Biot-Probiotic (FUSION PLUS PO) Take 1 tablet by mouth daily.     levothyroxine (SYNTHROID, LEVOTHROID) 50 MCG tablet Take 50 mcg by mouth daily.       loratadine (CLARITIN) 10 MG tablet Take 10 mg by mouth daily.     NONFORMULARY OR COMPOUNDED ITEM [facility]:  Neuropathy Cream #11 - Bupivacaine 1%, Doxepin 3%, Gabapentin 6%, Pentoxifylline 3%, Topiramate 1%. Apply 1-2 grams to affected areas 3-4 times a day. 100 each 5   pantoprazole (PROTONIX) 40 MG tablet Take 40 mg by mouth 2 (two) times daily.     predniSONE (DELTASONE) 10 MG tablet TAKE 1 TABLET(10 MG) BY MOUTH DAILY WITH BREAKFAST 30 tablet 5   ROCKLATAN 0.02-0.005 % SOLN Apply 1 drop to eye at bedtime.     Sennosides (SENOKOT PO) Take 1 tablet by mouth daily as needed (constipation).  SIMBRINZA 1-0.2 % SUSP Place 1 drop into the left eye in the morning, at noon, and at bedtime.      No current facility-administered medications on file prior to visit.

## 2019-12-27 NOTE — Telephone Encounter (Signed)
Looks like this was Discontinued by: Osvaldo Shipper, MD on 07/09/2019 12:55   RX has been sent into pharmacy. Nothing further needed at this time.

## 2019-12-28 NOTE — Progress Notes (Signed)
COMMUNITY PALLIATIVE CARE SW NOTE  PATIENT NAME: Sarah [name] DOB: [birth year] MRN: [id number]  PRIMARY CARE PROVIDER: [name], MD  RESPONSIBLE PARTY:  Acct ID - Guarantor Home Phone Work Phone Relationship Acct Type  [id number] - Hudgins,[name] [phone]  Self P/F     [address], [place], [place] [address]     PLAN OF CARE and INTERVENTIONS:             1. GOALS OF CARE/ ADVANCE CARE PLANNING:  Goals is for patient to remain in her independent apartment safely. Patient is a DNR. 1. SOCIAL/EMOTIONAL/SPIRITUAL ASSESSMENT/ INTERVENTIONS:  SW completed a visit with patient at the facility. Her daughter-Mary was present. Patient was returning from lunch on her rolator. She denied pain. Patient appeared to be in good spirits. SW visit was shortened as to allow patient's daughter time to visit with patent. SW assessed Mary's needs/concerns. Corrie Dandy stated that she had no concerns. She felt her mother is doing as best she can. Corrie Dandy stated that she was there to fill patient's pill box, which she does weekly. SW reinforced of the team visit frequency and update her on previous visits. Mary verbalized appreciation for the SW visits and support.  Patient verbalized no other concerns. SW ill provide ongoing assessment of psychosocial needs and provide support.  2. PATIENT/CAREGIVER EDUCATION/ COPING:  Patient is alert and oriented x3 with some forgetfulness. Her daughters and extended family members are active in her care and supportive. Patient appears to be coping well.  2. PERSONAL EMERGENCY PLAN:  Per facility protocol. 3. COMMUNITY RESOURCES COORDINATION/ HEALTH CARE NAVIGATION:  Patient receiving personal care assistance through [facility].  4. FINANCIAL/LEGAL CONCERNS/INTERVENTIONS:  None.     SOCIAL HX:  Social History   Tobacco Use  . Smoking status: Never Smoker  . Smokeless tobacco: Never Used  Substance Use Topics  . Alcohol use: Yes    Alcohol/week: 2.0 standard drinks     Types: 2 Glasses of wine per week    Comment: wine 1-2 week    CODE STATUS: DNR  ADVANCED DIRECTIVES: Yes MOST FORM COMPLETE:  No HOSPICE EDUCATION PROVIDED: No  PPS: Patient is alert and oriented x3, but admits that she is forgetful. She ambulates independently in her apartment, buthasassistance for personal care needs and medication reminders.  Duration of visit and documentation:60minutes       [employer], LCSW

## 2020-01-05 ENCOUNTER — Other Ambulatory Visit: Payer: Self-pay

## 2020-01-05 ENCOUNTER — Ambulatory Visit (INDEPENDENT_AMBULATORY_CARE_PROVIDER_SITE_OTHER): Payer: Medicare Other | Admitting: Podiatry

## 2020-01-05 ENCOUNTER — Encounter: Payer: Self-pay | Admitting: Podiatry

## 2020-01-05 DIAGNOSIS — D692 Other nonthrombocytopenic purpura: Secondary | ICD-10-CM | POA: Insufficient documentation

## 2020-01-05 DIAGNOSIS — S91115A Laceration without foreign body of left lesser toe(s) without damage to nail, initial encounter: Secondary | ICD-10-CM

## 2020-01-05 DIAGNOSIS — E559 Vitamin D deficiency, unspecified: Secondary | ICD-10-CM | POA: Insufficient documentation

## 2020-01-05 DIAGNOSIS — H35369 Drusen (degenerative) of macula, unspecified eye: Secondary | ICD-10-CM | POA: Insufficient documentation

## 2020-01-05 DIAGNOSIS — H3530 Unspecified macular degeneration: Secondary | ICD-10-CM | POA: Insufficient documentation

## 2020-01-05 DIAGNOSIS — B351 Tinea unguium: Secondary | ICD-10-CM

## 2020-01-05 DIAGNOSIS — M5430 Sciatica, unspecified side: Secondary | ICD-10-CM | POA: Insufficient documentation

## 2020-01-05 DIAGNOSIS — M79674 Pain in right toe(s): Secondary | ICD-10-CM | POA: Diagnosis not present

## 2020-01-05 DIAGNOSIS — K449 Diaphragmatic hernia without obstruction or gangrene: Secondary | ICD-10-CM | POA: Insufficient documentation

## 2020-01-05 DIAGNOSIS — D509 Iron deficiency anemia, unspecified: Secondary | ICD-10-CM | POA: Insufficient documentation

## 2020-01-05 DIAGNOSIS — F633 Trichotillomania: Secondary | ICD-10-CM | POA: Insufficient documentation

## 2020-01-05 DIAGNOSIS — F411 Generalized anxiety disorder: Secondary | ICD-10-CM | POA: Insufficient documentation

## 2020-01-05 DIAGNOSIS — R600 Localized edema: Secondary | ICD-10-CM | POA: Insufficient documentation

## 2020-01-05 DIAGNOSIS — M79675 Pain in left toe(s): Secondary | ICD-10-CM | POA: Diagnosis not present

## 2020-01-05 DIAGNOSIS — F639 Impulse disorder, unspecified: Secondary | ICD-10-CM | POA: Insufficient documentation

## 2020-01-05 DIAGNOSIS — N183 Chronic kidney disease, stage 3 unspecified: Secondary | ICD-10-CM | POA: Insufficient documentation

## 2020-01-05 DIAGNOSIS — I890 Lymphedema, not elsewhere classified: Secondary | ICD-10-CM | POA: Insufficient documentation

## 2020-01-05 DIAGNOSIS — F324 Major depressive disorder, single episode, in partial remission: Secondary | ICD-10-CM | POA: Insufficient documentation

## 2020-01-05 DIAGNOSIS — F339 Major depressive disorder, recurrent, unspecified: Secondary | ICD-10-CM | POA: Insufficient documentation

## 2020-01-05 DIAGNOSIS — M8588 Other specified disorders of bone density and structure, other site: Secondary | ICD-10-CM | POA: Insufficient documentation

## 2020-01-05 DIAGNOSIS — R5381 Other malaise: Secondary | ICD-10-CM | POA: Insufficient documentation

## 2020-01-05 DIAGNOSIS — R54 Age-related physical debility: Secondary | ICD-10-CM | POA: Insufficient documentation

## 2020-01-05 DIAGNOSIS — L719 Rosacea, unspecified: Secondary | ICD-10-CM | POA: Insufficient documentation

## 2020-01-05 DIAGNOSIS — I720 Aneurysm of carotid artery: Secondary | ICD-10-CM | POA: Insufficient documentation

## 2020-01-05 DIAGNOSIS — H9193 Unspecified hearing loss, bilateral: Secondary | ICD-10-CM | POA: Insufficient documentation

## 2020-01-05 DIAGNOSIS — H04129 Dry eye syndrome of unspecified lacrimal gland: Secondary | ICD-10-CM | POA: Insufficient documentation

## 2020-01-05 DIAGNOSIS — H35039 Hypertensive retinopathy, unspecified eye: Secondary | ICD-10-CM | POA: Insufficient documentation

## 2020-01-05 DIAGNOSIS — R7303 Prediabetes: Secondary | ICD-10-CM | POA: Insufficient documentation

## 2020-01-05 DIAGNOSIS — I214 Non-ST elevation (NSTEMI) myocardial infarction: Secondary | ICD-10-CM | POA: Insufficient documentation

## 2020-01-05 DIAGNOSIS — G43909 Migraine, unspecified, not intractable, without status migrainosus: Secondary | ICD-10-CM | POA: Insufficient documentation

## 2020-01-05 DIAGNOSIS — J479 Bronchiectasis, uncomplicated: Secondary | ICD-10-CM | POA: Insufficient documentation

## 2020-01-05 DIAGNOSIS — E639 Nutritional deficiency, unspecified: Secondary | ICD-10-CM | POA: Insufficient documentation

## 2020-01-05 DIAGNOSIS — K0510 Chronic gingivitis, plaque induced: Secondary | ICD-10-CM | POA: Insufficient documentation

## 2020-01-05 DIAGNOSIS — E46 Unspecified protein-calorie malnutrition: Secondary | ICD-10-CM | POA: Insufficient documentation

## 2020-01-05 DIAGNOSIS — A319 Mycobacterial infection, unspecified: Secondary | ICD-10-CM | POA: Insufficient documentation

## 2020-01-05 DIAGNOSIS — R918 Other nonspecific abnormal finding of lung field: Secondary | ICD-10-CM | POA: Insufficient documentation

## 2020-01-05 DIAGNOSIS — R1310 Dysphagia, unspecified: Secondary | ICD-10-CM | POA: Insufficient documentation

## 2020-01-05 DIAGNOSIS — R739 Hyperglycemia, unspecified: Secondary | ICD-10-CM | POA: Insufficient documentation

## 2020-01-05 MED ORDER — MUPIROCIN 2 % EX OINT: TOPICAL_OINTMENT | CUTANEOUS | 1 refills | Status: DC

## 2020-01-10 DIAGNOSIS — Z1159 Encounter for screening for other viral diseases: Secondary | ICD-10-CM | POA: Diagnosis not present

## 2020-01-10 DIAGNOSIS — Z20828 Contact with and (suspected) exposure to other viral communicable diseases: Secondary | ICD-10-CM | POA: Diagnosis not present

## 2020-01-12 ENCOUNTER — Ambulatory Visit: Payer: Medicare Other | Admitting: Emergency Medicine

## 2020-01-13 NOTE — Progress Notes (Signed)
Subjective: Sarah [name] presents today for follow up of painful mycotic toenails b/l that are difficult to trim. Pain interferes with ambulation. Aggravating factors include wearing enclosed shoe gear. Pain is relieved with periodic professional debridement.   Her daughter is present during today's visit.   Sarah [name] states she attempted to cut her toenails.and injured her left 5th digit.  Allergies  Allergen Reactions  . Codeine Anaphylaxis  . Hydrocodone Nausea And Vomiting and Other (See Comments)    SYNCOPE AND BRADYCARDIA  . Isosorbide Other (See Comments)    BRADYCARDIA  . Isosorbide Nitrate Other (See Comments)    BRADYCARDIA  . Oxycodone Palpitations    Rapid heart beat  . Clarithromycin Nausea And Vomiting    Has patient had a PCN reaction causing immediate rash, facial/tongue/throat swelling, SOB or lightheadedness with hypotension: Unknown Has patient had a PCN reaction causing severe rash involving mucus membranes or skin necrosis: Unknown Has patient had a PCN reaction that required hospitalization: Unknown Has patient had a PCN reaction occurring within the last 10 years: Unknown If all of the above answers are "NO", then may proceed with Cephalosporin use.   . Fiorinal [Butalbital-Aspirin-Caffeine] Swelling and Other (See Comments)    Facial swelling   . Levofloxacin Nausea And Vomiting and Other (See Comments)    SYNCOPE ALSO  . Augmentin [Amoxicillin-Pot Clavulanate] Diarrhea  . Doxycycline     Sweats and aches     Objective: There were no vitals filed for this visit.  Pt 86 y.o. year old female  in NAD. AAO x 3.   Vascular Examination:  Capillary fill time to digits <3 seconds b/l lower extremities. Palpable pedal pulses b/l LE. Pedal hair absent. Lower extremity skin temperature gradient warm to cool. No ischemia or gangrene noted b/l lower extremities.  Dermatological Examination: Pedal skin is thin shiny, atrophic b/l lower extremities. No  interdigital macerations bilaterally. Toenails 1-5 b/l elongated, discolored, dystrophic, thickened, crumbly with subungual debris and tenderness to dorsal palpation. Hyperkeratotic lesion(s) R 2nd toe and R 5th toe.  No erythema, no edema, no drainage, no fluctuance.   Healing laceration noted distal tip of left 5th toe. No erythema, no edema, no drainage, no fluctuance..    Musculoskeletal: Normal muscle strength 5/5 to all lower extremity muscle groups bilaterally, no pain crepitus or joint limitation noted with ROM b/l and hammertoes noted to the  2-5 bilaterally.  Neurological: Pt has subjective symptoms of neuropathy. Protective sensation intact 5/5 intact bilaterally with 10g monofilament b/l.Marland Kitchen  Assessment: 1. Pain due to onychomycosis of toenails of both feet   2. Laceration of fifth toe of left foot, initial encounter    Plan: -I have asked her daughter to remove sharp instruments from Sarah [name]'s home to avoid injuries as she is on Plavix. -Prescription written for Mupirocin Ointment. Patient is to apply to left 5th toe once daily. -Toenails 1-5 b/l were debrided in length and girth with sterile nail nippers and dremel without iatrogenic bleeding.  -Corn(s) debrided right 2nd toe and R 5th toe without incident. Total number debrided=2. -Patient to continue soft, supportive shoe gear daily. -Patient to report any pedal injuries to medical professional immediately. -Patient/POA to call should there be question/concern in the interim.  Return in about 3 months (around 04/04/2020).

## 2020-01-21 ENCOUNTER — Encounter (HOSPITAL_COMMUNITY): Payer: Self-pay | Admitting: *Deleted

## 2020-01-21 ENCOUNTER — Other Ambulatory Visit: Payer: Self-pay

## 2020-01-21 ENCOUNTER — Emergency Department (HOSPITAL_COMMUNITY): Payer: Medicare Other

## 2020-01-21 ENCOUNTER — Inpatient Hospital Stay (HOSPITAL_COMMUNITY)
Admission: EM | Admit: 2020-01-21 | Discharge: 2020-01-27 | DRG: 521 | Disposition: A | Discharge status: Skilled Nursing Facility | Payer: Medicare Other | Source: Skilled Nursing Facility | Attending: Internal Medicine | Admitting: Internal Medicine

## 2020-01-21 DIAGNOSIS — Z66 Do not resuscitate: Secondary | ICD-10-CM | POA: Diagnosis not present

## 2020-01-21 DIAGNOSIS — I130 Hypertensive heart and chronic kidney disease with heart failure and stage 1 through stage 4 chronic kidney disease, or unspecified chronic kidney disease: Secondary | ICD-10-CM | POA: Diagnosis present

## 2020-01-21 DIAGNOSIS — F32A Depression, unspecified: Secondary | ICD-10-CM | POA: Diagnosis present

## 2020-01-21 DIAGNOSIS — W19XXXA Unspecified fall, initial encounter: Secondary | ICD-10-CM | POA: Diagnosis not present

## 2020-01-21 DIAGNOSIS — Z20822 Contact with and (suspected) exposure to covid-19: Secondary | ICD-10-CM | POA: Diagnosis present

## 2020-01-21 DIAGNOSIS — J479 Bronchiectasis, uncomplicated: Secondary | ICD-10-CM

## 2020-01-21 DIAGNOSIS — R1310 Dysphagia, unspecified: Secondary | ICD-10-CM | POA: Diagnosis not present

## 2020-01-21 DIAGNOSIS — E785 Hyperlipidemia, unspecified: Secondary | ICD-10-CM | POA: Diagnosis present

## 2020-01-21 DIAGNOSIS — E860 Dehydration: Secondary | ICD-10-CM | POA: Diagnosis present

## 2020-01-21 DIAGNOSIS — Z7401 Bed confinement status: Secondary | ICD-10-CM | POA: Diagnosis not present

## 2020-01-21 DIAGNOSIS — R54 Age-related physical debility: Secondary | ICD-10-CM | POA: Diagnosis present

## 2020-01-21 DIAGNOSIS — Y92129 Unspecified place in nursing home as the place of occurrence of the external cause: Secondary | ICD-10-CM

## 2020-01-21 DIAGNOSIS — D509 Iron deficiency anemia, unspecified: Secondary | ICD-10-CM | POA: Diagnosis present

## 2020-01-21 DIAGNOSIS — K219 Gastro-esophageal reflux disease without esophagitis: Secondary | ICD-10-CM | POA: Diagnosis not present

## 2020-01-21 DIAGNOSIS — J9811 Atelectasis: Secondary | ICD-10-CM | POA: Diagnosis not present

## 2020-01-21 DIAGNOSIS — R1312 Dysphagia, oropharyngeal phase: Secondary | ICD-10-CM | POA: Diagnosis not present

## 2020-01-21 DIAGNOSIS — G609 Hereditary and idiopathic neuropathy, unspecified: Secondary | ICD-10-CM | POA: Diagnosis present

## 2020-01-21 DIAGNOSIS — R0602 Shortness of breath: Secondary | ICD-10-CM | POA: Diagnosis not present

## 2020-01-21 DIAGNOSIS — E559 Vitamin D deficiency, unspecified: Secondary | ICD-10-CM | POA: Diagnosis present

## 2020-01-21 DIAGNOSIS — I2583 Coronary atherosclerosis due to lipid rich plaque: Secondary | ICD-10-CM

## 2020-01-21 DIAGNOSIS — Z9181 History of falling: Secondary | ICD-10-CM | POA: Diagnosis not present

## 2020-01-21 DIAGNOSIS — E039 Hypothyroidism, unspecified: Secondary | ICD-10-CM | POA: Diagnosis not present

## 2020-01-21 DIAGNOSIS — Z87892 Personal history of anaphylaxis: Secondary | ICD-10-CM

## 2020-01-21 DIAGNOSIS — R52 Pain, unspecified: Secondary | ICD-10-CM | POA: Diagnosis not present

## 2020-01-21 DIAGNOSIS — S72002A Fracture of unspecified part of neck of left femur, initial encounter for closed fracture: Secondary | ICD-10-CM

## 2020-01-21 DIAGNOSIS — I214 Non-ST elevation (NSTEMI) myocardial infarction: Secondary | ICD-10-CM | POA: Diagnosis not present

## 2020-01-21 DIAGNOSIS — J45909 Unspecified asthma, uncomplicated: Secondary | ICD-10-CM | POA: Diagnosis present

## 2020-01-21 DIAGNOSIS — S72041A Displaced fracture of base of neck of right femur, initial encounter for closed fracture: Secondary | ICD-10-CM | POA: Diagnosis not present

## 2020-01-21 DIAGNOSIS — Z885 Allergy status to narcotic agent status: Secondary | ICD-10-CM

## 2020-01-21 DIAGNOSIS — R41841 Cognitive communication deficit: Secondary | ICD-10-CM | POA: Diagnosis not present

## 2020-01-21 DIAGNOSIS — T380X5A Adverse effect of glucocorticoids and synthetic analogues, initial encounter: Secondary | ICD-10-CM | POA: Diagnosis not present

## 2020-01-21 DIAGNOSIS — Z4789 Encounter for other orthopedic aftercare: Secondary | ICD-10-CM | POA: Diagnosis not present

## 2020-01-21 DIAGNOSIS — Z7952 Long term (current) use of systemic steroids: Secondary | ICD-10-CM

## 2020-01-21 DIAGNOSIS — Z79899 Other long term (current) drug therapy: Secondary | ICD-10-CM

## 2020-01-21 DIAGNOSIS — Z792 Long term (current) use of antibiotics: Secondary | ICD-10-CM

## 2020-01-21 DIAGNOSIS — G459 Transient cerebral ischemic attack, unspecified: Secondary | ICD-10-CM | POA: Diagnosis present

## 2020-01-21 DIAGNOSIS — K449 Diaphragmatic hernia without obstruction or gangrene: Secondary | ICD-10-CM | POA: Diagnosis present

## 2020-01-21 DIAGNOSIS — Z888 Allergy status to other drugs, medicaments and biological substances status: Secondary | ICD-10-CM

## 2020-01-21 DIAGNOSIS — M19042 Primary osteoarthritis, left hand: Secondary | ICD-10-CM | POA: Diagnosis present

## 2020-01-21 DIAGNOSIS — Z7902 Long term (current) use of antithrombotics/antiplatelets: Secondary | ICD-10-CM

## 2020-01-21 DIAGNOSIS — D72829 Elevated white blood cell count, unspecified: Secondary | ICD-10-CM | POA: Diagnosis not present

## 2020-01-21 DIAGNOSIS — J9601 Acute respiratory failure with hypoxia: Secondary | ICD-10-CM | POA: Diagnosis not present

## 2020-01-21 DIAGNOSIS — B351 Tinea unguium: Secondary | ICD-10-CM | POA: Diagnosis not present

## 2020-01-21 DIAGNOSIS — R0600 Dyspnea, unspecified: Secondary | ICD-10-CM | POA: Diagnosis not present

## 2020-01-21 DIAGNOSIS — M2550 Pain in unspecified joint: Secondary | ICD-10-CM | POA: Diagnosis not present

## 2020-01-21 DIAGNOSIS — F419 Anxiety disorder, unspecified: Secondary | ICD-10-CM | POA: Diagnosis present

## 2020-01-21 DIAGNOSIS — R269 Unspecified abnormalities of gait and mobility: Secondary | ICD-10-CM | POA: Diagnosis not present

## 2020-01-21 DIAGNOSIS — M19041 Primary osteoarthritis, right hand: Secondary | ICD-10-CM | POA: Diagnosis present

## 2020-01-21 DIAGNOSIS — R293 Abnormal posture: Secondary | ICD-10-CM | POA: Diagnosis not present

## 2020-01-21 DIAGNOSIS — M6281 Muscle weakness (generalized): Secondary | ICD-10-CM | POA: Diagnosis not present

## 2020-01-21 DIAGNOSIS — F411 Generalized anxiety disorder: Secondary | ICD-10-CM | POA: Diagnosis present

## 2020-01-21 DIAGNOSIS — I10 Essential (primary) hypertension: Secondary | ICD-10-CM | POA: Diagnosis present

## 2020-01-21 DIAGNOSIS — Z8673 Personal history of transient ischemic attack (TIA), and cerebral infarction without residual deficits: Secondary | ICD-10-CM

## 2020-01-21 DIAGNOSIS — S72001A Fracture of unspecified part of neck of right femur, initial encounter for closed fracture: Principal | ICD-10-CM | POA: Diagnosis present

## 2020-01-21 DIAGNOSIS — N1831 Chronic kidney disease, stage 3a: Secondary | ICD-10-CM | POA: Diagnosis not present

## 2020-01-21 DIAGNOSIS — Z419 Encounter for procedure for purposes other than remedying health state, unspecified: Secondary | ICD-10-CM

## 2020-01-21 DIAGNOSIS — R2689 Other abnormalities of gait and mobility: Secondary | ICD-10-CM | POA: Diagnosis not present

## 2020-01-21 DIAGNOSIS — M479 Spondylosis, unspecified: Secondary | ICD-10-CM | POA: Diagnosis present

## 2020-01-21 DIAGNOSIS — I2510 Atherosclerotic heart disease of native coronary artery without angina pectoris: Secondary | ICD-10-CM | POA: Diagnosis present

## 2020-01-21 DIAGNOSIS — J309 Allergic rhinitis, unspecified: Secondary | ICD-10-CM | POA: Diagnosis present

## 2020-01-21 DIAGNOSIS — S72001S Fracture of unspecified part of neck of right femur, sequela: Secondary | ICD-10-CM | POA: Diagnosis not present

## 2020-01-21 DIAGNOSIS — R2681 Unsteadiness on feet: Secondary | ICD-10-CM | POA: Diagnosis not present

## 2020-01-21 DIAGNOSIS — Z8249 Family history of ischemic heart disease and other diseases of the circulatory system: Secondary | ICD-10-CM

## 2020-01-21 DIAGNOSIS — N183 Chronic kidney disease, stage 3 unspecified: Secondary | ICD-10-CM | POA: Diagnosis present

## 2020-01-21 DIAGNOSIS — I5032 Chronic diastolic (congestive) heart failure: Secondary | ICD-10-CM | POA: Diagnosis not present

## 2020-01-21 DIAGNOSIS — H9193 Unspecified hearing loss, bilateral: Secondary | ICD-10-CM | POA: Diagnosis present

## 2020-01-21 DIAGNOSIS — Z79890 Hormone replacement therapy (postmenopausal): Secondary | ICD-10-CM

## 2020-01-21 DIAGNOSIS — M1990 Unspecified osteoarthritis, unspecified site: Secondary | ICD-10-CM | POA: Diagnosis present

## 2020-01-21 DIAGNOSIS — I252 Old myocardial infarction: Secondary | ICD-10-CM

## 2020-01-21 DIAGNOSIS — H409 Unspecified glaucoma: Secondary | ICD-10-CM | POA: Diagnosis present

## 2020-01-21 DIAGNOSIS — Z823 Family history of stroke: Secondary | ICD-10-CM

## 2020-01-21 DIAGNOSIS — S79929A Unspecified injury of unspecified thigh, initial encounter: Secondary | ICD-10-CM | POA: Diagnosis not present

## 2020-01-21 DIAGNOSIS — M25551 Pain in right hip: Secondary | ICD-10-CM | POA: Diagnosis not present

## 2020-01-21 LAB — URINALYSIS, ROUTINE W REFLEX MICROSCOPIC
Bilirubin Urine: NEGATIVE
Glucose, UA: NEGATIVE mg/dL
Hgb urine dipstick: NEGATIVE
Ketones, ur: NEGATIVE mg/dL
Leukocytes,Ua: NEGATIVE
Nitrite: NEGATIVE
Protein, ur: NEGATIVE mg/dL
Specific Gravity, Urine: 1.01 (ref 1.005–1.030)
pH: 8 (ref 5.0–8.0)

## 2020-01-21 LAB — CBC WITH DIFFERENTIAL/PLATELET
Abs Immature Granulocytes: 0.1 10*3/uL — ABNORMAL HIGH (ref 0.00–0.07)
Basophils Absolute: 0.1 10*3/uL (ref 0.0–0.1)
Basophils Relative: 0 %
Eosinophils Absolute: 0 10*3/uL (ref 0.0–0.5)
Eosinophils Relative: 0 %
HCT: 52.7 % — ABNORMAL HIGH (ref 36.0–46.0)
Hemoglobin: 16.4 g/dL — ABNORMAL HIGH (ref 12.0–15.0)
Immature Granulocytes: 1 %
Lymphocytes Relative: 5 %
Lymphs Abs: 0.7 10*3/uL (ref 0.7–4.0)
MCH: 32.1 pg (ref 26.0–34.0)
MCHC: 31.1 g/dL (ref 30.0–36.0)
MCV: 103.1 fL — ABNORMAL HIGH (ref 80.0–100.0)
Monocytes Absolute: 0.4 10*3/uL (ref 0.1–1.0)
Monocytes Relative: 3 %
Neutro Abs: 14.1 10*3/uL — ABNORMAL HIGH (ref 1.7–7.7)
Neutrophils Relative %: 91 %
Platelets: 277 10*3/uL (ref 150–400)
RBC: 5.11 MIL/uL (ref 3.87–5.11)
RDW: 12 % (ref 11.5–15.5)
WBC: 15.4 10*3/uL — ABNORMAL HIGH (ref 4.0–10.5)
nRBC: 0 % (ref 0.0–0.2)

## 2020-01-21 LAB — BASIC METABOLIC PANEL
Anion gap: 9 (ref 5–15)
BUN: 14 mg/dL (ref 8–23)
CO2: 32 mmol/L (ref 22–32)
Calcium: 8.7 mg/dL — ABNORMAL LOW (ref 8.9–10.3)
Chloride: 95 mmol/L — ABNORMAL LOW (ref 98–111)
Creatinine, Ser: 1.03 mg/dL — ABNORMAL HIGH (ref 0.44–1.00)
GFR, Estimated: 50 mL/min — ABNORMAL LOW (ref >60)
Glucose, Bld: 153 mg/dL — ABNORMAL HIGH (ref 70–99)
Potassium: 4.8 mmol/L (ref 3.5–5.1)
Sodium: 136 mmol/L (ref 135–145)

## 2020-01-21 LAB — ABO/RH: ABO/RH(D): O NEG

## 2020-01-21 LAB — PROTIME-INR
INR: 0.9 (ref 0.8–1.2)
Prothrombin Time: 11.8 seconds (ref 11.4–15.2)

## 2020-01-21 LAB — SARS CORONAVIRUS 2 (TAT 6-24 HRS): SARS Coronavirus 2: NEGATIVE

## 2020-01-21 MED ORDER — POLYSACCHARIDE IRON COMPLEX 150 MG PO CAPS
150.0000 mg | ORAL_CAPSULE | Freq: Every day | ORAL | Status: DC
  Administered 2020-01-22: 150 mg via ORAL
  Filled 2020-01-21: qty 1

## 2020-01-21 MED ORDER — ESCITALOPRAM OXALATE 10 MG PO TABS
5.0000 mg | ORAL_TABLET | Freq: Every day | ORAL | Status: DC
  Administered 2020-01-22 – 2020-01-27 (×5): 5 mg via ORAL
  Filled 2020-01-21 (×5): qty 1

## 2020-01-21 MED ORDER — LATANOPROST 0.005 % OP SOLN
1.0000 [drp] | Freq: Every day | OPHTHALMIC | Status: DC
  Administered 2020-01-21 – 2020-01-26 (×6): 1 [drp] via OPHTHALMIC
  Filled 2020-01-21: qty 2.5

## 2020-01-21 MED ORDER — ALPRAZOLAM 0.5 MG PO TABS
0.5000 mg | ORAL_TABLET | Freq: Two times a day (BID) | ORAL | Status: DC
  Administered 2020-01-21 – 2020-01-27 (×12): 0.5 mg via ORAL
  Filled 2020-01-21 (×12): qty 1

## 2020-01-21 MED ORDER — SODIUM CHLORIDE 0.9 % IV SOLN: INTRAVENOUS | Status: DC

## 2020-01-21 MED ORDER — POLYETHYLENE GLYCOL 3350 17 G PO PACK: 17.0000 g | PACK | Freq: Every day | ORAL | Status: DC | PRN

## 2020-01-21 MED ORDER — CARVEDILOL 6.25 MG PO TABS
6.2500 mg | ORAL_TABLET | Freq: Two times a day (BID) | ORAL | Status: DC
  Administered 2020-01-21 – 2020-01-27 (×12): 6.25 mg via ORAL
  Filled 2020-01-21: qty 2
  Filled 2020-01-21 (×8): qty 1
  Filled 2020-01-21: qty 2
  Filled 2020-01-21 (×2): qty 1

## 2020-01-21 MED ORDER — ALPRAZOLAM 0.5 MG PO TABS
0.5000 mg | ORAL_TABLET | Freq: Every day | ORAL | Status: DC | PRN
  Administered 2020-01-22: 05:00:00 0.5 mg via ORAL
  Filled 2020-01-21: qty 1

## 2020-01-21 MED ORDER — ACETAMINOPHEN 500 MG PO TABS
500.0000 mg | ORAL_TABLET | Freq: Three times a day (TID) | ORAL | Status: DC
  Administered 2020-01-21 – 2020-01-27 (×16): 500 mg via ORAL
  Filled 2020-01-21 (×16): qty 1

## 2020-01-21 MED ORDER — DULOXETINE HCL 20 MG PO CPEP
20.0000 mg | ORAL_CAPSULE | Freq: Three times a day (TID) | ORAL | Status: DC
  Administered 2020-01-21 – 2020-01-27 (×16): 20 mg via ORAL
  Filled 2020-01-21 (×20): qty 1

## 2020-01-21 MED ORDER — NETARSUDIL-LATANOPROST 0.02-0.005 % OP SOLN: 1.0000 [drp] | Freq: Every day | OPHTHALMIC | Status: DC

## 2020-01-21 MED ORDER — PANTOPRAZOLE SODIUM 40 MG PO TBEC
40.0000 mg | DELAYED_RELEASE_TABLET | Freq: Two times a day (BID) | ORAL | Status: DC
  Administered 2020-01-21 – 2020-01-27 (×11): 40 mg via ORAL
  Filled 2020-01-21 (×11): qty 1

## 2020-01-21 MED ORDER — CALCIUM CARBONATE ANTACID 500 MG PO CHEW: 1.0000 | CHEWABLE_TABLET | Freq: Three times a day (TID) | ORAL | Status: DC | PRN

## 2020-01-21 MED ORDER — METHOCARBAMOL 500 MG PO TABS
500.0000 mg | ORAL_TABLET | Freq: Four times a day (QID) | ORAL | Status: DC | PRN
  Administered 2020-01-22: 500 mg via ORAL
  Filled 2020-01-21: qty 1

## 2020-01-21 MED ORDER — FUSION PLUS PO CAPS: 1.0000 | ORAL_CAPSULE | Freq: Every day | ORAL | Status: DC

## 2020-01-21 MED ORDER — TRAMADOL HCL 50 MG PO TABS
50.0000 mg | ORAL_TABLET | Freq: Four times a day (QID) | ORAL | Status: DC | PRN
  Administered 2020-01-22: 50 mg via ORAL

## 2020-01-21 MED ORDER — PREDNISONE 5 MG PO TABS
10.0000 mg | ORAL_TABLET | Freq: Every day | ORAL | Status: DC
  Administered 2020-01-22 – 2020-01-27 (×5): 10 mg via ORAL
  Filled 2020-01-21 (×5): qty 2
  Filled 2020-01-21: qty 1

## 2020-01-21 MED ORDER — LEVOTHYROXINE SODIUM 50 MCG PO TABS
50.0000 ug | ORAL_TABLET | Freq: Every day | ORAL | Status: DC
  Administered 2020-01-22 – 2020-01-27 (×6): 50 ug via ORAL
  Filled 2020-01-21 (×6): qty 1

## 2020-01-21 MED ORDER — FENTANYL CITRATE (PF) 100 MCG/2ML IJ SOLN
50.0000 ug | INTRAMUSCULAR | Status: AC | PRN
  Administered 2020-01-21: 50 ug via INTRAVENOUS
  Filled 2020-01-21 (×2): qty 2

## 2020-01-21 MED ORDER — FLEET ENEMA 7-19 GM/118ML RE ENEM: 1.0000 | ENEMA | Freq: Once | RECTAL | Status: DC | PRN

## 2020-01-21 MED ORDER — SORBITOL 70 % SOLN
30.0000 mL | Freq: Every day | Status: DC | PRN
  Filled 2020-01-21: qty 30

## 2020-01-21 MED ORDER — AMLODIPINE BESYLATE 5 MG PO TABS
2.5000 mg | ORAL_TABLET | Freq: Every evening | ORAL | Status: DC
Start: 2020-01-21 — End: 2020-01-27
  Administered 2020-01-21 – 2020-01-26 (×2): 2.5 mg via ORAL
  Filled 2020-01-21 (×3): qty 1

## 2020-01-21 MED ORDER — METHOCARBAMOL 1000 MG/10ML IJ SOLN
500.0000 mg | Freq: Four times a day (QID) | INTRAVENOUS | Status: DC | PRN
  Filled 2020-01-21: qty 5

## 2020-01-21 MED ORDER — ENOXAPARIN SODIUM 30 MG/0.3ML ~~LOC~~ SOLN: 30.0000 mg | SUBCUTANEOUS | Status: DC

## 2020-01-21 MED ORDER — ALBUTEROL SULFATE (2.5 MG/3ML) 0.083% IN NEBU
2.5000 mg | INHALATION_SOLUTION | Freq: Two times a day (BID) | RESPIRATORY_TRACT | Status: DC
  Administered 2020-01-22 – 2020-01-27 (×11): 2.5 mg via RESPIRATORY_TRACT
  Filled 2020-01-21 (×14): qty 3

## 2020-01-21 MED ORDER — FENTANYL CITRATE (PF) 100 MCG/2ML IJ SOLN
50.0000 ug | INTRAMUSCULAR | Status: AC | PRN
  Administered 2020-01-21 (×2): 50 ug via INTRAVENOUS
  Filled 2020-01-21 (×2): qty 2

## 2020-01-21 MED ORDER — CLOPIDOGREL BISULFATE 75 MG PO TABS
75.0000 mg | ORAL_TABLET | Freq: Every day | ORAL | Status: DC
  Administered 2020-01-22: 75 mg via ORAL
  Filled 2020-01-21: qty 1

## 2020-01-21 MED ORDER — SENNA 8.6 MG PO TABS
1.0000 | ORAL_TABLET | Freq: Every day | ORAL | Status: DC | PRN
  Administered 2020-01-26: 8.6 mg via ORAL
  Filled 2020-01-21: qty 1

## 2020-01-21 NOTE — ED Triage Notes (Signed)
[facility] transferred pt from [place] [place] to [facility] ED and reports the following:  Unwitnessed fall. Pt on floor for 2 hours. Shortening and rotation to right foot. Did not hit head. On Plavix. Pt daughter was at [employer] and at [employer]. Daughter said, pt has had reactions to codeine and oxy. EMS gave 50 mcg fentanyl around 12:40 PM. Hx COPD. oxygen was mid to upper 80s upon EMS arrival. Daughter said baseline O2 low 90s. AOx4. DNR at bedside. Pt walks with cane and walker at facility.

## 2020-01-21 NOTE — H&P (Signed)
History and Physical    Sarah [name] TLX:[id number] DOB: 10/09/1921 DOA: 01/21/2020  PCP: [name], MD  Patient coming from: Independent living facility  I have personally briefly reviewed patient's old medical records in [place]  Chief Complaint: Right leg pain/fall  HPI: Sarah [name] is a 87 y.o. female with medical history significant of chronic shortness of breath/bronchiectasis being followed by pulmonary, [name], gastroesophageal reflux disease, diastolic dysfunction, history of hiatal hernia, history of legionnaires disease, hypertension, hypothyroidism, prior history of non-STEMI in 2013 with normal coronaries per cath, history of TIA, functional lady presenting to the ED with right lower extremity pain.  Patient stated she got up and was reaching over to get her pendant when she subsequently fell forward and was unable to get off the floor with complaints of right leg pain with inability to move it.  Patient stated was on the floor for about 2 hours prior to being found.  Patient denies any syncope, no lightheadedness, no dizziness, no chest pain, no change in chronic shortness of breath, no nausea, no vomiting, no abdominal pain, no diarrhea, no constipation, no dysuria, no melena, no hematemesis, no hematochezia, no focal neurological symptoms.  Patient does endorse urinary frequency.  Patient stated was diagnosed a few months ago with a UTI. Patient stated has been vaccinated with a moderate vaccine 03/04/2019, 02/04/2019 as well as receiving the booster in about September of October 2021. Patient denies any recent heart attacks.  It is noted that patient walks around the square at her facility with the aid of a cane and a walker with no recent changes.  ED Course: Patient seen in the ED chest x-ray done negative for any acute abnormalities, no fracture or pneumothorax, stable irregular nodule in the right upper lobe, moderate stable hiatal hernia..  Basic metabolic  profile obtained with a chloride of 95, calcium of 8.7 otherwise was unremarkable.  CBC done with a white count of 15.4, hemoglobin of 16.4 otherwise was within normal limits.  INR noted at 0.9.  Urinalysis pending.  SARS coronavirus 2 PCR pending.  Plain films of the pelvis and right hip with right femoral neck fracture.  EKG with normal sinus rhythm, left axis deviation, nonspecific T wave changes, no ischemic changes noted.  Review of Systems: As per HPI otherwise all other systems reviewed and are negative.  Past Medical History:  Diagnosis Date  . Allergic rhinitis   . Anxiety    Anxiety attacks  . Arthritis    "a little; in my back and hands" (04/15/2012)  . Asthma   . Bronchiectasis    with history of Legionaires with chronic rales/rhonchi  . Chest pain at rest   . Daily headache    "over the last week" 04/15/2012   . Depression   . Diastolic dysfunction    a. per echo 08/2010 with normal LV function;  b. 06/2011 Echo: EF 65-70%  . GERD (gastroesophageal reflux disease)    "just recently" (04/15/2012)  . H/O hiatal hernia   . H/O Legionnaire's disease 11/1976  . History of blood transfusion 1972   "w/hysterectomy" (04/15/2012)  . Hypercholesteremia   . Hypertension    Negative renal duplex in December of 2012  . Hypothyroidism   . Idiopathic peripheral neuropathy   . MAC (mycobacterium avium-intracellulare complex)   . Migraines    "outgrew them" (04/15/2012)  . NSTEMI (non-ST elevated myocardial infarction) ([place])    a. Normal coronaries per cath August 2012 and negative CT angio for  PE;  b. 06/2011 Repeat admission w/ chest pain and elevated troponin's, CTA Chest w/o PE  . Osteopenia 02/2011   t score - 2.1  . Pneumonia    "recurrent" (04/15/2012)  . Pollen allergies   . Shortness of breath    "sometimes just lying down" (04/15/2012)  . Stroke [facility]) 06/2014   cerebellum    Past Surgical History:  Procedure Laterality Date  . APPENDECTOMY    . CARDIAC CATHETERIZATION  August  2012   Normal coronaries.  Marland Kitchen DILATION AND CURETTAGE OF UTERUS    . TONSILLECTOMY  1930  . TOTAL ABDOMINAL HYSTERECTOMY W/ BILATERAL SALPINGOOPHORECTOMY  1972   leiomyomata, menorrhagia    Social History  reports that she has never smoked. She has never used smokeless tobacco. She reports previous alcohol use. She reports that she does not use drugs.  Allergies  Allergen Reactions  . Codeine Anaphylaxis  . Hydrocodone Nausea And Vomiting and Other (See Comments)    SYNCOPE AND BRADYCARDIA  . Isosorbide Other (See Comments)    BRADYCARDIA  . Isosorbide Nitrate Other (See Comments)    BRADYCARDIA  . Oxycodone Palpitations    Rapid heart beat  . Clarithromycin Nausea And Vomiting    Has patient had a PCN reaction causing immediate rash, facial/tongue/throat swelling, SOB or lightheadedness with hypotension: Unknown Has patient had a PCN reaction causing severe rash involving mucus membranes or skin necrosis: Unknown Has patient had a PCN reaction that required hospitalization: Unknown Has patient had a PCN reaction occurring within the last 10 years: Unknown If all of the above answers are "NO", then may proceed with Cephalosporin use.   . Fiorinal [Butalbital-Aspirin-Caffeine] Swelling and Other (See Comments)    Facial swelling   . Levofloxacin Nausea And Vomiting and Other (See Comments)    SYNCOPE ALSO  . Augmentin [Amoxicillin-Pot Clavulanate] Diarrhea  . Doxycycline     Sweats and aches    Family History  Problem Relation Age of Onset  . Hypertension Mother        stroke  . Stroke Mother   . Cancer Brother        prostate  . Osteoarthritis Paternal Aunt    Mother died age 61 from hypertension Father died at age 38, laid down on the couch and died in his sleep.  Prior to Admission medications   Medication Sig Start Date End Date Taking? Authorizing Provider  acetaminophen (TYLENOL) 500 MG tablet Take 500 mg by mouth every 6 (six) hours as needed for moderate  pain.   Yes [provider]  albuterol (PROVENTIL) (2.5 MG/3ML) 0.083% nebulizer solution Take 3 mLs (2.5 mg total) by nebulization in the morning and at bedtime. DX: J47.9 10/11/19  Yes Collene Gobble, MD  ALPRAZolam Duanne Moron) 0.5 MG tablet Take 1 tablet by mouth as directed. Take 1 tablet (0.5 mg) BID at (8 am & 8 pm) & DAILY PRN FOR ANXIETY 04/06/18  Yes [name], MD  amLODipine (NORVASC) 5 MG tablet Take 2.5 mg by mouth every evening. 01/19/20  Yes [provider]  carvedilol (COREG) 6.25 MG tablet TAKE 1 TABLET(6.25 MG) BY MOUTH TWICE DAILY 12/15/19  Yes Nahser, Wonda Cheng, MD  clopidogrel (PLAVIX) 75 MG tablet TAKE 1 TABLET(75 MG) BY MOUTH DAILY 10/08/18  Yes Nahser, Wonda Cheng, MD  DULoxetine (CYMBALTA) 20 MG capsule Take 20 mg by mouth 3 (three) times daily. 06/14/14  Yes [provider]  escitalopram (LEXAPRO) 5 MG tablet Take 5 mg by mouth daily.  Yes [provider]  Iron-FA-B Cmp-C-Biot-Probiotic (FUSION PLUS) CAPS Take 1 capsule by mouth daily. 12/16/19  Yes [provider]  levothyroxine (SYNTHROID, LEVOTHROID) 50 MCG tablet Take 50 mcg by mouth daily.   Yes [provider]  pantoprazole (PROTONIX) 40 MG tablet Take 40 mg by mouth 2 (two) times daily.   Yes [provider]  predniSONE (DELTASONE) 10 MG tablet TAKE 1 TABLET(10 MG) BY MOUTH DAILY WITH BREAKFAST 11/24/19  Yes Byrum, Rose Fillers, MD  ROCKLATAN 0.02-0.005 % SOLN Place 1 drop into both eyes at bedtime. 04/07/19  Yes [provider]  SIMBRINZA 1-0.2 % SUSP Place 1 drop into the left eye in the morning, at noon, and at bedtime.  04/07/19  Yes [provider]  amLODipine (NORVASC) 10 MG tablet Take 0.5 tablets (5 mg total) by mouth daily as needed. May take an additional 0.5 mg tablet as needed for persistent elevated BP Patient not taking: Reported on 01/21/2020 01/04/19   Nahser, Wonda Cheng, MD  azithromycin (ZITHROMAX) 250 MG tablet Take 250-500 mg by mouth  as directed. Take 2 tablets on Day 1. Take 1 tablet daily for Days 2-5. Alternate Every Other Month With Cefdinir Patient taking differently: Take 250-500 mg by mouth as directed. 6 Day Course. Take 2 tablets on Day 1. Take 1 tablet daily for Days 2-5. Alternate Every Other Month With Cefdinir 12/27/19   Collene Gobble, MD  calcium carbonate (TUMS - DOSED IN MG ELEMENTAL CALCIUM) 500 MG chewable tablet Chew 1 tablet by mouth every 8 (eight) hours as needed for indigestion or heartburn.    [provider]  cefdinir (OMNICEF) 300 MG capsule Take 300 mg by mouth as directed. 6 Day Course. Take 1 capsule (300 mg) BID. Start on 26th Of The Month. Alternate Every Other Month With Azithromycin    [provider]  Elastic Bandages & Supports (JOBST ACTIVE 20-30MMHG MEDIUM) MISC See admin instructions. 11/15/18   [provider]  mupirocin ointment (BACTROBAN) 2 % Apply to affected digits once daily. Patient taking differently: Apply 1 application topically daily as needed (dry skin). 01/05/20   Marzetta Board, DPM  NONFORMULARY OR COMPOUNDED ITEM [facility]:  Neuropathy Cream #11 - Bupivacaine 1%, Doxepin 3%, Gabapentin 6%, Pentoxifylline 3%, Topiramate 1%. Apply 1-2 grams to affected areas 3-4 times a day. Patient taking differently: [facility]:  Neuropathy Cream #11 - Bupivacaine 1%, Doxepin 3%, Gabapentin 6%, Pentoxifylline 3%, Topiramate 1%. Apply 1-2 grams to affected areas 3-4 times a day PRN for Dry Skin 07/01/19   Marzetta Board, DPM  Sennosides (SENOKOT PO) Take 1 tablet by mouth daily as needed (constipation).     [provider]    Physical Exam: Vitals:   01/21/20 1310 01/21/20 1454 01/21/20 1630 01/21/20 1730  BP: (!) 160/100 (!) 165/94 (!) 149/93 115/66  Pulse: 100 94 95 86  Resp:  (!) 26 (!) 23 18  Temp: 97.7 F (36.5 C) (!) 97.4 F (36.3 C)    TempSrc: Oral Oral    SpO2: 91% 96% 95% 95%  Weight:  54.4 kg    Height:  _0   (1.626 m)      Constitutional: NAD, calm, comfortable.  Elderly female. Vitals:   01/21/20 1310 01/21/20 1454 01/21/20 1630 01/21/20 1730  BP: (!) 160/100 (!) 165/94 (!) 149/93 115/66  Pulse: 100 94 95 86  Resp:  (!) 26 (!) 23 18  Temp: 97.7 F (36.5 C) (!) 97.4 F (36.3 C)  TempSrc: Oral Oral    SpO2: 91% 96% 95% 95%  Weight:  54.4 kg    Height:  _0  (1.626 m)     Eyes: PERRL, lids and conjunctivae normal ENMT: Mucous membranes are dry. Posterior pharynx clear of any exudate or lesions.Normal dentition.  Neck: normal, supple, no masses, no thyromegaly Respiratory: Coarse breath sounds anterior lung fields.  No wheezing, no crackles.  Normal respiratory effort.  No use of accessory muscles of respiration.  Speaking in full sentences.   Cardiovascular: Regular rate and rhythm, no murmurs / rubs / gallops. No extremity edema. 2+ pedal pulses. No carotid bruits.  Abdomen: no tenderness, no masses palpated. No hepatosplenomegaly. Bowel sounds positive.  Musculoskeletal: no clubbing / cyanosis.  Right lower extremity shortened and externally rotated.  Some tenderness to palpation on the right thigh.  Skin: no rashes, lesions, ulcers. No induration Neurologic: CN 2-12 grossly intact. Sensation intact, DTR normal. Strength 5/5 in all 4.  Psychiatric: Normal judgment and insight. Alert and oriented x 3. Normal mood.   Labs on Admission: I have personally reviewed following labs and imaging studies  CBC: Recent Labs  Lab 01/21/20 1338  WBC 15.4*  NEUTROABS 14.1*  HGB 16.4*  HCT 52.7*  MCV 103.1*  PLT 505    Basic Metabolic Panel: Recent Labs  Lab 01/21/20 1338  NA 136  K 4.8  CL 95*  CO2 32  GLUCOSE 153*  BUN 14  CREATININE 1.03*  CALCIUM 8.7*    GFR: Estimated Creatinine Clearance: 27.4 mL/min (A) (by C-G formula based on SCr of 1.03 mg/dL (H)).  Liver Function Tests: No results for input(s): AST, ALT, ALKPHOS, BILITOT, PROT, ALBUMIN in the last 168  hours.  Urine analysis:    Component Value Date/Time   COLORURINE YELLOW 12/16/2018 1426   APPEARANCEUR HAZY (A) 12/16/2018 1426   LABSPEC 1.016 12/16/2018 1426   PHURINE 7.0 12/16/2018 1426   GLUCOSEU NEGATIVE 12/16/2018 1426   HGBUR NEGATIVE 12/16/2018 1426   BILIRUBINUR NEGATIVE 12/16/2018 1426   KETONESUR 5 (A) 12/16/2018 1426   PROTEINUR NEGATIVE 12/16/2018 1426   UROBILINOGEN 1.0 10/06/2014 1730   NITRITE NEGATIVE 12/16/2018 1426   LEUKOCYTESUR NEGATIVE 12/16/2018 1426    Radiological Exams on Admission: DG Chest Port 1 View  Result Date: 01/21/2020 CLINICAL DATA:  Right hip fracture following a fall. Click EXAM: PORTABLE CHEST 1 VIEW COMPARISON:  07/30/2019.  Chest CTA dated 07/02/2019. FINDINGS: Normal sized heart. Stable moderate-sized hiatal hernia. Scarring in the left mid to lower lung zone without significant change. Stable small irregular nodule in the right upper lobe. This area was obscured by multiple opacities in the right upper lobe on 07/02/2019, including multiple nodular opacities. No fracture or pneumothorax seen. IMPRESSION: 1. No fracture or pneumothorax. 2. Stable irregular nodule in the right upper lobe. 3. Stable moderate-sized hiatal hernia. Electronically Signed   By: Claudie Revering M.D.   On: 01/21/2020 14:53   DG Hip Unilat With Pelvis 2-3 Views Right  Result Date: 01/21/2020 CLINICAL DATA:  Right hip pain following a fall. EXAM: DG HIP (WITH OR WITHOUT PELVIS) 2-3V RIGHT COMPARISON:  None. FINDINGS: Right femoral neck fracture with proximal displacement and varus angulation of the distal fragment IMPRESSION: Right femoral neck fracture. Electronically Signed   By: Claudie Revering M.D.   On: 01/21/2020 14:48   DG FEMUR, MIN 2 VIEWS RIGHT  Result Date: 01/21/2020 CLINICAL DATA:  Right hip pain following a fall. EXAM: RIGHT FEMUR 2 VIEWS COMPARISON:  Right  hip radiographs obtained at the same time. FINDINGS: Previously described right femoral neck fracture. No  additional fractures or dislocations. IMPRESSION: Previously described right femoral neck fracture. Electronically Signed   By: Claudie Revering M.D.   On: 01/21/2020 14:49    EKG: Independently reviewed.  Normal sinus rhythm, left axis deviation, nonspecific ST-T wave changes.  No ischemic changes noted.  Assessment/Plan Principal Problem:   Closed displaced fracture of right femoral neck (HCC) Active Problems:   Essential hypertension   Allergic rhinitis   BRONCHIECTASIS   Idiopathic peripheral neuropathy   Depression   Hypothyroidism   Arthritis   Anxiety   Malignant hypertension   TIA (transient ischemic attack)   Chronic diastolic CHF (congestive heart failure) (HCC)   HLD (hyperlipidemia)   HTN (hypertension)   GERD (gastroesophageal reflux disease)   History of CVA (cerebrovascular accident)   CAD (coronary artery disease)   Age-related physical debility   Bilateral hearing loss   Chronic kidney disease, stage 3 unspecified (HCC)   Generalized anxiety disorder   Iron deficiency anemia   Vitamin D deficiency   1 closed right femoral neck fracture Secondary to mechanical fall.  Patient denies any syncopal episodes.  No chest pain.  No recent heart attacks.  Patient able to ambulate around the square at her facility.  Patient seen in consultation by orthopedics who are recommending hip repair to be done on Sunday, 01/23/2020.  Per orthopedics okay to continue patient's home regimen of Plavix.  SCDs for now.  Placed on scheduled Tylenol.  Ultram as needed for breakthrough pain.  Fentanyl as needed for severe breakthrough pain.  Per orthopedics.  Follow.  2.  History of bronchiectasis Patient with history of bronchiectasis and chronic cough.  Patient states undergoes chest PT.  Will place patient on incentive spirometry, flutter valve.  Continue home regimen nebulizer treatments.  Continue PPI.  Follow.  3.  History of CVA Continue home regimen Plavix for secondary stroke  prophylaxis.  4.  Chronic kidney disease stage IIIa Stable.  Follow.  5.  Leukocytosis Likely reactive leukocytosis.  Patient however with complaints of increased urinary frequency.  UA with cultures and sensitivities pending.  Chest x-ray negative for any acute infiltrate.  Supportive care.  Follow.  6.  Iron deficiency anemia Hemoglobin at 16.4 likely secondary to dehydration.  Monitor with hydration.  No overt bleeding.  Transfusion threshold hemoglobin < 7.  7.  Vitamin D deficiency Continue home regimen of oral supplementation.  8.  History of hiatal hernia/GERD PPI twice daily.  9.  Generalized anxiety disorder/depression Continue home regimen anxiolytics.  Continue Cymbalta, Lexapro.  10.  Hypertension Continue home regimen Coreg, Norvasc.  11.  Iron deficiency anemia Continue home regimen oral iron supplementation.  12.  Hypothyroidism Continue home regimen Synthroid.  13.  Coronary artery disease Stable.  Continue home regimen Plavix, Coreg, Norvasc,  DVT prophylaxis: SCDs.  DVT prophylaxis per orthopedics. Code Status:   DNR Family Communication:  Updated patient and daughter at bedside. Disposition Plan:   Patient is from:  Independent living facility   Anticipated DC to:  SNF  Anticipated DC date:  01/25/2020  Anticipated DC barriers: To be determined  Consults called:  Orthopedics: Dr. Rolena Infante 01/21/2020 Admission status: Admit to inpatient  Severity of Illness:     Irine Seal MD [facility]  How to contact the [facility] Attending or Consulting provider [place] or covering provider during after hours [place], for this patient?   1. Check the  care team in [facility] and look for a) attending/consulting [place] provider listed and b) the [facility] team listed 2. Log into [email or web address] and use [place]'s universal password to access. If you do not have the password, please contact the hospital operator. 3. Locate the [facility] provider you are looking for under [facility] and page to a number that you can be directly reached. 4. If you still have difficulty reaching the provider, please page the [facility] (Director on Call) for the Hospitalists listed on amion for assistance.  01/21/2020, 7:00 PM

## 2020-01-21 NOTE — Consult Note (Signed)
Chief Complaint: Right hip fracture  History: Sarah [name] presents today for follow up of painful mycotic toenails b/l that are difficult to trim. Pain interferes with ambulation. Aggravating factors include wearing enclosed shoe gear. Pain is relieved with periodic professional debridement.   Her daughter is present during today's visit.   Past Medical History:  Diagnosis Date  . Allergic rhinitis   . Anxiety    Anxiety attacks  . Arthritis    "a little; in my back and hands" (04/15/2012)  . Asthma   . Bronchiectasis    with history of Legionaires with chronic rales/rhonchi  . Chest pain at rest   . Daily headache    "over the last week" 04/15/2012   . Depression   . Diastolic dysfunction    a. per echo 08/2010 with normal LV function;  b. 06/2011 Echo: EF 65-70%  . GERD (gastroesophageal reflux disease)    "just recently" (04/15/2012)  . H/O hiatal hernia   . H/O Legionnaire's disease 11/1976  . History of blood transfusion 1972   "w/hysterectomy" (04/15/2012)  . Hypercholesteremia   . Hypertension    Negative renal duplex in December of 2012  . Hypothyroidism   . Idiopathic peripheral neuropathy   . MAC (mycobacterium avium-intracellulare complex)   . Migraines    "outgrew them" (04/15/2012)  . NSTEMI (non-ST elevated myocardial infarction) ([place])    a. Normal coronaries per cath August 2012 and negative CT angio for PE;  b. 06/2011 Repeat admission w/ chest pain and elevated troponin's, CTA Chest w/o PE  . Osteopenia 02/2011   t score - 2.1  . Pneumonia    "recurrent" (04/15/2012)  . Pollen allergies   . Shortness of breath    "sometimes just lying down" (04/15/2012)  . Stroke ([place]) 06/2014   cerebellum    Allergies  Allergen Reactions  . Codeine Anaphylaxis  . Hydrocodone Nausea And Vomiting and Other (See Comments)    SYNCOPE AND BRADYCARDIA  . Isosorbide Other (See Comments)    BRADYCARDIA  . Isosorbide Nitrate Other (See Comments)    BRADYCARDIA  . Oxycodone  Palpitations    Rapid heart beat  . Clarithromycin Nausea And Vomiting    Has patient had a PCN reaction causing immediate rash, facial/tongue/throat swelling, SOB or lightheadedness with hypotension: Unknown Has patient had a PCN reaction causing severe rash involving mucus membranes or skin necrosis: Unknown Has patient had a PCN reaction that required hospitalization: Unknown Has patient had a PCN reaction occurring within the last 10 years: Unknown If all of the above answers are "NO", then may proceed with Cephalosporin use.   . Fiorinal [Butalbital-Aspirin-Caffeine] Swelling and Other (See Comments)    Facial swelling   . Levofloxacin Nausea And Vomiting and Other (See Comments)    SYNCOPE ALSO  . Augmentin [Amoxicillin-Pot Clavulanate] Diarrhea  . Doxycycline     Sweats and aches    No current facility-administered medications on file prior to encounter.   Current Outpatient Medications on File Prior to Encounter  Medication Sig Dispense Refill  . acetaminophen (TYLENOL) 500 MG tablet Take 500 mg by mouth every 6 (six) hours as needed for moderate pain.    Marland Kitchen albuterol (PROVENTIL) (2.5 MG/3ML) 0.083% nebulizer solution Take 3 mLs (2.5 mg total) by nebulization in the morning and at bedtime. DX: J47.9 720 mL 3  . ALPHAGAN P 0.1 % SOLN Place 1 drop into the left eye every 8 (eight) hours.     . ALPRAZolam (XANAX) 0.5  MG tablet Take 1 tablet by mouth as directed. Take 1 tablet (0.5 mg) BID at (8 am & 8 pm) & DAILY PRN FOR ANXIETY    . amLODipine (NORVASC) 10 MG tablet Take 0.5 tablets (5 mg total) by mouth daily as needed. May take an additional 0.5 mg tablet as needed for persistent elevated BP 45 tablet 3  . azithromycin (ZITHROMAX) 250 MG tablet Take 250-500 mg by mouth as directed. Take 2 tablets on Day 1. Take 1 tablet daily for Days 2-5. Alternate Every Other Month With Cefdinir 6 tablet 2  . calcium carbonate (OS-CAL) 1250 (500 Ca) MG chewable tablet Chew by mouth.    .  calcium carbonate (TUMS - DOSED IN MG ELEMENTAL CALCIUM) 500 MG chewable tablet Chew 1 tablet by mouth every 8 (eight) hours as needed for indigestion or heartburn.    . carvedilol (COREG) 6.25 MG tablet TAKE 1 TABLET(6.25 MG) BY MOUTH TWICE DAILY 180 tablet 2  . cefdinir (OMNICEF) 300 MG capsule 1 capsule    . clopidogrel (PLAVIX) 75 MG tablet TAKE 1 TABLET(75 MG) BY MOUTH DAILY 30 tablet 7  . DULoxetine (CYMBALTA) 20 MG capsule Take 20 mg by mouth 3 (three) times daily.    Regino Schultze Bandages & Supports (JOBST ACTIVE 20-30MMHG MEDIUM) MISC See admin instructions.    Marland Kitchen escitalopram (LEXAPRO) 5 MG tablet Take 5 mg by mouth daily.     . Iron-FA-B Cmp-C-Biot-Probiotic (FUSION PLUS PO) Take 1 tablet by mouth daily.    Marland Kitchen latanoprost (XALATAN) 0.005 % ophthalmic solution 1 drop    . levothyroxine (SYNTHROID, LEVOTHROID) 50 MCG tablet Take 50 mcg by mouth daily.      Marland Kitchen loratadine (CLARITIN) 10 MG tablet Take 10 mg by mouth daily.    . mupirocin ointment (BACTROBAN) 2 % Apply to affected digits once daily. 30 g 1  . NONFORMULARY OR COMPOUNDED ITEM [facility]:  Neuropathy Cream #11 - Bupivacaine 1%, Doxepin 3%, Gabapentin 6%, Pentoxifylline 3%, Topiramate 1%. Apply 1-2 grams to affected areas 3-4 times a day. 100 each 5  . pantoprazole (PROTONIX) 40 MG tablet Take 40 mg by mouth 2 (two) times daily.    . predniSONE (DELTASONE) 10 MG tablet TAKE 1 TABLET(10 MG) BY MOUTH DAILY WITH BREAKFAST 30 tablet 5  . ROCKLATAN 0.02-0.005 % SOLN Apply 1 drop to eye at bedtime.    . Sennosides (SENOKOT PO) Take 1 tablet by mouth daily as needed (constipation).     Marland Kitchen SIMBRINZA 1-0.2 % SUSP Place 1 drop into the left eye in the morning, at noon, and at bedtime.      Review of systems: Patient denies loss of consciousness, nausea, diarrhea No fever or chills.  No cough.  Physical Exam: Vitals:   01/21/20 1454 01/21/20 1630  BP: (!) 165/94 (!) 149/93  Pulse: 94 95  Resp: (!) 26 (!) 23  Temp: (!) 97.4 F  (36.3 C)   SpO2: 96% 95%   Body mass index is 20.6 kg/m.  Patient is alert and oriented x3.  Patient is hard of hearing and uses hearing aids. No shortness of breath or chest pain at present. Lungs clear to auscultation bilaterally Cardiac: Regular rate and rhythm no rubs gallops murmurs Full range of motion in the upper extremity bilaterally with no significant shoulder, elbow, wrist pain. Abdomen is soft and nontender.  No rebound tenderness Significant pain in the right pelvis/groin.  Pain with gentle attempts at range of motion of the hip.  No significant  swelling or pain at the knee or ankle. Left lower extremity: Asymptomatic.  Full range of motion at the hip, knee, ankle with no significant pain or deformity. 1+ dorsalis pedis/posterior tibialis pulses bilaterally.  Compartments are soft and nontender bilaterally.  Image: DG Chest Port 1 View  Result Date: 01/21/2020 CLINICAL DATA:  Right hip fracture following a fall. Click EXAM: PORTABLE CHEST 1 VIEW COMPARISON:  07/30/2019.  Chest CTA dated 07/02/2019. FINDINGS: Normal sized heart. Stable moderate-sized hiatal hernia. Scarring in the left mid to lower lung zone without significant change. Stable small irregular nodule in the right upper lobe. This area was obscured by multiple opacities in the right upper lobe on 07/02/2019, including multiple nodular opacities. No fracture or pneumothorax seen. IMPRESSION: 1. No fracture or pneumothorax. 2. Stable irregular nodule in the right upper lobe. 3. Stable moderate-sized hiatal hernia. Electronically Signed   By: Claudie Revering M.D.   On: 01/21/2020 14:53   DG Hip Unilat With Pelvis 2-3 Views Right  Result Date: 01/21/2020 CLINICAL DATA:  Right hip pain following a fall. EXAM: DG HIP (WITH OR WITHOUT PELVIS) 2-3V RIGHT COMPARISON:  None. FINDINGS: Right femoral neck fracture with proximal displacement and varus angulation of the distal fragment IMPRESSION: Right femoral neck fracture.  Electronically Signed   By: Claudie Revering M.D.   On: 01/21/2020 14:48   DG FEMUR, MIN 2 VIEWS RIGHT  Result Date: 01/21/2020 CLINICAL DATA:  Right hip pain following a fall. EXAM: RIGHT FEMUR 2 VIEWS COMPARISON:  Right hip radiographs obtained at the same time. FINDINGS: Previously described right femoral neck fracture. No additional fractures or dislocations. IMPRESSION: Previously described right femoral neck fracture. Electronically Signed   By: Claudie Revering M.D.   On: 01/21/2020 14:49    A/P: Helena is a very pleasant 87 year old woman who is on Plavix and is hard of hearing who presents after mechanical fall with right hip pain.  Imaging studies confirm a right femoral neck fracture.  On clinical exam she is neurologically intact, compartments are soft and nontender, and she has intact peripheral pulses.  She denies any other extremity pain.  I have discussed the case with Dr. Lyla Glassing and he will plan on moving forward with surgical management of the hip fracture on Sunday.  Patient can continue on her Plavix.  We will move forward with a hemiarthroplasty.  Patient will be admitted to the medical service.  Recommend Tylenol or very low-dose narcotic (tramadol) for pain control to limit confusion.

## 2020-01-21 NOTE — H&P (View-Only) (Signed)
  Chief Complaint: Right hip fracture  History: Sarah [name] presents today for follow up of painful mycotic toenails b/l that are difficult to trim. Pain interferes with ambulation. Aggravating factors include wearing enclosed shoe gear. Pain is relieved with periodic professional debridement.   Her daughter is present during today's visit.   Past Medical History:  Diagnosis Date  . Allergic rhinitis   . Anxiety    Anxiety attacks  . Arthritis    "a little; in my back and hands" (04/15/2012)  . Asthma   . Bronchiectasis    with history of Legionaires with chronic rales/rhonchi  . Chest pain at rest   . Daily headache    "over the last week" 04/15/2012   . Depression   . Diastolic dysfunction    a. per echo 08/2010 with normal LV function;  b. 06/2011 Echo: EF 65-70%  . GERD (gastroesophageal reflux disease)    "just recently" (04/15/2012)  . H/O hiatal hernia   . H/O Legionnaire's disease 11/1976  . History of blood transfusion 1972   "w/hysterectomy" (04/15/2012)  . Hypercholesteremia   . Hypertension    Negative renal duplex in December of 2012  . Hypothyroidism   . Idiopathic peripheral neuropathy   . MAC (mycobacterium avium-intracellulare complex)   . Migraines    "outgrew them" (04/15/2012)  . NSTEMI (non-ST elevated myocardial infarction) ([facility])    a. Normal coronaries per cath August 2012 and negative CT angio for PE;  b. 06/2011 Repeat admission w/ chest pain and elevated troponin's, CTA Chest w/o PE  . Osteopenia 02/2011   t score - 2.1  . Pneumonia    "recurrent" (04/15/2012)  . Pollen allergies   . Shortness of breath    "sometimes just lying down" (04/15/2012)  . Stroke ([facility]) 06/2014   cerebellum    Allergies  Allergen Reactions  . Codeine Anaphylaxis  . Hydrocodone Nausea And Vomiting and Other (See Comments)    SYNCOPE AND BRADYCARDIA  . Isosorbide Other (See Comments)    BRADYCARDIA  . Isosorbide Nitrate Other (See Comments)    BRADYCARDIA  . Oxycodone  Palpitations    Rapid heart beat  . Clarithromycin Nausea And Vomiting    Has patient had a PCN reaction causing immediate rash, facial/tongue/throat swelling, SOB or lightheadedness with hypotension: Unknown Has patient had a PCN reaction causing severe rash involving mucus membranes or skin necrosis: Unknown Has patient had a PCN reaction that required hospitalization: Unknown Has patient had a PCN reaction occurring within the last 10 years: Unknown If all of the above answers are "NO", then may proceed with Cephalosporin use.   . Fiorinal [Butalbital-Aspirin-Caffeine] Swelling and Other (See Comments)    Facial swelling   . Levofloxacin Nausea And Vomiting and Other (See Comments)    SYNCOPE ALSO  . Augmentin [Amoxicillin-Pot Clavulanate] Diarrhea  . Doxycycline     Sweats and aches    No current facility-administered medications on file prior to encounter.   Current Outpatient Medications on File Prior to Encounter  Medication Sig Dispense Refill  . acetaminophen (TYLENOL) 500 MG tablet Take 500 mg by mouth every 6 (six) hours as needed for moderate pain.    . albuterol (PROVENTIL) (2.5 MG/3ML) 0.083% nebulizer solution Take 3 mLs (2.5 mg total) by nebulization in the morning and at bedtime. DX: J47.9 720 mL 3  . ALPHAGAN P 0.1 % SOLN Place 1 drop into the left eye every 8 (eight) hours.     . ALPRAZolam (XANAX) 0.5   MG tablet Take 1 tablet by mouth as directed. Take 1 tablet (0.5 mg) BID at (8 am & 8 pm) & DAILY PRN FOR ANXIETY    . amLODipine (NORVASC) 10 MG tablet Take 0.5 tablets (5 mg total) by mouth daily as needed. May take an additional 0.5 mg tablet as needed for persistent elevated BP 45 tablet 3  . azithromycin (ZITHROMAX) 250 MG tablet Take 250-500 mg by mouth as directed. Take 2 tablets on Day 1. Take 1 tablet daily for Days 2-5. Alternate Every Other Month With Cefdinir 6 tablet 2  . calcium carbonate (OS-CAL) 1250 (500 Ca) MG chewable tablet Chew by mouth.    .  calcium carbonate (TUMS - DOSED IN MG ELEMENTAL CALCIUM) 500 MG chewable tablet Chew 1 tablet by mouth every 8 (eight) hours as needed for indigestion or heartburn.    . carvedilol (COREG) 6.25 MG tablet TAKE 1 TABLET(6.25 MG) BY MOUTH TWICE DAILY 180 tablet 2  . cefdinir (OMNICEF) 300 MG capsule 1 capsule    . clopidogrel (PLAVIX) 75 MG tablet TAKE 1 TABLET(75 MG) BY MOUTH DAILY 30 tablet 7  . DULoxetine (CYMBALTA) 20 MG capsule Take 20 mg by mouth 3 (three) times daily.    . Elastic Bandages & Supports (JOBST ACTIVE 20-30MMHG MEDIUM) MISC See admin instructions.    . escitalopram (LEXAPRO) 5 MG tablet Take 5 mg by mouth daily.     . Iron-FA-B Cmp-C-Biot-Probiotic (FUSION PLUS PO) Take 1 tablet by mouth daily.    . latanoprost (XALATAN) 0.005 % ophthalmic solution 1 drop    . levothyroxine (SYNTHROID, LEVOTHROID) 50 MCG tablet Take 50 mcg by mouth daily.      . loratadine (CLARITIN) 10 MG tablet Take 10 mg by mouth daily.    . mupirocin ointment (BACTROBAN) 2 % Apply to affected digits once daily. 30 g 1  . NONFORMULARY OR COMPOUNDED ITEM [facility]:  Neuropathy Cream #11 - Bupivacaine 1%, Doxepin 3%, Gabapentin 6%, Pentoxifylline 3%, Topiramate 1%. Apply 1-2 grams to affected areas 3-4 times a day. 100 each 5  . pantoprazole (PROTONIX) 40 MG tablet Take 40 mg by mouth 2 (two) times daily.    . predniSONE (DELTASONE) 10 MG tablet TAKE 1 TABLET(10 MG) BY MOUTH DAILY WITH BREAKFAST 30 tablet 5  . ROCKLATAN 0.02-0.005 % SOLN Apply 1 drop to eye at bedtime.    . Sennosides (SENOKOT PO) Take 1 tablet by mouth daily as needed (constipation).     . SIMBRINZA 1-0.2 % SUSP Place 1 drop into the left eye in the morning, at noon, and at bedtime.      Review of systems: Patient denies loss of consciousness, nausea, diarrhea No fever or chills.  No cough.  Physical Exam: Vitals:   01/21/20 1454 01/21/20 1630  BP: (!) 165/94 (!) 149/93  Pulse: 94 95  Resp: (!) 26 (!) 23  Temp: (!) 97.4 F  (36.3 C)   SpO2: 96% 95%   Body mass index is 20.6 kg/m.  Patient is alert and oriented x3.  Patient is hard of hearing and uses hearing aids. No shortness of breath or chest pain at present. Lungs clear to auscultation bilaterally Cardiac: Regular rate and rhythm no rubs gallops murmurs Full range of motion in the upper extremity bilaterally with no significant shoulder, elbow, wrist pain. Abdomen is soft and nontender.  No rebound tenderness Significant pain in the right pelvis/groin.  Pain with gentle attempts at range of motion of the hip.  No significant   swelling or pain at the knee or ankle. Left lower extremity: Asymptomatic.  Full range of motion at the hip, knee, ankle with no significant pain or deformity. 1+ dorsalis pedis/posterior tibialis pulses bilaterally.  Compartments are soft and nontender bilaterally.  Image: DG Chest Port 1 View  Result Date: 01/21/2020 CLINICAL DATA:  Right hip fracture following a fall. Click EXAM: PORTABLE CHEST 1 VIEW COMPARISON:  07/30/2019.  Chest CTA dated 07/02/2019. FINDINGS: Normal sized heart. Stable moderate-sized hiatal hernia. Scarring in the left mid to lower lung zone without significant change. Stable small irregular nodule in the right upper lobe. This area was obscured by multiple opacities in the right upper lobe on 07/02/2019, including multiple nodular opacities. No fracture or pneumothorax seen. IMPRESSION: 1. No fracture or pneumothorax. 2. Stable irregular nodule in the right upper lobe. 3. Stable moderate-sized hiatal hernia. Electronically Signed   By: Steven  Reid M.D.   On: 01/21/2020 14:53   DG Hip Unilat With Pelvis 2-3 Views Right  Result Date: 01/21/2020 CLINICAL DATA:  Right hip pain following a fall. EXAM: DG HIP (WITH OR WITHOUT PELVIS) 2-3V RIGHT COMPARISON:  None. FINDINGS: Right femoral neck fracture with proximal displacement and varus angulation of the distal fragment IMPRESSION: Right femoral neck fracture.  Electronically Signed   By: Steven  Reid M.D.   On: 01/21/2020 14:48   DG FEMUR, MIN 2 VIEWS RIGHT  Result Date: 01/21/2020 CLINICAL DATA:  Right hip pain following a fall. EXAM: RIGHT FEMUR 2 VIEWS COMPARISON:  Right hip radiographs obtained at the same time. FINDINGS: Previously described right femoral neck fracture. No additional fractures or dislocations. IMPRESSION: Previously described right femoral neck fracture. Electronically Signed   By: Steven  Reid M.D.   On: 01/21/2020 14:49    A/P: Sarah [name] is a very pleasant 87-year-old woman who is on Plavix and is hard of hearing who presents after mechanical fall with right hip pain.  Imaging studies confirm a right femoral neck fracture.  On clinical exam she is neurologically intact, compartments are soft and nontender, and she has intact peripheral pulses.  She denies any other extremity pain.  I have discussed the case with Dr. Swinteck and he will plan on moving forward with surgical management of the hip fracture on Sunday.  Patient can continue on her Plavix.  We will move forward with a hemiarthroplasty.  Patient will be admitted to the medical service.  Recommend Tylenol or very low-dose narcotic (tramadol) for pain control to limit confusion. 

## 2020-01-21 NOTE — ED Provider Notes (Signed)
[facility] DEPT Provider Note   CSN: [id number] Arrival date & time: 01/21/20  1258     History Chief Complaint  Patient presents with  . Hip Pain    Chaniya Genter is a 87 y.o. female.  HPI Patient presents after mechanical fall at her nursing facility.  She is awake, alert, providing her own history. Additional details are provided by EMS providers who bring the patient here for evaluation. Patient recalls falling, as her typical safety needs are not where she dissipated.  She has been about 2 hours on the ground.  Since that time she has had pain in the right proximal and mid thigh.  No ambulatory capacity.  Pain improved with 50 mcg of fentanyl.  Though she is on Plavix she had no head trauma, denies confusion, new weakness, denies any head pain, neck pain.    Past Medical History:  Diagnosis Date  . Allergic rhinitis   . Anxiety    Anxiety attacks  . Arthritis    "a little; in my back and hands" (04/15/2012)  . Asthma   . Bronchiectasis    with history of Legionaires with chronic rales/rhonchi  . Chest pain at rest   . Daily headache    "over the last week" 04/15/2012   . Depression   . Diastolic dysfunction    a. per echo 08/2010 with normal LV function;  b. 06/2011 Echo: EF 65-70%  . GERD (gastroesophageal reflux disease)    "just recently" (04/15/2012)  . H/O hiatal hernia   . H/O Legionnaire's disease 11/1976  . History of blood transfusion 1972   "w/hysterectomy" (04/15/2012)  . Hypercholesteremia   . Hypertension    Negative renal duplex in December of 2012  . Hypothyroidism   . Idiopathic peripheral neuropathy   . MAC (mycobacterium avium-intracellulare complex)   . Migraines    "outgrew them" (04/15/2012)  . NSTEMI (non-ST elevated myocardial infarction) ([place])    a. Normal coronaries per cath August 2012 and negative CT angio for PE;  b. 06/2011 Repeat admission w/ chest pain and elevated troponin's, CTA Chest w/o PE  . Osteopenia  02/2011   t score - 2.1  . Pneumonia    "recurrent" (04/15/2012)  . Pollen allergies   . Shortness of breath    "sometimes just lying down" (04/15/2012)  . Stroke [facility]) 06/2014   cerebellum    Patient Active Problem List   Diagnosis Date Noted  . Acute non-ST segment elevation myocardial infarction ([place]) 01/05/2020  . Debility 01/05/2020  . Age-related physical debility 01/05/2020  . Aneurysm of carotid artery ([place]) 01/05/2020  . Bilateral hearing loss 01/05/2020  . Bronchiolectasis ([place]) 01/05/2020  . Chronic kidney disease, stage 3 unspecified ([place]) 01/05/2020  . Deficiency of macronutrients 01/05/2020  . Protein calorie malnutrition ([place]) 01/05/2020  . Diaphragmatic hernia 01/05/2020  . Dysphagia 01/05/2020  . Edema of lower extremity 01/05/2020  . Generalized anxiety disorder 01/05/2020  . Gingivostomatitis 01/05/2020  . Hyperglycemia 01/05/2020  . Hypertensive retinopathy 01/05/2020  . Impulse control disorder 01/05/2020  . Trichotillomania 01/05/2020  . Iron deficiency anemia 01/05/2020  . Lung mass 01/05/2020  . Lymphedema 01/05/2020  . Macular degeneration 01/05/2020  . Major depression single episode, in partial remission ([place]) 01/05/2020  . Migraine 01/05/2020  . Mycobacteriosis 01/05/2020  . Other specified disorders of bone density and structure, other site 01/05/2020  . Prediabetes 01/05/2020  . Recurrent major depression ([place]) 01/05/2020  . Retinal drusen 01/05/2020  . Rosacea 01/05/2020  .  Sciatica 01/05/2020  . Senile purpura ([place]) 01/05/2020  . Tear film insufficiency 01/05/2020  . Vitamin D deficiency 01/05/2020  . Pseudomonas aeruginosa colonization 07/23/2019  . Aspiration pneumonia of both lungs ([place]) 07/23/2019  . Acute respiratory failure with hypoxia ([place]) 07/02/2019  . History of CVA (cerebrovascular accident) 07/02/2019  . CAD (coronary artery disease) 07/02/2019  . Glaucoma 07/02/2019  . S/P thoracentesis   . Pleural effusion   . Pneumonia  of left lung due to infectious organism 12/16/2018  . Community acquired pneumonia 12/16/2018  . HCAP (healthcare-associated pneumonia) 12/16/2018  . Leukocytosis   . Hemoptysis 03/16/2018  . GERD (gastroesophageal reflux disease) 03/19/2017  . Nausea vomiting and diarrhea   . Gastroenteritis due to norovirus 01/25/2017  . Adrenal insufficiency ([place]) 03/21/2015  . HTN (hypertension) 02/14/2015  . Fecal impaction ([place]) 10/07/2014  . Colitis 10/06/2014  . Cerebral infarction due to thrombosis of basilar artery ([place]) 09/09/2014  . Aneurysm, cerebral, nonruptured 09/09/2014  . HLD (hyperlipidemia) 09/09/2014  . Acute ischemic stroke ([place]) 07/09/2014  . Chronic diastolic CHF (congestive heart failure) ([place]) 07/09/2014  . Stroke ([place]) 07/08/2014  . TIA (transient ischemic attack) 07/08/2014  . Chest pain 04/15/2012  . Shortness of breath dyspnea 11/23/2011  . Anxiety 11/23/2011  . Malignant hypertension 11/23/2011  . Elevated troponin 06/20/2011  . Depression   . Hypothyroidism   . Arthritis   . Racing heart beat 02/11/2011  . NSTEMI (non-ST elevated myocardial infarction) ([place]) 09/24/2010  . Idiopathic peripheral neuropathy   . Pollen allergies   . BRONCHIECTASIS 07/29/2008  . Essential hypertension 06/15/2008  . Allergic rhinitis 06/15/2008  . Asthma 06/15/2008  . Cough 06/15/2008    Past Surgical History:  Procedure Laterality Date  . APPENDECTOMY    . CARDIAC CATHETERIZATION  August 2012   Normal coronaries.  Marland Kitchen DILATION AND CURETTAGE OF UTERUS    . TONSILLECTOMY  1930  . TOTAL ABDOMINAL HYSTERECTOMY W/ BILATERAL SALPINGOOPHORECTOMY  1972   leiomyomata, menorrhagia     OB History    Gravida  2   Para  2   Term      Preterm      AB      Living  2     SAB      IAB      Ectopic      Multiple      Live Births              Family History  Problem Relation Age of Onset  . Hypertension Mother        stroke  . Stroke Mother   . Cancer Brother         prostate  . Osteoarthritis Paternal Aunt     Social History   Tobacco Use  . Smoking status: Never Smoker  . Smokeless tobacco: Never Used  Vaping Use  . Vaping Use: Never used  Substance Use Topics  . Alcohol use: Not Currently  . Drug use: No    Home Medications Prior to Admission medications   Medication Sig Start Date End Date Taking? Authorizing Provider  acetaminophen (TYLENOL) 500 MG tablet Take 500 mg by mouth every 6 (six) hours as needed for moderate pain.    [provider]  albuterol (PROVENTIL) (2.5 MG/3ML) 0.083% nebulizer solution Take 3 mLs (2.5 mg total) by nebulization in the morning and at bedtime. DX: J47.9 10/11/19   Byrum, Rose Fillers, MD  ALPHAGAN P 0.1 % SOLN Place 1 drop into the left  eye every 8 (eight) hours.  11/26/18   [provider]  ALPRAZolam Duanne Moron) 0.5 MG tablet Take 1 tablet by mouth as directed. Take 1 tablet (0.5 mg) BID at (8 am & 8 pm) & DAILY PRN FOR ANXIETY 04/06/18   [name], MD  amLODipine (NORVASC) 10 MG tablet Take 0.5 tablets (5 mg total) by mouth daily as needed. May take an additional 0.5 mg tablet as needed for persistent elevated BP 01/04/19   Nahser, Wonda Cheng, MD  azithromycin (ZITHROMAX) 250 MG tablet Take 250-500 mg by mouth as directed. Take 2 tablets on Day 1. Take 1 tablet daily for Days 2-5. Alternate Every Other Month With Cefdinir 12/27/19   Collene Gobble, MD  calcium carbonate (OS-CAL) 1250 (500 Ca) MG chewable tablet Chew by mouth.    [provider]  calcium carbonate (TUMS - DOSED IN MG ELEMENTAL CALCIUM) 500 MG chewable tablet Chew 1 tablet by mouth every 8 (eight) hours as needed for indigestion or heartburn.    [provider]  carvedilol (COREG) 6.25 MG tablet TAKE 1 TABLET(6.25 MG) BY MOUTH TWICE DAILY 12/15/19   Nahser, Wonda Cheng, MD  cefdinir (OMNICEF) 300 MG capsule 1 capsule    [provider]  clopidogrel (PLAVIX) 75 MG tablet TAKE 1 TABLET(75 MG) BY MOUTH  DAILY 10/08/18   Nahser, Wonda Cheng, MD  DULoxetine (CYMBALTA) 20 MG capsule Take 20 mg by mouth 3 (three) times daily. 06/14/14   [provider]  Elastic Bandages & Supports ([place] 20-30MMHG MEDIUM) MISC See admin instructions. 11/15/18   [provider]  escitalopram (LEXAPRO) 5 MG tablet Take 5 mg by mouth daily.     [provider]  Iron-FA-B Cmp-C-Biot-Probiotic (FUSION PLUS PO) Take 1 tablet by mouth daily.    [provider]  latanoprost (XALATAN) 0.005 % ophthalmic solution 1 drop 10/11/14   [provider]  levothyroxine (SYNTHROID, LEVOTHROID) 50 MCG tablet Take 50 mcg by mouth daily.      [provider]  loratadine (CLARITIN) 10 MG tablet Take 10 mg by mouth daily.    [provider]  mupirocin ointment (BACTROBAN) 2 % Apply to affected digits once daily. 01/05/20   Marzetta Board, DPM  NONFORMULARY OR COMPOUNDED ITEM [facility]:  Neuropathy Cream #11 - Bupivacaine 1%, Doxepin 3%, Gabapentin 6%, Pentoxifylline 3%, Topiramate 1%. Apply 1-2 grams to affected areas 3-4 times a day. 07/01/19   Marzetta Board, DPM  pantoprazole (PROTONIX) 40 MG tablet Take 40 mg by mouth 2 (two) times daily.    [provider]  predniSONE (DELTASONE) 10 MG tablet TAKE 1 TABLET(10 MG) BY MOUTH DAILY WITH BREAKFAST 11/24/19   Byrum, Rose Fillers, MD  ROCKLATAN 0.02-0.005 % SOLN Apply 1 drop to eye at bedtime. 04/07/19   [provider]  Sennosides (SENOKOT PO) Take 1 tablet by mouth daily as needed (constipation).     [provider]  SIMBRINZA 1-0.2 % SUSP Place 1 drop into the left eye in the morning, at noon, and at bedtime.  04/07/19   [provider]    Allergies    Codeine, Hydrocodone, Isosorbide, Isosorbide nitrate, Oxycodone, Clarithromycin, Fiorinal [butalbital-aspirin-caffeine], Levofloxacin, Augmentin [amoxicillin-pot clavulanate], and Doxycycline  Review of Systems   Review of  Systems  Constitutional:       Per HPI, otherwise negative  HENT:       Per HPI, otherwise negative  Respiratory:       Per HPI, otherwise negative  Cardiovascular:       Per HPI, otherwise negative  Gastrointestinal: Negative for vomiting.  Endocrine:       Negative aside from HPI  Genitourinary:       Neg aside from HPI   Musculoskeletal:       Per HPI, otherwise negative  Skin: Negative.   Neurological: Negative for syncope.  Hematological:       On plavix    Physical Exam Updated Vital Signs BP 115/66   Pulse 86   Temp (!) 97.4 F (36.3 C) (Oral)   Resp 18   Ht _0  (1.626 m)   Wt 54.4 kg   LMP 01/14/1970   SpO2 95%   BMI 20.60 kg/m   Physical Exam Vitals and nursing note reviewed.  Constitutional:      General: She is not in acute distress.    Appearance: She is well-developed and well-nourished.  HENT:     Head: Normocephalic and atraumatic.  Eyes:     Extraocular Movements: EOM normal.     Conjunctiva/sclera: Conjunctivae normal.  Cardiovascular:     Rate and Rhythm: Normal rate and regular rhythm.  Pulmonary:     Effort: Pulmonary effort is normal. No respiratory distress.     Breath sounds: Normal breath sounds. No stridor.  Abdominal:     General: There is no distension.  Musculoskeletal:        General: No edema.     Right hip: Tenderness and bony tenderness present. Decreased range of motion.     Left hip: Normal.     Right knee: Normal.     Left knee: Normal.       Legs:  Skin:    General: Skin is warm and dry.  Neurological:     Mental Status: She is alert and oriented to person, place, and time.     Cranial Nerves: No cranial nerve deficit.  Psychiatric:        Mood and Affect: Mood and affect normal.      ED Results / Procedures / Treatments   Labs (all labs ordered are listed, but only abnormal results are displayed) Labs Reviewed  BASIC METABOLIC PANEL - Abnormal; Notable for the following components:      Result Value    Chloride 95 (*)    Glucose, Bld 153 (*)    Creatinine, Ser 1.03 (*)    Calcium 8.7 (*)    GFR, Estimated 50 (*)    All other components within normal limits  CBC WITH DIFFERENTIAL/PLATELET - Abnormal; Notable for the following components:   WBC 15.4 (*)    Hemoglobin 16.4 (*)    HCT 52.7 (*)    MCV 103.1 (*)    Neutro Abs 14.1 (*)    Abs Immature Granulocytes 0.10 (*)    All other components within normal limits  SARS CORONAVIRUS 2 (TAT 6-24 HRS)  URINE CULTURE  PROTIME-INR  URINALYSIS, ROUTINE W REFLEX MICROSCOPIC  TYPE AND SCREEN  ABO/RH    EKG EKG Interpretation  Date/Time:  Friday January 21 2020 13:25:38 EST Ventricular Rate:  95 PR Interval:    QRS Duration: 110 QT Interval:  365 QTC Calculation: 459 R Axis:   -37 Text Interpretation: Sinus rhythm Left axis deviation RSR' in V1 or V2, probably normal variant Nonspecific T abnormalities, lateral leads Artifact Abnormal ECG Confirmed by Carmin Muskrat [id number]) on 01/21/2020 1:29:23 PM   Radiology DG Chest Port 1 View  Result Date: 01/21/2020 CLINICAL DATA:  Right hip  fracture following a fall. Click EXAM: PORTABLE CHEST 1 VIEW COMPARISON:  07/30/2019.  Chest CTA dated 07/02/2019. FINDINGS: Normal sized heart. Stable moderate-sized hiatal hernia. Scarring in the left mid to lower lung zone without significant change. Stable small irregular nodule in the right upper lobe. This area was obscured by multiple opacities in the right upper lobe on 07/02/2019, including multiple nodular opacities. No fracture or pneumothorax seen. IMPRESSION: 1. No fracture or pneumothorax. 2. Stable irregular nodule in the right upper lobe. 3. Stable moderate-sized hiatal hernia. Electronically Signed   By: Claudie Revering M.D.   On: 01/21/2020 14:53   DG Hip Unilat With Pelvis 2-3 Views Right  Result Date: 01/21/2020 CLINICAL DATA:  Right hip pain following a fall. EXAM: DG HIP (WITH OR WITHOUT PELVIS) 2-3V RIGHT COMPARISON:  None. FINDINGS: Right  femoral neck fracture with proximal displacement and varus angulation of the distal fragment IMPRESSION: Right femoral neck fracture. Electronically Signed   By: Claudie Revering M.D.   On: 01/21/2020 14:48   DG FEMUR, MIN 2 VIEWS RIGHT  Result Date: 01/21/2020 CLINICAL DATA:  Right hip pain following a fall. EXAM: RIGHT FEMUR 2 VIEWS COMPARISON:  Right hip radiographs obtained at the same time. FINDINGS: Previously described right femoral neck fracture. No additional fractures or dislocations. IMPRESSION: Previously described right femoral neck fracture. Electronically Signed   By: Claudie Revering M.D.   On: 01/21/2020 14:49    Procedures Procedures (including critical care time)  Medications Ordered in ED Medications  fentaNYL (SUBLIMAZE) injection 50 mcg (50 mcg Intravenous Given 01/21/20 1545)    ED Course  I have reviewed the triage vital signs and the nursing notes.  Pertinent labs & imaging results that were available during my care of the patient were reviewed by me and considered in my medical decision making (see chart for details). With immediate consideration of hip fracture following the patient's mechanical fall patient had appropriate labs, x-ray ordered. She was placed on continuous pulse oximetry, monitoring, though she did describe no prodrome prior to the fall, had no chest pain before or afterwards. Initial pulse oximetry was 95%, that she intermittently dropped to 90%, was placed on 2 L, and subsequently an improvement into the upper 90% range. Cardiac 80s sinus unremarkable  Update: Patient accompanied by her daughter. She and I discussed the patient's history, most notable for history of COPD, with close follow-up with pulmonology. The patient affirms that she had been in her usual state of health, again. We discussed findings thus far, notable for evidence for right femoral neck fracture. I have reviewed this x-ray, discussed it with our orthopedic colleagues, plan is for  surgery in 2 days.   5:43 PM I discussed the patient's case with our internal medicine colleague, Dr. Grandville Silos for admission.  Elderly female with multiple medical issues most notably bronchiectasis presents with right hip pain following a mechanical fall apparently she is awake, alert, speaking clearly.  Findings most notable for femoral neck fracture requiring admission for further monitoring, management.  UA and COVID test pending on admission. Final Clinical Impression(s) / ED Diagnoses Final diagnoses:  Fall, initial encounter  Closed fracture of neck of right femur, initial encounter ([place])   MDM Number of Diagnoses or Management Options Closed fracture of neck of right femur, initial encounter [facility]): new, needed workup Fall, initial encounter: new, needed workup   Amount and/or Complexity of Data Reviewed Clinical lab tests: reviewed Tests in the radiology section of CPT: reviewed Tests in the medicine  section of CPT: reviewed Decide to obtain previous medical records or to obtain history from someone other than the patient: yes Obtain history from someone other than the patient: yes Review and summarize past medical records: yes Discuss the patient with other providers: yes Independent visualization of images, tracings, or specimens: yes  Risk of Complications, Morbidity, and/or Mortality Presenting problems: high Diagnostic procedures: high Management options: high  Critical Care Total time providing critical care: < 30 minutes  Patient Progress Patient progress: stable    Carmin Muskrat, MD 01/21/20 1746

## 2020-01-21 NOTE — Anesthesia Preprocedure Evaluation (Addendum)
Anesthesia Evaluation  Patient identified by MRN, date of birth, ID band Patient awake    Reviewed: Allergy & Precautions, NPO status , Patient's Chart, lab work & pertinent test results  Airway Mallampati: III  TM Distance: >3 FB Neck ROM: Limited    Dental  (+) Partial Upper, Partial Lower   Pulmonary shortness of breath, asthma ,  bronchiectasis   + rhonchi        Cardiovascular hypertension, + CAD, + Past MI (on Plavix) and + Peripheral Vascular Disease  Normal cardiovascular exam Rhythm:Regular Rate:Normal     Neuro/Psych  Headaches, PSYCHIATRIC DISORDERS Anxiety Depression  Neuromuscular disease (peripheral neuropathy; sciatica) CVA (on plavix)    GI/Hepatic hiatal hernia, GERD  ,  Endo/Other  Hypothyroidism   Renal/GU Renal InsufficiencyRenal disease     Musculoskeletal  (+) Arthritis ,   Abdominal Normal abdominal exam  (+)   Peds  Hematology   Anesthesia Other Findings Skin tear on right arm from hanging tape. Will remove the rest of the tap and clean/dress in the OR.   Reproductive/Obstetrics                          Anesthesia Physical Anesthesia Plan  ASA: IV and emergent  Anesthesia Plan: General   Post-op Pain Management:    Induction: Intravenous  PONV Risk Score and Plan: 3 and TIVA and Ondansetron  Airway Management Planned: Oral ETT and Video Laryngoscope Planned  Additional Equipment:   Intra-op Plan:   Post-operative Plan: Extubation in OR and Possible Post-op intubation/ventilation  Informed Consent: I have reviewed the patients History and Physical, chart, labs and discussed the procedure including the risks, benefits and alternatives for the proposed anesthesia with the patient or authorized representative who has indicated his/her understanding and acceptance.   Patient has DNR.  Discussed DNR with patient, Discussed DNR with power of attorney and Continue  DNR.     Plan Discussed with: CRNA, Anesthesiologist and Surgeon  Anesthesia Plan Comments: (On Plavix at admission and continued per primary team/ortho recommendation on admission.. Discussed DNR with patient and daughter/HCPOA. Plan is to keep DNR in place. No chest compressions/defibrrillation. IV meds OK. Both understand that patient requires intubation for the procedure and that there is risk of needing to remain intubated postoperatively given extensive pulmonary history. GETA. Existing IV. Tanna Furry, MD  )     Anesthesia Quick Evaluation

## 2020-01-22 ENCOUNTER — Inpatient Hospital Stay (HOSPITAL_COMMUNITY): Payer: Medicare Other

## 2020-01-22 DIAGNOSIS — F419 Anxiety disorder, unspecified: Secondary | ICD-10-CM | POA: Diagnosis not present

## 2020-01-22 DIAGNOSIS — R0781 Pleurodynia: Secondary | ICD-10-CM | POA: Insufficient documentation

## 2020-01-22 DIAGNOSIS — R54 Age-related physical debility: Secondary | ICD-10-CM

## 2020-01-22 DIAGNOSIS — G459 Transient cerebral ischemic attack, unspecified: Secondary | ICD-10-CM

## 2020-01-22 DIAGNOSIS — J479 Bronchiectasis, uncomplicated: Secondary | ICD-10-CM | POA: Diagnosis not present

## 2020-01-22 DIAGNOSIS — S72001A Fracture of unspecified part of neck of right femur, initial encounter for closed fracture: Secondary | ICD-10-CM | POA: Diagnosis not present

## 2020-01-22 LAB — CBC
HCT: 45.2 % (ref 36.0–46.0)
Hemoglobin: 14.3 g/dL (ref 12.0–15.0)
MCH: 32.9 pg (ref 26.0–34.0)
MCHC: 31.6 g/dL (ref 30.0–36.0)
MCV: 103.9 fL — ABNORMAL HIGH (ref 80.0–100.0)
Platelets: 248 10*3/uL (ref 150–400)
RBC: 4.35 MIL/uL (ref 3.87–5.11)
RDW: 12.1 % (ref 11.5–15.5)
WBC: 11.7 10*3/uL — ABNORMAL HIGH (ref 4.0–10.5)
nRBC: 0 % (ref 0.0–0.2)

## 2020-01-22 LAB — BASIC METABOLIC PANEL
Anion gap: 10 (ref 5–15)
BUN: 14 mg/dL (ref 8–23)
CO2: 29 mmol/L (ref 22–32)
Calcium: 8.4 mg/dL — ABNORMAL LOW (ref 8.9–10.3)
Chloride: 99 mmol/L (ref 98–111)
Creatinine, Ser: 0.89 mg/dL (ref 0.44–1.00)
GFR, Estimated: 59 mL/min — ABNORMAL LOW (ref >60)
Glucose, Bld: 91 mg/dL (ref 70–99)
Potassium: 4.4 mmol/L (ref 3.5–5.1)
Sodium: 138 mmol/L (ref 135–145)

## 2020-01-22 LAB — VITAMIN D 25 HYDROXY (VIT D DEFICIENCY, FRACTURES): Vit D, 25-Hydroxy: 33.97 ng/mL (ref 30–100)

## 2020-01-22 LAB — ALBUMIN: Albumin: 3.4 g/dL — ABNORMAL LOW (ref 3.5–5.0)

## 2020-01-22 MED ORDER — BRINZOLAMIDE-BRIMONIDINE 1-0.2 % OP SUSP
1.0000 [drp] | Freq: Three times a day (TID) | OPHTHALMIC | Status: DC
  Administered 2020-01-22 – 2020-01-27 (×15): 1 [drp] via OPHTHALMIC

## 2020-01-22 MED ORDER — NETARSUDIL-LATANOPROST 0.02-0.005 % OP SOLN: 1.0000 [drp] | Freq: Every day | OPHTHALMIC | Status: DC

## 2020-01-22 MED ORDER — GUAIFENESIN ER 600 MG PO TB12
1200.0000 mg | ORAL_TABLET | Freq: Two times a day (BID) | ORAL | Status: DC
  Administered 2020-01-22 – 2020-01-27 (×10): 1200 mg via ORAL
  Filled 2020-01-22 (×10): qty 2

## 2020-01-22 MED ORDER — FENTANYL CITRATE (PF) 100 MCG/2ML IJ SOLN
50.0000 ug | INTRAMUSCULAR | Status: DC | PRN
  Filled 2020-01-22: qty 2

## 2020-01-22 MED ORDER — TRAMADOL HCL 50 MG PO TABS
100.0000 mg | ORAL_TABLET | Freq: Four times a day (QID) | ORAL | Status: DC | PRN
  Administered 2020-01-22 – 2020-01-24 (×3): 100 mg via ORAL
  Filled 2020-01-22 (×3): qty 2

## 2020-01-22 NOTE — Progress Notes (Signed)
Flutter valve not given due to product on back order.  

## 2020-01-22 NOTE — ED Notes (Signed)
Patient being transported to XR at this time.   

## 2020-01-22 NOTE — Progress Notes (Signed)
Sarah [name]  MRN: [id number] DOB/Age: 20-Jul-1921 87 y.o. [facility] Procedure:      Subjective: Seen in ER, spoke with daughter. Just arrived back from xray of ribs  Vital Signs Temp:  [97.4 F (36.3 C)-97.7 F (36.5 C)] 97.4 F (36.3 C) (01/07 1454) Pulse Rate:  [70-102] 102 (01/08 0930) Resp:  [13-31] 31 (01/08 0930) BP: (110-175)/(62-157) 142/84 (01/08 0930) SpO2:  [86 %-98 %] 91 % (01/08 0930) Weight:  [54.4 kg] 54.4 kg (01/07 1454)  Lab Results Recent Labs    01/21/20 1338 01/22/20 0423  WBC 15.4* 11.7*  HGB 16.4* 14.3  HCT 52.7* 45.2  PLT 277 248   BMET Recent Labs    01/21/20 1338 01/22/20 0423  NA 136 138  K 4.8 4.4  CL 95* 99  CO2 32 29  GLUCOSE 153* 91  BUN 14 14  CREATININE 1.03* 0.89  CALCIUM 8.7* 8.4*   INR  Date Value Ref Range Status  01/21/2020 0.9 0.8 - 1.2 Final    Comment:    (NOTE) INR goal varies based on device and disease states. Performed at [facility], [address] [place], [place] [address]      Exam Right LE NVI Shortened and externally rotated        Plan For OR tomorrow  Cont prn analgesics  Ralene Bathe PA-C  01/22/2020, 10:12 AM Contact # [phone]

## 2020-01-22 NOTE — ED Notes (Signed)
Dr Janee Morn at bedside speaking with patient.

## 2020-01-22 NOTE — ED Notes (Signed)
ED TO INPATIENT HANDOFF REPORT  ED Nurse Name and Phone #: [phone]  S Name/Age/Gender Sarah [name] 87 y.o. female Room/Bed: WA11/WA11  Code Status   Code Status: DNR  Home/SNF/Other Nursing Home Patient oriented to: self, place, time and situation Is this baseline? Yes   Triage Complete: Triage complete  Chief Complaint Closed displaced fracture of right femoral neck ([place]) [S72.001A]  Triage Note [facility] transferred pt from [place] [facility] and reports the following:  Unwitnessed fall. Pt on floor for 2 hours. Shortening and rotation to right foot. Did not hit head. On Plavix. Pt daughter was at [employer] and at [employer]. Daughter said, pt has had reactions to codeine and oxy. EMS gave 50 mcg fentanyl around 12:40 PM. Hx COPD. oxygen was mid to upper 80s upon EMS arrival. Daughter said baseline O2 low 90s. AOx4. DNR at bedside. Pt walks with cane and walker at facility.    Allergies Allergies  Allergen Reactions  . Codeine Anaphylaxis  . Hydrocodone Nausea And Vomiting and Other (See Comments)    SYNCOPE AND BRADYCARDIA  . Isosorbide Other (See Comments)    BRADYCARDIA  . Isosorbide Nitrate Other (See Comments)    BRADYCARDIA  . Oxycodone Palpitations    Rapid heart beat  . Clarithromycin Nausea And Vomiting    Has patient had a PCN reaction causing immediate rash, facial/tongue/throat swelling, SOB or lightheadedness with hypotension: Unknown Has patient had a PCN reaction causing severe rash involving mucus membranes or skin necrosis: Unknown Has patient had a PCN reaction that required hospitalization: Unknown Has patient had a PCN reaction occurring within the last 10 years: Unknown If all of the above answers are "NO", then may proceed with Cephalosporin use.   . Fiorinal [Butalbital-Aspirin-Caffeine] Swelling and Other (See Comments)    Facial swelling   . Levofloxacin Nausea And Vomiting and Other (See Comments)    SYNCOPE ALSO  . Augmentin  [Amoxicillin-Pot Clavulanate] Diarrhea  . Doxycycline     Sweats and aches    Level of Care/Admitting Diagnosis ED Disposition    ED Disposition Condition [facility]: [facility] [[id number]]  Level of Care: Med-Surg [16]  May admit patient to [facility] or [facility] if equivalent level of care is available:: No  Covid Evaluation: Asymptomatic Screening Protocol (No Symptoms)  Diagnosis: Closed displaced fracture of right femoral neck [facility]) [[id number]]  Admitting Physician: Eugenie Filler [[id number]]  Attending Physician: Eugenie Filler [[id number]]  Estimated length of stay: past midnight tomorrow  Certification:: I certify this patient will need inpatient services for at least 2 midnights       B Medical/Surgery History Past Medical History:  Diagnosis Date  . Allergic rhinitis   . Anxiety    Anxiety attacks  . Arthritis    "a little; in my back and hands" (04/15/2012)  . Asthma   . Bronchiectasis    with history of Legionaires with chronic rales/rhonchi  . Chest pain at rest   . Daily headache    "over the last week" 04/15/2012   . Depression   . Diastolic dysfunction    a. per echo 08/2010 with normal LV function;  b. 06/2011 Echo: EF 65-70%  . GERD (gastroesophageal reflux disease)    "just recently" (04/15/2012)  . H/O hiatal hernia   . H/O Legionnaire's disease 11/1976  . History of blood transfusion 1972   "w/hysterectomy" (04/15/2012)  . Hypercholesteremia   . Hypertension  Negative renal duplex in December of 2012  . Hypothyroidism   . Idiopathic peripheral neuropathy   . MAC (mycobacterium avium-intracellulare complex)   . Migraines    "outgrew them" (04/15/2012)  . NSTEMI (non-ST elevated myocardial infarction) ([place])    a. Normal coronaries per cath August 2012 and negative CT angio for PE;  b. 06/2011 Repeat admission w/ chest pain and elevated troponin's, CTA Chest w/o PE  . Osteopenia 02/2011   t score - 2.1  . Pneumonia     "recurrent" (04/15/2012)  . Pollen allergies   . Shortness of breath    "sometimes just lying down" (04/15/2012)  . Stroke [facility]) 06/2014   cerebellum   Past Surgical History:  Procedure Laterality Date  . APPENDECTOMY    . CARDIAC CATHETERIZATION  August 2012   Normal coronaries.  [facility] Kitchen DILATION AND CURETTAGE OF UTERUS    . TONSILLECTOMY  1930  . TOTAL ABDOMINAL HYSTERECTOMY W/ BILATERAL SALPINGOOPHORECTOMY  1972   leiomyomata, menorrhagia     A IV Location/Drains/Wounds Patient Lines/Drains/Airways Status    Active Line/Drains/Airways    Name Placement date Placement time Site Days   Peripheral IV 01/21/20 Left Wrist 01/21/20  1307  Wrist  1          Intake/Output Last 24 hours No intake or output data in the 24 hours ending 01/22/20 1159  Labs/Imaging Results for orders placed or performed during the hospital encounter of 01/21/20 (from the past 48 hour(s))  Basic metabolic panel     Status: Abnormal   Collection Time: 01/21/20  1:38 PM  Result Value Ref Range   Sodium 136 135 - 145 mmol/L   Potassium 4.8 3.5 - 5.1 mmol/L   Chloride 95 (L) 98 - 111 mmol/L   CO2 32 22 - 32 mmol/L   Glucose, Bld 153 (H) 70 - 99 mg/dL    Comment: Glucose reference range applies only to samples taken after fasting for at least 8 hours.   BUN 14 8 - 23 mg/dL   Creatinine, Ser 1.03 (H) 0.44 - 1.00 mg/dL   Calcium 8.7 (L) 8.9 - 10.3 mg/dL   GFR, Estimated 50 (L) >60 mL/min    Comment: (NOTE) Calculated using the CKD-EPI Creatinine Equation (2021)    Anion gap 9 5 - 15    Comment: Performed at [facility], [place] [address] [place], [place] [address]  CBC WITH DIFFERENTIAL     Status: Abnormal   Collection Time: 01/21/20  1:38 PM  Result Value Ref Range   WBC 15.4 (H) 4.0 - 10.5 K/uL   RBC 5.11 3.87 - 5.11 MIL/uL   Hemoglobin 16.4 (H) 12.0 - 15.0 g/dL   HCT 52.7 (H) 36.0 - 46.0 %   MCV 103.1 (H) 80.0 - 100.0 fL   MCH 32.1 26.0 - 34.0 pg   MCHC 31.1 30.0 - 36.0 g/dL    RDW 12.0 11.5 - 15.5 %   Platelets 277 150 - 400 K/uL   nRBC 0.0 0.0 - 0.2 %   Neutrophils Relative % 91 %   Neutro Abs 14.1 (H) 1.7 - 7.7 K/uL   Lymphocytes Relative 5 %   Lymphs Abs 0.7 0.7 - 4.0 K/uL   Monocytes Relative 3 %   Monocytes Absolute 0.4 0.1 - 1.0 K/uL   Eosinophils Relative 0 %   Eosinophils Absolute 0.0 0.0 - 0.5 K/uL   Basophils Relative 0 %   Basophils Absolute 0.1 0.0 - 0.1 K/uL   Immature Granulocytes  1 %   Abs Immature Granulocytes 0.10 (H) 0.00 - 0.07 K/uL    Comment: Performed at [facility], [place] [address], [place], [place] [address]  Protime-INR     Status: None   Collection Time: 01/21/20  1:38 PM  Result Value Ref Range   Prothrombin Time 11.8 11.4 - 15.2 seconds   INR 0.9 0.8 - 1.2    Comment: (NOTE) INR goal varies based on device and disease states. Performed at [facility], [place] [address] [place], [place] [address]   Type and screen [place]     Status: None   Collection Time: 01/21/20  1:38 PM  Result Value Ref Range   ABO/RH(D) O NEG    Antibody Screen NEG    Sample Expiration      01/24/2020,2359 Performed at [facility], [place] [address] [place], [place] [address]   SARS CORONAVIRUS 2 (TAT 6-24 HRS) Nasopharyngeal Nasopharyngeal Swab     Status: None   Collection Time: 01/21/20  1:38 PM   Specimen: Nasopharyngeal Swab  Result Value Ref Range   SARS Coronavirus 2 NEGATIVE NEGATIVE    Comment: (NOTE) SARS-CoV-2 target nucleic acids are NOT DETECTED.  The SARS-CoV-2 RNA is generally detectable in upper and lower respiratory specimens during the acute phase of infection. Negative results do not preclude SARS-CoV-2 infection, do not rule out co-infections with other pathogens, and should not be used as the sole basis for treatment or other patient management decisions. Negative results must be combined with clinical observations, patient history, and  epidemiological information. The expected result is Negative.  Fact Sheet for Patients: SugarRoll.be  Fact Sheet for Healthcare Providers: https://www.woods-mathews.com/  This test is not yet approved or cleared by the [place] FDA and  has been authorized for detection and/or diagnosis of SARS-CoV-2 by FDA under an Emergency Use Authorization (EUA). This EUA will remain  in effect (meaning this test can be used) for the duration of the COVID-19 declaration under Se ction 564(b)(1) of the Act, 21 U.S.C. section 360bbb-3(b)(1), unless the authorization is terminated or revoked sooner.  Performed at [facility], [place] [address], [place], [place] [address]   ABO/Rh     Status: None   Collection Time: 01/21/20  2:34 PM  Result Value Ref Range   ABO/RH(D)      Jenetta Downer NEG Performed at [place] [address], [place], [place] [address]   Urinalysis, Routine w reflex microscopic Urine, Catheterized     Status: Abnormal   Collection Time: 01/21/20  6:34 PM  Result Value Ref Range   Color, Urine YELLOW YELLOW   APPearance CLOUDY (A) CLEAR   Specific Gravity, Urine 1.010 1.005 - 1.030   pH 8.0 5.0 - 8.0   Glucose, UA NEGATIVE NEGATIVE mg/dL   Hgb urine dipstick NEGATIVE NEGATIVE   Bilirubin Urine NEGATIVE NEGATIVE   Ketones, ur NEGATIVE NEGATIVE mg/dL   Protein, ur NEGATIVE NEGATIVE mg/dL   Nitrite NEGATIVE NEGATIVE   Leukocytes,Ua NEGATIVE NEGATIVE    Comment: Performed at [place] [address] [place], [place] [address]  VITAMIN D 25 Hydroxy (Vit-D Deficiency, Fractures)     Status: None   Collection Time: 01/22/20  4:23 AM  Result Value Ref Range   Vit D, 25-Hydroxy 33.97 30 - 100 ng/mL    Comment: (NOTE) Vitamin D deficiency has been defined by the Institute of Medicine  and an Endocrine Society practice  guideline as a level of serum 25-OH  vitamin D less than 20 ng/mL (1,2).  The Endocrine Society went on to  further define vitamin D insufficiency as a level between 21 and 29  ng/mL (2).  1. IOM (Institute of Medicine). 2010. Dietary reference intakes for  calcium and D. [place]: The [employer]. 2. Holick MF, Binkley Whitwell, Bischoff-Ferrari HA, et al. Evaluation,  treatment, and prevention of vitamin D deficiency: an Endocrine  Society clinical practice guideline, JCEM. 2011 Jul; 96(7): 1911-30.  Performed at [facility], [place] [address], [place], [place] [address]   Albumin     Status: Abnormal   Collection Time: 01/22/20  4:23 AM  Result Value Ref Range   Albumin 3.4 (L) 3.5 - 5.0 g/dL    Comment: Performed at [facility] [place] [address] [place], [place] [address]  CBC     Status: Abnormal   Collection Time: 01/22/20  4:23 AM  Result Value Ref Range   WBC 11.7 (H) 4.0 - 10.5 K/uL   RBC 4.35 3.87 - 5.11 MIL/uL   Hemoglobin 14.3 12.0 - 15.0 g/dL   HCT 45.2 36.0 - 46.0 %   MCV 103.9 (H) 80.0 - 100.0 fL   MCH 32.9 26.0 - 34.0 pg   MCHC 31.6 30.0 - 36.0 g/dL   RDW 12.1 11.5 - 15.5 %   Platelets 248 150 - 400 K/uL   nRBC 0.0 0.0 - 0.2 %    Comment: Performed at [facility], [place] [address], [place], [place] [address]  Basic metabolic panel     Status: Abnormal   Collection Time: 01/22/20  4:23 AM  Result Value Ref Range   Sodium 138 135 - 145 mmol/L   Potassium 4.4 3.5 - 5.1 mmol/L   Chloride 99 98 - 111 mmol/L   CO2 29 22 - 32 mmol/L   Glucose, Bld 91 70 - 99 mg/dL    Comment: Glucose reference range applies only to samples taken after fasting for at least 8 hours.   BUN 14 8 - 23 mg/dL   Creatinine, Ser 0.89 0.44 - 1.00 mg/dL   Calcium 8.4 (L) 8.9 - 10.3 mg/dL   GFR, Estimated 59 (L) >60 mL/min    Comment: (NOTE) Calculated using the CKD-EPI Creatinine Equation (2021)    Anion gap 10 5 - 15    Comment: Performed at [facility], [place] [address]  [place], [place] [address]   DG Ribs Bilateral W/Chest  Result Date: 01/22/2020 CLINICAL DATA:  87 year old female with shortness of breath status post fall. EXAM: BILATERAL RIBS AND CHEST - 4+ VIEW COMPARISON:  01/21/2020 FINDINGS: No fracture or other bone lesions are seen involving the ribs. There is no evidence of pneumothorax or pleural effusion. Similar appearing perifissural nodular opacity in the right upper lobe. Similar appearing moderate hiatal hernia. Heart size and mediastinal contours are unchanged. IMPRESSION: 1. No evidence of acute rib fracture. 2. Similar appearing right upper lobe perifissural nodular opacity. 3. Unchanged moderate hiatal hernia. Electronically Signed   By: Ruthann Cancer MD   On: 01/22/2020 10:25   DG Chest Port 1 View  Result Date: 01/21/2020 CLINICAL DATA:  Right hip fracture following a fall. Click EXAM: PORTABLE CHEST 1 VIEW COMPARISON:  07/30/2019.  Chest CTA dated 07/02/2019. FINDINGS: Normal sized heart. Stable moderate-sized hiatal hernia. Scarring in the left mid to lower lung zone without significant change. Stable small irregular nodule in the right  upper lobe. This area was obscured by multiple opacities in the right upper lobe on 07/02/2019, including multiple nodular opacities. No fracture or pneumothorax seen. IMPRESSION: 1. No fracture or pneumothorax. 2. Stable irregular nodule in the right upper lobe. 3. Stable moderate-sized hiatal hernia. Electronically Signed   By: Claudie Revering M.D.   On: 01/21/2020 14:53   DG Hip Unilat With Pelvis 2-3 Views Right  Result Date: 01/21/2020 CLINICAL DATA:  Right hip pain following a fall. EXAM: DG HIP (WITH OR WITHOUT PELVIS) 2-3V RIGHT COMPARISON:  None. FINDINGS: Right femoral neck fracture with proximal displacement and varus angulation of the distal fragment IMPRESSION: Right femoral neck fracture. Electronically Signed   By: Claudie Revering M.D.   On: 01/21/2020 14:48   DG FEMUR, MIN 2 VIEWS RIGHT  Result Date:  01/21/2020 CLINICAL DATA:  Right hip pain following a fall. EXAM: RIGHT FEMUR 2 VIEWS COMPARISON:  Right hip radiographs obtained at the same time. FINDINGS: Previously described right femoral neck fracture. No additional fractures or dislocations. IMPRESSION: Previously described right femoral neck fracture. Electronically Signed   By: Claudie Revering M.D.   On: 01/21/2020 14:49    Pending Labs Unresulted Labs (From admission, onward)          Start     Ordered   01/23/20 0500  CBC with Differential/Platelet  Tomorrow morning,   R        01/22/20 0949   01/23/20 3154  Basic metabolic panel  Tomorrow morning,   R        01/22/20 0949   01/21/20 1745  Culture, Urine  Once,   STAT        01/21/20 1744          Vitals/Pain Today's Vitals   01/22/20 0813 01/22/20 0930 01/22/20 0949 01/22/20 1030  BP:  (!) 142/84  (!) 146/85  Pulse:  (!) 102  (!) 103  Resp:  (!) 31  (!) 29  Temp:      TempSrc:      SpO2:  91%  (!) 88%  Weight:      Height:      PainSc: 8   8      Isolation Precautions Airborne and Contact precautions  Medications Medications  fentaNYL (SUBLIMAZE) injection 50 mcg (50 mcg Intravenous Not Given 01/22/20 1003)  amLODipine (NORVASC) tablet 2.5 mg (2.5 mg Oral Given 01/21/20 1944)  carvedilol (COREG) tablet 6.25 mg (6.25 mg Oral Given 01/22/20 0818)  ALPRAZolam (XANAX) tablet 0.5 mg (0.5 mg Oral Given 01/22/20 0821)  DULoxetine (CYMBALTA) DR capsule 20 mg (20 mg Oral Given 01/22/20 1028)  escitalopram (LEXAPRO) tablet 5 mg (5 mg Oral Given 01/22/20 1028)  levothyroxine (SYNTHROID) tablet 50 mcg (50 mcg Oral Given 01/22/20 0513)  predniSONE (DELTASONE) tablet 10 mg (10 mg Oral Given 01/22/20 0818)  calcium carbonate (TUMS - dosed in mg elemental calcium) chewable tablet 200 mg of elemental calcium (has no administration in time range)  pantoprazole (PROTONIX) EC tablet 40 mg (40 mg Oral Given 01/22/20 1028)  senna (SENOKOT) tablet 8.6 mg (has no administration in time range)   clopidogrel (PLAVIX) tablet 75 mg (75 mg Oral Given 01/22/20 1028)  albuterol (PROVENTIL) (2.5 MG/3ML) 0.083% nebulizer solution 2.5 mg (2.5 mg Nebulization Given 01/22/20 1053)  methocarbamol (ROBAXIN) tablet 500 mg (500 mg Oral Given 01/22/20 0513)    Or  methocarbamol (ROBAXIN) 500 mg in dextrose 5 % 50 mL IVPB ( Intravenous See Alternative 01/22/20 0513)  polyethylene glycol (MIRALAX /  GLYCOLAX) packet 17 g (has no administration in time range)  sorbitol 70 % solution 30 mL (has no administration in time range)  sodium phosphate (FLEET) 7-19 GM/118ML enema 1 enema (has no administration in time range)  acetaminophen (TYLENOL) tablet 500 mg (500 mg Oral Given 01/22/20 1027)  fentaNYL (SUBLIMAZE) injection 50 mcg (50 mcg Intravenous Given 01/21/20 1955)  ALPRAZolam (XANAX) tablet 0.5 mg (0.5 mg Oral Given 01/22/20 0512)  iron polysaccharides (NIFEREX) capsule 150 mg (150 mg Oral Given 01/22/20 1027)  latanoprost (XALATAN) 0.005 % ophthalmic solution 1 drop (1 drop Both Eyes Given 01/21/20 2318)  traMADol (ULTRAM) tablet 100 mg (100 mg Oral Given 01/22/20 0818)  fentaNYL (SUBLIMAZE) injection 50 mcg (has no administration in time range)  Brinzolamide-Brimonidine 1-0.2 % SUSP 1 drop (has no administration in time range)  guaiFENesin (MUCINEX) 12 hr tablet 1,200 mg (1,200 mg Oral Given 01/22/20 1032)    Mobility non-ambulatory     Focused Assessments MDK   R Recommendations: See Admitting Provider Note  Report given to:   Additional Notes:

## 2020-01-22 NOTE — Progress Notes (Signed)
Pt refused vest therapy due to pain.

## 2020-01-22 NOTE — Progress Notes (Signed)
PROGRESS NOTE    Sarah [name]  FAO:[id number] DOB: 17-Jul-1921 DOA: 01/21/2020 PCP: [name], [name], MD    Chief Complaint  Patient presents with  . Hip Pain    Brief Narrative:  Patient [age] year old female history of chronic shortness of breath/bronchiectasis being followed by pulmonary, GERD, diastolic dysfunction, history of hiatal hernia who presented to the ED after mechanical fall and noted to have a right femoral neck fracture.  Orthopedics consulted and patient for hip repair hopefully 01/23/2020.   Assessment & Plan:   Principal Problem:   Closed displaced fracture of right femoral neck (HCC) Active Problems:   Essential hypertension   Allergic rhinitis   BRONCHIECTASIS   Idiopathic peripheral neuropathy   Depression   Hypothyroidism   Arthritis   Anxiety   Malignant hypertension   TIA (transient ischemic attack)   Chronic diastolic CHF (congestive heart failure) (HCC)   HLD (hyperlipidemia)   HTN (hypertension)   GERD (gastroesophageal reflux disease)   History of CVA (cerebrovascular accident)   CAD (coronary artery disease)   Age-related physical debility   Bilateral hearing loss   Chronic kidney disease, stage 3 unspecified (HCC)   Generalized anxiety disorder   Iron deficiency anemia   Vitamin D deficiency   1 closed right femoral neck fracture Secondary to mechanical fall.  Patient denies any syncopal episodes.  No chest pain.  No recent heart attacks.  Patient able to ambulate around the square at her facility.  Patient seen in consultation by orthopedics who are recommending hip repair to be done on Sunday, 01/23/2020.  Per orthopedics okay to continue patient's home regimen of Plavix.  SCDs for now.    Patient with complaints of ongoing pain.  Continue scheduled Tylenol.  Increase Ultram 100 mg as needed breakthrough pain.  Placed back on IV fentanyl as needed for severe breakthrough pain.  We will make patient n.p.o. after midnight in anticipation of  probable surgery tomorrow.  Per orthopedics.   2.  History of bronchiectasis Patient with history of bronchiectasis and chronic cough.  Patient with congestion this morning, coughing.  Patient being followed by pulmonary in the outpatient setting and patient being treated by rotating antibiotics, chest vest, nebs, PPI.  Place back on home regimen chest vest, rotating antibiotics, PPI, albuterol nebs.   Add Mucinex.  Continue incentive spirometry, flutter valve.  Follow.  3.  Rib cage pain Patient with complaints of bilateral chest pain/rib cage pain.  Likely secondary to mechanical fall.  Check rib films.  Incentive spirometry.  Flutter valve.  Pain management.  4.  History of CVA Continue home regimen Plavix for secondary stroke prophylaxis.  5.  Chronic kidney disease stage IIIa Stable.    Saline lock IV fluids follow.  6.  Leukocytosis Likely reactive leukocytosis.  Patient however with complaints of increased urinary frequency on admission.  Urinalysis nitrite negative, leukocytes negative.  Chest x-ray done on admission negative for acute infiltrate.  Leukocytosis trending down.  Follow.  7.  Iron deficiency anemia Hemoglobin currently at 14.3.  Hemoglobin was 16.4 on admission secondary to dehydration.  No overt bleeding.  Transfusion threshold hemoglobin < 7.  8.  Vitamin D deficiency Continue oral supplementation.   9.  History of hiatal hernia/GERD Continue PPI twice daily.    10.  Generalized anxiety disorder/depression Continue home regimen anxiolytics, Lexapro, Cymbalta.  11.  Hypertension Continue home regimen Coreg, Norvasc.  12.  Iron deficiency anemia Hemoglobin currently at 14.3 and at baseline.  Continue oral iron supplementation.  Follow.   13.  Hypothyroidism Continue Synthroid.   14.  Coronary artery disease Currently stable.  Continue home regimen Coreg, Plavix, Norvasc.  Outpatient follow-up.    DVT prophylaxis: SCD/per orthopedics Code  Status: DNR Family Communication: Updated patient and daughter at bedside. Disposition:   Status is: Inpatient    Dispo: The patient is from: Independent living facility              Anticipated d/c is to: SNF versus back to independent living facility with home health.              Anticipated d/c date is: 3 to 4 days per              Patient currently with right femoral neck fracture awaiting surgery, complains of rib cage pain.  Not stable for discharge.       Consultants:   Orthopedics: Dr. Shon Baton 01/21/2020  Procedures:   Plain films of the pelvis and right hip 01/21/2020  Chest x-ray 01/21/2020  Antimicrobials:   None   Subjective: Patient c/o bilateral rib cage/chest pain and leg pain. Congested, coughing.  States she is feeling miserable.  Some complaints of shortness of breath.  Objective: Vitals:   01/22/20 0430 01/22/20 0500 01/22/20 0530 01/22/20 0600  BP: 132/72 134/71 131/73 125/66  Pulse: 79 77 85 82  Resp: 20 16 (!) 22 (!) 21  Temp:      TempSrc:      SpO2: 94% 93% 93% 96%  Weight:      Height:       No intake or output data in the 24 hours ending 01/22/20 0910 Filed Weights   01/21/20 1454  Weight: 54.4 kg    Examination:  General exam: Appears calm and comfortable  Respiratory system: Coarse diffuse breath sounds.  No wheezing noted.  No crackles.  Normal respiratory effort.   Cardiovascular system: S1 & S2 heard, RRR. No JVD, murmurs, rubs, gallops or clicks. No pedal edema. Gastrointestinal system: Abdomen is nondistended, soft and nontender. No organomegaly or masses felt. Normal bowel sounds heard. Central nervous system: Alert and oriented. No focal neurological deficits. Extremities: Symmetric 5 x 5 power. Skin: No rashes, lesions or ulcers Psychiatry: Judgement and insight appear normal. Mood & affect appropriate.     Data Reviewed: I have personally reviewed following labs and imaging studies  CBC: Recent Labs  Lab  01/21/20 1338 01/22/20 0423  WBC 15.4* 11.7*  NEUTROABS 14.1*  --   HGB 16.4* 14.3  HCT 52.7* 45.2  MCV 103.1* 103.9*  PLT 277 248    Basic Metabolic Panel: Recent Labs  Lab 01/21/20 1338 01/22/20 0423  NA 136 138  K 4.8 4.4  CL 95* 99  CO2 32 29  GLUCOSE 153* 91  BUN 14 14  CREATININE 1.03* 0.89  CALCIUM 8.7* 8.4*    GFR: Estimated Creatinine Clearance: 31.8 mL/min (by C-G formula based on SCr of 0.89 mg/dL).  Liver Function Tests: Recent Labs  Lab 01/22/20 0423  ALBUMIN 3.4*    CBG: No results for input(s): GLUCAP in the last 168 hours.   Recent Results (from the past 240 hour(s))  SARS CORONAVIRUS 2 (TAT 6-24 HRS) Nasopharyngeal Nasopharyngeal Swab     Status: None   Collection Time: 01/21/20  1:38 PM   Specimen: Nasopharyngeal Swab  Result Value Ref Range Status   SARS Coronavirus 2 NEGATIVE NEGATIVE Final    Comment: (NOTE) SARS-CoV-2 target nucleic acids are NOT DETECTED.  The SARS-CoV-2  RNA is generally detectable in upper and lower respiratory specimens during the acute phase of infection. Negative results do not preclude SARS-CoV-2 infection, do not rule out co-infections with other pathogens, and should not be used as the sole basis for treatment or other patient management decisions. Negative results must be combined with clinical observations, patient history, and epidemiological information. The expected result is Negative.  Fact Sheet for Patients: HairSlick.no  Fact Sheet for Healthcare Providers: quierodirigir.com  This test is not yet approved or cleared by the [place] FDA and  has been authorized for detection and/or diagnosis of SARS-CoV-2 by FDA under an Emergency Use Authorization (EUA). This EUA will remain  in effect (meaning this test can be used) for the duration of the COVID-19 declaration under Se ction 564(b)(1) of the Act, 21 U.S.C. section 360bbb-3(b)(1),  unless the authorization is terminated or revoked sooner.  Performed at [facility], [address] [place], [place] [address]          Radiology Studies: DG Chest Port 1 View  Result Date: 01/21/2020 CLINICAL DATA:  Right hip fracture following a fall. Click EXAM: PORTABLE CHEST 1 VIEW COMPARISON:  07/30/2019.  Chest CTA dated 07/02/2019. FINDINGS: Normal sized heart. Stable moderate-sized hiatal hernia. Scarring in the left mid to lower lung zone without significant change. Stable small irregular nodule in the right upper lobe. This area was obscured by multiple opacities in the right upper lobe on 07/02/2019, including multiple nodular opacities. No fracture or pneumothorax seen. IMPRESSION: 1. No fracture or pneumothorax. 2. Stable irregular nodule in the right upper lobe. 3. Stable moderate-sized hiatal hernia. Electronically Signed   By: Beckie Salts M.D.   On: 01/21/2020 14:53   DG Hip Unilat With Pelvis 2-3 Views Right  Result Date: 01/21/2020 CLINICAL DATA:  Right hip pain following a fall. EXAM: DG HIP (WITH OR WITHOUT PELVIS) 2-3V RIGHT COMPARISON:  None. FINDINGS: Right femoral neck fracture with proximal displacement and varus angulation of the distal fragment IMPRESSION: Right femoral neck fracture. Electronically Signed   By: Beckie Salts M.D.   On: 01/21/2020 14:48   DG FEMUR, MIN 2 VIEWS RIGHT  Result Date: 01/21/2020 CLINICAL DATA:  Right hip pain following a fall. EXAM: RIGHT FEMUR 2 VIEWS COMPARISON:  Right hip radiographs obtained at the same time. FINDINGS: Previously described right femoral neck fracture. No additional fractures or dislocations. IMPRESSION: Previously described right femoral neck fracture. Electronically Signed   By: Beckie Salts M.D.   On: 01/21/2020 14:49        Scheduled Meds: . acetaminophen  500 mg Oral TID  . albuterol  2.5 mg Nebulization BID  . ALPRAZolam  0.5 mg Oral Q12H  . amLODipine  2.5 mg Oral QPM  . carvedilol  6.25 mg  Oral BID WC  . clopidogrel  75 mg Oral Daily  . DULoxetine  20 mg Oral TID  . escitalopram  5 mg Oral Daily  . iron polysaccharides  150 mg Oral Daily  . latanoprost  1 drop Both Eyes QHS  . levothyroxine  50 mcg Oral Daily  . pantoprazole  40 mg Oral BID  . predniSONE  10 mg Oral Q breakfast   Continuous Infusions: . sodium chloride 75 mL/hr at 01/22/20 0822  . methocarbamol (ROBAXIN) IV       LOS: 1 day    Time spent: 35 minutes    Ramiro Harvest, MD [facility]   To contact the attending provider between 7A-7P or  the covering provider during after hours 7P-7A, please log into the web site [email or web address] and access using universal [place] password for that web site. If you do not have the password, please call the hospital operator.  01/22/2020, 9:10 AM

## 2020-01-22 NOTE — Plan of Care (Signed)
  Problem: Education: Goal: Knowledge of General Education information will improve Description: Including pain rating scale, medication(s)/side effects and non-pharmacologic comfort measures Outcome: Progressing   Problem: Pain Managment: Goal: General experience of comfort will improve Outcome: Progressing   

## 2020-01-22 NOTE — ED Notes (Signed)
Oxygen bumped up to 4 L at this time; after neb treatment oxygen reading is now at 100%.

## 2020-01-23 ENCOUNTER — Inpatient Hospital Stay (HOSPITAL_COMMUNITY): Payer: Medicare Other | Admitting: Anesthesiology

## 2020-01-23 ENCOUNTER — Encounter (HOSPITAL_COMMUNITY)
Admission: EM | Disposition: A | Discharge status: Skilled Nursing Facility | Payer: Self-pay | Source: Skilled Nursing Facility | Attending: Internal Medicine

## 2020-01-23 ENCOUNTER — Inpatient Hospital Stay (HOSPITAL_COMMUNITY): Payer: Medicare Other

## 2020-01-23 ENCOUNTER — Inpatient Hospital Stay (HOSPITAL_COMMUNITY): Admission: RE | Admit: 2020-01-23 | Payer: Medicare Other | Source: Home / Self Care | Admitting: Orthopedic Surgery

## 2020-01-23 DIAGNOSIS — S72001A Fracture of unspecified part of neck of right femur, initial encounter for closed fracture: Secondary | ICD-10-CM | POA: Diagnosis not present

## 2020-01-23 HISTORY — PX: ANTERIOR APPROACH HEMI HIP ARTHROPLASTY: SHX6690

## 2020-01-23 LAB — CBC WITH DIFFERENTIAL/PLATELET
Abs Immature Granulocytes: 0.04 10*3/uL (ref 0.00–0.07)
Basophils Absolute: 0 10*3/uL (ref 0.0–0.1)
Basophils Relative: 0 %
Eosinophils Absolute: 0.1 10*3/uL (ref 0.0–0.5)
Eosinophils Relative: 1 %
HCT: 43.7 % (ref 36.0–46.0)
Hemoglobin: 13.7 g/dL (ref 12.0–15.0)
Immature Granulocytes: 0 %
Lymphocytes Relative: 9 %
Lymphs Abs: 1 10*3/uL (ref 0.7–4.0)
MCH: 33 pg (ref 26.0–34.0)
MCHC: 31.4 g/dL (ref 30.0–36.0)
MCV: 105.3 fL — ABNORMAL HIGH (ref 80.0–100.0)
Monocytes Absolute: 1 10*3/uL (ref 0.1–1.0)
Monocytes Relative: 8 %
Neutro Abs: 9.5 10*3/uL — ABNORMAL HIGH (ref 1.7–7.7)
Neutrophils Relative %: 82 %
Platelets: 191 10*3/uL (ref 150–400)
RBC: 4.15 MIL/uL (ref 3.87–5.11)
RDW: 11.9 % (ref 11.5–15.5)
WBC: 11.7 10*3/uL — ABNORMAL HIGH (ref 4.0–10.5)
nRBC: 0 % (ref 0.0–0.2)

## 2020-01-23 LAB — URINE CULTURE: Culture: <10000 — AB

## 2020-01-23 LAB — BASIC METABOLIC PANEL
Anion gap: 8 (ref 5–15)
BUN: 13 mg/dL (ref 8–23)
CO2: 30 mmol/L (ref 22–32)
Calcium: 8.5 mg/dL — ABNORMAL LOW (ref 8.9–10.3)
Chloride: 100 mmol/L (ref 98–111)
Creatinine, Ser: 0.81 mg/dL (ref 0.44–1.00)
GFR, Estimated: >60 mL/min (ref >60)
Glucose, Bld: 75 mg/dL (ref 70–99)
Potassium: 4.2 mmol/L (ref 3.5–5.1)
Sodium: 138 mmol/L (ref 135–145)

## 2020-01-23 LAB — SURGICAL PCR SCREEN
MRSA, PCR: NEGATIVE
Staphylococcus aureus: NEGATIVE

## 2020-01-23 LAB — PREPARE RBC (CROSSMATCH)

## 2020-01-23 SURGERY — HEMIARTHROPLASTY, HIP, DIRECT ANTERIOR APPROACH, FOR FRACTURE
Anesthesia: General | Site: Hip | Laterality: Right

## 2020-01-23 MED ORDER — PHENYLEPHRINE 40 MCG/ML (10ML) SYRINGE FOR IV PUSH (FOR BLOOD PRESSURE SUPPORT)
PREFILLED_SYRINGE | INTRAVENOUS | Status: AC
  Filled 2020-01-23: qty 10

## 2020-01-23 MED ORDER — PROPOFOL 1000 MG/100ML IV EMUL
INTRAVENOUS | Status: AC
  Filled 2020-01-23: qty 100

## 2020-01-23 MED ORDER — PHENYLEPHRINE HCL (PRESSORS) 10 MG/ML IV SOLN
INTRAVENOUS | Status: AC
  Filled 2020-01-23: qty 2

## 2020-01-23 MED ORDER — FENTANYL CITRATE (PF) 100 MCG/2ML IJ SOLN: 25.0000 ug | INTRAMUSCULAR | Status: DC | PRN

## 2020-01-23 MED ORDER — PROPOFOL 10 MG/ML IV BOLUS
INTRAVENOUS | Status: AC
  Filled 2020-01-23: qty 20

## 2020-01-23 MED ORDER — POVIDONE-IODINE 10 % EX SWAB: 2.0000 "application " | Freq: Once | CUTANEOUS | Status: DC

## 2020-01-23 MED ORDER — ROCURONIUM BROMIDE 10 MG/ML (PF) SYRINGE
PREFILLED_SYRINGE | INTRAVENOUS | Status: DC | PRN
  Administered 2020-01-23: 40 mg via INTRAVENOUS

## 2020-01-23 MED ORDER — 0.9 % SODIUM CHLORIDE (POUR BTL) OPTIME
TOPICAL | Status: DC | PRN
  Administered 2020-01-23: 1000 mL

## 2020-01-23 MED ORDER — LACTATED RINGERS IV SOLN: INTRAVENOUS | Status: DC | PRN

## 2020-01-23 MED ORDER — AMISULPRIDE (ANTIEMETIC) 5 MG/2ML IV SOLN: 10.0000 mg | Freq: Once | INTRAVENOUS | Status: DC | PRN

## 2020-01-23 MED ORDER — ROCURONIUM BROMIDE 10 MG/ML (PF) SYRINGE
PREFILLED_SYRINGE | INTRAVENOUS | Status: AC
  Filled 2020-01-23: qty 10

## 2020-01-23 MED ORDER — ONDANSETRON HCL 4 MG/2ML IJ SOLN: 4.0000 mg | Freq: Once | INTRAMUSCULAR | Status: DC | PRN

## 2020-01-23 MED ORDER — KETOROLAC TROMETHAMINE 30 MG/ML IJ SOLN
INTRAMUSCULAR | Status: DC | PRN
  Administered 2020-01-23: 30 mg

## 2020-01-23 MED ORDER — MENTHOL 3 MG MT LOZG: 1.0000 | LOZENGE | OROMUCOSAL | Status: DC | PRN

## 2020-01-23 MED ORDER — ASPIRIN 81 MG PO CHEW
81.0000 mg | CHEWABLE_TABLET | Freq: Two times a day (BID) | ORAL | Status: DC
  Administered 2020-01-23 – 2020-01-27 (×8): 81 mg via ORAL
  Filled 2020-01-23 (×8): qty 1

## 2020-01-23 MED ORDER — ONDANSETRON HCL 4 MG/2ML IJ SOLN
INTRAMUSCULAR | Status: AC
  Filled 2020-01-23: qty 2

## 2020-01-23 MED ORDER — DEXAMETHASONE SODIUM PHOSPHATE 10 MG/ML IJ SOLN
INTRAMUSCULAR | Status: AC
  Filled 2020-01-23: qty 1

## 2020-01-23 MED ORDER — STERILE WATER FOR IRRIGATION IR SOLN
Status: DC | PRN
  Administered 2020-01-23: 2000 mL

## 2020-01-23 MED ORDER — CLOPIDOGREL BISULFATE 75 MG PO TABS
75.0000 mg | ORAL_TABLET | Freq: Every day | ORAL | Status: DC
  Administered 2020-01-24 – 2020-01-27 (×4): 75 mg via ORAL
  Filled 2020-01-23 (×4): qty 1

## 2020-01-23 MED ORDER — PROPOFOL 10 MG/ML IV BOLUS
INTRAVENOUS | Status: DC | PRN
  Administered 2020-01-23: 100 mg via INTRAVENOUS

## 2020-01-23 MED ORDER — ENSURE SURGERY PO LIQD
237.0000 mL | Freq: Two times a day (BID) | ORAL | Status: DC
  Administered 2020-01-23 – 2020-01-26 (×4): 237 mL via ORAL

## 2020-01-23 MED ORDER — SUCCINYLCHOLINE CHLORIDE 200 MG/10ML IV SOSY
PREFILLED_SYRINGE | INTRAVENOUS | Status: DC | PRN
  Administered 2020-01-23: 100 mg via INTRAVENOUS

## 2020-01-23 MED ORDER — LIDOCAINE HCL (PF) 2 % IJ SOLN
INTRAMUSCULAR | Status: AC
  Filled 2020-01-23: qty 5

## 2020-01-23 MED ORDER — PHENOL 1.4 % MT LIQD: 1.0000 | OROMUCOSAL | Status: DC | PRN

## 2020-01-23 MED ORDER — TRANEXAMIC ACID-NACL 1000-0.7 MG/100ML-% IV SOLN
1000.0000 mg | INTRAVENOUS | Status: AC
  Administered 2020-01-23: 1000 mg via INTRAVENOUS
  Filled 2020-01-23: qty 100

## 2020-01-23 MED ORDER — PHENYLEPHRINE HCL-NACL 20-0.9 MG/250ML-% IV SOLN
INTRAVENOUS | Status: DC | PRN
  Administered 2020-01-23: 25 ug/min via INTRAVENOUS

## 2020-01-23 MED ORDER — POVIDONE-IODINE 10 % EX SWAB
2.0000 "application " | Freq: Once | CUTANEOUS | Status: AC
  Administered 2020-01-23: 2 via TOPICAL

## 2020-01-23 MED ORDER — PROPOFOL 500 MG/50ML IV EMUL
INTRAVENOUS | Status: DC | PRN
  Administered 2020-01-23: 50 ug/kg/min via INTRAVENOUS
  Administered 2020-01-23: 100 ug/kg/min via INTRAVENOUS

## 2020-01-23 MED ORDER — CEFAZOLIN SODIUM-DEXTROSE 2-4 GM/100ML-% IV SOLN
2.0000 g | INTRAVENOUS | Status: AC
  Administered 2020-01-23: 2 g via INTRAVENOUS

## 2020-01-23 MED ORDER — SUCCINYLCHOLINE CHLORIDE 200 MG/10ML IV SOSY
PREFILLED_SYRINGE | INTRAVENOUS | Status: AC
  Filled 2020-01-23: qty 10

## 2020-01-23 MED ORDER — SODIUM CHLORIDE 0.9 % IV SOLN
INTRAVENOUS | Status: AC | PRN
  Administered 2020-01-23: 4000 mL

## 2020-01-23 MED ORDER — METOCLOPRAMIDE HCL 5 MG/ML IJ SOLN: 5.0000 mg | Freq: Three times a day (TID) | INTRAMUSCULAR | Status: DC | PRN

## 2020-01-23 MED ORDER — FENTANYL CITRATE (PF) 100 MCG/2ML IJ SOLN
INTRAMUSCULAR | Status: AC
  Filled 2020-01-23: qty 2

## 2020-01-23 MED ORDER — BUPIVACAINE-EPINEPHRINE (PF) 0.5% -1:200000 IJ SOLN
INTRAMUSCULAR | Status: DC | PRN
  Administered 2020-01-23: 30 mL

## 2020-01-23 MED ORDER — FENTANYL CITRATE (PF) 100 MCG/2ML IJ SOLN
INTRAMUSCULAR | Status: DC | PRN
  Administered 2020-01-23 (×2): 25 ug via INTRAVENOUS

## 2020-01-23 MED ORDER — PHENYLEPHRINE 40 MCG/ML (10ML) SYRINGE FOR IV PUSH (FOR BLOOD PRESSURE SUPPORT)
PREFILLED_SYRINGE | INTRAVENOUS | Status: DC | PRN
  Administered 2020-01-23: 80 ug via INTRAVENOUS

## 2020-01-23 MED ORDER — DOCUSATE SODIUM 100 MG PO CAPS
100.0000 mg | ORAL_CAPSULE | Freq: Two times a day (BID) | ORAL | Status: DC
  Administered 2020-01-23 – 2020-01-27 (×7): 100 mg via ORAL
  Filled 2020-01-23 (×8): qty 1

## 2020-01-23 MED ORDER — SODIUM CHLORIDE (PF) 0.9 % IJ SOLN
INTRAMUSCULAR | Status: DC | PRN
  Administered 2020-01-23: 30 mL

## 2020-01-23 MED ORDER — SUGAMMADEX SODIUM 200 MG/2ML IV SOLN
INTRAVENOUS | Status: DC | PRN
  Administered 2020-01-23: 100 mg via INTRAVENOUS

## 2020-01-23 MED ORDER — ADULT MULTIVITAMIN W/MINERALS CH
1.0000 | ORAL_TABLET | Freq: Every day | ORAL | Status: DC
  Administered 2020-01-24 – 2020-01-27 (×4): 1 via ORAL
  Filled 2020-01-23 (×4): qty 1

## 2020-01-23 MED ORDER — CHLORHEXIDINE GLUCONATE 4 % EX LIQD: 60.0000 mL | Freq: Once | CUTANEOUS | Status: DC

## 2020-01-23 MED ORDER — CHLORHEXIDINE GLUCONATE CLOTH 2 % EX PADS: 6.0000 | MEDICATED_PAD | Freq: Every day | CUTANEOUS | Status: DC

## 2020-01-23 MED ORDER — DEXAMETHASONE SODIUM PHOSPHATE 10 MG/ML IJ SOLN
INTRAMUSCULAR | Status: DC | PRN
  Administered 2020-01-23: 8 mg via INTRAVENOUS

## 2020-01-23 MED ORDER — ONDANSETRON HCL 4 MG PO TABS: 4.0000 mg | ORAL_TABLET | Freq: Four times a day (QID) | ORAL | Status: DC | PRN

## 2020-01-23 MED ORDER — ACETAMINOPHEN 10 MG/ML IV SOLN: 1000.0000 mg | Freq: Once | INTRAVENOUS | Status: DC

## 2020-01-23 MED ORDER — LIDOCAINE 2% (20 MG/ML) 5 ML SYRINGE
INTRAMUSCULAR | Status: DC | PRN
  Administered 2020-01-23: 80 mg via INTRAVENOUS

## 2020-01-23 MED ORDER — CEFAZOLIN SODIUM-DEXTROSE 2-4 GM/100ML-% IV SOLN
INTRAVENOUS | Status: AC
  Filled 2020-01-23: qty 100

## 2020-01-23 MED ORDER — ONDANSETRON HCL 4 MG/2ML IJ SOLN: 4.0000 mg | Freq: Four times a day (QID) | INTRAMUSCULAR | Status: DC | PRN

## 2020-01-23 MED ORDER — CEFAZOLIN SODIUM-DEXTROSE 2-4 GM/100ML-% IV SOLN
2.0000 g | Freq: Four times a day (QID) | INTRAVENOUS | Status: AC
  Administered 2020-01-23 – 2020-01-24 (×2): 2 g via INTRAVENOUS
  Filled 2020-01-23 (×2): qty 100

## 2020-01-23 MED ORDER — ONDANSETRON HCL 4 MG/2ML IJ SOLN
INTRAMUSCULAR | Status: DC | PRN
  Administered 2020-01-23: 4 mg via INTRAVENOUS

## 2020-01-23 MED ORDER — METOCLOPRAMIDE HCL 5 MG PO TABS: 5.0000 mg | ORAL_TABLET | Freq: Three times a day (TID) | ORAL | Status: DC | PRN

## 2020-01-23 SURGICAL SUPPLY — 64 items
ADH SKN CLS APL DERMABOND .7 (GAUZE/BANDAGES/DRESSINGS) ×2
APL PRP STRL LF DISP 70% ISPRP (MISCELLANEOUS) ×1
CHLORAPREP W/TINT 26 (MISCELLANEOUS) ×2 IMPLANT
COVER SURGICAL LIGHT HANDLE (MISCELLANEOUS) ×2 IMPLANT
DERMABOND ADVANCED (GAUZE/BANDAGES/DRESSINGS) ×2
DERMABOND ADVANCED .7 DNX12 (GAUZE/BANDAGES/DRESSINGS) ×2 IMPLANT
DRAPE IMP U-DRAPE 54X76 (DRAPES) ×2 IMPLANT
DRAPE SHEET LG 3/4 BI-LAMINATE (DRAPES) ×4 IMPLANT
DRAPE STERI IOBAN 125X83 (DRAPES) ×2 IMPLANT
DRAPE U-SHAPE 47X51 STRL (DRAPES) ×4 IMPLANT
DRESSING AQUACEL AG SP 3.5X10 (GAUZE/BANDAGES/DRESSINGS) IMPLANT
DRSG AQUACEL AG ADV 3.5X10 (GAUZE/BANDAGES/DRESSINGS) ×2 IMPLANT
DRSG AQUACEL AG SP 3.5X10 (GAUZE/BANDAGES/DRESSINGS) ×2
DRSG KUZMA FLUFF (GAUZE/BANDAGES/DRESSINGS) ×3 IMPLANT
DRSG TELFA 3X8 NADH (GAUZE/BANDAGES/DRESSINGS) ×2 IMPLANT
ELECT REM PT RETURN 15FT ADLT (MISCELLANEOUS) ×2 IMPLANT
GLOVE BIO SURGEON STRL SZ7 (GLOVE) ×1 IMPLANT
GLOVE BIO SURGEON STRL SZ8 (GLOVE) ×2 IMPLANT
GLOVE BIO SURGEON STRL SZ8.5 (GLOVE) ×4 IMPLANT
GLOVE BIOGEL M 8.0 STRL (GLOVE) ×3 IMPLANT
GLOVE BIOGEL PI IND STRL 7.0 (GLOVE) IMPLANT
GLOVE BIOGEL PI IND STRL 7.5 (GLOVE) IMPLANT
GLOVE BIOGEL PI IND STRL 8.5 (GLOVE) ×1 IMPLANT
GLOVE BIOGEL PI INDICATOR 7.0 (GLOVE) ×1
GLOVE BIOGEL PI INDICATOR 7.5 (GLOVE) ×3
GLOVE BIOGEL PI INDICATOR 8.5 (GLOVE) ×1
GLOVE INDICATOR 8.0 STRL GRN (GLOVE) ×2 IMPLANT
GLOVE SRG 8 PF TXTR STRL LF DI (GLOVE) ×1 IMPLANT
GLOVE SURG ENC TEXT LTX SZ7.5 (GLOVE) ×4 IMPLANT
GLOVE SURG UNDER POLY LF SZ8 (GLOVE) ×2
GOWN STRL REUS W/ TWL LRG LVL3 (GOWN DISPOSABLE) ×1 IMPLANT
GOWN STRL REUS W/TWL 2XL LVL3 (GOWN DISPOSABLE) ×2 IMPLANT
GOWN STRL REUS W/TWL LRG LVL3 (GOWN DISPOSABLE) ×2
HANDPIECE INTERPULSE COAX TIP (DISPOSABLE) ×2
HEAD FEM UNIPOLAR 46 OD STRL (Hips) ×1 IMPLANT
HOOD PEEL AWAY FLYTE STAYCOOL (MISCELLANEOUS) ×6 IMPLANT
JET LAVAGE IRRISEPT WOUND (IRRIGATION / IRRIGATOR) ×2
KIT TURNOVER KIT A (KITS) ×1 IMPLANT
LAVAGE JET IRRISEPT WOUND (IRRIGATION / IRRIGATOR) ×1 IMPLANT
MANIFOLD NEPTUNE II (INSTRUMENTS) ×2 IMPLANT
MARKER SKIN DUAL TIP RULER LAB (MISCELLANEOUS) ×2 IMPLANT
NDL SPNL 18GX3.5 QUINCKE PK (NEEDLE) ×1 IMPLANT
NEEDLE SPNL 18GX3.5 QUINCKE PK (NEEDLE) ×2 IMPLANT
PACK ANTERIOR HIP CUSTOM (KITS) ×2 IMPLANT
PAD DRESSING TELFA 3X8 NADH (GAUZE/BANDAGES/DRESSINGS) IMPLANT
SAW OSC TIP CART 19.5X105X1.3 (SAW) ×2 IMPLANT
SEALER BIPOLAR AQUA 6.0 (INSTRUMENTS) ×2 IMPLANT
SET HNDPC FAN SPRY TIP SCT (DISPOSABLE) ×1 IMPLANT
SOL PREP PROV IODINE SCRUB 4OZ (MISCELLANEOUS) ×2 IMPLANT
SPACER FEM TAPERED +0 12/14 (Hips) ×1 IMPLANT
STEM TRI LOC GRIPTION SZ 4 STD (Hips) IMPLANT
SUT ETHIBOND NAB CT1 #1 30IN (SUTURE) ×4 IMPLANT
SUT MNCRL AB 3-0 PS2 18 (SUTURE) ×2 IMPLANT
SUT MON AB 2-0 CT1 36 (SUTURE) ×2 IMPLANT
SUT STRATAFIX PDO 1 14 VIOLET (SUTURE) ×2
SUT STRATFX PDO 1 14 VIOLET (SUTURE) ×1
SUT VIC AB 1 CT1 27 (SUTURE) ×2
SUT VIC AB 1 CT1 27XBRD ANTBC (SUTURE) ×1 IMPLANT
SUT VIC AB 2-0 CT1 27 (SUTURE) ×2
SUT VIC AB 2-0 CT1 TAPERPNT 27 (SUTURE) ×1 IMPLANT
SUTURE STRATFX PDO 1 14 VIOLET (SUTURE) ×1 IMPLANT
TRAY CATH INTERMITTENT SS 16FR (CATHETERS) ×1 IMPLANT
TRAY FOLEY MTR SLVR 14FR STAT (SET/KITS/TRAYS/PACK) ×1 IMPLANT
TRI LOC GRIPTION SZ 4 STD (Hips) ×2 IMPLANT

## 2020-01-23 NOTE — Progress Notes (Signed)
Pt stable on return from pacu. No needs on return. Pt persistent cough remains. Family at bedside. rn will continue to monitor.

## 2020-01-23 NOTE — Interval H&P Note (Signed)
History and Physical Interval Note:  01/23/2020 10:59 AM  Sarah [name]  has presented today for surgery, with the diagnosis of right femur neck fracture.  The various methods of treatment have been discussed with the patient and family. After consideration of risks, benefits and other options for treatment, the patient has consented to  Procedure(s): ANTERIOR APPROACH HEMI HIP ARTHROPLASTY (Right) as a surgical intervention.  The patient's history has been reviewed, patient examined, no change in status, stable for surgery.  I have reviewed the patient's chart and labs.  Questions were answered to the patient's satisfaction.    The risks, benefits, and alternatives were discussed with the patient. There are risks associated with the surgery including, but not limited to, problems with anesthesia (death), infection, instability (giving out of the joint), dislocation, differences in leg length/angulation/rotation, fracture of bones, loosening or failure of implants, hematoma (blood accumulation) which may require surgical drainage, blood clots, pulmonary embolism, nerve injury (foot drop and lateral thigh numbness), and blood vessel injury. The patient understands these risks and elects to proceed.    Iline Oven Dianey Suchy

## 2020-01-23 NOTE — Discharge Instructions (Signed)
°Dr. Klaus Casteneda °Joint Replacement Specialist °[facility] °[address] °[place], [place] [address] °[phone] ° ° °TOTAL HIP REPLACEMENT POSTOPERATIVE DIRECTIONS ° ° ° °Hip Rehabilitation, Guidelines Following Surgery  ° °WEIGHT BEARING °Weight bearing as tolerated with assist device (walker, cane, etc) as directed, use it as long as suggested by your surgeon or therapist, typically at least 4-6 weeks. ° °The results of a hip operation are greatly improved after range of motion and muscle strengthening exercises. Follow all safety measures which are given to protect your hip. If any of these exercises cause increased pain or swelling in your joint, decrease the amount until you are comfortable again. Then slowly increase the exercises. Call your caregiver if you have problems or questions.  ° °HOME CARE INSTRUCTIONS  °Most of the following instructions are designed to prevent the dislocation of your new hip.  °Remove items at home which could result in a fall. This includes throw rugs or furniture in walking pathways.  °Continue medications as instructed at time of discharge. °· You may have some home medications which will be placed on hold until you complete the course of blood thinner medication. °· You may start showering once you are discharged home. Do not remove your dressing. °Do not put on socks or shoes without following the instructions of your caregivers.   °Sit on chairs with arms. Use the chair arms to help push yourself up when arising.  °Arrange for the use of a toilet seat elevator so you are not sitting low.  °· Walk with walker as instructed.  °You may resume a sexual relationship in one month or when given the OK by your caregiver.  °Use walker as long as suggested by your caregivers.  °You may put full weight on your legs and walk as much as is comfortable. °Avoid periods of inactivity such as sitting longer than an hour when not asleep. This helps prevent  blood clots.  °You may return to work once you are cleared by your surgeon.  °Do not drive a car for 6 weeks or until released by your surgeon.  °Do not drive while taking narcotics.  °Wear elastic stockings for two weeks following surgery during the day but you may remove then at night.  °Make sure you keep all of your appointments after your operation with all of your doctors and caregivers. You should call the office at the above phone number and make an appointment for approximately two weeks after the date of your surgery. °Please pick up a stool softener and laxative for home use as long as you are requiring pain medications. °· ICE to the affected hip every three hours for 30 minutes at a time and then as needed for pain and swelling. Continue to use ice on the hip for pain and swelling from surgery. You may notice swelling that will progress down to the foot and ankle.  This is normal after surgery.  Elevate the leg when you are not up walking on it.   °It is important for you to complete the blood thinner medication as prescribed by your doctor. °· Continue to use the breathing machine which will help keep your temperature down.  It is common for your temperature to cycle up and down following surgery, especially at night when you are not up moving around and exerting yourself.  The breathing machine keeps your lungs expanded and your temperature down. ° °RANGE OF MOTION AND STRENGTHENING EXERCISES  °These exercises are   designed to help you keep full movement of your hip joint. Follow your caregiver's or physical therapist's instructions. Perform all exercises about fifteen times, three times per day or as directed. Exercise both hips, even if you have had only one joint replacement. These exercises can be done on a training (exercise) mat, on the floor, on a table or on a bed. Use whatever works the best and is most comfortable for you. Use music or television while you are exercising so that the exercises  are a pleasant break in your day. This will make your life better with the exercises acting as a break in routine you can look forward to.  °Lying on your back, slowly slide your foot toward your buttocks, raising your knee up off the floor. Then slowly slide your foot back down until your leg is straight again.  °Lying on your back spread your legs as far apart as you can without causing discomfort.  °Lying on your side, raise your upper leg and foot straight up from the floor as far as is comfortable. Slowly lower the leg and repeat.  °Lying on your back, tighten up the muscle in the front of your thigh (quadriceps muscles). You can do this by keeping your leg straight and trying to raise your heel off the floor. This helps strengthen the largest muscle supporting your knee.  °Lying on your back, tighten up the muscles of your buttocks both with the legs straight and with the knee bent at a comfortable angle while keeping your heel on the floor.  ° °SKILLED REHAB INSTRUCTIONS: °If the patient is transferred to a skilled rehab facility following release from the hospital, a list of the current medications will be sent to the facility for the patient to continue.  When discharged from the skilled rehab facility, please have the facility set up the patient's Home Health Physical Therapy prior to being released. Also, the skilled facility will be responsible for providing the patient with their medications at time of release from the facility to include their pain medication and their blood thinner medication. If the patient is still at the rehab facility at time of the two week follow up appointment, the skilled rehab facility will also need to assist the patient in arranging follow up appointment in our office and any transportation needs. ° °MAKE SURE YOU:  °Understand these instructions.  °Will watch your condition.  °Will get help right away if you are not doing well or get worse. ° °Pick up stool softner and  laxative for home use following surgery while on pain medications. °Do not remove your dressing. °The dressing is waterproof--it is OK to take showers. °Continue to use ice for pain and swelling after surgery. °Do not use any lotions or creams on the incision until instructed by your surgeon. °Total Hip Protocol. ° ° °

## 2020-01-23 NOTE — Transfer of Care (Signed)
Immediate Anesthesia Transfer of Care Note  Patient: [facility]  Procedure(s) Performed: ANTERIOR APPROACH HEMI HIP ARTHROPLASTY (Right Hip)  Patient Location: PACU  Anesthesia Type:General  Level of Consciousness: awake, alert , oriented and patient cooperative  Airway & Oxygen Therapy: Patient Spontanous Breathing and Patient connected to face mask oxygen  Post-op Assessment: Report given to RN, Post -op Vital signs reviewed and stable and Patient moving all extremities  Post vital signs: Reviewed and stable  Last Vitals:  Vitals Value Taken Time  BP 138/74 01/23/20 1316  Temp    Pulse 103 01/23/20 1322  Resp 21 01/23/20 1322  SpO2 100 % 01/23/20 1322  Vitals shown include unvalidated device data.  Last Pain:  Vitals:   01/23/20 0758  TempSrc:   PainSc: 4          Complications: No complications documented.

## 2020-01-23 NOTE — Anesthesia Postprocedure Evaluation (Signed)
Anesthesia Post Note  Patient: Sarah [name]  Procedure(s) Performed: ANTERIOR APPROACH HEMI HIP ARTHROPLASTY (Right Hip)     Patient location during evaluation: PACU Anesthesia Type: General Level of consciousness: awake Pain management: pain level controlled Vital Signs Assessment: post-procedure vital signs reviewed and stable Respiratory status: spontaneous breathing, nonlabored ventilation, respiratory function stable and patient connected to nasal cannula oxygen Cardiovascular status: blood pressure returned to baseline and stable Postop Assessment: no apparent nausea or vomiting Anesthetic complications: no   No complications documented.  Last Vitals:  Vitals:   01/23/20 1430 01/23/20 1459  BP: 118/67   Pulse: 97 90  Resp: 15 18  Temp:  36.7 C  SpO2: 94% 93%    Last Pain:  Vitals:   01/23/20 1459  TempSrc: Oral  PainSc: 0-No pain                 Mellody Dance

## 2020-01-23 NOTE — Anesthesia Procedure Notes (Signed)
Procedure Name: Intubation Date/Time: 01/23/2020 11:15 AM Performed by: Victoriano Lain, CRNA Pre-anesthesia Checklist: Patient identified, Emergency Drugs available, Suction available, Patient being monitored and Timeout performed Patient Re-evaluated:Patient Re-evaluated prior to induction Oxygen Delivery Method: Circle system utilized Preoxygenation: Pre-oxygenation with 100% oxygen Induction Type: IV induction Ventilation: Mask ventilation without difficulty Laryngoscope Size: Glidescope and 3 Grade View: Grade I Tube type: Parker flex tip Tube size: 7.0 mm Number of attempts: 2 Airway Equipment and Method: Stylet and Video-laryngoscopy Placement Confirmation: ETT inserted through vocal cords under direct vision,  positive ETCO2 and breath sounds checked- equal and bilateral Secured at: 21 cm Tube secured with: Tape Dental Injury: Teeth and Oropharynx as per pre-operative assessment  Comments: DL X 1 with Mac 3. Unable to see the vocal cords. Masked, DL x 1 with Glidescope #3. Grade 1 view. ATOI. Pt noted to have decreased range of motion with her neck making traditional DL difficult. ETT secured at 20 cm at the lip. + ETCO2. BBS=

## 2020-01-23 NOTE — Progress Notes (Signed)
PROGRESS NOTE    Sarah [name]  ZOX:[id number] DOB: [birth year] DOA: 01/21/2020 PCP: [name], [name], MD    Chief Complaint  Patient presents with  . Hip Pain    Brief Narrative:  Sarah [name] a 87 y.o.femalewith medical history significant forHx of bronchiectasis on chronic steroids and rotates azithromycin/Omnicefmonthly (follows with Dr. Delton CoombesByrum), CAD s/p NSTEMI, CVA, hypothyroidism, depression, HLD who presented to the ED  Due to Unwitnessed fall at [facility], Pt on floor for 2 hours. Shortening and rotation to right foot, found to have right femoral neck fracture  Subjective:  Patient is seen after return from surgery, she is sitting up in bed eating dinner, she is hard of hearing, but alert oriented x3 Daughter at bedside requesting diet to be changed to regular diet  Assessment & Plan:   Principal Problem:   Closed displaced fracture of right femoral neck (HCC) Active Problems:   Essential hypertension   Allergic rhinitis   BRONCHIECTASIS   Idiopathic peripheral neuropathy   Depression   Hypothyroidism   Arthritis   Anxiety   Malignant hypertension   TIA (transient ischemic attack)   Chronic diastolic CHF (congestive heart failure) (HCC)   HLD (hyperlipidemia)   HTN (hypertension)   GERD (gastroesophageal reflux disease)   History of CVA (cerebrovascular accident)   CAD (coronary artery disease)   Age-related physical debility   Bilateral hearing loss   Chronic kidney disease, stage 3 unspecified (HCC)   Generalized anxiety disorder   Iron deficiency anemia   Vitamin D deficiency  Displaced right femoral neck fracture -Status post right hip hemiarthroplasty, anterior approach on January 9 by Dr. Linna CapriceSwinteck -Postop pain control per Ortho daughter said, pt has had reactions to codeine and oxy, did okay with fentanyl, Tylenol and tramadol -She is on aspirin twice a day for DVT prophylaxis per Ortho recommendation  Leukocytosis, appear chronic, likely due  to steroid use, no fever   bronchiectasis and chronic cough on chronic steroids, rotates azithromycin/Omnicefmonthly   chest x-ray no acute infiltrate, showed Stable irregular nodule in the right upper lobe. Continue home regimen, chest vest, nebulizer, Mucinex Daughter who is a retired Buyer, retailrespiratory therapist from [employer] report patient at baseline is not oxygen dependent, her oxygen stays above 92%  Acute hypoxic respiratory failure :after the fall , patient developed hypoxia requiring oxygen, perhaps patient has mucous plugging due to staying on the floor for 2 hours not able to get up, daughter report during hip surgery and anesthesiology suction her very well, daughter declined the need for chest rest right now, stated flutter well should be enough for now  Hypertension: Continue Coreg and Norvasc  CAD, no chest pain, continue Coreg, Plavix  History of CVA, on Plavix  Vitamin D deficiency; continue supplement  Moderate size hiatal hernia/GERD Continue PPI twice daily  Hypothyroidism: Continue Synthroid  Generalized anxiety disorder/depression Continue home medication   DVT prophylaxis: SCDs Start: 01/23/20 1457   Code Status: DNR Family Communication: Daughter at bedside Disposition:   Status is: Inpatient  Dispo: The [facility] is [facility]              Anticipated d/c is to: Daughter declined SNF placement due to recent COVID situation, daughter will talk to social worker regarding discharge options              Anticipated d/c date is: 24 to 48 hours, need Ortho clearance  Consultants:   Orthopedic  Procedures:  Status post right hip hemiarthroplasty, anterior approach on January 9 by Dr. Linna Caprice  Antimicrobials:   Perioperative Ancef Antibiotics Given (last 72 hours)    Date/Time Action Medication Dose   01/23/20 1116 Given   ceFAZolin (ANCEF) IVPB 2g/100 mL premix 2 g          Objective: Vitals:    01/23/20 1415 01/23/20 1430 01/23/20 1459 01/23/20 1611  BP: 129/66 118/67  121/69  Pulse: (!) 102 97 90 94  Resp: (!) 24 15 18 14   Temp: 98.2 F (36.8 C)  98.1 F (36.7 C) 98.1 F (36.7 C)  TempSrc:   Oral Oral  SpO2: 93% 94% 93% 94%  Weight:      Height:        Intake/Output Summary (Last 24 hours) at 01/23/2020 1627 Last data filed at 01/23/2020 1415 Gross per 24 hour  Intake 2133.16 ml  Output 325 ml  Net 1808.16 ml   Filed Weights   01/21/20 1454  Weight: 54.4 kg    Examination:  General exam: Frail elderly, hard of hearing, AAOx3 Respiratory system: Crackles, no wheezing, no rhonchi. Respiratory effort normal. Cardiovascular system: S1 & S2 heard, RRR. No JVD, no murmur, No pedal edema. Gastrointestinal system: Abdomen is nondistended, soft and nontender. Normal bowel sounds heard. Central nervous system: Alert and oriented. No focal neurological deficits. Extremities: Right hip postop changes Skin: No rashes, lesions or ulcers Psychiatry: Judgement and insight appear normal. Mood & affect appropriate.     Data Reviewed: I have personally reviewed following labs and imaging studies  CBC: Recent Labs  Lab 01/21/20 1338 01/22/20 0423 01/23/20 0314  WBC 15.4* 11.7* 11.7*  NEUTROABS 14.1*  --  9.5*  HGB 16.4* 14.3 13.7  HCT 52.7* 45.2 43.7  MCV 103.1* 103.9* 105.3*  PLT 277 248 191    Basic Metabolic Panel: Recent Labs  Lab 01/21/20 1338 01/22/20 0423 01/23/20 0314  NA 136 138 138  K 4.8 4.4 4.2  CL 95* 99 100  CO2 32 29 30  GLUCOSE 153* 91 75  BUN 14 14 13   CREATININE 1.03* 0.89 0.81  CALCIUM 8.7* 8.4* 8.5*    GFR: Estimated Creatinine Clearance: 34.9 mL/min (by C-G formula based on SCr of 0.81 mg/dL).  Liver Function Tests: Recent Labs  Lab 01/22/20 0423  ALBUMIN 3.4*    CBG: No results for input(s): GLUCAP in the last 168 hours.   Recent Results (from the past 240 hour(s))  SARS CORONAVIRUS 2 (TAT 6-24 HRS) Nasopharyngeal  Nasopharyngeal Swab     Status: None   Collection Time: 01/21/20  1:38 PM   Specimen: Nasopharyngeal Swab  Result Value Ref Range Status   SARS Coronavirus 2 NEGATIVE NEGATIVE Final    Comment: (NOTE) SARS-CoV-2 target nucleic acids are NOT DETECTED.  The SARS-CoV-2 RNA is generally detectable in upper and lower respiratory specimens during the acute phase of infection. Negative results do not preclude SARS-CoV-2 infection, do not rule out co-infections with other pathogens, and should not be used as the sole basis for treatment or other patient management decisions. Negative results must be combined with clinical observations, patient history, and epidemiological information. The expected result is Negative.  Fact Sheet for Patients: 03/21/20  Fact Sheet for Healthcare Providers: 03/20/20  This test is not yet approved or cleared by the [place] FDA and  has been authorized for detection and/or diagnosis of SARS-CoV-2 by FDA under an Emergency Use Authorization (EUA). This EUA  will remain  in effect (meaning this test can be used) for the duration of the COVID-19 declaration under Se ction 564(b)(1) of the Act, 21 U.S.C. section 360bbb-3(b)(1), unless the authorization is terminated or revoked sooner.  Performed at [facility], [address], [place], [place] [address]   Culture, Urine     Status: Abnormal   Collection Time: 01/21/20  6:34 PM   Specimen: Urine, Catheterized  Result Value Ref Range Status   Specimen Description   Final    URINE, CATHETERIZED Performed at [facility], [address], [place], [place] [address]    Special Requests   Final    NONE Performed at [facility], [address], [place], [place] [address]    Culture (A)  Final    <10,000 COLONIES/mL INSIGNIFICANT GROWTH Performed at [facility], [address],  [place], [place] [address]    Report Status 01/23/2020 FINAL  Final  Surgical pcr screen     Status: None   Collection Time: 01/23/20  9:34 AM   Specimen: Nasal Mucosa; Nasal Swab  Result Value Ref Range Status   MRSA, PCR NEGATIVE NEGATIVE Final   Staphylococcus aureus NEGATIVE NEGATIVE Final    Comment: (NOTE) The Xpert SA Assay (FDA approved for NASAL specimens in patients 36 years of age and older), is one component of a comprehensive surveillance program. It is not intended to diagnose infection nor to guide or monitor treatment. Performed at [facility], [address] [place], [place] [address]          Radiology Studies: DG Ribs Bilateral W/Chest  Result Date: 01/22/2020 CLINICAL DATA:  87 year old female with shortness of breath status post fall. EXAM: BILATERAL RIBS AND CHEST - 4+ VIEW COMPARISON:  01/21/2020 FINDINGS: No fracture or other bone lesions are seen involving the ribs. There is no evidence of pneumothorax or pleural effusion. Similar appearing perifissural nodular opacity in the right upper lobe. Similar appearing moderate hiatal hernia. Heart size and mediastinal contours are unchanged. IMPRESSION: 1. No evidence of acute rib fracture. 2. Similar appearing right upper lobe perifissural nodular opacity. 3. Unchanged moderate hiatal hernia. Electronically Signed   By: Marliss Coots MD   On: 01/22/2020 10:25   Pelvis Portable  Result Date: 01/23/2020 CLINICAL DATA:  Status post RIGHT hip hemiarthroplasty EXAM: PORTABLE PELVIS 1-2 VIEWS COMPARISON:  None. FINDINGS: RIGHT hip arthroplasty hardware appears intact and appropriately positioned. Osseous alignment is anatomic. Expected postsurgical changes of the overlying soft tissues. IMPRESSION: Status post RIGHT hip arthroplasty. No evidence of surgical complicating feature. Electronically Signed   By: Bary Richard M.D.   On: 01/23/2020 14:45   DG C-Arm 1-60 Min-No Report  Result Date:  01/23/2020 Fluoroscopy was utilized by the requesting physician.  No radiographic interpretation.   DG HIP OPERATIVE UNILAT W OR W/O PELVIS RIGHT  Result Date: 01/23/2020 CLINICAL DATA:  RIGHT hemiarthroplasty EXAM: OPERATIVE RIGHT HIP (WITH PELVIS IF PERFORMED) 2 VIEWS TECHNIQUE: Fluoroscopic spot image(s) were submitted for interpretation post-operatively. COMPARISON:  None. FINDINGS: Fluoroscopic spot images of the RIGHT hip show RIGHT hip hemiarthroplasty hardware. Hardware appears intact and appropriately positioned. Fluoroscopy provided for 4 seconds. IMPRESSION: Intraoperative images demonstrating RIGHT hip hemiarthroplasty hardware. No evidence of surgical complicating feature. Electronically Signed   By: Bary Richard M.D.   On: 01/23/2020 13:18        Scheduled Meds: . acetaminophen  500 mg Oral TID  . albuterol  2.5 mg  Nebulization BID  . ALPRAZolam  0.5 mg Oral Q12H  . amLODipine  2.5 mg Oral QPM  . aspirin  81 mg Oral BID WC  . Brinzolamide-Brimonidine  1 drop Left Eye TID  . carvedilol  6.25 mg Oral BID WC  . Chlorhexidine Gluconate Cloth  6 each Topical Daily  . [START ON 01/24/2020] clopidogrel  75 mg Oral Daily  . docusate sodium  100 mg Oral BID  . DULoxetine  20 mg Oral TID  . escitalopram  5 mg Oral Daily  . feeding supplement  237 mL Oral BID BM  . guaiFENesin  1,200 mg Oral BID  . latanoprost  1 drop Both Eyes QHS  . levothyroxine  50 mcg Oral Daily  . [START ON 01/24/2020] multivitamin with minerals  1 tablet Oral Daily  . pantoprazole  40 mg Oral BID  . predniSONE  10 mg Oral Q breakfast   Continuous Infusions: .  ceFAZolin (ANCEF) IV       LOS: 2 days   Time spent: 25 mins Greater than 50% of this time was spent in counseling, explanation of diagnosis, planning of further management, and coordination of care.  I have personally reviewed and interpreted on  01/23/2020 daily labs,  imagings as discussed above under date review session and assessment and  plans.  I reviewed all nursing notes, pharmacy notes, consultant notes,  vitals, pertinent old records  I have discussed plan of care as described above with RN , patient and family on 01/23/2020  Voice Recognition /Dragon dictation system was used to create this note, attempts have been made to correct errors. Please contact the author with questions and/or clarifications.   Albertine Grates, MD PhD FACP [facility]  Available via Epic secure chat 7am-7pm for nonurgent issues Please page for urgent issues To page the attending provider between 7A-7P or the covering provider during after hours 7P-7A, please log into the web site [email or web address] and access using universal [place] password for that web site. If you do not have the password, please call the hospital operator.    01/23/2020, 4:27 PM

## 2020-01-23 NOTE — Progress Notes (Addendum)
Pharmacy Brief Note:  Pt underwent right hip hemiarthroplasty, anterior approach on 01/23/20. Pharmacy consulted to resume clopidogrel on POD#1, no bridging necessary.   Clopidogrel 75 mg PO daily home dose is already ordered. Holding todays dose. Resume tomorrow.  Pharmacy to sign off.   Cindi Carbon, PharmD 01/23/20 12:53 PM

## 2020-01-23 NOTE — Progress Notes (Signed)
Initial Nutrition Assessment  INTERVENTION:   -Ensure Surgery po BID, each supplement provides 330 kcal and 18 grams of protein -Multivitamin with minerals daily  NUTRITION DIAGNOSIS:   Increased nutrient needs related to hip fracture as evidenced by estimated needs.  GOAL:   Patient will meet greater than or equal to 90% of their needs  MONITOR:   Diet advancement,Labs,Weight trends,I & O's  REASON FOR ASSESSMENT:   Consult Hip fracture protocol  ASSESSMENT:   87 year old female history of chronic shortness of breath/bronchiectasis being followed by pulmonary, GERD, diastolic dysfunction, history of hiatal hernia who presented to the ED after mechanical fall and noted to have a right femoral neck fracture.  Orthopedics consulted and patient for hip repair hopefully 01/23/2020.  Pt consumed 25-75% of meals 1/8. Now NPO for possible hip surgery today. Will order Ensure supplements and daily MVI to aid in post-op healing.  Per weight records, pt with insignificant weight changes PTA.  Labs reviewed. Medications: Niferex   NUTRITION - FOCUSED PHYSICAL EXAM:  Unable to complete  Diet Order:   Diet Order            Diet NPO time specified  Diet effective now                 EDUCATION NEEDS:   No education needs have been identified at this time  Skin:  Skin Assessment: Reviewed RN Assessment  Last BM:  1/6  Height:   Ht Readings from Last 1 Encounters:  01/21/20 5\' 4"  (1.626 m)    Weight:   Wt Readings from Last 1 Encounters:  01/21/20 54.4 kg   BMI:  Body mass index is 20.6 kg/m.  Estimated Nutritional Needs:   Kcal:  1400-1600  Protein:  65-80g  Fluid:  1.6L/day  03/20/20, MS, RD, LDN Inpatient Clinical Dietitian Contact information available via Amion

## 2020-01-23 NOTE — Progress Notes (Signed)
Pt refused to do vest therapy due to rib and hip pain. Rt gave pt flutter valve. Pt understands how to use. Family at bedside.

## 2020-01-23 NOTE — OR Nursing (Signed)
Pt. Arrived to OR with an L-Shaped skin tear on right forearm, 3" in total length. Site was irrigated with betadine and NaCl, dressings applied were: NaCl soaked telfa with 4x4 gauze and wrapped in 5' of kerlex.  PACU Nurse was notified of site.  Laurance Flatten, 01/23/20; 1300

## 2020-01-23 NOTE — Op Note (Signed)
OPERATIVE REPORT  SURGEON: Samson Frederic, MD   ASSISTANT: Barrie Dunker, PA-C  PREOPERATIVE DIAGNOSIS: Displaced Right femoral neck fracture.   POSTOPERATIVE DIAGNOSIS: Displaced Right femoral neck fracture.   PROCEDURE: Right hip hemiarthroplasty, anterior approach.   IMPLANTS: DePuy Tri Lock stem, size 4, std offset, with a +0 mm spacer and a 45 mm monopolar head ball.  ANESTHESIA:  General  ANTIBIOTICS: 2g ancef.  ESTIMATED BLOOD LOSS:-100 mL    DRAINS: None.  COMPLICATIONS: None   CONDITION: PACU - hemodynamically stable.   BRIEF CLINICAL NOTE: Sarah [name] is a 87 y.o. female with a displaced Right femoral neck fracture. The patient was admitted to the hospitalist service and underwent perioperative risk stratification and medical optimization. The risks, benefits, and alternatives to hemiarthroplasty were explained, and the patient elected to proceed.  PROCEDURE IN DETAIL: The patient was taken to the operating room and general anesthesia was induced on the hospital bed.  The patient was then positioned on the Hana table.  All bony prominences were well padded.  The hip was prepped and draped in the normal sterile surgical fashion.  A time-out was called verifying side and site of surgery. Antibiotics were given within 60 minutes of beginning the procedure.   The direct anterior approach to the hip was performed through the Hueter interval.  Lateral femoral circumflex vessels were treated with the Auqumantys. The anterior capsule was exposed and an inverted T capsulotomy was made.  Fracture hematoma was encountered and evacuated. The patient was found to have a comminuted Right subcapital femoral neck fracture.  I freshened the femoral neck cut with a saw.  I removed the femoral neck fragment.  A corkscrew was placed into the head and the head was removed.  This was passed to the back table and was measured.   Acetabular exposure was achieved.  I examined the articular  cartilage which was intact.  The labrum was intact. A 45 mm trial head was placed and found to have excellent fit.   I then gained femoral exposure taking care to protect the abductors and greater trochanter.  This was performed using standard external rotation, extension, and adduction.  The capsule was peeled off the inner aspect of the greater trochanter, taking care to preserve the short external rotators. A cookie cutter was used to enter the femoral canal, and then the femoral canal finder was used to confirm location.  I then sequentially broached up to a size 4.  Calcar planer was used on the femoral neck remnant.  I paced a std neck and a 36 + 1.5 head ball. The hip was reduced.  Leg lengths were checked fluoroscopically.  The hip was dislocated and trial components were removed.  I placed the real stem followed by the real spacer and head ball.  A single reduction maneuver was performed and the hip was reduced.  Fluoroscopy was used to confirm component position and leg lengths.  At 90 degrees of external rotation and extension, the hip was stable to an anterior directed force.   The wound was copiously irrigated with Irrisept solution and normal saline using pule lavage.  Marcaine solution was injected into the periarticular soft tissue.  The wound was closed in layers using #1 Vicryl and V-Loc for the fascia, 2-0 Vicryl for the subcutaneous fat, 2-0 Monocryl for the deep dermal layer, and staples plus glue for the skin.  Once the glue was fully dried, an Aquacell Ag dressing was applied.  The patient was then awakened  from anesthesia and transported to the recovery room in stable condition.  Sponge, needle, and instrument counts were correct at the end of the case x2.  The patient tolerated the procedure well and there were no known complications.  Please note that a surgical assistant was a medical necessity for this procedure to perform it in a safe and expeditious manner. Assistant was necessary  to provide appropriate retraction of vital neurovascular structures, to prevent femoral fracture, and to allow for anatomic placement of the prosthesis.  Postoperatively, the patient be readmitted to the hospitalist service.  Weightbearing as tolerated right lower extremity with a walker.  Mobilize out of bed with PT/OT.  For DVT prophylaxis, Plavix will be continued, and we will begin aspirin 81 mg p.o. twice daily with meals for 6 weeks.  Patient will undergo disposition planning.  Return to the office in 2 weeks for suture removal.

## 2020-01-23 NOTE — Plan of Care (Signed)
  Problem: Coping: Goal: Level of anxiety will decrease Outcome: Progressing   Problem: Elimination: Goal: Will not experience complications related to urinary retention Outcome: Progressing   Problem: Pain Managment: Goal: General experience of comfort will improve Outcome: Progressing   

## 2020-01-24 DIAGNOSIS — S72001A Fracture of unspecified part of neck of right femur, initial encounter for closed fracture: Secondary | ICD-10-CM | POA: Diagnosis not present

## 2020-01-24 LAB — BASIC METABOLIC PANEL
Anion gap: 7 (ref 5–15)
BUN: 17 mg/dL (ref 8–23)
CO2: 29 mmol/L (ref 22–32)
Calcium: 8.1 mg/dL — ABNORMAL LOW (ref 8.9–10.3)
Chloride: 98 mmol/L (ref 98–111)
Creatinine, Ser: 0.89 mg/dL (ref 0.44–1.00)
GFR, Estimated: 59 mL/min — ABNORMAL LOW (ref >60)
Glucose, Bld: 158 mg/dL — ABNORMAL HIGH (ref 70–99)
Potassium: 4.3 mmol/L (ref 3.5–5.1)
Sodium: 134 mmol/L — ABNORMAL LOW (ref 135–145)

## 2020-01-24 LAB — CBC
HCT: 37.9 % (ref 36.0–46.0)
Hemoglobin: 12 g/dL (ref 12.0–15.0)
MCH: 32.3 pg (ref 26.0–34.0)
MCHC: 31.7 g/dL (ref 30.0–36.0)
MCV: 102.2 fL — ABNORMAL HIGH (ref 80.0–100.0)
Platelets: 174 10*3/uL (ref 150–400)
RBC: 3.71 MIL/uL — ABNORMAL LOW (ref 3.87–5.11)
RDW: 11.7 % (ref 11.5–15.5)
WBC: 13.7 10*3/uL — ABNORMAL HIGH (ref 4.0–10.5)
nRBC: 0 % (ref 0.0–0.2)

## 2020-01-24 NOTE — Progress Notes (Signed)
    Subjective:  Patient reports pain as mild to moderate. She states that she is much more comfortable than she was prior to surgery. Denies N/V/CP/SOB.   Objective:   VITALS:   Vitals:   01/23/20 2048 01/24/20 0111 01/24/20 0540 01/24/20 0742  BP: (!) 99/54 101/74 134/74   Pulse: 62 73 80   Resp: 17 16 16    Temp: (!) 97 F (36.1 C) (!) 97.5 F (36.4 C) (!) 97.5 F (36.4 C)   TempSrc: Axillary Oral Oral   SpO2: 98% 99% 97% 97%  Weight:      Height:        NAD ABD soft Neurovascular intact Sensation intact distally Intact pulses distally Dorsiflexion/Plantar flexion intact Incision: dressing C/D/I   Lab Results  Component Value Date   WBC 13.7 (H) 01/24/2020   HGB 12.0 01/24/2020   HCT 37.9 01/24/2020   MCV 102.2 (H) 01/24/2020   PLT 174 01/24/2020   BMET    Component Value Date/Time   NA 134 (L) 01/24/2020 0337   NA 139 03/25/2017 1216   K 4.3 01/24/2020 0337   CL 98 01/24/2020 0337   CO2 29 01/24/2020 0337   GLUCOSE 158 (H) 01/24/2020 0337   BUN 17 01/24/2020 0337   BUN 14 03/25/2017 1216   CREATININE 0.89 01/24/2020 0337   CALCIUM 8.1 (L) 01/24/2020 0337   GFRNONAA 59 (L) 01/24/2020 0337   GFRAA >60 07/09/2019 0559     Assessment/Plan: 1 Day Post-Op   Principal Problem:   Closed displaced fracture of right femoral neck (HCC) Active Problems:   Essential hypertension   Allergic rhinitis   BRONCHIECTASIS   Idiopathic peripheral neuropathy   Depression   Hypothyroidism   Arthritis   Anxiety   Malignant hypertension   TIA (transient ischemic attack)   Chronic diastolic CHF (congestive heart failure) (HCC)   HLD (hyperlipidemia)   HTN (hypertension)   GERD (gastroesophageal reflux disease)   History of CVA (cerebrovascular accident)   CAD (coronary artery disease)   Age-related physical debility   Bilateral hearing loss   Chronic kidney disease, stage 3 unspecified (HCC)   Generalized anxiety disorder   Iron deficiency anemia    Vitamin D deficiency   WBAT with walker DVT ppx: Aspirin and Plavix, SCDs, TEDS PO pain control PT/OT Dispo: D/C pending once cleared by PT/OT     07/11/2019 01/24/2020, 9:16 AM [facility] is now [facility] [address], [place], [place] Phone: [phone] [email or web address] Facebook  [phone]

## 2020-01-24 NOTE — Evaluation (Signed)
Physical Therapy Evaluation Patient Details Name: Sarah [name] MRN: [id number] DOB: 1921-08-20 Today's Date: 01/24/2020   History of Present Illness  87 year old female who fell in her Independent Living facility and sustained a RT hip fracture. Pt s/p right hip hemiarthroplasty, anterior approach on 01/23/2020.  Clinical Impression  Patient is s/p above surgery resulting in functional limitations due to the deficits listed below (see PT Problem List).  Patient will benefit from skilled PT to increase their independence and safety with mobility to allow discharge to the venue listed below.  Pt very pleasant and agreeable to mobilize.  Pt requiring at least mod assist today for mobilizing however reports pain much improved s/p surgery.  Pt from independent living and daughter is considering d/c to [facility] for rehab pending family discussion.  Pt would benefit from SNF upon d/c to improve independence, endurance and strength prior to return home to ILF.     Follow Up Recommendations SNF    Equipment Recommendations  None recommended by PT    Recommendations for Other Services       Precautions / Restrictions Precautions Precautions: Fall Precaution Comments: no precautions - direct anterior approach Restrictions RLE Weight Bearing: Weight bearing as tolerated      Mobility  Bed Mobility Overal bed mobility: Needs Assistance Bed Mobility: Sit to Supine     Supine to sit: Min assist;HOB elevated Sit to supine: Mod assist   General bed mobility comments: Multimodal cues for technique, pt attempted to self assist however required min assist for each LE to bring onto bed    Transfers Overall transfer level: Needs assistance Equipment used: Rolling walker (2 wheeled) Transfers: Sit to/from Stand Sit to Stand: Mod assist;+2 safety/equipment Stand pivot transfers: Max assist;+2 physical assistance;From elevated surface       General transfer comment: multimodal cues for  technique including UE positioning, pt able to assist however requires increased time, posterior lean present however pt able to correct with cues and time; pt incontinent with standing so performed sit to stands x2  Ambulation/Gait Ambulation/Gait assistance: Min assist;Mod assist;+2 physical assistance Gait Distance (Feet): 18 Feet Assistive device: Rolling walker (2 wheeled) Gait Pattern/deviations: Step-to pattern;Step-through pattern;Decreased stance time - right;Antalgic Gait velocity: decr   General Gait Details: multimodal cues for sequence, step length, RW positioning, posture; pt reports little pain, very slow with mobility, remained on 1L O2 [place] and SPO2 92% during ambulation  Stairs            Wheelchair Mobility    Modified Rankin (Stroke Patients Only)       Balance Overall balance assessment: History of Falls   Sitting balance-Leahy Scale: Poor Sitting balance - Comments: Needs BUE support on bed and demonstrates posterior pelvic tilt with need of close supervision to Min guard for EOB safety. Postural control: Posterior lean Standing balance support: Bilateral upper extremity supported Standing balance-Leahy Scale: Poor Standing balance comment: reliant on UE support, initially more assist due to posterior lean however min/guard once balance obtained                             Pertinent Vitals/Pain Pain Assessment: Faces Faces Pain Scale: Hurts a little bit Pain Location: right hip with mobility Pain Descriptors / Indicators: Grimacing;Aching Pain Intervention(s): Repositioned;Monitored during session    Home Living Family/patient expects to be discharged to:: Skilled nursing facility   Available Help at Discharge: Family;Available PRN/intermittently Type of Home: Independent living facility  Home Layout: One level Home Equipment: Walker - 4 wheels;Cane - single point;Shower seat;Grab bars - tub/shower;Grab bars - toilet;Adaptive  equipment Additional Comments: Pt lives at [facility] facility.    Prior Function Level of Independence: Independent with assistive device(s);Needs assistance   Gait / Transfers Assistance Needed: rollator vs cane for ambulation.  ADL's / Homemaking Assistance Needed: per family/pt, bathing/dressing independently.  Comments: Caregiver comes every Tuesday and performs housekeeping and laundry. Caregiver also brings groceries. Pt dines in the dining room or heats up simple frozen meals.     Hand Dominance   Dominant Hand: Right    Extremity/Trunk Assessment   Upper Extremity Assessment Upper Extremity Assessment: Overall WFL for tasks assessed    Lower Extremity Assessment Lower Extremity Assessment: Generalized weakness;RLE deficits/detail;LLE deficits/detail RLE Deficits / Details: pt reports pain much improved since surgery, anticipated hip weakness observed LLE Deficits / Details: reports buckling of left knee prior to admission    Cervical / Trunk Assessment Cervical / Trunk Assessment: Kyphotic;Other exceptions Cervical / Trunk Exceptions: forward head posture  Communication   Communication: No difficulties  Cognition Arousal/Alertness: Awake/alert Behavior During Therapy: WFL for tasks assessed/performed Overall Cognitive Status: History of cognitive impairments - at baseline                                 General Comments: Pt with difficulty staying on topic and requires redirection for safety.  Pt's daughter in room and reports this is baseline. Pt often repeats self indicating some memory loss.      General Comments      Exercises     Assessment/Plan    PT Assessment Patient needs continued PT services  PT Problem List Decreased strength;Decreased mobility;Decreased balance;Decreased activity tolerance;Decreased knowledge of use of DME;Pain;Cardiopulmonary status limiting activity       PT Treatment Interventions DME  instruction;Gait training;Balance training;Therapeutic exercise;Functional mobility training;Therapeutic activities;Patient/family education    PT Goals (Current goals can be found in the Care Plan section)  Acute Rehab PT Goals Patient Stated Goal: To "get back to it." PT Goal Formulation: With patient/family Time For Goal Achievement: 02/07/20 Potential to Achieve Goals: Good    Frequency Min 3X/week   Barriers to discharge        Co-evaluation               AM-PAC PT "6 Clicks" Mobility  Outcome Measure Help needed turning from your back to your side while in a flat bed without using bedrails?: A Lot Help needed moving from lying on your back to sitting on the side of a flat bed without using bedrails?: A Lot Help needed moving to and from a bed to a chair (including a wheelchair)?: A Lot Help needed standing up from a chair using your arms (e.g., wheelchair or bedside chair)?: A Lot Help needed to walk in hospital room?: A Lot Help needed climbing 3-5 steps with a railing? : Total 6 Click Score: 11    End of Session Equipment Utilized During Treatment: Gait belt;Oxygen Activity Tolerance: Patient tolerated treatment well Patient left: in bed;with call bell/phone within reach;with family/visitor present Nurse Communication: Mobility status PT Visit Diagnosis: Unsteadiness on feet (R26.81);Difficulty in walking, not elsewhere classified (R26.2)    Time: 4680-3212 PT Time Calculation (min) (ACUTE ONLY): 44 min   Charges:   PT Evaluation $PT Eval Low Complexity: 1 Low PT Treatments $Gait Training: 23-37 mins  Thomasene Mohair PT, DPT Acute Rehabilitation Services Pager: [phone] Office: [phone]  Sarajane Jews 01/24/2020, 1:32 PM

## 2020-01-24 NOTE — Care Management Important Message (Signed)
Important Message  Patient Details IM Letter given to the Patient. Name: Sarah [name] MRN: [id number] Date of Birth: 1921/12/28   Medicare Important Message Given:  Yes     Caren Macadam 01/24/2020, 12:52 PM

## 2020-01-24 NOTE — TOC Initial Note (Signed)
Transition of Care [facility]) - Initial/Assessment Note    Patient Details  Name: Sarah [name] MRN: [id number] Date of Birth: [birth year]  Transition of [facility]) CM/SW Contact:    Amada Jupiter, LCSW Phone Number: 01/24/2020, 4:44 PM  Clinical Narrative:                 Met briefly with pt and have spoken with daughter, Sharyl Nimrod, via phone to review dc planning needs.  Pt has been a resident at [facility] [place] for ~ 4 years and managing well.  Now with a fall/ fx/ s/p surgery and discussion now about whether best to return to IL or consider SNF for rehab.  Therapies have recommended short term SNF and daughter notes that pt has been to [facility] before and had a good experience.  Daughter to discuss with pt and her sister and will confirm dc plan in the morning.    Expected Discharge Plan: Skilled Nursing Facility Barriers to Discharge: Continued Medical Work up   Patient Goals and CMS Choice Patient states their goals for this hospitalization and ongoing recovery are:: to eventually return to her [place] apt at [facility]      Expected Discharge Plan and Services Expected Discharge Plan: Skilled Nursing Facility In-house Referral: Clinical Social Work     Living arrangements for the past 2 months: Independent Living Facility                                      Prior Living Arrangements/Services Living arrangements for the past 2 months: Independent Living Facility Lives with:: Facility Resident Patient language and need for interpreter reviewed:: Yes Do you feel safe going back to the place where you live?: Yes      Need for Family Participation in Patient Care: No (Comment) Care giver support system in place?: Yes (comment)   Criminal Activity/Legal Involvement Pertinent to Current Situation/Hospitalization: No - Comment as needed  Activities of Daily Living Home Assistive Devices/Equipment: Hearing aid,Eyeglasses,Cane (specify quad or straight),Walker (specify  type),Shower chair with back (bilateral hearing aides,) ADL Screening (condition at time of admission) Patient's cognitive ability adequate to safely complete daily activities?: Yes Is the patient deaf or have difficulty hearing?: Yes (wears bilateral hearing aides) Does the patient have difficulty seeing, even when wearing glasses/contacts?: Yes (macular degeneration in both eyes) Does the patient have difficulty concentrating, remembering, or making decisions?: No Patient able to express need for assistance with ADLs?: No Does the patient have difficulty dressing or bathing?: No Independently performs ADLs?: No Communication: Independent Dressing (OT): Needs assistance Is this a change from baseline?: Change from baseline, expected to last >3 days Grooming: Independent Feeding: Independent Bathing: Needs assistance Is this a change from baseline?: Change from baseline, expected to last >3 days Toileting: Needs assistance Is this a change from baseline?: Change from baseline, expected to last >3days In/Out Bed: Needs assistance Is this a change from baseline?: Change from baseline, expected to last >3 days Walks in Home: Needs assistance Is this a change from baseline?: Change from baseline, expected to last >3 days Does the patient have difficulty walking or climbing stairs?: Yes Weakness of Legs: Right Weakness of Arms/Hands: None  Permission Sought/Granted Permission sought to share information with : Family Electrical engineer Permission granted to share information with : Yes, Verbal Permission Granted  Share Information with NAME: Kendall Flack     Permission granted to share info  w Relationship: daughter  Permission granted to share info w Contact Information: [phone]  Emotional Assessment Appearance:: Appears stated age Attitude/Demeanor/Rapport: Gracious Affect (typically observed): Accepting Orientation: : Oriented to Self,Oriented to  Place,Oriented to  Time,Oriented to Situation Alcohol / Substance Use: Not Applicable Psych Involvement: No (comment)  Admission diagnosis:  Pain [R52] Closed fracture of neck of right femur, initial encounter ([place]) [S72.001A] Fall, initial encounter [W19.XXXA] Closed displaced fracture of right femoral neck ([place]) [S72.001A] Patient Active Problem List   Diagnosis Date Noted  . Rib pain   . Closed displaced fracture of right femoral neck ([place]) 01/21/2020  . Acute non-ST segment elevation myocardial infarction ([place]) 01/05/2020  . Debility 01/05/2020  . Age-related physical debility 01/05/2020  . Aneurysm of carotid artery ([place]) 01/05/2020  . Bilateral hearing loss 01/05/2020  . Bronchiolectasis ([place]) 01/05/2020  . Chronic kidney disease, stage 3 unspecified ([place]) 01/05/2020  . Deficiency of macronutrients 01/05/2020  . Protein calorie malnutrition ([place]) 01/05/2020  . Diaphragmatic hernia 01/05/2020  . Dysphagia 01/05/2020  . Edema of lower extremity 01/05/2020  . Generalized anxiety disorder 01/05/2020  . Gingivostomatitis 01/05/2020  . Hyperglycemia 01/05/2020  . Hypertensive retinopathy 01/05/2020  . Impulse control disorder 01/05/2020  . Trichotillomania 01/05/2020  . Iron deficiency anemia 01/05/2020  . Lung mass 01/05/2020  . Lymphedema 01/05/2020  . Macular degeneration 01/05/2020  . Major depression single episode, in partial remission ([place]) 01/05/2020  . Migraine 01/05/2020  . Mycobacteriosis 01/05/2020  . Other specified disorders of bone density and structure, other site 01/05/2020  . Prediabetes 01/05/2020  . Recurrent major depression ([place]) 01/05/2020  . Retinal drusen 01/05/2020  . Rosacea 01/05/2020  . Sciatica 01/05/2020  . Senile purpura ([place]) 01/05/2020  . Tear film insufficiency 01/05/2020  . Vitamin D deficiency 01/05/2020  . Pseudomonas aeruginosa colonization 07/23/2019  . Aspiration pneumonia of both lungs ([place]) 07/23/2019  . Acute respiratory  failure with hypoxia ([place]) 07/02/2019  . History of CVA (cerebrovascular accident) 07/02/2019  . CAD (coronary artery disease) 07/02/2019  . Glaucoma 07/02/2019  . S/P thoracentesis   . Pleural effusion   . Pneumonia of left lung due to infectious organism 12/16/2018  . Community acquired pneumonia 12/16/2018  . HCAP (healthcare-associated pneumonia) 12/16/2018  . Leukocytosis   . Hemoptysis 03/16/2018  . GERD (gastroesophageal reflux disease) 03/19/2017  . Nausea vomiting and diarrhea   . Gastroenteritis due to norovirus 01/25/2017  . Adrenal insufficiency ([place]) 03/21/2015  . HTN (hypertension) 02/14/2015  . Fecal impaction ([place]) 10/07/2014  . Colitis 10/06/2014  . Cerebral infarction due to thrombosis of basilar artery ([place]) 09/09/2014  . Aneurysm, cerebral, nonruptured 09/09/2014  . HLD (hyperlipidemia) 09/09/2014  . Acute ischemic stroke ([place]) 07/09/2014  . Chronic diastolic CHF (congestive heart failure) ([place]) 07/09/2014  . Stroke ([place]) 07/08/2014  . TIA (transient ischemic attack) 07/08/2014  . Chest pain 04/15/2012  . Shortness of breath dyspnea 11/23/2011  . Anxiety 11/23/2011  . Malignant hypertension 11/23/2011  . Elevated troponin 06/20/2011  . Depression   . Hypothyroidism   . Arthritis   . Racing heart beat 02/11/2011  . NSTEMI (non-ST elevated myocardial infarction) ([place]) 09/24/2010  . Idiopathic peripheral neuropathy   . Pollen allergies   . BRONCHIECTASIS 07/29/2008  . Essential hypertension 06/15/2008  . Allergic rhinitis 06/15/2008  . Asthma 06/15/2008  . Cough 06/15/2008   PCP:  [name], MD Pharmacy:   [facility], [place] [facility] [place]  [address] [place] [address] Phone: [phone] Fax: [phone]  [id number]: [phone]: [phone]    Social Determinants  of Health (SDOH) Interventions    Readmission Risk Interventions Readmission Risk Prevention Plan 01/24/2020 12/20/2018  Transportation Screening Complete Complete  PCP or Specialist Appt within 5-7 Days Complete -  Some recent data might be hidden

## 2020-01-24 NOTE — Progress Notes (Signed)
PROGRESS NOTE    Sarah [name]  TKP:[id number] DOB: 04/08/1921 DOA: 01/21/2020 PCP: [name], MD   Chief Complain: Right hip pain  Brief Narrative: Patient is a 87 year old female with history of bronchiectasis on chronic steroids, chronic antibiotic therapy, coronary artery disease status , nonhemorrhagic CVA, hypothyroidism, depression, hyperlipidemia who presents to the emergency department with complaints of pain in the right hip after she had unwitnessed fall at independent living facility.  Imaging showed right femoral neck fracture.  Underwent ORIF by orthopedics.  Waiting for PT/OT evaluation.  Possible plan for skilled nursing still placement.   Assessment & Plan:   Principal Problem:   Closed displaced fracture of right femoral neck (HCC) Active Problems:   Essential hypertension   Allergic rhinitis   BRONCHIECTASIS   Idiopathic peripheral neuropathy   Depression   Hypothyroidism   Arthritis   Anxiety   Malignant hypertension   TIA (transient ischemic attack)   Chronic diastolic CHF (congestive heart failure) (HCC)   HLD (hyperlipidemia)   HTN (hypertension)   GERD (gastroesophageal reflux disease)   History of CVA (cerebrovascular accident)   CAD (coronary artery disease)   Age-related physical debility   Bilateral hearing loss   Chronic kidney disease, stage 3 unspecified (HCC)   Generalized anxiety disorder   Iron deficiency anemia   Vitamin D deficiency   Displaced right femoral neck fracture: Status post right hip hemiarthroplasty by orthopedics on January 9.  Continue pain management and supportive care.  Started on aspirin twice a day for DVT prophylaxis  Acute hypoxic respiratory failure: He denies any cough or shortness of breath today.  Requiring 2 L of oxygen per minute.  Will continue to wean and stop the oxygen.  She is not on oxygen at home.  Continue incentive spirometry. Chest x-ray did not show any pneumonia, pneumothorax or rib  fracture.  History of bronchiectasis: On chronic steroid and antibiotic therapy.  Follows with pulmonology as an outpatient.  Continue home regimen, chest vest, bronchodilators as needed, Mucinex.  Leukocytosis: Chronic.  Stable.  Likely secondary to steroid use  Hypertension: Currently blood stable.  Continue Coreg, Norvasc  Coronary artery disease: denies any chest pain.  On Plavix, Coreg at home.  History of nonhemorrhagic CVA: Ambulates with the help of walker.  On Plavix at home  Vitamin D deficiency: Continue supplementation  Moderate size hiatal hernia/GERD: Continue PPI twice a day.  Hypothyroidism: On Synthyroid  General anxiety disorder/depression: Continue home medications  Nutrition Problem: Increased nutrient needs Etiology: hip fracture      DVT prophylaxis:Aspirin Code Status: DNR Family Communication: daughter present at bedside Status is: Inpatient  Remains inpatient appropriate because:Unsafe d/c plan   Dispo: The patient is from:Independent living              Anticipated d/c is to: SNF              Anticipated d/c date is: 1 day              Patient currently is not medically stable to d/c.    Consultants: Ortho  Procedures:As above  Antimicrobials:  Anti-infectives (From admission, onward)   Start     Dose/Rate Route Frequency Ordered Stop   01/23/20 1800  ceFAZolin (ANCEF) IVPB 2g/100 mL premix        2 g 200 mL/hr over 30 Minutes Intravenous Every 6 hours 01/23/20 1456 01/24/20 0147   01/23/20 1004  ceFAZolin (ANCEF) 2-4 GM/100ML-% IVPB       Note  to Pharmacy: [name]   : cabinet override      01/23/20 1004 01/23/20 1137   01/23/20 1000  ceFAZolin (ANCEF) IVPB 2g/100 mL premix        2 g 200 mL/hr over 30 Minutes Intravenous To [facility] 01/23/20 0927 01/23/20 1146      Subjective:  Patient seen and examined at bedside this morning.  Hemodynamically stable.  Sitting on the chair.  Pain well controlled.  On 2 L of  oxygen per minute  Objective: Vitals:   01/23/20 2048 01/24/20 0111 01/24/20 0540 01/24/20 0742  BP: (!) 99/54 101/74 134/74   Pulse: 62 73 80   Resp: 17 16 16    Temp: (!) 97 F (36.1 C) (!) 97.5 F (36.4 C) (!) 97.5 F (36.4 C)   TempSrc: Axillary Oral Oral   SpO2: 98% 99% 97% 97%  Weight:      Height:        Intake/Output Summary (Last 24 hours) at 01/24/2020 1020 Last data filed at 01/24/2020 03/23/2020 Gross per 24 hour  Intake 2453.16 ml  Output 1000 ml  Net 1453.16 ml   Filed Weights   01/21/20 1454  Weight: 54.4 kg    Examination:  General exam: Pleasant elderly female Respiratory system: Bilateral equal air entry, normal vesicular breath sounds, no wheezes or crackles  Cardiovascular system: S1 & S2 heard, RRR. No JVD, murmurs, rubs, gallops or clicks. No pedal edema. Gastrointestinal system: Abdomen is nondistended, soft and nontender. No organomegaly or masses felt. Normal bowel sounds heard. Central nervous system: Alert and oriented. No focal neurological deficits. Extremities: No edema, no clubbing ,no cyanosis, clean surgical wound on the right hip  skin: No rashes, lesions or ulcers,no icterus ,no pallor   Data Reviewed: I have personally reviewed following labs and imaging studies  CBC: Recent Labs  Lab 01/21/20 1338 01/22/20 0423 01/23/20 0314 01/24/20 0337  WBC 15.4* 11.7* 11.7* 13.7*  NEUTROABS 14.1*  --  9.5*  --   HGB 16.4* 14.3 13.7 12.0  HCT 52.7* 45.2 43.7 37.9  MCV 103.1* 103.9* 105.3* 102.2*  PLT 277 248 191 174   Basic Metabolic Panel: Recent Labs  Lab 01/21/20 1338 01/22/20 0423 01/23/20 0314 01/24/20 0337  NA 136 138 138 134*  K 4.8 4.4 4.2 4.3  CL 95* 99 100 98  CO2 32 29 30 29   GLUCOSE 153* 91 75 158*  BUN 14 14 13 17   CREATININE 1.03* 0.89 0.81 0.89  CALCIUM 8.7* 8.4* 8.5* 8.1*   GFR: Estimated Creatinine Clearance: 31.8 mL/min (by C-G formula based on SCr of 0.89 mg/dL). Liver Function Tests: Recent Labs  Lab  01/22/20 0423  ALBUMIN 3.4*   No results for input(s): LIPASE, AMYLASE in the last 168 hours. No results for input(s): AMMONIA in the last 168 hours. Coagulation Profile: Recent Labs  Lab 01/21/20 1338  INR 0.9   Cardiac Enzymes: No results for input(s): CKTOTAL, CKMB, CKMBINDEX, TROPONINI in the last 168 hours. BNP (last 3 results) No results for input(s): PROBNP in the last 8760 hours. HbA1C: No results for input(s): HGBA1C in the last 72 hours. CBG: No results for input(s): GLUCAP in the last 168 hours. Lipid Profile: No results for input(s): CHOL, HDL, LDLCALC, TRIG, CHOLHDL, LDLDIRECT in the last 72 hours. Thyroid Function Tests: No results for input(s): TSH, T4TOTAL, FREET4, T3FREE, THYROIDAB in the last 72 hours. Anemia Panel: No results for input(s): VITAMINB12, FOLATE, FERRITIN, TIBC, IRON, RETICCTPCT in the last 72 hours. Sepsis  Labs: No results for input(s): PROCALCITON, LATICACIDVEN in the last 168 hours.  Recent Results (from the past 240 hour(s))  SARS CORONAVIRUS 2 (TAT 6-24 HRS) Nasopharyngeal Nasopharyngeal Swab     Status: None   Collection Time: 01/21/20  1:38 PM   Specimen: Nasopharyngeal Swab  Result Value Ref Range Status   SARS Coronavirus 2 NEGATIVE NEGATIVE Final    Comment: (NOTE) SARS-CoV-2 target nucleic acids are NOT DETECTED.  The SARS-CoV-2 RNA is generally detectable in upper and lower respiratory specimens during the acute phase of infection. Negative results do not preclude SARS-CoV-2 infection, do not rule out co-infections with other pathogens, and should not be used as the sole basis for treatment or other patient management decisions. Negative results must be combined with clinical observations, patient history, and epidemiological information. The expected result is Negative.  Fact Sheet for Patients: HairSlick.no  Fact Sheet for Healthcare  Providers: quierodirigir.com  This test is not yet approved or cleared by the [place] FDA and  has been authorized for detection and/or diagnosis of SARS-CoV-2 by FDA under an Emergency Use Authorization (EUA). This EUA will remain  in effect (meaning this test can be used) for the duration of the COVID-19 declaration under Se ction 564(b)(1) of the Act, 21 U.S.C. section 360bbb-3(b)(1), unless the authorization is terminated or revoked sooner.  Performed at [facility], [address], [place], [place] [address]   Culture, Urine     Status: Abnormal   Collection Time: 01/21/20  6:34 PM   Specimen: Urine, Catheterized  Result Value Ref Range Status   Specimen Description   Final    URINE, CATHETERIZED Performed at [facility], [address] [place], [place] [address]    Special Requests   Final    NONE Performed at [facility], [address], [place], [place] [address]    Culture (A)  Final    <10,000 COLONIES/mL INSIGNIFICANT GROWTH Performed at [facility], [address] [place], [place] [address]    Report Status 01/23/2020 FINAL  Final  Surgical pcr screen     Status: None   Collection Time: 01/23/20  9:34 AM   Specimen: Nasal Mucosa; Nasal Swab  Result Value Ref Range Status   MRSA, PCR NEGATIVE NEGATIVE Final   Staphylococcus aureus NEGATIVE NEGATIVE Final    Comment: (NOTE) The Xpert SA Assay (FDA approved for NASAL specimens in patients 69 years of age and older), is one component of a comprehensive surveillance program. It is not intended to diagnose infection nor to guide or monitor treatment. Performed at [facility], [address] [place], [place] [address]          Radiology Studies: Pelvis Portable  Result Date: 01/23/2020 CLINICAL DATA:  Status post RIGHT hip hemiarthroplasty EXAM: PORTABLE PELVIS 1-2 VIEWS COMPARISON:  None. FINDINGS:  RIGHT hip arthroplasty hardware appears intact and appropriately positioned. Osseous alignment is anatomic. Expected postsurgical changes of the overlying soft tissues. IMPRESSION: Status post RIGHT hip arthroplasty. No evidence of surgical complicating feature. Electronically Signed   By: Bary Richard M.D.   On: 01/23/2020 14:45   DG C-Arm 1-60 Min-No Report  Result Date: 01/23/2020 Fluoroscopy was utilized by the requesting physician.  No radiographic interpretation.   DG HIP OPERATIVE UNILAT W OR W/O PELVIS RIGHT  Result Date: 01/23/2020 CLINICAL DATA:  RIGHT hemiarthroplasty EXAM: OPERATIVE RIGHT HIP (WITH PELVIS IF PERFORMED) 2 VIEWS TECHNIQUE: Fluoroscopic spot image(s) were submitted for  interpretation post-operatively. COMPARISON:  None. FINDINGS: Fluoroscopic spot images of the RIGHT hip show RIGHT hip hemiarthroplasty hardware. Hardware appears intact and appropriately positioned. Fluoroscopy provided for 4 seconds. IMPRESSION: Intraoperative images demonstrating RIGHT hip hemiarthroplasty hardware. No evidence of surgical complicating feature. Electronically Signed   By: Bary RichardStan  Maynard M.D.   On: 01/23/2020 13:18        Scheduled Meds: . acetaminophen  500 mg Oral TID  . albuterol  2.5 mg Nebulization BID  . ALPRAZolam  0.5 mg Oral Q12H  . amLODipine  2.5 mg Oral QPM  . aspirin  81 mg Oral BID WC  . Brinzolamide-Brimonidine  1 drop Left Eye TID  . carvedilol  6.25 mg Oral BID WC  . clopidogrel  75 mg Oral Daily  . docusate sodium  100 mg Oral BID  . DULoxetine  20 mg Oral TID  . escitalopram  5 mg Oral Daily  . feeding supplement  237 mL Oral BID BM  . guaiFENesin  1,200 mg Oral BID  . latanoprost  1 drop Both Eyes QHS  . levothyroxine  50 mcg Oral Daily  . multivitamin with minerals  1 tablet Oral Daily  . pantoprazole  40 mg Oral BID  . predniSONE  10 mg Oral Q breakfast   Continuous Infusions:   LOS: 3 days    Time spent: 25 mins.More than 50% of that time was  spent in counseling and/or coordination of care.      Burnadette PopAmrit Dede Dobesh, MD [facility] P1/10/2020, 10:20 AM

## 2020-01-24 NOTE — Evaluation (Signed)
Occupational Therapy Evaluation Patient Details Name: Sarah [name] MRN: [id number] DOB: [birth year] Today's Date: 01/24/2020    History of Present Illness 87 year old female who fell in her Independent Living facility and sustained a RT hip fracture. PT s/p RT hip hemiarthroplast, anterior approach on 01/23/2020.   Clinical Impression   Patient is currently requiring assistance with ADLs including maximum with toileting, LE dressing, and with LE bathing, as well as minimal assist with bed mobility and maximum assist of 2 people to stand and pivot to recliner, all of which is below patient's typical baseline of being Modified independent.  During this evaluation, patient was limited by her recent RT hip injury and surgery, emotional trauma of waiting on floor for 2 hours, and mild confusion with hearing loss, which has the potential to impact patient's safety and independence during functional mobility, as well as performance for ADLs. [employer] AM-PAC "6-clicks" Daily Activity Inpatient Short Form score of 16/24 indicates 53.32% ADL impairment this session. Patient lives at [facility], with PRN assistance and supervision available from family and staff members.  Patient demonstrates good rehab potential, and should benefit from continued skilled occupational therapy services while in acute care to maximize safety, independence and quality of life at home.  Continued occupational therapy services in a SNF setting prior to return home is recommended.  ?    Follow Up Recommendations  SNF    Equipment Recommendations   (2 wheeled RW)    Recommendations for Other Services       Precautions / Restrictions Precautions Precautions: Anterior Hip;Fall [facility]) Precaution Booklet Issued: No Restrictions Weight Bearing Restrictions: No RLE Weight Bearing: Weight bearing as tolerated (RLE)      Mobility Bed Mobility Overal bed mobility: Needs Assistance Bed Mobility: Supine to Sit      Supine to sit: Min assist;HOB elevated     General bed mobility comments: Multimodal cues and use of bed rail.    Transfers Overall transfer level: Needs assistance Equipment used: Rolling walker (2 wheeled) Transfers: Sit to/from [employer] Sit to Stand: Max assist;From elevated surface Stand pivot transfers: Max assist;+2 physical assistance;From elevated surface       General transfer comment: Pt with difficulty keeping feet underneath her and needs cues to increase her BOS/stance to improve balance. Stand to sit into recliner required Max Assistance and pt unabel to follow cues for BUE reach back for controlled descent.    Balance Overall balance assessment: History of Falls   Sitting balance-Leahy Scale: Poor Sitting balance - Comments: Needs BUE support on bed and demonstrates posterior pelvic tilt with need of close supervision to Min guard for EOB safety. Postural control: Posterior lean   Standing balance-Leahy Scale: Poor Standing balance comment: Reliant on RW and required Max Assist to maintain standing.                           ADL either performed or assessed with clinical judgement   ADL Overall ADL's : Needs assistance/impaired Eating/Feeding: Independent;Bed level   Grooming: Set up;Bed level   Upper Body Bathing: Min guard;Bed level   Lower Body Bathing: Maximal assistance;Bed level   Upper Body Dressing : Min guard;Bed level   Lower Body Dressing: Maximal assistance;Bed level;Sitting/lateral leans     Toilet Transfer Details (indicate cue type and reason): Please refer to mobility section. Toileting- Clothing Manipulation and Hygiene: Total assistance;Bed level Toileting - Clothing Manipulation Details (indicate cue type and reason):  Currently on pure wick     Functional mobility during ADLs: Minimal assistance;Maximal assistance;Rolling walker General ADL Comments: Min Assist bed; Max assist transfers     Vision    Vision Assessment?: No apparent visual deficits     Perception     Praxis      Pertinent Vitals/Pain Pain Assessment: Faces Faces Pain Scale: Hurts a little bit Pain Location: Pt denied pain at all times, except report of "a little pain" with standing which resolved once in recliner.     Hand Dominance Right   Extremity/Trunk Assessment Upper Extremity Assessment Upper Extremity Assessment: Overall WFL for tasks assessed   Lower Extremity Assessment Lower Extremity Assessment: Defer to PT evaluation   Cervical / Trunk Assessment Cervical / Trunk Assessment: Kyphotic   Communication Communication Communication: No difficulties   Cognition Arousal/Alertness: Awake/alert Behavior During Therapy: WFL for tasks assessed/performed Overall Cognitive Status: History of cognitive impairments - at baseline                                 General Comments: Pt with difficulty staying on topic and requires redirection for safety.  Pt's daughter in room and reports this is baseline. Pt often repeats self indicating some memory loss.   General Comments       Exercises     Shoulder Instructions      Home Living Family/patient expects to be discharged to:: Skilled nursing facility   Available Help at Discharge: Family;Available PRN/intermittently Type of Home: Independent living facility       Home Layout: One level     Bathroom Shower/Tub: Producer, television/film/video: Standard Bathroom Accessibility: Yes How Accessible: Accessible via walker Home Equipment: Walker - 4 wheels;Cane - single point;Shower seat;Grab bars - tub/shower;Grab bars - toilet;Adaptive equipment Adaptive Equipment: Long-handled sponge Additional Comments: Pt lives at [facility] [employer] facility.      Prior Functioning/Environment Level of Independence: Independent with assistive device(s);Needs assistance  Gait / Transfers Assistance Needed: rollator vs cane for  ambulation. ADL's / Homemaking Assistance Needed: per family/pt, bathing/dressing independently.   Comments: Caregiver comes every Tuesday and performs housekeeping and laundry. Caregiver also brings groceries. Pt dines in the dining room or heats up simple frozen meals.        OT Problem List: Decreased strength;Decreased cognition;Decreased activity tolerance;Decreased safety awareness;Impaired balance (sitting and/or standing);Decreased knowledge of use of DME or AE;Decreased knowledge of precautions      OT Treatment/Interventions: Self-care/ADL training;Therapeutic activities;Energy conservation;DME and/or AE instruction;Patient/family education;Balance training    OT Goals(Current goals can be found in the care plan section) Acute Rehab OT Goals Patient Stated Goal: To "get back to it." OT Goal Formulation: With patient Time For Goal Achievement: 02/07/20 Potential to Achieve Goals: Good ADL Goals Pt Will Perform Lower Body Bathing: with supervision;with adaptive equipment;sitting/lateral leans;sit to/from stand Pt Will Perform Lower Body Dressing: with adaptive equipment;with set-up;sit to/from stand;sitting/lateral leans Pt Will Transfer to Toilet: with modified independence;ambulating;grab bars;bedside commode Pt Will Perform Toileting - Clothing Manipulation and hygiene: with modified independence Additional ADL Goal #1: Pt will increase sitting balance to good and standing balance to at least fair while standing at sink for grooming/hygiene.  OT Frequency: Min 2X/week   Barriers to D/C:    PRN supervision available, but not 24/7.       Co-evaluation              AM-PAC OT "6  Clicks" Daily Activity     Outcome Measure Help from another person eating meals?: None Help from another person taking care of personal grooming?: A Little Help from another person toileting, which includes using toliet, bedpan, or urinal?: A Lot Help from another person bathing (including  washing, rinsing, drying)?: A Lot Help from another person to put on and taking off regular upper body clothing?: A Little Help from another person to put on and taking off regular lower body clothing?: A Lot 6 Click Score: 16   End of Session Equipment Utilized During Treatment: Gait belt;Rolling walker Nurse Communication: Mobility status  Activity Tolerance: Patient tolerated treatment well Patient left: in chair;with call bell/phone within reach;with chair alarm set;with family/visitor present;with SCD's reapplied  OT Visit Diagnosis: Unsteadiness on feet (R26.81);History of falling (Z91.81)                Time: 0820-0908 OT Time Calculation (min): 48 min Charges:  OT General Charges $OT Visit: 1 Visit OT Evaluation $OT Eval Moderate Complexity: 1 Mod OT Treatments $Therapeutic Activity: 23-37 mins  Victorino Dike, OT Acute Rehab Services Office: [phone] 01/24/2020  Theodoro Clock 01/24/2020, 10:38 AM

## 2020-01-25 ENCOUNTER — Inpatient Hospital Stay (HOSPITAL_COMMUNITY): Payer: Medicare Other

## 2020-01-25 DIAGNOSIS — S72001A Fracture of unspecified part of neck of right femur, initial encounter for closed fracture: Secondary | ICD-10-CM | POA: Diagnosis not present

## 2020-01-25 LAB — BPAM RBC
Blood Product Expiration Date: 202202012359
Blood Product Expiration Date: 202202012359
Unit Type and Rh: 9500
Unit Type and Rh: 9500

## 2020-01-25 LAB — TYPE AND SCREEN
ABO/RH(D): O NEG
Antibody Screen: NEGATIVE
Unit division: 0
Unit division: 0

## 2020-01-25 LAB — CBC
HCT: 40.6 % (ref 36.0–46.0)
Hemoglobin: 12.4 g/dL (ref 12.0–15.0)
MCH: 33.1 pg (ref 26.0–34.0)
MCHC: 30.5 g/dL (ref 30.0–36.0)
MCV: 108.3 fL — ABNORMAL HIGH (ref 80.0–100.0)
Platelets: 142 10*3/uL — ABNORMAL LOW (ref 150–400)
RBC: 3.75 MIL/uL — ABNORMAL LOW (ref 3.87–5.11)
RDW: 11.8 % (ref 11.5–15.5)
WBC: 21.1 10*3/uL — ABNORMAL HIGH (ref 4.0–10.5)
nRBC: 0 % (ref 0.0–0.2)

## 2020-01-25 NOTE — TOC Progression Note (Signed)
Transition of Care [facility]) - Progression Note    Patient Details  Name: Sarah [name] MRN: [id number] Date of Birth: [birth year]  [facility] CM/SW Contact  Lennart Pall, LCSW Phone Number: 01/25/2020, 12:58 PM  Clinical Narrative:    Met with pt and daughter this morning and confirming they are in agreement with plan for SNF and prefer [facility] and Rehab if possible.  SNF bed search begun.   Expected Discharge Plan: [place] Barriers to Discharge: Continued Medical Work up  Expected Discharge Plan and Services Expected Discharge Plan: [place] In-house Referral: Clinical Social Work     Living arrangements for the past 2 months: [place]                                       Social Determinants of Health (SDOH) Interventions    Readmission Risk Interventions Readmission Risk Prevention Plan 01/24/2020 12/20/2018  Transportation Screening Complete Complete  PCP or Specialist Appt within 5-7 Days Complete -  Some recent data might be hidden

## 2020-01-25 NOTE — Progress Notes (Signed)
PROGRESS NOTE    Sarah [name]  WUJ:[id number] DOB: [birth year] DOA: 01/21/2020 PCP: Sarah [name], [name], MD   Chief Complain: Right hip pain  Brief Narrative: Patient is a 87 year old female with history of bronchiectasis on chronic steroids, chronic antibiotic therapy, coronary artery disease status , nonhemorrhagic CVA, hypothyroidism, depression, hyperlipidemia who presents to the emergency department with complaints of pain in the right hip after she had unwitnessed fall at independent living facility.  Imaging showed right femoral neck fracture.  Underwent ORIF by orthopedics.  SNF recommended after  PT/OT evaluation.  Patient is medically stable for discharge to skilled nursing facility as soon as bed is available.   Assessment & Plan:   Principal Problem:   Closed displaced fracture of right femoral neck (HCC) Active Problems:   Essential hypertension   Allergic rhinitis   BRONCHIECTASIS   Idiopathic peripheral neuropathy   Depression   Hypothyroidism   Arthritis   Anxiety   Malignant hypertension   TIA (transient ischemic attack)   Chronic diastolic CHF (congestive heart failure) (HCC)   HLD (hyperlipidemia)   HTN (hypertension)   GERD (gastroesophageal reflux disease)   History of CVA (cerebrovascular accident)   CAD (coronary artery disease)   Age-related physical debility   Bilateral hearing loss   Chronic kidney disease, stage 3 unspecified (HCC)   Generalized anxiety disorder   Iron deficiency anemia   Vitamin D deficiency   Displaced right femoral neck fracture: Status post right hip hemiarthroplasty by orthopedics on January 9.  Continue pain management and supportive care.  Started on aspirin twice a day for DVT prophylaxis. PT/OT recommended SNF on  discharge  Acute hypoxic respiratory failure:   Requiring 2 L of oxygen per minute.  Will try continue to wean and stop the oxygen.  She is not on oxygen at home.  Continue incentive spirometry. Follow up CXR done  today showed unchanged chronic increased opacity at the left lung base likely result of atelectasis.  She has a history of bronchiectasis and follows with Sarah [name].  If she requires oxygen on discharge, it is reasonable.  History of bronchiectasis: On chronic steroid and antibiotic therapy.  Follows with pulmonology as an outpatient.  Continue home regimen, chest vest, bronchodilators as needed, Mucinex.  Leukocytosis:Thought to be  secondary to steroid use.  WBC bumped up today.  Follow-up CBC tomorrow.  Hypertension: Currently blood stable.  Continue Coreg, Norvasc  Coronary artery disease: denies any chest pain.  On Plavix, Coreg at home.  History of nonhemorrhagic CVA: Ambulates with the help of walker.  On Plavix at home  Vitamin D deficiency: Continue supplementation  Moderate size hiatal hernia/GERD: Continue PPI twice a day.  Hypothyroidism: On Synthyroid  General anxiety disorder/depression: Continue home medications  Nutrition Problem: Increased nutrient needs Etiology: hip fracture      DVT prophylaxis:Aspirin Code Status: DNR Family Communication: daughter present at bedside Status is: Inpatient  Remains inpatient appropriate because:Unsafe d/c plan   Dispo: The patient is from:Independent living              Anticipated d/c is to: SNF              Anticipated d/c date is: As soon as bed is available              Patient currently is not medically stable to d/c.    Consultants: Ortho  Procedures:As above  Antimicrobials:  Anti-infectives (From admission, onward)   Start     Dose/Rate Route Frequency Ordered  Stop   01/23/20 1800  ceFAZolin (ANCEF) IVPB 2g/100 mL premix        2 g 200 mL/hr over 30 Minutes Intravenous Every 6 hours 01/23/20 1456 01/24/20 0147   01/23/20 1004  ceFAZolin (ANCEF) 2-4 GM/100ML-% IVPB       Note to Pharmacy: Sarah [name]   : cabinet override      01/23/20 1004 01/23/20 1137   01/23/20 1000  ceFAZolin (ANCEF) IVPB 2g/100  mL premix        2 g 200 mL/hr over 30 Minutes Intravenous To [facility] 01/23/20 0927 01/23/20 1146      Subjective:  Patient seen and examined at the bedside this morning.  Hemodynamically stable.  Still requiring 1 to 2 L of oxygen per minute.  Denies any increased cough or shortness of breath.  Pain well controlled on the surgery site  Objective: Vitals:   01/24/20 1941 01/24/20 2230 01/25/20 0633 01/25/20 0741  BP:  117/72 (!) 141/88   Pulse:  68 87   Resp:  17 17   Temp:  (!) 97.5 F (36.4 C) 97.6 F (36.4 C)   TempSrc:  Oral Oral   SpO2: 93% 95% (!) 87% (!) 86%  Weight:      Height:        Intake/Output Summary (Last 24 hours) at 01/25/2020 3536 Last data filed at 01/25/2020 0640 Gross per 24 hour  Intake 120 ml  Output 300 ml  Net -180 ml   Filed Weights   01/21/20 1454  Weight: 54.4 kg    Examination:  General exam: pleasant elderly female HEENT:PERRL,Oral mucosa moist, Ear/Nose normal on gross exam Respiratory system: Bilateral diminished air sounds on the bases Cardiovascular system: S1 & S2 heard, RRR. No JVD, murmurs, rubs, gallops or clicks. Gastrointestinal system: Abdomen is nondistended, soft and nontender. No organomegaly or masses felt. Normal bowel sounds heard. Central nervous system: Alert and oriented. No focal neurological deficits. Extremities: No edema, no clubbing ,no cyanosis, clean surgical wound on the right hip Skin: No rashes, lesions or ulcers,no icterus ,no pallor   Data Reviewed: I have personally reviewed following labs and imaging studies  CBC: Recent Labs  Lab 01/21/20 1338 01/22/20 0423 01/23/20 0314 01/24/20 0337 01/25/20 0340  WBC 15.4* 11.7* 11.7* 13.7* 21.1*  NEUTROABS 14.1*  --  9.5*  --   --   HGB 16.4* 14.3 13.7 12.0 12.4  HCT 52.7* 45.2 43.7 37.9 40.6  MCV 103.1* 103.9* 105.3* 102.2* 108.3*  PLT 277 248 191 174 142*   Basic Metabolic Panel: Recent Labs  Lab 01/21/20 1338 01/22/20 0423  01/23/20 0314 01/24/20 0337  NA 136 138 138 134*  K 4.8 4.4 4.2 4.3  CL 95* 99 100 98  CO2 32 29 30 29   GLUCOSE 153* 91 75 158*  BUN 14 14 13 17   CREATININE 1.03* 0.89 0.81 0.89  CALCIUM 8.7* 8.4* 8.5* 8.1*   GFR: Estimated Creatinine Clearance: 31 mL/min (by C-G formula based on SCr of 0.89 mg/dL). Liver Function Tests: Recent Labs  Lab 01/22/20 0423  ALBUMIN 3.4*   No results for input(s): LIPASE, AMYLASE in the last 168 hours. No results for input(s): AMMONIA in the last 168 hours. Coagulation Profile: Recent Labs  Lab 01/21/20 1338  INR 0.9   Cardiac Enzymes: No results for input(s): CKTOTAL, CKMB, CKMBINDEX, TROPONINI in the last 168 hours. BNP (last 3 results) No results for input(s): PROBNP in the last 8760 hours. HbA1C: No results for input(s): HGBA1C  in the last 72 hours. CBG: No results for input(s): GLUCAP in the last 168 hours. Lipid Profile: No results for input(s): CHOL, HDL, LDLCALC, TRIG, CHOLHDL, LDLDIRECT in the last 72 hours. Thyroid Function Tests: No results for input(s): TSH, T4TOTAL, FREET4, T3FREE, THYROIDAB in the last 72 hours. Anemia Panel: No results for input(s): VITAMINB12, FOLATE, FERRITIN, TIBC, IRON, RETICCTPCT in the last 72 hours. Sepsis Labs: No results for input(s): PROCALCITON, LATICACIDVEN in the last 168 hours.  Recent Results (from the past 240 hour(s))  SARS CORONAVIRUS 2 (TAT 6-24 HRS) Nasopharyngeal Nasopharyngeal Swab     Status: None   Collection Time: 01/21/20  1:38 PM   Specimen: Nasopharyngeal Swab  Result Value Ref Range Status   SARS Coronavirus 2 NEGATIVE NEGATIVE Final    Comment: (NOTE) SARS-CoV-2 target nucleic acids are NOT DETECTED.  The SARS-CoV-2 RNA is generally detectable in upper and lower respiratory specimens during the acute phase of infection. Negative results do not preclude SARS-CoV-2 infection, do not rule out co-infections with other pathogens, and should not be used as the sole basis for  treatment or other patient management decisions. Negative results must be combined with clinical observations, patient history, and epidemiological information. The expected result is Negative.  Fact Sheet for Patients: HairSlick.no  Fact Sheet for Healthcare Providers: quierodirigir.com  This test is not yet approved or cleared by the [place] FDA and  has been authorized for detection and/or diagnosis of SARS-CoV-2 by FDA under an Emergency Use Authorization (EUA). This EUA will remain  in effect (meaning this test can be used) for the duration of the COVID-19 declaration under Se ction 564(b)(1) of the Act, 21 U.S.C. section 360bbb-3(b)(1), unless the authorization is terminated or revoked sooner.  Performed at [facility], [address], [place], [place] [address]   Culture, Urine     Status: Abnormal   Collection Time: 01/21/20  6:34 PM   Specimen: Urine, Catheterized  Result Value Ref Range Status   Specimen Description   Final    URINE, CATHETERIZED Performed at [facility], [address] [place], [place] [address]    Special Requests   Final    NONE Performed at [facility], [address], [place], [place] [address]    Culture (A)  Final    <10,000 COLONIES/mL INSIGNIFICANT GROWTH Performed at [facility], [address], [place], [place] [address]    Report Status 01/23/2020 FINAL  Final  Surgical pcr screen     Status: None   Collection Time: 01/23/20  9:34 AM   Specimen: Nasal Mucosa; Nasal Swab  Result Value Ref Range Status   MRSA, PCR NEGATIVE NEGATIVE Final   Staphylococcus aureus NEGATIVE NEGATIVE Final    Comment: (NOTE) The Xpert SA Assay (FDA approved for NASAL specimens in patients 39 years of age and older), is one component of a comprehensive surveillance program. It is not intended to diagnose infection nor to guide or monitor  treatment. Performed at [facility], [address], [place], [place] [address]          Radiology Studies: Pelvis Portable  Result Date: 01/23/2020 CLINICAL DATA:  Status post RIGHT hip hemiarthroplasty EXAM: PORTABLE PELVIS 1-2 VIEWS COMPARISON:  None. FINDINGS: RIGHT hip arthroplasty hardware appears intact and appropriately positioned. Osseous alignment is anatomic. Expected postsurgical changes of the overlying soft tissues. IMPRESSION: Status post RIGHT hip arthroplasty. No evidence of surgical complicating feature. Electronically Signed   By:  Bary Richard M.D.   On: 01/23/2020 14:45   DG C-Arm 1-60 Min-No Report  Result Date: 01/23/2020 Fluoroscopy was utilized by the requesting physician.  No radiographic interpretation.   DG HIP OPERATIVE UNILAT W OR W/O PELVIS RIGHT  Result Date: 01/23/2020 CLINICAL DATA:  RIGHT hemiarthroplasty EXAM: OPERATIVE RIGHT HIP (WITH PELVIS IF PERFORMED) 2 VIEWS TECHNIQUE: Fluoroscopic spot image(s) were submitted for interpretation post-operatively. COMPARISON:  None. FINDINGS: Fluoroscopic spot images of the RIGHT hip show RIGHT hip hemiarthroplasty hardware. Hardware appears intact and appropriately positioned. Fluoroscopy provided for 4 seconds. IMPRESSION: Intraoperative images demonstrating RIGHT hip hemiarthroplasty hardware. No evidence of surgical complicating feature. Electronically Signed   By: Bary Richard M.D.   On: 01/23/2020 13:18        Scheduled Meds: . acetaminophen  500 mg Oral TID  . albuterol  2.5 mg Nebulization BID  . ALPRAZolam  0.5 mg Oral Q12H  . amLODipine  2.5 mg Oral QPM  . aspirin  81 mg Oral BID WC  . Brinzolamide-Brimonidine  1 drop Left Eye TID  . carvedilol  6.25 mg Oral BID WC  . clopidogrel  75 mg Oral Daily  . docusate sodium  100 mg Oral BID  . DULoxetine  20 mg Oral TID  . escitalopram  5 mg Oral Daily  . feeding supplement  237 mL Oral BID BM  . guaiFENesin  1,200 mg Oral BID  .  latanoprost  1 drop Both Eyes QHS  . levothyroxine  50 mcg Oral Daily  . multivitamin with minerals  1 tablet Oral Daily  . pantoprazole  40 mg Oral BID  . predniSONE  10 mg Oral Q breakfast   Continuous Infusions:   LOS: 4 days    Time spent: 25 mins.More than 50% of that time was spent in counseling and/or coordination of care.      Burnadette Pop, MD [facility] P1/11/2020, 8:07 AM

## 2020-01-25 NOTE — Progress Notes (Signed)
Transition of Care (TOC) -30 day Note       Patient Details  Name:  Morayo Leven MRN:  [id number] Date of Birth:  12-16-1921   Transition of Care [facility]) CM/SW Contact  Name:  Amada Jupiter Phone Number:  [phone] Date:  01/25/2020 Time:  11:52am   MUST ID: [id number]   To Whom it May Concern:   Please be advised that the above patient will require a short-term nursing home stay, anticipated 30 days or less rehabilitation and strengthening. The plan is for return home.

## 2020-01-25 NOTE — NC FL2 (Addendum)
[place] MEDICAID FL2 LEVEL OF CARE SCREENING TOOL     IDENTIFICATION  Patient Name: Sarah [name] Birthdate: 03/04/1921 Sex: female Admission Date (Current Location): 01/21/2020  [facility] and [place] Number:  Producer, television/film/video and Address:  [facility],  [address] [place] [address]      Provider Number: [id number]  Attending Physician Name and Address:  Burnadette Pop, MD  Relative Name and Phone Number:  daughter, Kendall Flack @ [phone]    Current Level of Care: Hospital Recommended Level of Care: Skilled Nursing Facility Prior Approval Number:    Date Approved/Denied:   PASRR Number: [id number]  Discharge Plan: SNF    Current Diagnoses: Patient Active Problem List   Diagnosis Date Noted  . Rib pain   . Closed displaced fracture of right femoral neck (HCC) 01/21/2020  . Acute non-ST segment elevation myocardial infarction (HCC) 01/05/2020  . Debility 01/05/2020  . Age-related physical debility 01/05/2020  . Aneurysm of carotid artery (HCC) 01/05/2020  . Bilateral hearing loss 01/05/2020  . Bronchiolectasis (HCC) 01/05/2020  . Chronic kidney disease, stage 3 unspecified (HCC) 01/05/2020  . Deficiency of macronutrients 01/05/2020  . Protein calorie malnutrition (HCC) 01/05/2020  . Diaphragmatic hernia 01/05/2020  . Dysphagia 01/05/2020  . Edema of lower extremity 01/05/2020  . Generalized anxiety disorder 01/05/2020  . Gingivostomatitis 01/05/2020  . Hyperglycemia 01/05/2020  . Hypertensive retinopathy 01/05/2020  . Impulse control disorder 01/05/2020  . Trichotillomania 01/05/2020  . Iron deficiency anemia 01/05/2020  . Lung mass 01/05/2020  . Lymphedema 01/05/2020  . Macular degeneration 01/05/2020  . Major depression single episode, in partial remission (HCC) 01/05/2020  . Migraine 01/05/2020  . Mycobacteriosis 01/05/2020  . Other specified disorders of bone density and structure, other site 01/05/2020  . Prediabetes  01/05/2020  . Recurrent major depression (HCC) 01/05/2020  . Retinal drusen 01/05/2020  . Rosacea 01/05/2020  . Sciatica 01/05/2020  . Senile purpura (HCC) 01/05/2020  . Tear film insufficiency 01/05/2020  . Vitamin D deficiency 01/05/2020  . Pseudomonas aeruginosa colonization 07/23/2019  . Aspiration pneumonia of both lungs (HCC) 07/23/2019  . Acute respiratory failure with hypoxia (HCC) 07/02/2019  . History of CVA (cerebrovascular accident) 07/02/2019  . CAD (coronary artery disease) 07/02/2019  . Glaucoma 07/02/2019  . S/P thoracentesis   . Pleural effusion   . Pneumonia of left lung due to infectious organism 12/16/2018  . Community acquired pneumonia 12/16/2018  . HCAP (healthcare-associated pneumonia) 12/16/2018  . Leukocytosis   . Hemoptysis 03/16/2018  . GERD (gastroesophageal reflux disease) 03/19/2017  . Nausea vomiting and diarrhea   . Gastroenteritis due to norovirus 01/25/2017  . Adrenal insufficiency (HCC) 03/21/2015  . HTN (hypertension) 02/14/2015  . Fecal impaction (HCC) 10/07/2014  . Colitis 10/06/2014  . 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 Aneurysm, cerebral, nonruptured 09/09/2014  . HLD (hyperlipidemia) 09/09/2014  . Acute ischemic stroke (HCC) 07/09/2014  . Chronic diastolic CHF (congestive heart failure) (HCC) 07/09/2014  . Stroke (HCC) 07/08/2014  . TIA (transient ischemic attack) 07/08/2014  . Chest pain 04/15/2012  . Shortness of breath dyspnea 11/23/2011  . Anxiety 11/23/2011  . Malignant hypertension 11/23/2011  . Elevated troponin 06/20/2011  . Depression   . Hypothyroidism   . Arthritis   . Racing heart beat 02/11/2011  . NSTEMI (non-ST elevated myocardial infarction) (HCC) 09/24/2010  . Idiopathic peripheral neuropathy   . Pollen allergies   . BRONCHIECTASIS 07/29/2008  . Essential hypertension 06/15/2008  . Allergic rhinitis  06/15/2008  . Asthma 06/15/2008  . Cough 06/15/2008    Orientation  RESPIRATION BLADDER Height & Weight     Self,Time,Situation,Place  O2 (I L but weaning off) Continent Weight: 120 lb (54.4 kg) Height:  5\' 4"  (162.6 cm)  BEHAVIORAL SYMPTOMS/MOOD NEUROLOGICAL BOWEL NUTRITION STATUS      Continent    AMBULATORY STATUS COMMUNICATION OF NEEDS Skin   Limited Assist Verbally Surgical wounds                       Personal Care Assistance Level of Assistance  Bathing,Dressing Bathing Assistance: Limited assistance   Dressing Assistance: Limited assistance     Functional Limitations Info             SPECIAL CARE FACTORS FREQUENCY  PT (By licensed PT),OT (By licensed OT)     PT Frequency: 5x/wk OT Frequency: 5x/wk            Contractures Contractures Info: Not present    Additional Factors Info  Code Status,Allergies,Psychotropic Code Status Info: DNR Allergies Info: see MAR Psychotropic Info: see MAR         Current Medications (01/25/2020):  This is the current hospital active medication list Current Facility-Administered Medications  Medication Dose Route Frequency Provider Last Rate Last Admin  . acetaminophen (TYLENOL) tablet 500 mg  500 mg Oral TID 03/24/2020, MD   500 mg at 01/25/20 1050  . albuterol (PROVENTIL) (2.5 MG/3ML) 0.083% nebulizer solution 2.5 mg  2.5 mg Nebulization BID 03/24/20, MD   2.5 mg at 01/25/20 0741  . ALPRAZolam 03/24/20) tablet 0.5 mg  0.5 mg Oral Q12H Prudy Feeler, MD   0.5 mg at 01/25/20 0758  . ALPRAZolam 03/24/20) tablet 0.5 mg  0.5 mg Oral Daily PRN Prudy Feeler, MD   0.5 mg at 01/22/20 0512  . amLODipine (NORVASC) tablet 2.5 mg  2.5 mg Oral QPM 03/21/20, MD   2.5 mg at 01/21/20 1944  . aspirin chewable tablet 81 mg  81 mg Oral BID WC 03/20/20, MD   81 mg at 01/25/20 0758  . Brinzolamide-Brimonidine 1-0.2 % SUSP 1 drop  1 drop Left Eye TID 03/24/20, MD   1 drop at 01/25/20 1220  . calcium carbonate (TUMS - dosed in mg elemental calcium) chewable tablet 200 mg of  elemental calcium  1 tablet Oral Q8H PRN Swinteck, 03/24/20, MD      . carvedilol (COREG) tablet 6.25 mg  6.25 mg Oral BID WC Arlys John, MD   6.25 mg at 01/25/20 0758  . clopidogrel (PLAVIX) tablet 75 mg  75 mg Oral Daily 03/24/20, RPH   75 mg at 01/25/20 1051  . docusate sodium (COLACE) capsule 100 mg  100 mg Oral BID 03/24/20, MD   100 mg at 01/25/20 1050  . DULoxetine (CYMBALTA) DR capsule 20 mg  20 mg Oral TID 03/24/20, MD   20 mg at 01/25/20 1050  . escitalopram (LEXAPRO) tablet 5 mg  5 mg Oral Daily Swinteck, 03/24/20, MD   5 mg at 01/25/20 1051  . feeding supplement (ENSURE SURGERY) liquid 237 mL  237 mL Oral BID BM 03/24/20, MD   237 mL at 01/25/20 1054  . fentaNYL (SUBLIMAZE) injection 50 mcg  50 mcg Intravenous Q2H PRN Swinteck, 03/24/20, MD      . guaiFENesin (MUCINEX) 12 hr tablet 1,200 mg  1,200 mg Oral BID Arlys John, MD   1,200 mg at 01/25/20 1057  .  latanoprost (XALATAN) 0.005 % ophthalmic solution 1 drop  1 drop Both Eyes QHS Samson Frederic, MD   1 drop at 01/24/20 2202  . levothyroxine (SYNTHROID) tablet 50 mcg  50 mcg Oral Daily Samson Frederic, MD   50 mcg at 01/25/20 0617  . menthol-cetylpyridinium (CEPACOL) lozenge 3 mg  1 lozenge Oral PRN Swinteck, Arlys John, MD       Or  . phenol (CHLORASEPTIC) mouth spray 1 spray  1 spray Mouth/Throat PRN Swinteck, Arlys John, MD      . metoCLOPramide (REGLAN) tablet 5-10 mg  5-10 mg Oral Q8H PRN Swinteck, Arlys John, MD       Or  . metoCLOPramide (REGLAN) injection 5-10 mg  5-10 mg Intravenous Q8H PRN Swinteck, Arlys John, MD      . multivitamin with minerals tablet 1 tablet  1 tablet Oral Daily Samson Frederic, MD   1 tablet at 01/25/20 1051  . ondansetron (ZOFRAN) tablet 4 mg  4 mg Oral Q6H PRN Swinteck, Arlys John, MD       Or  . ondansetron (ZOFRAN) injection 4 mg  4 mg Intravenous Q6H PRN Swinteck, Arlys John, MD      . pantoprazole (PROTONIX) EC tablet 40 mg  40 mg Oral BID Samson Frederic, MD   40 mg at 01/25/20 1051  .  polyethylene glycol (MIRALAX / GLYCOLAX) packet 17 g  17 g Oral Daily PRN Swinteck, Arlys John, MD      . predniSONE (DELTASONE) tablet 10 mg  10 mg Oral Q breakfast Samson Frederic, MD   10 mg at 01/25/20 0758  . senna (SENOKOT) tablet 8.6 mg  1 tablet Oral Daily PRN Swinteck, Arlys John, MD      . sodium phosphate (FLEET) 7-19 GM/118ML enema 1 enema  1 enema Rectal Once PRN Swinteck, Arlys John, MD      . sorbitol 70 % solution 30 mL  30 mL Oral Daily PRN Swinteck, Arlys John, MD      . traMADol Janean Sark) tablet 100 mg  100 mg Oral Q6H PRN Samson Frederic, MD   100 mg at 01/24/20 0545     Discharge Medications: Please see discharge summary for a list of discharge medications.  Relevant Imaging Results:  Relevant Lab Results:   Additional Information ssn:[id number]  Manroop Jakubowicz, LCSW

## 2020-01-26 ENCOUNTER — Encounter (HOSPITAL_COMMUNITY): Payer: Self-pay | Admitting: Orthopedic Surgery

## 2020-01-26 DIAGNOSIS — S72001A Fracture of unspecified part of neck of right femur, initial encounter for closed fracture: Secondary | ICD-10-CM | POA: Diagnosis not present

## 2020-01-26 LAB — CBC
HCT: 37.4 % (ref 36.0–46.0)
Hemoglobin: 11.8 g/dL — ABNORMAL LOW (ref 12.0–15.0)
MCH: 32.2 pg (ref 26.0–34.0)
MCHC: 31.6 g/dL (ref 30.0–36.0)
MCV: 101.9 fL — ABNORMAL HIGH (ref 80.0–100.0)
Platelets: 219 10*3/uL (ref 150–400)
RBC: 3.67 MIL/uL — ABNORMAL LOW (ref 3.87–5.11)
RDW: 11.9 % (ref 11.5–15.5)
WBC: 16.4 10*3/uL — ABNORMAL HIGH (ref 4.0–10.5)
nRBC: 0 % (ref 0.0–0.2)

## 2020-01-26 LAB — BASIC METABOLIC PANEL
Anion gap: 9 (ref 5–15)
BUN: 19 mg/dL (ref 8–23)
CO2: 29 mmol/L (ref 22–32)
Calcium: 8.2 mg/dL — ABNORMAL LOW (ref 8.9–10.3)
Chloride: 97 mmol/L — ABNORMAL LOW (ref 98–111)
Creatinine, Ser: 0.69 mg/dL (ref 0.44–1.00)
GFR, Estimated: >60 mL/min (ref >60)
Glucose, Bld: 108 mg/dL — ABNORMAL HIGH (ref 70–99)
Potassium: 4.4 mmol/L (ref 3.5–5.1)
Sodium: 135 mmol/L (ref 135–145)

## 2020-01-26 MED ORDER — TRAMADOL HCL 50 MG PO TABS: 50.0000 mg | ORAL_TABLET | Freq: Four times a day (QID) | ORAL | 0 refills | Status: DC | PRN

## 2020-01-26 MED ORDER — ASPIRIN 81 MG PO CHEW: 81.0000 mg | CHEWABLE_TABLET | Freq: Two times a day (BID) | ORAL | 0 refills | Status: AC

## 2020-01-26 NOTE — Progress Notes (Signed)
PROGRESS NOTE    Sarah [name]  [id number]:[id number] DOB: Sep 06, 1921 DOA: 01/21/2020 PCP: [name], [name], MD   Chief Complain: Right hip pain  Brief Narrative: Patient is a 87 year old female with history of bronchiectasis on chronic steroids, chronic antibiotic therapy, coronary artery disease status , nonhemorrhagic CVA, hypothyroidism, depression, hyperlipidemia who presents to the emergency department with complaints of pain in the right hip after she had unwitnessed fall at independent living facility.  Imaging showed right femoral neck fracture.  Underwent ORIF by orthopedics.  SNF recommended after  PT/OT evaluation.  Patient is medically stable for discharge to skilled nursing facility as soon as bed is available.   Assessment & Plan:   Principal Problem:   Closed displaced fracture of right femoral neck (HCC) Active Problems:   Essential hypertension   Allergic rhinitis   BRONCHIECTASIS   Idiopathic peripheral neuropathy   Depression   Hypothyroidism   Arthritis   Anxiety   Malignant hypertension   TIA (transient ischemic attack)   Chronic diastolic CHF (congestive heart failure) (HCC)   HLD (hyperlipidemia)   HTN (hypertension)   GERD (gastroesophageal reflux disease)   History of CVA (cerebrovascular accident)   CAD (coronary artery disease)   Age-related physical debility   Bilateral hearing loss   Chronic kidney disease, stage 3 unspecified (HCC)   Generalized anxiety disorder   Iron deficiency anemia   Vitamin D deficiency   Displaced right femoral neck fracture: Status post right hip hemiarthroplasty by orthopedics on January 9.  Continue pain management and supportive care.  Started on aspirin twice a day for DVT prophylaxis. PT/OT recommended SNF on  discharge  Acute hypoxic respiratory failure:   Requiring 1 L of oxygen per minute.  Will try continue to wean and stop the oxygen.  She is not on oxygen at home.  Continue incentive spirometry. Follow up CXR done  on 01/25/20 showed unchanged chronic increased opacity at the left lung base likely result of atelectasis.  She has a history of bronchiectasis and follows with Dr. Delton CoombesByrum.  If she requires oxygen on discharge, it is reasonable.  History of bronchiectasis: On chronic steroid and antibiotic therapy.  Follows with pulmonology as an outpatient.  Continue home regimen, chest vest, bronchodilators as needed, Mucinex.  Leukocytosis:Thought to be  secondary to steroid use. Continue intermittent monitoring  Hypertension: Currently blood stable.  Continue Coreg, Norvasc  Coronary artery disease: denies any chest pain.  On Plavix, Coreg at home.  History of nonhemorrhagic CVA: Ambulates with the help of walker.  On Plavix at home  Vitamin D deficiency: Continue supplementation  Moderate size hiatal hernia/GERD: Continue PPI twice a day.  Hypothyroidism: On Synthyroid  General anxiety disorder/depression: Continue home medications  Nutrition Problem: Increased nutrient needs Etiology: hip fracture      DVT prophylaxis:Aspirin Code Status: DNR Family Communication: None at bedside Status is: Inpatient  Remains inpatient appropriate because:Unsafe d/c plan   Dispo: The patient is from:Independent living              Anticipated d/c is to: SNF              Anticipated d/c date is: As soon as bed is available              Patient currently is stable for dc    Consultants: Ortho  Procedures:As above  Antimicrobials:  Anti-infectives (From admission, onward)   Start     Dose/Rate Route Frequency Ordered Stop   01/23/20 1800  ceFAZolin (ANCEF)  IVPB 2g/100 mL premix        2 g 200 mL/hr over 30 Minutes Intravenous Every 6 hours 01/23/20 1456 01/24/20 0147   01/23/20 1004  ceFAZolin (ANCEF) 2-4 GM/100ML-% IVPB       Note to Pharmacy: [name]   : cabinet override      01/23/20 1004 01/23/20 1137   01/23/20 1000  ceFAZolin (ANCEF) IVPB 2g/100 mL premix        2 g 200 mL/hr over  30 Minutes Intravenous To [facility] 01/23/20 0927 01/23/20 1146      Subjective:  Patient seen and examined at bedside this morning.  Hemodynamically stable.  On 1 L of oxygen per minute.  Denies any worsening shortness of breath or cough.  Pain well controlled  Objective: Vitals:   01/25/20 1441 01/25/20 1949 01/25/20 2055 01/26/20 0530  BP: 134/77  (!) 142/76 137/76  Pulse: 91  82 77  Resp: 18  15   Temp: 97.9 F (36.6 C)   98.2 F (36.8 C)  TempSrc: Oral   Oral  SpO2: 91% 93% 97% 97%  Weight:      Height:        Intake/Output Summary (Last 24 hours) at 01/26/2020 0739 Last data filed at 01/25/2020 2200 Gross per 24 hour  Intake 120 ml  Output 500 ml  Net -380 ml   Filed Weights   01/21/20 1454  Weight: 54.4 kg    Examination:  General exam: Pleasant elderly female, very deconditioned, debilitated Respiratory system: Bilateral diminished air sounds cardiovascular system: S1 & S2 heard, RRR. No JVD, murmurs, rubs, gallops or clicks. Gastrointestinal system: Abdomen is nondistended, soft and nontender. No organomegaly or masses felt. Normal bowel sounds heard. Central nervous system: Alert and oriented. No focal neurological deficits. Extremities: No edema, no clubbing ,no cyanosis, clean surgical wound on the right hip  skin: No rashes, lesions or ulcers,no icterus ,no pallor   Data Reviewed: I have personally reviewed following labs and imaging studies  CBC: Recent Labs  Lab 01/21/20 1338 01/22/20 0423 01/23/20 0314 01/24/20 0337 01/25/20 0340 01/26/20 0332  WBC 15.4* 11.7* 11.7* 13.7* 21.1* 16.4*  NEUTROABS 14.1*  --  9.5*  --   --   --   HGB 16.4* 14.3 13.7 12.0 12.4 11.8*  HCT 52.7* 45.2 43.7 37.9 40.6 37.4  MCV 103.1* 103.9* 105.3* 102.2* 108.3* 101.9*  PLT 277 248 191 174 142* 219   Basic Metabolic Panel: Recent Labs  Lab 01/21/20 1338 01/22/20 0423 01/23/20 0314 01/24/20 0337 01/26/20 0332  NA 136 138 138 134* 135  K 4.8 4.4 4.2  4.3 4.4  CL 95* 99 100 98 97*  CO2 32 29 30 29 29   GLUCOSE 153* 91 75 158* 108*  BUN 14 14 13 17 19   CREATININE 1.03* 0.89 0.81 0.89 0.69  CALCIUM 8.7* 8.4* 8.5* 8.1* 8.2*   GFR: Estimated Creatinine Clearance: 34.5 mL/min (by C-G formula based on SCr of 0.69 mg/dL). Liver Function Tests: Recent Labs  Lab 01/22/20 0423  ALBUMIN 3.4*   No results for input(s): LIPASE, AMYLASE in the last 168 hours. No results for input(s): AMMONIA in the last 168 hours. Coagulation Profile: Recent Labs  Lab 01/21/20 1338  INR 0.9   Cardiac Enzymes: No results for input(s): CKTOTAL, CKMB, CKMBINDEX, TROPONINI in the last 168 hours. BNP (last 3 results) No results for input(s): PROBNP in the last 8760 hours. HbA1C: No results for input(s): HGBA1C in the last 72 hours. CBG:  No results for input(s): GLUCAP in the last 168 hours. Lipid Profile: No results for input(s): CHOL, HDL, LDLCALC, TRIG, CHOLHDL, LDLDIRECT in the last 72 hours. Thyroid Function Tests: No results for input(s): TSH, T4TOTAL, FREET4, T3FREE, THYROIDAB in the last 72 hours. Anemia Panel: No results for input(s): VITAMINB12, FOLATE, FERRITIN, TIBC, IRON, RETICCTPCT in the last 72 hours. Sepsis Labs: No results for input(s): PROCALCITON, LATICACIDVEN in the last 168 hours.  Recent Results (from the past 240 hour(s))  SARS CORONAVIRUS 2 (TAT 6-24 HRS) Nasopharyngeal Nasopharyngeal Swab     Status: None   Collection Time: 01/21/20  1:38 PM   Specimen: Nasopharyngeal Swab  Result Value Ref Range Status   SARS Coronavirus 2 NEGATIVE NEGATIVE Final    Comment: (NOTE) SARS-CoV-2 target nucleic acids are NOT DETECTED.  The SARS-CoV-2 RNA is generally detectable in upper and lower respiratory specimens during the acute phase of infection. Negative results do not preclude SARS-CoV-2 infection, do not rule out co-infections with other pathogens, and should not be used as the sole basis for treatment or other patient management  decisions. Negative results must be combined with clinical observations, patient history, and epidemiological information. The expected result is Negative.  Fact Sheet for Patients: HairSlick.no  Fact Sheet for Healthcare Providers: quierodirigir.com  This test is not yet approved or cleared by the [place] FDA and  has been authorized for detection and/or diagnosis of SARS-CoV-2 by FDA under an Emergency Use Authorization (EUA). This EUA will remain  in effect (meaning this test can be used) for the duration of the COVID-19 declaration under Se ction 564(b)(1) of the Act, 21 U.S.C. section 360bbb-3(b)(1), unless the authorization is terminated or revoked sooner.  Performed at [facility], [address] [place], [place] [address]   Culture, Urine     Status: Abnormal   Collection Time: 01/21/20  6:34 PM   Specimen: Urine, Catheterized  Result Value Ref Range Status   Specimen Description   Final    URINE, CATHETERIZED Performed at [facility], [address] [place], [place] [address]    Special Requests   Final    NONE Performed at [facility], [address], [place], [place] [address]    Culture (A)  Final    <10,000 COLONIES/mL INSIGNIFICANT GROWTH Performed at [facility], [address] [place], [place] [address]    Report Status 01/23/2020 FINAL  Final  Surgical pcr screen     Status: None   Collection Time: 01/23/20  9:34 AM   Specimen: Nasal Mucosa; Nasal Swab  Result Value Ref Range Status   MRSA, PCR NEGATIVE NEGATIVE Final   Staphylococcus aureus NEGATIVE NEGATIVE Final    Comment: (NOTE) The Xpert SA Assay (FDA approved for NASAL specimens in patients 50 years of age and older), is one component of a comprehensive surveillance program. It is not intended to diagnose infection nor to guide or monitor treatment. Performed at [facility], [address], [place], [place] [address]          Radiology Studies: DG CHEST PORT 1 VIEW  Result Date: 01/25/2020 CLINICAL DATA:  Dyspnea EXAM: PORTABLE CHEST 1 VIEW COMPARISON:  Chest radiograph 01/22/2020 FINDINGS: Heart size is within normal limits. No significant from wrist to congestion. Chronic increased left basilar opacity is unchanged. Nodular opacity in the right mid lung unchanged from prior exam. IMPRESSION: Unchanged chronic increased opacity at the left lung base likely result of  atelectasis. No new acute abnormality identified. Soft Electronically Signed   By: Acquanetta Belling M.D.   On: 01/25/2020 08:34        Scheduled Meds: . acetaminophen  500 mg Oral TID  . albuterol  2.5 mg Nebulization BID  . ALPRAZolam  0.5 mg Oral Q12H  . amLODipine  2.5 mg Oral QPM  . aspirin  81 mg Oral BID WC  . Brinzolamide-Brimonidine  1 drop Left Eye TID  . carvedilol  6.25 mg Oral BID WC  . clopidogrel  75 mg Oral Daily  . docusate sodium  100 mg Oral BID  . DULoxetine  20 mg Oral TID  . escitalopram  5 mg Oral Daily  . feeding supplement  237 mL Oral BID BM  . guaiFENesin  1,200 mg Oral BID  . latanoprost  1 drop Both Eyes QHS  . levothyroxine  50 mcg Oral Daily  . multivitamin with minerals  1 tablet Oral Daily  . pantoprazole  40 mg Oral BID  . predniSONE  10 mg Oral Q breakfast   Continuous Infusions:   LOS: 5 days    Time spent: 25 mins.More than 50% of that time was spent in counseling and/or coordination of care.      Burnadette Pop, MD [facility] P1/12/2020, 7:39 AM

## 2020-01-26 NOTE — Plan of Care (Signed)
  Problem: Nutrition: Goal: Adequate nutrition will be maintained Outcome: Progressing   Problem: Elimination: Goal: Will not experience complications related to urinary retention Outcome: Progressing   

## 2020-01-26 NOTE — Plan of Care (Signed)
°  Problem: Education: °Goal: Knowledge of General Education information will improve °Description: Including pain rating scale, medication(s)/side effects and non-pharmacologic comfort measures °Outcome: Progressing °  °Problem: Health Behavior/Discharge Planning: °Goal: Ability to manage health-related needs will improve °Outcome: Progressing °  °Problem: Clinical Measurements: °Goal: Ability to maintain clinical measurements within normal limits will improve °Outcome: Progressing °Goal: Will remain free from infection °Outcome: Progressing °Goal: Diagnostic test results will improve °Outcome: Progressing °Goal: Respiratory complications will improve °Outcome: Progressing °Goal: Cardiovascular complication will be avoided °Outcome: Progressing °  °Problem: Activity: °Goal: Risk for activity intolerance will decrease °Outcome: Progressing °  °Problem: Nutrition: °Goal: Adequate nutrition will be maintained °Outcome: Progressing °  °Problem: Coping: °Goal: Level of anxiety will decrease °Outcome: Progressing °  °Problem: Elimination: °Goal: Will not experience complications related to bowel motility °Outcome: Progressing °Goal: Will not experience complications related to urinary retention °Outcome: Progressing °  °Problem: Pain Managment: °Goal: General experience of comfort will improve °Outcome: Progressing °  °Problem: Safety: °Goal: Ability to remain free from injury will improve °Outcome: Progressing °  °Problem: Skin Integrity: °Goal: Risk for impaired skin integrity will decrease °Outcome: Progressing °  °Problem: Education: °Goal: Verbalization of understanding the information provided (i.e., activity precautions, restrictions, etc) will improve °Outcome: Progressing °Goal: Individualized Educational Video(s) °Outcome: Progressing °  °Problem: Activity: °Goal: Ability to ambulate and perform ADLs will improve °Outcome: Progressing °  °Problem: Self-Concept: °Goal: Ability to maintain and perform role  responsibilities to the fullest extent possible will improve °Outcome: Progressing °  °Problem: Clinical Measurements: °Goal: Postoperative complications will be avoided or minimized °Outcome: Progressing °  °

## 2020-01-26 NOTE — Progress Notes (Signed)
    Subjective:  Patient reports pain as mild to moderate. She is working with OT during my examination. Denies N/V/CP/SOB.   Objective:   VITALS:   Vitals:   01/25/20 1441 01/25/20 1949 01/25/20 2055 01/26/20 0530  BP: 134/77  (!) 142/76 137/76  Pulse: 91  82 77  Resp: 18  15   Temp: 97.9 F (36.6 C)   98.2 F (36.8 C)  TempSrc: Oral   Oral  SpO2: 91% 93% 97% 97%  Weight:      Height:        NAD ABD soft Neurovascular intact Sensation intact distally Intact pulses distally Dorsiflexion/Plantar flexion intact Incision: dressing C/D/I   Lab Results  Component Value Date   WBC 16.4 (H) 01/26/2020   HGB 11.8 (L) 01/26/2020   HCT 37.4 01/26/2020   MCV 101.9 (H) 01/26/2020   PLT 219 01/26/2020   BMET    Component Value Date/Time   NA 135 01/26/2020 0332   NA 139 03/25/2017 1216   K 4.4 01/26/2020 0332   CL 97 (L) 01/26/2020 0332   CO2 29 01/26/2020 0332   GLUCOSE 108 (H) 01/26/2020 0332   BUN 19 01/26/2020 0332   BUN 14 03/25/2017 1216   CREATININE 0.69 01/26/2020 0332   CALCIUM 8.2 (L) 01/26/2020 0332   GFRNONAA >60 01/26/2020 0332   GFRAA >60 07/09/2019 0559     Assessment/Plan: 3 Days Post-Op   Principal Problem:   Closed displaced fracture of right femoral neck (HCC) Active Problems:   Essential hypertension   Allergic rhinitis   BRONCHIECTASIS   Idiopathic peripheral neuropathy   Depression   Hypothyroidism   Arthritis   Anxiety   Malignant hypertension   TIA (transient ischemic attack)   Chronic diastolic CHF (congestive heart failure) (HCC)   HLD (hyperlipidemia)   HTN (hypertension)   GERD (gastroesophageal reflux disease)   History of CVA (cerebrovascular accident)   CAD (coronary artery disease)   Age-related physical debility   Bilateral hearing loss   Chronic kidney disease, stage 3 unspecified (HCC)   Generalized anxiety disorder   Iron deficiency anemia   Vitamin D deficiency   WBAT with walker DVT ppx: Aspirin and  Plavix, SCDs, TEDS PO pain control PT/OT Dispo: D/C pending to SNF once cleared by PT/OT     Darrick Grinder 01/26/2020, 8:40 AM [facility] is now [employer] [address], [place], [place] [address] Phone: [phone] [email or web address] Facebook  [employer]

## 2020-01-26 NOTE — Progress Notes (Signed)
Occupational Therapy Treatment Patient Details Name: Sarah [name] MRN: [id number] DOB: April 19, 1921 Today's Date: 01/26/2020    History of present illness 87 year old female who fell in her Independent Living facility and sustained a RT hip fracture. Pt s/p right hip hemiarthroplasty, anterior approach on 01/23/2020.   OT comments  Treatment focused on functional mobility in order to prepare for higher level ADLs. Patient supervision to transfer to side of bed. Mod assist to stand and min assist to take steps to recliner with Rw. Patient needed verbal cues for technique. Patient performed grooming task seated in chair. Patient reports improved pain/ no pain today. Cont POC to progress towards goals.  Follow Up Recommendations  SNF    Equipment Recommendations       Recommendations for Other Services      Precautions / Restrictions Precautions Precautions: Fall Precaution Comments: no precautions - direct anterior approach Restrictions Weight Bearing Restrictions: No RLE Weight Bearing: Weight bearing as tolerated       Mobility Bed Mobility Overal bed mobility: Needs Assistance Bed Mobility: Supine to Sit     Supine to sit: HOB elevated;Supervision     General bed mobility comments: supervision to transfer to side of bed. Increased time needed.  Transfers Overall transfer level: Needs assistance Equipment used: Rolling walker (2 wheeled) Transfers: Sit to/from Stand Sit to Stand: Mod assist;From elevated surface Stand pivot transfers: Min assist       General transfer comment: Mod assist to stand from elevated bed height with use of RW. Once stablizied patient min assist to take steps to recliner with verbal cues on technique and hand placement on RW.    Balance Overall balance assessment: Needs assistance Sitting-balance support: No upper extremity supported;Feet supported Sitting balance-Leahy Scale: Fair     Standing balance support: Bilateral upper extremity  supported;During functional activity Standing balance-Leahy Scale: Poor                             ADL either performed or assessed with clinical judgement   ADL Overall ADL's : Needs assistance/impaired     Grooming: Set up;Wash/dry face;Wash/dry hands;Sitting Grooming Details (indicate cue type and reason): performed grooming in chair                                     Vision Baseline Vision/History: Wears glasses Wears Glasses: At all times Vision Assessment?: No apparent visual deficits   Perception     Praxis      Cognition Arousal/Alertness: Awake/alert Behavior During Therapy: WFL for tasks assessed/performed Overall Cognitive Status: Within Functional Limits for tasks assessed                                          Exercises     Shoulder Instructions       General Comments      Pertinent Vitals/ Pain       Pain Assessment: Faces Faces Pain Scale: No hurt Pain Location: reporting no pain today Pain Intervention(s): Monitored during session  Home Living  Prior Functioning/Environment              Frequency  Min 2X/week        Progress Toward Goals  OT Goals(current goals can now be found in the care plan section)  Progress towards OT goals: Progressing toward goals  Acute Rehab OT Goals Patient Stated Goal: To "get back to it." OT Goal Formulation: With patient Time For Goal Achievement: 02/07/20 Potential to Achieve Goals: Good  Plan Discharge plan remains appropriate    Co-evaluation                 AM-PAC OT "6 Clicks" Daily Activity     Outcome Measure   Help from another person eating meals?: None Help from another person taking care of personal grooming?: A Little Help from another person toileting, which includes using toliet, bedpan, or urinal?: A Lot Help from another person bathing (including washing, rinsing,  drying)?: A Lot Help from another person to put on and taking off regular upper body clothing?: A Little Help from another person to put on and taking off regular lower body clothing?: A Lot 6 Click Score: 16    End of Session Equipment Utilized During Treatment: Gait belt;Rolling walker  OT Visit Diagnosis: Unsteadiness on feet (R26.81);History of falling (Z91.81)   Activity Tolerance Patient tolerated treatment well   Patient Left in chair;with call bell/phone within reach;with chair alarm set;with family/visitor present   Nurse Communication Mobility status        Time: 6948-5462 OT Time Calculation (min): 21 min  Charges: OT General Charges $OT Visit: 1 Visit OT Treatments $Therapeutic Activity: 8-22 mins  Waldron Session, OTR/L Acute Care Rehab Services  Office [phone] Pager: [phone]    Kelli Churn 01/26/2020, 11:43 AM

## 2020-01-26 NOTE — Progress Notes (Signed)
Physical Therapy Treatment Patient Details Name: Sarah [name] MRN: [id number] DOB: 1921/04/29 Today's Date: 01/26/2020    History of Present Illness 87 year old female who fell in her Independent Living facility and sustained a RT hip fracture. Pt s/p right hip hemiarthroplasty, anterior approach on 01/23/2020.    PT Comments    POD # 3 Pt was seen by OT this morning.  PT pm session pt in bed on 1 lt sats 92%. Assisted OOB to amb.  General transfer comment: assisted from elevated bed with one VC on proper hand placement.  Present with severe Kyphotic posture with full floor view.  Also assisted with a toilet transfer with 25% VC's on safety with turns and turn completion with hand placement to control desend.  Required assist to don/doff underpants and stabelize for balance. General Gait Details: assisted with amb to and from bathroom + 2 assist for safety present with severe Kyphotic posture with full floor view.  Slow shuffled steps.  25% VC's on proper sequencing.  Trial RA sats decreased to 87% with no c/o dyspnea just fatigue.  Required increased time to complete distance.  Unsteady with turns and backward gait.  Assisted and positioned in recliner with multiple pillows. HIGH FALL RISK. Pt will need ST Rehab at SNF prior to returning to [facility].     Follow Up Recommendations  SNF     Equipment Recommendations  None recommended by PT    Recommendations for Other Services       Precautions / Restrictions Precautions Precautions: Fall Precaution Comments: no precautions - direct anterior approach    Mobility  Bed Mobility Overal bed mobility: Needs Assistance Bed Mobility: Supine to Sit     Supine to sit: Min assist;HOB elevated     General bed mobility comments: increased time and assist to support R LE off bed  Transfers Overall transfer level: Needs assistance Equipment used: Rolling walker (2 wheeled) Transfers: Sit to/from [employer] Sit to  Stand: Mod assist;From elevated surface Stand pivot transfers: Mod assist       General transfer comment: assisted from elevated bed with one VC on proper hand placement.  Present with severe Kyphotic posture with full floor view.  Also assisted with a toilet transfer with 25% VC's on safety with turns and turn completion with hand placement to control desend.  Required assist to don/doff underpants and stabelize for balance.  Ambulation/Gait Ambulation/Gait assistance: Min assist;+2 safety/equipment Gait Distance (Feet): 20 Feet (10 feet x 2 to and from bathroom) Assistive device: Rolling walker (2 wheeled) Gait Pattern/deviations: Step-to pattern;Step-through pattern;Decreased stance time - right;Antalgic Gait velocity: decr   General Gait Details: assisted with amb to and from bathroom + 2 assist for safety present with severe Kyphotic posture with full floor view.  Slow shuffled steps.  25% VC's on proper sequencing.  Trial RA sats decreased to 87% with no c/o dyspnea just fatigue.  Required increased time to complete distance.  Unsteady with turns and backward gait.  HIGH FALL RISK.   Stairs             Wheelchair Mobility    Modified Rankin (Stroke Patients Only)       Balance                                            Cognition Arousal/Alertness: Awake/alert Behavior During Therapy: [facility]  for tasks assessed/performed Overall Cognitive Status: Within Functional Limits for tasks assessed                                 General Comments: AxO x 2 very pleasant following commands with minimal redirection.  Low vision and hearing but functional.  Some ST memory loss.      Exercises      General Comments        Pertinent Vitals/Pain Pain Assessment: No/denies pain Pain Location: reporting no pain today    Home Living                      Prior Function            PT Goals (current goals can now be found in the care  plan section) Progress towards PT goals: Progressing toward goals    Frequency    Min 3X/week      PT Plan Current plan remains appropriate    Co-evaluation              AM-PAC PT "6 Clicks" Mobility   Outcome Measure  Help needed turning from your back to your side while in a flat bed without using bedrails?: A Lot Help needed moving from lying on your back to sitting on the side of a flat bed without using bedrails?: A Lot Help needed moving to and from a bed to a chair (including a wheelchair)?: A Lot Help needed standing up from a chair using your arms (e.g., wheelchair or bedside chair)?: A Lot Help needed to walk in hospital room?: A Lot Help needed climbing 3-5 steps with a railing? : Total 6 Click Score: 11    End of Session Equipment Utilized During Treatment: Gait belt Activity Tolerance: Patient tolerated treatment well;Patient limited by fatigue Patient left: in chair;with family/visitor present;with call bell/phone within reach Nurse Communication: Mobility status PT Visit Diagnosis: Unsteadiness on feet (R26.81);Difficulty in walking, not elsewhere classified (R26.2)     Time: 8250-5397 PT Time Calculation (min) (ACUTE ONLY): 25 min  Charges:  $Gait Training: 8-22 mins $Therapeutic Activity: 8-22 mins                     Felecia Shelling  PTA Acute  Rehabilitation Services Pager      [phone] Office      [phone]

## 2020-01-27 DIAGNOSIS — R269 Unspecified abnormalities of gait and mobility: Secondary | ICD-10-CM | POA: Diagnosis not present

## 2020-01-27 DIAGNOSIS — R2689 Other abnormalities of gait and mobility: Secondary | ICD-10-CM | POA: Diagnosis not present

## 2020-01-27 DIAGNOSIS — Z4789 Encounter for other orthopedic aftercare: Secondary | ICD-10-CM | POA: Diagnosis not present

## 2020-01-27 DIAGNOSIS — M2550 Pain in unspecified joint: Secondary | ICD-10-CM | POA: Diagnosis not present

## 2020-01-27 DIAGNOSIS — R1310 Dysphagia, unspecified: Secondary | ICD-10-CM | POA: Diagnosis not present

## 2020-01-27 DIAGNOSIS — Z9181 History of falling: Secondary | ICD-10-CM | POA: Diagnosis not present

## 2020-01-27 DIAGNOSIS — M6281 Muscle weakness (generalized): Secondary | ICD-10-CM | POA: Diagnosis not present

## 2020-01-27 DIAGNOSIS — R293 Abnormal posture: Secondary | ICD-10-CM | POA: Diagnosis not present

## 2020-01-27 DIAGNOSIS — K219 Gastro-esophageal reflux disease without esophagitis: Secondary | ICD-10-CM | POA: Diagnosis not present

## 2020-01-27 DIAGNOSIS — E440 Moderate protein-calorie malnutrition: Secondary | ICD-10-CM | POA: Diagnosis not present

## 2020-01-27 DIAGNOSIS — Z7401 Bed confinement status: Secondary | ICD-10-CM | POA: Diagnosis not present

## 2020-01-27 DIAGNOSIS — D649 Anemia, unspecified: Secondary | ICD-10-CM | POA: Diagnosis not present

## 2020-01-27 DIAGNOSIS — S72001S Fracture of unspecified part of neck of right femur, sequela: Secondary | ICD-10-CM | POA: Diagnosis not present

## 2020-01-27 DIAGNOSIS — S72032D Displaced midcervical fracture of left femur, subsequent encounter for closed fracture with routine healing: Secondary | ICD-10-CM | POA: Diagnosis not present

## 2020-01-27 DIAGNOSIS — R2681 Unsteadiness on feet: Secondary | ICD-10-CM | POA: Diagnosis not present

## 2020-01-27 DIAGNOSIS — I2510 Atherosclerotic heart disease of native coronary artery without angina pectoris: Secondary | ICD-10-CM | POA: Diagnosis not present

## 2020-01-27 DIAGNOSIS — R41841 Cognitive communication deficit: Secondary | ICD-10-CM | POA: Diagnosis not present

## 2020-01-27 DIAGNOSIS — F411 Generalized anxiety disorder: Secondary | ICD-10-CM | POA: Diagnosis not present

## 2020-01-27 DIAGNOSIS — S72001A Fracture of unspecified part of neck of right femur, initial encounter for closed fracture: Secondary | ICD-10-CM | POA: Diagnosis not present

## 2020-01-27 DIAGNOSIS — N183 Chronic kidney disease, stage 3 unspecified: Secondary | ICD-10-CM | POA: Diagnosis not present

## 2020-01-27 DIAGNOSIS — S79929A Unspecified injury of unspecified thigh, initial encounter: Secondary | ICD-10-CM | POA: Diagnosis not present

## 2020-01-27 DIAGNOSIS — J449 Chronic obstructive pulmonary disease, unspecified: Secondary | ICD-10-CM | POA: Diagnosis not present

## 2020-01-27 DIAGNOSIS — S72031D Displaced midcervical fracture of right femur, subsequent encounter for closed fracture with routine healing: Secondary | ICD-10-CM | POA: Diagnosis not present

## 2020-01-27 DIAGNOSIS — R1312 Dysphagia, oropharyngeal phase: Secondary | ICD-10-CM | POA: Diagnosis not present

## 2020-01-27 DIAGNOSIS — I639 Cerebral infarction, unspecified: Secondary | ICD-10-CM | POA: Diagnosis not present

## 2020-01-27 DIAGNOSIS — F329 Major depressive disorder, single episode, unspecified: Secondary | ICD-10-CM | POA: Diagnosis not present

## 2020-01-27 LAB — SARS CORONAVIRUS 2 (TAT 6-24 HRS): SARS Coronavirus 2: NEGATIVE

## 2020-01-27 MED ORDER — GUAIFENESIN ER 600 MG PO TB12: 1200.0000 mg | ORAL_TABLET | Freq: Two times a day (BID) | ORAL | 0 refills | Status: AC

## 2020-01-27 MED ORDER — SENNA 8.6 MG PO TABS: 1.0000 | ORAL_TABLET | Freq: Every day | ORAL | 0 refills | Status: DC

## 2020-01-27 MED ORDER — FLEET ENEMA 7-19 GM/118ML RE ENEM
1.0000 | ENEMA | Freq: Once | RECTAL | Status: AC
  Administered 2020-01-27: 1 via RECTAL
  Filled 2020-01-27: qty 1

## 2020-01-27 MED ORDER — POLYETHYLENE GLYCOL 3350 17 G PO PACK: 17.0000 g | PACK | Freq: Every day | ORAL | 0 refills | Status: DC | PRN

## 2020-01-27 MED ORDER — ALPRAZOLAM 0.5 MG PO TABS: 0.5000 mg | ORAL_TABLET | ORAL | 0 refills | Status: DC

## 2020-01-27 MED ORDER — IPRATROPIUM-ALBUTEROL 0.5-2.5 (3) MG/3ML IN SOLN
3.0000 mL | Freq: Four times a day (QID) | RESPIRATORY_TRACT | Status: DC
  Administered 2020-01-27: 3 mL via RESPIRATORY_TRACT

## 2020-01-27 NOTE — Plan of Care (Signed)

## 2020-01-27 NOTE — Progress Notes (Signed)
Patient report called to [facility] place and patient is stable, pain controlled, ivs removed. Daughter called and updated

## 2020-01-27 NOTE — TOC Transition Note (Signed)
Transition of Care [facility]) - CM/SW Discharge Note   Patient Details  Name: Sarah [name] MRN: [id number] Date of Birth: 1921/12/12  [facility] of [facility] CM/SW Contact:  Amada Jupiter, LCSW Phone Number: 01/27/2020, 11:30 AM   Clinical Narrative:    Have received SNF bed offer from [facility] and pt/ daughter have accepted.  MD cleared for dc today.  To transfer via [facility].  RN to call report to [phone].   Final next level of care: Skilled Nursing Facility Barriers to Discharge: Barriers Resolved   Patient Goals and CMS Choice Patient states their goals for this hospitalization and ongoing recovery are:: to eventually return to her [facility] apt at [facility]      Discharge Placement   Existing PASRR number confirmed : 01/24/20          Patient chooses bed at: [facility] Patient to be transferred to facility by: [facility] Name of family member notified: daughter, Sharyl Nimrod Patient and family notified of of transfer: 01/27/20  Discharge Plan and Services In-house Referral: Clinical Social Work              DME Arranged: N/A DME Agency: NA                  Social Determinants of Health (SDOH) Interventions     Readmission Risk Interventions Readmission Risk Prevention Plan 01/24/2020 12/20/2018  Transportation Screening Complete Complete  PCP or Specialist Appt within 5-7 Days Complete -  Some recent data might be hidden

## 2020-01-27 NOTE — Discharge Summary (Signed)
Physician Discharge Summary  Sarah [name] ONG:[id number] DOB: [birth year] DOA: 01/21/2020  PCP: [name], MD  Admit date: 01/21/2020 Discharge date: 01/27/2020  Admitted From: Home Disposition:  Home  Discharge Condition:Stable CODE STATUS:DNR Diet recommendation: Heart Healthy  Brief/Interim Summary:  Patient is a 87 year old female with history of bronchiectasis on chronic steroids, chronic antibiotic therapy, coronary artery disease status , nonhemorrhagic CVA, hypothyroidism, depression, hyperlipidemia who presents to the emergency department with complaints of pain in the right hip after she had unwitnessed fall at independent living facility.  Imaging showed right femoral neck fracture.  Underwent ORIF by orthopedics.  SNF recommended after  PT/OT evaluation.  Patient is medically stable for discharge to skilled nursing facility today.    Following problems were addressed during hospitalization:  Displaced right femoral neck fracture: Status post right hip hemiarthroplasty by orthopedics on January 9.  Continue pain management and supportive care.  Started on aspirin twice a day for DVT prophylaxis. PT/OT recommended SNF on  discharge  Acute hypoxic respiratory failure:   Requiring 1-2L of oxygen per minute.  She is not on oxygen at home.  Continue incentive spirometry. Follow up CXR done on 01/25/20 showed unchanged chronic increased opacity at the left lung base likely result of atelectasis.  She has a history of bronchiectasis and follows with Dr. Delton Coombes.  If she requires oxygen on discharge, it is reasonable.  History of bronchiectasis: On chronic steroid and antibiotic therapy.  Follows with pulmonology as an outpatient.  Continue home regimen, chest vest, bronchodilators as needed, Mucinex.  Leukocytosis:Thought to be  secondary to steroid use. Continue intermittent monitoring  Hypertension: Currently blood stable.  Continue Coreg, Norvasc  Coronary artery disease:  denies any chest pain.  On Plavix, Coreg at home.  History of nonhemorrhagic CVA: Ambulates with the help of walker.  On Plavix at home  Vitamin D deficiency: Continue supplementation  Moderate size hiatal hernia/GERD: Continue PPI twice a day.  Hypothyroidism: On Synthyroid  General anxiety disorder/depression: Continue home medications    Discharge Diagnoses:  Principal Problem:   Closed displaced fracture of right femoral neck (HCC) Active Problems:   Essential hypertension   Allergic rhinitis   BRONCHIECTASIS   Idiopathic peripheral neuropathy   Depression   Hypothyroidism   Arthritis   Anxiety   Malignant hypertension   TIA (transient ischemic attack)   Chronic diastolic CHF (congestive heart failure) (HCC)   HLD (hyperlipidemia)   HTN (hypertension)   GERD (gastroesophageal reflux disease)   History of CVA (cerebrovascular accident)   CAD (coronary artery disease)   Age-related physical debility   Bilateral hearing loss   Chronic kidney disease, stage 3 unspecified (HCC)   Generalized anxiety disorder   Iron deficiency anemia   Vitamin D deficiency    Discharge Instructions  Discharge Instructions    Diet - low sodium heart healthy   Complete by: As directed    Discharge instructions   Complete by: As directed    1)Follow up with orthopedics in 2 weeks. Name and Number of  the provider has been attached 2)take prescribed medications as instructed 3)Do a CBC test in a week to check your hemoglobin   Increase activity slowly   Complete by: As directed    No wound care   Complete by: As directed      Allergies as of 01/27/2020      Reactions   Codeine Anaphylaxis   Hydrocodone Nausea And Vomiting, Other (See Comments)   SYNCOPE AND BRADYCARDIA   Isosorbide  Other (See Comments)   BRADYCARDIA   Isosorbide Nitrate Other (See Comments)   BRADYCARDIA   Oxycodone Palpitations   Rapid heart beat   Clarithromycin Nausea And Vomiting   Has patient  had a PCN reaction causing immediate rash, facial/tongue/throat swelling, SOB or lightheadedness with hypotension: Unknown Has patient had a PCN reaction causing severe rash involving mucus membranes or skin necrosis: Unknown Has patient had a PCN reaction that required hospitalization: Unknown Has patient had a PCN reaction occurring within the last 10 years: Unknown If all of the above answers are "NO", then may proceed with Cephalosporin use.   Fiorinal [butalbital-aspirin-caffeine] Swelling, Other (See Comments)   Facial swelling   Levofloxacin Nausea And Vomiting, Other (See Comments)   SYNCOPE ALSO   Augmentin [amoxicillin-pot Clavulanate] Diarrhea   Doxycycline    Sweats and aches      Medication List    TAKE these medications   acetaminophen 500 MG tablet Commonly known as: TYLENOL Take 500 mg by mouth every 6 (six) hours as needed for moderate pain.   albuterol (2.5 MG/3ML) 0.083% nebulizer solution Commonly known as: PROVENTIL Take 3 mLs (2.5 mg total) by nebulization in the morning and at bedtime. DX: J47.9   ALPRAZolam 0.5 MG tablet Commonly known as: XANAX Take 1 tablet (0.5 mg total) by mouth as directed. Take 1 tablet (0.5 mg) BID at (8 am & 8 pm) & DAILY PRN FOR ANXIETY   amLODipine 5 MG tablet Commonly known as: NORVASC Take 2.5 mg by mouth every evening. What changed: Another medication with the same name was removed. Continue taking this medication, and follow the directions you see here.   aspirin 81 MG chewable tablet Chew 1 tablet (81 mg total) by mouth 2 (two) times daily with a meal.   azithromycin 250 MG tablet Commonly known as: ZITHROMAX Take 250-500 mg by mouth as directed. Take 2 tablets on Day 1. Take 1 tablet daily for Days 2-5. Alternate Every Other Month With Cefdinir What changed: additional instructions   calcium carbonate 500 MG chewable tablet Commonly known as: TUMS - dosed in mg elemental calcium Chew 1 tablet by mouth every 8 (eight)  hours as needed for indigestion or heartburn.   carvedilol 6.25 MG tablet Commonly known as: COREG TAKE 1 TABLET(6.25 MG) BY MOUTH TWICE DAILY   cefdinir 300 MG capsule Commonly known as: OMNICEF Take 300 mg by mouth as directed. 6 Day Course. Take 1 capsule (300 mg) BID. Start on 26th Of The Month. Alternate Every Other Month With Azithromycin   clopidogrel 75 MG tablet Commonly known as: PLAVIX TAKE 1 TABLET(75 MG) BY MOUTH DAILY   DULoxetine 20 MG capsule Commonly known as: CYMBALTA Take 20 mg by mouth 3 (three) times daily.   escitalopram 5 MG tablet Commonly known as: LEXAPRO Take 5 mg by mouth daily.   Fusion Plus Caps Take 1 capsule by mouth daily.   guaiFENesin 600 MG 12 hr tablet Commonly known as: MUCINEX Take 2 tablets (1,200 mg total) by mouth 2 (two) times daily for 7 days.   Jobst Active 20-37mmHg Medium Misc See admin instructions.   levothyroxine 50 MCG tablet Commonly known as: SYNTHROID Take 50 mcg by mouth daily.   mupirocin ointment 2 % Commonly known as: BACTROBAN Apply to affected digits once daily. What changed:   how much to take  how to take this  when to take this  reasons to take this  additional instructions   NONFORMULARY OR COMPOUNDED ITEM  [facility]:  Neuropathy Cream #11 - Bupivacaine 1%, Doxepin 3%, Gabapentin 6%, Pentoxifylline 3%, Topiramate 1%. Apply 1-2 grams to affected areas 3-4 times a day. What changed: additional instructions   pantoprazole 40 MG tablet Commonly known as: PROTONIX Take 40 mg by mouth 2 (two) times daily.   polyethylene glycol 17 g packet Commonly known as: MIRALAX / GLYCOLAX Take 17 g by mouth daily as needed for mild constipation.   predniSONE 10 MG tablet Commonly known as: DELTASONE TAKE 1 TABLET(10 MG) BY MOUTH DAILY WITH BREAKFAST   Rocklatan 0.02-0.005 % Soln Generic drug: Netarsudil-Latanoprost Place 1 drop into both eyes at bedtime.   SENOKOT PO Take 1 tablet by mouth  daily as needed (constipation).   Simbrinza 1-0.2 % Susp Generic drug: Brinzolamide-Brimonidine Place 1 drop into the left eye in the morning, at noon, and at bedtime.   traMADol 50 MG tablet Commonly known as: ULTRAM Take 1 tablet (50 mg total) by mouth every 6 (six) hours as needed for moderate pain.       Contact information for follow-up providers    Swinteck, Arlys John, MD. Schedule an appointment as soon as possible for a visit in 2 weeks.   Specialty: Orthopedic Surgery Why: For wound re-check, For suture removal Contact information: [address] [place] [address] [phone]            Contact information for after-discharge care    Destination    [facility] .   Service: Skilled Nursing Contact information: [address] [place] [address] [phone]                 Allergies  Allergen Reactions  . Codeine Anaphylaxis  . Hydrocodone Nausea And Vomiting and Other (See Comments)    SYNCOPE AND BRADYCARDIA  . Isosorbide Other (See Comments)    BRADYCARDIA  . Isosorbide Nitrate Other (See Comments)    BRADYCARDIA  . Oxycodone Palpitations    Rapid heart beat  . Clarithromycin Nausea And Vomiting    Has patient had a PCN reaction causing immediate rash, facial/tongue/throat swelling, SOB or lightheadedness with hypotension: Unknown Has patient had a PCN reaction causing severe rash involving mucus membranes or skin necrosis: Unknown Has patient had a PCN reaction that required hospitalization: Unknown Has patient had a PCN reaction occurring within the last 10 years: Unknown If all of the above answers are "NO", then may proceed with Cephalosporin use.   . Fiorinal [Butalbital-Aspirin-Caffeine] Swelling and Other (See Comments)    Facial swelling   . Levofloxacin Nausea And Vomiting and Other (See Comments)    SYNCOPE ALSO  . Augmentin [Amoxicillin-Pot Clavulanate] Diarrhea  . Doxycycline      Sweats and aches    Consultations: orthopedics  Procedures/Studies: DG Ribs Bilateral W/Chest  Result Date: 01/22/2020 CLINICAL DATA:  87 year old female with shortness of breath status post fall. EXAM: BILATERAL RIBS AND CHEST - 4+ VIEW COMPARISON:  01/21/2020 FINDINGS: No fracture or other bone lesions are seen involving the ribs. There is no evidence of pneumothorax or pleural effusion. Similar appearing perifissural nodular opacity in the right upper lobe. Similar appearing moderate hiatal hernia. Heart size and mediastinal contours are unchanged. IMPRESSION: 1. No evidence of acute rib fracture. 2. Similar appearing right upper lobe perifissural nodular opacity. 3. Unchanged moderate hiatal hernia. Electronically Signed   By: Marliss Coots MD   On: 01/22/2020 10:25   Pelvis Portable  Result Date: 01/23/2020 CLINICAL DATA:  Status post RIGHT  hip hemiarthroplasty EXAM: PORTABLE PELVIS 1-2 VIEWS COMPARISON:  None. FINDINGS: RIGHT hip arthroplasty hardware appears intact and appropriately positioned. Osseous alignment is anatomic. Expected postsurgical changes of the overlying soft tissues. IMPRESSION: Status post RIGHT hip arthroplasty. No evidence of surgical complicating feature. Electronically Signed   By: Bary Richard M.D.   On: 01/23/2020 14:45   DG CHEST PORT 1 VIEW  Result Date: 01/25/2020 CLINICAL DATA:  Dyspnea EXAM: PORTABLE CHEST 1 VIEW COMPARISON:  Chest radiograph 01/22/2020 FINDINGS: Heart size is within normal limits. No significant from wrist to congestion. Chronic increased left basilar opacity is unchanged. Nodular opacity in the right mid lung unchanged from prior exam. IMPRESSION: Unchanged chronic increased opacity at the left lung base likely result of atelectasis. No new acute abnormality identified. Soft Electronically Signed   By: Acquanetta Belling M.D.   On: 01/25/2020 08:34   DG Chest Port 1 View  Result Date: 01/21/2020 CLINICAL DATA:  Right hip fracture following a fall.  Click EXAM: PORTABLE CHEST 1 VIEW COMPARISON:  07/30/2019.  Chest CTA dated 07/02/2019. FINDINGS: Normal sized heart. Stable moderate-sized hiatal hernia. Scarring in the left mid to lower lung zone without significant change. Stable small irregular nodule in the right upper lobe. This area was obscured by multiple opacities in the right upper lobe on 07/02/2019, including multiple nodular opacities. No fracture or pneumothorax seen. IMPRESSION: 1. No fracture or pneumothorax. 2. Stable irregular nodule in the right upper lobe. 3. Stable moderate-sized hiatal hernia. Electronically Signed   By: Beckie Salts M.D.   On: 01/21/2020 14:53   DG C-Arm 1-60 Min-No Report  Result Date: 01/23/2020 Fluoroscopy was utilized by the requesting physician.  No radiographic interpretation.   DG HIP OPERATIVE UNILAT W OR W/O PELVIS RIGHT  Result Date: 01/23/2020 CLINICAL DATA:  RIGHT hemiarthroplasty EXAM: OPERATIVE RIGHT HIP (WITH PELVIS IF PERFORMED) 2 VIEWS TECHNIQUE: Fluoroscopic spot image(s) were submitted for interpretation post-operatively. COMPARISON:  None. FINDINGS: Fluoroscopic spot images of the RIGHT hip show RIGHT hip hemiarthroplasty hardware. Hardware appears intact and appropriately positioned. Fluoroscopy provided for 4 seconds. IMPRESSION: Intraoperative images demonstrating RIGHT hip hemiarthroplasty hardware. No evidence of surgical complicating feature. Electronically Signed   By: Bary Richard M.D.   On: 01/23/2020 13:18   DG Hip Unilat With Pelvis 2-3 Views Right  Result Date: 01/21/2020 CLINICAL DATA:  Right hip pain following a fall. EXAM: DG HIP (WITH OR WITHOUT PELVIS) 2-3V RIGHT COMPARISON:  None. FINDINGS: Right femoral neck fracture with proximal displacement and varus angulation of the distal fragment IMPRESSION: Right femoral neck fracture. Electronically Signed   By: Beckie Salts M.D.   On: 01/21/2020 14:48   DG FEMUR, MIN 2 VIEWS RIGHT  Result Date: 01/21/2020 CLINICAL DATA:  Right hip  pain following a fall. EXAM: RIGHT FEMUR 2 VIEWS COMPARISON:  Right hip radiographs obtained at the same time. FINDINGS: Previously described right femoral neck fracture. No additional fractures or dislocations. IMPRESSION: Previously described right femoral neck fracture. Electronically Signed   By: Beckie Salts M.D.   On: 01/21/2020 14:49       Subjective: Patient seen and examined at bedside this morning. Hemodynamically stable for discharge today.  Discharge Exam: Vitals:   01/27/20 0454 01/27/20 0824  BP: (!) 166/84   Pulse: 77   Resp: 20   Temp: 98 F (36.7 C)   SpO2: 95% 92%   Vitals:   01/26/20 2013 01/26/20 2133 01/27/20 0454 01/27/20 0824  BP:  140/81 (!) 166/84   Pulse:  81 77   Resp:  18 20   Temp:  97.6 F (36.4 C) 98 F (36.7 C)   TempSrc:  Oral Oral   SpO2: 91% 96% 95% 92%  Weight:      Height:        General: Pt is alert, awake, not in acute distress Cardiovascular: RRR, S1/S2 +, no rubs, no gallops Respiratory: Diminished bilateral air sounds but no wheezes or crackles Abdominal: Soft, NT, ND, bowel sounds + Extremities: no edema, no cyanosis, clean surgical wound on the right hip    The results of significant diagnostics from this hospitalization (including imaging, microbiology, ancillary and laboratory) are listed below for reference.     Microbiology: Recent Results (from the past 240 hour(s))  SARS CORONAVIRUS 2 (TAT 6-24 HRS) Nasopharyngeal Nasopharyngeal Swab     Status: None   Collection Time: 01/21/20  1:38 PM   Specimen: Nasopharyngeal Swab  Result Value Ref Range Status   SARS Coronavirus 2 NEGATIVE NEGATIVE Final    Comment: (NOTE) SARS-CoV-2 target nucleic acids are NOT DETECTED.  The SARS-CoV-2 RNA is generally detectable in upper and lower respiratory specimens during the acute phase of infection. Negative results do not preclude SARS-CoV-2 infection, do not rule out co-infections with other pathogens, and should not be used as  the sole basis for treatment or other patient management decisions. Negative results must be combined with clinical observations, patient history, and epidemiological information. The expected result is Negative.  Fact Sheet for Patients: HairSlick.nohttps://www.fda.gov/media/138098/download  Fact Sheet for Healthcare Providers: quierodirigir.comhttps://www.fda.gov/media/138095/download  This test is not yet approved or cleared by the [place] FDA and  has been authorized for detection and/or diagnosis of SARS-CoV-2 by FDA under an Emergency Use Authorization (EUA). This EUA will remain  in effect (meaning this test can be used) for the duration of the COVID-19 declaration under Se ction 564(b)(1) of the Act, 21 U.S.C. section 360bbb-3(b)(1), unless the authorization is terminated or revoked sooner.  Performed at [facility], [address] [place], [place] [address]   Culture, Urine     Status: Abnormal   Collection Time: 01/21/20  6:34 PM   Specimen: Urine, Catheterized  Result Value Ref Range Status   Specimen Description   Final    URINE, CATHETERIZED Performed at [facility], [address] [place], [place] [address]    Special Requests   Final    NONE Performed at [facility], [address] [place], [place] [address]    Culture (A)  Final    <10,000 COLONIES/mL INSIGNIFICANT GROWTH Performed at [facility], [address] [place], [place] [address]    Report Status 01/23/2020 FINAL  Final  Surgical pcr screen     Status: None   Collection Time: 01/23/20  9:34 AM   Specimen: Nasal Mucosa; Nasal Swab  Result Value Ref Range Status   MRSA, PCR NEGATIVE NEGATIVE Final   Staphylococcus aureus NEGATIVE NEGATIVE Final    Comment: (NOTE) The Xpert SA Assay (FDA approved for NASAL specimens in patients 85 years of age and older), is one component of a comprehensive surveillance program. It is not intended to diagnose infection nor  to guide or monitor treatment. Performed at [facility], [address] [place], [place] [address]   SARS CORONAVIRUS 2 (TAT 6-24 HRS) Nasopharyngeal Nasopharyngeal Swab     Status: None   Collection Time: 01/26/20  6:30 PM   Specimen: Nasopharyngeal Swab  Result  Value Ref Range Status   SARS Coronavirus 2 NEGATIVE NEGATIVE Final    Comment: (NOTE) SARS-CoV-2 target nucleic acids are NOT DETECTED.  The SARS-CoV-2 RNA is generally detectable in upper and lower respiratory specimens during the acute phase of infection. Negative results do not preclude SARS-CoV-2 infection, do not rule out co-infections with other pathogens, and should not be used as the sole basis for treatment or other patient management decisions. Negative results must be combined with clinical observations, patient history, and epidemiological information. The expected result is Negative.  Fact Sheet for Patients: HairSlick.no  Fact Sheet for Healthcare Providers: quierodirigir.com  This test is not yet approved or cleared by the [place] FDA and  has been authorized for detection and/or diagnosis of SARS-CoV-2 by FDA under an Emergency Use Authorization (EUA). This EUA will remain  in effect (meaning this test can be used) for the duration of the COVID-19 declaration under Se ction 564(b)(1) of the Act, 21 U.S.C. section 360bbb-3(b)(1), unless the authorization is terminated or revoked sooner.  Performed at [facility], [address], [place], [place] [address]      Labs: BNP (last 3 results) Recent Labs    07/02/19 1807  BNP 183.9*   Basic Metabolic Panel: Recent Labs  Lab 01/21/20 1338 01/22/20 0423 01/23/20 0314 01/24/20 0337 01/26/20 0332  NA 136 138 138 134* 135  K 4.8 4.4 4.2 4.3 4.4  CL 95* 99 100 98 97*  CO2 32 29 30 29 29   GLUCOSE 153* 91 75 158* 108*  BUN 14 14 13 17 19   CREATININE 1.03*  0.89 0.81 0.89 0.69  CALCIUM 8.7* 8.4* 8.5* 8.1* 8.2*   Liver Function Tests: Recent Labs  Lab 01/22/20 0423  ALBUMIN 3.4*   No results for input(s): LIPASE, AMYLASE in the last 168 hours. No results for input(s): AMMONIA in the last 168 hours. CBC: Recent Labs  Lab 01/21/20 1338 01/22/20 0423 01/23/20 0314 01/24/20 0337 01/25/20 0340 01/26/20 0332  WBC 15.4* 11.7* 11.7* 13.7* 21.1* 16.4*  NEUTROABS 14.1*  --  9.5*  --   --   --   HGB 16.4* 14.3 13.7 12.0 12.4 11.8*  HCT 52.7* 45.2 43.7 37.9 40.6 37.4  MCV 103.1* 103.9* 105.3* 102.2* 108.3* 101.9*  PLT 277 248 191 174 142* 219   Cardiac Enzymes: No results for input(s): CKTOTAL, CKMB, CKMBINDEX, TROPONINI in the last 168 hours. BNP: Invalid input(s): POCBNP CBG: No results for input(s): GLUCAP in the last 168 hours. D-Dimer No results for input(s): DDIMER in the last 72 hours. Hgb A1c No results for input(s): HGBA1C in the last 72 hours. Lipid Profile No results for input(s): CHOL, HDL, LDLCALC, TRIG, CHOLHDL, LDLDIRECT in the last 72 hours. Thyroid function studies No results for input(s): TSH, T4TOTAL, T3FREE, THYROIDAB in the last 72 hours.  Invalid input(s): FREET3 Anemia work up No results for input(s): VITAMINB12, FOLATE, FERRITIN, TIBC, IRON, RETICCTPCT in the last 72 hours. Urinalysis    Component Value Date/Time   COLORURINE YELLOW 01/21/2020 1834   APPEARANCEUR CLOUDY (A) 01/21/2020 1834   LABSPEC 1.010 01/21/2020 1834   PHURINE 8.0 01/21/2020 1834   GLUCOSEU NEGATIVE 01/21/2020 1834   HGBUR NEGATIVE 01/21/2020 1834   BILIRUBINUR NEGATIVE 01/21/2020 1834   KETONESUR NEGATIVE 01/21/2020 1834   PROTEINUR NEGATIVE 01/21/2020 1834   UROBILINOGEN 1.0 10/06/2014 1730   NITRITE NEGATIVE 01/21/2020 1834   LEUKOCYTESUR NEGATIVE 01/21/2020 1834   Sepsis Labs Invalid input(s): PROCALCITONIN,  WBC,  LACTICIDVEN Microbiology Recent  Results (from the past 240 hour(s))  SARS CORONAVIRUS 2 (TAT 6-24 HRS)  Nasopharyngeal Nasopharyngeal Swab     Status: None   Collection Time: 01/21/20  1:38 PM   Specimen: Nasopharyngeal Swab  Result Value Ref Range Status   SARS Coronavirus 2 NEGATIVE NEGATIVE Final    Comment: (NOTE) SARS-CoV-2 target nucleic acids are NOT DETECTED.  The SARS-CoV-2 RNA is generally detectable in upper and lower respiratory specimens during the acute phase of infection. Negative results do not preclude SARS-CoV-2 infection, do not rule out co-infections with other pathogens, and should not be used as the sole basis for treatment or other patient management decisions. Negative results must be combined with clinical observations, patient history, and epidemiological information. The expected result is Negative.  Fact Sheet for Patients: HairSlick.no  Fact Sheet for Healthcare Providers: quierodirigir.com  This test is not yet approved or cleared by the [place] FDA and  has been authorized for detection and/or diagnosis of SARS-CoV-2 by FDA under an Emergency Use Authorization (EUA). This EUA will remain  in effect (meaning this test can be used) for the duration of the COVID-19 declaration under Se ction 564(b)(1) of the Act, 21 U.S.C. section 360bbb-3(b)(1), unless the authorization is terminated or revoked sooner.  Performed at [facility], [address], [place], [place] [address]   Culture, Urine     Status: Abnormal   Collection Time: 01/21/20  6:34 PM   Specimen: Urine, Catheterized  Result Value Ref Range Status   Specimen Description   Final    URINE, CATHETERIZED Performed at [facility], [address] [place], [place] [address]    Special Requests   Final    NONE Performed at [facility], [address] [place], [place] [address]    Culture (A)  Final    <10,000 COLONIES/mL INSIGNIFICANT GROWTH Performed at [facility], [address], [place], [place] [address]    Report Status 01/23/2020 FINAL  Final  Surgical pcr screen     Status: None   Collection Time: 01/23/20  9:34 AM   Specimen: Nasal Mucosa; Nasal Swab  Result Value Ref Range Status   MRSA, PCR NEGATIVE NEGATIVE Final   Staphylococcus aureus NEGATIVE NEGATIVE Final    Comment: (NOTE) The Xpert SA Assay (FDA approved for NASAL specimens in patients 64 years of age and older), is one component of a comprehensive surveillance program. It is not intended to diagnose infection nor to guide or monitor treatment. Performed at [facility], [address], [place], [place] [address]   SARS CORONAVIRUS 2 (TAT 6-24 HRS) Nasopharyngeal Nasopharyngeal Swab     Status: None   Collection Time: 01/26/20  6:30 PM   Specimen: Nasopharyngeal Swab  Result Value Ref Range Status   SARS Coronavirus 2 NEGATIVE NEGATIVE Final    Comment: (NOTE) SARS-CoV-2 target nucleic acids are NOT DETECTED.  The SARS-CoV-2 RNA is generally detectable in upper and lower respiratory specimens during the acute phase of infection. Negative results do not preclude SARS-CoV-2 infection, do not rule out co-infections with other pathogens, and should not be used as the sole basis for treatment or other patient management decisions. Negative results must be combined with clinical observations, patient history, and epidemiological information. The expected result is Negative.  Fact Sheet for Patients: HairSlick.no  Fact Sheet for Healthcare Providers: quierodirigir.com  This test is not yet approved or cleared by the [place] FDA and  has  been authorized for detection and/or diagnosis of SARS-CoV-2 by FDA under an Emergency Use Authorization (EUA). This EUA will remain  in effect (meaning this test can be used) for the duration of the COVID-19 declaration under Se ction 564(b)(1) of the Act, 21  U.S.C. section 360bbb-3(b)(1), unless the authorization is terminated or revoked sooner.  Performed at [facility], [address] [place], [place] [address]     Please note: You were cared for by a hospitalist during your hospital stay. Once you are discharged, your primary care physician will handle any further medical issues. Please note that NO REFILLS for any discharge medications will be authorized once you are discharged, as it is imperative that you return to your primary care physician (or establish a relationship with a primary care physician if you do not have one) for your post hospital discharge needs so that they can reassess your need for medications and monitor your lab values.    Time coordinating discharge: 40 minutes  SIGNED:   Burnadette PopAmrit Coda Mathey, MD  [facility] 01/27/2020, 11:36 AM Pager [phone]  If 7PM-7AM, please contact night-coverage [email or web address] Password [email or web address]

## 2020-01-27 NOTE — Care Management Important Message (Signed)
Important Message  Patient Details IM Letter placed in Patients room. Name: Sarah [name] MRN: [id number] Date of Birth: Mar 18, 1921   Medicare Important Message Given:  Yes     Caren Macadam 01/27/2020, 12:23 PM

## 2020-01-28 DIAGNOSIS — S72001S Fracture of unspecified part of neck of right femur, sequela: Secondary | ICD-10-CM | POA: Diagnosis not present

## 2020-01-28 DIAGNOSIS — I639 Cerebral infarction, unspecified: Secondary | ICD-10-CM | POA: Diagnosis not present

## 2020-01-28 DIAGNOSIS — K219 Gastro-esophageal reflux disease without esophagitis: Secondary | ICD-10-CM | POA: Diagnosis not present

## 2020-01-28 DIAGNOSIS — F411 Generalized anxiety disorder: Secondary | ICD-10-CM | POA: Diagnosis not present

## 2020-01-28 DIAGNOSIS — R2681 Unsteadiness on feet: Secondary | ICD-10-CM | POA: Diagnosis not present

## 2020-01-28 DIAGNOSIS — E440 Moderate protein-calorie malnutrition: Secondary | ICD-10-CM | POA: Diagnosis not present

## 2020-01-28 DIAGNOSIS — I2510 Atherosclerotic heart disease of native coronary artery without angina pectoris: Secondary | ICD-10-CM | POA: Diagnosis not present

## 2020-01-28 DIAGNOSIS — M6281 Muscle weakness (generalized): Secondary | ICD-10-CM | POA: Diagnosis not present

## 2020-01-28 DIAGNOSIS — N183 Chronic kidney disease, stage 3 unspecified: Secondary | ICD-10-CM | POA: Diagnosis not present

## 2020-01-28 DIAGNOSIS — D649 Anemia, unspecified: Secondary | ICD-10-CM | POA: Diagnosis not present

## 2020-01-28 DIAGNOSIS — J449 Chronic obstructive pulmonary disease, unspecified: Secondary | ICD-10-CM | POA: Diagnosis not present

## 2020-01-28 DIAGNOSIS — F329 Major depressive disorder, single episode, unspecified: Secondary | ICD-10-CM | POA: Diagnosis not present

## 2020-02-01 DIAGNOSIS — S72001S Fracture of unspecified part of neck of right femur, sequela: Secondary | ICD-10-CM | POA: Diagnosis not present

## 2020-02-01 DIAGNOSIS — J449 Chronic obstructive pulmonary disease, unspecified: Secondary | ICD-10-CM | POA: Diagnosis not present

## 2020-02-01 DIAGNOSIS — N183 Chronic kidney disease, stage 3 unspecified: Secondary | ICD-10-CM | POA: Diagnosis not present

## 2020-02-01 DIAGNOSIS — I2510 Atherosclerotic heart disease of native coronary artery without angina pectoris: Secondary | ICD-10-CM | POA: Diagnosis not present

## 2020-02-01 DIAGNOSIS — K219 Gastro-esophageal reflux disease without esophagitis: Secondary | ICD-10-CM | POA: Diagnosis not present

## 2020-02-01 DIAGNOSIS — M6281 Muscle weakness (generalized): Secondary | ICD-10-CM | POA: Diagnosis not present

## 2020-02-01 DIAGNOSIS — E440 Moderate protein-calorie malnutrition: Secondary | ICD-10-CM | POA: Diagnosis not present

## 2020-02-01 DIAGNOSIS — D649 Anemia, unspecified: Secondary | ICD-10-CM | POA: Diagnosis not present

## 2020-02-01 DIAGNOSIS — I639 Cerebral infarction, unspecified: Secondary | ICD-10-CM | POA: Diagnosis not present

## 2020-02-01 DIAGNOSIS — R2681 Unsteadiness on feet: Secondary | ICD-10-CM | POA: Diagnosis not present

## 2020-02-01 DIAGNOSIS — F411 Generalized anxiety disorder: Secondary | ICD-10-CM | POA: Diagnosis not present

## 2020-02-01 DIAGNOSIS — F329 Major depressive disorder, single episode, unspecified: Secondary | ICD-10-CM | POA: Diagnosis not present

## 2020-02-03 DIAGNOSIS — J449 Chronic obstructive pulmonary disease, unspecified: Secondary | ICD-10-CM | POA: Diagnosis not present

## 2020-02-03 DIAGNOSIS — S72001S Fracture of unspecified part of neck of right femur, sequela: Secondary | ICD-10-CM | POA: Diagnosis not present

## 2020-02-03 DIAGNOSIS — K219 Gastro-esophageal reflux disease without esophagitis: Secondary | ICD-10-CM | POA: Diagnosis not present

## 2020-02-03 DIAGNOSIS — R2681 Unsteadiness on feet: Secondary | ICD-10-CM | POA: Diagnosis not present

## 2020-02-03 DIAGNOSIS — E440 Moderate protein-calorie malnutrition: Secondary | ICD-10-CM | POA: Diagnosis not present

## 2020-02-03 DIAGNOSIS — F411 Generalized anxiety disorder: Secondary | ICD-10-CM | POA: Diagnosis not present

## 2020-02-03 DIAGNOSIS — M6281 Muscle weakness (generalized): Secondary | ICD-10-CM | POA: Diagnosis not present

## 2020-02-03 DIAGNOSIS — D649 Anemia, unspecified: Secondary | ICD-10-CM | POA: Diagnosis not present

## 2020-02-03 DIAGNOSIS — F329 Major depressive disorder, single episode, unspecified: Secondary | ICD-10-CM | POA: Diagnosis not present

## 2020-02-03 DIAGNOSIS — I639 Cerebral infarction, unspecified: Secondary | ICD-10-CM | POA: Diagnosis not present

## 2020-02-03 DIAGNOSIS — I2510 Atherosclerotic heart disease of native coronary artery without angina pectoris: Secondary | ICD-10-CM | POA: Diagnosis not present

## 2020-02-03 DIAGNOSIS — N183 Chronic kidney disease, stage 3 unspecified: Secondary | ICD-10-CM | POA: Diagnosis not present

## 2020-02-04 ENCOUNTER — Telehealth: Payer: Self-pay | Admitting: Emergency Medicine

## 2020-02-04 NOTE — Telephone Encounter (Signed)
Spoke with patient's daughter. She stated that the patient has been through a lot in the past few weeks. She fell and had to have emergency hip replacement surgery. She is currently recovering at [facility]. Due to it being difficult for her to get out for appointments, she has requested to have her visit for next week changed to a MyChart video visit.   Appointment has been changed. She is aware to start logging in around 4pm to work out any links before 415pm.   Nothing further needed at time of call.

## 2020-02-07 DIAGNOSIS — K219 Gastro-esophageal reflux disease without esophagitis: Secondary | ICD-10-CM | POA: Diagnosis not present

## 2020-02-07 DIAGNOSIS — D649 Anemia, unspecified: Secondary | ICD-10-CM | POA: Diagnosis not present

## 2020-02-07 DIAGNOSIS — M6281 Muscle weakness (generalized): Secondary | ICD-10-CM | POA: Diagnosis not present

## 2020-02-07 DIAGNOSIS — E440 Moderate protein-calorie malnutrition: Secondary | ICD-10-CM | POA: Diagnosis not present

## 2020-02-07 DIAGNOSIS — I2510 Atherosclerotic heart disease of native coronary artery without angina pectoris: Secondary | ICD-10-CM | POA: Diagnosis not present

## 2020-02-07 DIAGNOSIS — F411 Generalized anxiety disorder: Secondary | ICD-10-CM | POA: Diagnosis not present

## 2020-02-07 DIAGNOSIS — F329 Major depressive disorder, single episode, unspecified: Secondary | ICD-10-CM | POA: Diagnosis not present

## 2020-02-07 DIAGNOSIS — S72032D Displaced midcervical fracture of left femur, subsequent encounter for closed fracture with routine healing: Secondary | ICD-10-CM | POA: Diagnosis not present

## 2020-02-07 DIAGNOSIS — S72031D Displaced midcervical fracture of right femur, subsequent encounter for closed fracture with routine healing: Secondary | ICD-10-CM | POA: Diagnosis not present

## 2020-02-07 DIAGNOSIS — N183 Chronic kidney disease, stage 3 unspecified: Secondary | ICD-10-CM | POA: Diagnosis not present

## 2020-02-07 DIAGNOSIS — S72001S Fracture of unspecified part of neck of right femur, sequela: Secondary | ICD-10-CM | POA: Diagnosis not present

## 2020-02-07 DIAGNOSIS — I639 Cerebral infarction, unspecified: Secondary | ICD-10-CM | POA: Diagnosis not present

## 2020-02-07 DIAGNOSIS — J449 Chronic obstructive pulmonary disease, unspecified: Secondary | ICD-10-CM | POA: Diagnosis not present

## 2020-02-07 DIAGNOSIS — R2681 Unsteadiness on feet: Secondary | ICD-10-CM | POA: Diagnosis not present

## 2020-02-09 ENCOUNTER — Telehealth: Payer: Medicare Other | Admitting: Emergency Medicine

## 2020-02-09 DIAGNOSIS — R2689 Other abnormalities of gait and mobility: Secondary | ICD-10-CM | POA: Diagnosis not present

## 2020-02-09 DIAGNOSIS — M25551 Pain in right hip: Secondary | ICD-10-CM | POA: Diagnosis not present

## 2020-02-09 DIAGNOSIS — M6281 Muscle weakness (generalized): Secondary | ICD-10-CM | POA: Diagnosis not present

## 2020-02-09 DIAGNOSIS — S72001S Fracture of unspecified part of neck of right femur, sequela: Secondary | ICD-10-CM | POA: Diagnosis not present

## 2020-02-09 DIAGNOSIS — R2681 Unsteadiness on feet: Secondary | ICD-10-CM | POA: Diagnosis not present

## 2020-02-09 DIAGNOSIS — R278 Other lack of coordination: Secondary | ICD-10-CM | POA: Diagnosis not present

## 2020-02-10 DIAGNOSIS — M25551 Pain in right hip: Secondary | ICD-10-CM | POA: Diagnosis not present

## 2020-02-10 DIAGNOSIS — R3911 Hesitancy of micturition: Secondary | ICD-10-CM | POA: Diagnosis not present

## 2020-02-10 DIAGNOSIS — R278 Other lack of coordination: Secondary | ICD-10-CM | POA: Diagnosis not present

## 2020-02-10 DIAGNOSIS — S72001S Fracture of unspecified part of neck of right femur, sequela: Secondary | ICD-10-CM | POA: Diagnosis not present

## 2020-02-10 DIAGNOSIS — M6281 Muscle weakness (generalized): Secondary | ICD-10-CM | POA: Diagnosis not present

## 2020-02-10 DIAGNOSIS — R2681 Unsteadiness on feet: Secondary | ICD-10-CM | POA: Diagnosis not present

## 2020-02-10 DIAGNOSIS — R2689 Other abnormalities of gait and mobility: Secondary | ICD-10-CM | POA: Diagnosis not present

## 2020-02-10 DIAGNOSIS — Z96641 Presence of right artificial hip joint: Secondary | ICD-10-CM | POA: Diagnosis not present

## 2020-02-11 ENCOUNTER — Other Ambulatory Visit: Payer: Self-pay | Admitting: *Deleted

## 2020-02-11 DIAGNOSIS — R2681 Unsteadiness on feet: Secondary | ICD-10-CM | POA: Diagnosis not present

## 2020-02-11 DIAGNOSIS — R278 Other lack of coordination: Secondary | ICD-10-CM | POA: Diagnosis not present

## 2020-02-11 DIAGNOSIS — R3911 Hesitancy of micturition: Secondary | ICD-10-CM | POA: Diagnosis not present

## 2020-02-11 DIAGNOSIS — R2689 Other abnormalities of gait and mobility: Secondary | ICD-10-CM | POA: Diagnosis not present

## 2020-02-11 DIAGNOSIS — M6281 Muscle weakness (generalized): Secondary | ICD-10-CM | POA: Diagnosis not present

## 2020-02-11 DIAGNOSIS — S72001S Fracture of unspecified part of neck of right femur, sequela: Secondary | ICD-10-CM | POA: Diagnosis not present

## 2020-02-11 DIAGNOSIS — M25551 Pain in right hip: Secondary | ICD-10-CM | POA: Diagnosis not present

## 2020-02-11 NOTE — Patient Outreach (Signed)
[facility] Post-Acute Care Coordinator follow up. Member screened for potential [facility] Care Management needs.  Confirmed with [facility] SW that member returned to [facility] with [facility].   No identifiable [facility] needs.    Raiford Noble, MSN, RN,BSN [facility] Post Acute Care Coordinator [phone] [facility]) [phone]  (Toll free office)

## 2020-02-14 ENCOUNTER — Other Ambulatory Visit: Payer: Medicare Other

## 2020-02-14 ENCOUNTER — Other Ambulatory Visit: Payer: Self-pay

## 2020-02-14 DIAGNOSIS — Z515 Encounter for palliative care: Secondary | ICD-10-CM

## 2020-02-14 DIAGNOSIS — R278 Other lack of coordination: Secondary | ICD-10-CM | POA: Diagnosis not present

## 2020-02-14 DIAGNOSIS — R2689 Other abnormalities of gait and mobility: Secondary | ICD-10-CM | POA: Diagnosis not present

## 2020-02-14 DIAGNOSIS — R2681 Unsteadiness on feet: Secondary | ICD-10-CM | POA: Diagnosis not present

## 2020-02-14 DIAGNOSIS — M6281 Muscle weakness (generalized): Secondary | ICD-10-CM | POA: Diagnosis not present

## 2020-02-14 DIAGNOSIS — M25551 Pain in right hip: Secondary | ICD-10-CM | POA: Diagnosis not present

## 2020-02-14 DIAGNOSIS — S72001S Fracture of unspecified part of neck of right femur, sequela: Secondary | ICD-10-CM | POA: Diagnosis not present

## 2020-02-14 NOTE — Progress Notes (Signed)
COMMUNITY PALLIATIVE CARE SW NOTE  PATIENT NAME: Lynesha Bango DOB: 08-01-1921 MRN: [id number]  PRIMARY CARE PROVIDER: [name], MD  RESPONSIBLE PARTY:  Acct ID - Guarantor Home Phone Work Phone Relationship Acct Type  [id number] - Ferger,[name] [phone]  Self P/F     [address], [place], [place] [address]     PLAN OF CARE and INTERVENTIONS:             1. GOALS OF CARE/ ADVANCE CARE PLANNING: Goals is for patient to remain in her independent apartment safely. Patient is a DNR 1.   2. SOCIAL/EMOTIONAL/SPIRITUAL ASSESSMENT/ INTERVENTIONS:  SW completed a face-to-face visit with patient at the facility. Patient was finishing up getting dress. Patient was sitting in her wheelchair and ambulated her self in it throughout the apartment. Patient explained that she had a fall broke her femur on 01/23/20. She had surgery and then rehab at [facility] for 15 days. Patient continues to receive physical and occupational therapy through [facility] at the facility. She is happy to be back home and anxious to be able to ambulate with her walker again. She has increased personal care services through [facility] where an aide will check on her 5x/day, get up and dressed in the mornings and assist her back to bed at night. She denied that she was having any pain but report periods of shortness of breath, which she displayed as she talked throughout the visit. Patient was alert and oriented x3. She shared her feelings about the fall and the rehab facility.Patient is happy to be back at the facility and feels fortunate to have the support of her family. SW pushed patient to the dinning room for lunch. SW will provide ongoing assessment of psychosocial needs and provide support.  2.  2. PATIENT/CAREGIVER EDUCATION/ COPING:  Patient is alert and oriented x3 with some forgetfulness and increased shortness of breath. Her daughters and extended family members are active in her care and supportive. Patient appears  to be coping well. 3.  4. PERSONAL EMERGENCY PLAN:  Per facility protocol.  5. COMMUNITY RESOURCES COORDINATION/ HEALTH CARE NAVIGATION:  Patient is receiving physical and occupational therapy through [facility]. She is also receiving personal care assistance through [facility] daily.  6. FINANCIAL/LEGAL CONCERNS/INTERVENTIONS:  None.      SOCIAL HX:  Social History   Tobacco Use  . Smoking status: Never Smoker  . Smokeless tobacco: Never Used  Substance Use Topics  . Alcohol use: Not Currently    CODE STATUS: DNR ADVANCED DIRECTIVES: No MOST FORM COMPLETE:  No HOSPICE EDUCATION PROVIDED: No  PPS: Patient is alert and oriented x3, but admits that she is forgetful. She is now ambulating in her wheelchair  in her apartment. She receives daily personal care and medication reminders. Patient is also receiving physical and occupational therapy through [facility].   Duration of visit and documentation:64minutes      [employer], LCSW

## 2020-02-15 DIAGNOSIS — R2681 Unsteadiness on feet: Secondary | ICD-10-CM | POA: Diagnosis not present

## 2020-02-15 DIAGNOSIS — M6281 Muscle weakness (generalized): Secondary | ICD-10-CM | POA: Diagnosis not present

## 2020-02-15 DIAGNOSIS — Z9181 History of falling: Secondary | ICD-10-CM | POA: Diagnosis not present

## 2020-02-15 DIAGNOSIS — R2689 Other abnormalities of gait and mobility: Secondary | ICD-10-CM | POA: Diagnosis not present

## 2020-02-15 DIAGNOSIS — R278 Other lack of coordination: Secondary | ICD-10-CM | POA: Diagnosis not present

## 2020-02-15 DIAGNOSIS — M25551 Pain in right hip: Secondary | ICD-10-CM | POA: Diagnosis not present

## 2020-02-15 DIAGNOSIS — S72001S Fracture of unspecified part of neck of right femur, sequela: Secondary | ICD-10-CM | POA: Diagnosis not present

## 2020-02-16 ENCOUNTER — Telehealth (INDEPENDENT_AMBULATORY_CARE_PROVIDER_SITE_OTHER): Payer: Medicare Other | Admitting: Emergency Medicine

## 2020-02-16 ENCOUNTER — Encounter: Payer: Self-pay | Admitting: Emergency Medicine

## 2020-02-16 DIAGNOSIS — R2681 Unsteadiness on feet: Secondary | ICD-10-CM | POA: Diagnosis not present

## 2020-02-16 DIAGNOSIS — R278 Other lack of coordination: Secondary | ICD-10-CM | POA: Diagnosis not present

## 2020-02-16 DIAGNOSIS — R2689 Other abnormalities of gait and mobility: Secondary | ICD-10-CM | POA: Diagnosis not present

## 2020-02-16 DIAGNOSIS — J479 Bronchiectasis, uncomplicated: Secondary | ICD-10-CM

## 2020-02-16 DIAGNOSIS — M25551 Pain in right hip: Secondary | ICD-10-CM | POA: Diagnosis not present

## 2020-02-16 DIAGNOSIS — S72001S Fracture of unspecified part of neck of right femur, sequela: Secondary | ICD-10-CM | POA: Diagnosis not present

## 2020-02-16 DIAGNOSIS — M6281 Muscle weakness (generalized): Secondary | ICD-10-CM | POA: Diagnosis not present

## 2020-02-16 NOTE — Progress Notes (Signed)
Virtual Visit via Video Note  I connected with Sarah [name] on 02/16/20 at  3:15 PM EST by a video enabled telemedicine application and verified that I am speaking with the correct person using two identifiers.  Location: Patient: Home Provider: Office   I discussed the limitations of evaluation and management by telemedicine and the availability of in person appointments. The patient expressed understanding and agreed to proceed.  History of Present Illness: Sarah [name] is 69, followed in our office for bronchiectasis with associated chronic asthmatic bronchitis and upper airway irritation syndrome.  She is colonized with Pseudomonas.  We have maintained her on chronic prednisone 10 mg daily, rotating antibiotics-azithromycin, Omnicef.  She uses scheduled albuterol nebs to help with secretion clearance as well as a chest vest.  She also has GERD and has been on Protonix  Unfortunately she sustained a fall in early January that resulted in hip fracture that required urgent hip surgery.  She was discharged to [facility], now she is home and receiving PT.   Observations/Objective: She tolerated the hip surgery fairly well, is not having any pain right now. She is participating in PT. At the time of her sgy she had just finished omnicef, and then she was started on another course at [facility]. She is breathing well. Her mucous had resolved for several weeks. She has a bit of cough now, starting to be prod of brownish mucous again. She would be due to start azithro in [facility] Feb. She remains on pred 10mg . She is doing nebs bid. She is not doing chest vest right now.   Pt's daughter is present and helps with the history.    Assessment and Plan: - restart chest vest daily - albuterol nebs bid - azithro, omnicef every other month.  - continue pred 10mg  daily.  - OV in 2 months.   Follow Up Instructions: 2 months or prn.    I discussed the assessment and treatment plan with the  patient. The patient was provided an opportunity to ask questions and all were answered. The patient agreed with the plan and demonstrated an understanding of the instructions.   The patient was advised to call back or seek an in-person evaluation if the symptoms worsen or if the condition fails to improve as anticipated.  I provided 25 minutes of non-face-to-face time during this encounter.   Sharyl Nimrod, MD, PhD 02/16/2020, 4:04 PM [facility] [phone] or if no answer [phone]

## 2020-02-17 DIAGNOSIS — R278 Other lack of coordination: Secondary | ICD-10-CM | POA: Diagnosis not present

## 2020-02-17 DIAGNOSIS — S72001S Fracture of unspecified part of neck of right femur, sequela: Secondary | ICD-10-CM | POA: Diagnosis not present

## 2020-02-17 DIAGNOSIS — R2681 Unsteadiness on feet: Secondary | ICD-10-CM | POA: Diagnosis not present

## 2020-02-17 DIAGNOSIS — R2689 Other abnormalities of gait and mobility: Secondary | ICD-10-CM | POA: Diagnosis not present

## 2020-02-17 DIAGNOSIS — M6281 Muscle weakness (generalized): Secondary | ICD-10-CM | POA: Diagnosis not present

## 2020-02-17 DIAGNOSIS — M25551 Pain in right hip: Secondary | ICD-10-CM | POA: Diagnosis not present

## 2020-02-18 DIAGNOSIS — S72001S Fracture of unspecified part of neck of right femur, sequela: Secondary | ICD-10-CM | POA: Diagnosis not present

## 2020-02-18 DIAGNOSIS — R2681 Unsteadiness on feet: Secondary | ICD-10-CM | POA: Diagnosis not present

## 2020-02-18 DIAGNOSIS — M6281 Muscle weakness (generalized): Secondary | ICD-10-CM | POA: Diagnosis not present

## 2020-02-18 DIAGNOSIS — R2689 Other abnormalities of gait and mobility: Secondary | ICD-10-CM | POA: Diagnosis not present

## 2020-02-18 DIAGNOSIS — R278 Other lack of coordination: Secondary | ICD-10-CM | POA: Diagnosis not present

## 2020-02-18 DIAGNOSIS — M25551 Pain in right hip: Secondary | ICD-10-CM | POA: Diagnosis not present

## 2020-02-21 DIAGNOSIS — S72001S Fracture of unspecified part of neck of right femur, sequela: Secondary | ICD-10-CM | POA: Diagnosis not present

## 2020-02-21 DIAGNOSIS — Z1159 Encounter for screening for other viral diseases: Secondary | ICD-10-CM | POA: Diagnosis not present

## 2020-02-21 DIAGNOSIS — M25551 Pain in right hip: Secondary | ICD-10-CM | POA: Diagnosis not present

## 2020-02-21 DIAGNOSIS — R278 Other lack of coordination: Secondary | ICD-10-CM | POA: Diagnosis not present

## 2020-02-21 DIAGNOSIS — R2681 Unsteadiness on feet: Secondary | ICD-10-CM | POA: Diagnosis not present

## 2020-02-21 DIAGNOSIS — Z20828 Contact with and (suspected) exposure to other viral communicable diseases: Secondary | ICD-10-CM | POA: Diagnosis not present

## 2020-02-21 DIAGNOSIS — M6281 Muscle weakness (generalized): Secondary | ICD-10-CM | POA: Diagnosis not present

## 2020-02-21 DIAGNOSIS — R2689 Other abnormalities of gait and mobility: Secondary | ICD-10-CM | POA: Diagnosis not present

## 2020-02-22 NOTE — Telephone Encounter (Signed)
I think she should go ahead and take the azithro on 2/14 as planned.

## 2020-02-22 NOTE — Telephone Encounter (Signed)
Please advise on patient mychart message     Mom has UTI, now taking Nitrofurantoin Macro 100 mg BID x 5 days, finishing on 2/13.  Her normal Azithromycin is currently scheduled to begin 2/14. Should she hold off until next month for that, or continue regular schedule on 2/14?  Please advise.  Thanks, [employer]

## 2020-02-23 DIAGNOSIS — R278 Other lack of coordination: Secondary | ICD-10-CM | POA: Diagnosis not present

## 2020-02-23 DIAGNOSIS — R2681 Unsteadiness on feet: Secondary | ICD-10-CM | POA: Diagnosis not present

## 2020-02-23 DIAGNOSIS — R2689 Other abnormalities of gait and mobility: Secondary | ICD-10-CM | POA: Diagnosis not present

## 2020-02-23 DIAGNOSIS — M25551 Pain in right hip: Secondary | ICD-10-CM | POA: Diagnosis not present

## 2020-02-23 DIAGNOSIS — S72001S Fracture of unspecified part of neck of right femur, sequela: Secondary | ICD-10-CM | POA: Diagnosis not present

## 2020-02-23 DIAGNOSIS — M6281 Muscle weakness (generalized): Secondary | ICD-10-CM | POA: Diagnosis not present

## 2020-02-24 DIAGNOSIS — R2681 Unsteadiness on feet: Secondary | ICD-10-CM | POA: Diagnosis not present

## 2020-02-24 DIAGNOSIS — M25551 Pain in right hip: Secondary | ICD-10-CM | POA: Diagnosis not present

## 2020-02-24 DIAGNOSIS — S72001S Fracture of unspecified part of neck of right femur, sequela: Secondary | ICD-10-CM | POA: Diagnosis not present

## 2020-02-24 DIAGNOSIS — R278 Other lack of coordination: Secondary | ICD-10-CM | POA: Diagnosis not present

## 2020-02-24 DIAGNOSIS — R2689 Other abnormalities of gait and mobility: Secondary | ICD-10-CM | POA: Diagnosis not present

## 2020-02-24 DIAGNOSIS — M6281 Muscle weakness (generalized): Secondary | ICD-10-CM | POA: Diagnosis not present

## 2020-02-25 DIAGNOSIS — R278 Other lack of coordination: Secondary | ICD-10-CM | POA: Diagnosis not present

## 2020-02-25 DIAGNOSIS — R2689 Other abnormalities of gait and mobility: Secondary | ICD-10-CM | POA: Diagnosis not present

## 2020-02-25 DIAGNOSIS — M25551 Pain in right hip: Secondary | ICD-10-CM | POA: Diagnosis not present

## 2020-02-25 DIAGNOSIS — S72001S Fracture of unspecified part of neck of right femur, sequela: Secondary | ICD-10-CM | POA: Diagnosis not present

## 2020-02-25 DIAGNOSIS — M6281 Muscle weakness (generalized): Secondary | ICD-10-CM | POA: Diagnosis not present

## 2020-02-25 DIAGNOSIS — R2681 Unsteadiness on feet: Secondary | ICD-10-CM | POA: Diagnosis not present

## 2020-02-28 DIAGNOSIS — M25551 Pain in right hip: Secondary | ICD-10-CM | POA: Diagnosis not present

## 2020-02-28 DIAGNOSIS — Z1159 Encounter for screening for other viral diseases: Secondary | ICD-10-CM | POA: Diagnosis not present

## 2020-02-28 DIAGNOSIS — S72001S Fracture of unspecified part of neck of right femur, sequela: Secondary | ICD-10-CM | POA: Diagnosis not present

## 2020-02-28 DIAGNOSIS — M6281 Muscle weakness (generalized): Secondary | ICD-10-CM | POA: Diagnosis not present

## 2020-02-28 DIAGNOSIS — R278 Other lack of coordination: Secondary | ICD-10-CM | POA: Diagnosis not present

## 2020-02-28 DIAGNOSIS — R2689 Other abnormalities of gait and mobility: Secondary | ICD-10-CM | POA: Diagnosis not present

## 2020-02-28 DIAGNOSIS — Z20828 Contact with and (suspected) exposure to other viral communicable diseases: Secondary | ICD-10-CM | POA: Diagnosis not present

## 2020-02-28 DIAGNOSIS — R2681 Unsteadiness on feet: Secondary | ICD-10-CM | POA: Diagnosis not present

## 2020-02-29 DIAGNOSIS — R2681 Unsteadiness on feet: Secondary | ICD-10-CM | POA: Diagnosis not present

## 2020-02-29 DIAGNOSIS — R278 Other lack of coordination: Secondary | ICD-10-CM | POA: Diagnosis not present

## 2020-02-29 DIAGNOSIS — M6281 Muscle weakness (generalized): Secondary | ICD-10-CM | POA: Diagnosis not present

## 2020-02-29 DIAGNOSIS — S72001S Fracture of unspecified part of neck of right femur, sequela: Secondary | ICD-10-CM | POA: Diagnosis not present

## 2020-02-29 DIAGNOSIS — R2689 Other abnormalities of gait and mobility: Secondary | ICD-10-CM | POA: Diagnosis not present

## 2020-02-29 DIAGNOSIS — M25551 Pain in right hip: Secondary | ICD-10-CM | POA: Diagnosis not present

## 2020-03-01 DIAGNOSIS — R2681 Unsteadiness on feet: Secondary | ICD-10-CM | POA: Diagnosis not present

## 2020-03-01 DIAGNOSIS — R278 Other lack of coordination: Secondary | ICD-10-CM | POA: Diagnosis not present

## 2020-03-01 DIAGNOSIS — S72001S Fracture of unspecified part of neck of right femur, sequela: Secondary | ICD-10-CM | POA: Diagnosis not present

## 2020-03-01 DIAGNOSIS — M25551 Pain in right hip: Secondary | ICD-10-CM | POA: Diagnosis not present

## 2020-03-01 DIAGNOSIS — R2689 Other abnormalities of gait and mobility: Secondary | ICD-10-CM | POA: Diagnosis not present

## 2020-03-01 DIAGNOSIS — M6281 Muscle weakness (generalized): Secondary | ICD-10-CM | POA: Diagnosis not present

## 2020-03-02 DIAGNOSIS — S72001S Fracture of unspecified part of neck of right femur, sequela: Secondary | ICD-10-CM | POA: Diagnosis not present

## 2020-03-02 DIAGNOSIS — R2689 Other abnormalities of gait and mobility: Secondary | ICD-10-CM | POA: Diagnosis not present

## 2020-03-02 DIAGNOSIS — M25551 Pain in right hip: Secondary | ICD-10-CM | POA: Diagnosis not present

## 2020-03-02 DIAGNOSIS — R2681 Unsteadiness on feet: Secondary | ICD-10-CM | POA: Diagnosis not present

## 2020-03-02 DIAGNOSIS — R278 Other lack of coordination: Secondary | ICD-10-CM | POA: Diagnosis not present

## 2020-03-02 DIAGNOSIS — M6281 Muscle weakness (generalized): Secondary | ICD-10-CM | POA: Diagnosis not present

## 2020-03-03 DIAGNOSIS — M6281 Muscle weakness (generalized): Secondary | ICD-10-CM | POA: Diagnosis not present

## 2020-03-03 DIAGNOSIS — R2689 Other abnormalities of gait and mobility: Secondary | ICD-10-CM | POA: Diagnosis not present

## 2020-03-03 DIAGNOSIS — R278 Other lack of coordination: Secondary | ICD-10-CM | POA: Diagnosis not present

## 2020-03-03 DIAGNOSIS — R2681 Unsteadiness on feet: Secondary | ICD-10-CM | POA: Diagnosis not present

## 2020-03-03 DIAGNOSIS — M25551 Pain in right hip: Secondary | ICD-10-CM | POA: Diagnosis not present

## 2020-03-03 DIAGNOSIS — S72001S Fracture of unspecified part of neck of right femur, sequela: Secondary | ICD-10-CM | POA: Diagnosis not present

## 2020-03-06 DIAGNOSIS — S72032D Displaced midcervical fracture of left femur, subsequent encounter for closed fracture with routine healing: Secondary | ICD-10-CM | POA: Diagnosis not present

## 2020-03-06 DIAGNOSIS — S72031D Displaced midcervical fracture of right femur, subsequent encounter for closed fracture with routine healing: Secondary | ICD-10-CM | POA: Diagnosis not present

## 2020-03-07 ENCOUNTER — Other Ambulatory Visit: Payer: Medicare Other

## 2020-03-07 ENCOUNTER — Other Ambulatory Visit: Payer: Self-pay

## 2020-03-07 DIAGNOSIS — R278 Other lack of coordination: Secondary | ICD-10-CM | POA: Diagnosis not present

## 2020-03-07 DIAGNOSIS — S72001S Fracture of unspecified part of neck of right femur, sequela: Secondary | ICD-10-CM | POA: Diagnosis not present

## 2020-03-07 DIAGNOSIS — R2681 Unsteadiness on feet: Secondary | ICD-10-CM | POA: Diagnosis not present

## 2020-03-07 DIAGNOSIS — Z515 Encounter for palliative care: Secondary | ICD-10-CM

## 2020-03-07 DIAGNOSIS — M25551 Pain in right hip: Secondary | ICD-10-CM | POA: Diagnosis not present

## 2020-03-07 DIAGNOSIS — M6281 Muscle weakness (generalized): Secondary | ICD-10-CM | POA: Diagnosis not present

## 2020-03-07 DIAGNOSIS — R2689 Other abnormalities of gait and mobility: Secondary | ICD-10-CM | POA: Diagnosis not present

## 2020-03-08 DIAGNOSIS — S72001S Fracture of unspecified part of neck of right femur, sequela: Secondary | ICD-10-CM | POA: Diagnosis not present

## 2020-03-08 DIAGNOSIS — R2681 Unsteadiness on feet: Secondary | ICD-10-CM | POA: Diagnosis not present

## 2020-03-08 DIAGNOSIS — M6281 Muscle weakness (generalized): Secondary | ICD-10-CM | POA: Diagnosis not present

## 2020-03-08 DIAGNOSIS — R278 Other lack of coordination: Secondary | ICD-10-CM | POA: Diagnosis not present

## 2020-03-08 DIAGNOSIS — M25551 Pain in right hip: Secondary | ICD-10-CM | POA: Diagnosis not present

## 2020-03-08 DIAGNOSIS — R2689 Other abnormalities of gait and mobility: Secondary | ICD-10-CM | POA: Diagnosis not present

## 2020-03-09 DIAGNOSIS — M25551 Pain in right hip: Secondary | ICD-10-CM | POA: Diagnosis not present

## 2020-03-09 DIAGNOSIS — R2681 Unsteadiness on feet: Secondary | ICD-10-CM | POA: Diagnosis not present

## 2020-03-09 DIAGNOSIS — R278 Other lack of coordination: Secondary | ICD-10-CM | POA: Diagnosis not present

## 2020-03-09 DIAGNOSIS — M6281 Muscle weakness (generalized): Secondary | ICD-10-CM | POA: Diagnosis not present

## 2020-03-09 DIAGNOSIS — S72001S Fracture of unspecified part of neck of right femur, sequela: Secondary | ICD-10-CM | POA: Diagnosis not present

## 2020-03-09 DIAGNOSIS — R2689 Other abnormalities of gait and mobility: Secondary | ICD-10-CM | POA: Diagnosis not present

## 2020-03-10 DIAGNOSIS — S72001S Fracture of unspecified part of neck of right femur, sequela: Secondary | ICD-10-CM | POA: Diagnosis not present

## 2020-03-10 DIAGNOSIS — M25551 Pain in right hip: Secondary | ICD-10-CM | POA: Diagnosis not present

## 2020-03-10 DIAGNOSIS — R2689 Other abnormalities of gait and mobility: Secondary | ICD-10-CM | POA: Diagnosis not present

## 2020-03-10 DIAGNOSIS — R2681 Unsteadiness on feet: Secondary | ICD-10-CM | POA: Diagnosis not present

## 2020-03-10 DIAGNOSIS — M6281 Muscle weakness (generalized): Secondary | ICD-10-CM | POA: Diagnosis not present

## 2020-03-10 DIAGNOSIS — R278 Other lack of coordination: Secondary | ICD-10-CM | POA: Diagnosis not present

## 2020-03-10 NOTE — Progress Notes (Signed)
COMMUNITY PALLIATIVE CARE SW NOTE  PATIENT NAME: Sarah [name] DOB: 11-06-1921 MRN: [id number]  PRIMARY CARE PROVIDER: [name], MD  RESPONSIBLE PARTY:  Acct ID - Guarantor Home Phone Work Phone Relationship Acct Type  [id number] - [name],[name] [phone]  Self P/F     [address], [place], [place] [address]     PLAN OF CARE and INTERVENTIONS:             1. GOALS OF CARE/ ADVANCE CARE PLANNING:  Goals is for patient to remain in her independent apartment safely. Patient is a DNR 1.  2. SOCIAL/EMOTIONAL/SPIRITUAL ASSESSMENT/ INTERVENTIONS:  SW completed a face-to-face visit with patient at her apartment. Patient was sitting on her couch. She stated that she just returned from physical therapy. Patient had a productive cough where yellow flym came up. She was also short of breath and had difficulty talking. Patient repeated herself. She stated that she was tired and would lay her head back with a sigh. Patient also shared that her legs have been weeping. They were currently wrapped. She showed SW loose skin that were fluid pockets on her leg. SW encouraged her to keep her legs elevated and she advised that she has been keeping them elevated. She engaged in life review as she reminisced on how she has overcome so much and how supportive her family has been. SW will provide ongoing assessment of psychosocial needs and provide support.  2. PATIENT/CAREGIVER EDUCATION/ COPING:  Patient is alert and oriented x3 with some forgetfulness and increased shortness of breath. Her daughters and extended family members are active in her care and supportive. Patient appears to be coping well. 2.  3. PERSONAL EMERGENCY PLAN:  Per facility protocol.  3. COMMUNITY RESOURCES COORDINATION/ HEALTH CARE NAVIGATION:  Patient is receiving physical and occupational therapy through [facility]. She is also receiving personal care assistance through [facility] daily 4.  5. FINANCIAL/LEGAL CONCERNS/INTERVENTIONS:   None.     SOCIAL HX:  Social History   Tobacco Use  . Smoking status: Never Smoker  . Smokeless tobacco: Never Used  Substance Use Topics  . Alcohol use: Not Currently    CODE STATUS: DNR ADVANCED DIRECTIVES: No MOST FORM COMPLETE: No HOSPICE EDUCATION PROVIDED: No  PPS: Patient is alert and oriented x3, but admits that she is forgetful. She is now ambulating in her wheelchair  in her apartment. She receives daily personal care and medication reminders. Patient is also receiving physical and occupational therapy through [facility].   Duration of visit and documentation:27minutes      [employer], LCSW

## 2020-03-13 DIAGNOSIS — Z1159 Encounter for screening for other viral diseases: Secondary | ICD-10-CM | POA: Diagnosis not present

## 2020-03-13 DIAGNOSIS — Z20828 Contact with and (suspected) exposure to other viral communicable diseases: Secondary | ICD-10-CM | POA: Diagnosis not present

## 2020-03-14 DIAGNOSIS — R2681 Unsteadiness on feet: Secondary | ICD-10-CM | POA: Diagnosis not present

## 2020-03-14 DIAGNOSIS — R2689 Other abnormalities of gait and mobility: Secondary | ICD-10-CM | POA: Diagnosis not present

## 2020-03-14 DIAGNOSIS — M6281 Muscle weakness (generalized): Secondary | ICD-10-CM | POA: Diagnosis not present

## 2020-03-14 DIAGNOSIS — Z9181 History of falling: Secondary | ICD-10-CM | POA: Diagnosis not present

## 2020-03-14 DIAGNOSIS — S72001S Fracture of unspecified part of neck of right femur, sequela: Secondary | ICD-10-CM | POA: Diagnosis not present

## 2020-03-14 DIAGNOSIS — M25551 Pain in right hip: Secondary | ICD-10-CM | POA: Diagnosis not present

## 2020-03-14 DIAGNOSIS — R278 Other lack of coordination: Secondary | ICD-10-CM | POA: Diagnosis not present

## 2020-03-15 DIAGNOSIS — R278 Other lack of coordination: Secondary | ICD-10-CM | POA: Diagnosis not present

## 2020-03-15 DIAGNOSIS — R2689 Other abnormalities of gait and mobility: Secondary | ICD-10-CM | POA: Diagnosis not present

## 2020-03-15 DIAGNOSIS — R2681 Unsteadiness on feet: Secondary | ICD-10-CM | POA: Diagnosis not present

## 2020-03-15 DIAGNOSIS — M6281 Muscle weakness (generalized): Secondary | ICD-10-CM | POA: Diagnosis not present

## 2020-03-15 DIAGNOSIS — M25551 Pain in right hip: Secondary | ICD-10-CM | POA: Diagnosis not present

## 2020-03-15 DIAGNOSIS — S72001S Fracture of unspecified part of neck of right femur, sequela: Secondary | ICD-10-CM | POA: Diagnosis not present

## 2020-03-16 DIAGNOSIS — R2689 Other abnormalities of gait and mobility: Secondary | ICD-10-CM | POA: Diagnosis not present

## 2020-03-16 DIAGNOSIS — M25551 Pain in right hip: Secondary | ICD-10-CM | POA: Diagnosis not present

## 2020-03-16 DIAGNOSIS — R278 Other lack of coordination: Secondary | ICD-10-CM | POA: Diagnosis not present

## 2020-03-16 DIAGNOSIS — S72001S Fracture of unspecified part of neck of right femur, sequela: Secondary | ICD-10-CM | POA: Diagnosis not present

## 2020-03-16 DIAGNOSIS — M6281 Muscle weakness (generalized): Secondary | ICD-10-CM | POA: Diagnosis not present

## 2020-03-16 DIAGNOSIS — R2681 Unsteadiness on feet: Secondary | ICD-10-CM | POA: Diagnosis not present

## 2020-03-20 DIAGNOSIS — Z1159 Encounter for screening for other viral diseases: Secondary | ICD-10-CM | POA: Diagnosis not present

## 2020-03-20 DIAGNOSIS — Z20828 Contact with and (suspected) exposure to other viral communicable diseases: Secondary | ICD-10-CM | POA: Diagnosis not present

## 2020-03-23 DIAGNOSIS — B965 Pseudomonas (aeruginosa) (mallei) (pseudomallei) as the cause of diseases classified elsewhere: Secondary | ICD-10-CM | POA: Diagnosis not present

## 2020-03-23 DIAGNOSIS — M1990 Unspecified osteoarthritis, unspecified site: Secondary | ICD-10-CM | POA: Diagnosis not present

## 2020-03-23 DIAGNOSIS — I872 Venous insufficiency (chronic) (peripheral): Secondary | ICD-10-CM | POA: Diagnosis not present

## 2020-03-23 DIAGNOSIS — G609 Hereditary and idiopathic neuropathy, unspecified: Secondary | ICD-10-CM | POA: Diagnosis not present

## 2020-03-23 DIAGNOSIS — K449 Diaphragmatic hernia without obstruction or gangrene: Secondary | ICD-10-CM | POA: Diagnosis not present

## 2020-03-23 DIAGNOSIS — E039 Hypothyroidism, unspecified: Secondary | ICD-10-CM | POA: Diagnosis not present

## 2020-03-23 DIAGNOSIS — I2510 Atherosclerotic heart disease of native coronary artery without angina pectoris: Secondary | ICD-10-CM | POA: Diagnosis not present

## 2020-03-23 DIAGNOSIS — L97821 Non-pressure chronic ulcer of other part of left lower leg limited to breakdown of skin: Secondary | ICD-10-CM | POA: Diagnosis not present

## 2020-03-23 DIAGNOSIS — N183 Chronic kidney disease, stage 3 unspecified: Secondary | ICD-10-CM | POA: Diagnosis not present

## 2020-03-23 DIAGNOSIS — I130 Hypertensive heart and chronic kidney disease with heart failure and stage 1 through stage 4 chronic kidney disease, or unspecified chronic kidney disease: Secondary | ICD-10-CM | POA: Diagnosis not present

## 2020-03-23 DIAGNOSIS — K219 Gastro-esophageal reflux disease without esophagitis: Secondary | ICD-10-CM | POA: Diagnosis not present

## 2020-03-23 DIAGNOSIS — Z8673 Personal history of transient ischemic attack (TIA), and cerebral infarction without residual deficits: Secondary | ICD-10-CM | POA: Diagnosis not present

## 2020-03-23 DIAGNOSIS — H9193 Unspecified hearing loss, bilateral: Secondary | ICD-10-CM | POA: Diagnosis not present

## 2020-03-23 DIAGNOSIS — E559 Vitamin D deficiency, unspecified: Secondary | ICD-10-CM | POA: Diagnosis not present

## 2020-03-23 DIAGNOSIS — I5032 Chronic diastolic (congestive) heart failure: Secondary | ICD-10-CM | POA: Diagnosis not present

## 2020-03-23 DIAGNOSIS — F32A Depression, unspecified: Secondary | ICD-10-CM | POA: Diagnosis not present

## 2020-03-23 DIAGNOSIS — J9601 Acute respiratory failure with hypoxia: Secondary | ICD-10-CM | POA: Diagnosis not present

## 2020-03-23 DIAGNOSIS — J479 Bronchiectasis, uncomplicated: Secondary | ICD-10-CM | POA: Diagnosis not present

## 2020-03-23 DIAGNOSIS — F411 Generalized anxiety disorder: Secondary | ICD-10-CM | POA: Diagnosis not present

## 2020-03-23 DIAGNOSIS — D509 Iron deficiency anemia, unspecified: Secondary | ICD-10-CM | POA: Diagnosis not present

## 2020-03-23 DIAGNOSIS — Z7902 Long term (current) use of antithrombotics/antiplatelets: Secondary | ICD-10-CM | POA: Diagnosis not present

## 2020-03-23 DIAGNOSIS — L97221 Non-pressure chronic ulcer of left calf limited to breakdown of skin: Secondary | ICD-10-CM | POA: Diagnosis not present

## 2020-03-23 DIAGNOSIS — E785 Hyperlipidemia, unspecified: Secondary | ICD-10-CM | POA: Diagnosis not present

## 2020-03-23 DIAGNOSIS — Z7952 Long term (current) use of systemic steroids: Secondary | ICD-10-CM | POA: Diagnosis not present

## 2020-03-23 DIAGNOSIS — S72001D Fracture of unspecified part of neck of right femur, subsequent encounter for closed fracture with routine healing: Secondary | ICD-10-CM | POA: Diagnosis not present

## 2020-03-28 DIAGNOSIS — I872 Venous insufficiency (chronic) (peripheral): Secondary | ICD-10-CM | POA: Diagnosis not present

## 2020-03-28 DIAGNOSIS — I130 Hypertensive heart and chronic kidney disease with heart failure and stage 1 through stage 4 chronic kidney disease, or unspecified chronic kidney disease: Secondary | ICD-10-CM | POA: Diagnosis not present

## 2020-03-28 DIAGNOSIS — L97221 Non-pressure chronic ulcer of left calf limited to breakdown of skin: Secondary | ICD-10-CM | POA: Diagnosis not present

## 2020-03-28 DIAGNOSIS — L97821 Non-pressure chronic ulcer of other part of left lower leg limited to breakdown of skin: Secondary | ICD-10-CM | POA: Diagnosis not present

## 2020-03-28 DIAGNOSIS — S72001D Fracture of unspecified part of neck of right femur, subsequent encounter for closed fracture with routine healing: Secondary | ICD-10-CM | POA: Diagnosis not present

## 2020-03-28 DIAGNOSIS — J9601 Acute respiratory failure with hypoxia: Secondary | ICD-10-CM | POA: Diagnosis not present

## 2020-03-29 DIAGNOSIS — I130 Hypertensive heart and chronic kidney disease with heart failure and stage 1 through stage 4 chronic kidney disease, or unspecified chronic kidney disease: Secondary | ICD-10-CM | POA: Diagnosis not present

## 2020-03-29 DIAGNOSIS — H401131 Primary open-angle glaucoma, bilateral, mild stage: Secondary | ICD-10-CM | POA: Diagnosis not present

## 2020-03-29 DIAGNOSIS — J9601 Acute respiratory failure with hypoxia: Secondary | ICD-10-CM | POA: Diagnosis not present

## 2020-03-29 DIAGNOSIS — S72001D Fracture of unspecified part of neck of right femur, subsequent encounter for closed fracture with routine healing: Secondary | ICD-10-CM | POA: Diagnosis not present

## 2020-03-29 DIAGNOSIS — L97221 Non-pressure chronic ulcer of left calf limited to breakdown of skin: Secondary | ICD-10-CM | POA: Diagnosis not present

## 2020-03-29 DIAGNOSIS — I872 Venous insufficiency (chronic) (peripheral): Secondary | ICD-10-CM | POA: Diagnosis not present

## 2020-03-29 DIAGNOSIS — L97821 Non-pressure chronic ulcer of other part of left lower leg limited to breakdown of skin: Secondary | ICD-10-CM | POA: Diagnosis not present

## 2020-03-30 DIAGNOSIS — L97221 Non-pressure chronic ulcer of left calf limited to breakdown of skin: Secondary | ICD-10-CM | POA: Diagnosis not present

## 2020-03-30 DIAGNOSIS — S72001D Fracture of unspecified part of neck of right femur, subsequent encounter for closed fracture with routine healing: Secondary | ICD-10-CM | POA: Diagnosis not present

## 2020-03-30 DIAGNOSIS — J9601 Acute respiratory failure with hypoxia: Secondary | ICD-10-CM | POA: Diagnosis not present

## 2020-03-30 DIAGNOSIS — I130 Hypertensive heart and chronic kidney disease with heart failure and stage 1 through stage 4 chronic kidney disease, or unspecified chronic kidney disease: Secondary | ICD-10-CM | POA: Diagnosis not present

## 2020-03-30 DIAGNOSIS — I872 Venous insufficiency (chronic) (peripheral): Secondary | ICD-10-CM | POA: Diagnosis not present

## 2020-03-30 DIAGNOSIS — L97821 Non-pressure chronic ulcer of other part of left lower leg limited to breakdown of skin: Secondary | ICD-10-CM | POA: Diagnosis not present

## 2020-04-03 DIAGNOSIS — Z20828 Contact with and (suspected) exposure to other viral communicable diseases: Secondary | ICD-10-CM | POA: Diagnosis not present

## 2020-04-03 DIAGNOSIS — L97221 Non-pressure chronic ulcer of left calf limited to breakdown of skin: Secondary | ICD-10-CM | POA: Diagnosis not present

## 2020-04-03 DIAGNOSIS — S72001D Fracture of unspecified part of neck of right femur, subsequent encounter for closed fracture with routine healing: Secondary | ICD-10-CM | POA: Diagnosis not present

## 2020-04-03 DIAGNOSIS — J9601 Acute respiratory failure with hypoxia: Secondary | ICD-10-CM | POA: Diagnosis not present

## 2020-04-03 DIAGNOSIS — I872 Venous insufficiency (chronic) (peripheral): Secondary | ICD-10-CM | POA: Diagnosis not present

## 2020-04-03 DIAGNOSIS — L97821 Non-pressure chronic ulcer of other part of left lower leg limited to breakdown of skin: Secondary | ICD-10-CM | POA: Diagnosis not present

## 2020-04-03 DIAGNOSIS — I130 Hypertensive heart and chronic kidney disease with heart failure and stage 1 through stage 4 chronic kidney disease, or unspecified chronic kidney disease: Secondary | ICD-10-CM | POA: Diagnosis not present

## 2020-04-03 DIAGNOSIS — Z1159 Encounter for screening for other viral diseases: Secondary | ICD-10-CM | POA: Diagnosis not present

## 2020-04-04 DIAGNOSIS — J9601 Acute respiratory failure with hypoxia: Secondary | ICD-10-CM | POA: Diagnosis not present

## 2020-04-04 DIAGNOSIS — S72001D Fracture of unspecified part of neck of right femur, subsequent encounter for closed fracture with routine healing: Secondary | ICD-10-CM | POA: Diagnosis not present

## 2020-04-04 DIAGNOSIS — L97821 Non-pressure chronic ulcer of other part of left lower leg limited to breakdown of skin: Secondary | ICD-10-CM | POA: Diagnosis not present

## 2020-04-04 DIAGNOSIS — I130 Hypertensive heart and chronic kidney disease with heart failure and stage 1 through stage 4 chronic kidney disease, or unspecified chronic kidney disease: Secondary | ICD-10-CM | POA: Diagnosis not present

## 2020-04-04 DIAGNOSIS — I872 Venous insufficiency (chronic) (peripheral): Secondary | ICD-10-CM | POA: Diagnosis not present

## 2020-04-04 DIAGNOSIS — L97221 Non-pressure chronic ulcer of left calf limited to breakdown of skin: Secondary | ICD-10-CM | POA: Diagnosis not present

## 2020-04-07 DIAGNOSIS — L97221 Non-pressure chronic ulcer of left calf limited to breakdown of skin: Secondary | ICD-10-CM | POA: Diagnosis not present

## 2020-04-07 DIAGNOSIS — S72001D Fracture of unspecified part of neck of right femur, subsequent encounter for closed fracture with routine healing: Secondary | ICD-10-CM | POA: Diagnosis not present

## 2020-04-07 DIAGNOSIS — L97821 Non-pressure chronic ulcer of other part of left lower leg limited to breakdown of skin: Secondary | ICD-10-CM | POA: Diagnosis not present

## 2020-04-07 DIAGNOSIS — I872 Venous insufficiency (chronic) (peripheral): Secondary | ICD-10-CM | POA: Diagnosis not present

## 2020-04-07 DIAGNOSIS — J9601 Acute respiratory failure with hypoxia: Secondary | ICD-10-CM | POA: Diagnosis not present

## 2020-04-07 DIAGNOSIS — I130 Hypertensive heart and chronic kidney disease with heart failure and stage 1 through stage 4 chronic kidney disease, or unspecified chronic kidney disease: Secondary | ICD-10-CM | POA: Diagnosis not present

## 2020-04-10 DIAGNOSIS — Z20828 Contact with and (suspected) exposure to other viral communicable diseases: Secondary | ICD-10-CM | POA: Diagnosis not present

## 2020-04-10 DIAGNOSIS — Z1159 Encounter for screening for other viral diseases: Secondary | ICD-10-CM | POA: Diagnosis not present

## 2020-04-11 ENCOUNTER — Other Ambulatory Visit: Payer: Medicare Other

## 2020-04-11 ENCOUNTER — Ambulatory Visit (INDEPENDENT_AMBULATORY_CARE_PROVIDER_SITE_OTHER): Payer: Medicare Other | Admitting: Podiatry

## 2020-04-11 ENCOUNTER — Encounter: Payer: Self-pay | Admitting: Podiatry

## 2020-04-11 ENCOUNTER — Other Ambulatory Visit: Payer: Self-pay

## 2020-04-11 DIAGNOSIS — L97821 Non-pressure chronic ulcer of other part of left lower leg limited to breakdown of skin: Secondary | ICD-10-CM | POA: Diagnosis not present

## 2020-04-11 DIAGNOSIS — M79674 Pain in right toe(s): Secondary | ICD-10-CM | POA: Diagnosis not present

## 2020-04-11 DIAGNOSIS — I130 Hypertensive heart and chronic kidney disease with heart failure and stage 1 through stage 4 chronic kidney disease, or unspecified chronic kidney disease: Secondary | ICD-10-CM | POA: Diagnosis not present

## 2020-04-11 DIAGNOSIS — L84 Corns and callosities: Secondary | ICD-10-CM

## 2020-04-11 DIAGNOSIS — J9601 Acute respiratory failure with hypoxia: Secondary | ICD-10-CM | POA: Diagnosis not present

## 2020-04-11 DIAGNOSIS — I872 Venous insufficiency (chronic) (peripheral): Secondary | ICD-10-CM | POA: Diagnosis not present

## 2020-04-11 DIAGNOSIS — M79675 Pain in left toe(s): Secondary | ICD-10-CM | POA: Diagnosis not present

## 2020-04-11 DIAGNOSIS — I739 Peripheral vascular disease, unspecified: Secondary | ICD-10-CM | POA: Diagnosis not present

## 2020-04-11 DIAGNOSIS — S72001D Fracture of unspecified part of neck of right femur, subsequent encounter for closed fracture with routine healing: Secondary | ICD-10-CM | POA: Diagnosis not present

## 2020-04-11 DIAGNOSIS — L97221 Non-pressure chronic ulcer of left calf limited to breakdown of skin: Secondary | ICD-10-CM | POA: Diagnosis not present

## 2020-04-11 DIAGNOSIS — B351 Tinea unguium: Secondary | ICD-10-CM | POA: Diagnosis not present

## 2020-04-11 NOTE — Progress Notes (Signed)
Subjective: Sarah [name] presents today for follow up of painful mycotic toenails b/l that are difficult to trim. Pain interferes with ambulation. Aggravating factors include wearing enclosed shoe gear. Pain is relieved with periodic professional debridement.   Her caregiver, Sarah [name], is present during today's visit. [place] states Sarah [name] has been wearing soft house slippers daily.  They voice no new pedal problems on today's visit.  PCP is Dr. [name] and last visit was 04/06/2020  Allergies  Allergen Reactions  . Codeine Anaphylaxis  . Hydrocodone Nausea And Vomiting and Other (See Comments)    SYNCOPE AND BRADYCARDIA  . Isosorbide Other (See Comments)    BRADYCARDIA  . Isosorbide Nitrate Other (See Comments)    BRADYCARDIA  . Oxycodone Palpitations    Rapid heart beat  . Clarithromycin Nausea And Vomiting    Has patient had a PCN reaction causing immediate rash, facial/tongue/throat swelling, SOB or lightheadedness with hypotension: Unknown Has patient had a PCN reaction causing severe rash involving mucus membranes or skin necrosis: Unknown Has patient had a PCN reaction that required hospitalization: Unknown Has patient had a PCN reaction occurring within the last 10 years: Unknown If all of the above answers are "NO", then may proceed with Cephalosporin use.   . Fiorinal [Butalbital-Aspirin-Caffeine] Swelling and Other (See Comments)    Facial swelling   . Levofloxacin Nausea And Vomiting and Other (See Comments)    SYNCOPE ALSO  . Augmentin [Amoxicillin-Pot Clavulanate] Diarrhea  . Doxycycline     Sweats and aches  . Tylenol With Codeine #3 [Acetaminophen-Codeine]     Other reaction(s): angioedema     Objective: There were no vitals filed for this visit.  Pt 87 y.o. year old female  in NAD. AAO x 3.   Vascular Examination:  Capillary fill time to digits <3 seconds b/l lower extremities. Faintly palpable DP pulse(s) b/l lower extremities. Nonpalpable PT  pulse(s) b/l lower extremities. Pedal hair absent. Lower extremity skin temperature gradient warm to cool. No pain with calf compression b/l. Trace edema noted b/l lower extremities. No ischemia or gangrene noted b/l lower extremities.  Dermatological Examination: Pedal skin is thin shiny, atrophic b/l lower extremities. No interdigital macerations bilaterally. Toenails 1-5 b/l elongated, discolored, dystrophic, thickened, crumbly with subungual debris and tenderness to dorsal palpation. Hyperkeratotic lesion(s) L 4th toe, R 2nd toe and R 5th toe.  No erythema, no edema, no drainage, no fluctuance.   Musculoskeletal: Normal muscle strength 5/5 to all lower extremity muscle groups bilaterally, no pain crepitus or joint limitation noted with ROM b/l and hammertoes noted to the  2-5 bilaterally. Wearing soft house slippers on today's visit.  Neurological: Pt has subjective symptoms of neuropathy. Protective sensation intact 5/5 intact bilaterally with 10g monofilament b/l.Marland Kitchen  Assessment: 1. Pain due to onychomycosis of toenails of both feet   2. Corns   3. PAD (peripheral artery disease) (HCC)     Plan: -Patient examined. -Continue soft, supportive shoe gear daily. -Toenails 1-5 b/l were debrided in length and girth with sterile nail nippers and dremel without iatrogenic bleeding.  -Corn(s) debrided right 2nd toe, right 5th toe, and  left 4th toe without incident. Total number debrided=2. -Patient to continue soft, supportive shoe gear daily. -Patient to report any pedal injuries to medical professional immediately. -Patient/POA to call should there be question/concern in the interim.  Return in about 3 months (around 07/12/2020).

## 2020-04-12 DIAGNOSIS — S72001D Fracture of unspecified part of neck of right femur, subsequent encounter for closed fracture with routine healing: Secondary | ICD-10-CM | POA: Diagnosis not present

## 2020-04-12 DIAGNOSIS — J9601 Acute respiratory failure with hypoxia: Secondary | ICD-10-CM | POA: Diagnosis not present

## 2020-04-12 DIAGNOSIS — I872 Venous insufficiency (chronic) (peripheral): Secondary | ICD-10-CM | POA: Diagnosis not present

## 2020-04-12 DIAGNOSIS — L97821 Non-pressure chronic ulcer of other part of left lower leg limited to breakdown of skin: Secondary | ICD-10-CM | POA: Diagnosis not present

## 2020-04-12 DIAGNOSIS — I130 Hypertensive heart and chronic kidney disease with heart failure and stage 1 through stage 4 chronic kidney disease, or unspecified chronic kidney disease: Secondary | ICD-10-CM | POA: Diagnosis not present

## 2020-04-12 DIAGNOSIS — L97221 Non-pressure chronic ulcer of left calf limited to breakdown of skin: Secondary | ICD-10-CM | POA: Diagnosis not present

## 2020-04-13 ENCOUNTER — Other Ambulatory Visit: Payer: Self-pay

## 2020-04-13 ENCOUNTER — Other Ambulatory Visit: Payer: Medicare Other

## 2020-04-13 DIAGNOSIS — Z515 Encounter for palliative care: Secondary | ICD-10-CM

## 2020-04-13 DIAGNOSIS — L97821 Non-pressure chronic ulcer of other part of left lower leg limited to breakdown of skin: Secondary | ICD-10-CM | POA: Diagnosis not present

## 2020-04-13 DIAGNOSIS — I872 Venous insufficiency (chronic) (peripheral): Secondary | ICD-10-CM | POA: Diagnosis not present

## 2020-04-13 DIAGNOSIS — L97221 Non-pressure chronic ulcer of left calf limited to breakdown of skin: Secondary | ICD-10-CM | POA: Diagnosis not present

## 2020-04-13 DIAGNOSIS — J9601 Acute respiratory failure with hypoxia: Secondary | ICD-10-CM | POA: Diagnosis not present

## 2020-04-13 DIAGNOSIS — I130 Hypertensive heart and chronic kidney disease with heart failure and stage 1 through stage 4 chronic kidney disease, or unspecified chronic kidney disease: Secondary | ICD-10-CM | POA: Diagnosis not present

## 2020-04-13 DIAGNOSIS — S72001D Fracture of unspecified part of neck of right femur, subsequent encounter for closed fracture with routine healing: Secondary | ICD-10-CM | POA: Diagnosis not present

## 2020-04-14 DIAGNOSIS — J9601 Acute respiratory failure with hypoxia: Secondary | ICD-10-CM | POA: Diagnosis not present

## 2020-04-14 DIAGNOSIS — L97221 Non-pressure chronic ulcer of left calf limited to breakdown of skin: Secondary | ICD-10-CM | POA: Diagnosis not present

## 2020-04-14 DIAGNOSIS — L97821 Non-pressure chronic ulcer of other part of left lower leg limited to breakdown of skin: Secondary | ICD-10-CM | POA: Diagnosis not present

## 2020-04-14 DIAGNOSIS — S72001D Fracture of unspecified part of neck of right femur, subsequent encounter for closed fracture with routine healing: Secondary | ICD-10-CM | POA: Diagnosis not present

## 2020-04-14 DIAGNOSIS — I872 Venous insufficiency (chronic) (peripheral): Secondary | ICD-10-CM | POA: Diagnosis not present

## 2020-04-14 DIAGNOSIS — I130 Hypertensive heart and chronic kidney disease with heart failure and stage 1 through stage 4 chronic kidney disease, or unspecified chronic kidney disease: Secondary | ICD-10-CM | POA: Diagnosis not present

## 2020-04-14 NOTE — Progress Notes (Signed)
COMMUNITY PALLIATIVE CARE SW NOTE  PATIENT NAME: Sarah [name] DOB: 1921/08/04 MRN: [id number]  PRIMARY CARE PROVIDER: [name], MD  RESPONSIBLE PARTY:  Acct ID - Guarantor Home Phone Work Phone Relationship Acct Type  [id number] - [name],[name] [phone]  Self P/F     [address], [place], [place] [address]     PLAN OF CARE and INTERVENTIONS:             1. GOALS OF CARE/ ADVANCE CARE PLANNING:  Goal is for patient to remain in her apartment, independent and safe. Patient is a DNR. 2. SOCIAL/EMOTIONAL/SPIRITUAL ASSESSMENT/ INTERVENTIONS:  SW visited with patient at her independent apartment. She was returning to her apartment on her walker following physical therapy. Patient immediately layed on the couch and elevated her legs. She showed patient her wrapped legs. She advised that she was instructed by the [facility] nurse who wraps her legs 2x week to keep them elevated to avoid any edema. Patient reported she elevates her legs between therapy and meals. She feels that she is progressing well with physical and occupational therapy. SW provided active listening as patient engaged in open dialogue and life review.SW also provided supportive counseling and encouragement to continue therapy as she has progressed well.  SW assessed needs, comfort and coping. Patient remains open to ongoing palliative care support and visits.  3. PATIENT/CAREGIVER EDUCATION/ COPING:  Patient seems to be coping well. She appeared to be less anxious today than previous visits. She remains forgetful. She has strong family support.  4. PERSONAL EMERGENCY PLAN:  Per facility protocol. 5. COMMUNITY RESOURCES COORDINATION/ HEALTH CARE NAVIGATION:  Patient continues to receive physical and occupational therapy International aid/development worker). She also receives personal assistance and medication reminders through [facility] daily.  6. FINANCIAL/LEGAL CONCERNS/INTERVENTIONS: None.      SOCIAL HX:  Social History   Tobacco Use  .  Smoking status: Never Smoker  . Smokeless tobacco: Never Used  Substance Use Topics  . Alcohol use: Not Currently    CODE STATUS: DNR ADVANCED DIRECTIVES: Yes MOST FORM COMPLETE: Yes HOSPICE EDUCATION PROVIDED: No  PPS:  Patient is alert and oriented x3, but admits that she is forgetful. Sheis ambulating with her walker. She continues to receive daily personal careand medication reminders. Patient continues to receive physical and occupational therapy through [facility] and is progressing well.  Duration of visit and documentation:57minutes       [employer], LCSW

## 2020-04-17 DIAGNOSIS — Z1159 Encounter for screening for other viral diseases: Secondary | ICD-10-CM | POA: Diagnosis not present

## 2020-04-17 DIAGNOSIS — Z20828 Contact with and (suspected) exposure to other viral communicable diseases: Secondary | ICD-10-CM | POA: Diagnosis not present

## 2020-04-18 DIAGNOSIS — I872 Venous insufficiency (chronic) (peripheral): Secondary | ICD-10-CM | POA: Diagnosis not present

## 2020-04-18 DIAGNOSIS — L97821 Non-pressure chronic ulcer of other part of left lower leg limited to breakdown of skin: Secondary | ICD-10-CM | POA: Diagnosis not present

## 2020-04-18 DIAGNOSIS — I130 Hypertensive heart and chronic kidney disease with heart failure and stage 1 through stage 4 chronic kidney disease, or unspecified chronic kidney disease: Secondary | ICD-10-CM | POA: Diagnosis not present

## 2020-04-18 DIAGNOSIS — S72001D Fracture of unspecified part of neck of right femur, subsequent encounter for closed fracture with routine healing: Secondary | ICD-10-CM | POA: Diagnosis not present

## 2020-04-18 DIAGNOSIS — L97221 Non-pressure chronic ulcer of left calf limited to breakdown of skin: Secondary | ICD-10-CM | POA: Diagnosis not present

## 2020-04-18 DIAGNOSIS — J9601 Acute respiratory failure with hypoxia: Secondary | ICD-10-CM | POA: Diagnosis not present

## 2020-04-19 DIAGNOSIS — L97221 Non-pressure chronic ulcer of left calf limited to breakdown of skin: Secondary | ICD-10-CM | POA: Diagnosis not present

## 2020-04-19 DIAGNOSIS — L97821 Non-pressure chronic ulcer of other part of left lower leg limited to breakdown of skin: Secondary | ICD-10-CM | POA: Diagnosis not present

## 2020-04-19 DIAGNOSIS — J9601 Acute respiratory failure with hypoxia: Secondary | ICD-10-CM | POA: Diagnosis not present

## 2020-04-19 DIAGNOSIS — S72001D Fracture of unspecified part of neck of right femur, subsequent encounter for closed fracture with routine healing: Secondary | ICD-10-CM | POA: Diagnosis not present

## 2020-04-19 DIAGNOSIS — I130 Hypertensive heart and chronic kidney disease with heart failure and stage 1 through stage 4 chronic kidney disease, or unspecified chronic kidney disease: Secondary | ICD-10-CM | POA: Diagnosis not present

## 2020-04-19 DIAGNOSIS — I872 Venous insufficiency (chronic) (peripheral): Secondary | ICD-10-CM | POA: Diagnosis not present

## 2020-04-20 DIAGNOSIS — I872 Venous insufficiency (chronic) (peripheral): Secondary | ICD-10-CM | POA: Diagnosis not present

## 2020-04-20 DIAGNOSIS — J9601 Acute respiratory failure with hypoxia: Secondary | ICD-10-CM | POA: Diagnosis not present

## 2020-04-20 DIAGNOSIS — I130 Hypertensive heart and chronic kidney disease with heart failure and stage 1 through stage 4 chronic kidney disease, or unspecified chronic kidney disease: Secondary | ICD-10-CM | POA: Diagnosis not present

## 2020-04-20 DIAGNOSIS — L97821 Non-pressure chronic ulcer of other part of left lower leg limited to breakdown of skin: Secondary | ICD-10-CM | POA: Diagnosis not present

## 2020-04-20 DIAGNOSIS — L97221 Non-pressure chronic ulcer of left calf limited to breakdown of skin: Secondary | ICD-10-CM | POA: Diagnosis not present

## 2020-04-20 DIAGNOSIS — S72001D Fracture of unspecified part of neck of right femur, subsequent encounter for closed fracture with routine healing: Secondary | ICD-10-CM | POA: Diagnosis not present

## 2020-04-21 DIAGNOSIS — I130 Hypertensive heart and chronic kidney disease with heart failure and stage 1 through stage 4 chronic kidney disease, or unspecified chronic kidney disease: Secondary | ICD-10-CM | POA: Diagnosis not present

## 2020-04-21 DIAGNOSIS — S72001D Fracture of unspecified part of neck of right femur, subsequent encounter for closed fracture with routine healing: Secondary | ICD-10-CM | POA: Diagnosis not present

## 2020-04-21 DIAGNOSIS — I872 Venous insufficiency (chronic) (peripheral): Secondary | ICD-10-CM | POA: Diagnosis not present

## 2020-04-21 DIAGNOSIS — L97821 Non-pressure chronic ulcer of other part of left lower leg limited to breakdown of skin: Secondary | ICD-10-CM | POA: Diagnosis not present

## 2020-04-21 DIAGNOSIS — L97221 Non-pressure chronic ulcer of left calf limited to breakdown of skin: Secondary | ICD-10-CM | POA: Diagnosis not present

## 2020-04-21 DIAGNOSIS — J9601 Acute respiratory failure with hypoxia: Secondary | ICD-10-CM | POA: Diagnosis not present

## 2020-04-22 DIAGNOSIS — K449 Diaphragmatic hernia without obstruction or gangrene: Secondary | ICD-10-CM | POA: Diagnosis not present

## 2020-04-22 DIAGNOSIS — E039 Hypothyroidism, unspecified: Secondary | ICD-10-CM | POA: Diagnosis not present

## 2020-04-22 DIAGNOSIS — Z8673 Personal history of transient ischemic attack (TIA), and cerebral infarction without residual deficits: Secondary | ICD-10-CM | POA: Diagnosis not present

## 2020-04-22 DIAGNOSIS — E785 Hyperlipidemia, unspecified: Secondary | ICD-10-CM | POA: Diagnosis not present

## 2020-04-22 DIAGNOSIS — B965 Pseudomonas (aeruginosa) (mallei) (pseudomallei) as the cause of diseases classified elsewhere: Secondary | ICD-10-CM | POA: Diagnosis not present

## 2020-04-22 DIAGNOSIS — Z7952 Long term (current) use of systemic steroids: Secondary | ICD-10-CM | POA: Diagnosis not present

## 2020-04-22 DIAGNOSIS — K219 Gastro-esophageal reflux disease without esophagitis: Secondary | ICD-10-CM | POA: Diagnosis not present

## 2020-04-22 DIAGNOSIS — F411 Generalized anxiety disorder: Secondary | ICD-10-CM | POA: Diagnosis not present

## 2020-04-22 DIAGNOSIS — J9601 Acute respiratory failure with hypoxia: Secondary | ICD-10-CM | POA: Diagnosis not present

## 2020-04-22 DIAGNOSIS — M1990 Unspecified osteoarthritis, unspecified site: Secondary | ICD-10-CM | POA: Diagnosis not present

## 2020-04-22 DIAGNOSIS — L97221 Non-pressure chronic ulcer of left calf limited to breakdown of skin: Secondary | ICD-10-CM | POA: Diagnosis not present

## 2020-04-22 DIAGNOSIS — I872 Venous insufficiency (chronic) (peripheral): Secondary | ICD-10-CM | POA: Diagnosis not present

## 2020-04-22 DIAGNOSIS — S72001D Fracture of unspecified part of neck of right femur, subsequent encounter for closed fracture with routine healing: Secondary | ICD-10-CM | POA: Diagnosis not present

## 2020-04-22 DIAGNOSIS — Z7902 Long term (current) use of antithrombotics/antiplatelets: Secondary | ICD-10-CM | POA: Diagnosis not present

## 2020-04-22 DIAGNOSIS — F32A Depression, unspecified: Secondary | ICD-10-CM | POA: Diagnosis not present

## 2020-04-22 DIAGNOSIS — H9193 Unspecified hearing loss, bilateral: Secondary | ICD-10-CM | POA: Diagnosis not present

## 2020-04-22 DIAGNOSIS — I130 Hypertensive heart and chronic kidney disease with heart failure and stage 1 through stage 4 chronic kidney disease, or unspecified chronic kidney disease: Secondary | ICD-10-CM | POA: Diagnosis not present

## 2020-04-22 DIAGNOSIS — J479 Bronchiectasis, uncomplicated: Secondary | ICD-10-CM | POA: Diagnosis not present

## 2020-04-22 DIAGNOSIS — G609 Hereditary and idiopathic neuropathy, unspecified: Secondary | ICD-10-CM | POA: Diagnosis not present

## 2020-04-22 DIAGNOSIS — D509 Iron deficiency anemia, unspecified: Secondary | ICD-10-CM | POA: Diagnosis not present

## 2020-04-22 DIAGNOSIS — E559 Vitamin D deficiency, unspecified: Secondary | ICD-10-CM | POA: Diagnosis not present

## 2020-04-22 DIAGNOSIS — I2510 Atherosclerotic heart disease of native coronary artery without angina pectoris: Secondary | ICD-10-CM | POA: Diagnosis not present

## 2020-04-22 DIAGNOSIS — N183 Chronic kidney disease, stage 3 unspecified: Secondary | ICD-10-CM | POA: Diagnosis not present

## 2020-04-22 DIAGNOSIS — I5032 Chronic diastolic (congestive) heart failure: Secondary | ICD-10-CM | POA: Diagnosis not present

## 2020-04-22 DIAGNOSIS — L97821 Non-pressure chronic ulcer of other part of left lower leg limited to breakdown of skin: Secondary | ICD-10-CM | POA: Diagnosis not present

## 2020-04-24 DIAGNOSIS — Z1159 Encounter for screening for other viral diseases: Secondary | ICD-10-CM | POA: Diagnosis not present

## 2020-04-24 DIAGNOSIS — Z20828 Contact with and (suspected) exposure to other viral communicable diseases: Secondary | ICD-10-CM | POA: Diagnosis not present

## 2020-04-24 DIAGNOSIS — L97821 Non-pressure chronic ulcer of other part of left lower leg limited to breakdown of skin: Secondary | ICD-10-CM | POA: Diagnosis not present

## 2020-04-24 DIAGNOSIS — I872 Venous insufficiency (chronic) (peripheral): Secondary | ICD-10-CM | POA: Diagnosis not present

## 2020-04-24 DIAGNOSIS — I130 Hypertensive heart and chronic kidney disease with heart failure and stage 1 through stage 4 chronic kidney disease, or unspecified chronic kidney disease: Secondary | ICD-10-CM | POA: Diagnosis not present

## 2020-04-24 DIAGNOSIS — S72001D Fracture of unspecified part of neck of right femur, subsequent encounter for closed fracture with routine healing: Secondary | ICD-10-CM | POA: Diagnosis not present

## 2020-04-24 DIAGNOSIS — J9601 Acute respiratory failure with hypoxia: Secondary | ICD-10-CM | POA: Diagnosis not present

## 2020-04-24 DIAGNOSIS — L97221 Non-pressure chronic ulcer of left calf limited to breakdown of skin: Secondary | ICD-10-CM | POA: Diagnosis not present

## 2020-04-25 DIAGNOSIS — J9601 Acute respiratory failure with hypoxia: Secondary | ICD-10-CM | POA: Diagnosis not present

## 2020-04-25 DIAGNOSIS — I130 Hypertensive heart and chronic kidney disease with heart failure and stage 1 through stage 4 chronic kidney disease, or unspecified chronic kidney disease: Secondary | ICD-10-CM | POA: Diagnosis not present

## 2020-04-25 DIAGNOSIS — L97221 Non-pressure chronic ulcer of left calf limited to breakdown of skin: Secondary | ICD-10-CM | POA: Diagnosis not present

## 2020-04-25 DIAGNOSIS — L97821 Non-pressure chronic ulcer of other part of left lower leg limited to breakdown of skin: Secondary | ICD-10-CM | POA: Diagnosis not present

## 2020-04-25 DIAGNOSIS — I872 Venous insufficiency (chronic) (peripheral): Secondary | ICD-10-CM | POA: Diagnosis not present

## 2020-04-25 DIAGNOSIS — S72001D Fracture of unspecified part of neck of right femur, subsequent encounter for closed fracture with routine healing: Secondary | ICD-10-CM | POA: Diagnosis not present

## 2020-04-27 DIAGNOSIS — Z96641 Presence of right artificial hip joint: Secondary | ICD-10-CM | POA: Diagnosis not present

## 2020-04-27 DIAGNOSIS — I890 Lymphedema, not elsewhere classified: Secondary | ICD-10-CM | POA: Diagnosis not present

## 2020-04-27 DIAGNOSIS — S81801A Unspecified open wound, right lower leg, initial encounter: Secondary | ICD-10-CM | POA: Diagnosis not present

## 2020-04-27 DIAGNOSIS — F321 Major depressive disorder, single episode, moderate: Secondary | ICD-10-CM | POA: Diagnosis not present

## 2020-04-27 DIAGNOSIS — F411 Generalized anxiety disorder: Secondary | ICD-10-CM | POA: Diagnosis not present

## 2020-04-28 DIAGNOSIS — J9601 Acute respiratory failure with hypoxia: Secondary | ICD-10-CM | POA: Diagnosis not present

## 2020-04-28 DIAGNOSIS — L97221 Non-pressure chronic ulcer of left calf limited to breakdown of skin: Secondary | ICD-10-CM | POA: Diagnosis not present

## 2020-04-28 DIAGNOSIS — S72001D Fracture of unspecified part of neck of right femur, subsequent encounter for closed fracture with routine healing: Secondary | ICD-10-CM | POA: Diagnosis not present

## 2020-04-28 DIAGNOSIS — I130 Hypertensive heart and chronic kidney disease with heart failure and stage 1 through stage 4 chronic kidney disease, or unspecified chronic kidney disease: Secondary | ICD-10-CM | POA: Diagnosis not present

## 2020-04-28 DIAGNOSIS — L97821 Non-pressure chronic ulcer of other part of left lower leg limited to breakdown of skin: Secondary | ICD-10-CM | POA: Diagnosis not present

## 2020-04-28 DIAGNOSIS — I872 Venous insufficiency (chronic) (peripheral): Secondary | ICD-10-CM | POA: Diagnosis not present

## 2020-05-01 DIAGNOSIS — Z20828 Contact with and (suspected) exposure to other viral communicable diseases: Secondary | ICD-10-CM | POA: Diagnosis not present

## 2020-05-01 DIAGNOSIS — Z1159 Encounter for screening for other viral diseases: Secondary | ICD-10-CM | POA: Diagnosis not present

## 2020-05-02 DIAGNOSIS — I872 Venous insufficiency (chronic) (peripheral): Secondary | ICD-10-CM | POA: Diagnosis not present

## 2020-05-02 DIAGNOSIS — L97821 Non-pressure chronic ulcer of other part of left lower leg limited to breakdown of skin: Secondary | ICD-10-CM | POA: Diagnosis not present

## 2020-05-02 DIAGNOSIS — J9601 Acute respiratory failure with hypoxia: Secondary | ICD-10-CM | POA: Diagnosis not present

## 2020-05-02 DIAGNOSIS — L97221 Non-pressure chronic ulcer of left calf limited to breakdown of skin: Secondary | ICD-10-CM | POA: Diagnosis not present

## 2020-05-02 DIAGNOSIS — S72001D Fracture of unspecified part of neck of right femur, subsequent encounter for closed fracture with routine healing: Secondary | ICD-10-CM | POA: Diagnosis not present

## 2020-05-02 DIAGNOSIS — I130 Hypertensive heart and chronic kidney disease with heart failure and stage 1 through stage 4 chronic kidney disease, or unspecified chronic kidney disease: Secondary | ICD-10-CM | POA: Diagnosis not present

## 2020-05-05 DIAGNOSIS — I130 Hypertensive heart and chronic kidney disease with heart failure and stage 1 through stage 4 chronic kidney disease, or unspecified chronic kidney disease: Secondary | ICD-10-CM | POA: Diagnosis not present

## 2020-05-05 DIAGNOSIS — I872 Venous insufficiency (chronic) (peripheral): Secondary | ICD-10-CM | POA: Diagnosis not present

## 2020-05-05 DIAGNOSIS — J9601 Acute respiratory failure with hypoxia: Secondary | ICD-10-CM | POA: Diagnosis not present

## 2020-05-05 DIAGNOSIS — S72001D Fracture of unspecified part of neck of right femur, subsequent encounter for closed fracture with routine healing: Secondary | ICD-10-CM | POA: Diagnosis not present

## 2020-05-05 DIAGNOSIS — L97821 Non-pressure chronic ulcer of other part of left lower leg limited to breakdown of skin: Secondary | ICD-10-CM | POA: Diagnosis not present

## 2020-05-05 DIAGNOSIS — L97221 Non-pressure chronic ulcer of left calf limited to breakdown of skin: Secondary | ICD-10-CM | POA: Diagnosis not present

## 2020-05-08 ENCOUNTER — Ambulatory Visit (INDEPENDENT_AMBULATORY_CARE_PROVIDER_SITE_OTHER): Payer: Medicare Other | Admitting: Emergency Medicine

## 2020-05-08 ENCOUNTER — Encounter: Payer: Self-pay | Admitting: Emergency Medicine

## 2020-05-08 ENCOUNTER — Other Ambulatory Visit: Payer: Self-pay

## 2020-05-08 DIAGNOSIS — Z20828 Contact with and (suspected) exposure to other viral communicable diseases: Secondary | ICD-10-CM | POA: Diagnosis not present

## 2020-05-08 DIAGNOSIS — Z1159 Encounter for screening for other viral diseases: Secondary | ICD-10-CM | POA: Diagnosis not present

## 2020-05-08 DIAGNOSIS — J479 Bronchiectasis, uncomplicated: Secondary | ICD-10-CM

## 2020-05-08 MED ORDER — AZITHROMYCIN 250 MG PO TABS: ORAL_TABLET | ORAL | 2 refills | Status: DC

## 2020-05-08 MED ORDER — CEFDINIR 300 MG PO CAPS: 300.0000 mg | ORAL_CAPSULE | ORAL | 2 refills | Status: DC

## 2020-05-08 NOTE — Progress Notes (Signed)
   Subjective:    Patient ID: Sarah [name], female    DOB: 1921/08/26, 87 y.o.   MRN: [id number]  HPI  ROV 05/08/20 --87 year old woman whom I have followed for chronic cough and asthmatic bronchitis in the setting of rhinitis, GERD and significant bronchiectasis.  She is colonized with Pseudomonas.  She has been managed on rotating antibiotics (azithromycin and Omnicef), chronic prednisone 10 mg daily, scheduled albuterol nebs with chest vest for secretion clearance. She is been doing physical therapy following a fall and hip fracture 01/2020  She reports > she has been doing well, has surpassed expectations with PT/ OT. Her breathing is . She is clearing mucous, often daily. Taking her albuterol bid. She is not using her chest vest every day.  She has noted some rash on her scalp, some blurred vision, HA. She has been on pred 10mg  for several years, is concerned about these side effects.   Last CT scan of the chest was 07/02/2019.  Most recent chest x-ray 01/25/2020   Review of Systems As above      Objective:   Physical Exam  Vitals:   05/08/20 1525  BP: 122/70  Pulse: 91  Temp: 98 F (36.7 C)  TempSrc: Temporal  SpO2: 92%  Weight: 113 lb 9.6 oz (51.5 kg)  Height: 5\' 3"  (1.6 m)    GEN: A/Ox3, a bit anxious, NAD, elderly and thin.    HEENT:  OP clear  NECK: no stridor  RESP distant, B insp rhonchi, insp squeak B upper zones  CARD:  Regular, no M  Musco: some chronic venous stasis, bilateral ankle edema.   Neuro: awake, poor hearing, oriented       Assessment & Plan:  BRONCHIECTASIS Overall stable.  She does have daily secretion issues but is clearing adequately.  She is not using her chest vest every day and have encouraged her to do so.  Otherwise continue her same regimen.  We did talk today about the pros and cons, side effects of prednisone.  She is experiencing some of these.  The last time we try to withdraw, decrease her prednisone she did not tolerate this  from a respiratory standpoint.  In the end we decided to leave her on the same dose 10 mg daily.  Please continue prednisone 10 mg daily. Continue your albuterol nebulizers twice a day to help with mucus clearance.  You could also use this if needed for any additional shortness of breath, coughing spells, wheezing. Continue rotating azithromycin and Omnicef at the beginning of each month Continue your chest vest.  Try to use this once daily if possible. Continue to work with physical therapy Follow with Dr 05/10/20 in 6 months or sooner if you have any problems  , MD, PhD 05/08/2020, 3:43 PM [name] [phone] or if no answer [phone]

## 2020-05-08 NOTE — Patient Instructions (Signed)
Please continue prednisone 10 mg daily. Continue your albuterol nebulizers twice a day to help with mucus clearance.  You could also use this if needed for any additional shortness of breath, coughing spells, wheezing. Continue rotating azithromycin and Omnicef at the beginning of each month Continue your chest vest.  Try to use this once daily if possible. Continue to work with physical therapy Follow with Dr Delton Coombes in 6 months or sooner if you have any problems

## 2020-05-08 NOTE — Assessment & Plan Note (Signed)
Overall stable.  She does have daily secretion issues but is clearing adequately.  She is not using her chest vest every day and have encouraged her to do so.  Otherwise continue her same regimen.  We did talk today about the pros and cons, side effects of prednisone.  She is experiencing some of these.  The last time we try to withdraw, decrease her prednisone she did not tolerate this from a respiratory standpoint.  In the end we decided to leave her on the same dose 10 mg daily.  Please continue prednisone 10 mg daily. Continue your albuterol nebulizers twice a day to help with mucus clearance.  You could also use this if needed for any additional shortness of breath, coughing spells, wheezing. Continue rotating azithromycin and Omnicef at the beginning of each month Continue your chest vest.  Try to use this once daily if possible. Continue to work with physical therapy Follow with Dr Delton Coombes in 6 months or sooner if you have any problems

## 2020-05-08 NOTE — Addendum Note (Signed)
Addended by: Dorisann Frames R on: 05/08/2020 03:48 PM   Modules accepted: Orders

## 2020-05-09 ENCOUNTER — Other Ambulatory Visit: Payer: Medicare Other

## 2020-05-09 DIAGNOSIS — L97821 Non-pressure chronic ulcer of other part of left lower leg limited to breakdown of skin: Secondary | ICD-10-CM | POA: Diagnosis not present

## 2020-05-09 DIAGNOSIS — Z515 Encounter for palliative care: Secondary | ICD-10-CM

## 2020-05-09 DIAGNOSIS — S72001D Fracture of unspecified part of neck of right femur, subsequent encounter for closed fracture with routine healing: Secondary | ICD-10-CM | POA: Diagnosis not present

## 2020-05-09 DIAGNOSIS — L97221 Non-pressure chronic ulcer of left calf limited to breakdown of skin: Secondary | ICD-10-CM | POA: Diagnosis not present

## 2020-05-09 DIAGNOSIS — J9601 Acute respiratory failure with hypoxia: Secondary | ICD-10-CM | POA: Diagnosis not present

## 2020-05-09 DIAGNOSIS — I872 Venous insufficiency (chronic) (peripheral): Secondary | ICD-10-CM | POA: Diagnosis not present

## 2020-05-09 DIAGNOSIS — I130 Hypertensive heart and chronic kidney disease with heart failure and stage 1 through stage 4 chronic kidney disease, or unspecified chronic kidney disease: Secondary | ICD-10-CM | POA: Diagnosis not present

## 2020-05-09 NOTE — Progress Notes (Signed)
COMMUNITY PALLIATIVE CARE SW NOTE  PATIENT NAME: Sarah [name] DOB: [birth year] MRN: [id number]  PRIMARY CARE PROVIDER: [name], MD  RESPONSIBLE PARTY:  Acct ID - Guarantor Home Phone Work Phone Relationship Acct Type  [id number] - Newey,[name] [phone]  Self P/F     [address], [place], [place] [address]     PLAN OF CARE and INTERVENTIONS:             1. GOALS OF CARE/ ADVANCE CARE PLANNING:  Goal is for patient to remain in her independent apartment. Patient is a DNR, form is in the home.  2. SOCIAL/EMOTIONAL/SPIRITUAL ASSESSMENT/ INTERVENTIONS:  SW completed a face-to-face with patient visit with patient. Patient was present in the dinning room, sitting with her peers. Patient reported that she had physical therapy prior to lunch and was a little tired, but was also ready to eat. She denied pain. She report that she still has intermittent breathing issues, particularly with any activity. She continues to participate in physical therapy. She reports ongoing edema, but it is being managed by keeping her legs up. Patient report that she "doing much better for someone at my age". Patient remains open to ongoing support from the palliative care team. SW will provide ongoing assessment of patient's needs and comfort and will provide support as needed.  3. PATIENT/CAREGIVER EDUCATION/ COPING:  Patient continues to cope well. Patient remains alert and oriented x3, relatively independent and has good family support.  4. PERSONAL EMERGENCY PLAN:  Per facility protocol.  5. COMMUNITY RESOURCES COORDINATION/ HEALTH CARE NAVIGATION:  Patient continues to receiving therapy and is progressing well.  6. FINANCIAL/LEGAL CONCERNS/INTERVENTIONS:  None.     SOCIAL HX:  Social History   Tobacco Use  . Smoking status: Never Smoker  . Smokeless tobacco: Never Used  Substance Use Topics  . Alcohol use: Not Currently    CODE STATUS: DNR ADVANCED DIRECTIVES: Yes MOST FORM COMPLETE:  Yes HOSPICE EDUCATION PROVIDED: No  PPS: Patient is alert and oriented x3, but admits that she is forgetful. Sheis ambulating with her walker. She continues to receive daily personal careand medication reminders. Patient continues to receive physical and occupational therapy through [facility] and is progressing well.  Duration of visit and documentation:73minutes      [employer], LCSW

## 2020-05-12 DIAGNOSIS — J9601 Acute respiratory failure with hypoxia: Secondary | ICD-10-CM | POA: Diagnosis not present

## 2020-05-12 DIAGNOSIS — S72001D Fracture of unspecified part of neck of right femur, subsequent encounter for closed fracture with routine healing: Secondary | ICD-10-CM | POA: Diagnosis not present

## 2020-05-12 DIAGNOSIS — I130 Hypertensive heart and chronic kidney disease with heart failure and stage 1 through stage 4 chronic kidney disease, or unspecified chronic kidney disease: Secondary | ICD-10-CM | POA: Diagnosis not present

## 2020-05-12 DIAGNOSIS — L97821 Non-pressure chronic ulcer of other part of left lower leg limited to breakdown of skin: Secondary | ICD-10-CM | POA: Diagnosis not present

## 2020-05-12 DIAGNOSIS — I872 Venous insufficiency (chronic) (peripheral): Secondary | ICD-10-CM | POA: Diagnosis not present

## 2020-05-12 DIAGNOSIS — L97221 Non-pressure chronic ulcer of left calf limited to breakdown of skin: Secondary | ICD-10-CM | POA: Diagnosis not present

## 2020-05-15 DIAGNOSIS — Z20828 Contact with and (suspected) exposure to other viral communicable diseases: Secondary | ICD-10-CM | POA: Diagnosis not present

## 2020-05-15 DIAGNOSIS — Z1159 Encounter for screening for other viral diseases: Secondary | ICD-10-CM | POA: Diagnosis not present

## 2020-05-16 DIAGNOSIS — S72001D Fracture of unspecified part of neck of right femur, subsequent encounter for closed fracture with routine healing: Secondary | ICD-10-CM | POA: Diagnosis not present

## 2020-05-16 DIAGNOSIS — I872 Venous insufficiency (chronic) (peripheral): Secondary | ICD-10-CM | POA: Diagnosis not present

## 2020-05-16 DIAGNOSIS — L97221 Non-pressure chronic ulcer of left calf limited to breakdown of skin: Secondary | ICD-10-CM | POA: Diagnosis not present

## 2020-05-16 DIAGNOSIS — J9601 Acute respiratory failure with hypoxia: Secondary | ICD-10-CM | POA: Diagnosis not present

## 2020-05-16 DIAGNOSIS — I130 Hypertensive heart and chronic kidney disease with heart failure and stage 1 through stage 4 chronic kidney disease, or unspecified chronic kidney disease: Secondary | ICD-10-CM | POA: Diagnosis not present

## 2020-05-16 DIAGNOSIS — L97821 Non-pressure chronic ulcer of other part of left lower leg limited to breakdown of skin: Secondary | ICD-10-CM | POA: Diagnosis not present

## 2020-05-18 DIAGNOSIS — I130 Hypertensive heart and chronic kidney disease with heart failure and stage 1 through stage 4 chronic kidney disease, or unspecified chronic kidney disease: Secondary | ICD-10-CM | POA: Diagnosis not present

## 2020-05-18 DIAGNOSIS — L97821 Non-pressure chronic ulcer of other part of left lower leg limited to breakdown of skin: Secondary | ICD-10-CM | POA: Diagnosis not present

## 2020-05-18 DIAGNOSIS — L97221 Non-pressure chronic ulcer of left calf limited to breakdown of skin: Secondary | ICD-10-CM | POA: Diagnosis not present

## 2020-05-18 DIAGNOSIS — J9601 Acute respiratory failure with hypoxia: Secondary | ICD-10-CM | POA: Diagnosis not present

## 2020-05-18 DIAGNOSIS — I872 Venous insufficiency (chronic) (peripheral): Secondary | ICD-10-CM | POA: Diagnosis not present

## 2020-05-18 DIAGNOSIS — S72001D Fracture of unspecified part of neck of right femur, subsequent encounter for closed fracture with routine healing: Secondary | ICD-10-CM | POA: Diagnosis not present

## 2020-05-19 DIAGNOSIS — S72001D Fracture of unspecified part of neck of right femur, subsequent encounter for closed fracture with routine healing: Secondary | ICD-10-CM | POA: Diagnosis not present

## 2020-05-19 DIAGNOSIS — J9601 Acute respiratory failure with hypoxia: Secondary | ICD-10-CM | POA: Diagnosis not present

## 2020-05-19 DIAGNOSIS — I130 Hypertensive heart and chronic kidney disease with heart failure and stage 1 through stage 4 chronic kidney disease, or unspecified chronic kidney disease: Secondary | ICD-10-CM | POA: Diagnosis not present

## 2020-05-19 DIAGNOSIS — L97221 Non-pressure chronic ulcer of left calf limited to breakdown of skin: Secondary | ICD-10-CM | POA: Diagnosis not present

## 2020-05-19 DIAGNOSIS — I872 Venous insufficiency (chronic) (peripheral): Secondary | ICD-10-CM | POA: Diagnosis not present

## 2020-05-19 DIAGNOSIS — L97821 Non-pressure chronic ulcer of other part of left lower leg limited to breakdown of skin: Secondary | ICD-10-CM | POA: Diagnosis not present

## 2020-05-22 DIAGNOSIS — Z20828 Contact with and (suspected) exposure to other viral communicable diseases: Secondary | ICD-10-CM | POA: Diagnosis not present

## 2020-05-22 DIAGNOSIS — Z1159 Encounter for screening for other viral diseases: Secondary | ICD-10-CM | POA: Diagnosis not present

## 2020-05-30 ENCOUNTER — Other Ambulatory Visit: Payer: Self-pay | Admitting: Emergency Medicine

## 2020-06-05 ENCOUNTER — Other Ambulatory Visit: Payer: Self-pay

## 2020-06-05 ENCOUNTER — Other Ambulatory Visit: Payer: Medicare Other

## 2020-06-05 DIAGNOSIS — Z20828 Contact with and (suspected) exposure to other viral communicable diseases: Secondary | ICD-10-CM | POA: Diagnosis not present

## 2020-06-05 DIAGNOSIS — Z515 Encounter for palliative care: Secondary | ICD-10-CM

## 2020-06-05 DIAGNOSIS — Z1159 Encounter for screening for other viral diseases: Secondary | ICD-10-CM | POA: Diagnosis not present

## 2020-06-06 NOTE — Telephone Encounter (Signed)
Per Dr. Delton Coombes 05/08/20 AVS-  Instructions  Please continue prednisone 10 mg daily. Continue your albuterol nebulizers twice a day to help with mucus clearance.  You could also use this if needed for any additional shortness of breath, coughing spells, wheezing. Continue rotating azithromycin and Omnicef at the beginning of each month Continue your chest vest.  Try to use this once daily if possible. Continue to work with physical therapy Follow with Dr Delton Coombes in 6 months or sooner if you have any problems

## 2020-06-06 NOTE — Progress Notes (Signed)
COMMUNITY PALLIATIVE CARE SW NOTE  PATIENT NAME: Sarah [name] DOB: [birth year] MRN: [id number]  PRIMARY CARE PROVIDER: [name], MD  RESPONSIBLE PARTY:  Acct ID - Guarantor Home Phone Work Phone Relationship Acct Type  [id number] - [name],[name] [phone]  Self P/F     [address], [place], [place] [address]     PLAN OF CARE and INTERVENTIONS:             1. GOALS OF CARE/ ADVANCE CARE PLANNING:  Goal is for patient to remain in her apartment. Patient is a DNR.  2. SOCIAL/EMOTIONAL/SPIRITUAL ASSESSMENT/ INTERVENTIONS:  SW completed a visit with patient at her apartment. Patient was sitting on the side of the bed, with her head down. Patient was not fully dressed. However, she got herself up, and dressed and came to talk to SW. Patient states that she was doing better. She showed SW her legs, and how much they have improved. She states that she has been trying to get be extra careful with her legs by putting on her hose and keeping her legs elevated. She report that her appetite has declined significantly and she has lost weight. Her weight is now 104 lbs. Patient report some depression as she fears she may become a burden to her family. She also talked about all the friends that she has lost at the facility-those who did not recover from a fall or pneumonia. She expressed that she is lucky, but does now "feel old". SW provided active and reflective listening to her, while processing, normalizing and validating her feelings; supportive counseling provided. SW encouraged her to continue to be safe, active and try to expand her diet to include things that she does like to increase her appetite. Patient appears thinner and frail in appearance.She seemed short of breath at times while talking. She denied pain. She remains open to ongoing palliative care support.  3. PATIENT/CAREGIVER EDUCATION/ COPING:  Patient report some depression, but appears to be coping well.  4. PERSONAL EMERGENCY PLAN:   Per facility protocol. 5. COMMUNITY RESOURCES COORDINATION/ HEALTH CARE NAVIGATION:  None. Patient has finished physical therapy.  6. FINANCIAL/LEGAL CONCERNS/INTERVENTIONS:  None.     SOCIAL HX:  Social History   Tobacco Use  . Smoking status: Never Smoker  . Smokeless tobacco: Never Used  Substance Use Topics  . Alcohol use: Not Currently    CODE STATUS: DNR  ADVANCED DIRECTIVES: Yes MOST FORM COMPLETE: Yes HOSPICE EDUCATION PROVIDED: No  DVV:OHYWVPX is alert and oriented x3, but admits that she is forgetful. Sheis ambulating with her wheelchair outside of her apartment and independently inside the apartment. Shecontinues toreceive daily personal careand medication reminders.   Duration of visit and documentation:15minutes       [employer], LCSW

## 2020-06-07 DIAGNOSIS — Z23 Encounter for immunization: Secondary | ICD-10-CM | POA: Diagnosis not present

## 2020-06-12 DIAGNOSIS — Z1159 Encounter for screening for other viral diseases: Secondary | ICD-10-CM | POA: Diagnosis not present

## 2020-06-12 DIAGNOSIS — Z20828 Contact with and (suspected) exposure to other viral communicable diseases: Secondary | ICD-10-CM | POA: Diagnosis not present

## 2020-06-16 ENCOUNTER — Telehealth: Payer: Self-pay

## 2020-06-16 NOTE — Telephone Encounter (Signed)
(  4:06p) SW returned call to daughter who reported that patient has blisters on her lower leg. Patient received home bealth and the blisters improved, but they are now bust again. Daughter would like to consult with the RN regarding care of this blisters that have reappeared on the back of patient's leg.  RN will be updated for follow-up next week. Daughter, Johnny Bridge verbalized no other concerns.

## 2020-06-19 DIAGNOSIS — Z1159 Encounter for screening for other viral diseases: Secondary | ICD-10-CM | POA: Diagnosis not present

## 2020-06-19 DIAGNOSIS — Z20828 Contact with and (suspected) exposure to other viral communicable diseases: Secondary | ICD-10-CM | POA: Diagnosis not present

## 2020-06-20 DIAGNOSIS — F32A Depression, unspecified: Secondary | ICD-10-CM | POA: Diagnosis not present

## 2020-06-20 DIAGNOSIS — I130 Hypertensive heart and chronic kidney disease with heart failure and stage 1 through stage 4 chronic kidney disease, or unspecified chronic kidney disease: Secondary | ICD-10-CM | POA: Diagnosis not present

## 2020-06-20 DIAGNOSIS — L97211 Non-pressure chronic ulcer of right calf limited to breakdown of skin: Secondary | ICD-10-CM | POA: Diagnosis not present

## 2020-06-20 DIAGNOSIS — K449 Diaphragmatic hernia without obstruction or gangrene: Secondary | ICD-10-CM | POA: Diagnosis not present

## 2020-06-20 DIAGNOSIS — I5042 Chronic combined systolic (congestive) and diastolic (congestive) heart failure: Secondary | ICD-10-CM | POA: Diagnosis not present

## 2020-06-20 DIAGNOSIS — E559 Vitamin D deficiency, unspecified: Secondary | ICD-10-CM | POA: Diagnosis not present

## 2020-06-20 DIAGNOSIS — E640 Sequelae of protein-calorie malnutrition: Secondary | ICD-10-CM | POA: Diagnosis not present

## 2020-06-20 DIAGNOSIS — K219 Gastro-esophageal reflux disease without esophagitis: Secondary | ICD-10-CM | POA: Diagnosis not present

## 2020-06-20 DIAGNOSIS — B965 Pseudomonas (aeruginosa) (mallei) (pseudomallei) as the cause of diseases classified elsewhere: Secondary | ICD-10-CM | POA: Diagnosis not present

## 2020-06-20 DIAGNOSIS — F411 Generalized anxiety disorder: Secondary | ICD-10-CM | POA: Diagnosis not present

## 2020-06-20 DIAGNOSIS — H35033 Hypertensive retinopathy, bilateral: Secondary | ICD-10-CM | POA: Diagnosis not present

## 2020-06-20 DIAGNOSIS — N183 Chronic kidney disease, stage 3 unspecified: Secondary | ICD-10-CM | POA: Diagnosis not present

## 2020-06-20 DIAGNOSIS — J479 Bronchiectasis, uncomplicated: Secondary | ICD-10-CM | POA: Diagnosis not present

## 2020-06-20 DIAGNOSIS — G609 Hereditary and idiopathic neuropathy, unspecified: Secondary | ICD-10-CM | POA: Diagnosis not present

## 2020-06-20 DIAGNOSIS — M1990 Unspecified osteoarthritis, unspecified site: Secondary | ICD-10-CM | POA: Diagnosis not present

## 2020-06-20 DIAGNOSIS — I872 Venous insufficiency (chronic) (peripheral): Secondary | ICD-10-CM | POA: Diagnosis not present

## 2020-06-20 DIAGNOSIS — H9193 Unspecified hearing loss, bilateral: Secondary | ICD-10-CM | POA: Diagnosis not present

## 2020-06-20 DIAGNOSIS — E785 Hyperlipidemia, unspecified: Secondary | ICD-10-CM | POA: Diagnosis not present

## 2020-06-20 DIAGNOSIS — I2510 Atherosclerotic heart disease of native coronary artery without angina pectoris: Secondary | ICD-10-CM | POA: Diagnosis not present

## 2020-06-20 DIAGNOSIS — F339 Major depressive disorder, recurrent, unspecified: Secondary | ICD-10-CM | POA: Diagnosis not present

## 2020-06-20 DIAGNOSIS — I252 Old myocardial infarction: Secondary | ICD-10-CM | POA: Diagnosis not present

## 2020-06-20 DIAGNOSIS — R32 Unspecified urinary incontinence: Secondary | ICD-10-CM | POA: Diagnosis not present

## 2020-06-20 DIAGNOSIS — E039 Hypothyroidism, unspecified: Secondary | ICD-10-CM | POA: Diagnosis not present

## 2020-06-20 DIAGNOSIS — D509 Iron deficiency anemia, unspecified: Secondary | ICD-10-CM | POA: Diagnosis not present

## 2020-06-20 DIAGNOSIS — M8588 Other specified disorders of bone density and structure, other site: Secondary | ICD-10-CM | POA: Diagnosis not present

## 2020-06-21 DIAGNOSIS — I739 Peripheral vascular disease, unspecified: Secondary | ICD-10-CM | POA: Diagnosis not present

## 2020-06-21 DIAGNOSIS — R600 Localized edema: Secondary | ICD-10-CM | POA: Diagnosis not present

## 2020-06-21 DIAGNOSIS — I83019 Varicose veins of right lower extremity with ulcer of unspecified site: Secondary | ICD-10-CM | POA: Diagnosis not present

## 2020-06-22 ENCOUNTER — Non-Acute Institutional Stay: Payer: Medicare Other | Admitting: *Deleted

## 2020-06-22 ENCOUNTER — Other Ambulatory Visit: Payer: Self-pay

## 2020-06-22 ENCOUNTER — Non-Acute Institutional Stay: Payer: Medicare Other

## 2020-06-22 DIAGNOSIS — Z515 Encounter for palliative care: Secondary | ICD-10-CM

## 2020-06-22 NOTE — Progress Notes (Signed)
COMMUNITY PALLIATIVE CARE SW NOTE  PATIENT NAME: Sarah [name] DOB: 1921/06/15 MRN: [id number]  PRIMARY CARE PROVIDER: [name], MD  RESPONSIBLE PARTY:  Acct ID - Guarantor Home Phone Work Phone Relationship Acct Type  [id number] - Minkler,[name] [phone]  Self P/F     [address], [place], [place] [address]     PLAN OF CARE and INTERVENTIONS:             GOALS OF CARE/ ADVANCE CARE PLANNING:  Goals if for patient to remain in her home through the end of life. Patient is a DNR. SOCIAL/EMOTIONAL/SPIRITUAL ASSESSMENT/ INTERVENTIONS: SW completed a face-to-face visit with patient, and included the palliative care RN- M. Howard virtually. Patient reported that she was getting dressed when SW arrived. She quickly got dressed and jointed SW in the living area. Patient denied pain. Patient reported that she has "felt old" lately. She stated that she feels more tired. She discussed her ailments and everything she has been through and stated " I know this can't go on forever". She expressed how lucky she was to have good family support and family members with medical background. She contributes this support to her longevity. The RN completed an assessment on patient, including observing her leg wraps on her right leg. Patient is now receiving home health x3's a week to wrap her right leg, where she has some open blisters. Patient stated that the blisters were on the back of left, then they burst and had blood and liquid coming out. She admitted that the blisters look better since home health started treating her leg. Patient continues to go down for meals. She will be having a visit from a church member today, which she was excited about. Patient's affect appears to be bright and she remains motivated about staying active. She also remains open to palliative care support. SW provided supportive presence, active listening, assessment of patient needs and comfort, observation, coordinated virtual visit  with RN, and reassurance of support.  PATIENT/CAREGIVER EDUCATION/ COPING:  Patient appears to be aware and realistic about her age and health, however she is motivated about staying busy.  PERSONAL EMERGENCY PLAN:  Per facility protocol.  COMMUNITY RESOURCES COORDINATION/ HEALTH CARE NAVIGATION:  Patient is receiving home health through [facility]. FINANCIAL/LEGAL CONCERNS/INTERVENTIONS:  None.     SOCIAL HX:  Social History   Tobacco Use   Smoking status: Never   Smokeless tobacco: Never  Substance Use Topics   Alcohol use: Not Currently    CODE STATUS: DNR ADVANCED DIRECTIVES: Yes MOST FORM COMPLETE:  Yes HOSPICE EDUCATION PROVIDED: No  KGM:[id number] is alert and oriented x3, but admits that she is forgetful. She is ambulating with her wheelchair outside of her apartment and independently inside the apartment. She continues to receive daily personal care and medication reminders.     Duration of visit and documentation: 60 minutes       Clydia Llano, LCSW

## 2020-06-23 DIAGNOSIS — I5042 Chronic combined systolic (congestive) and diastolic (congestive) heart failure: Secondary | ICD-10-CM | POA: Diagnosis not present

## 2020-06-23 DIAGNOSIS — I872 Venous insufficiency (chronic) (peripheral): Secondary | ICD-10-CM | POA: Diagnosis not present

## 2020-06-23 DIAGNOSIS — I130 Hypertensive heart and chronic kidney disease with heart failure and stage 1 through stage 4 chronic kidney disease, or unspecified chronic kidney disease: Secondary | ICD-10-CM | POA: Diagnosis not present

## 2020-06-23 DIAGNOSIS — R32 Unspecified urinary incontinence: Secondary | ICD-10-CM | POA: Diagnosis not present

## 2020-06-23 DIAGNOSIS — L97211 Non-pressure chronic ulcer of right calf limited to breakdown of skin: Secondary | ICD-10-CM | POA: Diagnosis not present

## 2020-06-23 DIAGNOSIS — N183 Chronic kidney disease, stage 3 unspecified: Secondary | ICD-10-CM | POA: Diagnosis not present

## 2020-06-26 DIAGNOSIS — I5042 Chronic combined systolic (congestive) and diastolic (congestive) heart failure: Secondary | ICD-10-CM | POA: Diagnosis not present

## 2020-06-26 DIAGNOSIS — I130 Hypertensive heart and chronic kidney disease with heart failure and stage 1 through stage 4 chronic kidney disease, or unspecified chronic kidney disease: Secondary | ICD-10-CM | POA: Diagnosis not present

## 2020-06-26 DIAGNOSIS — R32 Unspecified urinary incontinence: Secondary | ICD-10-CM | POA: Diagnosis not present

## 2020-06-26 DIAGNOSIS — I872 Venous insufficiency (chronic) (peripheral): Secondary | ICD-10-CM | POA: Diagnosis not present

## 2020-06-26 DIAGNOSIS — Z20828 Contact with and (suspected) exposure to other viral communicable diseases: Secondary | ICD-10-CM | POA: Diagnosis not present

## 2020-06-26 DIAGNOSIS — N183 Chronic kidney disease, stage 3 unspecified: Secondary | ICD-10-CM | POA: Diagnosis not present

## 2020-06-26 DIAGNOSIS — L97211 Non-pressure chronic ulcer of right calf limited to breakdown of skin: Secondary | ICD-10-CM | POA: Diagnosis not present

## 2020-06-26 DIAGNOSIS — Z1159 Encounter for screening for other viral diseases: Secondary | ICD-10-CM | POA: Diagnosis not present

## 2020-07-01 DIAGNOSIS — I130 Hypertensive heart and chronic kidney disease with heart failure and stage 1 through stage 4 chronic kidney disease, or unspecified chronic kidney disease: Secondary | ICD-10-CM | POA: Diagnosis not present

## 2020-07-01 DIAGNOSIS — I872 Venous insufficiency (chronic) (peripheral): Secondary | ICD-10-CM | POA: Diagnosis not present

## 2020-07-01 DIAGNOSIS — L97211 Non-pressure chronic ulcer of right calf limited to breakdown of skin: Secondary | ICD-10-CM | POA: Diagnosis not present

## 2020-07-01 DIAGNOSIS — R32 Unspecified urinary incontinence: Secondary | ICD-10-CM | POA: Diagnosis not present

## 2020-07-01 DIAGNOSIS — N183 Chronic kidney disease, stage 3 unspecified: Secondary | ICD-10-CM | POA: Diagnosis not present

## 2020-07-01 DIAGNOSIS — I5042 Chronic combined systolic (congestive) and diastolic (congestive) heart failure: Secondary | ICD-10-CM | POA: Diagnosis not present

## 2020-07-03 DIAGNOSIS — Z1159 Encounter for screening for other viral diseases: Secondary | ICD-10-CM | POA: Diagnosis not present

## 2020-07-03 DIAGNOSIS — Z20828 Contact with and (suspected) exposure to other viral communicable diseases: Secondary | ICD-10-CM | POA: Diagnosis not present

## 2020-07-04 DIAGNOSIS — I130 Hypertensive heart and chronic kidney disease with heart failure and stage 1 through stage 4 chronic kidney disease, or unspecified chronic kidney disease: Secondary | ICD-10-CM | POA: Diagnosis not present

## 2020-07-04 DIAGNOSIS — I5042 Chronic combined systolic (congestive) and diastolic (congestive) heart failure: Secondary | ICD-10-CM | POA: Diagnosis not present

## 2020-07-04 DIAGNOSIS — I872 Venous insufficiency (chronic) (peripheral): Secondary | ICD-10-CM | POA: Diagnosis not present

## 2020-07-04 DIAGNOSIS — L97211 Non-pressure chronic ulcer of right calf limited to breakdown of skin: Secondary | ICD-10-CM | POA: Diagnosis not present

## 2020-07-04 DIAGNOSIS — R32 Unspecified urinary incontinence: Secondary | ICD-10-CM | POA: Diagnosis not present

## 2020-07-04 DIAGNOSIS — N183 Chronic kidney disease, stage 3 unspecified: Secondary | ICD-10-CM | POA: Diagnosis not present

## 2020-07-05 ENCOUNTER — Encounter: Payer: Self-pay | Admitting: Podiatry

## 2020-07-05 ENCOUNTER — Other Ambulatory Visit: Payer: Self-pay

## 2020-07-05 ENCOUNTER — Ambulatory Visit (INDEPENDENT_AMBULATORY_CARE_PROVIDER_SITE_OTHER): Payer: Medicare Other | Admitting: Podiatry

## 2020-07-05 DIAGNOSIS — Q828 Other specified congenital malformations of skin: Secondary | ICD-10-CM | POA: Diagnosis not present

## 2020-07-05 DIAGNOSIS — M79674 Pain in right toe(s): Secondary | ICD-10-CM

## 2020-07-05 DIAGNOSIS — I739 Peripheral vascular disease, unspecified: Secondary | ICD-10-CM | POA: Diagnosis not present

## 2020-07-05 DIAGNOSIS — B351 Tinea unguium: Secondary | ICD-10-CM

## 2020-07-05 DIAGNOSIS — R3911 Hesitancy of micturition: Secondary | ICD-10-CM | POA: Insufficient documentation

## 2020-07-05 DIAGNOSIS — L84 Corns and callosities: Secondary | ICD-10-CM

## 2020-07-05 DIAGNOSIS — L97909 Non-pressure chronic ulcer of unspecified part of unspecified lower leg with unspecified severity: Secondary | ICD-10-CM | POA: Insufficient documentation

## 2020-07-05 DIAGNOSIS — M79675 Pain in left toe(s): Secondary | ICD-10-CM

## 2020-07-05 DIAGNOSIS — Z96641 Presence of right artificial hip joint: Secondary | ICD-10-CM | POA: Insufficient documentation

## 2020-07-07 DIAGNOSIS — R32 Unspecified urinary incontinence: Secondary | ICD-10-CM | POA: Diagnosis not present

## 2020-07-07 DIAGNOSIS — L97211 Non-pressure chronic ulcer of right calf limited to breakdown of skin: Secondary | ICD-10-CM | POA: Diagnosis not present

## 2020-07-07 DIAGNOSIS — I872 Venous insufficiency (chronic) (peripheral): Secondary | ICD-10-CM | POA: Diagnosis not present

## 2020-07-07 DIAGNOSIS — I130 Hypertensive heart and chronic kidney disease with heart failure and stage 1 through stage 4 chronic kidney disease, or unspecified chronic kidney disease: Secondary | ICD-10-CM | POA: Diagnosis not present

## 2020-07-07 DIAGNOSIS — N183 Chronic kidney disease, stage 3 unspecified: Secondary | ICD-10-CM | POA: Diagnosis not present

## 2020-07-07 DIAGNOSIS — I5042 Chronic combined systolic (congestive) and diastolic (congestive) heart failure: Secondary | ICD-10-CM | POA: Diagnosis not present

## 2020-07-08 NOTE — Progress Notes (Signed)
Subjective:  Patient ID: Sarah [name], female    DOB: May 31, 1921,  MRN: [id number]  Sarah [name] presents to clinic today for for at risk foot care. Patient has h/o PAD and corn(s) b/l feet and painful thick toenails that are difficult to trim. Painful toenails interfere with ambulation. Aggravating factors include wearing enclosed shoe gear. Pain is relieved with periodic professional debridement. Painful corns are aggravated when weightbearing when wearing enclosed shoe gear. Pain is relieved with periodic professional debridement.  Her daughter is present during today's visit.  [name] states she has a leg wrap on RLE due to lymphedema with wound. She states wound is healing.  PCP is Sarah Palmer, MD , and last visit was 06/27/2020.  Allergies  Allergen Reactions   Codeine Anaphylaxis   Hydrocodone Nausea And Vomiting and Other (See Comments)    SYNCOPE AND BRADYCARDIA   Isosorbide Other (See Comments)    BRADYCARDIA   Isosorbide Nitrate Other (See Comments)    BRADYCARDIA   Oxycodone Palpitations    Rapid heart beat   Clarithromycin Nausea And Vomiting    Has patient had a PCN reaction causing immediate rash, facial/tongue/throat swelling, SOB or lightheadedness with hypotension: Unknown Has patient had a PCN reaction causing severe rash involving mucus membranes or skin necrosis: Unknown Has patient had a PCN reaction that required hospitalization: Unknown Has patient had a PCN reaction occurring within the last 10 years: Unknown If all of the above answers are "NO", then may proceed with Cephalosporin use.    Fiorinal [Butalbital-Aspirin-Caffeine] Swelling and Other (See Comments)    Facial swelling    Levofloxacin Nausea And Vomiting and Other (See Comments)    SYNCOPE ALSO   Augmentin [Amoxicillin-Pot Clavulanate] Diarrhea   Doxycycline     Sweats and aches   Tylenol With Codeine #3 [Acetaminophen-Codeine]     Other reaction(s): angioedema    Review of  Systems: Negative except as noted in the HPI. Objective:   Constitutional Sarah [name] is a pleasant 87 y.o. Caucasian female, WD, WN in NAD. AAO x 3.   Vascular Capillary fill time to digits <3 seconds b/l lower extremities. Faintly palpable DP pulse(s) b/l lower extremities. Nonpalpable PT pulse(s) b/l lower extremities. Pedal hair absent. Lower extremity skin temperature gradient warm to cool. No pain with calf compression b/l. Trace edema noted b/l lower extremities. No cyanosis or clubbing noted.  Neurologic Normal speech. Oriented to person, place, and time. Pt has subjective symptoms of neuropathy. Protective sensation intact 5/5 intact bilaterally with 10g monofilament b/l.  Dermatologic Toenails 1-5 b/l elongated, discolored, dystrophic, thickened, crumbly with subungual debris and tenderness to dorsal palpation. Hyperkeratotic lesion(s) R 2nd toe.  No erythema, no edema, no drainage, no fluctuance. Porokeratotic lesion(s) L 4th toe and R 5th toe. No erythema, no edema, no drainage, no fluctuance.  Orthopedic: Normal muscle strength 5/5 to all lower extremity muscle groups bilaterally. No pain crepitus or joint limitation noted with ROM b/l. Hammertoe(s) noted to the 2-5 bilaterally.   Radiographs: None Assessment:   1. Pain due to onychomycosis of toenails of both feet   2. Corns   3. Porokeratosis   4. PAD (peripheral artery disease) (HCC)    Plan:  Patient was evaluated and treated and all questions answered.  Onychomycosis with pain -Nails palliatively debridement as below -Educated on self-care  Procedure: Nail Debridement Rationale: Pain Type of Debridement: manual, sharp debridement. Instrumentation: Nail nipper, rotary burr. Number of Nails: 10 -Examined patient. -Patient to continue soft, supportive shoe gear  daily. -Toenails 1-5 b/l were debrided in length and girth with sterile nail nippers and dremel without iatrogenic bleeding.  -Corn(s) R 5th toe pared  utilizing sterile scalpel blade without complication or incident. Total number debrided=1. -Painful porokeratotic lesion(s) L 4th toe and R 5th toe pared and enucleated with sterile scalpel blade without incident. Total number of lesions debrided=2. -Patient to report any pedal injuries to medical professional immediately. -Patient/POA to call should there be question/concern in the interim.  Return in about 3 months (around 10/05/2020).  Freddie Breech, DPM

## 2020-07-11 DIAGNOSIS — N183 Chronic kidney disease, stage 3 unspecified: Secondary | ICD-10-CM | POA: Diagnosis not present

## 2020-07-11 DIAGNOSIS — I5042 Chronic combined systolic (congestive) and diastolic (congestive) heart failure: Secondary | ICD-10-CM | POA: Diagnosis not present

## 2020-07-11 DIAGNOSIS — I872 Venous insufficiency (chronic) (peripheral): Secondary | ICD-10-CM | POA: Diagnosis not present

## 2020-07-11 DIAGNOSIS — R32 Unspecified urinary incontinence: Secondary | ICD-10-CM | POA: Diagnosis not present

## 2020-07-11 DIAGNOSIS — L97211 Non-pressure chronic ulcer of right calf limited to breakdown of skin: Secondary | ICD-10-CM | POA: Diagnosis not present

## 2020-07-11 DIAGNOSIS — I130 Hypertensive heart and chronic kidney disease with heart failure and stage 1 through stage 4 chronic kidney disease, or unspecified chronic kidney disease: Secondary | ICD-10-CM | POA: Diagnosis not present

## 2020-07-14 DIAGNOSIS — I872 Venous insufficiency (chronic) (peripheral): Secondary | ICD-10-CM | POA: Diagnosis not present

## 2020-07-14 DIAGNOSIS — R32 Unspecified urinary incontinence: Secondary | ICD-10-CM | POA: Diagnosis not present

## 2020-07-14 DIAGNOSIS — I130 Hypertensive heart and chronic kidney disease with heart failure and stage 1 through stage 4 chronic kidney disease, or unspecified chronic kidney disease: Secondary | ICD-10-CM | POA: Diagnosis not present

## 2020-07-14 DIAGNOSIS — I5042 Chronic combined systolic (congestive) and diastolic (congestive) heart failure: Secondary | ICD-10-CM | POA: Diagnosis not present

## 2020-07-14 DIAGNOSIS — N183 Chronic kidney disease, stage 3 unspecified: Secondary | ICD-10-CM | POA: Diagnosis not present

## 2020-07-14 DIAGNOSIS — L97211 Non-pressure chronic ulcer of right calf limited to breakdown of skin: Secondary | ICD-10-CM | POA: Diagnosis not present

## 2020-07-18 DIAGNOSIS — I5042 Chronic combined systolic (congestive) and diastolic (congestive) heart failure: Secondary | ICD-10-CM | POA: Diagnosis not present

## 2020-07-18 DIAGNOSIS — I872 Venous insufficiency (chronic) (peripheral): Secondary | ICD-10-CM | POA: Diagnosis not present

## 2020-07-18 DIAGNOSIS — R32 Unspecified urinary incontinence: Secondary | ICD-10-CM | POA: Diagnosis not present

## 2020-07-18 DIAGNOSIS — I130 Hypertensive heart and chronic kidney disease with heart failure and stage 1 through stage 4 chronic kidney disease, or unspecified chronic kidney disease: Secondary | ICD-10-CM | POA: Diagnosis not present

## 2020-07-18 DIAGNOSIS — L97211 Non-pressure chronic ulcer of right calf limited to breakdown of skin: Secondary | ICD-10-CM | POA: Diagnosis not present

## 2020-07-18 DIAGNOSIS — N183 Chronic kidney disease, stage 3 unspecified: Secondary | ICD-10-CM | POA: Diagnosis not present

## 2020-07-19 ENCOUNTER — Other Ambulatory Visit: Payer: Self-pay

## 2020-07-19 ENCOUNTER — Other Ambulatory Visit: Payer: Medicare Other

## 2020-07-19 DIAGNOSIS — Z515 Encounter for palliative care: Secondary | ICD-10-CM

## 2020-07-19 NOTE — Progress Notes (Signed)
COMMUNITY PALLIATIVE CARE RN NOTE  PATIENT NAME: Sarah [name] DOB: [birth year] MRN: [id number]  PRIMARY CARE PROVIDER: [name], MD  RESPONSIBLE PARTY:  Acct ID - Guarantor Home Phone Work Phone Relationship Acct Type  [id number] Jonetta Osgood* [phone]  Self P/F     [address] [place], [place], [place] [address]   Due to the COVID-19 crisis, this virtual check-in visit was done via telephone from my office and it was initiated and consent by this patient and or family.  PLAN OF CARE and INTERVENTION:  ADVANCE CARE PLANNING/GOALS OF CARE: Goal is for patient to remain in her [place] and avoid hospitalizations. PATIENT/CAREGIVER EDUCATION: Symptom management, edema management DISEASE STATUS: Palliative care MSW, Clydia Llano, currently visiting patient and conferenced me in for a video visit. Patient has been having issues with blisters on the back of her left leg, that have since bursted open draining serosanguineous fluid. I was able to assess her left leg and she says it has improved. She is now receiving home health services 3 times per week to treat her legs. Her right leg is currently wrapped as she has some open blisters to this leg. She denies any pain or discomfort. She tries to keep her legs elevated as much as possible. She remains ambulatory. She does experience some shortness of breath at times with exertion. She continues with her nebulizer treatments as prescribed. Her appetite has decreased, but she does try to eat something at each meal. Will continue to provide ongoing education and support.   HISTORY OF PRESENT ILLNESS:  Patient is a 87 year old female who resides at [facility].  Patient continues to be followed by palliative care team and seen monthly and PRN.  CODE STATUS: DNR ADVANCED DIRECTIVES: Y MOST FORM: no PPS: 50%   (Duration of visit and documentation 20 minutes)   Candiss Norse, RN BSN

## 2020-07-20 DIAGNOSIS — K219 Gastro-esophageal reflux disease without esophagitis: Secondary | ICD-10-CM | POA: Diagnosis not present

## 2020-07-20 DIAGNOSIS — E039 Hypothyroidism, unspecified: Secondary | ICD-10-CM | POA: Diagnosis not present

## 2020-07-20 DIAGNOSIS — B965 Pseudomonas (aeruginosa) (mallei) (pseudomallei) as the cause of diseases classified elsewhere: Secondary | ICD-10-CM | POA: Diagnosis not present

## 2020-07-20 DIAGNOSIS — E640 Sequelae of protein-calorie malnutrition: Secondary | ICD-10-CM | POA: Diagnosis not present

## 2020-07-20 DIAGNOSIS — D509 Iron deficiency anemia, unspecified: Secondary | ICD-10-CM | POA: Diagnosis not present

## 2020-07-20 DIAGNOSIS — I130 Hypertensive heart and chronic kidney disease with heart failure and stage 1 through stage 4 chronic kidney disease, or unspecified chronic kidney disease: Secondary | ICD-10-CM | POA: Diagnosis not present

## 2020-07-20 DIAGNOSIS — I5042 Chronic combined systolic (congestive) and diastolic (congestive) heart failure: Secondary | ICD-10-CM | POA: Diagnosis not present

## 2020-07-20 DIAGNOSIS — F32A Depression, unspecified: Secondary | ICD-10-CM | POA: Diagnosis not present

## 2020-07-20 DIAGNOSIS — E559 Vitamin D deficiency, unspecified: Secondary | ICD-10-CM | POA: Diagnosis not present

## 2020-07-20 DIAGNOSIS — I872 Venous insufficiency (chronic) (peripheral): Secondary | ICD-10-CM | POA: Diagnosis not present

## 2020-07-20 DIAGNOSIS — K449 Diaphragmatic hernia without obstruction or gangrene: Secondary | ICD-10-CM | POA: Diagnosis not present

## 2020-07-20 DIAGNOSIS — N183 Chronic kidney disease, stage 3 unspecified: Secondary | ICD-10-CM | POA: Diagnosis not present

## 2020-07-20 DIAGNOSIS — F411 Generalized anxiety disorder: Secondary | ICD-10-CM | POA: Diagnosis not present

## 2020-07-20 DIAGNOSIS — L97211 Non-pressure chronic ulcer of right calf limited to breakdown of skin: Secondary | ICD-10-CM | POA: Diagnosis not present

## 2020-07-20 DIAGNOSIS — M1990 Unspecified osteoarthritis, unspecified site: Secondary | ICD-10-CM | POA: Diagnosis not present

## 2020-07-20 DIAGNOSIS — J479 Bronchiectasis, uncomplicated: Secondary | ICD-10-CM | POA: Diagnosis not present

## 2020-07-20 DIAGNOSIS — H9193 Unspecified hearing loss, bilateral: Secondary | ICD-10-CM | POA: Diagnosis not present

## 2020-07-20 DIAGNOSIS — G609 Hereditary and idiopathic neuropathy, unspecified: Secondary | ICD-10-CM | POA: Diagnosis not present

## 2020-07-20 DIAGNOSIS — E785 Hyperlipidemia, unspecified: Secondary | ICD-10-CM | POA: Diagnosis not present

## 2020-07-20 DIAGNOSIS — H35033 Hypertensive retinopathy, bilateral: Secondary | ICD-10-CM | POA: Diagnosis not present

## 2020-07-20 DIAGNOSIS — F339 Major depressive disorder, recurrent, unspecified: Secondary | ICD-10-CM | POA: Diagnosis not present

## 2020-07-20 DIAGNOSIS — I2510 Atherosclerotic heart disease of native coronary artery without angina pectoris: Secondary | ICD-10-CM | POA: Diagnosis not present

## 2020-07-20 DIAGNOSIS — I252 Old myocardial infarction: Secondary | ICD-10-CM | POA: Diagnosis not present

## 2020-07-20 DIAGNOSIS — R32 Unspecified urinary incontinence: Secondary | ICD-10-CM | POA: Diagnosis not present

## 2020-07-20 DIAGNOSIS — M8588 Other specified disorders of bone density and structure, other site: Secondary | ICD-10-CM | POA: Diagnosis not present

## 2020-07-21 DIAGNOSIS — I5042 Chronic combined systolic (congestive) and diastolic (congestive) heart failure: Secondary | ICD-10-CM | POA: Diagnosis not present

## 2020-07-21 DIAGNOSIS — L97211 Non-pressure chronic ulcer of right calf limited to breakdown of skin: Secondary | ICD-10-CM | POA: Diagnosis not present

## 2020-07-21 DIAGNOSIS — N183 Chronic kidney disease, stage 3 unspecified: Secondary | ICD-10-CM | POA: Diagnosis not present

## 2020-07-21 DIAGNOSIS — I130 Hypertensive heart and chronic kidney disease with heart failure and stage 1 through stage 4 chronic kidney disease, or unspecified chronic kidney disease: Secondary | ICD-10-CM | POA: Diagnosis not present

## 2020-07-21 DIAGNOSIS — I872 Venous insufficiency (chronic) (peripheral): Secondary | ICD-10-CM | POA: Diagnosis not present

## 2020-07-21 DIAGNOSIS — R32 Unspecified urinary incontinence: Secondary | ICD-10-CM | POA: Diagnosis not present

## 2020-07-24 DIAGNOSIS — Z20828 Contact with and (suspected) exposure to other viral communicable diseases: Secondary | ICD-10-CM | POA: Diagnosis not present

## 2020-07-24 DIAGNOSIS — Z1159 Encounter for screening for other viral diseases: Secondary | ICD-10-CM | POA: Diagnosis not present

## 2020-07-26 DIAGNOSIS — L97211 Non-pressure chronic ulcer of right calf limited to breakdown of skin: Secondary | ICD-10-CM | POA: Diagnosis not present

## 2020-07-26 DIAGNOSIS — I872 Venous insufficiency (chronic) (peripheral): Secondary | ICD-10-CM | POA: Diagnosis not present

## 2020-07-26 DIAGNOSIS — I5042 Chronic combined systolic (congestive) and diastolic (congestive) heart failure: Secondary | ICD-10-CM | POA: Diagnosis not present

## 2020-07-26 DIAGNOSIS — N183 Chronic kidney disease, stage 3 unspecified: Secondary | ICD-10-CM | POA: Diagnosis not present

## 2020-07-26 DIAGNOSIS — R32 Unspecified urinary incontinence: Secondary | ICD-10-CM | POA: Diagnosis not present

## 2020-07-26 DIAGNOSIS — I130 Hypertensive heart and chronic kidney disease with heart failure and stage 1 through stage 4 chronic kidney disease, or unspecified chronic kidney disease: Secondary | ICD-10-CM | POA: Diagnosis not present

## 2020-07-28 DIAGNOSIS — Z20822 Contact with and (suspected) exposure to covid-19: Secondary | ICD-10-CM | POA: Diagnosis not present

## 2020-07-31 DIAGNOSIS — Z20828 Contact with and (suspected) exposure to other viral communicable diseases: Secondary | ICD-10-CM | POA: Diagnosis not present

## 2020-07-31 DIAGNOSIS — Z1159 Encounter for screening for other viral diseases: Secondary | ICD-10-CM | POA: Diagnosis not present

## 2020-08-01 ENCOUNTER — Ambulatory Visit: Payer: Medicare Other | Admitting: Podiatry

## 2020-08-02 DIAGNOSIS — I5042 Chronic combined systolic (congestive) and diastolic (congestive) heart failure: Secondary | ICD-10-CM | POA: Diagnosis not present

## 2020-08-02 DIAGNOSIS — I872 Venous insufficiency (chronic) (peripheral): Secondary | ICD-10-CM | POA: Diagnosis not present

## 2020-08-02 DIAGNOSIS — N183 Chronic kidney disease, stage 3 unspecified: Secondary | ICD-10-CM | POA: Diagnosis not present

## 2020-08-02 DIAGNOSIS — L97211 Non-pressure chronic ulcer of right calf limited to breakdown of skin: Secondary | ICD-10-CM | POA: Diagnosis not present

## 2020-08-02 DIAGNOSIS — R32 Unspecified urinary incontinence: Secondary | ICD-10-CM | POA: Diagnosis not present

## 2020-08-02 DIAGNOSIS — I130 Hypertensive heart and chronic kidney disease with heart failure and stage 1 through stage 4 chronic kidney disease, or unspecified chronic kidney disease: Secondary | ICD-10-CM | POA: Diagnosis not present

## 2020-08-08 DIAGNOSIS — L97211 Non-pressure chronic ulcer of right calf limited to breakdown of skin: Secondary | ICD-10-CM | POA: Diagnosis not present

## 2020-08-08 DIAGNOSIS — I130 Hypertensive heart and chronic kidney disease with heart failure and stage 1 through stage 4 chronic kidney disease, or unspecified chronic kidney disease: Secondary | ICD-10-CM | POA: Diagnosis not present

## 2020-08-08 DIAGNOSIS — I872 Venous insufficiency (chronic) (peripheral): Secondary | ICD-10-CM | POA: Diagnosis not present

## 2020-08-08 DIAGNOSIS — N183 Chronic kidney disease, stage 3 unspecified: Secondary | ICD-10-CM | POA: Diagnosis not present

## 2020-08-08 DIAGNOSIS — I5042 Chronic combined systolic (congestive) and diastolic (congestive) heart failure: Secondary | ICD-10-CM | POA: Diagnosis not present

## 2020-08-08 DIAGNOSIS — R32 Unspecified urinary incontinence: Secondary | ICD-10-CM | POA: Diagnosis not present

## 2020-08-11 NOTE — Progress Notes (Signed)
COMMUNITY PALLIATIVE CARE SW NOTE  PATIENT NAME: Sarah [name] DOB: 14-Jun-1921 MRN: [id number]  PRIMARY CARE PROVIDER: [name], MD  RESPONSIBLE PARTY:  Acct ID - Guarantor Home Phone Work Phone Relationship Acct Type  [id number] - Arney,[name] [phone]  Self P/F     [address], [place], [place] [address]     PLAN OF CARE and INTERVENTIONS:             GOALS OF CARE/ ADVANCE CARE PLANNING:  Goal is for patient to reman in her apartment. Patient is a DNR. SOCIAL/EMOTIONAL/SPIRITUAL ASSESSMENT/ INTERVENTIONS:  SW completed a visit with patient at her apartment. Patient was still in bed asleep, which was unusual. Patient aroused to verbal prompts, but appeared to be disoriented  for the first few minutes. Patient stated that she had not realized that she overslept. Patient reported that she is just not herself. She showed SW that her leg was healing, but she just feels "old". Patient shared that she did not feel she has bounced back as quickly as with previous decline in her health. She states "I feel that I might not have much time left". SW provided active listening and support to her as well as probed her as to feelings about the decline. Patient began to get herself up  and prepare for the day and lunch. SW advised patient that she will return for another visit and encouraged her to call with any questions or concerns. PATIENT/CAREGIVER EDUCATION/ COPING:  Patient appears to be coping adequately. PERSONAL EMERGENCY PLAN:  Per facility protocol.  COMMUNITY RESOURCES COORDINATION/ HEALTH CARE NAVIGATION:  Patient is receives housekeeping assistance privately. FINANCIAL/LEGAL CONCERNS/INTERVENTIONS:  NOne.     SOCIAL HX:  Social History   Tobacco Use   Smoking status: Never   Smokeless tobacco: Never  Substance Use Topics   Alcohol use: Not Currently    CODE STATUS: DNR ADVANCED DIRECTIVES: Yes MOST FORM COMPLETE: Yes HOSPICE EDUCATION PROVIDED: No  PPS: Patient is  alert and oriented x3, but admits that she is forgetful. She is ambulating with her wheelchair outside of her apartment and independently inside the apartment. She continues to receive daily personal care and medication reminders.     Duration of visit and documentation: 60 minutes    Clydia Llano, LCSW

## 2020-08-14 DIAGNOSIS — Z1159 Encounter for screening for other viral diseases: Secondary | ICD-10-CM | POA: Diagnosis not present

## 2020-08-14 DIAGNOSIS — Z20828 Contact with and (suspected) exposure to other viral communicable diseases: Secondary | ICD-10-CM | POA: Diagnosis not present

## 2020-09-04 DIAGNOSIS — J479 Bronchiectasis, uncomplicated: Secondary | ICD-10-CM

## 2020-09-05 NOTE — Telephone Encounter (Signed)
Thank you done!

## 2020-09-05 NOTE — Telephone Encounter (Signed)
Mychart message sent by pt's daughter Sharyl Nimrod stating that pt's current nebulizer machine had stopped working. New order has been placed for pt to receive a new nebulizer machine from [facility]. Sending to Dr. Delton Coombes as an FYI so the order can be signed off.

## 2020-09-15 ENCOUNTER — Other Ambulatory Visit: Payer: Self-pay | Admitting: Emergency Medicine

## 2020-09-18 DIAGNOSIS — Z8616 Personal history of COVID-19: Secondary | ICD-10-CM | POA: Diagnosis not present

## 2020-09-21 DIAGNOSIS — Z23 Encounter for immunization: Secondary | ICD-10-CM | POA: Diagnosis not present

## 2020-09-25 ENCOUNTER — Other Ambulatory Visit: Payer: Self-pay

## 2020-09-25 ENCOUNTER — Other Ambulatory Visit: Payer: Medicare Other

## 2020-09-25 DIAGNOSIS — Z515 Encounter for palliative care: Secondary | ICD-10-CM

## 2020-09-25 DIAGNOSIS — Z20828 Contact with and (suspected) exposure to other viral communicable diseases: Secondary | ICD-10-CM | POA: Diagnosis not present

## 2020-09-25 NOTE — Progress Notes (Signed)
COMMUNITY PALLIATIVE CARE SW NOTE  PATIENT NAME: Sarah [name] DOB: 07-23-1921 MRN: [id number]  PRIMARY CARE PROVIDER: [name], MD  RESPONSIBLE PARTY:  Acct ID - Guarantor Home Phone Work Phone Relationship Acct Type  [id number] - Kassis,[name] [phone]  Self P/F     [address], [place], [place] [address]     PLAN OF CARE and INTERVENTIONS:             GOALS OF CARE/ ADVANCE CARE PLANNING:  Goal is for patient to remain in her independent apartment. Patient is a DNR.  SOCIAL/EMOTIONAL/SPIRITUAL ASSESSMENT/ INTERVENTIONS:  SW completed telephonic visit check-in with patient's daughter Corrie Dandy, who was present with patient. Corrie Dandy reported that patient is back at her baseline. She completed therapy successfully. The wounds on her legs have also healed and she is no longer receiving home health services. Patient continues to go down for meals daily. She remains relatively independent for ADL's. Her daughter continue to visit with her regularly. SW advised that she will visit patient next month, but encouraged them to call with any concerns or changes in patient's condition. Patient and PCG remain open to ongoing palliative care support.  PATIENT/CAREGIVER EDUCATION/ COPING:  No coping issues presented. PERSONAL EMERGENCY PLAN:  Per facility protocol. COMMUNITY RESOURCES COORDINATION/ HEALTH CARE NAVIGATION:  Patient receives private housekeeping support.  FINANCIAL/LEGAL CONCERNS/INTERVENTIONS:  None.     SOCIAL HX:  Social History   Tobacco Use   Smoking status: Never   Smokeless tobacco: Never  Substance Use Topics   Alcohol use: Not Currently    CODE STATUS: DNR ADVANCED DIRECTIVES: Yes MOST FORM COMPLETE:  Yes HOSPICE EDUCATION PROVIDED: No  PPS: Patient is alert and oriented x3 and is forgetful. She is ambulating with her wheelchair outside of her apartment and independently inside the apartment. She continues to receive daily personal care and medication reminders.      Duration of telephonic visit and documentation: 30 minutes   Clydia Llano, LCSW

## 2020-10-09 DIAGNOSIS — Z20828 Contact with and (suspected) exposure to other viral communicable diseases: Secondary | ICD-10-CM | POA: Diagnosis not present

## 2020-10-10 DIAGNOSIS — S72031D Displaced midcervical fracture of right femur, subsequent encounter for closed fracture with routine healing: Secondary | ICD-10-CM | POA: Diagnosis not present

## 2020-10-12 DIAGNOSIS — H401122 Primary open-angle glaucoma, left eye, moderate stage: Secondary | ICD-10-CM | POA: Diagnosis not present

## 2020-10-13 ENCOUNTER — Ambulatory Visit (INDEPENDENT_AMBULATORY_CARE_PROVIDER_SITE_OTHER): Payer: Medicare Other | Admitting: Emergency Medicine

## 2020-10-13 ENCOUNTER — Other Ambulatory Visit: Payer: Self-pay

## 2020-10-13 ENCOUNTER — Encounter: Payer: Self-pay | Admitting: Emergency Medicine

## 2020-10-13 VITALS — BP 140/72 | HR 76 | Temp 97.6°F | Ht 64.0 in | Wt 112.8 lb

## 2020-10-13 DIAGNOSIS — J479 Bronchiectasis, uncomplicated: Secondary | ICD-10-CM | POA: Diagnosis not present

## 2020-10-13 DIAGNOSIS — Z23 Encounter for immunization: Secondary | ICD-10-CM | POA: Diagnosis not present

## 2020-10-13 MED ORDER — AZITHROMYCIN 250 MG PO TABS: 250.0000 mg | ORAL_TABLET | Freq: Every day | ORAL | 5 refills | Status: DC

## 2020-10-13 NOTE — Addendum Note (Signed)
Addended by: Dorisann Frames R on: 10/13/2020 02:06 PM   Modules accepted: Orders

## 2020-10-13 NOTE — Patient Instructions (Signed)
We will stop the rotating antibiotics. We will continue azithromycin 250 mg once daily every day. Continue prednisone 10 mg every day. Use your albuterol nebulizer twice a day to help you clear your mucus. Use your chest vest to help clear mucus, try to use it every day. COVID-19 vaccine is up-to-date Flu shot today Follow with Dr Jerret Mcbane in 3 months or sooner if you have any problems. 

## 2020-10-13 NOTE — Progress Notes (Signed)
   Subjective:    Patient ID: Sarah [name], female    DOB: 1922-01-08, 87 y.o.   MRN: [id number]  HPI  ROV 05/08/20 --87 year old woman whom I have followed for chronic cough and asthmatic bronchitis in the setting of rhinitis, GERD and significant bronchiectasis.  She is colonized with Pseudomonas.  She has been managed on rotating antibiotics (azithromycin and Omnicef), chronic prednisone 10 mg daily, scheduled albuterol nebs with chest vest for secretion clearance. She is been doing physical therapy following a fall and hip fracture 01/2020  She reports > she has been doing well, has surpassed expectations with PT/ OT. Her breathing is stable. She is clearing mucous, often daily. Taking her albuterol bid. She is not using her chest vest every day.  She has noted some rash on her scalp, some blurred vision, HA. She has been on pred 10mg  for several years, is concerned about these side effects.   Last CT scan of the chest was 07/02/2019.  Most recent chest x-ray 01/25/2020  ROV 10/13/20 --follow-up visit for Sarah [name].  She is 38 and has a history of significant bronchiectasis, pseudomonal colonization.  Chronic cough due to this as well as her upper airway irritation from rhinitis, GERD.  She may also have a component of asthmatic bronchitis, has benefited from chronic prednisone 10 mg and albuterol.  She rotates azithromycin and Omnicef the beginning of each month. She had more mucous to clear over the Summer. Currently doing ok. She uses chest vest some, but not every day. She has had a few episode when she has waked up in the middle of the night to cough. Doing albuterol 2x a day.     Review of Systems As above      Objective:   Physical Exam  Vitals:   10/13/20 1333  BP: 140/72  Pulse: 76  Temp: 97.6 F (36.4 C)  TempSrc: Oral  SpO2: 91%  Weight: 112 lb 12.8 oz (51.2 kg)  Height: 5\' 4"  (1.626 m)    GEN: A/Ox3, a bit anxious, NAD, elderly and thin.    HEENT:  OP  clear  NECK: no stridor  RESP distant, B insp rhonchi, insp squeak B upper zones  CARD:  Regular, no M  Musco: some chronic venous stasis, bilateral ankle edema.   Neuro: awake, poor hearing, oriented       Assessment & Plan:  BRONCHIECTASIS We will stop the rotating antibiotics. We will continue azithromycin 250 mg once daily every day. Continue prednisone 10 mg every day. Use your albuterol nebulizer twice a day to help you clear your mucus. Use your chest vest to help clear mucus, try to use it every day. COVID-19 vaccine is up-to-date Flu shot today Follow with Dr 10/15/20 in 3 months or sooner if you have any problems.  , MD, PhD 10/13/2020, 1:49 PM [facility] [phone] or if no answer [phone]

## 2020-10-13 NOTE — Assessment & Plan Note (Signed)
We will stop the rotating antibiotics. We will continue azithromycin 250 mg once daily every day. Continue prednisone 10 mg every day. Use your albuterol nebulizer twice a day to help you clear your mucus. Use your chest vest to help clear mucus, try to use it every day. COVID-19 vaccine is up-to-date Flu shot today Follow with Dr Delton Coombes in 3 months or sooner if you have any problems.

## 2020-10-16 DIAGNOSIS — Z8616 Personal history of COVID-19: Secondary | ICD-10-CM | POA: Diagnosis not present

## 2020-10-17 ENCOUNTER — Ambulatory Visit (INDEPENDENT_AMBULATORY_CARE_PROVIDER_SITE_OTHER): Payer: Medicare Other | Admitting: Podiatry

## 2020-10-17 ENCOUNTER — Other Ambulatory Visit: Payer: Self-pay

## 2020-10-17 ENCOUNTER — Encounter: Payer: Self-pay | Admitting: Podiatry

## 2020-10-17 DIAGNOSIS — M79675 Pain in left toe(s): Secondary | ICD-10-CM | POA: Diagnosis not present

## 2020-10-17 DIAGNOSIS — Q828 Other specified congenital malformations of skin: Secondary | ICD-10-CM

## 2020-10-17 DIAGNOSIS — B351 Tinea unguium: Secondary | ICD-10-CM

## 2020-10-17 DIAGNOSIS — I739 Peripheral vascular disease, unspecified: Secondary | ICD-10-CM | POA: Diagnosis not present

## 2020-10-17 DIAGNOSIS — M79674 Pain in right toe(s): Secondary | ICD-10-CM | POA: Diagnosis not present

## 2020-10-17 DIAGNOSIS — L84 Corns and callosities: Secondary | ICD-10-CM | POA: Diagnosis not present

## 2020-10-22 NOTE — Progress Notes (Signed)
Subjective:  Patient ID: Sarah [name], female    DOB: Mar 10, 1921,  MRN: [id number]  Sarah [name] presents to clinic today for for at risk foot care. Patient has h/o PAD and corn(s) bilateral feet and painful thick toenails that are difficult to trim. Painful toenails interfere with ambulation. Aggravating factors include wearing enclosed shoe gear. Pain is relieved with periodic professional debridement. Painful corns are aggravated when weightbearing when wearing enclosed shoe gear. Pain is relieved with periodic professional debridement.  She is accompanied by her caregiver, KP, on today's visit. Ms. Natt states KP will be having knee surgery soon. She voices no new pedal concerns on today's visit.  PCP is [name], MD , and last visit was 06/21/2020.  Allergies  Allergen Reactions   Codeine Anaphylaxis   Hydrocodone Nausea And Vomiting and Other (See Comments)    SYNCOPE AND BRADYCARDIA   Isosorbide Other (See Comments)    BRADYCARDIA   Isosorbide Nitrate Other (See Comments)    BRADYCARDIA   Oxycodone Palpitations    Rapid heart beat   Clarithromycin Nausea And Vomiting    Has patient had a PCN reaction causing immediate rash, facial/tongue/throat swelling, SOB or lightheadedness with hypotension: Unknown Has patient had a PCN reaction causing severe rash involving mucus membranes or skin necrosis: Unknown Has patient had a PCN reaction that required hospitalization: Unknown Has patient had a PCN reaction occurring within the last 10 years: Unknown If all of the above answers are "NO", then may proceed with Cephalosporin use.    Fiorinal [Butalbital-Aspirin-Caffeine] Swelling and Other (See Comments)    Facial swelling    Levofloxacin Nausea And Vomiting and Other (See Comments)    SYNCOPE ALSO   Augmentin [Amoxicillin-Pot Clavulanate] Diarrhea   Doxycycline     Sweats and aches   Hydrocodone-Acetaminophen    Tylenol With Codeine #3 [Acetaminophen-Codeine]      Other reaction(s): angioedema    Review of Systems: Negative except as noted in the HPI. Objective:   Constitutional Sarah [name] is a pleasant 87 y.o. Caucasian female, thin build in NAD. AAO x 3.   Vascular Capillary fill time to digits <3 seconds b/l lower extremities. Faintly palpable DP pulse(s) b/l lower extremities. Nonpalpable PT pulse(s) b/l lower extremities. Pedal hair absent. Lower extremity skin temperature gradient warm to cool. No pain with calf compression b/l. Trace edema noted b/l lower extremities. No cyanosis or clubbing noted.  Neurologic Normal speech. Oriented to person, place, and time. Pt has subjective symptoms of neuropathy. Protective sensation intact 5/5 intact bilaterally with 10g monofilament b/l.  Dermatologic Pedal skin is thin shiny, atrophic b/l lower extremities. No open wounds b/l lower extremities. No interdigital macerations b/l lower extremities. Toenails 1-5 b/l elongated, discolored, dystrophic, thickened, crumbly with subungual debris and tenderness to dorsal palpation. Hyperkeratotic lesion(s) R 2nd toe.  No erythema, no edema, no drainage, no fluctuance. Porokeratotic lesion(s) L 4th toe and R 5th toe. No erythema, no edema, no drainage, no fluctuance.  Orthopedic: Normal muscle strength 5/5 to all lower extremity muscle groups bilaterally. Severe hammertoe deformity noted 2-5 bilaterally.   Radiographs: None Assessment:   1. Pain due to onychomycosis of toenails of both feet   2. Corns   3. Porokeratosis   4. PAD (peripheral artery disease) (HCC)    Plan:  Patient was evaluated and treated and all questions answered. Consent given for treatment as described below: -No new findings. No new orders. -Patient to continue soft, supportive shoe gear daily. -Toenails 1-5 b/l were debrided  in length and girth with sterile nail nippers and dremel without iatrogenic bleeding.  -Corn(s) R 2nd toe pared utilizing sterile scalpel blade without complication  or incident. Total number debrided=1. -Painful porokeratotic lesion(s) L 4th toe and R 5th toe pared and enucleated with sterile scalpel blade without incident. Total number of lesions debrided=2. -Patient to report any pedal injuries to medical professional immediately. -Patient/POA to call should there be question/concern in the interim.  Return in about 3 months (around 01/17/2021).  Freddie Breech, DPM

## 2020-10-27 DIAGNOSIS — F410 Panic disorder [episodic paroxysmal anxiety] without agoraphobia: Secondary | ICD-10-CM | POA: Diagnosis not present

## 2020-10-27 DIAGNOSIS — I2510 Atherosclerotic heart disease of native coronary artery without angina pectoris: Secondary | ICD-10-CM | POA: Diagnosis not present

## 2020-10-27 DIAGNOSIS — I83019 Varicose veins of right lower extremity with ulcer of unspecified site: Secondary | ICD-10-CM | POA: Diagnosis not present

## 2020-10-27 DIAGNOSIS — F339 Major depressive disorder, recurrent, unspecified: Secondary | ICD-10-CM | POA: Diagnosis not present

## 2020-10-27 DIAGNOSIS — E559 Vitamin D deficiency, unspecified: Secondary | ICD-10-CM | POA: Diagnosis not present

## 2020-10-27 DIAGNOSIS — I739 Peripheral vascular disease, unspecified: Secondary | ICD-10-CM | POA: Diagnosis not present

## 2020-10-27 DIAGNOSIS — N183 Chronic kidney disease, stage 3 unspecified: Secondary | ICD-10-CM | POA: Diagnosis not present

## 2020-10-27 DIAGNOSIS — E039 Hypothyroidism, unspecified: Secondary | ICD-10-CM | POA: Diagnosis not present

## 2020-10-27 DIAGNOSIS — D509 Iron deficiency anemia, unspecified: Secondary | ICD-10-CM | POA: Diagnosis not present

## 2020-10-27 DIAGNOSIS — Z79899 Other long term (current) drug therapy: Secondary | ICD-10-CM | POA: Diagnosis not present

## 2020-10-27 DIAGNOSIS — R7301 Impaired fasting glucose: Secondary | ICD-10-CM | POA: Diagnosis not present

## 2020-10-27 DIAGNOSIS — E46 Unspecified protein-calorie malnutrition: Secondary | ICD-10-CM | POA: Diagnosis not present

## 2020-10-27 DIAGNOSIS — R54 Age-related physical debility: Secondary | ICD-10-CM | POA: Diagnosis not present

## 2020-10-30 DIAGNOSIS — Z8616 Personal history of COVID-19: Secondary | ICD-10-CM | POA: Diagnosis not present

## 2020-11-02 ENCOUNTER — Other Ambulatory Visit: Payer: Self-pay | Admitting: Emergency Medicine

## 2020-11-02 DIAGNOSIS — R21 Rash and other nonspecific skin eruption: Secondary | ICD-10-CM | POA: Diagnosis not present

## 2020-11-06 DIAGNOSIS — Z8616 Personal history of COVID-19: Secondary | ICD-10-CM | POA: Diagnosis not present

## 2020-11-13 DIAGNOSIS — Z8616 Personal history of COVID-19: Secondary | ICD-10-CM | POA: Diagnosis not present

## 2020-11-19 ENCOUNTER — Other Ambulatory Visit: Payer: Self-pay | Admitting: Emergency Medicine

## 2020-12-04 DIAGNOSIS — Z20822 Contact with and (suspected) exposure to covid-19: Secondary | ICD-10-CM | POA: Diagnosis not present

## 2020-12-07 ENCOUNTER — Other Ambulatory Visit: Payer: Self-pay | Admitting: Emergency Medicine

## 2020-12-11 DIAGNOSIS — Z20828 Contact with and (suspected) exposure to other viral communicable diseases: Secondary | ICD-10-CM | POA: Diagnosis not present

## 2020-12-11 DIAGNOSIS — Z1159 Encounter for screening for other viral diseases: Secondary | ICD-10-CM | POA: Diagnosis not present

## 2020-12-18 DIAGNOSIS — Z20828 Contact with and (suspected) exposure to other viral communicable diseases: Secondary | ICD-10-CM | POA: Diagnosis not present

## 2020-12-18 DIAGNOSIS — Z1159 Encounter for screening for other viral diseases: Secondary | ICD-10-CM | POA: Diagnosis not present

## 2020-12-21 DIAGNOSIS — Z20822 Contact with and (suspected) exposure to covid-19: Secondary | ICD-10-CM | POA: Diagnosis not present

## 2020-12-24 ENCOUNTER — Other Ambulatory Visit: Payer: Self-pay | Admitting: Emergency Medicine

## 2020-12-25 DIAGNOSIS — Z20828 Contact with and (suspected) exposure to other viral communicable diseases: Secondary | ICD-10-CM | POA: Diagnosis not present

## 2020-12-25 DIAGNOSIS — Z1159 Encounter for screening for other viral diseases: Secondary | ICD-10-CM | POA: Diagnosis not present

## 2020-12-27 ENCOUNTER — Other Ambulatory Visit: Payer: Self-pay | Admitting: Emergency Medicine

## 2020-12-27 DIAGNOSIS — Z1159 Encounter for screening for other viral diseases: Secondary | ICD-10-CM | POA: Diagnosis not present

## 2020-12-27 DIAGNOSIS — Z20828 Contact with and (suspected) exposure to other viral communicable diseases: Secondary | ICD-10-CM | POA: Diagnosis not present

## 2020-12-28 ENCOUNTER — Telehealth: Payer: Self-pay | Admitting: Emergency Medicine

## 2020-12-28 MED ORDER — ALBUTEROL SULFATE (2.5 MG/3ML) 0.083% IN NEBU: INHALATION_SOLUTION | RESPIRATORY_TRACT | 25 refills | Status: DC

## 2020-12-28 NOTE — Telephone Encounter (Signed)
I called the patient daughter and she voices understanding that a refill of the nebulizer is at the pharmacy. Nothing further needed.

## 2020-12-29 DIAGNOSIS — Z1159 Encounter for screening for other viral diseases: Secondary | ICD-10-CM | POA: Diagnosis not present

## 2020-12-29 DIAGNOSIS — Z20828 Contact with and (suspected) exposure to other viral communicable diseases: Secondary | ICD-10-CM | POA: Diagnosis not present

## 2021-01-01 DIAGNOSIS — Z1159 Encounter for screening for other viral diseases: Secondary | ICD-10-CM | POA: Diagnosis not present

## 2021-01-01 DIAGNOSIS — Z20828 Contact with and (suspected) exposure to other viral communicable diseases: Secondary | ICD-10-CM | POA: Diagnosis not present

## 2021-01-03 DIAGNOSIS — Z1159 Encounter for screening for other viral diseases: Secondary | ICD-10-CM | POA: Diagnosis not present

## 2021-01-03 DIAGNOSIS — Z20828 Contact with and (suspected) exposure to other viral communicable diseases: Secondary | ICD-10-CM | POA: Diagnosis not present

## 2021-01-04 ENCOUNTER — Telehealth: Payer: Self-pay | Admitting: Nurse Practitioner

## 2021-01-04 ENCOUNTER — Ambulatory Visit (INDEPENDENT_AMBULATORY_CARE_PROVIDER_SITE_OTHER): Payer: Medicare Other | Admitting: Nurse Practitioner

## 2021-01-04 ENCOUNTER — Ambulatory Visit (INDEPENDENT_AMBULATORY_CARE_PROVIDER_SITE_OTHER): Payer: Medicare Other

## 2021-01-04 ENCOUNTER — Encounter: Payer: Self-pay | Admitting: Nurse Practitioner

## 2021-01-04 ENCOUNTER — Other Ambulatory Visit: Payer: Self-pay

## 2021-01-04 VITALS — BP 102/86 | HR 95 | Temp 97.6°F | Ht 64.0 in | Wt 103.0 lb

## 2021-01-04 DIAGNOSIS — I5032 Chronic diastolic (congestive) heart failure: Secondary | ICD-10-CM

## 2021-01-04 DIAGNOSIS — J45909 Unspecified asthma, uncomplicated: Secondary | ICD-10-CM | POA: Diagnosis not present

## 2021-01-04 DIAGNOSIS — R059 Cough, unspecified: Secondary | ICD-10-CM | POA: Diagnosis not present

## 2021-01-04 DIAGNOSIS — J309 Allergic rhinitis, unspecified: Secondary | ICD-10-CM

## 2021-01-04 DIAGNOSIS — K449 Diaphragmatic hernia without obstruction or gangrene: Secondary | ICD-10-CM | POA: Diagnosis not present

## 2021-01-04 DIAGNOSIS — J471 Bronchiectasis with (acute) exacerbation: Secondary | ICD-10-CM | POA: Insufficient documentation

## 2021-01-04 DIAGNOSIS — U071 COVID-19: Secondary | ICD-10-CM

## 2021-01-04 LAB — CBC WITH DIFFERENTIAL/PLATELET
Basophils Absolute: 0 10*3/uL (ref 0.0–0.1)
Basophils Relative: 0.1 % (ref 0.0–3.0)
Eosinophils Absolute: 0 10*3/uL (ref 0.0–0.7)
Eosinophils Relative: 0 % (ref 0.0–5.0)
HCT: 44.7 % (ref 36.0–46.0)
Hemoglobin: 14.5 g/dL (ref 12.0–15.0)
Lymphocytes Relative: 12.7 % (ref 12.0–46.0)
Lymphs Abs: 1.1 10*3/uL (ref 0.7–4.0)
MCHC: 32.3 g/dL (ref 30.0–36.0)
MCV: 102.8 fl — ABNORMAL HIGH (ref 78.0–100.0)
Monocytes Absolute: 0.5 10*3/uL (ref 0.1–1.0)
Monocytes Relative: 5.7 % (ref 3.0–12.0)
Neutro Abs: 6.8 10*3/uL (ref 1.4–7.7)
Neutrophils Relative %: 81.5 % — ABNORMAL HIGH (ref 43.0–77.0)
Platelets: 223 10*3/uL (ref 150.0–400.0)
RBC: 4.35 Mil/uL (ref 3.87–5.11)
RDW: 12.9 % (ref 11.5–15.5)
WBC: 8.3 10*3/uL (ref 4.0–10.5)

## 2021-01-04 LAB — BASIC METABOLIC PANEL
BUN: 17 mg/dL (ref 6–23)
CO2: 30 mEq/L (ref 19–32)
Calcium: 9 mg/dL (ref 8.4–10.5)
Chloride: 98 mEq/L (ref 96–112)
Creatinine, Ser: 0.91 mg/dL (ref 0.40–1.20)
GFR: 52.71 mL/min — ABNORMAL LOW (ref >60.00)
Glucose, Bld: 140 mg/dL — ABNORMAL HIGH (ref 70–99)
Potassium: 4.1 mEq/L (ref 3.5–5.1)
Sodium: 135 mEq/L (ref 135–145)

## 2021-01-04 LAB — BRAIN NATRIURETIC PEPTIDE: Pro B Natriuretic peptide (BNP): 76 pg/mL (ref 0.0–100.0)

## 2021-01-04 MED ORDER — SODIUM CHLORIDE 3 % IN NEBU: INHALATION_SOLUTION | Freq: Two times a day (BID) | RESPIRATORY_TRACT | 5 refills | Status: DC

## 2021-01-04 MED ORDER — PREDNISONE 20 MG PO TABS: 40.0000 mg | ORAL_TABLET | Freq: Every day | ORAL | 0 refills | Status: AC

## 2021-01-04 MED ORDER — GUAIFENESIN ER 600 MG PO TB12: 1200.0000 mg | ORAL_TABLET | Freq: Two times a day (BID) | ORAL | 3 refills | Status: DC

## 2021-01-04 MED ORDER — MOLNUPIRAVIR EUA 200MG CAPSULE: 4.0000 | ORAL_CAPSULE | Freq: Two times a day (BID) | ORAL | 0 refills | Status: AC

## 2021-01-04 NOTE — Progress Notes (Unsigned)
At visit, patient's daughter stated that the patient had been swabbed for COVID at facility and was negative. After being seen in office, daughter contacted to notify that she had actually tested positive for COVID 19 at her facility. The facility had not notified the daughter and the patient until after they returned from their visit today. Will send rx for molnupiravir 800 mg Twice daily for 5 days. Advised to take with food. Strict ED precautions. Monitor SpO2 for goal >88-90%. CXR today did not show any acute cardiopulmonary findings. Will continue on azithromycin daily and prednisone 40 mg for 5 days with return to 10 mg baseline.

## 2021-01-04 NOTE — Assessment & Plan Note (Signed)
No maintenance inhaler currently. Suspect this is bronchiectatic flare vs asthma. See above plan.

## 2021-01-04 NOTE — Assessment & Plan Note (Addendum)
Appears compensated upon exam; however, given frothy appearance of sputum, will check BNP for further evaluation - 76 pg/mL

## 2021-01-04 NOTE — Patient Instructions (Addendum)
-  Continue albuterol nebs 3 mL Twice daily. Can do every 6 hours as needed for shortness of breath or wheezing -Continue azithromycin 250 mg daily -Continue protonix 40 mg Twice daily   -Increase prednisone to 40 mg for 5 days then return to your normal dose of 10 mg daily.  -Mucinex (guaifenesin) 1200 mg (2 tablets) twice daily  -Hypertonic saline nebulizer 3 mL Twice daily   Flutter valve 10 x an hour and chest vest therapy at home.  Chest x ray today. We will notify you of results.   Walking oximetry with oxygen saturation low of 89%. Monitor oxygen levels at home and notify if persistently <88-90%  Labs today.  Follow up with Dr. Delton Coombes as scheduled. If symptoms do not improve or worsen, please contact office for sooner follow up or seek emergency care.

## 2021-01-04 NOTE — Assessment & Plan Note (Addendum)
Stable. No change in sinus symptoms.

## 2021-01-04 NOTE — Assessment & Plan Note (Addendum)
Discovered after visit that patient had tested positive 12/21 for COVID at her facility; however, no one notified the patient or daughter. Flare with increased cough and SOB. CXR for further eval without acute process. Continue on azithromycin daily. Increase prednisone to 40 mg for 5 days then return to 10 mg daily. Mucolytic therapy with mucinex, flutter valve and chest vest therapy. Hypertonic saline nebs added. Continue albuterol nebs Twice daily. CBC with diff and BMET - unremarkable.   Patient Instructions  -Continue albuterol nebs 3 mL Twice daily. Can do every 6 hours as needed for shortness of breath or wheezing -Continue azithromycin 250 mg daily -Continue protonix 40 mg Twice daily   -Increase prednisone to 40 mg for 5 days then return to your normal dose of 10 mg daily.  -Mucinex (guaifenesin) 1200 mg (2 tablets) twice daily  -Hypertonic saline nebulizer 3 mL Twice daily   Flutter valve 10 x an hour and chest vest therapy at home.  Chest x ray today. We will notify you of results.   Walking oximetry with oxygen saturation low of 89%. Monitor oxygen levels at home and notify if persistently <88-90%  Labs today.  Follow up with Dr. Delton Coombes as scheduled. If symptoms do not improve or worsen, please contact office for sooner follow up or seek emergency care.

## 2021-01-04 NOTE — Telephone Encounter (Signed)
I called the patient daughter and she is agreeable to the recommendations and she will pick up the medications at the pharmacy. She did not have any other questions.

## 2021-01-04 NOTE — Telephone Encounter (Signed)
Called and spoke with pt's daughter Sharyl Nimrod. Per Sharyl Nimrod, pt tested negative at the facility Monday but then Wednesday, when she was tested, the results did come back positive but nobody from the facility had told them that she had tested positive until they got back after the appt.  Sharyl Nimrod wants to know if pt should be started on Paxlovid or something to help treat the covid. Katie, please advise.

## 2021-01-04 NOTE — Progress Notes (Addendum)
_0  ID: Sarah [name], female    DOB: 18-Mar-1921, 87 y.o.   MRN: [id number]  Chief Complaint  Patient presents with   Follow-up    Patient says she's been getting worse this week.     Referring provider: [name], MD  HPI: 87 year old female, never smoker followed for asthma, bronchiectasis with pseudomonal colonization, allergic rhinitis, chronic cough, GERD.  She is a patient of Dr. Agustina Caroli and was last seen in office on 10/13/2020.  Past medical history significant for hypertension, NSTEMI, stroke, CAD, chronic diastolic CHF, hypothyroidism, arthritis, adrenal insufficiency, CKD stage III, depression anxiety, HLD, IDA, macular degeneration, prediabetes.  TEST/EVENTS:  02/24/2012 CT chest without contrast: Mediastinal lymph nodes measure up to 12 mm in the precarinal station, as before.  Moderate hiatal hernia.  Trace amount of loculated pleural fluid and posterior medial inferior left hemithorax.  Mild biapical pleural-parenchymal scarring.  Interval progression of bibasilar predominant peribronchovascular nodularity, areas of airspace consolidation and scattered bronchiectasis. Findings suggestive of MAC. 07/02/2019 CTA chest: Scattered low-attenuation mediastinal and hilar nodes, many of which are subcentimeter.  Largest node is a 13 mm precarinal lymph node, stable.  Airways are diffusely thickened and many towards the bases appear fluid-filled with some chronic architectural distortion and scarring in the left lower lobe, lingula and right middle lobe.  Increasing consolidative opacities present in the right lung base.  Additional patchy consolidation with tree-in-bud nodularity is present in the right upper lobe as well.  Some mosaic attenuation may reflect further small airways disease or air trapping.  No PE.  Large hiatal hernia. 11/14/2020 CXR 1 view: Unchanged chronic increased opacity at the left lung base, likely atelectasis.  No new acute abnormality.  10/13/2020: OV with  Dr Lamonte Sakai. Previously managed with rotating antibiotics (azithromycin and Omnicef), chronic prednisone 10 mg daily, scheduled albuterol nebs with chest vest for secretion clearance. Stopped omnicef and continued daily azithromycin 250 mg and 10 mg prednisone. Flu shot. Continue albuterol neb Twice daily and chest vest therapy.   01/04/2021: Today - acute visit Patient presents today with daughter for reported worsening cough. She has a chronic cough at baseline with brown sputum production but over the past two weeks, her cough has increased in frequency and she is coughing up more frothy, clear sputum. The cough is paroxysmal at time. Her shortness of breath has increased with exertion and she feels weaker than she normally does. She reports that her blood pressure was in the 80's a few days ago at her facility and she had a fever 2 or 3 days ago but denies one since. She denies chills or body aches, nasal congestion or sore throat. She denies any wheezing, hemoptysis, orthopnea, PND, chest pain, or lower extremity swelling. Her facility tests 3-4 times a week for COVID and she was most recently swabbed this morning and was negative. She continues on azithromycin and prednisone 10 mg daily. She has been doing albuterol nebulizer treatments Twice daily. She has not been utilizing her vest therapy much at home.    Allergies  Allergen Reactions   Codeine Anaphylaxis   Hydrocodone Nausea And Vomiting and Other (See Comments)    SYNCOPE AND BRADYCARDIA   Isosorbide Other (See Comments)    BRADYCARDIA   Isosorbide Nitrate Other (See Comments)    BRADYCARDIA   Oxycodone Palpitations    Rapid heart beat   Clarithromycin Nausea And Vomiting    Has patient had a PCN reaction causing immediate rash, facial/tongue/throat swelling, SOB or lightheadedness with hypotension:  Unknown Has patient had a PCN reaction causing severe rash involving mucus membranes or skin necrosis: Unknown Has patient had a PCN  reaction that required hospitalization: Unknown Has patient had a PCN reaction occurring within the last 10 years: Unknown If all of the above answers are "NO", then may proceed with Cephalosporin use.    Fiorinal [Butalbital-Aspirin-Caffeine] Swelling and Other (See Comments)    Facial swelling    Levofloxacin Nausea And Vomiting and Other (See Comments)    SYNCOPE ALSO   Augmentin [Amoxicillin-Pot Clavulanate] Diarrhea   Doxycycline     Sweats and aches   Hydrocodone-Acetaminophen    Tylenol With Codeine #3 [Acetaminophen-Codeine]     Other reaction(s): angioedema    Immunization History  Administered Date(s) Administered   Fluad Quad(high Dose 65+) 10/08/2018, 10/11/2019, 10/13/2020   Influenza Split 10/15/2011, 10/14/2012, 09/15/2013, 10/29/2016, 10/08/2018, 10/11/2019   Influenza Whole 10/17/2008, 11/14/2009, 11/13/2010   Influenza, High Dose Seasonal PF 11/21/2015, 10/29/2016, 09/22/2017   Influenza,inj,Quad PF,6+ Mos 09/17/2013, 10/07/2014   Influenza,inj,quad, With Preservative 11/08/2014   Influenza-Unspecified 10/13/2020   Moderna Covid-19 Vaccine Bivalent Booster 49yr & up 09/21/2020   Moderna Sars-Covid-2 Vaccination 02/04/2019, 03/04/2019, 10/15/2019, 06/07/2020   Pneumococcal Conjugate-13 04/07/2014   Pneumococcal Polysaccharide-23 02/19/1999   Tdap 09/21/2012    Past Medical History:  Diagnosis Date   Allergic rhinitis    Anxiety    Anxiety attacks   Arthritis    "a little; in my back and hands" (04/15/2012)   Asthma    Bronchiectasis    with history of Legionaires with chronic rales/rhonchi   Chest pain at rest    Daily headache    "over the last week" 04/15/2012    Depression    Diastolic dysfunction    a. per echo 08/2010 with normal LV function;  b. 06/2011 Echo: EF 65-70%   GERD (gastroesophageal reflux disease)    "just recently" (04/15/2012)   H/O hiatal hernia    H/O Legionnaire's disease 11/1976   History of blood transfusion 1972    "w/hysterectomy" (04/15/2012)   Hypercholesteremia    Hypertension    Negative renal duplex in December of 2012   Hypothyroidism    Idiopathic peripheral neuropathy    MAC (mycobacterium avium-intracellulare complex)    Migraines    "outgrew them" (04/15/2012)   [place]    a. Normal coronaries per cath August 2012 and negative CT angio for PE;  b. 06/2011 Repeat admission w/ chest pain and elevated troponin's, CTA Chest w/o PE   Osteopenia 02/2011   t score - 2.1   Pneumonia    "recurrent" (04/15/2012)   Pollen allergies    Shortness of breath    "sometimes just lying down" (04/15/2012)   [place]   cerebellum    Tobacco History: Social History   Tobacco Use  Smoking Status Never  Smokeless Tobacco Never   Counseling given: Not Answered   Outpatient Medications Prior to Visit  Medication Sig Dispense Refill   acetaminophen (TYLENOL) 500 MG tablet Take 500 mg by mouth every 6 (six) hours as needed for moderate pain.     albuterol (PROVENTIL) (2.5 MG/3ML) 0.083% nebulizer solution INHALE 1 VIAL VIA NEBULIZER IN THE MORNING AND AT BEDTIME AS DIRECTED 75 mL 25   ALPRAZolam (XANAX) 0.5 MG tablet Take 1 tablet (0.5 mg total) by mouth as directed. Take 1 tablet (0.5 mg) BID at (8 am & 8 pm) & DAILY PRN FOR ANXIETY 10 tablet 0  ALPRAZolam (XANAX) 0.5 MG tablet 1 tablet     amLODipine (NORVASC) 2.5 MG tablet Take 2.5 mg by mouth daily.     amLODipine (NORVASC) 5 MG tablet Take 2.5 mg by mouth every evening.     azithromycin (ZITHROMAX) 250 MG tablet TAKE 2 TABLETS BY MOUTH TODAY, THEN TAKE 1 TABLET DAILY FOR 4 DAYS. ALTERNATE EVERY OTHER MONTH WITH CEFDINIR 6 tablet 1   Brinzolamide-Brimonidine (SIMBRINZA) 1-0.2 % SUSP 1 drop into affected eye     calcium carbonate (TUMS - DOSED IN MG ELEMENTAL CALCIUM) 500 MG chewable tablet 1 tablet as needed for indigestion or heartburn     carvedilol (COREG) 6.25 MG tablet 1 tablet     clopidogrel  (PLAVIX) 75 MG tablet 1 tablet     DULoxetine (CYMBALTA) 30 MG capsule TAKE 1 CAPSULE     Elastic Bandages & Supports (JOBST ACTIVE 20-30MMHG MEDIUM) MISC See admin instructions.     escitalopram (LEXAPRO) 5 MG tablet Take 1 tablet by mouth daily.     Iron-FA-B Cmp-C-Biot-Probiotic (FUSION PLUS) CAPS 1 capsule between meals     levothyroxine (SYNTHROID) 50 MCG tablet 1 tablet in the morning on an empty stomach     mupirocin ointment (BACTROBAN) 2 % Apply to affected digits once daily. (Patient taking differently: Apply 1 application topically daily as needed (dry skin).) 30 g 1   nitrofurantoin (MACRODANTIN) 100 MG capsule 1 capsule at bedtime with food or milk     NONFORMULARY OR COMPOUNDED ITEM [facility]:  Neuropathy Cream #11 - Bupivacaine 1%, Doxepin 3%, Gabapentin 6%, Pentoxifylline 3%, Topiramate 1%. Apply 1-2 grams to affected areas 3-4 times a day. (Patient taking differently: [facility]:  Neuropathy Cream #11 - Bupivacaine 1%, Doxepin 3%, Gabapentin 6%, Pentoxifylline 3%, Topiramate 1%. Apply 1-2 grams to affected areas 3-4 times a day PRN for Dry Skin) 100 each 5   pantoprazole (PROTONIX) 40 MG tablet Take 1 tablet by mouth 2 (two) times daily.     polyethylene glycol (MIRALAX / GLYCOLAX) 17 g packet Take 17 g by mouth daily as needed for mild constipation. 14 each 0   predniSONE (DELTASONE) 10 MG tablet TAKE 1 TABLET BY MOUTH EVERY DAY WITH BREAKFAST 30 tablet 2   ROCKLATAN 0.02-0.005 % SOLN Place 1 drop into both eyes at bedtime.     senna (SENOKOT) 8.6 MG TABS tablet Take 1 tablet (8.6 mg total) by mouth daily. 120 tablet 0   Simethicone (GAS-X PO)      traMADol (ULTRAM) 50 MG tablet Take 1 tablet (50 mg total) by mouth every 6 (six) hours as needed for moderate pain. 30 tablet 0   No facility-administered medications prior to visit.     Review of Systems:   Constitutional: No weight loss or gain, night sweats, chills. +fever a few days ago; fatigue HEENT: No  headaches, difficulty swallowing, tooth/dental problems, or sore throat. No sneezing, itching, ear ache, nasal congestion, or post nasal drip CV:  No chest pain, orthopnea, PND, swelling in lower extremities, anasarca, dizziness, palpitations, syncope Resp: +shortness of breath with exertion; increased cough with increased sputum production. No hemoptysis. No wheezing.  No chest wall deformity GI:  No heartburn, indigestion, abdominal pain, nausea, vomiting, diarrhea, change in bowel habits, loss of appetite, bloody stools.  GU: No dysuria, change in color of urine, urgency or frequency.  No flank pain, no hematuria  Skin: No rash, lesions, ulcerations MSK:  No joint pain or swelling.  No decreased range of motion.  No back pain. Neuro: No dizziness or lightheadedness.  Psych: No depression or anxiety. Mood stable.     Physical Exam:  BP 102/86 (BP Location: Left Arm, Patient Position: Sitting, Cuff Size: Normal)    Pulse 95    Temp 97.6 F (36.4 C) (Oral)    Ht _0  (1.626 m)    Wt 103 lb (46.7 kg)    LMP 01/14/1970    SpO2 91%    BMI 17.68 kg/m   GEN: Pleasant, interactive, chronically-ill appearing; in no acute distress. HEENT:  Normocephalic and atraumatic. EACs patent bilaterally. TM pearly gray with present light reflex bilaterally. PERRLA. Sclera white. Nasal turbinates pink, moist and patent bilaterally. No rhinorrhea present. Oropharynx pink and moist, without exudate or edema. No lesions, ulcerations, or postnasal drip.  NECK:  Supple w/ fair ROM. No JVD present. Normal carotid impulses w/o bruits. Thyroid symmetrical with no goiter or nodules palpated. No lymphadenopathy.   CV: RRR, no m/r/g, no peripheral edema. Pulses intact, +2 bilaterally. No cyanosis, pallor or clubbing. PULMONARY:  Unlabored, regular breathing. Diminished right base, scattered rhonchi posteriorly w/o wheeze. No accessory muscle use. No dullness to percussion. GI: BS present and normoactive. Soft, non-tender  to palpation. No organomegaly or masses detected. No CVA tenderness. MSK: No erythema, warmth or tenderness. Cap refil <2 sec all extrem. No deformities or joint swelling noted.  Neuro: A/Ox3. No focal deficits noted.   Skin: Warm, no lesions or rashe Psych: Normal affect and behavior. Judgement and thought content appropriate.     Lab Results:  CBC    Component Value Date/Time   WBC 8.3 01/04/2021 1450   RBC 4.35 01/04/2021 1450   HGB 14.5 01/04/2021 1450   HCT 44.7 01/04/2021 1450   PLT 223.0 01/04/2021 1450   MCV 102.8 (H) 01/04/2021 1450   MCH 32.2 01/26/2020 0332   MCHC 32.3 01/04/2021 1450   RDW 12.9 01/04/2021 1450   LYMPHSABS 1.1 01/04/2021 1450   MONOABS 0.5 01/04/2021 1450   EOSABS 0.0 01/04/2021 1450   BASOSABS 0.0 01/04/2021 1450    BMET    Component Value Date/Time   NA 135 01/04/2021 1450   NA 139 03/25/2017 1216   K 4.1 01/04/2021 1450   CL 98 01/04/2021 1450   CO2 30 01/04/2021 1450   GLUCOSE 140 (H) 01/04/2021 1450   BUN 17 01/04/2021 1450   BUN 14 03/25/2017 1216   CREATININE 0.91 01/04/2021 1450   CALCIUM 9.0 01/04/2021 1450   GFRNONAA >60 01/26/2020 0332   GFRAA >60 07/09/2019 0559    BNP    Component Value Date/Time   BNP 183.9 (H) 07/02/2019 1807     Imaging:  DG Chest 2 View  Result Date: 01/04/2021 CLINICAL DATA:  Productive cough EXAM: CHEST - 2 VIEW COMPARISON:  01/25/2020 FINDINGS: Levoscoliosis spine. Normal cardiac silhouette. Large hiatal hernia post the heart. No effusion, infiltrate or pneumothorax identified. No acute osseous abnormality. IMPRESSION: 1. Large hiatal hernia. 2. Dextroscoliosis. 3. No acute findings Electronically Signed   By: Suzy Bouchard M.D.   On: 01/04/2021 15:20      No flowsheet data found.  No results found for: NITRICOXIDE   Walking oximetry 01/04/2021: SpO2 low 89%; distance limited by mobility   Assessment & Plan:   Bronchiectasis with (acute) exacerbation (HCC) Flare with increased  cough and SOB. CXR for further eval. Continue on azithromycin daily. Increase prednisone to 40 mg for 5 days then return to 10 mg daily. Mucolytic therapy with mucinex, flutter  valve and chest vest therapy. Hypertonic saline nebs added. Continue albuterol nebs Twice daily. CBC with diff and BMET - unremarkable.   Patient Instructions  -Continue albuterol nebs 3 mL Twice daily. Can do every 6 hours as needed for shortness of breath or wheezing -Continue azithromycin 250 mg daily -Continue protonix 40 mg Twice daily   -Increase prednisone to 40 mg for 5 days then return to your normal dose of 10 mg daily.  -Mucinex (guaifenesin) 1200 mg (2 tablets) twice daily  -Hypertonic saline nebulizer 3 mL Twice daily   Flutter valve 10 x an hour and chest vest therapy at home.  Chest x ray today. We will notify you of results.   Walking oximetry with oxygen saturation low of 89%. Monitor oxygen levels at home and notify if persistently <88-90%  Labs today.  Follow up with [name] as scheduled. If symptoms do not improve or worsen, please contact office for sooner follow up or seek emergency care.   Chronic diastolic CHF (congestive heart failure) (HCC) Appears compensated upon exam; however, given frothy appearance of sputum, will check BNP for further evaluation - 76 pg/mL  Allergic rhinitis Stable. No change in sinus symptoms.  Asthma No maintenance inhaler currently. Suspect this is bronchiectatic flare vs asthma. See above plan.   Clayton Bibles, NP 01/05/2021  Pt aware and understands NP's role.

## 2021-01-04 NOTE — Telephone Encounter (Signed)
I sent rx for molnupiravir 800 mg Twice daily for five days. Take with food. Monitor oxygen closely for SpO2 goal >88-90%. Seek emergency care if worsening symptoms occur. Thanks

## 2021-01-05 DIAGNOSIS — U071 COVID-19: Secondary | ICD-10-CM | POA: Insufficient documentation

## 2021-01-05 NOTE — Assessment & Plan Note (Signed)
Facility had been swabbing patients 3-4 times a week; at visit, pt's daughter reported that she had tested negative. After they left, they contacted the office to notify that the facility stated she was positive as of Wednesday, 12/21 but no one ever notified the patient or family. Will tx with molnupiravir 800 mg bid for 5 days. Instructed on proper use. ED precautions.

## 2021-01-18 ENCOUNTER — Ambulatory Visit (INDEPENDENT_AMBULATORY_CARE_PROVIDER_SITE_OTHER): Payer: Medicare Other | Admitting: Emergency Medicine

## 2021-01-18 ENCOUNTER — Encounter: Payer: Self-pay | Admitting: Emergency Medicine

## 2021-01-18 ENCOUNTER — Other Ambulatory Visit: Payer: Self-pay

## 2021-01-18 DIAGNOSIS — J479 Bronchiectasis, uncomplicated: Secondary | ICD-10-CM

## 2021-01-18 NOTE — Patient Instructions (Addendum)
Please continue azithromycin 250 mg once daily.  We will change the prescription so that you can get a 90-day supply Continue prednisone 10 mg once daily. Use your albuterol 2-3 times daily as you need it for shortness of breath and also mucus clearance Use your chest vest and your flutter valve when you need it to clear your mucus Continue protonix twice a day Follow with APP in 1 month Follow Dr. Delton Coombes in 2 months

## 2021-01-18 NOTE — Progress Notes (Signed)
° °  Subjective:    Patient ID: Sarah [name], female    DOB: [birth year], 88 y.o.   MRN: [id number]  HPI  ROV 10/13/20 --follow-up visit for [name].  She is 76 and has a history of significant bronchiectasis, pseudomonal colonization.  Chronic cough due to this as well as her upper airway irritation from rhinitis, GERD.  She may also have a component of asthmatic bronchitis, has benefited from chronic prednisone 10 mg and albuterol.  She rotates azithromycin and Omnicef the beginning of each month. She had more mucous to clear over the Summer. Currently doing ok. She uses chest vest some, but not every day. She has had a few episode when she has waked up in the middle of the night to cough. Doing albuterol 2x a day.   ROV 01/18/21 --88 year old woman with bronchiectasis, obstructive lung disease and chronic cough.  Colonized with Pseudomonas.  There is an upper airway component, irritation from rhinitis and GERD.  She has been maintained on chronic prednisone 10 mg daily, uses albuterol for shortness of breath and also secretion clearance.  She is on azithromycin daily (used to be on rotating abx).  She uses chest vest intermittently. She was COVID-positive 12/21 with some flaring symptoms, treated with increased prednisone for 5 days, antiviral, Mucinex, hypertonic saline and flutter valve.  Today she reports that she is continues to feel bad. She was very fatigued, became depressed as well. She still does not feel back to baseline. Clearing her secretions.     Review of Systems As above      Objective:   Physical Exam  Vitals:   01/18/21 1607  BP: 106/62  Pulse: 92  Temp: (!) 97.4 F (36.3 C)  TempSrc: Oral  SpO2: 92%  Weight: 105 lb (47.6 kg)    GEN: A/Ox3, anxious, tearful   HEENT:  OP clear  NECK: no stridor  RESP distant, B insp rhonchi, insp squeak B upper zones  CARD:  Regular, no M  Musco: some chronic venous stasis, bilateral ankle edema.   Neuro: awake, poor  hearing, oriented       Assessment & Plan:  Bronchiectasis without complication (HCC) Bronchiectasis with associated COPD.  She recently had COVID-19 and is improved from the acute illness but is still very debilitated, clearly not back to her usual baseline.  I did encourage her that it will take time for her to recover her strength and functional capacity.  We will continue her same regimen and follow-up with her frequently to ensure that she not backtracking.  Please continue azithromycin 250 mg once daily.  We will change the prescription so that you can get a 90-day supply Continue prednisone 10 mg once daily. Use your albuterol 2-3 times daily as you need it for shortness of breath and also mucus clearance Use your chest vest and your flutter valve when you need it to clear your mucus Continue protonix twice a day Follow with APP in 1 month Follow Dr. Delton Coombes in 2 months  Levy Pupa, MD, PhD 01/18/2021, 4:30 PM [facility] [phone] or if no answer [phone]

## 2021-01-18 NOTE — Assessment & Plan Note (Signed)
Bronchiectasis with associated COPD.  She recently had COVID-19 and is improved from the acute illness but is still very debilitated, clearly not back to her usual baseline.  I did encourage her that it will take time for her to recover her strength and functional capacity.  We will continue her same regimen and follow-up with her frequently to ensure that she not backtracking.  Please continue azithromycin 250 mg once daily.  We will change the prescription so that you can get a 90-day supply Continue prednisone 10 mg once daily. Use your albuterol 2-3 times daily as you need it for shortness of breath and also mucus clearance Use your chest vest and your flutter valve when you need it to clear your mucus Continue protonix twice a day Follow with APP in 1 month Follow Dr. Delton Coombes in 2 months

## 2021-01-22 DIAGNOSIS — Z20828 Contact with and (suspected) exposure to other viral communicable diseases: Secondary | ICD-10-CM | POA: Diagnosis not present

## 2021-01-23 ENCOUNTER — Telehealth (INDEPENDENT_AMBULATORY_CARE_PROVIDER_SITE_OTHER): Payer: Medicare Other | Admitting: Podiatry

## 2021-01-23 ENCOUNTER — Encounter: Payer: Self-pay | Admitting: Podiatry

## 2021-01-23 ENCOUNTER — Other Ambulatory Visit: Payer: Self-pay

## 2021-01-23 ENCOUNTER — Ambulatory Visit (INDEPENDENT_AMBULATORY_CARE_PROVIDER_SITE_OTHER): Payer: Medicare Other | Admitting: Podiatry

## 2021-01-23 VITALS — BP 136/82 | HR 76 | Temp 97.8°F

## 2021-01-23 DIAGNOSIS — I739 Peripheral vascular disease, unspecified: Secondary | ICD-10-CM

## 2021-01-23 DIAGNOSIS — B351 Tinea unguium: Secondary | ICD-10-CM

## 2021-01-23 DIAGNOSIS — M79675 Pain in left toe(s): Secondary | ICD-10-CM | POA: Diagnosis not present

## 2021-01-23 DIAGNOSIS — Q828 Other specified congenital malformations of skin: Secondary | ICD-10-CM | POA: Diagnosis not present

## 2021-01-23 DIAGNOSIS — S81811A Laceration without foreign body, right lower leg, initial encounter: Secondary | ICD-10-CM | POA: Diagnosis not present

## 2021-01-23 DIAGNOSIS — M79674 Pain in right toe(s): Secondary | ICD-10-CM | POA: Diagnosis not present

## 2021-01-23 DIAGNOSIS — L84 Corns and callosities: Secondary | ICD-10-CM | POA: Diagnosis not present

## 2021-01-23 DIAGNOSIS — L97909 Non-pressure chronic ulcer of unspecified part of unspecified lower leg with unspecified severity: Secondary | ICD-10-CM | POA: Insufficient documentation

## 2021-01-23 NOTE — Progress Notes (Signed)
Subjective:  Patient ID: Sarah [name], female    DOB: 10-Sep-1921,  MRN: [id number]  88 y.o. female presents with for at risk foot care. Patient has h/o PAD and corn(s) bilaterally and painful thick toenails that are difficult to trim. Painful toenails interfere with ambulation. Aggravating factors include wearing enclosed shoe gear. Pain is relieved with periodic professional debridement. Painful corns are aggravated when weightbearing when wearing enclosed shoe gear. Pain is relieved with periodic professional debridement..    Patient states she will be celebrating her 89th birthday on tomorrow. She is accompanied by her caregiver, KP.  PCP is Dr. [name] and last visit was 06/21/2020  Medical Assistant removed compression hose and patient encountered a skin tear on the anterior aspect of her right leg. See pictures below.  Review of Systems: Negative except as noted in the HPI.   Allergies  Allergen Reactions   Codeine Anaphylaxis   Hydrocodone Nausea And Vomiting and Other (See Comments)    SYNCOPE AND BRADYCARDIA Other reaction(s): Unknown   Isosorbide Other (See Comments)    BRADYCARDIA   Isosorbide Nitrate Other (See Comments)    BRADYCARDIA   Oxycodone Palpitations    Rapid heart beat Other reaction(s): vomiting   Butalbital-Aspirin-Caffeine Swelling and Other (See Comments)    Facial swelling  Other reaction(s): angioedema   Clarithromycin Nausea And Vomiting    Has patient had a PCN reaction causing immediate rash, facial/tongue/throat swelling, SOB or lightheadedness with hypotension: Unknown Has patient had a PCN reaction causing severe rash involving mucus membranes or skin necrosis: Unknown Has patient had a PCN reaction that required hospitalization: Unknown Has patient had a PCN reaction occurring within the last 10 years: Unknown If all of the above answers are "NO", then may proceed with Cephalosporin use.  Other reaction(s): vomiting   Levofloxacin  Nausea And Vomiting and Other (See Comments)    SYNCOPE ALSO Other reaction(s): vomiting   Acetaminophen-Codeine     Other reaction(s): angioedema Other reaction(s): angioedema   Amoxicillin-Pot Clavulanate Diarrhea    Other reaction(s): watery incontinent diarrhea   Doxycycline     Sweats and aches   Doxycycline Hyclate     Other reaction(s): Unknown   Hydrocodone-Acetaminophen    Isosorbide Dinitrate     Objective:   Vitals:   01/23/21 1428  BP: 136/82  Pulse: 76  Temp: 97.8 F (36.6 C)   Constitutional Patient is a pleasant 88 y.o. Caucasian female thin build in NAD. AAO x 3.  Vascular Capillary fill time to digits <3 seconds b/l lower extremities. Faintly palpable DP pulse(s) b/l lower extremities. Nonpalpable PT pulse(s) b/l lower extremities. Pedal hair absent. Lower extremity skin temperature gradient within normal limits. No pain with calf compression b/l. No cyanosis or clubbing noted. +1 pitting edema noted BLE. Evidence of chronic venous insufficiency b/l LE.  Neurologic Normal speech. Protective sensation intact 5/5 intact bilaterally with 10g monofilament b/l. Vibratory sensation intact b/l.  Dermatologic Pedal skin is thin shiny, atrophic b/l lower extremities. No interdigital macerations b/l lower extremities. Toenails 1-5 b/l elongated, discolored, dystrophic, thickened, crumbly with subungual debris and tenderness to dorsal palpation. Hyperkeratotic lesion(s) R 2nd toe.  No erythema, no edema, no drainage, no fluctuance. Porokeratotic lesion(s) L 4th toe and R 5th toe. No erythema, no edema, no drainage, no fluctuance.  Fresh skin tear right anterior leg measures 6.0 x 3.5 cm. There seems to be clear, thin liquid seeping. No penetration into deep tissues.      Orthopedic: Normal muscle strength 5/5  to all lower extremity muscle groups bilaterally. Severe hammertoe deformity noted 2-5 bilaterally.   Assessment:   1. Pain due to onychomycosis of toenails of both  feet   2. Noninfected skin tear of right leg, initial encounter   3. Corns   4. Porokeratosis   5. PAD (peripheral artery disease) ([place])    Plan:  Patient was evaluated and treated and all questions answered. Consent given for treatment as described below: -Examined patient. -Incident skin tear right leg to be reported in Safety Zone. RN cleansed skin tear and applied non-adherent dressing to right leg. Contacted Dr. Stephanie Acre regarding incident as she will need Home Health for dressing changes until healed. Daughter, Irish Elders, notified via telephone of incident. -Mycotic toenails 1-5 bilaterally were debrided in length and girth with sterile nail nippers and dremel without incident. -Corn(s) R 2nd toe pared utilizing sterile scalpel blade without complication or incident. Total number debrided=1. -Painful porokeratotic lesion(s) L 4th toe and R 5th toe pared and enucleated with sterile scalpel blade without incident. Total number of lesions debrided=2. -Patient/POA to call should there be question/concern in the interim.  Return in about 3 months (around 04/23/2021).  Marzetta Board, DPM

## 2021-01-23 NOTE — Telephone Encounter (Signed)
Notified daughter, Kendall Flack, about skin tear right leg which occurred when medical assistant was removing her compression hose. Witnessed by her caregiver, KP. We will follow up to make sure Home Health has been ordered.

## 2021-01-24 ENCOUNTER — Encounter: Payer: Self-pay | Admitting: Podiatry

## 2021-01-25 ENCOUNTER — Other Ambulatory Visit (INDEPENDENT_AMBULATORY_CARE_PROVIDER_SITE_OTHER): Payer: Medicare Other | Admitting: Podiatry

## 2021-01-25 DIAGNOSIS — S81811A Laceration without foreign body, right lower leg, initial encounter: Secondary | ICD-10-CM

## 2021-01-25 DIAGNOSIS — S81801A Unspecified open wound, right lower leg, initial encounter: Secondary | ICD-10-CM

## 2021-01-25 NOTE — Progress Notes (Signed)
Ordered Home Health Services through [facility] for Wound Care of right leg.

## 2021-02-01 DIAGNOSIS — R32 Unspecified urinary incontinence: Secondary | ICD-10-CM | POA: Diagnosis not present

## 2021-02-01 DIAGNOSIS — I509 Heart failure, unspecified: Secondary | ICD-10-CM | POA: Diagnosis not present

## 2021-02-01 DIAGNOSIS — Z7952 Long term (current) use of systemic steroids: Secondary | ICD-10-CM | POA: Diagnosis not present

## 2021-02-01 DIAGNOSIS — E039 Hypothyroidism, unspecified: Secondary | ICD-10-CM | POA: Diagnosis not present

## 2021-02-01 DIAGNOSIS — G629 Polyneuropathy, unspecified: Secondary | ICD-10-CM | POA: Diagnosis not present

## 2021-02-01 DIAGNOSIS — R54 Age-related physical debility: Secondary | ICD-10-CM | POA: Diagnosis not present

## 2021-02-01 DIAGNOSIS — I252 Old myocardial infarction: Secondary | ICD-10-CM | POA: Diagnosis not present

## 2021-02-01 DIAGNOSIS — E611 Iron deficiency: Secondary | ICD-10-CM | POA: Diagnosis not present

## 2021-02-01 DIAGNOSIS — I10 Essential (primary) hypertension: Secondary | ICD-10-CM | POA: Diagnosis not present

## 2021-02-01 DIAGNOSIS — H9190 Unspecified hearing loss, unspecified ear: Secondary | ICD-10-CM | POA: Diagnosis not present

## 2021-02-01 DIAGNOSIS — I2510 Atherosclerotic heart disease of native coronary artery without angina pectoris: Secondary | ICD-10-CM | POA: Diagnosis not present

## 2021-02-01 DIAGNOSIS — G43909 Migraine, unspecified, not intractable, without status migrainosus: Secondary | ICD-10-CM | POA: Diagnosis not present

## 2021-02-01 DIAGNOSIS — R1310 Dysphagia, unspecified: Secondary | ICD-10-CM | POA: Diagnosis not present

## 2021-02-01 DIAGNOSIS — I872 Venous insufficiency (chronic) (peripheral): Secondary | ICD-10-CM | POA: Diagnosis not present

## 2021-02-01 DIAGNOSIS — S81801D Unspecified open wound, right lower leg, subsequent encounter: Secondary | ICD-10-CM | POA: Diagnosis not present

## 2021-02-01 DIAGNOSIS — E785 Hyperlipidemia, unspecified: Secondary | ICD-10-CM | POA: Diagnosis not present

## 2021-02-01 DIAGNOSIS — N189 Chronic kidney disease, unspecified: Secondary | ICD-10-CM | POA: Diagnosis not present

## 2021-02-01 DIAGNOSIS — Z7902 Long term (current) use of antithrombotics/antiplatelets: Secondary | ICD-10-CM | POA: Diagnosis not present

## 2021-02-02 ENCOUNTER — Other Ambulatory Visit: Payer: Self-pay | Admitting: Podiatry

## 2021-02-02 ENCOUNTER — Telehealth: Payer: Self-pay | Admitting: *Deleted

## 2021-02-02 DIAGNOSIS — S81801A Unspecified open wound, right lower leg, initial encounter: Secondary | ICD-10-CM

## 2021-02-02 NOTE — Progress Notes (Signed)
New Orders sent to [facility] for Wound Care right leg.

## 2021-02-02 NOTE — Telephone Encounter (Signed)
Faxed the orders to [place] today, received confirmation-02/02/21

## 2021-02-02 NOTE — Telephone Encounter (Signed)
Sarah [name] w/ [facility]-fax # [phone])is calling to request orders for nursing and wound care: frequency would be once for 1 week,2 visits for 4 times weeks,1 week prn. She would also like to clean the wound with Xeroform cleaner, non adherent dressings. Please advise.

## 2021-02-06 ENCOUNTER — Ambulatory Visit: Payer: Medicare Other | Admitting: Podiatry

## 2021-02-08 DIAGNOSIS — I509 Heart failure, unspecified: Secondary | ICD-10-CM | POA: Diagnosis not present

## 2021-02-08 DIAGNOSIS — H9190 Unspecified hearing loss, unspecified ear: Secondary | ICD-10-CM | POA: Diagnosis not present

## 2021-02-08 DIAGNOSIS — I872 Venous insufficiency (chronic) (peripheral): Secondary | ICD-10-CM | POA: Diagnosis not present

## 2021-02-08 DIAGNOSIS — N189 Chronic kidney disease, unspecified: Secondary | ICD-10-CM | POA: Diagnosis not present

## 2021-02-08 DIAGNOSIS — G629 Polyneuropathy, unspecified: Secondary | ICD-10-CM | POA: Diagnosis not present

## 2021-02-08 DIAGNOSIS — S81801D Unspecified open wound, right lower leg, subsequent encounter: Secondary | ICD-10-CM | POA: Diagnosis not present

## 2021-02-10 DIAGNOSIS — H9190 Unspecified hearing loss, unspecified ear: Secondary | ICD-10-CM | POA: Diagnosis not present

## 2021-02-10 DIAGNOSIS — I509 Heart failure, unspecified: Secondary | ICD-10-CM | POA: Diagnosis not present

## 2021-02-10 DIAGNOSIS — N189 Chronic kidney disease, unspecified: Secondary | ICD-10-CM | POA: Diagnosis not present

## 2021-02-10 DIAGNOSIS — G629 Polyneuropathy, unspecified: Secondary | ICD-10-CM | POA: Diagnosis not present

## 2021-02-10 DIAGNOSIS — S81801D Unspecified open wound, right lower leg, subsequent encounter: Secondary | ICD-10-CM | POA: Diagnosis not present

## 2021-02-10 DIAGNOSIS — I872 Venous insufficiency (chronic) (peripheral): Secondary | ICD-10-CM | POA: Diagnosis not present

## 2021-02-12 DIAGNOSIS — W19XXXA Unspecified fall, initial encounter: Secondary | ICD-10-CM | POA: Diagnosis not present

## 2021-02-12 DIAGNOSIS — R54 Age-related physical debility: Secondary | ICD-10-CM | POA: Diagnosis not present

## 2021-02-12 DIAGNOSIS — S40011A Contusion of right shoulder, initial encounter: Secondary | ICD-10-CM | POA: Diagnosis not present

## 2021-02-12 DIAGNOSIS — E441 Mild protein-calorie malnutrition: Secondary | ICD-10-CM | POA: Diagnosis not present

## 2021-02-12 DIAGNOSIS — U099 Post covid-19 condition, unspecified: Secondary | ICD-10-CM | POA: Diagnosis not present

## 2021-02-12 DIAGNOSIS — B351 Tinea unguium: Secondary | ICD-10-CM | POA: Diagnosis not present

## 2021-02-12 DIAGNOSIS — I10 Essential (primary) hypertension: Secondary | ICD-10-CM | POA: Diagnosis not present

## 2021-02-13 ENCOUNTER — Ambulatory Visit: Payer: Medicare Other | Admitting: Podiatry

## 2021-02-13 DIAGNOSIS — H9190 Unspecified hearing loss, unspecified ear: Secondary | ICD-10-CM | POA: Diagnosis not present

## 2021-02-13 DIAGNOSIS — N189 Chronic kidney disease, unspecified: Secondary | ICD-10-CM | POA: Diagnosis not present

## 2021-02-13 DIAGNOSIS — S81801D Unspecified open wound, right lower leg, subsequent encounter: Secondary | ICD-10-CM | POA: Diagnosis not present

## 2021-02-13 DIAGNOSIS — I509 Heart failure, unspecified: Secondary | ICD-10-CM | POA: Diagnosis not present

## 2021-02-13 DIAGNOSIS — I872 Venous insufficiency (chronic) (peripheral): Secondary | ICD-10-CM | POA: Diagnosis not present

## 2021-02-13 DIAGNOSIS — G629 Polyneuropathy, unspecified: Secondary | ICD-10-CM | POA: Diagnosis not present

## 2021-02-14 ENCOUNTER — Other Ambulatory Visit: Payer: Self-pay | Admitting: Emergency Medicine

## 2021-02-14 DIAGNOSIS — R3911 Hesitancy of micturition: Secondary | ICD-10-CM | POA: Diagnosis not present

## 2021-02-15 DIAGNOSIS — G629 Polyneuropathy, unspecified: Secondary | ICD-10-CM | POA: Diagnosis not present

## 2021-02-15 DIAGNOSIS — I509 Heart failure, unspecified: Secondary | ICD-10-CM | POA: Diagnosis not present

## 2021-02-15 DIAGNOSIS — N189 Chronic kidney disease, unspecified: Secondary | ICD-10-CM | POA: Diagnosis not present

## 2021-02-15 DIAGNOSIS — H9190 Unspecified hearing loss, unspecified ear: Secondary | ICD-10-CM | POA: Diagnosis not present

## 2021-02-15 DIAGNOSIS — S81801D Unspecified open wound, right lower leg, subsequent encounter: Secondary | ICD-10-CM | POA: Diagnosis not present

## 2021-02-15 DIAGNOSIS — I872 Venous insufficiency (chronic) (peripheral): Secondary | ICD-10-CM | POA: Diagnosis not present

## 2021-02-19 ENCOUNTER — Encounter: Payer: Self-pay | Admitting: Nurse Practitioner

## 2021-02-19 ENCOUNTER — Ambulatory Visit (INDEPENDENT_AMBULATORY_CARE_PROVIDER_SITE_OTHER): Payer: Medicare Other | Admitting: Nurse Practitioner

## 2021-02-19 ENCOUNTER — Other Ambulatory Visit: Payer: Self-pay

## 2021-02-19 VITALS — BP 114/68 | HR 101 | Temp 97.7°F | Ht 64.0 in | Wt 101.6 lb

## 2021-02-19 DIAGNOSIS — J479 Bronchiectasis, uncomplicated: Secondary | ICD-10-CM

## 2021-02-19 DIAGNOSIS — J45909 Unspecified asthma, uncomplicated: Secondary | ICD-10-CM

## 2021-02-19 DIAGNOSIS — Z2239 Carrier of other specified bacterial diseases: Secondary | ICD-10-CM

## 2021-02-19 DIAGNOSIS — K219 Gastro-esophageal reflux disease without esophagitis: Secondary | ICD-10-CM

## 2021-02-19 NOTE — Patient Instructions (Addendum)
-  Continue albuterol nebs 3 mL Twice daily. Can do every 6 hours as needed for shortness of breath or wheezing -Continue azithromycin 250 mg daily -Continue protonix 40 mg Twice daily  -Continue prednisone 10 mg daily -Continue mucinex (guaifenesin) 600 mg twice daily  -Continue hypertonic saline nebulizer 3 mL Twice daily  -Continue flutter valve Twice daily and chest vest therapy at home.   Follow up with Dr. Delton Coombes as scheduled. If symptoms do not improve or worsen, please contact office for sooner follow up or seek emergency care.

## 2021-02-19 NOTE — Progress Notes (Signed)
_0  ID: [name], female    DOB: 1921/03/22, 88 y.o.   MRN: [id number]  Chief Complaint  Patient presents with   Follow-up    She reports that she is doing well with her breathing.     Referring provider: [name], MD  HPI: 88 year old female, never smoker followed for asthma, bronchiectasis with pseudomonal colonization, allergic rhinitis, chronic cough, GERD.  She is a patient Dr. Agustina Caroli and was last seen in office on 01/18/2021.  Past medical history significant for hypertension, NSTEMI, stroke, CAD, chronic diastolic CHF, hypothyroidism, arthritis, adrenal insufficiency, CKD stage III, depression/anxiety, HLD, IDA, macular degeneration, prediabetes.  TEST/EVENTS:  02/24/2012 CT chest without contrast: Mediastinal lymph nodes measure up to 12 mm in the precarinal station, as before.  Moderate hiatal hernia.  Trace amount of loculated pleural fluid in posterior medial inferior left hemithorax.  Mild biapical pleural-parenchymal scarring.  Interval progression of bibasilar predominant peribronchovascular nodularity, areas of airspace consolidation and scattered bronchiectasis.  Findings suggestive of MAC. 07/02/2019 CTA chest: Scattered low-attenuation mediastinal and hilar lymph nodes, many subcentimeter.  Largest node is 13 mm precarinal node, stable.  Airways diffusely thickened and many towards the bases appear fluid-filled with some chronic architectural distortion and scarring in LLL, lingula and RML.  Increasing consolidative opacities present in right lung base.  Additional patchy consolidation with tree-in-bud nodularity is present in the right upper lobe as well.  Some mosaic attenuation may reflect further small airways disease or air trapping.  No PE.  Large hiatal hernia. 11/15/2018 CXR 1 view: Unchanged chronic increased opacity at the left lung base, likely atelectasis.  No new acute abnormality. 01/04/2021 CXR 2 View: large hiatal hernia. Lungs clear without acute  process.   10/13/2020: OV of [name].  Previously managed with rotating antibiotics (azithromycin and Omnicef), chronic prednisone 10 mg daily, scheduled albuterol nebs with chest vest for secretion clearance.  Stopped Omnicef and continue daily azithromycin 220m and 10 mg prednisone.  Continue albuterol nebs twice a day and chest vest therapy.  01/04/2021: OV with Jamisen Hawes NP.  Worsening cough with increased sputum production.  Slight increase in shortness of breath as well.  After visit was completed, notified that patient tested positive for COVID at facility.  Treated with antiviral, increased prednisone temporarily; Mucinex and hypertonic saline nebs.  BNP obtained d/t frothy sputum - nl.   01/18/2021: OV with Dr BLamonte Sakai Continued to feel poorly with persistent fatigue post COVID. Advised it can take some time to regain strength and recover. Continued regimen of azithromycin, prednisone, albuterol nebs and chest vest.   02/19/2021: Today - follow up Patient presents today with daughter for follow up. She tested positive for COVID late December and in January, was still feeling fatigued and had some depression. She reports today that she is slowly feeling a little bit better; however, she has not returned to her baseline. She recently fell and is recovering from that as well. From a respiratory standpoint, she has not had any shortness of breath. Her cough is minimal and has returned to her baseline. She does have some soreness on the side she fell on. She denies wheezing, orthopnea, PND, chest pain, hemoptysis, or lower extremity swelling. She continues doing her albuterol nebs twice daily with vest therapy. She continues on azithromycin and daily prednisone.  Allergies  Allergen Reactions   Codeine Anaphylaxis   Hydrocodone Nausea And Vomiting and Other (See Comments)    SYNCOPE AND BRADYCARDIA Other reaction(s): Unknown   Isosorbide Other (See Comments)  BRADYCARDIA   Isosorbide Nitrate Other  (See Comments)    BRADYCARDIA   Oxycodone Palpitations    Rapid heart beat Other reaction(s): vomiting   Acetaminophen-Codeine Other (See Comments)    Other reaction(s): angioedema Other reaction(s): angioedema   Amoxicillin-Pot Clavulanate Diarrhea    Other reaction(s): watery incontinent diarrhea   Butalbital-Aspirin-Caffeine Swelling and Other (See Comments)    Facial swelling  Other reaction(s): angioedema   Clarithromycin Nausea And Vomiting    Has patient had a PCN reaction causing immediate rash, facial/tongue/throat swelling, SOB or lightheadedness with hypotension: Unknown Has patient had a PCN reaction causing severe rash involving mucus membranes or skin necrosis: Unknown Has patient had a PCN reaction that required hospitalization: Unknown Has patient had a PCN reaction occurring within the last 10 years: Unknown If all of the above answers are "NO", then may proceed with Cephalosporin use.  Other reaction(s): vomiting   Levofloxacin Nausea And Vomiting and Other (See Comments)    SYNCOPE ALSO Other reaction(s): vomiting   Doxycycline Hyclate     Other reaction(s): Unknown   Hydrocodone-Acetaminophen    Isosorbide Dinitrate    Doxycycline Other (See Comments)    Sweats and aches    Immunization History  Administered Date(s) Administered   Fluad Quad(high Dose 65+) 10/08/2018, 10/11/2019, 10/13/2020   Influenza Split 10/15/2011, 10/14/2012, 09/15/2013, 10/29/2016, 10/08/2018, 10/11/2019   Influenza Whole 10/17/2008, 11/14/2009, 11/13/2010   Influenza, High Dose Seasonal PF 11/21/2015, 10/29/2016, 09/22/2017   Influenza,inj,Quad PF,6+ Mos 09/17/2013, 10/07/2014   Influenza,inj,quad, With Preservative 11/08/2014   Influenza-Unspecified 10/13/2020   Moderna Covid-19 Vaccine Bivalent Booster 56yr & up 09/21/2020   Moderna Sars-Covid-2 Vaccination 02/04/2019, 03/04/2019, 10/15/2019, 06/07/2020   PPD Test 03/28/2014   Pneumococcal Conjugate-13 04/07/2014    Pneumococcal Polysaccharide-23 02/19/1999   Tdap 09/21/2012    Past Medical History:  Diagnosis Date   Allergic rhinitis    Anxiety    Anxiety attacks   Arthritis    "a little; in my back and hands" (04/15/2012)   Asthma    Bronchiectasis    with history of Legionaires with chronic rales/rhonchi   Chest pain at rest    Daily headache    "over the last week" 04/15/2012    Depression    Diastolic dysfunction    a. per echo 08/2010 with normal LV function;  b. 06/2011 Echo: EF 65-70%   GERD (gastroesophageal reflux disease)    "just recently" (04/15/2012)   H/O hiatal hernia    H/O Legionnaire's disease 11/1976   History of blood transfusion 1972   "w/hysterectomy" (04/15/2012)   Hypercholesteremia    Hypertension    Negative renal duplex in December of 2012   Hypothyroidism    Idiopathic peripheral neuropathy    MAC (mycobacterium avium-intracellulare complex)    Migraines    "outgrew them" (04/15/2012)   [place] (non[place]ST [place] infarction[place]    a. Normal coronaries per cath August 2012 and negative CT angio for PE;  b. 06/2011 Repeat admission w/ chest pain and elevated troponin's, CTA Chest w/o PE   Osteopenia 02/2011   t score - 2.1   Pneumonia    "recurrent" (04/15/2012)   Pollen allergies    Shortness of breath    "sometimes just lying down" (04/15/2012)   [place] 06/2014   cerebellum    Tobacco History: Social History   Tobacco Use  Smoking Status Never  Smokeless Tobacco Never   Counseling given: Not Answered   Outpatient Medications Prior to Visit  Medication Sig  Dispense Refill   acetaminophen (TYLENOL) 500 MG tablet Take 500 mg by mouth every 6 (six) hours as needed for moderate pain.     acetaminophen (TYLENOL) 650 MG CR tablet      albuterol (PROVENTIL) (2.5 MG/3ML) 0.083% nebulizer solution      ALPRAZolam (XANAX) 0.5 MG tablet Take 1 tablet (0.5 mg total) by mouth as directed. Take 1 tablet (0.5 mg) BID at (8 am & 8 pm) & DAILY PRN FOR  ANXIETY 10 tablet 0   ALPRAZolam (XANAX) 0.5 MG tablet 1 tablet     amLODipine (NORVASC) 2.5 MG tablet Take 2.5 mg by mouth daily.     amLODipine (NORVASC) 5 MG tablet Take 2.5 mg by mouth every evening.     azithromycin (ZITHROMAX) 250 MG tablet TAKE 2 TABLETS BY MOUTH TODAY, THEN TAKE 1 TABLET DAILY FOR 4 DAYS. ALTERNATE EVERY OTHER MONTH WITH CEFDINIR 6 tablet 1   azithromycin (ZITHROMAX) 250 MG tablet      Brinzolamide-Brimonidine (SIMBRINZA) 1-0.2 % SUSP      calcium carbonate (TUMS - DOSED IN MG ELEMENTAL CALCIUM) 500 MG chewable tablet 1 tablet as needed for indigestion or heartburn     carvedilol (COREG) 6.25 MG tablet      clopidogrel (PLAVIX) 75 MG tablet 1 tablet     clopidogrel (PLAVIX) 75 MG tablet      DULoxetine (CYMBALTA) 20 MG capsule duloxetine 20 mg capsule,delayed release  TAKE 1 CAPSULE BY MOUTH THREE TIMES DAILY     DULoxetine (CYMBALTA) 30 MG capsule TAKE 1 CAPSULE     Elastic Bandages & Supports (JOBST ACTIVE 20-30MMHG MEDIUM) MISC See admin instructions.     escitalopram (LEXAPRO) 5 MG tablet Take 1 tablet by mouth daily.     guaiFENesin (MUCINEX) 600 MG 12 hr tablet Take 2 tablets (1,200 mg total) by mouth 2 (two) times daily. 60 tablet 3   Iron-FA-B Cmp-C-Biot-Probiotic (FUSION PLUS) CAPS      latanoprost (XALATAN) 0.005 % ophthalmic solution      levothyroxine (SYNTHROID) 50 MCG tablet 1 tablet in the morning on an empty stomach     levothyroxine (SYNTHROID) 50 MCG tablet      mupirocin ointment (BACTROBAN) 2 % Apply to affected digits once daily. (Patient taking differently: Apply 1 application topically daily as needed (dry skin).) 30 g 1   nitrofurantoin (MACRODANTIN) 100 MG capsule 1 capsule at bedtime with food or milk     NONFORMULARY OR COMPOUNDED ITEM [facility]:  Neuropathy Cream #11 - Bupivacaine 1%, Doxepin 3%, Gabapentin 6%, Pentoxifylline 3%, Topiramate 1%. Apply 1-2 grams to affected areas 3-4 times a day. (Patient taking differently: [facility]:  Neuropathy Cream #11 - Bupivacaine 1%, Doxepin 3%, Gabapentin 6%, Pentoxifylline 3%, Topiramate 1%. Apply 1-2 grams to affected areas 3-4 times a day PRN for Dry Skin) 100 each 5   pantoprazole (PROTONIX) 40 MG tablet Take 1 tablet by mouth 2 (two) times daily.     polyethylene glycol (MIRALAX / GLYCOLAX) 17 g packet Take 17 g by mouth daily as needed for mild constipation. 14 each 0   predniSONE (DELTASONE) 10 MG tablet      ROCKLATAN 0.02-0.005 % SOLN Place 1 drop into both eyes at bedtime.     senna (SENOKOT) 8.6 MG TABS tablet Take 1 tablet (8.6 mg total) by mouth daily. 120 tablet 0   Simethicone (GAS-X PO)      sodium chloride HYPERTONIC 3 % nebulizer solution Take by nebulization 2 (two) times  daily. 75 mL 5   traMADol (ULTRAM) 50 MG tablet Take 1 tablet (50 mg total) by mouth every 6 (six) hours as needed for moderate pain. 30 tablet 0   No facility-administered medications prior to visit.     Review of Systems:   Constitutional: No weight loss or gain, night sweats, fevers, chills. +fatigue (slowly improving) HEENT: No headaches, difficulty swallowing, tooth/dental problems, or sore throat. No sneezing, itching, ear ache, nasal congestion, or post nasal drip CV:  No chest pain, orthopnea, PND, swelling in lower extremities, anasarca, dizziness, palpitations, syncope Resp: +cough, returned to baseline with occasional clear sputum. No shortness of breath with exertion or at rest. No excess mucus or change in color of mucus. No hemoptysis. No wheezing.  No chest wall deformity GI:  No heartburn, indigestion, abdominal pain, nausea, vomiting, diarrhea, change in bowel habits, loss of appetite, bloody stools.  GU: No dysuria, change in color of urine, urgency or frequency.  No flank pain, no hematuria  Skin: No rash, lesions, ulcerations MSK:  No joint pain or swelling.  No decreased range of motion.  No back pain. Neuro: No dizziness or lightheadedness.  Psych: No  depression or anxiety. Mood stable.     Physical Exam:  BP 114/68 (BP Location: Left Arm, Patient Position: Sitting, Cuff Size: Normal)    Pulse (!) 101    Temp 97.7 F (36.5 C) (Oral)    Ht _0  (1.626 m)    Wt 101 lb 9.6 oz (46.1 kg)    LMP 01/14/1970    SpO2 91%    BMI 17.44 kg/m   GEN: Pleasant, interactive, chronically-ill appearing; in no acute distress. HEENT:  Normocephalic and atraumatic. EACs patent bilaterally. TM pearly gray with present light reflex bilaterally. PERRLA. Sclera white. Nasal turbinates pink, moist and patent bilaterally. No rhinorrhea present. Oropharynx pink and moist, without exudate or edema. No lesions, ulcerations, or postnasal drip.  NECK:  Supple w/ fair ROM. No JVD present. Normal carotid impulses w/o bruits. Thyroid symmetrical with no goiter or nodules palpated. No lymphadenopathy.   CV: RRR, no m/r/g, no peripheral edema. Pulses intact, +2 bilaterally. No cyanosis, pallor or clubbing. PULMONARY:  Unlabored, regular breathing. Minimal scattered rhonchi bilaterally posteriorly; otherwise clear. No accessory muscle use. No dullness to percussion. GI: BS present and normoactive. Soft, non-tender to palpation. No organomegaly or masses detected. No CVA tenderness. MSK: No erythema, warmth or tenderness. Cap refil <2 sec all extrem. No deformities or joint swelling noted.  Neuro: A/Ox3. No focal deficits noted.   Skin: Warm, no lesions or rashe Psych: Normal affect and behavior. Judgement and thought content appropriate.     Lab Results:  CBC    Component Value Date/Time   WBC 8.3 01/04/2021 1450   RBC 4.35 01/04/2021 1450   HGB 14.5 01/04/2021 1450   HCT 44.7 01/04/2021 1450   PLT 223.0 01/04/2021 1450   MCV 102.8 (H) 01/04/2021 1450   MCH 32.2 01/26/2020 0332   MCHC 32.3 01/04/2021 1450   RDW 12.9 01/04/2021 1450   LYMPHSABS 1.1 01/04/2021 1450   MONOABS 0.5 01/04/2021 1450   EOSABS 0.0 01/04/2021 1450   BASOSABS 0.0 01/04/2021 1450     BMET    Component Value Date/Time   NA 135 01/04/2021 1450   NA 139 03/25/2017 1216   K 4.1 01/04/2021 1450   CL 98 01/04/2021 1450   CO2 30 01/04/2021 1450   GLUCOSE 140 (H) 01/04/2021 1450   BUN 17 01/04/2021 1450  BUN 14 03/25/2017 1216   CREATININE 0.91 01/04/2021 1450   CALCIUM 9.0 01/04/2021 1450   GFRNONAA >60 01/26/2020 0332   GFRAA >60 07/09/2019 0559    BNP    Component Value Date/Time   BNP 183.9 (H) 07/02/2019 1807     Imaging:  No results found.    No flowsheet data found.  No results found for: NITRICOXIDE      Assessment & Plan:   Bronchiectasis without complication ([place]) Stable today with breathing and cough at baseline. Still experiences some fatigue. Discussed recovery after COVID can be slow, especially with her recent fall Evaluated by PCP afterwards with no significant findings aside from skin tear. She does have home health coming in to assist her in ADLs and household activities. Advised her to notify us if SOB or cough worsen, especially given recent fall.  Patient Instructions  -Continue albuterol nebs 3 mL Twice daily. Can do every 6 hours as needed for shortness of breath or wheezing -Continue azithromycin 250 mg daily -Continue protonix 40 mg Twice daily  -Continue prednisone 10 mg daily -Continue mucinex (guaifenesin) 600 mg twice daily  -Continue hypertonic saline nebulizer 3 mL Twice daily  -Continue flutter valve Twice daily and chest vest therapy at home.   Follow up with [name] as scheduled. If symptoms do not improve or worsen, please contact office for sooner follow up or seek emergency care.      Asthma Stable. No flare in symptoms. Continue with PRN albuterol.  Pseudomonas aeruginosa colonization Continue azithromycin daily. Notify if fevers or worsening, productive cough occurs  GERD (gastroesophageal reflux disease) Well-controlled. Continue PPI therapy.   Clayton Bibles, NP 02/19/2021  Pt aware  and understands NP's role.

## 2021-02-19 NOTE — Assessment & Plan Note (Signed)
Continue azithromycin daily. Notify if fevers or worsening, productive cough occurs

## 2021-02-19 NOTE — Assessment & Plan Note (Signed)
Stable today with breathing and cough at baseline. Still experiences some fatigue. Discussed recovery after COVID can be slow, especially with her recent fall Evaluated by PCP afterwards with no significant findings aside from skin tear. She does have home health coming in to assist her in ADLs and household activities. Advised her to notify us if SOB or cough worsen, especially given recent fall.  Patient Instructions  -Continue albuterol nebs 3 mL Twice daily. Can do every 6 hours as needed for shortness of breath or wheezing -Continue azithromycin 250 mg daily -Continue protonix 40 mg Twice daily  -Continue prednisone 10 mg daily -Continue mucinex (guaifenesin) 600 mg twice daily  -Continue hypertonic saline nebulizer 3 mL Twice daily  -Continue flutter valve Twice daily and chest vest therapy at home.   Follow up with Dr. Delton Coombes as scheduled. If symptoms do not improve or worsen, please contact office for sooner follow up or seek emergency care.

## 2021-02-19 NOTE — Assessment & Plan Note (Signed)
Well-controlled. Continue PPI therapy. °

## 2021-02-19 NOTE — Assessment & Plan Note (Signed)
Stable. No flare in symptoms. Continue with PRN albuterol.

## 2021-02-20 ENCOUNTER — Telehealth: Payer: Self-pay | Admitting: *Deleted

## 2021-02-20 NOTE — Telephone Encounter (Signed)
[facility] [phone]) is calling because patient missed her appointment today, patient stated that her daughter provided wound care the day before, was told to come back later in the week.

## 2021-02-21 ENCOUNTER — Telehealth: Payer: Self-pay | Admitting: *Deleted

## 2021-02-21 NOTE — Telephone Encounter (Signed)
[facility]) [phone] is calling to let the doctor know that patient's daughter has decline PT at this time, patient doesn't need at this time, will continue wound care.

## 2021-02-21 NOTE — Telephone Encounter (Signed)
Called Sarah [name] to notify thru vmessage.

## 2021-02-21 NOTE — Telephone Encounter (Signed)
[facility],[phone]

## 2021-02-22 DIAGNOSIS — I509 Heart failure, unspecified: Secondary | ICD-10-CM | POA: Diagnosis not present

## 2021-02-22 DIAGNOSIS — I872 Venous insufficiency (chronic) (peripheral): Secondary | ICD-10-CM | POA: Diagnosis not present

## 2021-02-22 DIAGNOSIS — G629 Polyneuropathy, unspecified: Secondary | ICD-10-CM | POA: Diagnosis not present

## 2021-02-22 DIAGNOSIS — N189 Chronic kidney disease, unspecified: Secondary | ICD-10-CM | POA: Diagnosis not present

## 2021-02-22 DIAGNOSIS — S81801D Unspecified open wound, right lower leg, subsequent encounter: Secondary | ICD-10-CM | POA: Diagnosis not present

## 2021-02-22 DIAGNOSIS — H9190 Unspecified hearing loss, unspecified ear: Secondary | ICD-10-CM | POA: Diagnosis not present

## 2021-02-27 DIAGNOSIS — I509 Heart failure, unspecified: Secondary | ICD-10-CM | POA: Diagnosis not present

## 2021-02-27 DIAGNOSIS — G629 Polyneuropathy, unspecified: Secondary | ICD-10-CM | POA: Diagnosis not present

## 2021-02-27 DIAGNOSIS — H9190 Unspecified hearing loss, unspecified ear: Secondary | ICD-10-CM | POA: Diagnosis not present

## 2021-02-27 DIAGNOSIS — I872 Venous insufficiency (chronic) (peripheral): Secondary | ICD-10-CM | POA: Diagnosis not present

## 2021-02-27 DIAGNOSIS — N189 Chronic kidney disease, unspecified: Secondary | ICD-10-CM | POA: Diagnosis not present

## 2021-02-27 DIAGNOSIS — S81801D Unspecified open wound, right lower leg, subsequent encounter: Secondary | ICD-10-CM | POA: Diagnosis not present

## 2021-03-01 DIAGNOSIS — G629 Polyneuropathy, unspecified: Secondary | ICD-10-CM | POA: Diagnosis not present

## 2021-03-01 DIAGNOSIS — S81801D Unspecified open wound, right lower leg, subsequent encounter: Secondary | ICD-10-CM | POA: Diagnosis not present

## 2021-03-01 DIAGNOSIS — H9190 Unspecified hearing loss, unspecified ear: Secondary | ICD-10-CM | POA: Diagnosis not present

## 2021-03-01 DIAGNOSIS — I872 Venous insufficiency (chronic) (peripheral): Secondary | ICD-10-CM | POA: Diagnosis not present

## 2021-03-01 DIAGNOSIS — N189 Chronic kidney disease, unspecified: Secondary | ICD-10-CM | POA: Diagnosis not present

## 2021-03-01 DIAGNOSIS — I509 Heart failure, unspecified: Secondary | ICD-10-CM | POA: Diagnosis not present

## 2021-03-03 DIAGNOSIS — Z7902 Long term (current) use of antithrombotics/antiplatelets: Secondary | ICD-10-CM | POA: Diagnosis not present

## 2021-03-03 DIAGNOSIS — I252 Old myocardial infarction: Secondary | ICD-10-CM | POA: Diagnosis not present

## 2021-03-03 DIAGNOSIS — G629 Polyneuropathy, unspecified: Secondary | ICD-10-CM | POA: Diagnosis not present

## 2021-03-03 DIAGNOSIS — H9190 Unspecified hearing loss, unspecified ear: Secondary | ICD-10-CM | POA: Diagnosis not present

## 2021-03-03 DIAGNOSIS — I872 Venous insufficiency (chronic) (peripheral): Secondary | ICD-10-CM | POA: Diagnosis not present

## 2021-03-03 DIAGNOSIS — N189 Chronic kidney disease, unspecified: Secondary | ICD-10-CM | POA: Diagnosis not present

## 2021-03-03 DIAGNOSIS — Z7952 Long term (current) use of systemic steroids: Secondary | ICD-10-CM | POA: Diagnosis not present

## 2021-03-03 DIAGNOSIS — R32 Unspecified urinary incontinence: Secondary | ICD-10-CM | POA: Diagnosis not present

## 2021-03-03 DIAGNOSIS — E039 Hypothyroidism, unspecified: Secondary | ICD-10-CM | POA: Diagnosis not present

## 2021-03-03 DIAGNOSIS — E611 Iron deficiency: Secondary | ICD-10-CM | POA: Diagnosis not present

## 2021-03-03 DIAGNOSIS — G43909 Migraine, unspecified, not intractable, without status migrainosus: Secondary | ICD-10-CM | POA: Diagnosis not present

## 2021-03-03 DIAGNOSIS — I509 Heart failure, unspecified: Secondary | ICD-10-CM | POA: Diagnosis not present

## 2021-03-03 DIAGNOSIS — S81801D Unspecified open wound, right lower leg, subsequent encounter: Secondary | ICD-10-CM | POA: Diagnosis not present

## 2021-03-03 DIAGNOSIS — R1310 Dysphagia, unspecified: Secondary | ICD-10-CM | POA: Diagnosis not present

## 2021-03-03 DIAGNOSIS — E785 Hyperlipidemia, unspecified: Secondary | ICD-10-CM | POA: Diagnosis not present

## 2021-03-04 ENCOUNTER — Other Ambulatory Visit: Payer: Self-pay | Admitting: Nurse Practitioner

## 2021-03-04 DIAGNOSIS — J471 Bronchiectasis with (acute) exacerbation: Secondary | ICD-10-CM

## 2021-03-05 NOTE — Telephone Encounter (Signed)
This rx should not be refilled. If she needs a refill of her daily prednisone, ok to send refill for daily 10 mg prednisone rx. Thanks.

## 2021-03-05 NOTE — Telephone Encounter (Signed)
Please advise patient requesting refill °

## 2021-03-06 ENCOUNTER — Other Ambulatory Visit: Payer: Self-pay

## 2021-03-06 DIAGNOSIS — N189 Chronic kidney disease, unspecified: Secondary | ICD-10-CM | POA: Diagnosis not present

## 2021-03-06 DIAGNOSIS — I872 Venous insufficiency (chronic) (peripheral): Secondary | ICD-10-CM | POA: Diagnosis not present

## 2021-03-06 DIAGNOSIS — G629 Polyneuropathy, unspecified: Secondary | ICD-10-CM | POA: Diagnosis not present

## 2021-03-06 DIAGNOSIS — S81801D Unspecified open wound, right lower leg, subsequent encounter: Secondary | ICD-10-CM | POA: Diagnosis not present

## 2021-03-06 DIAGNOSIS — I509 Heart failure, unspecified: Secondary | ICD-10-CM | POA: Diagnosis not present

## 2021-03-06 DIAGNOSIS — H9190 Unspecified hearing loss, unspecified ear: Secondary | ICD-10-CM | POA: Diagnosis not present

## 2021-03-06 MED ORDER — PREDNISONE 10 MG PO TABS: 10.0000 mg | ORAL_TABLET | Freq: Every day | ORAL | 0 refills | Status: DC

## 2021-03-07 DIAGNOSIS — I872 Venous insufficiency (chronic) (peripheral): Secondary | ICD-10-CM | POA: Diagnosis not present

## 2021-03-07 DIAGNOSIS — S81801D Unspecified open wound, right lower leg, subsequent encounter: Secondary | ICD-10-CM | POA: Diagnosis not present

## 2021-03-07 DIAGNOSIS — H9190 Unspecified hearing loss, unspecified ear: Secondary | ICD-10-CM | POA: Diagnosis not present

## 2021-03-07 DIAGNOSIS — G629 Polyneuropathy, unspecified: Secondary | ICD-10-CM | POA: Diagnosis not present

## 2021-03-07 DIAGNOSIS — I509 Heart failure, unspecified: Secondary | ICD-10-CM | POA: Diagnosis not present

## 2021-03-07 DIAGNOSIS — N189 Chronic kidney disease, unspecified: Secondary | ICD-10-CM | POA: Diagnosis not present

## 2021-03-09 NOTE — Telephone Encounter (Signed)
-----   Message from Freddie Breech, DPM sent at 03/09/2021 12:27 PM EST ----- Patient needs follow up appointment for right leg skin tear/wound sustained at last clinic visit with me. Was scheduled with Dr. Samuella Cota, but canceled January 31st visit. Wound is being dressed by [facility]. Please schedule appts through her daughter. Do not call patient. Thanks.

## 2021-03-09 NOTE — Telephone Encounter (Signed)
Spoke with daughter Sharyl Nimrod - she doesn't feel that the patient needs to be brought in right now.  She feels that [facility] is doing a great job of taking care of the wound.

## 2021-03-13 DIAGNOSIS — G629 Polyneuropathy, unspecified: Secondary | ICD-10-CM | POA: Diagnosis not present

## 2021-03-13 DIAGNOSIS — H9190 Unspecified hearing loss, unspecified ear: Secondary | ICD-10-CM | POA: Diagnosis not present

## 2021-03-13 DIAGNOSIS — I872 Venous insufficiency (chronic) (peripheral): Secondary | ICD-10-CM | POA: Diagnosis not present

## 2021-03-13 DIAGNOSIS — I509 Heart failure, unspecified: Secondary | ICD-10-CM | POA: Diagnosis not present

## 2021-03-13 DIAGNOSIS — S81801D Unspecified open wound, right lower leg, subsequent encounter: Secondary | ICD-10-CM | POA: Diagnosis not present

## 2021-03-13 DIAGNOSIS — N189 Chronic kidney disease, unspecified: Secondary | ICD-10-CM | POA: Diagnosis not present

## 2021-03-15 DIAGNOSIS — G629 Polyneuropathy, unspecified: Secondary | ICD-10-CM | POA: Diagnosis not present

## 2021-03-15 DIAGNOSIS — S81801D Unspecified open wound, right lower leg, subsequent encounter: Secondary | ICD-10-CM | POA: Diagnosis not present

## 2021-03-15 DIAGNOSIS — I872 Venous insufficiency (chronic) (peripheral): Secondary | ICD-10-CM | POA: Diagnosis not present

## 2021-03-15 DIAGNOSIS — I509 Heart failure, unspecified: Secondary | ICD-10-CM | POA: Diagnosis not present

## 2021-03-15 DIAGNOSIS — H9190 Unspecified hearing loss, unspecified ear: Secondary | ICD-10-CM | POA: Diagnosis not present

## 2021-03-15 DIAGNOSIS — N189 Chronic kidney disease, unspecified: Secondary | ICD-10-CM | POA: Diagnosis not present

## 2021-03-20 DIAGNOSIS — I872 Venous insufficiency (chronic) (peripheral): Secondary | ICD-10-CM | POA: Diagnosis not present

## 2021-03-20 DIAGNOSIS — N189 Chronic kidney disease, unspecified: Secondary | ICD-10-CM | POA: Diagnosis not present

## 2021-03-20 DIAGNOSIS — S81801D Unspecified open wound, right lower leg, subsequent encounter: Secondary | ICD-10-CM | POA: Diagnosis not present

## 2021-03-20 DIAGNOSIS — G629 Polyneuropathy, unspecified: Secondary | ICD-10-CM | POA: Diagnosis not present

## 2021-03-20 DIAGNOSIS — H9190 Unspecified hearing loss, unspecified ear: Secondary | ICD-10-CM | POA: Diagnosis not present

## 2021-03-20 DIAGNOSIS — I509 Heart failure, unspecified: Secondary | ICD-10-CM | POA: Diagnosis not present

## 2021-03-21 ENCOUNTER — Other Ambulatory Visit: Payer: Self-pay

## 2021-03-21 ENCOUNTER — Ambulatory Visit (INDEPENDENT_AMBULATORY_CARE_PROVIDER_SITE_OTHER): Payer: Medicare Other | Admitting: Emergency Medicine

## 2021-03-21 ENCOUNTER — Encounter: Payer: Self-pay | Admitting: Emergency Medicine

## 2021-03-21 DIAGNOSIS — J479 Bronchiectasis, uncomplicated: Secondary | ICD-10-CM

## 2021-03-21 MED ORDER — ALBUTEROL SULFATE HFA 108 (90 BASE) MCG/ACT IN AERS: 2.0000 | INHALATION_SPRAY | Freq: Four times a day (QID) | RESPIRATORY_TRACT | 6 refills | Status: DC | PRN

## 2021-03-21 MED ORDER — ALBUTEROL SULFATE (2.5 MG/3ML) 0.083% IN NEBU: 2.5000 mg | INHALATION_SOLUTION | Freq: Four times a day (QID) | RESPIRATORY_TRACT | 3 refills | Status: DC | PRN

## 2021-03-21 MED ORDER — PREDNISONE 10 MG PO TABS: 10.0000 mg | ORAL_TABLET | Freq: Every day | ORAL | 0 refills | Status: DC

## 2021-03-21 NOTE — Progress Notes (Signed)
? ?  Subjective:  ? ? Patient ID: Sarah [name], female    DOB: [birth year], 88 y.o.   MRN: [id number] ? ?HPI ? ?ROV 01/18/21 --88 year old woman with bronchiectasis, obstructive lung disease and chronic cough.  Colonized with Pseudomonas.  There is an upper airway component, irritation from rhinitis and GERD.  She has been maintained on chronic prednisone 10 mg daily, uses albuterol for shortness of breath and also secretion clearance.  She is on azithromycin daily (used to be on rotating abx).  She uses chest vest intermittently. ?She was COVID-positive 12/21 with some flaring symptoms, treated with increased prednisone for 5 days, antiviral, Mucinex, hypertonic saline and flutter valve.  Today she reports that she is continues to feel bad. She was very fatigued, became depressed as well. She still does not feel back to baseline. Clearing her secretions.  ? ?ROV 03/21/21 --Mrs. Pehl is 28, follows up today for her bronchiectasis, associated obstructive lung disease and chronic cough.  She is on chronic prednisone 10 mg daily, azithromycin daily, chest vest and flutter valve, saline nebulizer.  She uses albuterol for shortness of breath and also secretion clearance.  She was seen 1 month ago, stable at that time. ?Today she reports that she is feeling a bit better, able to clear phlegm but fewer plugs. Uses chest vest most days. Uses flutter occasionally. She was unable to obtain the hypertonic saline nebs, is using the albuterol.  ? ? ? ?Review of Systems ?As above ?  ?   ?Objective:  ? Physical Exam  ?Vitals:  ? 03/21/21 1601  ?BP: 122/68  ?Pulse: 84  ?Temp: 98 ?F (36.7 ?C)  ?TempSrc: Oral  ?SpO2: 91%  ? ? ?GEN: A/Ox3, anxious, tearful ?  ?HEENT:  OP clear ? ?NECK: no stridor ? ?RESP distant, B insp rhonchi, insp squeak B upper zones ? ?CARD:  Regular, no M ? ?[place]: some chronic venous stasis, bilateral ankle edema.  ? ?Neuro: awake, poor hearing, oriented ? ? ? ?   ?Assessment & Plan:  ?Bronchiectasis without  complication ([place]) ?Better.  Feels stronger.  She is clearing secretions.  It seems that the addition of azithromycin has been beneficial.  Plan to continue to follow her closely but will space it out to every 2 months. ? ?Please continue to use your albuterol nebulizer treatment to help with secretion clearance 1-2 times a day. ?We will stop your nebulized hypertonic saline (which we never really started) ?Keep your albuterol inhaler available to use 2 puffs if needed for shortness of breath, chest tightness, wheezing, coughing. ?Continue prednisone 10 mg once daily. ?Continue azithromycin 250 mg once daily. ?Continue to use your chest vest and your flutter valve regularly to help with mucus clearance. ?Follow with APP in 2 months ?Follow with [name] in 4 months. ? ?Baltazar Apo, MD, PhD ?03/21/2021, 4:28 PM ?[facility] ?[phone] or if no answer [phone] ? ?

## 2021-03-21 NOTE — Patient Instructions (Signed)
Please continue to use your albuterol nebulizer treatment to help with secretion clearance 1-2 times a day. ?We will stop your nebulized hypertonic saline (which we never really started) ?Keep your albuterol inhaler available to use 2 puffs if needed for shortness of breath, chest tightness, wheezing, coughing. ?Continue prednisone 10 mg once daily. ?Continue azithromycin 250 mg once daily. ?Continue to use your chest vest and your flutter valve regularly to help with mucus clearance. ?Follow with APP in 2 months ?Follow with [name] in 4 months. ?

## 2021-03-21 NOTE — Assessment & Plan Note (Signed)
Better.  Feels stronger.  She is clearing secretions.  It seems that the addition of azithromycin has been beneficial.  Plan to continue to follow her closely but will space it out to every 2 months. ? ?Please continue to use your albuterol nebulizer treatment to help with secretion clearance 1-2 times a day. ?We will stop your nebulized hypertonic saline (which we never really started) ?Keep your albuterol inhaler available to use 2 puffs if needed for shortness of breath, chest tightness, wheezing, coughing. ?Continue prednisone 10 mg once daily. ?Continue azithromycin 250 mg once daily. ?Continue to use your chest vest and your flutter valve regularly to help with mucus clearance. ?Follow with APP in 2 months ?Follow with [name] in 4 months. ?

## 2021-03-21 NOTE — Addendum Note (Signed)
Addended by: Dorisann Frames R on: 03/21/2021 05:03 PM ? ? Modules accepted: Orders ? ?

## 2021-03-23 DIAGNOSIS — S81801D Unspecified open wound, right lower leg, subsequent encounter: Secondary | ICD-10-CM | POA: Diagnosis not present

## 2021-03-23 DIAGNOSIS — H9190 Unspecified hearing loss, unspecified ear: Secondary | ICD-10-CM | POA: Diagnosis not present

## 2021-03-23 DIAGNOSIS — I509 Heart failure, unspecified: Secondary | ICD-10-CM | POA: Diagnosis not present

## 2021-03-23 DIAGNOSIS — G629 Polyneuropathy, unspecified: Secondary | ICD-10-CM | POA: Diagnosis not present

## 2021-03-23 DIAGNOSIS — I872 Venous insufficiency (chronic) (peripheral): Secondary | ICD-10-CM | POA: Diagnosis not present

## 2021-03-23 DIAGNOSIS — N189 Chronic kidney disease, unspecified: Secondary | ICD-10-CM | POA: Diagnosis not present

## 2021-03-31 DIAGNOSIS — Z20822 Contact with and (suspected) exposure to covid-19: Secondary | ICD-10-CM | POA: Diagnosis not present

## 2021-04-02 ENCOUNTER — Other Ambulatory Visit: Payer: Self-pay | Admitting: Emergency Medicine

## 2021-04-03 DIAGNOSIS — Z20822 Contact with and (suspected) exposure to covid-19: Secondary | ICD-10-CM | POA: Diagnosis not present

## 2021-04-24 ENCOUNTER — Other Ambulatory Visit: Payer: Medicare Other | Admitting: Hospice

## 2021-04-24 DIAGNOSIS — J449 Chronic obstructive pulmonary disease, unspecified: Secondary | ICD-10-CM

## 2021-04-24 DIAGNOSIS — F419 Anxiety disorder, unspecified: Secondary | ICD-10-CM

## 2021-04-24 DIAGNOSIS — I10 Essential (primary) hypertension: Secondary | ICD-10-CM | POA: Diagnosis not present

## 2021-04-24 DIAGNOSIS — Z515 Encounter for palliative care: Secondary | ICD-10-CM

## 2021-04-24 NOTE — Progress Notes (Signed)
? ? ?Manufacturing engineer ?Community Palliative Care Consult Note ?Telephone: [phone]  ?Fax: [phone] ? ?PATIENT NAME: Sarah [name] ?DOB: 12-14-1921 ?MRN: [id number] ? ?PRIMARY CARE PROVIDER:   [name], MD ?[name], MD ?[address] ?[address] ?[place],  [place] [address] ? ?REFERRING PROVIDER: [name], MD ?[name], MD ?[address] ?[address] ?[place],   [address] ? ?RESPONSIBLE PARTY:  Self ?Emergency contact: Sarah [name] [phone] ? ?Sarah [name] is care giver ? ? ? ?RECOMMENDATIONS/PLAN:  ? ?Visit consisted of building trust.  Palliative Medicine as specialized medical care for people living with serious illness, aimed at facilitating better quality of life through symptoms relief, assisting with advance care plan and establishing goals of care. NP called and updated Sarah [name] on visit. She expressed appreciation for the visit/update.  ? ?Code Status: CODE STATUS reviewed.  Patient is a DO NOT RESUSCITATE ? ?Goals of Care: Goals of care include to maximize quality of life and symptom management.  ?Symptom management:  ?COPD: Followed by Pulmonologist. Patient used her Albuterol during visit for shortness of breath - effective. Continue with shaking vest every morning.  Balance of rest and performance activity.  Avoid triggers. ?Cough: r/t to COPD. Clear sputum/phlegm. She has as needed Mucinex for cough.   ?Anxiety: Managed with Xanax.  ?Hypertension: Managed with Amlodipine, Carvedilol.  ?Palliative will continue to monitor for symptom management/decline and make recommendations as needed. ? ?Family /Caregiver/Community Supports:  Patient lives in her independent living. Sarah [name] and other siblings visit often.  Strong family support system identified.  Patient has caregivers that help her with ADLs. ? ? ?HISTORY OF PRESENT ILLNESS:  Sarah [name] is a 88 y.o. year old female with multiple morbidities requiring close monitoring/management with high risk of  complications morbidity: COPD, hypertension, anxiety.  Patient in respiratory distress during visit; received albuterol breathing treatment-effective.  Education on the need to use shaking vest in the mornings as ordered, avoid triggers, and use breathing treatment as ordered. History obtained from review of EMR, discussion with primary team, family and/or patient. Records reviewed and summarized above. All 10 point systems reviewed and are negative except as documented in history of present illness above ? ?Review and summarization of Epic records shows history from other than patient.  ? ?Palliative Care was asked to follow this patient o help address complex decision making in the context of advance care planning and goals of care clarification.  ?CODE STATUS: DNR ? ? ?HOSPICE ELIGIBILITY/DIAGNOSIS: TBD ? ?PHYSICAL EXAM  ?Height/Weight: 5 feet 4 inches/101 Ibs ?General: In no acute distress, appropriately dressed ?Cardiovascular: regular rate and rhythm; no edema in BLE.  ?Pulmonary: productive cough with clear sputum, no increased work of breathing, normal respiratory effort after albuterol breathing treatment ?Abdomen: soft, non tender, no guarding, positive bowel sounds in all quadrants ?GU:  no suprapubic tenderness ?Eyes: Normal lids, no discharge ?ENMT: Moist mucous membranes ?Musculoskeletal:  weakness, sarcopenia, ambulatory with rolling walker ?Skin: no rash to visible skin, warm without cyanosis,  ?Psych: non-anxious affect ?Neurological: Weakness but otherwise non focal, mild memory loss ?Heme/lymph/immuno: no bruises, no bleeding ? ?PAST MEDICAL HISTORY:  ?Past Medical History:  ?Diagnosis Date  ? Allergic rhinitis   ? Anxiety   ? Anxiety attacks  ? Arthritis   ? "a little; in my back and hands" (04/15/2012)  ? Asthma   ? Bronchiectasis   ? with history of Legionaires with chronic rales/rhonchi  ? Chest pain at rest   ? Daily headache   ? "over  the last week" 04/15/2012   ? Depression   ? Diastolic  dysfunction   ? a. per echo 08/2010 with normal LV function;  b. 06/2011 Echo: EF 65-70%  ? GERD (gastroesophageal reflux disease)   ? "just recently" (04/15/2012)  ? H/O hiatal hernia   ? H/O Legionnaire's disease 11/1976  ? History of blood transfusion 1972  ? "w/hysterectomy" (04/15/2012)  ? Hypercholesteremia   ? Hypertension   ? Negative renal duplex in December of 2012  ? Hypothyroidism   ? Idiopathic peripheral neuropathy   ? MAC (mycobacterium avium-intracellulare complex)   ? Migraines   ? "outgrew them" (04/15/2012)  ? NSTEMI (non-ST elevated myocardial infarction) ([facility])   ? a. Normal coronaries per cath August 2012 and negative CT angio for PE;  b. 06/2011 Repeat admission w/ chest pain and elevated troponin's, CTA Chest w/o PE  ? Osteopenia 02/2011  ? t score - 2.1  ? Pneumonia   ? "recurrent" (04/15/2012)  ? Pollen allergies   ? Shortness of breath   ? "sometimes just lying down" (04/15/2012)  ? Stroke [facility]) 06/2014  ? cerebellum  ?  ?SOCIAL HX:  ?Social History  ? ?Tobacco Use  ? Smoking status: Never  ? Smokeless tobacco: Never  ?Substance Use Topics  ? Alcohol use: Not Currently  ? ? ?ALLERGIES:  ?Allergies  ?Allergen Reactions  ? Codeine Anaphylaxis  ? Hydrocodone Nausea And Vomiting and Other (See Comments)  ?  SYNCOPE AND BRADYCARDIA ?Other reaction(s): Unknown  ? Isosorbide Other (See Comments)  ?  BRADYCARDIA  ? Isosorbide Nitrate Other (See Comments)  ?  BRADYCARDIA  ? Oxycodone Palpitations  ?  Rapid heart beat ?Other reaction(s): vomiting  ? Acetaminophen-Codeine Other (See Comments)  ?  Other reaction(s): angioedema ?Other reaction(s): angioedema  ? Amoxicillin-Pot Clavulanate Diarrhea  ?  Other reaction(s): watery incontinent diarrhea  ? Butalbital-Aspirin-Caffeine Swelling and Other (See Comments)  ?  Facial swelling ? ?Other reaction(s): angioedema  ? Clarithromycin Nausea And Vomiting  ?  Has patient had a PCN reaction causing immediate rash, facial/tongue/throat swelling, SOB or lightheadedness with  hypotension: Unknown ?Has patient had a PCN reaction causing severe rash involving mucus membranes or skin necrosis: Unknown ?Has patient had a PCN reaction that required hospitalization: Unknown ?Has patient had a PCN reaction occurring within the last 10 years: Unknown ?If all of the above answers are "NO", then may proceed with Cephalosporin use. ? ?Other reaction(s): vomiting  ? Levofloxacin Nausea And Vomiting and Other (See Comments)  ?  SYNCOPE ALSO ?Other reaction(s): vomiting  ? Doxycycline Hyclate   ?  Other reaction(s): Unknown  ? Hydrocodone-Acetaminophen   ? Isosorbide Dinitrate   ? Doxycycline Other (See Comments)  ?  Sweats and aches  ?   ?PERTINENT MEDICATIONS:  ?Outpatient Encounter Medications as of 04/24/2021  ?Medication Sig  ? acetaminophen (TYLENOL) 500 MG tablet Take 500 mg by mouth every 6 (six) hours as needed for moderate pain.  ? acetaminophen (TYLENOL) 650 MG CR tablet   ? albuterol (PROVENTIL) (2.5 MG/3ML) 0.083% nebulizer solution Take 3 mLs (2.5 mg total) by nebulization every 6 (six) hours as needed for wheezing or shortness of breath.  ? albuterol (VENTOLIN HFA) 108 (90 Base) MCG/ACT inhaler Inhale 2 puffs into the lungs every 6 (six) hours as needed for wheezing or shortness of breath.  ? ALPRAZolam (XANAX) 0.5 MG tablet Take 1 tablet (0.5 mg total) by mouth as directed. Take 1 tablet (0.5 mg) BID at (8 am & 8  pm) & DAILY PRN FOR ANXIETY  ? ALPRAZolam (XANAX) 0.5 MG tablet 1 tablet  ? amLODipine (NORVASC) 2.5 MG tablet Take 2.5 mg by mouth daily.  ? amLODipine (NORVASC) 5 MG tablet Take 2.5 mg by mouth every evening.  ? azithromycin (ZITHROMAX) 250 MG tablet TAKE 2 TABLETS BY MOUTH TODAY, THEN TAKE 1 TABLET DAILY FOR 4 DAYS. ALTERNATE EVERY OTHER MONTH WITH CEFDINIR  ? azithromycin (ZITHROMAX) 250 MG tablet   ? azithromycin (ZITHROMAX) 250 MG tablet TAKE 1 TABLET BY MOUTH EVERY DAY  ? Brinzolamide-Brimonidine (SIMBRINZA) 1-0.2 % SUSP   ? calcium carbonate (TUMS - DOSED IN MG  ELEMENTAL CALCIUM) 500 MG chewable tablet 1 tablet as needed for indigestion or heartburn  ? carvedilol (COREG) 6.25 MG tablet   ? clopidogrel (PLAVIX) 75 MG tablet 1 tablet  ? clopidogrel (PLAVIX) 75 MG tablet   ? DU

## 2021-04-25 DIAGNOSIS — R059 Cough, unspecified: Secondary | ICD-10-CM | POA: Diagnosis not present

## 2021-04-25 DIAGNOSIS — Z20822 Contact with and (suspected) exposure to covid-19: Secondary | ICD-10-CM | POA: Diagnosis not present

## 2021-04-25 DIAGNOSIS — R051 Acute cough: Secondary | ICD-10-CM | POA: Diagnosis not present

## 2021-04-30 DIAGNOSIS — Z20822 Contact with and (suspected) exposure to covid-19: Secondary | ICD-10-CM | POA: Diagnosis not present

## 2021-05-01 ENCOUNTER — Ambulatory Visit (INDEPENDENT_AMBULATORY_CARE_PROVIDER_SITE_OTHER): Payer: Medicare Other | Admitting: Podiatry

## 2021-05-01 DIAGNOSIS — M79674 Pain in right toe(s): Secondary | ICD-10-CM | POA: Diagnosis not present

## 2021-05-01 DIAGNOSIS — M79675 Pain in left toe(s): Secondary | ICD-10-CM | POA: Diagnosis not present

## 2021-05-01 DIAGNOSIS — I739 Peripheral vascular disease, unspecified: Secondary | ICD-10-CM

## 2021-05-01 DIAGNOSIS — B351 Tinea unguium: Secondary | ICD-10-CM

## 2021-05-01 DIAGNOSIS — Q828 Other specified congenital malformations of skin: Secondary | ICD-10-CM | POA: Diagnosis not present

## 2021-05-06 ENCOUNTER — Other Ambulatory Visit: Payer: Self-pay | Admitting: Emergency Medicine

## 2021-05-08 DIAGNOSIS — H401122 Primary open-angle glaucoma, left eye, moderate stage: Secondary | ICD-10-CM | POA: Diagnosis not present

## 2021-05-10 DIAGNOSIS — I10 Essential (primary) hypertension: Secondary | ICD-10-CM | POA: Diagnosis not present

## 2021-05-10 DIAGNOSIS — I639 Cerebral infarction, unspecified: Secondary | ICD-10-CM | POA: Diagnosis not present

## 2021-05-10 DIAGNOSIS — F339 Major depressive disorder, recurrent, unspecified: Secondary | ICD-10-CM | POA: Diagnosis not present

## 2021-05-10 DIAGNOSIS — I2510 Atherosclerotic heart disease of native coronary artery without angina pectoris: Secondary | ICD-10-CM | POA: Diagnosis not present

## 2021-05-10 DIAGNOSIS — R54 Age-related physical debility: Secondary | ICD-10-CM | POA: Diagnosis not present

## 2021-05-10 DIAGNOSIS — E039 Hypothyroidism, unspecified: Secondary | ICD-10-CM | POA: Diagnosis not present

## 2021-05-10 NOTE — Progress Notes (Signed)
?Subjective:  ?Patient ID: Sarah [name], female    DOB: Jun 07, 1921,  MRN: [id number] ? ?[name] presents to clinic today for for at risk foot care. Patient has h/o PAD and at risk foot care. Patient has history of painful mycotic toenails. She is also here for f/u of skin tear of the right shin sustained on her last visit. Ms. Trott is accompanied by caregiver, KP. They state skin tear has completely healed. ? ?New problem(s): None.  ? ?PCP is [name], MD , and last visit was February 14, 2021. ? ?Allergies  ?Allergen Reactions  ? Codeine Anaphylaxis  ? Hydrocodone Nausea And Vomiting and Other (See Comments)  ?  SYNCOPE AND BRADYCARDIA ?Other reaction(s): Unknown  ? Isosorbide Other (See Comments)  ?  BRADYCARDIA  ? Isosorbide Nitrate Other (See Comments)  ?  BRADYCARDIA  ? Oxycodone Palpitations  ?  Rapid heart beat ?Other reaction(s): vomiting  ? Acetaminophen-Codeine Other (See Comments)  ?  Other reaction(s): angioedema ?Other reaction(s): angioedema ?Other reaction(s): angioedema  ? Amoxicillin-Pot Clavulanate Diarrhea  ?  Other reaction(s): watery incontinent diarrhea  ? Butalbital-Aspirin-Caffeine Swelling and Other (See Comments)  ?  Facial swelling ? ?Other reaction(s): angioedema  ? Clarithromycin Nausea And Vomiting  ?  Has patient had a PCN reaction causing immediate rash, facial/tongue/throat swelling, SOB or lightheadedness with hypotension: Unknown ?Has patient had a PCN reaction causing severe rash involving mucus membranes or skin necrosis: Unknown ?Has patient had a PCN reaction that required hospitalization: Unknown ?Has patient had a PCN reaction occurring within the last 10 years: Unknown ?If all of the above answers are "NO", then may proceed with Cephalosporin use. ? ?Other reaction(s): vomiting  ? Levofloxacin Nausea And Vomiting and Other (See Comments)  ?  SYNCOPE ALSO ?Other reaction(s): vomiting  ? Doxycycline Hyclate   ?  Other reaction(s): Unknown  ?  Hydrocodone-Acetaminophen   ? Isosorbide Dinitrate   ? Doxycycline Other (See Comments)  ?  Sweats and aches  ? ? ?Review of Systems: Negative except as noted in the HPI. ? ?Objective:  ?Constitutional Sarah [name] is a pleasant 88 y.o. Caucasian female, thin build in NAD. AAO x 3.   ?Vascular Capillary fill time to digits <3 seconds b/l lower extremities. Faintly palpable DP pulse(s) b/l lower extremities. Nonpalpable PT pulse(s) b/l lower extremities. Pedal hair absent. Lower extremity skin temperature gradient warm to cool. No pain with calf compression b/l. Trace edema noted b/l lower extremities. No cyanosis or clubbing noted.  ?Neurologic Normal speech. Oriented to person, place, and time. Pt has subjective symptoms of neuropathy. Protective sensation intact 5/5 intact bilaterally with 10g monofilament b/l.  ?Dermatologic Pedal skin is thin shiny, atrophic b/l lower extremities. No open wounds b/l lower extremities. No interdigital macerations b/l lower extremities. Toenails 1-5 b/l elongated, discolored, dystrophic, thickened, crumbly with subungual debris and tenderness to dorsal palpation. Hyperkeratotic lesion(s) R 2nd toe.  No erythema, no edema, no drainage, no fluctuance. Porokeratotic lesion(s)  L 4th and R 5th toe. No erythema, no edema, no drainage, no fluctuance.  ?Orthopedic: Normal muscle strength 5/5 to all lower extremity muscle groups bilaterally. Severe hammertoe deformity noted 2-5 bilaterally.  ? ?Radiographs: None ? ?Assessment/Plan: ?1. Pain due to onychomycosis of toenails of both feet   ?2. Porokeratosis   ?3. PAD (peripheral artery disease) (HCC)   ?  ?-Patient was evaluated and treated. All patient's and/or POA's questions/concerns answered on today's visit. ?-Skin tear right leg completely healed. ?-Mycotic toenails 1-5 bilaterally were debrided in  length and girth with sterile nail nippers and dremel without incident. ?-Corn(s) R 2nd toe pared utilizing sterile scalpel blade  without complication or incident. Total number debrided=1. ?-Painful porokeratotic lesion(s) L 4th toe and R 5th toe pared and enucleated with sterile scalpel blade without incident. Total number of lesions debrided=2. ?-Patient/POA to call should there be question/concern in the interim.  ? ?Return in about 3 months (around 07/31/2021). ? ?Freddie Breech, DPM  ?

## 2021-05-14 DIAGNOSIS — D509 Iron deficiency anemia, unspecified: Secondary | ICD-10-CM | POA: Diagnosis not present

## 2021-05-14 DIAGNOSIS — E440 Moderate protein-calorie malnutrition: Secondary | ICD-10-CM | POA: Diagnosis not present

## 2021-05-14 DIAGNOSIS — J479 Bronchiectasis, uncomplicated: Secondary | ICD-10-CM | POA: Diagnosis not present

## 2021-05-14 DIAGNOSIS — I959 Hypotension, unspecified: Secondary | ICD-10-CM | POA: Diagnosis not present

## 2021-05-14 DIAGNOSIS — F411 Generalized anxiety disorder: Secondary | ICD-10-CM | POA: Diagnosis not present

## 2021-05-14 DIAGNOSIS — R4789 Other speech disturbances: Secondary | ICD-10-CM | POA: Diagnosis not present

## 2021-05-14 DIAGNOSIS — F321 Major depressive disorder, single episode, moderate: Secondary | ICD-10-CM | POA: Diagnosis not present

## 2021-05-19 DIAGNOSIS — Z20822 Contact with and (suspected) exposure to covid-19: Secondary | ICD-10-CM | POA: Diagnosis not present

## 2021-05-21 ENCOUNTER — Encounter: Payer: Self-pay | Admitting: Nurse Practitioner

## 2021-05-21 ENCOUNTER — Ambulatory Visit (INDEPENDENT_AMBULATORY_CARE_PROVIDER_SITE_OTHER): Payer: Medicare Other | Admitting: Nurse Practitioner

## 2021-05-21 ENCOUNTER — Ambulatory Visit (INDEPENDENT_AMBULATORY_CARE_PROVIDER_SITE_OTHER): Payer: Medicare Other

## 2021-05-21 VITALS — BP 110/60 | HR 100 | Ht 64.0 in | Wt 97.8 lb

## 2021-05-21 DIAGNOSIS — J479 Bronchiectasis, uncomplicated: Secondary | ICD-10-CM | POA: Diagnosis not present

## 2021-05-21 DIAGNOSIS — J471 Bronchiectasis with (acute) exacerbation: Secondary | ICD-10-CM

## 2021-05-21 DIAGNOSIS — K449 Diaphragmatic hernia without obstruction or gangrene: Secondary | ICD-10-CM | POA: Diagnosis not present

## 2021-05-21 DIAGNOSIS — J45909 Unspecified asthma, uncomplicated: Secondary | ICD-10-CM | POA: Diagnosis not present

## 2021-05-21 DIAGNOSIS — Z20822 Contact with and (suspected) exposure to covid-19: Secondary | ICD-10-CM | POA: Diagnosis not present

## 2021-05-21 DIAGNOSIS — R059 Cough, unspecified: Secondary | ICD-10-CM | POA: Diagnosis not present

## 2021-05-21 MED ORDER — PREDNISONE 10 MG PO TABS: 10.0000 mg | ORAL_TABLET | Freq: Every day | ORAL | 1 refills | Status: DC

## 2021-05-21 MED ORDER — ALBUTEROL SULFATE (2.5 MG/3ML) 0.083% IN NEBU: 2.5000 mg | INHALATION_SOLUTION | Freq: Four times a day (QID) | RESPIRATORY_TRACT | 3 refills | Status: DC | PRN

## 2021-05-21 MED ORDER — AZITHROMYCIN 250 MG PO TABS: 250.0000 mg | ORAL_TABLET | Freq: Every day | ORAL | 1 refills | Status: DC

## 2021-05-21 NOTE — Assessment & Plan Note (Addendum)
Mild bronchiectatic flare. Pt adamant about not wanting to add any addition medications at this point or make changes to her current regimen. I did advise that she restart Mucinex, at minimum, to help with mucociliary clearance and she agreed. Advised CXR to ensure no superimposed infection. Counseled to notify us if she does not improve or worsens - would do a short burst of prednisone then return to daily. Verbalized understanding. EKG for medication monitoring today with nl QTc ? ?Patient Instructions  ?-Continue albuterol nebs 3 mL Twice daily. Can do every 6 hours as needed for shortness of breath or wheezing ?-Continue azithromycin 250 mg daily ?-Continue protonix 40 mg Twice daily  ?-Continue prednisone 10 mg daily ?-Continue flutter valve Twice daily and chest vest therapy at home. ? ?-Restart mucinex (guaifenesin) 600 mg Twice daily  ?  ?Follow up with Dr. Delton Coombes as scheduled. If symptoms do not improve or worsen, please contact office for sooner follow up or seek emergency care. ? ? ?

## 2021-05-21 NOTE — Progress Notes (Signed)
Please notify pt/pt's daughter [facility]) that her CXR showed stable chronic changes and no evidence of pneumonia or other acute process. Thanks.

## 2021-05-21 NOTE — Progress Notes (Signed)
@Patient  ID: Sarah [name], female    DOB: 08-13-1921, 88 y.o.   MRN: [id number]  Chief Complaint  Patient presents with   Follow-up    Bronchiectasis, feeling better from covid symptoms     Referring provider: [name], MD  HPI: 88 year old female, never smoker followed for asthma, bronchiectasis with pseudomonal colonization, allergic rhinitis, chronic cough.  She is a patient of Dr. Kavin [name] and was last seen in office on 03/21/2021.  Past medical history significant for hypertension, NSTEMI, stroke, CAD, chronic diastolic CHF, hypothyroidism, arthritis, adrenal insufficiency, CKD stage III, GERD, depression/anxiety, HLD, IDA, macular degeneration, prediabetes.  TEST/EVENTS:  02/24/2012 CT chest without contrast: Mediastinal lymph nodes measure up to 12 mm in the precarinal station, as before.  Moderate hiatal hernia.  Trace amount of loculated pleural fluid in posterior medial inferior left hemithorax.  Mild biapical pleural-parenchymal scarring.  Interval progression of bibasilar predominant peribronchovascular nodularity, areas of airspace consolidation and scattered bronchiectasis.  Findings suggestive of MAC. 07/02/2019 CTA chest: Scattered low-attenuation mediastinal and hilar lymph nodes, many subcentimeter.  Largest node is 13 mm precarinal node, stable.  Airways diffusely thickened and many towards the bases appear fluid-filled with some chronic architectural distortion and scarring in LLL, lingula and RML.  Increasing consolidative opacities present in right lung base.  Additional patchy consolidation with tree-in-bud nodularity is present in the right upper lobe as well.  Some mosaic attenuation may reflect further small airways disease or air trapping.  No PE.  Large hiatal hernia. 11/15/2018 CXR 1 view: Unchanged chronic increased opacity at the left lung base, likely atelectasis.  No new acute abnormality. 01/04/2021 CXR 2 View: large hiatal hernia. Lungs clear without acute  process.   10/13/2020: OV of Dr. Delton [name].  Previously managed with rotating antibiotics (azithromycin and Omnicef), chronic prednisone 10 mg daily, scheduled albuterol nebs with chest vest for secretion clearance.  Stopped Omnicef and continue daily azithromycin 250mg  and 10 mg prednisone.  Continue albuterol nebs twice a day and chest vest therapy.   01/04/2021: OV with Sarah Pell NP.  Worsening cough with increased sputum production.  Slight increase in shortness of breath as well.  After visit was completed, notified that patient tested positive for COVID at facility.  Treated with antiviral, increased prednisone temporarily; Mucinex and hypertonic saline nebs.  BNP obtained d/t frothy sputum - nl.    01/18/2021: OV with Dr Sarah [name]. Continued to feel poorly with persistent fatigue post COVID. Advised it can take some time to regain strength and recover. Continued regimen of azithromycin, prednisone, albuterol nebs and chest vest.   02/19/2021: OV with Sarah Urbanik NP. Slowly recovering after COVID infection. Had a recent fall - worked up by PCP and recovering well. Breathing stable with minimal cough. Continued on albuterol nebs Twice daily, daily azithro and daily prednisone. Continued mucociliary clearance therapies.   03/21/2021: OV with Dr. Delton [name]. No concerns or complaints. Feeling better and stronger. Clearing secretions easily. Azithromycin has helped tremendously. Continued daily. No changes to regimen. Never started hypertonic nebs - ok to hold off.   05/21/2021: Today - follow up Patient presents today with daughter for follow-up.  She reports that she was doing well; however has had an increase in her cough frequency and production over the last few days.  Describes her sputum production as yellow to brown.  She has also had some occasional bouts of increased shortness of breath, resolved with albuterol neb.  Denies any wheezing, fevers, fatigue, anorexia, recent weight loss.  She is currently using albuterol  nebs  1-2 times a day.  Maintained on azithromycin daily and Pred 10 mg prednisone daily.  She is using chest vest once a day and flutter valve 2-3 times a day.  She is not currently taking Mucinex.  She does not wish to add on any other medicines at this time.  Feels like she does not feel that bad and does not want to add anything else.  Allergies  Allergen Reactions   Codeine Anaphylaxis   Hydrocodone Nausea And Vomiting and Other (See Comments)    SYNCOPE AND BRADYCARDIA Other reaction(s): Unknown   Isosorbide Other (See Comments)    BRADYCARDIA   Isosorbide Nitrate Other (See Comments)    BRADYCARDIA   Oxycodone Palpitations    Rapid heart beat Other reaction(s): vomiting   Acetaminophen-Codeine Other (See Comments)    Other reaction(s): angioedema Other reaction(s): angioedema Other reaction(s): angioedema   Amoxicillin-Pot Clavulanate Diarrhea    Other reaction(s): watery incontinent diarrhea   Butalbital-Aspirin-Caffeine Swelling and Other (See Comments)    Facial swelling  Other reaction(s): angioedema   Clarithromycin Nausea And Vomiting    Has patient had a PCN reaction causing immediate rash, facial/tongue/throat swelling, SOB or lightheadedness with hypotension: Unknown Has patient had a PCN reaction causing severe rash involving mucus membranes or skin necrosis: Unknown Has patient had a PCN reaction that required hospitalization: Unknown Has patient had a PCN reaction occurring within the last 10 years: Unknown If all of the above answers are "NO", then may proceed with Cephalosporin use.  Other reaction(s): vomiting   Levofloxacin Nausea And Vomiting and Other (See Comments)    SYNCOPE ALSO Other reaction(s): vomiting   Doxycycline Hyclate     Other reaction(s): Unknown   Hydrocodone-Acetaminophen    Isosorbide Dinitrate    Doxycycline Other (See Comments)    Sweats and aches    Immunization History  Administered Date(s) Administered   Fluad Quad(high Dose 65+)  10/08/2018, 10/11/2019, 10/13/2020   Influenza Split 10/15/2011, 10/14/2012, 09/15/2013, 10/29/2016, 10/08/2018, 10/11/2019   Influenza Whole 10/17/2008, 11/14/2009, 11/13/2010   Influenza, High Dose Seasonal PF 11/21/2015, 10/29/2016, 09/22/2017   Influenza,inj,Quad PF,6+ Mos 09/17/2013, 10/07/2014   Influenza,inj,quad, With Preservative 11/08/2014   Influenza-Unspecified 10/13/2020   Moderna Covid-19 Vaccine Bivalent Booster 88yrs & up 09/21/2020   Moderna Sars-Covid-2 Vaccination 02/04/2019, 03/04/2019, 10/15/2019, 06/07/2020   PPD Test 03/28/2014   Pneumococcal Conjugate-13 04/07/2014   Pneumococcal Polysaccharide-23 02/19/1999   Tdap 09/21/2012    Past Medical History:  Diagnosis Date   Allergic rhinitis    Anxiety    Anxiety attacks   Arthritis    "a little; in my back and hands" (04/15/2012)   Asthma    Bronchiectasis    with history of Legionaires with chronic rales/rhonchi   Chest pain at rest    Daily headache    "over the last week" 04/15/2012    Depression    Diastolic dysfunction    a. per echo 08/2010 with normal LV function;  b. 06/2011 Echo: EF 65-70%   GERD (gastroesophageal reflux disease)    "just recently" (04/15/2012)   H/O hiatal hernia    H/O Legionnaire's disease 11/1976   History of blood transfusion 1972   "w/hysterectomy" (04/15/2012)   Hypercholesteremia    Hypertension    Negative renal duplex in December of 2012   Hypothyroidism    Idiopathic peripheral neuropathy    MAC (mycobacterium avium-intracellulare complex)    Migraines    "outgrew them" (04/15/2012)   NSTEMI (non-ST elevated myocardial infarction) (HCC)  a. Normal coronaries per cath August 2012 and negative CT angio for PE;  b. 06/2011 Repeat admission w/ chest pain and elevated troponin's, CTA Chest w/o PE   Osteopenia 02/2011   t score - 2.1   Pneumonia    "recurrent" (04/15/2012)   Pollen allergies    Shortness of breath    "sometimes just lying down" (04/15/2012)   Stroke (HCC) 06/2014    cerebellum    Tobacco History: Social History   Tobacco Use  Smoking Status Never  Smokeless Tobacco Never   Counseling given: Not Answered   Outpatient Medications Prior to Visit  Medication Sig Dispense Refill   acetaminophen (TYLENOL) 500 MG tablet Take 500 mg by mouth every 6 (six) hours as needed for moderate pain.     acetaminophen (TYLENOL) 650 MG CR tablet      albuterol (VENTOLIN HFA) 108 (90 Base) MCG/ACT inhaler Inhale 2 puffs into the lungs every 6 (six) hours as needed for wheezing or shortness of breath. 8 g 6   ALPRAZolam (XANAX) 0.5 MG tablet Take 1 tablet (0.5 mg total) by mouth as directed. Take 1 tablet (0.5 mg) BID at (8 am & 8 pm) & DAILY PRN FOR ANXIETY 10 tablet 0   ALPRAZolam (XANAX) 0.5 MG tablet 1 tablet     amLODipine (NORVASC) 2.5 MG tablet Take 2.5 mg by mouth daily.     Brinzolamide-Brimonidine (SIMBRINZA) 1-0.2 % SUSP      calcium carbonate (TUMS - DOSED IN MG ELEMENTAL CALCIUM) 500 MG chewable tablet 1 tablet as needed for indigestion or heartburn     carvedilol (COREG) 6.25 MG tablet      clopidogrel (PLAVIX) 75 MG tablet 1 tablet     clopidogrel (PLAVIX) 75 MG tablet      DULoxetine (CYMBALTA) 20 MG capsule duloxetine 20 mg capsule,delayed release  TAKE 1 CAPSULE BY MOUTH THREE TIMES DAILY     DULoxetine (CYMBALTA) 30 MG capsule TAKE 1 CAPSULE     Elastic Bandages & Supports (JOBST ACTIVE 20-30MMHG MEDIUM) MISC See admin instructions.     escitalopram (LEXAPRO) 5 MG tablet Take 1 tablet by mouth daily.     guaiFENesin (MUCINEX) 600 MG 12 hr tablet Take 2 tablets (1,200 mg total) by mouth 2 (two) times daily. 60 tablet 3   Iron-FA-B Cmp-C-Biot-Probiotic (FUSION PLUS) CAPS      latanoprost (XALATAN) 0.005 % ophthalmic solution      levothyroxine (SYNTHROID) 50 MCG tablet 1 tablet in the morning on an empty stomach     mupirocin ointment (BACTROBAN) 2 % Apply to affected digits once daily. (Patient taking differently: Apply 1 application. topically  daily as needed (dry skin).) 30 g 1   nitrofurantoin (MACRODANTIN) 100 MG capsule 1 capsule at bedtime with food or milk     NONFORMULARY OR COMPOUNDED ITEM [facility]:  Neuropathy Cream #11 - Bupivacaine 1%, Doxepin 3%, Gabapentin 6%, Pentoxifylline 3%, Topiramate 1%. Apply 1-2 grams to affected areas 3-4 times a day. (Patient taking differently: [facility]:  Neuropathy Cream #11 - Bupivacaine 1%, Doxepin 3%, Gabapentin 6%, Pentoxifylline 3%, Topiramate 1%. Apply 1-2 grams to affected areas 3-4 times a day PRN for Dry Skin) 100 each 5   pantoprazole (PROTONIX) 40 MG tablet Take 1 tablet by mouth 2 (two) times daily.     polyethylene glycol (MIRALAX / GLYCOLAX) 17 g packet Take 17 g by mouth daily as needed for mild constipation. 14 each 0   ROCKLATAN 0.02-0.005 % SOLN Place 1 drop  into both eyes at bedtime.     senna (SENOKOT) 8.6 MG TABS tablet Take 1 tablet (8.6 mg total) by mouth daily. 120 tablet 0   Simethicone (GAS-X PO)      sodium chloride HYPERTONIC 3 % nebulizer solution Take by nebulization 2 (two) times daily. 75 mL 5   traMADol (ULTRAM) 50 MG tablet Take 1 tablet (50 mg total) by mouth every 6 (six) hours as needed for moderate pain. 30 tablet 0   albuterol (PROVENTIL) (2.5 MG/3ML) 0.083% nebulizer solution Take 3 mLs (2.5 mg total) by nebulization every 6 (six) hours as needed for wheezing or shortness of breath. 75 mL 3   azithromycin (ZITHROMAX) 250 MG tablet TAKE 2 TABLETS BY MOUTH TODAY, THEN TAKE 1 TABLET DAILY FOR 4 DAYS. ALTERNATE EVERY OTHER MONTH WITH CEFDINIR 6 tablet 1   predniSONE (DELTASONE) 10 MG tablet TAKE 1 TABLET (10 MG TOTAL) BY MOUTH DAILY WITH BREAKFAST. 30 tablet 2   amLODipine (NORVASC) 5 MG tablet Take 2.5 mg by mouth every evening.     No facility-administered medications prior to visit.     Review of Systems:   Constitutional: No weight loss or gain, night sweats, fevers, chills, fatigue, or lassitude. HEENT: No headaches, difficulty  swallowing, tooth/dental problems, or sore throat. No sneezing, itching, ear ache, nasal congestion, or post nasal drip CV:  No chest pain, orthopnea, PND, swelling in lower extremities, anasarca, dizziness, palpitations, syncope Resp: +shortness of breath with coughing spells; productive cough. No hemoptysis. No wheezing.  No chest wall deformity GI:  No heartburn, indigestion, abdominal pain, nausea, vomiting, diarrhea, change in bowel habits, loss of appetite, bloody stools.  Skin: No rash, lesions, ulcerations MSK:  No joint pain or swelling.  No decreased range of motion.  No back pain. Neuro: No dizziness or lightheadedness.  Psych: No depression or anxiety. Mood stable.     Physical Exam:  BP 110/60 (BP Location: Left Arm, Cuff Size: Normal)   Pulse 100   Ht 5\' 4"  (1.626 m)   Wt 97 lb 12.8 oz (44.4 kg)   LMP 01/14/1970   SpO2 90%   BMI 16.79 kg/m   GEN: Pleasant, interactive, chronically-ill appearing; elderly; frail; in no acute distress. HEENT:  Normocephalic and atraumatic. PERRLA. Sclera white. Nasal turbinates pink, moist and patent bilaterally. No rhinorrhea present. Oropharynx pink and moist, without exudate or edema. No lesions, ulcerations, or postnasal drip.  NECK:  Supple w/ fair ROM. No JVD present. Normal carotid impulses w/o bruits. Thyroid symmetrical with no goiter or nodules palpated. No lymphadenopathy.   CV: RRR, no m/r/g, no peripheral edema. Pulses intact, +2 bilaterally. No cyanosis, pallor or clubbing. PULMONARY:  Unlabored, regular breathing. Scattered rhonchi bilaterally A&P. No accessory muscle use. No dullness to percussion. GI: BS present and normoactive. Soft, non-tender to palpation. MSK: No erythema, warmth or tenderness. Cap refil <2 sec all extrem. No deformities or joint swelling noted. Muscle wasting Neuro: A/Ox3. No focal deficits noted.   Skin: Warm, no lesions or rashe Psych: Normal affect and behavior. Judgement and thought content  appropriate.     Lab Results:  CBC    Component Value Date/Time   WBC 8.3 01/04/2021 1450   RBC 4.35 01/04/2021 1450   HGB 14.5 01/04/2021 1450   HCT 44.7 01/04/2021 1450   PLT 223.0 01/04/2021 1450   MCV 102.8 (H) 01/04/2021 1450   MCH 32.2 01/26/2020 0332   MCHC 32.3 01/04/2021 1450   RDW 12.9 01/04/2021 1450   LYMPHSABS  1.1 01/04/2021 1450   MONOABS 0.5 01/04/2021 1450   EOSABS 0.0 01/04/2021 1450   BASOSABS 0.0 01/04/2021 1450    BMET    Component Value Date/Time   NA 135 01/04/2021 1450   NA 139 03/25/2017 1216   K 4.1 01/04/2021 1450   CL 98 01/04/2021 1450   CO2 30 01/04/2021 1450   GLUCOSE 140 (H) 01/04/2021 1450   BUN 17 01/04/2021 1450   BUN 14 03/25/2017 1216   CREATININE 0.91 01/04/2021 1450   CALCIUM 9.0 01/04/2021 1450   GFRNONAA >60 01/26/2020 0332   GFRAA >60 07/09/2019 0559    BNP    Component Value Date/Time   BNP 183.9 (H) 07/02/2019 1807     Imaging:  No results found.        View : No data to display.          No results found for: NITRICOXIDE      Assessment & Plan:   Bronchiectasis with acute exacerbation (HCC) Mild bronchiectatic flare. Pt adamant about not wanting to add any addition medications at this point or make changes to her current regimen. I did advise that she restart Mucinex, at minimum, to help with mucociliary clearance and she agreed. Advised CXR to ensure no superimposed infection. Counseled to notify us if she does not improve or worsens - would do a short burst of prednisone then return to daily. Verbalized understanding. EKG for medication monitoring today with nl QTc  Patient Instructions  -Continue albuterol nebs 3 mL Twice daily. Can do every 6 hours as needed for shortness of breath or wheezing -Continue azithromycin 250 mg daily -Continue protonix 40 mg Twice daily  -Continue prednisone 10 mg daily -Continue flutter valve Twice daily and chest vest therapy at home.  -Restart mucinex  (guaifenesin) 600 mg Twice daily    Follow up with Dr. Delton [name] as scheduled. If symptoms do not improve or worsen, please contact office for sooner follow up or seek emergency care.    Asthma No evidence of bronchospasm on exam. Possible mild asthma flare. See above plan.   I spent 35 minutes of dedicated to the care of this patient on the date of this encounter to include pre-visit review of records, face-to-face time with the patient discussing conditions above, post visit ordering of testing, clinical documentation with the electronic health record, making appropriate referrals as documented, and communicating necessary findings to members of the patients care team.  Noemi Chapel, NP 05/21/2021  Pt aware and understands NP's role.

## 2021-05-21 NOTE — Assessment & Plan Note (Signed)
No evidence of bronchospasm on exam. Possible mild asthma flare. See above plan. ?

## 2021-05-21 NOTE — Patient Instructions (Addendum)
-  Continue albuterol nebs 3 mL Twice daily. Can do every 6 hours as needed for shortness of breath or wheezing ?-Continue azithromycin 250 mg daily ?-Continue protonix 40 mg Twice daily  ?-Continue prednisone 10 mg daily ?-Continue flutter valve Twice daily and chest vest therapy at home. ? ?-Restart mucinex (guaifenesin) 600 mg Twice daily  ?  ?Follow up with Dr. Delton Coombes as scheduled. If symptoms do not improve or worsen, please contact office for sooner follow up or seek emergency care. ?

## 2021-05-28 MED ORDER — PREDNISONE 10 MG PO TABS: ORAL_TABLET | ORAL | 0 refills | Status: AC

## 2021-05-28 MED ORDER — BENZONATATE 200 MG PO CAPS: 200.0000 mg | ORAL_CAPSULE | Freq: Three times a day (TID) | ORAL | 1 refills | Status: DC | PRN

## 2021-05-28 NOTE — Telephone Encounter (Signed)
Please send prednisone taper. 4 tabs for 3 days, then 3 tabs for 3 days, 2 tabs for 3 days, then return to daily 10 mg. Take in AM with food. Continue all other therapies; can increase mucinex to 1200 mg Twice daily for time being if she has not already. Also send tessalon perles 200 mg Three times a day as needed for cough. Follow up if no improvement or worsening develops. Thanks!

## 2021-05-28 NOTE — Telephone Encounter (Signed)
Received the following message from patient:  ? ?"Katy, At [facility] last visit you asked Mom to LYK if her condition worsened & you would send her a steroid burst. The last 3 days she has increased mucus & spends a lot of time & energy coughing. She is using albuterol TID, chest vest QD, & flutter valve occasionally. She does not take mucenix.    ?If you choose to send Prednisone, please send to [facility] on [address] [place] ?Sarah [name]" ? ?Joellen Jersey, can you please advise? Thanks!  ?

## 2021-07-05 ENCOUNTER — Other Ambulatory Visit: Payer: Medicare Other

## 2021-07-05 DIAGNOSIS — I2510 Atherosclerotic heart disease of native coronary artery without angina pectoris: Secondary | ICD-10-CM | POA: Diagnosis not present

## 2021-07-05 DIAGNOSIS — Z515 Encounter for palliative care: Secondary | ICD-10-CM

## 2021-07-05 DIAGNOSIS — E039 Hypothyroidism, unspecified: Secondary | ICD-10-CM | POA: Diagnosis not present

## 2021-07-05 DIAGNOSIS — F411 Generalized anxiety disorder: Secondary | ICD-10-CM | POA: Diagnosis not present

## 2021-07-05 DIAGNOSIS — F321 Major depressive disorder, single episode, moderate: Secondary | ICD-10-CM | POA: Diagnosis not present

## 2021-07-05 DIAGNOSIS — R54 Age-related physical debility: Secondary | ICD-10-CM | POA: Diagnosis not present

## 2021-07-05 DIAGNOSIS — I10 Essential (primary) hypertension: Secondary | ICD-10-CM | POA: Diagnosis not present

## 2021-07-26 ENCOUNTER — Encounter: Payer: Self-pay | Admitting: Emergency Medicine

## 2021-07-26 ENCOUNTER — Ambulatory Visit (INDEPENDENT_AMBULATORY_CARE_PROVIDER_SITE_OTHER): Payer: Medicare Other | Admitting: Emergency Medicine

## 2021-07-26 DIAGNOSIS — J479 Bronchiectasis, uncomplicated: Secondary | ICD-10-CM

## 2021-07-26 NOTE — Patient Instructions (Addendum)
We will increase your albuterol nebulizer treatments to 3 times a day every day on a schedule. Continue your chest vest and your flutter valve on the same schedule Continue prednisone 10 mg once daily Continue azithromycin once daily Continue your Mucinex twice a day Follow-up with APP in 3 months Follow with Dr Delton Coombes in 6 months or sooner if you have any problems

## 2021-07-26 NOTE — Assessment & Plan Note (Signed)
Overall doing well but she does have a slightly increased mucus burden and it would seem that she has less control over the timing of when she clears her secretions.  She has had to turn to extra albuterol a few occasions.  Will increase the frequency of her albuterol to 3 times daily on a schedule, continue the rest of her regimen as she has been doing it.  Follow closely.   We will increase your albuterol nebulizer treatments to 3 times a day every day on a schedule. Continue your chest vest and your flutter valve on the same schedule Continue prednisone 10 mg once daily Continue azithromycin once daily Continue your Mucinex twice a day Follow-up with APP in 3 months Follow with Dr Delton Coombes in 6 months or sooner if you have any problems

## 2021-07-26 NOTE — Progress Notes (Signed)
   Subjective:    Patient ID: Sarah [name], female    DOB: 10-Mar-1921, 88 y.o.   MRN: [id number]  HPI  ROV 07/26/21 --follow-up visit for Sarah [name].  She is 88 and has a history of bronchiectasis, moderate obstructive disease and chronic cough due to this as well and has upper airway irritation.  She has Pseudomonas colonization.  We have been managing her on chronic prednisone 10 mg, scheduled azithromycin.  She uses scheduled albuterol nebs to help clear secretions, has not been able to obtain saline nebs.  She has a flutter valve and chest vest. She is able to clear mucous fairly reliably - does wax and wane. Intermittently purulence. She intermittently gets episodes of dyspnea and mucous production. Responds to albuterol.  She has scheduled caregivers - get her nebs and her chest vest, flutter.     Review of Systems As above      Objective:   Physical Exam  Vitals:   07/26/21 1606  BP: 116/72  Pulse: 91  SpO2: 94%  Weight: 96 lb (43.5 kg)  Height: 5\' 4"  (1.626 m)    GEN: awake, interacting in wheelchair, kyphotic   HEENT:  OP clear  NECK: no stridor  RESP distant, B insp rhonchi, insp squeak B upper zones  CARD:  Regular, no M  Musco: some chronic venous stasis, trace ankle edema.   Neuro: awake, poor hearing, oriented       Assessment & Plan:  Bronchiectasis without complication (HCC) Overall doing well but she does have a slightly increased mucus burden and it would seem that she has less control over the timing of when she clears her secretions.  She has had to turn to extra albuterol a few occasions.  Will increase the frequency of her albuterol to 3 times daily on a schedule, continue the rest of her regimen as she has been doing it.  Follow closely.   We will increase your albuterol nebulizer treatments to 3 times a day every day on a schedule. Continue your chest vest and your flutter valve on the same schedule Continue prednisone 10 mg once  daily Continue azithromycin once daily Continue your Mucinex twice a day Follow-up with APP in 3 months Follow with Dr in 6 months or sooner if you have any problems  Delton Coombes, MD, PhD 07/26/2021, 4:26 PM [facility] [phone] or if no answer [phone]

## 2021-07-26 NOTE — Progress Notes (Signed)
COMMUNITY PALLIATIVE CARE SW NOTE  PATIENT NAME: Ashira Kirsten DOB: Sep 02, 1921 MRN: [id number]  PRIMARY CARE PROVIDER: [name], MD  RESPONSIBLE PARTY:  Acct ID - Guarantor Home Phone Work Phone Relationship Acct Type  [id number] Jonetta Osgood* [phone]  Self P/F     [address], [place], [place] [address]   SOCIAL WORK ENCOUNTER  PC SW completed a visit with patient at [facility] [place]. Patient was lying on the couch. She was awake and alert. Patient denied pain. Patient report that she now has caregivers to assist her daily. She is ambulating with a wheelchair outside of her apartment and utilizes a walker/can while inside the apartment. Patient report that her appetite is fair. She admitted to weight loss, but unknown as to how much. Patient appears thinner throughout. She continues to have some shortness of breath with exertion. Patient engaged in life review. She updated SW on family event/changes. Patient verbalized no concerns. No SW issues presented at time. SW to follow-up with patient 6-8 weeks to extend support.     CODE STATUS: DNR ADVANCED DIRECTIVES: Yes MOST FORM COMPLETE:  Yes HOSPICE EDUCATION PROVIDED: No  Duration of visit and documentation: 60 minutes.  [address] [place], [place]

## 2021-08-01 ENCOUNTER — Encounter: Payer: Self-pay | Admitting: Emergency Medicine

## 2021-08-01 DIAGNOSIS — J471 Bronchiectasis with (acute) exacerbation: Secondary | ICD-10-CM

## 2021-08-01 MED ORDER — ALBUTEROL SULFATE (2.5 MG/3ML) 0.083% IN NEBU: 2.5000 mg | INHALATION_SOLUTION | Freq: Four times a day (QID) | RESPIRATORY_TRACT | 3 refills | Status: DC | PRN

## 2021-08-07 ENCOUNTER — Encounter: Payer: Self-pay | Admitting: Podiatry

## 2021-08-07 ENCOUNTER — Ambulatory Visit (INDEPENDENT_AMBULATORY_CARE_PROVIDER_SITE_OTHER): Payer: Medicare Other | Admitting: Podiatry

## 2021-08-07 DIAGNOSIS — M79675 Pain in left toe(s): Secondary | ICD-10-CM

## 2021-08-07 DIAGNOSIS — M79674 Pain in right toe(s): Secondary | ICD-10-CM

## 2021-08-07 DIAGNOSIS — B351 Tinea unguium: Secondary | ICD-10-CM

## 2021-08-07 DIAGNOSIS — I739 Peripheral vascular disease, unspecified: Secondary | ICD-10-CM | POA: Diagnosis not present

## 2021-08-07 DIAGNOSIS — L84 Corns and callosities: Secondary | ICD-10-CM | POA: Diagnosis not present

## 2021-08-07 DIAGNOSIS — E441 Mild protein-calorie malnutrition: Secondary | ICD-10-CM | POA: Insufficient documentation

## 2021-08-12 NOTE — Progress Notes (Signed)
Subjective:  Patient ID: Sarah [name], female    DOB: 1921-03-12,  MRN: [id number]  Sarah [name] presents to clinic today for for at risk foot care. Patient has h/o PAD and painful thick toenails that are difficult to trim. Pain interferes with ambulation. Aggravating factors include wearing enclosed shoe gear. Pain is relieved with periodic professional debridement.  New problem(s): None.   She is accompanied by her daughter, Sarah [name],  on today's visit.  PCP is Sarah Palmer, MD , and last visit was  June 25, 2021  Allergies  Allergen Reactions   Codeine Anaphylaxis   Hydrocodone Nausea And Vomiting and Other (See Comments)    SYNCOPE AND BRADYCARDIA Other reaction(s): Unknown   Isosorbide Other (See Comments)    BRADYCARDIA   Isosorbide Nitrate Other (See Comments)    BRADYCARDIA   Oxycodone Palpitations    Rapid heart beat Other reaction(s): vomiting   Acetaminophen-Codeine Other (See Comments)    Other reaction(s): angioedema Other reaction(s): angioedema Other reaction(s): angioedema Other reaction(s): angioedema   Amoxicillin-Pot Clavulanate Diarrhea    Other reaction(s): watery incontinent diarrhea   Butalbital-Aspirin-Caffeine Swelling and Other (See Comments)    Facial swelling  Other reaction(s): angioedema   Clarithromycin Nausea And Vomiting    Has patient had a PCN reaction causing immediate rash, facial/tongue/throat swelling, SOB or lightheadedness with hypotension: Unknown Has patient had a PCN reaction causing severe rash involving mucus membranes or skin necrosis: Unknown Has patient had a PCN reaction that required hospitalization: Unknown Has patient had a PCN reaction occurring within the last 10 years: Unknown If all of the above answers are "NO", then may proceed with Cephalosporin use.  Other reaction(s): vomiting   Levofloxacin Nausea And Vomiting and Other (See Comments)    SYNCOPE ALSO Other reaction(s): vomiting   Doxycycline Hyclate      Other reaction(s): Unknown   Hydrocodone-Acetaminophen    Isosorbide Dinitrate    Doxycycline Other (See Comments)    Sweats and aches    Review of Systems: Negative except as noted in the HPI.  Objective:  Constitutional Sarah [name] is a pleasant 88 y.o. Caucasian female, frail, in NAD. AAO x 3.   Vascular Capillary fill time to digits <3 seconds b/l lower extremities. Faintly palpable DP pulse(s) b/l lower extremities. Nonpalpable PT pulse(s) b/l lower extremities. Pedal hair absent. Lower extremity skin temperature gradient warm to cool. No pain with calf compression b/l. Trace edema noted b/l lower extremities. Venous stasis noted b/l LE. No cyanosis or clubbing noted.  Neurologic Normal speech. Oriented to person, place, and time. Pt has subjective symptoms of neuropathy. Protective sensation intact 5/5 intact bilaterally with 10g monofilament b/l.  Dermatologic Pedal skin is thin shiny, atrophic b/l lower extremities. No open wounds b/l lower extremities. No interdigital macerations b/l lower extremities. Toenails 1-5 b/l elongated, discolored, dystrophic, thickened, crumbly with subungual debris and tenderness to dorsal palpation. Hyperkeratotic lesion(s) bilateral 2nd toes at PIPJ.  No erythema, no edema, no drainage, no fluctuance.   Orthopedic: Normal muscle strength 5/5 to all lower extremity muscle groups bilaterally. Severe hammertoe deformity noted 2-5 bilaterally.   Radiographs: None  Assessment/Plan: 1. Pain due to onychomycosis of toenails of both feet   2. Corns   3. PAD (peripheral artery disease) (HCC)      -Patient was evaluated and treated. All patient's and/or POA's questions/concerns answered on today's visit. -Recommended change in shoe gear to houseslippers with stretchable uppers or diabetic houseslippers. -Mycotic toenails 1-5 bilaterally were debrided in length and girth  with sterile nail nippers and dremel without incident. -Corn(s) bilateral 2nd toes  pared utilizing sterile scalpel blade without complication or incident. Total number debrided=2. -Patient/POA to call should there be question/concern in the interim.   Return in about 3 months (around 11/07/2021).  Freddie Breech, DPM

## 2021-08-16 DIAGNOSIS — H10023 Other mucopurulent conjunctivitis, bilateral: Secondary | ICD-10-CM | POA: Diagnosis not present

## 2021-08-31 DIAGNOSIS — H10023 Other mucopurulent conjunctivitis, bilateral: Secondary | ICD-10-CM | POA: Diagnosis not present

## 2021-08-31 DIAGNOSIS — H1132 Conjunctival hemorrhage, left eye: Secondary | ICD-10-CM | POA: Diagnosis not present

## 2021-09-01 ENCOUNTER — Other Ambulatory Visit: Payer: Self-pay | Admitting: Nurse Practitioner

## 2021-09-01 DIAGNOSIS — J471 Bronchiectasis with (acute) exacerbation: Secondary | ICD-10-CM

## 2021-09-10 ENCOUNTER — Telehealth: Payer: Self-pay | Admitting: Emergency Medicine

## 2021-09-10 NOTE — Telephone Encounter (Signed)
Called and spoke with pt's daughter and have scheduled pt an appt with RB tomorrow 8/29 at 11:15. Nothing further needed.

## 2021-09-11 ENCOUNTER — Encounter: Payer: Self-pay | Admitting: Emergency Medicine

## 2021-09-11 ENCOUNTER — Ambulatory Visit (INDEPENDENT_AMBULATORY_CARE_PROVIDER_SITE_OTHER): Payer: Medicare Other | Admitting: Emergency Medicine

## 2021-09-11 ENCOUNTER — Telehealth: Payer: Self-pay | Admitting: Emergency Medicine

## 2021-09-11 VITALS — BP 116/68 | HR 106 | Temp 97.4°F | Ht 62.0 in | Wt 92.0 lb

## 2021-09-11 DIAGNOSIS — J471 Bronchiectasis with (acute) exacerbation: Secondary | ICD-10-CM

## 2021-09-11 MED ORDER — PREDNISONE 10 MG PO TABS: ORAL_TABLET | ORAL | 0 refills | Status: AC

## 2021-09-11 MED ORDER — LEVOFLOXACIN 500 MG PO TABS: 500.0000 mg | ORAL_TABLET | Freq: Every day | ORAL | 0 refills | Status: DC

## 2021-09-11 NOTE — Progress Notes (Signed)
Subjective:    Patient ID: Sarah [name], female    DOB: 1921/11/24, 88 y.o.   MRN: [id number]  HPI  ROV 07/26/21 --follow-up visit for Sarah [name].  She is 88 and has a history of bronchiectasis, moderate obstructive disease and chronic cough due to this as well and has upper airway irritation.  She has Pseudomonas colonization.  We have been managing her on chronic prednisone 10 mg, scheduled azithromycin.  She uses scheduled albuterol nebs to help clear secretions, has not been able to obtain saline nebs.  She has a flutter valve and chest vest. She is able to clear mucous fairly reliably - does wax and wane. Intermittently purulence. She intermittently gets episodes of dyspnea and mucous production. Responds to albuterol.  She has scheduled caregivers - get her nebs and her chest vest, flutter.   Acute OV 09/11/2021 --Sarah [name] is 88 and has a history of bronchiectasis, moderate obstructive lung disease and chronic cough with chronic upper airway irritation.  She is colonized with Pseudomonas.  Has been on chronic prednisone, scheduled azithromycin, chest vest and mucus management.  Today she reports that she has been having URI sx, has tested COVID negative x 2 last on Friday. She has had significant increase in mucous production, now yellow to brown. Very fatigued. SpO2 was noted to be in high 80's over the weekend.    Review of Systems As above      Objective:   Physical Exam  Vitals:   09/11/21 1105  BP: 116/68  Pulse: (!) 106  Temp: (!) 97.4 F (36.3 C)  TempSrc: Oral  SpO2: 94%  Weight: 92 lb (41.7 kg)  Height: 5\' 2"  (1.575 m)    GEN: awake, interacting in wheelchair, kyphotic   HEENT:  OP clear  NECK: no stridor  RESP distant, B insp rhonchi increased from prior, insp squeak B upper zones  CARD:  Regular, no M  Musco: some chronic venous stasis, trace ankle edema.   Neuro: awake, poor hearing, oriented.  Globally weak       Assessment & Plan:  Bronchiectasis  with acute exacerbation (HCC) She has a flare of her bronchiectasis.  She is frail and has associated hypoxemia.  We will obtain an oxygen concentrator for her.  Try to treat her as an outpatient although this may be difficult as she tolerates p.o. antibiotics poorly.  We can try levofloxacin although has caused nausea in the past.  If she cannot tolerate or if she is not improving as an outpatient will need to make a decision with her daughter regarding admission to the hospital versus transition to comfort.  I suspect they will want to at least try going to the hospital see if we can get her over this flare.  We will order an oxygen concentrator for you.  You need to be on 2 L/min at all times for now.  We can reassess after this acute illness. Please take levofloxacin 500 mg once daily for 10 days. Please increase your prednisone to 30 mg daily for 4 days, then go to 20 mg daily for 4 days, then go back to your usual 10 mg daily. Use your albuterol nebulizer 3 times a day on a schedule to help clear mucus and secretions. Continue your chest vest and your flutter valve as you have been using them. Please call this week to update on your symptoms.  If we are not improving then we may need to admit you to the hospital  to be treated with IV medications. Follow-up with APP in 1 week Follow with Dr Delton Coombes in 1 month  Levy Pupa, MD, PhD 09/11/2021, 12:38 PM [facility] [phone] or if no answer [phone]

## 2021-09-11 NOTE — Telephone Encounter (Signed)
Spoke with the pt's daughter and made aware of recs per RB  She verbalized understanding  Nothing further needed

## 2021-09-11 NOTE — Telephone Encounter (Signed)
Patient was seen by Dr. Delton Coombes today and prescribed Levaquin for 10 days.  Patient is currently taking azithromycin daily and Sharyl Nimrod (patient daughter) is wanting to see if she needs to hold azithromycin until patient has completed Levaquin?   Message routed to Dr. Delton Coombes to advise

## 2021-09-11 NOTE — Patient Instructions (Addendum)
We will order an oxygen concentrator for you.  You need to be on 2 L/min at all times for now.  We can reassess after this acute illness. Please take levofloxacin 500 mg once daily for 10 days. Please increase your prednisone to 30 mg daily for 4 days, then go to 20 mg daily for 4 days, then go back to your usual 10 mg daily. Use your albuterol nebulizer 3 times a day on a schedule to help clear mucus and secretions. Continue your chest vest and your flutter valve as you have been using them. Please call this week to update on your symptoms.  If we are not improving then we may need to admit you to the hospital to be treated with IV medications. Follow-up with APP in 1 week Follow with Dr Delton Coombes in 1 month

## 2021-09-11 NOTE — Telephone Encounter (Signed)
Hold the azithro while she is on the levaquin

## 2021-09-11 NOTE — Assessment & Plan Note (Signed)
She has a flare of her bronchiectasis.  She is frail and has associated hypoxemia.  We will obtain an oxygen concentrator for her.  Try to treat her as an outpatient although this may be difficult as she tolerates p.o. antibiotics poorly.  We can try levofloxacin although has caused nausea in the past.  If she cannot tolerate or if she is not improving as an outpatient will need to make a decision with her daughter Sharyl Nimrod regarding admission to the hospital versus transition to comfort.  I suspect they will want to at least try going to the hospital see if we can get her over this flare.  We will order an oxygen concentrator for you.  You need to be on 2 L/min at all times for now.  We can reassess after this acute illness. Please take levofloxacin 500 mg once daily for 10 days. Please increase your prednisone to 30 mg daily for 4 days, then go to 20 mg daily for 4 days, then go back to your usual 10 mg daily. Use your albuterol nebulizer 3 times a day on a schedule to help clear mucus and secretions. Continue your chest vest and your flutter valve as you have been using them. Please call this week to update on your symptoms.  If we are not improving then we may need to admit you to the hospital to be treated with IV medications. Follow-up with APP in 1 week Follow with Dr Delton Coombes in 1 month

## 2021-09-20 ENCOUNTER — Ambulatory Visit (INDEPENDENT_AMBULATORY_CARE_PROVIDER_SITE_OTHER): Payer: Medicare Other | Admitting: Nurse Practitioner

## 2021-09-20 ENCOUNTER — Encounter: Payer: Self-pay | Admitting: Nurse Practitioner

## 2021-09-20 DIAGNOSIS — J9601 Acute respiratory failure with hypoxia: Secondary | ICD-10-CM

## 2021-09-20 DIAGNOSIS — J471 Bronchiectasis with (acute) exacerbation: Secondary | ICD-10-CM | POA: Diagnosis not present

## 2021-09-20 NOTE — Patient Instructions (Addendum)
Continue prednisone 10 mg daily Restart azithromycin 250 mg daily  Continue your albuterol nebulizer 3 times a day on a schedule to help clear mucus and secretions. Continue your chest vest and your flutter valve as you have been using them. Continue protonix 40 mg daily  Continue supplemental oxygen 2 lpm for goal oxygen saturation >88-90% with activity and at night.   Follow up as scheduled with Dr. Delton Coombes on 10/12. If symptoms do not improve or worsen, please contact office for sooner follow up or seek emergency care.

## 2021-09-20 NOTE — Progress Notes (Signed)
_0  ID: Sarah [name], female    DOB: October 05, 1921, 88 y.o.   MRN: [id number]  Chief Complaint  Patient presents with   Follow-up    Referring provider: [name], MD  HPI: 88 year old female, never smoker followed for asthma, bronchiectasis with pseudomonal colonization, allergic rhinitis, chronic cough.  She is a patient of Dr. Agustina Caroli and was last seen in office on 09/11/2021.  Past medical history significant for hypertension, NSTEMI, stroke, CAD, chronic diastolic CHF, hypothyroidism, arthritis, adrenal insufficiency, CKD stage III, GERD, depression/anxiety, HLD, IDA, macular degeneration, prediabetes.  TEST/EVENTS:  02/24/2012 CT chest without contrast: Mediastinal lymph nodes measure up to 12 mm in the precarinal station, as before.  Moderate hiatal hernia.  Trace amount of loculated pleural fluid in posterior medial inferior left hemithorax.  Mild biapical pleural-parenchymal scarring.  Interval progression of bibasilar predominant peribronchovascular nodularity, areas of airspace consolidation and scattered bronchiectasis.  Findings suggestive of MAC. 07/02/2019 CTA chest: Scattered low-attenuation mediastinal and hilar lymph nodes, many subcentimeter.  Largest node is 13 mm precarinal node, stable.  Airways diffusely thickened and many towards the bases appear fluid-filled with some chronic architectural distortion and scarring in LLL, lingula and RML.  Increasing consolidative opacities present in right lung base.  Additional patchy consolidation with tree-in-bud nodularity is present in the right upper lobe as well.  Some mosaic attenuation may reflect further small airways disease or air trapping.  No PE.  Large hiatal hernia. 11/15/2018 CXR 1 view: Unchanged chronic increased opacity at the left lung base, likely atelectasis.  No new acute abnormality. 01/04/2021 CXR 2 View: large hiatal hernia. Lungs clear without acute process.  05/21/2021 CXR: stable chronic lung disease with  diffuse interstitial prominence, btx, and scattered parenchymal opacities. No new airspace disease.   07/26/2021: OV with [name]. BTX maintained on chronic prednisone and azithromycin. Uses nebs, vest therapy, and flutter for mucociliary clearance. Overall doing well; does have a slight increase in mucus burden. Seems to have less control over timing of when she clears secretions. Increase albuterol to tid, on a schedule.   09/11/2021: OV with [name]. Hx of BTX, obstructive lung disease and chronic cough. Colonized with pseudomonas. Has been on chronic prednisone, scheduled azithromycin, chest vest, and mucus management. She has been having URI symptoms; COVID negative x 2 last Friday. Significant increase in mucus production, now yellow to brown. Very fatigued. SpO2 noted to be in the high 80's over the weekend. Treated for flare of her bronchiectasis; try to treat outpatient but has tolerated oral abx poorly in past. Levaquin 500 mg x 10days. Prednisone taper. Advised to notify if no improvement; discussed admission to the hospital vs transition to comfort. Close follow up  09/20/2021: Today - follow up Patient presents today with her daughter for follow up. She is feeling significantly better than she was when she was here on 8/29. She has regained some of her energy and feels like her breathing has improved. Her cough has cleared up. She's still producing a moderate amount of sputum but it is now clear to white. Her albuterol nebs have helped a lot. Her appetite has improved. She was able to tolerate the levaquin, which she finished this morning. She denies any fevers, hemoptysis, chills. She is using her vest therapy 1-2 times a day and flutter valve twice a day. She does not take mucinex because she can't swallow the pills and they have had a hard time finding liquid. She is back on her daily 10 mg prednisone and  will restart her azithromycin tomorrow. She is wearing supplemental oxygen 2 lpm, except  for when she showers. Oxygen levels have been in the 90's while on O2. She is wanting to go back out to the dining room for meals, which they may try tomorrow. She has 24 hour care at her assisted living facility and great support from her family.  Allergies  Allergen Reactions   Codeine Anaphylaxis   Hydrocodone Nausea And Vomiting and Other (See Comments)    SYNCOPE AND BRADYCARDIA Other reaction(s): Unknown   Isosorbide Other (See Comments)    BRADYCARDIA   Isosorbide Nitrate Other (See Comments)    BRADYCARDIA   Oxycodone Palpitations    Rapid heart beat Other reaction(s): vomiting   Acetaminophen-Codeine Other (See Comments)    Other reaction(s): angioedema Other reaction(s): angioedema Other reaction(s): angioedema Other reaction(s): angioedema   Amoxicillin-Pot Clavulanate Diarrhea    Other reaction(s): watery incontinent diarrhea   Butalbital-Aspirin-Caffeine Swelling and Other (See Comments)    Facial swelling  Other reaction(s): angioedema   Clarithromycin Nausea And Vomiting    Has patient had a PCN reaction causing immediate rash, facial/tongue/throat swelling, SOB or lightheadedness with hypotension: Unknown Has patient had a PCN reaction causing severe rash involving mucus membranes or skin necrosis: Unknown Has patient had a PCN reaction that required hospitalization: Unknown Has patient had a PCN reaction occurring within the last 10 years: Unknown If all of the above answers are "NO", then may proceed with Cephalosporin use.  Other reaction(s): vomiting   Levofloxacin Nausea And Vomiting and Other (See Comments)    SYNCOPE ALSO Other reaction(s): vomiting   Doxycycline Hyclate     Other reaction(s): Unknown   Hydrocodone-Acetaminophen    Isosorbide Dinitrate    Doxycycline Other (See Comments)    Sweats and aches    Immunization History  Administered Date(s) Administered   Fluad Quad(high Dose 65+) 10/08/2018, 10/11/2019, 10/13/2020   Influenza Split  10/15/2011, 10/14/2012, 09/15/2013, 10/29/2016, 10/08/2018, 10/11/2019   Influenza Whole 10/17/2008, 11/14/2009, 11/13/2010   Influenza, High Dose Seasonal PF 11/21/2015, 10/29/2016, 09/22/2017   Influenza,inj,Quad PF,6+ Mos 09/17/2013, 10/07/2014   Influenza,inj,quad, With Preservative 11/08/2014   Influenza-Unspecified 10/13/2020   Moderna Covid-19 Vaccine Bivalent Booster 5yr & up 09/21/2020   Moderna Sars-Covid-2 Vaccination 02/04/2019, 03/04/2019, 10/15/2019, 06/07/2020   PPD Test 03/28/2014   Pneumococcal Conjugate-13 04/07/2014   Pneumococcal Polysaccharide-23 02/19/1999   Tdap 09/21/2012    Past Medical History:  Diagnosis Date   Allergic rhinitis    Anxiety    Anxiety attacks   Arthritis    "a little; in my back and hands" (04/15/2012)   Asthma    Bronchiectasis    with history of Legionaires with chronic rales/rhonchi   Chest pain at rest    Daily headache    "over the last week" 04/15/2012    Depression    Diastolic dysfunction    a. per echo 08/2010 with normal LV function;  b. 06/2011 Echo: EF 65-70%   GERD (gastroesophageal reflux disease)    "just recently" (04/15/2012)   H/O hiatal hernia    H/O Legionnaire's disease 11/1976   History of blood transfusion 1972   "w/hysterectomy" (04/15/2012)   Hypercholesteremia    Hypertension    Negative renal duplex in December of 2012   Hypothyroidism    Idiopathic peripheral neuropathy    MAC (mycobacterium avium-intracellulare complex)    Migraines    "outgrew them" (04/15/2012)   NSTEMI (non-ST elevated myocardial infarction) ([facility]    a. Normal coronaries  per cath August 2012 and negative CT angio for PE;  b. 06/2011 Repeat admission w/ chest pain and elevated troponin's, CTA Chest w/o PE   Osteopenia 02/2011   t score - 2.1   Pneumonia    "recurrent" (04/15/2012)   Pollen allergies    Shortness of breath    "sometimes just lying down" (04/15/2012)   Stroke ([place]) 06/2014   cerebellum    Tobacco History: Social History    Tobacco Use  Smoking Status Never  Smokeless Tobacco Never   Counseling given: Not Answered   Outpatient Medications Prior to Visit  Medication Sig Dispense Refill   albuterol (PROVENTIL) (2.5 MG/3ML) 0.083% nebulizer solution INHALE 3 ML BY NEBULIZATION EVERY 6 HOURS AS NEEDED FOR WHEEZING OR SHORTNESS OF BREATH 225 mL 3   albuterol (VENTOLIN HFA) 108 (90 Base) MCG/ACT inhaler Inhale 2 puffs into the lungs every 6 (six) hours as needed for wheezing or shortness of breath. 8 g 6   predniSONE (DELTASONE) 10 MG tablet Take 1 tablet (10 mg total) by mouth daily with breakfast. 90 tablet 1   acetaminophen (TYLENOL) 650 MG CR tablet      ALPRAZolam (XANAX) 0.5 MG tablet Take 1 tablet (0.5 mg total) by mouth as directed. Take 1 tablet (0.5 mg) BID at (8 am & 8 pm) & DAILY PRN FOR ANXIETY 10 tablet 0   amLODipine (NORVASC) 2.5 MG tablet Take 2.5 mg by mouth daily.     azithromycin (ZITHROMAX) 250 MG tablet Take 1 tablet (250 mg total) by mouth daily. 90 tablet 1   benzonatate (TESSALON) 200 MG capsule Take 1 capsule (200 mg total) by mouth 3 (three) times daily as needed for cough. (Patient not taking: Reported on 09/11/2021) 30 capsule 1   Brinzolamide-Brimonidine (SIMBRINZA) 1-0.2 % SUSP      calcium carbonate (TUMS - DOSED IN MG ELEMENTAL CALCIUM) 500 MG chewable tablet 1 tablet as needed for indigestion or heartburn     carvedilol (COREG) 6.25 MG tablet      clopidogrel (PLAVIX) 75 MG tablet      DULoxetine (CYMBALTA) 20 MG capsule duloxetine 20 mg capsule,delayed release  TAKE 1 CAPSULE BY MOUTH THREE TIMES DAILY     DULoxetine (CYMBALTA) 30 MG capsule TAKE 1 CAPSULE     Elastic Bandages & Supports (JOBST ACTIVE 20-30MMHG MEDIUM) MISC See admin instructions.     escitalopram (LEXAPRO) 5 MG tablet Take 1 tablet by mouth daily.     guaiFENesin (MUCINEX) 600 MG 12 hr tablet Take 2 tablets (1,200 mg total) by mouth 2 (two) times daily. (Patient not taking: Reported on 09/20/2021) 60 tablet 3    Iron-FA-B Cmp-C-Biot-Probiotic (FUSION PLUS) CAPS      latanoprost (XALATAN) 0.005 % ophthalmic solution      levofloxacin (LEVAQUIN) 500 MG tablet Take 1 tablet (500 mg total) by mouth daily. 10 tablet 0   levothyroxine (SYNTHROID) 50 MCG tablet 1 tablet in the morning on an empty stomach     mupirocin ointment (BACTROBAN) 2 % Apply to affected digits once daily. (Patient taking differently: Apply 1 application  topically daily as needed (dry skin).) 30 g 1   nitrofurantoin (MACRODANTIN) 100 MG capsule 1 capsule at bedtime with food or milk     NONFORMULARY OR COMPOUNDED ITEM [facility]:  Neuropathy Cream #11 - Bupivacaine 1%, Doxepin 3%, Gabapentin 6%, Pentoxifylline 3%, Topiramate 1%. Apply 1-2 grams to affected areas 3-4 times a day. (Patient taking differently: [facility]:  Neuropathy Cream #11 -  Bupivacaine 1%, Doxepin 3%, Gabapentin 6%, Pentoxifylline 3%, Topiramate 1%. Apply 1-2 grams to affected areas 3-4 times a day PRN for Dry Skin) 100 each 5   pantoprazole (PROTONIX) 40 MG tablet Take 1 tablet by mouth 2 (two) times daily.     polyethylene glycol (MIRALAX / GLYCOLAX) 17 g packet Take 17 g by mouth daily as needed for mild constipation. 14 each 0   ROCKLATAN 0.02-0.005 % SOLN Place 1 drop into both eyes at bedtime.     senna (SENOKOT) 8.6 MG TABS tablet Take 1 tablet (8.6 mg total) by mouth daily. 120 tablet 0   Simethicone (GAS-X PO)      sodium chloride HYPERTONIC 3 % nebulizer solution Take by nebulization 2 (two) times daily. (Patient not taking: Reported on 09/20/2021) 75 mL 5   traMADol (ULTRAM) 50 MG tablet Take 1 tablet (50 mg total) by mouth every 6 (six) hours as needed for moderate pain. 30 tablet 0   No facility-administered medications prior to visit.     Review of Systems:   Constitutional: No weight loss or gain, night sweats, fevers, chills +fatigue, lassitude (significantly improved) HEENT: No headaches, difficulty swallowing, tooth/dental problems,  or sore throat. No sneezing, itching, ear ache, nasal congestion, or post nasal drip CV:  No chest pain, orthopnea, PND, swelling in lower extremities, anasarca, dizziness, palpitations, syncope Resp: +shortness of breath with exertion (improved); productive cough (improved); chest congestion (improved). No hemoptysis. No wheezing.  No chest wall deformity GI:  No heartburn, indigestion, abdominal pain, nausea, vomiting, diarrhea, change in bowel habits, loss of appetite, bloody stools.  Neuro: No dizziness or lightheadedness.  Psych: No depression or anxiety. Mood stable.     Physical Exam:  BP 110/68 (BP Location: Left Arm)   Pulse 94   Ht _0  (1.575 m)   Wt 96 lb (43.5 kg)   LMP 01/14/1970   SpO2 94% Comment: On oxygen  BMI 17.56 kg/m   GEN: Pleasant, interactive, chronically-ill appearing; elderly; frail; in no acute distress. HEENT:  Normocephalic and atraumatic. PERRLA. Sclera white. Nasal turbinates pink, moist and patent bilaterally. No rhinorrhea present. Oropharynx pink and moist, without exudate or edema. No lesions, ulcerations, or postnasal drip.  NECK:  Supple w/ fair ROM. No JVD present. Normal carotid impulses w/o bruits. Thyroid symmetrical with no goiter or nodules palpated. No lymphadenopathy.   CV: RRR, no m/r/g, no peripheral edema. Pulses intact, +2 bilaterally. No cyanosis, pallor or clubbing. PULMONARY:  Unlabored, regular breathing. Minimal rhonchi right lung otherwise clear bilaterally A&P. No accessory muscle use. No dullness to percussion. GI: BS present and normoactive. Soft, non-tender to palpation. MSK: No erythema, warmth or tenderness. Cap refil <2 sec all extrem. No deformities or joint swelling noted. Muscle wasting Neuro: A/Ox3. No focal deficits noted.   Skin: Warm, no lesions or rashe Psych: Normal affect and behavior. Judgement and thought content appropriate.     Lab Results:  CBC    Component Value Date/Time   WBC 8.3 01/04/2021 1450    RBC 4.35 01/04/2021 1450   HGB 14.5 01/04/2021 1450   HCT 44.7 01/04/2021 1450   PLT 223.0 01/04/2021 1450   MCV 102.8 (H) 01/04/2021 1450   MCH 32.2 01/26/2020 0332   MCHC 32.3 01/04/2021 1450   RDW 12.9 01/04/2021 1450   LYMPHSABS 1.1 01/04/2021 1450   MONOABS 0.5 01/04/2021 1450   EOSABS 0.0 01/04/2021 1450   BASOSABS 0.0 01/04/2021 1450    BMET    Component Value Date/Time  NA 135 01/04/2021 1450   NA 139 03/25/2017 1216   K 4.1 01/04/2021 1450   CL 98 01/04/2021 1450   CO2 30 01/04/2021 1450   GLUCOSE 140 (H) 01/04/2021 1450   BUN 17 01/04/2021 1450   BUN 14 03/25/2017 1216   CREATININE 0.91 01/04/2021 1450   CALCIUM 9.0 01/04/2021 1450   GFRNONAA >60 01/26/2020 0332   GFRAA >60 07/09/2019 0559    BNP    Component Value Date/Time   BNP 183.9 (H) 07/02/2019 1807     Imaging:  No results found.        No data to display          No results found for: "NITRICOXIDE"      Assessment & Plan:   Bronchiectasis with acute exacerbation ([place]) She was seen 8/29 and treated for bronchiectatic flare with levaquin course and prednisone taper, which she has completed. She is clinically improving. Her daughter reports that she looks much better and closer to her baseline. She still has a productive cough which is less copious and no longer purulent. Encouraged her to continue mucociliary clearance therapies. Cough is strong. She will also continue albuterol nebs at least three times a day. Activity encouraged.   Patient Instructions  Continue prednisone 10 mg daily Restart azithromycin 250 mg daily  Continue your albuterol nebulizer 3 times a day on a schedule to help clear mucus and secretions. Continue your chest vest and your flutter valve as you have been using them. Continue protonix 40 mg daily  Continue supplemental oxygen 2 lpm for goal oxygen saturation >88-90% with activity and at night.   Follow up as scheduled with [name] on 10/12. If symptoms  do not improve or worsen, please contact office for sooner follow up or seek emergency care.     Acute respiratory failure with hypoxia (HCC) Improving. She was able to maintain saturations >90% on room air today while at rest. Advised her to continue wearing 2 lpm with activity and at nighttime for now. Goal >88-90%. We can re-evaluate at her follow up to determine if she needs ongoing therapy.     I spent 28 minutes of dedicated to the care of this patient on the date of this encounter to include pre-visit review of records, face-to-face time with the patient discussing conditions above, post visit ordering of testing, clinical documentation with the electronic health record, making appropriate referrals as documented, and communicating necessary findings to members of the patients care team.  Clayton Bibles, NP 09/20/2021  Pt aware and understands NP's role.

## 2021-09-20 NOTE — Assessment & Plan Note (Signed)
Improving. She was able to maintain saturations >90% on room air today while at rest. Advised her to continue wearing 2 lpm with activity and at nighttime for now. Goal >88-90%. We can re-evaluate at her follow up to determine if she needs ongoing therapy.

## 2021-09-20 NOTE — Assessment & Plan Note (Signed)
She was seen 8/29 and treated for bronchiectatic flare with levaquin course and prednisone taper, which she has completed. She is clinically improving. Her daughter reports that she looks much better and closer to her baseline. She still has a productive cough which is less copious and no longer purulent. Encouraged her to continue mucociliary clearance therapies. Cough is strong. She will also continue albuterol nebs at least three times a day. Activity encouraged.   Patient Instructions  Continue prednisone 10 mg daily Restart azithromycin 250 mg daily  Continue your albuterol nebulizer 3 times a day on a schedule to help clear mucus and secretions. Continue your chest vest and your flutter valve as you have been using them. Continue protonix 40 mg daily  Continue supplemental oxygen 2 lpm for goal oxygen saturation >88-90% with activity and at night.   Follow up as scheduled with Dr. Delton Coombes on 10/12. If symptoms do not improve or worsen, please contact office for sooner follow up or seek emergency care.

## 2021-10-01 ENCOUNTER — Encounter: Payer: Self-pay | Admitting: *Deleted

## 2021-10-01 ENCOUNTER — Telehealth: Payer: Self-pay | Admitting: *Deleted

## 2021-10-01 NOTE — Patient Instructions (Signed)
Visit Information  Thank you for taking time to visit with me today. Please don't hesitate to contact me if I can be of assistance to you.   Following are the goals we discussed today:   Goals Addressed               This Visit's Progress     No needs (pt-stated)        Care Coordination Interventions: Reviewed medications with patient and discussed adherence and educated accordingly Reviewed scheduled/upcoming provider appointments including pending appointments and requested caregiver to schedule pt's AWV with her primary provider for 2023 (receptive). Assessed social determinant of health barriers Contacted [facility] to verify Katheren Puller, LSW is with this organization. Informed the contact number for this social worker is [phone]. Will provider to the caregiver daughter [name] to call and inquire with any concerns or questions. This LSW has indicated in her notes that she will follow up with this pt in 6-8 weeks from the last visit on 07/05/2021.          Please call the care guide team at [phone] if you need to cancel or reschedule your appointment.   If you are experiencing a Mental Health or [place] or need someone to talk to, please call the Suicide and Crisis Lifeline: 988  Patient verbalizes understanding of instructions and care plan provided today and agrees to view in [place]. Active MyChart status and patient understanding of how to access instructions and care plan via MyChart confirmed with patient.     No further follow up required: No needs  Raina Mina, RN Care Management Coordinator [place] Office [phone]

## 2021-10-01 NOTE — Patient Outreach (Signed)
  Care Coordination   Initial Visit Note   10/01/2021 Name: Sarah [name] MRN: [id number] DOB: 05/10/1921  Sarah [name] is a 88 y.o. year old female who sees [name], MD for primary care. I spoke with  [name] by phone today.  What matters to the patients health and wellness today?  No needs    Goals Addressed               This Visit's Progress     No needs (pt-stated)        Care Coordination Interventions: Reviewed medications with patient and discussed adherence and educated accordingly Reviewed scheduled/upcoming provider appointments including pending appointments and requested caregiver to schedule pt's AWV with her primary provider for 2023 (receptive). Assessed social determinant of health barriers Contacted [facility] to verify Katheren Puller, LSW is with this organization. Informed the contact number for this social worker is [phone]. Will provider to the caregiver daughter [name] to call and inquire with any concerns or questions. This LSW has indicated in her notes that she will follow up with this pt in 6-8 weeks from the last visit on 07/05/2021.          SDOH assessments and interventions completed:  Yes  SDOH Interventions Today    Flowsheet Row Most Recent Value  SDOH Interventions   Food Insecurity Interventions Intervention Not Indicated  Housing Interventions Intervention Not Indicated  Transportation Interventions Intervention Not Indicated        Care Coordination Interventions Activated:  Yes  Care Coordination Interventions:  Yes, provided   Follow up plan: No further intervention required.   Encounter Outcome:  Pt. Visit Completed   Raina Mina, RN Care Management Coordinator [place] Office [phone]

## 2021-10-05 DIAGNOSIS — I2510 Atherosclerotic heart disease of native coronary artery without angina pectoris: Secondary | ICD-10-CM | POA: Diagnosis not present

## 2021-10-05 DIAGNOSIS — I10 Essential (primary) hypertension: Secondary | ICD-10-CM | POA: Diagnosis not present

## 2021-10-05 DIAGNOSIS — E039 Hypothyroidism, unspecified: Secondary | ICD-10-CM | POA: Diagnosis not present

## 2021-10-05 DIAGNOSIS — R54 Age-related physical debility: Secondary | ICD-10-CM | POA: Diagnosis not present

## 2021-10-05 DIAGNOSIS — I639 Cerebral infarction, unspecified: Secondary | ICD-10-CM | POA: Diagnosis not present

## 2021-10-05 DIAGNOSIS — F321 Major depressive disorder, single episode, moderate: Secondary | ICD-10-CM | POA: Diagnosis not present

## 2021-10-10 DIAGNOSIS — Z23 Encounter for immunization: Secondary | ICD-10-CM | POA: Diagnosis not present

## 2021-10-23 DIAGNOSIS — H6122 Impacted cerumen, left ear: Secondary | ICD-10-CM | POA: Diagnosis not present

## 2021-10-23 DIAGNOSIS — Z974 Presence of external hearing-aid: Secondary | ICD-10-CM | POA: Diagnosis not present

## 2021-10-23 DIAGNOSIS — H93293 Other abnormal auditory perceptions, bilateral: Secondary | ICD-10-CM | POA: Diagnosis not present

## 2021-10-24 DIAGNOSIS — E43 Unspecified severe protein-calorie malnutrition: Secondary | ICD-10-CM | POA: Diagnosis not present

## 2021-10-24 DIAGNOSIS — R0600 Dyspnea, unspecified: Secondary | ICD-10-CM | POA: Diagnosis not present

## 2021-10-24 DIAGNOSIS — B351 Tinea unguium: Secondary | ICD-10-CM | POA: Diagnosis not present

## 2021-10-24 DIAGNOSIS — G629 Polyneuropathy, unspecified: Secondary | ICD-10-CM | POA: Diagnosis not present

## 2021-10-24 DIAGNOSIS — K219 Gastro-esophageal reflux disease without esophagitis: Secondary | ICD-10-CM | POA: Diagnosis not present

## 2021-10-24 DIAGNOSIS — H04129 Dry eye syndrome of unspecified lacrimal gland: Secondary | ICD-10-CM | POA: Diagnosis not present

## 2021-10-24 DIAGNOSIS — Z681 Body mass index (BMI) 19 or less, adult: Secondary | ICD-10-CM | POA: Diagnosis not present

## 2021-10-24 DIAGNOSIS — Z8701 Personal history of pneumonia (recurrent): Secondary | ICD-10-CM | POA: Diagnosis not present

## 2021-10-24 DIAGNOSIS — H409 Unspecified glaucoma: Secondary | ICD-10-CM | POA: Diagnosis not present

## 2021-10-24 DIAGNOSIS — I679 Cerebrovascular disease, unspecified: Secondary | ICD-10-CM | POA: Diagnosis not present

## 2021-10-24 DIAGNOSIS — E039 Hypothyroidism, unspecified: Secondary | ICD-10-CM | POA: Diagnosis not present

## 2021-10-24 DIAGNOSIS — I509 Heart failure, unspecified: Secondary | ICD-10-CM | POA: Diagnosis not present

## 2021-10-24 DIAGNOSIS — I2510 Atherosclerotic heart disease of native coronary artery without angina pectoris: Secondary | ICD-10-CM | POA: Diagnosis not present

## 2021-10-24 DIAGNOSIS — N3000 Acute cystitis without hematuria: Secondary | ICD-10-CM | POA: Diagnosis not present

## 2021-10-24 DIAGNOSIS — E785 Hyperlipidemia, unspecified: Secondary | ICD-10-CM | POA: Diagnosis not present

## 2021-10-24 DIAGNOSIS — I252 Old myocardial infarction: Secondary | ICD-10-CM | POA: Diagnosis not present

## 2021-10-24 DIAGNOSIS — N183 Chronic kidney disease, stage 3 unspecified: Secondary | ICD-10-CM | POA: Diagnosis not present

## 2021-10-24 DIAGNOSIS — F329 Major depressive disorder, single episode, unspecified: Secondary | ICD-10-CM | POA: Diagnosis not present

## 2021-10-24 DIAGNOSIS — D631 Anemia in chronic kidney disease: Secondary | ICD-10-CM | POA: Diagnosis not present

## 2021-10-24 DIAGNOSIS — J45909 Unspecified asthma, uncomplicated: Secondary | ICD-10-CM | POA: Diagnosis not present

## 2021-10-24 DIAGNOSIS — Z8673 Personal history of transient ischemic attack (TIA), and cerebral infarction without residual deficits: Secondary | ICD-10-CM | POA: Diagnosis not present

## 2021-10-24 DIAGNOSIS — R1310 Dysphagia, unspecified: Secondary | ICD-10-CM | POA: Diagnosis not present

## 2021-10-24 DIAGNOSIS — J479 Bronchiectasis, uncomplicated: Secondary | ICD-10-CM | POA: Diagnosis not present

## 2021-10-24 DIAGNOSIS — I130 Hypertensive heart and chronic kidney disease with heart failure and stage 1 through stage 4 chronic kidney disease, or unspecified chronic kidney disease: Secondary | ICD-10-CM | POA: Diagnosis not present

## 2021-10-25 ENCOUNTER — Encounter: Payer: Self-pay | Admitting: Emergency Medicine

## 2021-10-25 ENCOUNTER — Ambulatory Visit (INDEPENDENT_AMBULATORY_CARE_PROVIDER_SITE_OTHER): Payer: Medicare Other | Admitting: Emergency Medicine

## 2021-10-25 DIAGNOSIS — I10 Essential (primary) hypertension: Secondary | ICD-10-CM | POA: Diagnosis not present

## 2021-10-25 DIAGNOSIS — J479 Bronchiectasis, uncomplicated: Secondary | ICD-10-CM

## 2021-10-25 NOTE — Assessment & Plan Note (Signed)
Sarah [name] did improve some with regard to mucus production, cough after she was treated for an acute flare of her bronchiectasis.  From an overall standpoint however I believe she is beginning to decline.  She has persistent fatigue, weakness, weight loss.  Her mood has been labile.  Her family has involved hospice and I completely agree with this plan.  I did talk to her today about concentrating on her comfort and dignity, quality of life.  She tells me that she agrees with this philosophy, is not afraid of dying.  Her hospice management will primarily be by [facility] care but I have offered to also serve as her hospice attending if needed.  We will plan to continue her current bronchiectasis regimen but certainly reserve the right to narrow focus, concentrate on symptoms

## 2021-10-25 NOTE — Progress Notes (Signed)
Subjective:    Patient ID: Sarah [name], female    DOB: 03-30-1921, 88 y.o.   MRN: [id number]  HPI  Acute OV 09/11/2021 --Sarah [name] is 88 and has a history of bronchiectasis, moderate obstructive lung disease and chronic cough with chronic upper airway irritation.  She is colonized with Pseudomonas.  Has been on chronic prednisone, scheduled azithromycin, chest vest and mucus management.  Today she reports that she has been having URI sx, has tested COVID negative x 2 last on Friday. She has had significant increase in mucous production, now yellow to brown. Very fatigued. SpO2 was noted to be in high 80's over the weekend.   ROV 10/25/2021 --follow-up visit for 88 year old woman with a history of bronchiectasis, upper airway irritation syndrome, moderate COPD and chronic cough.  She is colonized with Pseudomonas.  She has been on chronic prednisone, scheduled azithromycin.  She uses chest vest and scheduled nebs for mucus clearance.  I saw her in late August and she was flaring, had associated hypoxemia for which I started supplemental oxygen 2 L/min.  She was treated with levofloxacin for 10 days and a prednisone taper back down her usual 10 mg.  She was seen in our office in follow-up 9/7 and was feeling better.  She was seen by hospice for the first time yesterday. She continues to feel poorly. She did benefit from the oxygen. Cough and breathing did improve some. She is very fatigued, tired, weak. She is open to hospice care.    Review of Systems As above      Objective:   Physical Exam  Vitals:   10/25/21 1357  BP: 102/70  Pulse: 92  Temp: (!) 96.1 F (35.6 C)  TempSrc: Oral  SpO2: 91%  Weight: 92 lb 6.4 oz (41.9 kg)  Height: 5\' 2"  (1.575 m)     GEN: awake, interacting in wheelchair, kyphotic   HEENT:  OP clear  NECK: no stridor  RESP distant, B insp rhonchi increased from prior, insp squeak B upper zones  CARD:  Regular, no M  Musco: some chronic venous stasis,  trace ankle edema.   Neuro: awake, poor hearing, oriented.  Globally weak       Assessment & Plan:  Bronchiectasis without complication ([place]) Sarah [name] did improve some with regard to mucus production, cough after she was treated for an acute flare of her bronchiectasis.  From an overall standpoint however I believe she is beginning to decline.  She has persistent fatigue, weakness, weight loss.  Her mood has been labile.  Her family has involved hospice and I completely agree with this plan.  I did talk to her today about concentrating on her comfort and dignity, quality of life.  She tells me that she agrees with this philosophy, is not afraid of dying.  Her hospice management will primarily be by [facility] care but I have offered to also serve as her hospice attending if needed.  We will plan to continue her current bronchiectasis regimen but certainly reserve the right to narrow focus, concentrate on symptoms  HTN (hypertension) Patient's blood pressure has been low but acceptable, 100s/ 70s.  That said she has had some weakness, near falls and both patient and daughter are concerned that these blood pressures may be too low for her.  As such I will DC her amlodipine, continue the carvedilol.  This may need to be adjusted in the future as well.  Time spent 40 minutes  Baltazar Apo, MD, PhD 10/25/2021,  4:35 PM [facility] [phone] or if no answer [phone]

## 2021-10-25 NOTE — Assessment & Plan Note (Signed)
Patient's blood pressure has been low but acceptable, 100s/ 70s.  That said she has had some weakness, near falls and both patient and daughter are concerned that these blood pressures may be too low for her.  As such I will DC her amlodipine, continue the carvedilol.  This may need to be adjusted in the future as well.

## 2021-10-25 NOTE — Patient Instructions (Addendum)
I agree with and support your work with hospice care. Please stop your amlodipine Continue carvedilol as you are taking it. If your blood pressure remains low then we can consider decreasing or stopping this as well.  Continue prednisone 10mg  daily Continue azithromycin once daily Continue your nebulizer as you have been doing them Continue your chest vest and flutter valve when you need them  You can keep your oxygen available to use 2L/min as needed for shortness of breath Follow with Dr Lamonte Sakai in 2 months. We can arrange for this tpo be a video visit or phone visit if it is difficult to come in person.

## 2021-10-26 DIAGNOSIS — R0600 Dyspnea, unspecified: Secondary | ICD-10-CM | POA: Diagnosis not present

## 2021-10-26 DIAGNOSIS — J479 Bronchiectasis, uncomplicated: Secondary | ICD-10-CM | POA: Diagnosis not present

## 2021-10-26 DIAGNOSIS — I2510 Atherosclerotic heart disease of native coronary artery without angina pectoris: Secondary | ICD-10-CM | POA: Diagnosis not present

## 2021-10-26 DIAGNOSIS — E43 Unspecified severe protein-calorie malnutrition: Secondary | ICD-10-CM | POA: Diagnosis not present

## 2021-10-26 DIAGNOSIS — J45909 Unspecified asthma, uncomplicated: Secondary | ICD-10-CM | POA: Diagnosis not present

## 2021-10-26 DIAGNOSIS — I252 Old myocardial infarction: Secondary | ICD-10-CM | POA: Diagnosis not present

## 2021-10-30 DIAGNOSIS — J45909 Unspecified asthma, uncomplicated: Secondary | ICD-10-CM | POA: Diagnosis not present

## 2021-10-30 DIAGNOSIS — R0600 Dyspnea, unspecified: Secondary | ICD-10-CM | POA: Diagnosis not present

## 2021-10-30 DIAGNOSIS — I252 Old myocardial infarction: Secondary | ICD-10-CM | POA: Diagnosis not present

## 2021-10-30 DIAGNOSIS — J479 Bronchiectasis, uncomplicated: Secondary | ICD-10-CM | POA: Diagnosis not present

## 2021-10-30 DIAGNOSIS — I2510 Atherosclerotic heart disease of native coronary artery without angina pectoris: Secondary | ICD-10-CM | POA: Diagnosis not present

## 2021-10-30 DIAGNOSIS — E43 Unspecified severe protein-calorie malnutrition: Secondary | ICD-10-CM | POA: Diagnosis not present

## 2021-11-05 DIAGNOSIS — I2510 Atherosclerotic heart disease of native coronary artery without angina pectoris: Secondary | ICD-10-CM | POA: Diagnosis not present

## 2021-11-05 DIAGNOSIS — R0600 Dyspnea, unspecified: Secondary | ICD-10-CM | POA: Diagnosis not present

## 2021-11-05 DIAGNOSIS — I252 Old myocardial infarction: Secondary | ICD-10-CM | POA: Diagnosis not present

## 2021-11-05 DIAGNOSIS — J45909 Unspecified asthma, uncomplicated: Secondary | ICD-10-CM | POA: Diagnosis not present

## 2021-11-05 DIAGNOSIS — E43 Unspecified severe protein-calorie malnutrition: Secondary | ICD-10-CM | POA: Diagnosis not present

## 2021-11-05 DIAGNOSIS — J479 Bronchiectasis, uncomplicated: Secondary | ICD-10-CM | POA: Diagnosis not present

## 2021-11-06 DIAGNOSIS — J45909 Unspecified asthma, uncomplicated: Secondary | ICD-10-CM | POA: Diagnosis not present

## 2021-11-06 DIAGNOSIS — I252 Old myocardial infarction: Secondary | ICD-10-CM | POA: Diagnosis not present

## 2021-11-06 DIAGNOSIS — R0600 Dyspnea, unspecified: Secondary | ICD-10-CM | POA: Diagnosis not present

## 2021-11-06 DIAGNOSIS — E43 Unspecified severe protein-calorie malnutrition: Secondary | ICD-10-CM | POA: Diagnosis not present

## 2021-11-06 DIAGNOSIS — I2510 Atherosclerotic heart disease of native coronary artery without angina pectoris: Secondary | ICD-10-CM | POA: Diagnosis not present

## 2021-11-06 DIAGNOSIS — J479 Bronchiectasis, uncomplicated: Secondary | ICD-10-CM | POA: Diagnosis not present

## 2021-11-07 DIAGNOSIS — E43 Unspecified severe protein-calorie malnutrition: Secondary | ICD-10-CM | POA: Diagnosis not present

## 2021-11-07 DIAGNOSIS — J479 Bronchiectasis, uncomplicated: Secondary | ICD-10-CM | POA: Diagnosis not present

## 2021-11-07 DIAGNOSIS — J45909 Unspecified asthma, uncomplicated: Secondary | ICD-10-CM | POA: Diagnosis not present

## 2021-11-07 DIAGNOSIS — I252 Old myocardial infarction: Secondary | ICD-10-CM | POA: Diagnosis not present

## 2021-11-07 DIAGNOSIS — I2510 Atherosclerotic heart disease of native coronary artery without angina pectoris: Secondary | ICD-10-CM | POA: Diagnosis not present

## 2021-11-07 DIAGNOSIS — R0600 Dyspnea, unspecified: Secondary | ICD-10-CM | POA: Diagnosis not present

## 2021-11-12 DIAGNOSIS — H6122 Impacted cerumen, left ear: Secondary | ICD-10-CM | POA: Diagnosis not present

## 2021-11-13 DIAGNOSIS — J45909 Unspecified asthma, uncomplicated: Secondary | ICD-10-CM | POA: Diagnosis not present

## 2021-11-13 DIAGNOSIS — I2510 Atherosclerotic heart disease of native coronary artery without angina pectoris: Secondary | ICD-10-CM | POA: Diagnosis not present

## 2021-11-13 DIAGNOSIS — R0600 Dyspnea, unspecified: Secondary | ICD-10-CM | POA: Diagnosis not present

## 2021-11-13 DIAGNOSIS — I252 Old myocardial infarction: Secondary | ICD-10-CM | POA: Diagnosis not present

## 2021-11-13 DIAGNOSIS — J479 Bronchiectasis, uncomplicated: Secondary | ICD-10-CM | POA: Diagnosis not present

## 2021-11-13 DIAGNOSIS — E43 Unspecified severe protein-calorie malnutrition: Secondary | ICD-10-CM | POA: Diagnosis not present

## 2021-11-14 DIAGNOSIS — K219 Gastro-esophageal reflux disease without esophagitis: Secondary | ICD-10-CM | POA: Diagnosis not present

## 2021-11-14 DIAGNOSIS — E039 Hypothyroidism, unspecified: Secondary | ICD-10-CM | POA: Diagnosis not present

## 2021-11-14 DIAGNOSIS — I509 Heart failure, unspecified: Secondary | ICD-10-CM | POA: Diagnosis not present

## 2021-11-14 DIAGNOSIS — R1310 Dysphagia, unspecified: Secondary | ICD-10-CM | POA: Diagnosis not present

## 2021-11-14 DIAGNOSIS — D631 Anemia in chronic kidney disease: Secondary | ICD-10-CM | POA: Diagnosis not present

## 2021-11-14 DIAGNOSIS — H04129 Dry eye syndrome of unspecified lacrimal gland: Secondary | ICD-10-CM | POA: Diagnosis not present

## 2021-11-14 DIAGNOSIS — J45909 Unspecified asthma, uncomplicated: Secondary | ICD-10-CM | POA: Diagnosis not present

## 2021-11-14 DIAGNOSIS — Z8701 Personal history of pneumonia (recurrent): Secondary | ICD-10-CM | POA: Diagnosis not present

## 2021-11-14 DIAGNOSIS — I679 Cerebrovascular disease, unspecified: Secondary | ICD-10-CM | POA: Diagnosis not present

## 2021-11-14 DIAGNOSIS — H409 Unspecified glaucoma: Secondary | ICD-10-CM | POA: Diagnosis not present

## 2021-11-14 DIAGNOSIS — F329 Major depressive disorder, single episode, unspecified: Secondary | ICD-10-CM | POA: Diagnosis not present

## 2021-11-14 DIAGNOSIS — I252 Old myocardial infarction: Secondary | ICD-10-CM | POA: Diagnosis not present

## 2021-11-14 DIAGNOSIS — Z8673 Personal history of transient ischemic attack (TIA), and cerebral infarction without residual deficits: Secondary | ICD-10-CM | POA: Diagnosis not present

## 2021-11-14 DIAGNOSIS — E43 Unspecified severe protein-calorie malnutrition: Secondary | ICD-10-CM | POA: Diagnosis not present

## 2021-11-14 DIAGNOSIS — E785 Hyperlipidemia, unspecified: Secondary | ICD-10-CM | POA: Diagnosis not present

## 2021-11-14 DIAGNOSIS — J479 Bronchiectasis, uncomplicated: Secondary | ICD-10-CM | POA: Diagnosis not present

## 2021-11-14 DIAGNOSIS — N183 Chronic kidney disease, stage 3 unspecified: Secondary | ICD-10-CM | POA: Diagnosis not present

## 2021-11-14 DIAGNOSIS — I130 Hypertensive heart and chronic kidney disease with heart failure and stage 1 through stage 4 chronic kidney disease, or unspecified chronic kidney disease: Secondary | ICD-10-CM | POA: Diagnosis not present

## 2021-11-14 DIAGNOSIS — I2510 Atherosclerotic heart disease of native coronary artery without angina pectoris: Secondary | ICD-10-CM | POA: Diagnosis not present

## 2021-11-14 DIAGNOSIS — G629 Polyneuropathy, unspecified: Secondary | ICD-10-CM | POA: Diagnosis not present

## 2021-11-14 DIAGNOSIS — R0600 Dyspnea, unspecified: Secondary | ICD-10-CM | POA: Diagnosis not present

## 2021-11-14 DIAGNOSIS — B351 Tinea unguium: Secondary | ICD-10-CM | POA: Diagnosis not present

## 2021-11-14 DIAGNOSIS — Z681 Body mass index (BMI) 19 or less, adult: Secondary | ICD-10-CM | POA: Diagnosis not present

## 2021-11-19 DIAGNOSIS — R5381 Other malaise: Secondary | ICD-10-CM | POA: Diagnosis not present

## 2021-11-19 DIAGNOSIS — F411 Generalized anxiety disorder: Secondary | ICD-10-CM | POA: Diagnosis not present

## 2021-11-19 DIAGNOSIS — R54 Age-related physical debility: Secondary | ICD-10-CM | POA: Diagnosis not present

## 2021-11-19 DIAGNOSIS — F339 Major depressive disorder, recurrent, unspecified: Secondary | ICD-10-CM | POA: Diagnosis not present

## 2021-11-19 DIAGNOSIS — I10 Essential (primary) hypertension: Secondary | ICD-10-CM | POA: Diagnosis not present

## 2021-11-20 ENCOUNTER — Ambulatory Visit: Payer: Medicare Other | Admitting: Podiatry

## 2021-11-20 DIAGNOSIS — I252 Old myocardial infarction: Secondary | ICD-10-CM | POA: Diagnosis not present

## 2021-11-20 DIAGNOSIS — J45909 Unspecified asthma, uncomplicated: Secondary | ICD-10-CM | POA: Diagnosis not present

## 2021-11-20 DIAGNOSIS — E43 Unspecified severe protein-calorie malnutrition: Secondary | ICD-10-CM | POA: Diagnosis not present

## 2021-11-20 DIAGNOSIS — I2510 Atherosclerotic heart disease of native coronary artery without angina pectoris: Secondary | ICD-10-CM | POA: Diagnosis not present

## 2021-11-20 DIAGNOSIS — J479 Bronchiectasis, uncomplicated: Secondary | ICD-10-CM | POA: Diagnosis not present

## 2021-11-20 DIAGNOSIS — R0600 Dyspnea, unspecified: Secondary | ICD-10-CM | POA: Diagnosis not present

## 2021-11-22 DIAGNOSIS — I252 Old myocardial infarction: Secondary | ICD-10-CM | POA: Diagnosis not present

## 2021-11-22 DIAGNOSIS — J45909 Unspecified asthma, uncomplicated: Secondary | ICD-10-CM | POA: Diagnosis not present

## 2021-11-22 DIAGNOSIS — E43 Unspecified severe protein-calorie malnutrition: Secondary | ICD-10-CM | POA: Diagnosis not present

## 2021-11-22 DIAGNOSIS — J479 Bronchiectasis, uncomplicated: Secondary | ICD-10-CM | POA: Diagnosis not present

## 2021-11-22 DIAGNOSIS — I2510 Atherosclerotic heart disease of native coronary artery without angina pectoris: Secondary | ICD-10-CM | POA: Diagnosis not present

## 2021-11-22 DIAGNOSIS — R0600 Dyspnea, unspecified: Secondary | ICD-10-CM | POA: Diagnosis not present

## 2021-11-27 DIAGNOSIS — I252 Old myocardial infarction: Secondary | ICD-10-CM | POA: Diagnosis not present

## 2021-11-27 DIAGNOSIS — I2510 Atherosclerotic heart disease of native coronary artery without angina pectoris: Secondary | ICD-10-CM | POA: Diagnosis not present

## 2021-11-27 DIAGNOSIS — J479 Bronchiectasis, uncomplicated: Secondary | ICD-10-CM | POA: Diagnosis not present

## 2021-11-27 DIAGNOSIS — E43 Unspecified severe protein-calorie malnutrition: Secondary | ICD-10-CM | POA: Diagnosis not present

## 2021-11-27 DIAGNOSIS — R0600 Dyspnea, unspecified: Secondary | ICD-10-CM | POA: Diagnosis not present

## 2021-11-27 DIAGNOSIS — J45909 Unspecified asthma, uncomplicated: Secondary | ICD-10-CM | POA: Diagnosis not present

## 2021-11-28 DIAGNOSIS — E43 Unspecified severe protein-calorie malnutrition: Secondary | ICD-10-CM | POA: Diagnosis not present

## 2021-11-28 DIAGNOSIS — I2510 Atherosclerotic heart disease of native coronary artery without angina pectoris: Secondary | ICD-10-CM | POA: Diagnosis not present

## 2021-11-28 DIAGNOSIS — J479 Bronchiectasis, uncomplicated: Secondary | ICD-10-CM | POA: Diagnosis not present

## 2021-11-28 DIAGNOSIS — R0600 Dyspnea, unspecified: Secondary | ICD-10-CM | POA: Diagnosis not present

## 2021-11-28 DIAGNOSIS — I252 Old myocardial infarction: Secondary | ICD-10-CM | POA: Diagnosis not present

## 2021-11-28 DIAGNOSIS — J45909 Unspecified asthma, uncomplicated: Secondary | ICD-10-CM | POA: Diagnosis not present

## 2021-12-04 DIAGNOSIS — I252 Old myocardial infarction: Secondary | ICD-10-CM | POA: Diagnosis not present

## 2021-12-04 DIAGNOSIS — J479 Bronchiectasis, uncomplicated: Secondary | ICD-10-CM | POA: Diagnosis not present

## 2021-12-04 DIAGNOSIS — J45909 Unspecified asthma, uncomplicated: Secondary | ICD-10-CM | POA: Diagnosis not present

## 2021-12-04 DIAGNOSIS — I2510 Atherosclerotic heart disease of native coronary artery without angina pectoris: Secondary | ICD-10-CM | POA: Diagnosis not present

## 2021-12-04 DIAGNOSIS — E43 Unspecified severe protein-calorie malnutrition: Secondary | ICD-10-CM | POA: Diagnosis not present

## 2021-12-04 DIAGNOSIS — R0600 Dyspnea, unspecified: Secondary | ICD-10-CM | POA: Diagnosis not present

## 2021-12-11 DIAGNOSIS — J45909 Unspecified asthma, uncomplicated: Secondary | ICD-10-CM | POA: Diagnosis not present

## 2021-12-11 DIAGNOSIS — J479 Bronchiectasis, uncomplicated: Secondary | ICD-10-CM | POA: Diagnosis not present

## 2021-12-11 DIAGNOSIS — I2510 Atherosclerotic heart disease of native coronary artery without angina pectoris: Secondary | ICD-10-CM | POA: Diagnosis not present

## 2021-12-11 DIAGNOSIS — E43 Unspecified severe protein-calorie malnutrition: Secondary | ICD-10-CM | POA: Diagnosis not present

## 2021-12-11 DIAGNOSIS — I252 Old myocardial infarction: Secondary | ICD-10-CM | POA: Diagnosis not present

## 2021-12-11 DIAGNOSIS — R0600 Dyspnea, unspecified: Secondary | ICD-10-CM | POA: Diagnosis not present

## 2021-12-14 DIAGNOSIS — E43 Unspecified severe protein-calorie malnutrition: Secondary | ICD-10-CM | POA: Diagnosis not present

## 2021-12-14 DIAGNOSIS — G629 Polyneuropathy, unspecified: Secondary | ICD-10-CM | POA: Diagnosis not present

## 2021-12-14 DIAGNOSIS — K219 Gastro-esophageal reflux disease without esophagitis: Secondary | ICD-10-CM | POA: Diagnosis not present

## 2021-12-14 DIAGNOSIS — H04129 Dry eye syndrome of unspecified lacrimal gland: Secondary | ICD-10-CM | POA: Diagnosis not present

## 2021-12-14 DIAGNOSIS — B351 Tinea unguium: Secondary | ICD-10-CM | POA: Diagnosis not present

## 2021-12-14 DIAGNOSIS — N183 Chronic kidney disease, stage 3 unspecified: Secondary | ICD-10-CM | POA: Diagnosis not present

## 2021-12-14 DIAGNOSIS — Z8673 Personal history of transient ischemic attack (TIA), and cerebral infarction without residual deficits: Secondary | ICD-10-CM | POA: Diagnosis not present

## 2021-12-14 DIAGNOSIS — I679 Cerebrovascular disease, unspecified: Secondary | ICD-10-CM | POA: Diagnosis not present

## 2021-12-14 DIAGNOSIS — Z681 Body mass index (BMI) 19 or less, adult: Secondary | ICD-10-CM | POA: Diagnosis not present

## 2021-12-14 DIAGNOSIS — E039 Hypothyroidism, unspecified: Secondary | ICD-10-CM | POA: Diagnosis not present

## 2021-12-14 DIAGNOSIS — I2510 Atherosclerotic heart disease of native coronary artery without angina pectoris: Secondary | ICD-10-CM | POA: Diagnosis not present

## 2021-12-14 DIAGNOSIS — H409 Unspecified glaucoma: Secondary | ICD-10-CM | POA: Diagnosis not present

## 2021-12-14 DIAGNOSIS — I130 Hypertensive heart and chronic kidney disease with heart failure and stage 1 through stage 4 chronic kidney disease, or unspecified chronic kidney disease: Secondary | ICD-10-CM | POA: Diagnosis not present

## 2021-12-14 DIAGNOSIS — J479 Bronchiectasis, uncomplicated: Secondary | ICD-10-CM | POA: Diagnosis not present

## 2021-12-14 DIAGNOSIS — F329 Major depressive disorder, single episode, unspecified: Secondary | ICD-10-CM | POA: Diagnosis not present

## 2021-12-14 DIAGNOSIS — I509 Heart failure, unspecified: Secondary | ICD-10-CM | POA: Diagnosis not present

## 2021-12-14 DIAGNOSIS — D631 Anemia in chronic kidney disease: Secondary | ICD-10-CM | POA: Diagnosis not present

## 2021-12-14 DIAGNOSIS — R1310 Dysphagia, unspecified: Secondary | ICD-10-CM | POA: Diagnosis not present

## 2021-12-14 DIAGNOSIS — R0600 Dyspnea, unspecified: Secondary | ICD-10-CM | POA: Diagnosis not present

## 2021-12-14 DIAGNOSIS — J45909 Unspecified asthma, uncomplicated: Secondary | ICD-10-CM | POA: Diagnosis not present

## 2021-12-14 DIAGNOSIS — E785 Hyperlipidemia, unspecified: Secondary | ICD-10-CM | POA: Diagnosis not present

## 2021-12-14 DIAGNOSIS — Z8701 Personal history of pneumonia (recurrent): Secondary | ICD-10-CM | POA: Diagnosis not present

## 2021-12-14 DIAGNOSIS — I252 Old myocardial infarction: Secondary | ICD-10-CM | POA: Diagnosis not present

## 2021-12-16 DIAGNOSIS — J45909 Unspecified asthma, uncomplicated: Secondary | ICD-10-CM | POA: Diagnosis not present

## 2021-12-16 DIAGNOSIS — I252 Old myocardial infarction: Secondary | ICD-10-CM | POA: Diagnosis not present

## 2021-12-16 DIAGNOSIS — J479 Bronchiectasis, uncomplicated: Secondary | ICD-10-CM | POA: Diagnosis not present

## 2021-12-16 DIAGNOSIS — I2510 Atherosclerotic heart disease of native coronary artery without angina pectoris: Secondary | ICD-10-CM | POA: Diagnosis not present

## 2021-12-16 DIAGNOSIS — E43 Unspecified severe protein-calorie malnutrition: Secondary | ICD-10-CM | POA: Diagnosis not present

## 2021-12-16 DIAGNOSIS — R0600 Dyspnea, unspecified: Secondary | ICD-10-CM | POA: Diagnosis not present

## 2021-12-18 ENCOUNTER — Other Ambulatory Visit: Payer: Self-pay | Admitting: Nurse Practitioner

## 2021-12-18 DIAGNOSIS — R0600 Dyspnea, unspecified: Secondary | ICD-10-CM | POA: Diagnosis not present

## 2021-12-18 DIAGNOSIS — I2510 Atherosclerotic heart disease of native coronary artery without angina pectoris: Secondary | ICD-10-CM | POA: Diagnosis not present

## 2021-12-18 DIAGNOSIS — J45909 Unspecified asthma, uncomplicated: Secondary | ICD-10-CM | POA: Diagnosis not present

## 2021-12-18 DIAGNOSIS — J479 Bronchiectasis, uncomplicated: Secondary | ICD-10-CM | POA: Diagnosis not present

## 2021-12-18 DIAGNOSIS — I252 Old myocardial infarction: Secondary | ICD-10-CM | POA: Diagnosis not present

## 2021-12-18 DIAGNOSIS — E43 Unspecified severe protein-calorie malnutrition: Secondary | ICD-10-CM | POA: Diagnosis not present

## 2021-12-18 DIAGNOSIS — J471 Bronchiectasis with (acute) exacerbation: Secondary | ICD-10-CM

## 2021-12-24 DIAGNOSIS — I2510 Atherosclerotic heart disease of native coronary artery without angina pectoris: Secondary | ICD-10-CM | POA: Diagnosis not present

## 2021-12-24 DIAGNOSIS — R0600 Dyspnea, unspecified: Secondary | ICD-10-CM | POA: Diagnosis not present

## 2021-12-24 DIAGNOSIS — I252 Old myocardial infarction: Secondary | ICD-10-CM | POA: Diagnosis not present

## 2021-12-24 DIAGNOSIS — J479 Bronchiectasis, uncomplicated: Secondary | ICD-10-CM | POA: Diagnosis not present

## 2021-12-24 DIAGNOSIS — J45909 Unspecified asthma, uncomplicated: Secondary | ICD-10-CM | POA: Diagnosis not present

## 2021-12-24 DIAGNOSIS — E43 Unspecified severe protein-calorie malnutrition: Secondary | ICD-10-CM | POA: Diagnosis not present

## 2021-12-25 ENCOUNTER — Telehealth (INDEPENDENT_AMBULATORY_CARE_PROVIDER_SITE_OTHER): Payer: Medicare Other | Admitting: Emergency Medicine

## 2021-12-25 ENCOUNTER — Encounter: Payer: Self-pay | Admitting: Emergency Medicine

## 2021-12-25 ENCOUNTER — Telehealth: Payer: Self-pay | Admitting: Emergency Medicine

## 2021-12-25 DIAGNOSIS — J479 Bronchiectasis, uncomplicated: Secondary | ICD-10-CM | POA: Diagnosis not present

## 2021-12-25 MED ORDER — PREDNISONE 10 MG PO TABS: 10.0000 mg | ORAL_TABLET | Freq: Every day | ORAL | 0 refills | Status: DC

## 2021-12-25 NOTE — Telephone Encounter (Signed)
Called daughter back and she states that her mother is not feeling well today and is needing the visit to be mychart. Changed to mychart. Nothing further needed

## 2021-12-25 NOTE — Progress Notes (Signed)
Virtual Visit via Video Note  I connected with Sarah [name] on 12/25/21 at  1:45 PM EST by a video enabled telemedicine application and verified that I am speaking with the correct person using two identifiers.  Location: Patient: Home Provider: Office   I discussed the limitations of evaluation and management by telemedicine and the availability of in person appointments. The patient expressed understanding and agreed to proceed.  History of Present Illness: Mrs. Sarah [name] is 75 and has a history of chronic cough, bronchiectasis, chronic upper airway irritation syndrome and moderate COPD.  She is colonized with Pseudomonas.  She is managed with chronic prednisone and scheduled azithromycin.  I last saw her in early October.  At that time family was beginning to involve hospice and I agree with this.   Observations/Objective: She reports that she continues to feel very weak, has very low energy. She is using oxygen reliably. Minimal cough or sputum. She is back on amlodipine qod. ? Whether she is having relative hypotension when these episodes of low energy are happening. She is getting support from [employer], hospice RN once a week.   Assessment and Plan: Her BTX does not appear to be flaring.  She does have severe intermittent weakness and fatigue.  Question whether this might be correlating with swings in her blood pressure.   We discussed increasing the daily prednisone > will change to 20mg  qd for 2 weeks and then see her again w video visit to determine whether this has been beneficial.  Discussed trying to correlate her low energy with days when she takes her amlodipine.  She will work on tracking this with her daughters so we can review next time  Follow Up Instructions: Video visit w RB or APP in 2 weeks.    I discussed the assessment and treatment plan with the patient. The patient was provided an opportunity to ask questions and all were answered. The patient agreed with the plan  and demonstrated an understanding of the instructions.   The patient was advised to call back or seek an in-person evaluation if the symptoms worsen or if the condition fails to improve as anticipated.  I provided 25 minutes of non-face-to-face time during this encounter.   , MD

## 2021-12-26 DIAGNOSIS — I2510 Atherosclerotic heart disease of native coronary artery without angina pectoris: Secondary | ICD-10-CM | POA: Diagnosis not present

## 2021-12-26 DIAGNOSIS — I252 Old myocardial infarction: Secondary | ICD-10-CM | POA: Diagnosis not present

## 2021-12-26 DIAGNOSIS — J45909 Unspecified asthma, uncomplicated: Secondary | ICD-10-CM | POA: Diagnosis not present

## 2021-12-26 DIAGNOSIS — R0600 Dyspnea, unspecified: Secondary | ICD-10-CM | POA: Diagnosis not present

## 2021-12-26 DIAGNOSIS — E43 Unspecified severe protein-calorie malnutrition: Secondary | ICD-10-CM | POA: Diagnosis not present

## 2021-12-26 DIAGNOSIS — J479 Bronchiectasis, uncomplicated: Secondary | ICD-10-CM | POA: Diagnosis not present

## 2022-01-01 DIAGNOSIS — I252 Old myocardial infarction: Secondary | ICD-10-CM | POA: Diagnosis not present

## 2022-01-01 DIAGNOSIS — E43 Unspecified severe protein-calorie malnutrition: Secondary | ICD-10-CM | POA: Diagnosis not present

## 2022-01-01 DIAGNOSIS — R0600 Dyspnea, unspecified: Secondary | ICD-10-CM | POA: Diagnosis not present

## 2022-01-01 DIAGNOSIS — I2510 Atherosclerotic heart disease of native coronary artery without angina pectoris: Secondary | ICD-10-CM | POA: Diagnosis not present

## 2022-01-01 DIAGNOSIS — J45909 Unspecified asthma, uncomplicated: Secondary | ICD-10-CM | POA: Diagnosis not present

## 2022-01-01 DIAGNOSIS — J479 Bronchiectasis, uncomplicated: Secondary | ICD-10-CM | POA: Diagnosis not present

## 2022-01-08 DIAGNOSIS — I252 Old myocardial infarction: Secondary | ICD-10-CM | POA: Diagnosis not present

## 2022-01-08 DIAGNOSIS — R0600 Dyspnea, unspecified: Secondary | ICD-10-CM | POA: Diagnosis not present

## 2022-01-08 DIAGNOSIS — E43 Unspecified severe protein-calorie malnutrition: Secondary | ICD-10-CM | POA: Diagnosis not present

## 2022-01-08 DIAGNOSIS — I2510 Atherosclerotic heart disease of native coronary artery without angina pectoris: Secondary | ICD-10-CM | POA: Diagnosis not present

## 2022-01-08 DIAGNOSIS — J479 Bronchiectasis, uncomplicated: Secondary | ICD-10-CM | POA: Diagnosis not present

## 2022-01-08 DIAGNOSIS — J45909 Unspecified asthma, uncomplicated: Secondary | ICD-10-CM | POA: Diagnosis not present

## 2022-01-11 DIAGNOSIS — R0600 Dyspnea, unspecified: Secondary | ICD-10-CM | POA: Diagnosis not present

## 2022-01-11 DIAGNOSIS — I252 Old myocardial infarction: Secondary | ICD-10-CM | POA: Diagnosis not present

## 2022-01-11 DIAGNOSIS — J479 Bronchiectasis, uncomplicated: Secondary | ICD-10-CM | POA: Diagnosis not present

## 2022-01-11 DIAGNOSIS — I2510 Atherosclerotic heart disease of native coronary artery without angina pectoris: Secondary | ICD-10-CM | POA: Diagnosis not present

## 2022-01-11 DIAGNOSIS — J45909 Unspecified asthma, uncomplicated: Secondary | ICD-10-CM | POA: Diagnosis not present

## 2022-01-11 DIAGNOSIS — E43 Unspecified severe protein-calorie malnutrition: Secondary | ICD-10-CM | POA: Diagnosis not present

## 2022-01-14 DIAGNOSIS — B351 Tinea unguium: Secondary | ICD-10-CM | POA: Diagnosis not present

## 2022-01-14 DIAGNOSIS — I679 Cerebrovascular disease, unspecified: Secondary | ICD-10-CM | POA: Diagnosis not present

## 2022-01-14 DIAGNOSIS — R0600 Dyspnea, unspecified: Secondary | ICD-10-CM | POA: Diagnosis not present

## 2022-01-14 DIAGNOSIS — E43 Unspecified severe protein-calorie malnutrition: Secondary | ICD-10-CM | POA: Diagnosis not present

## 2022-01-14 DIAGNOSIS — E785 Hyperlipidemia, unspecified: Secondary | ICD-10-CM | POA: Diagnosis not present

## 2022-01-14 DIAGNOSIS — Z8673 Personal history of transient ischemic attack (TIA), and cerebral infarction without residual deficits: Secondary | ICD-10-CM | POA: Diagnosis not present

## 2022-01-14 DIAGNOSIS — N183 Chronic kidney disease, stage 3 unspecified: Secondary | ICD-10-CM | POA: Diagnosis not present

## 2022-01-14 DIAGNOSIS — J45909 Unspecified asthma, uncomplicated: Secondary | ICD-10-CM | POA: Diagnosis not present

## 2022-01-14 DIAGNOSIS — I2510 Atherosclerotic heart disease of native coronary artery without angina pectoris: Secondary | ICD-10-CM | POA: Diagnosis not present

## 2022-01-14 DIAGNOSIS — H409 Unspecified glaucoma: Secondary | ICD-10-CM | POA: Diagnosis not present

## 2022-01-14 DIAGNOSIS — R1310 Dysphagia, unspecified: Secondary | ICD-10-CM | POA: Diagnosis not present

## 2022-01-14 DIAGNOSIS — I509 Heart failure, unspecified: Secondary | ICD-10-CM | POA: Diagnosis not present

## 2022-01-14 DIAGNOSIS — I130 Hypertensive heart and chronic kidney disease with heart failure and stage 1 through stage 4 chronic kidney disease, or unspecified chronic kidney disease: Secondary | ICD-10-CM | POA: Diagnosis not present

## 2022-01-14 DIAGNOSIS — Z8701 Personal history of pneumonia (recurrent): Secondary | ICD-10-CM | POA: Diagnosis not present

## 2022-01-14 DIAGNOSIS — J479 Bronchiectasis, uncomplicated: Secondary | ICD-10-CM | POA: Diagnosis not present

## 2022-01-14 DIAGNOSIS — F329 Major depressive disorder, single episode, unspecified: Secondary | ICD-10-CM | POA: Diagnosis not present

## 2022-01-14 DIAGNOSIS — K219 Gastro-esophageal reflux disease without esophagitis: Secondary | ICD-10-CM | POA: Diagnosis not present

## 2022-01-14 DIAGNOSIS — I252 Old myocardial infarction: Secondary | ICD-10-CM | POA: Diagnosis not present

## 2022-01-14 DIAGNOSIS — Z681 Body mass index (BMI) 19 or less, adult: Secondary | ICD-10-CM | POA: Diagnosis not present

## 2022-01-14 DIAGNOSIS — D631 Anemia in chronic kidney disease: Secondary | ICD-10-CM | POA: Diagnosis not present

## 2022-01-14 DIAGNOSIS — E039 Hypothyroidism, unspecified: Secondary | ICD-10-CM | POA: Diagnosis not present

## 2022-01-14 DIAGNOSIS — H04129 Dry eye syndrome of unspecified lacrimal gland: Secondary | ICD-10-CM | POA: Diagnosis not present

## 2022-01-14 DIAGNOSIS — G629 Polyneuropathy, unspecified: Secondary | ICD-10-CM | POA: Diagnosis not present

## 2022-01-15 DIAGNOSIS — I2510 Atherosclerotic heart disease of native coronary artery without angina pectoris: Secondary | ICD-10-CM | POA: Diagnosis not present

## 2022-01-15 DIAGNOSIS — I252 Old myocardial infarction: Secondary | ICD-10-CM | POA: Diagnosis not present

## 2022-01-15 DIAGNOSIS — J479 Bronchiectasis, uncomplicated: Secondary | ICD-10-CM | POA: Diagnosis not present

## 2022-01-15 DIAGNOSIS — R0600 Dyspnea, unspecified: Secondary | ICD-10-CM | POA: Diagnosis not present

## 2022-01-15 DIAGNOSIS — J45909 Unspecified asthma, uncomplicated: Secondary | ICD-10-CM | POA: Diagnosis not present

## 2022-01-15 DIAGNOSIS — E43 Unspecified severe protein-calorie malnutrition: Secondary | ICD-10-CM | POA: Diagnosis not present

## 2022-01-16 ENCOUNTER — Other Ambulatory Visit: Payer: Self-pay | Admitting: Nurse Practitioner

## 2022-01-16 ENCOUNTER — Encounter: Payer: Self-pay | Admitting: Nurse Practitioner

## 2022-01-16 ENCOUNTER — Telehealth (INDEPENDENT_AMBULATORY_CARE_PROVIDER_SITE_OTHER): Payer: Medicare Other | Admitting: Nurse Practitioner

## 2022-01-16 VITALS — HR 105 | Ht 63.0 in | Wt 90.0 lb

## 2022-01-16 DIAGNOSIS — J479 Bronchiectasis, uncomplicated: Secondary | ICD-10-CM

## 2022-01-16 DIAGNOSIS — R54 Age-related physical debility: Secondary | ICD-10-CM

## 2022-01-16 DIAGNOSIS — J471 Bronchiectasis with (acute) exacerbation: Secondary | ICD-10-CM

## 2022-01-16 NOTE — Progress Notes (Unsigned)
Patient ID: Sarah [name], female     DOB: 05-30-1921, 89 y.o.      MRN: [id number]  Chief Complaint  Patient presents with   Follow-up    Pt f/u she is having some SOB, she feels like the prednisone is helping her and giving her more energy. She has some questions.    Virtual Visit via Video Note  I connected with Sarah [name] on 01/17/22 at  2:30 PM EST by a video enabled telemedicine application and verified that I am speaking with the correct person using two identifiers.  Location: Patient: Home Provider: Office   I discussed the limitations of evaluation and management by telemedicine and the availability of in person appointments. The patient expressed understanding and agreed to proceed.  History of Present Illness: 89 year old old female, never smoker followed for asthma, bronchiectasis with pseudomonal colonization, allergic rhinitis, chronic cough. She is a patient of Dr. Agustina Caroli and last seen via virtual visit 12/25/2021. Past medical history significant for HTN, NSTEMI, stroke, CAD, chronic diastolic CHF, hypothyroid, arthritis, adrenal insuffiencey, CKD stage III, GERD, depression/anxiety, HLD, IDA, macular degeneration, prediabetes.  TEST/EVENTS:  02/24/2012 CT chest without contrast: Mediastinal lymph nodes measure up to 12 mm in the precarinal station, as before.  Moderate hiatal hernia.  Trace amount of loculated pleural fluid in posterior medial inferior left hemithorax.  Mild biapical pleural-parenchymal scarring.  Interval progression of bibasilar predominant peribronchovascular nodularity, areas of airspace consolidation and scattered bronchiectasis.  Findings suggestive of MAC. 07/02/2019 CTA chest: Scattered low-attenuation mediastinal and hilar lymph nodes, many subcentimeter.  Largest node is 13 mm precarinal node, stable.  Airways diffusely thickened and many towards the bases appear fluid-filled with some chronic architectural distortion and scarring in LLL,  lingula and RML.  Increasing consolidative opacities present in right lung base.  Additional patchy consolidation with tree-in-bud nodularity is present in the right upper lobe as well.  Some mosaic attenuation may reflect further small airways disease or air trapping.  No PE.  Large hiatal hernia. 11/15/2018 CXR 1 view: Unchanged chronic increased opacity at the left lung base, likely atelectasis.  No new acute abnormality. 01/04/2021 CXR 2 View: large hiatal hernia. Lungs clear without acute process.  05/21/2021 CXR: stable chronic lung disease with diffuse interstitial prominence, btx, and scattered parenchymal opacities. No new airspace disease.  09/20/2021: OV with Mandalyn Pasqua,NP. She was seen 8/29 and treated for bronchiectatic flare with levaquin course and prednisone taper, which she has completed. She is clinically improving. Her daughter reports that she looks much better and closer to her baseline. She still has a productive cough which is less copious and no longer purulent. Encouraged her to continue mucociliary clearance therapies. Cough is strong. She will also continue albuterol nebs at least three times a day. Activity encouraged.    10/25/2021: OV with [name]. Mucus production has improved. However, she has begun to decline overall. Persistent fatigue, weakness, weight loss. Family has involved hospice. Encouraged her to focus on comfort and dignity, quality of life. Plan to continue current btx regimen but certainly reserve the right to narrow focus, concentrate on symptoms. BPs have been low. Plan to d/c amlodipine, continue carvedilol.  12/25/2021: Virtual visit with [name]. Still struggling with excessive fatigue and weakness. Minimal cough or sputum. Back on amlodipine? Question weather she is having relative hypotension when these episodes of low energy are occurring. Advised to keep track of BP when these episodes occur. BTX does not appear to be flaring. Will trial  change to 20 mg of  prednisone daily.  01/16/2022: Today - follow up Patient presents today for follow up via virtual visit. Her daughter is with her and helps provide her history. She overall feels relatively similar compared to the last time she was seen by [name]. Her cough is stable with minimal mucus production. She is still having weakness and episodes of excessive fatigue. She's not sure if the increased dose of prednisone has made much of a difference. She is still taking her amlodipine daily. BP doesn't seem to drop when she gets tired, but she runs in the low 100's. It makes her very nervous to come off of this as she's worried she'll have a stroke. She did talk to her PCP about taking the amlodipine every other day. No fevers, chills, hemoptysis, dizziness. She is using her mucociliary clearance therapies, when needed but not consistently.   Allergies  Allergen Reactions   Codeine Anaphylaxis   Hydrocodone Nausea And Vomiting and Other (See Comments)    SYNCOPE AND BRADYCARDIA Other reaction(s): Unknown   Isosorbide Other (See Comments)    BRADYCARDIA   Isosorbide Nitrate Other (See Comments)    BRADYCARDIA   Oxycodone Palpitations    Rapid heart beat Other reaction(s): vomiting   Acetaminophen-Codeine Other (See Comments)    Other reaction(s): angioedema Other reaction(s): angioedema Other reaction(s): angioedema Other reaction(s): angioedema   Amoxicillin-Pot Clavulanate Diarrhea    Other reaction(s): watery incontinent diarrhea   Butalbital-Aspirin-Caffeine Swelling and Other (See Comments)    Facial swelling  Other reaction(s): angioedema   Clarithromycin Nausea And Vomiting    Has patient had a PCN reaction causing immediate rash, facial/tongue/throat swelling, SOB or lightheadedness with hypotension: Unknown Has patient had a PCN reaction causing severe rash involving mucus membranes or skin necrosis: Unknown Has patient had a PCN reaction that required hospitalization: Unknown Has  patient had a PCN reaction occurring within the last 10 years: Unknown If all of the above answers are "NO", then may proceed with Cephalosporin use.  Other reaction(s): vomiting   Levofloxacin Nausea And Vomiting and Other (See Comments)    SYNCOPE ALSO Other reaction(s): vomiting   Doxycycline Hyclate     Other reaction(s): Unknown   Hydrocodone-Acetaminophen    Isosorbide Dinitrate    Doxycycline Other (See Comments)    Sweats and aches   Immunization History  Administered Date(s) Administered   Fluad Quad(high Dose 65+) 10/08/2018, 10/11/2019, 10/13/2020   Influenza Split 10/15/2011, 10/14/2012, 09/15/2013, 10/29/2016, 10/08/2018, 10/11/2019   Influenza Whole 10/17/2008, 11/14/2009, 11/13/2010   Influenza, High Dose Seasonal PF 11/21/2015, 10/29/2016, 09/22/2017   Influenza,inj,Quad PF,6+ Mos 09/17/2013, 10/07/2014   Influenza,inj,quad, With Preservative 11/08/2014   Influenza-Unspecified 10/13/2020, 10/10/2021   Moderna Covid-19 Vaccine Bivalent Booster 15yr & up 09/21/2020   Moderna Sars-Covid-2 Vaccination 02/04/2019, 03/04/2019, 10/15/2019, 06/07/2020   PPD Test 03/28/2014   Pneumococcal Conjugate-13 04/07/2014   Pneumococcal Polysaccharide-23 02/19/1999   Tdap 09/21/2012   Past Medical History:  Diagnosis Date   Allergic rhinitis    Anxiety    Anxiety attacks   Arthritis    "a little; in my back and hands" (04/15/2012)   Asthma    Bronchiectasis    with history of Legionaires with chronic rales/rhonchi   Chest pain at rest    Daily headache    "over the last week" 04/15/2012    Depression    Diastolic dysfunction    a. per echo 08/2010 with normal LV function;  b. 06/2011 Echo: EF 65-70%  GERD (gastroesophageal reflux disease)    "just recently" (04/15/2012)   H/O hiatal hernia    H/O Legionnaire's disease 11/1976   History of blood transfusion 1972   "w/hysterectomy" (04/15/2012)   Hypercholesteremia    Hypertension    Negative renal duplex in December of 2012    Hypothyroidism    Idiopathic peripheral neuropathy    MAC (mycobacterium avium-intracellulare complex)    Migraines    "outgrew them" (04/15/2012)   NSTEMI (non-ST elevated myocardial infarction) ([place])    a. Normal coronaries per cath August 2012 and negative CT angio for PE;  b. 06/2011 Repeat admission w/ chest pain and elevated troponin's, CTA Chest w/o PE   Osteopenia 02/2011   t score - 2.1   Pneumonia    "recurrent" (04/15/2012)   Pollen allergies    Shortness of breath    "sometimes just lying down" (04/15/2012)   Stroke ([place]) 06/2014   cerebellum    Tobacco History: Social History   Tobacco Use  Smoking Status Never  Smokeless Tobacco Never   Counseling given: Not Answered   Outpatient Medications Prior to Visit  Medication Sig Dispense Refill   acetaminophen (TYLENOL) 650 MG CR tablet      albuterol (PROVENTIL) (2.5 MG/3ML) 0.083% nebulizer solution INHALE 3 ML BY NEBULIZATION EVERY 6 HOURS AS NEEDED FOR WHEEZING OR SHORTNESS OF BREATH (Patient taking differently: Taking three times a day) 225 mL 3   albuterol (VENTOLIN HFA) 108 (90 Base) MCG/ACT inhaler Inhale 2 puffs into the lungs every 6 (six) hours as needed for wheezing or shortness of breath. 8 g 6   ALPRAZolam (XANAX) 0.5 MG tablet Take 1 tablet (0.5 mg total) by mouth as directed. Take 1 tablet (0.5 mg) BID at (8 am & 8 pm) & DAILY PRN FOR ANXIETY 10 tablet 0   amLODipine (NORVASC) 2.5 MG tablet Take 2.5 mg by mouth daily.     azithromycin (ZITHROMAX) 250 MG tablet TAKE 1 TABLET BY MOUTH EVERY DAY 90 tablet 1   benzonatate (TESSALON) 200 MG capsule Take 1 capsule (200 mg total) by mouth 3 (three) times daily as needed for cough. 30 capsule 1   Brinzolamide-Brimonidine (SIMBRINZA) 1-0.2 % SUSP      calcium carbonate (TUMS - DOSED IN MG ELEMENTAL CALCIUM) 500 MG chewable tablet 1 tablet as needed for indigestion or heartburn     carvedilol (COREG) 6.25 MG tablet      clopidogrel (PLAVIX) 75 MG tablet       DULoxetine (CYMBALTA) 20 MG capsule duloxetine 20 mg capsule,delayed release  TAKE 1 CAPSULE BY MOUTH THREE TIMES DAILY     DULoxetine (CYMBALTA) 30 MG capsule TAKE 1 CAPSULE     Elastic Bandages & Supports (JOBST ACTIVE 20-30MMHG MEDIUM) MISC See admin instructions.     escitalopram (LEXAPRO) 5 MG tablet Take 1 tablet by mouth daily.     Iron-FA-B Cmp-C-Biot-Probiotic (FUSION PLUS) CAPS      latanoprost (XALATAN) 0.005 % ophthalmic solution      levothyroxine (SYNTHROID) 50 MCG tablet 1 tablet in the morning on an empty stomach     mupirocin ointment (BACTROBAN) 2 % Apply to affected digits once daily. (Patient taking differently: Apply 1 application  topically daily as needed (dry skin).) 30 g 1   nitrofurantoin (MACRODANTIN) 100 MG capsule 1 capsule at bedtime with food or milk     NONFORMULARY OR COMPOUNDED ITEM [facility]:  Neuropathy Cream #11 - Bupivacaine 1%, Doxepin 3%, Gabapentin 6%, Pentoxifylline 3%, Topiramate 1%.  Apply 1-2 grams to affected areas 3-4 times a day. (Patient taking differently: [facility]:  Neuropathy Cream #11 - Bupivacaine 1%, Doxepin 3%, Gabapentin 6%, Pentoxifylline 3%, Topiramate 1%. Apply 1-2 grams to affected areas 3-4 times a day PRN for Dry Skin) 100 each 5   pantoprazole (PROTONIX) 40 MG tablet Take 1 tablet by mouth 2 (two) times daily.     polyethylene glycol (MIRALAX / GLYCOLAX) 17 g packet Take 17 g by mouth daily as needed for mild constipation. 14 each 0   predniSONE (DELTASONE) 10 MG tablet Take 1 tablet (10 mg total) by mouth daily with breakfast. (Patient taking differently: Take 20 mg by mouth daily with breakfast.) 21 tablet 0   ROCKLATAN 0.02-0.005 % SOLN Place 1 drop into both eyes at bedtime.     senna (SENOKOT) 8.6 MG TABS tablet Take 1 tablet (8.6 mg total) by mouth daily. 120 tablet 0   Simethicone (GAS-X PO)      levofloxacin (LEVAQUIN) 500 MG tablet Take 1 tablet (500 mg total) by mouth daily. 10 tablet 0   guaiFENesin  (MUCINEX) 600 MG 12 hr tablet Take 2 tablets (1,200 mg total) by mouth 2 (two) times daily. (Patient not taking: Reported on 01/16/2022) 60 tablet 3   traMADol (ULTRAM) 50 MG tablet Take 1 tablet (50 mg total) by mouth every 6 (six) hours as needed for moderate pain. (Patient not taking: Reported on 01/16/2022) 30 tablet 0   predniSONE (DELTASONE) 10 MG tablet TAKE 1 TABLET (10 MG TOTAL) BY MOUTH DAILY WITH BREAKFAST. 90 tablet 1   sodium chloride HYPERTONIC 3 % nebulizer solution Take by nebulization 2 (two) times daily. (Patient not taking: Reported on 12/25/2021) 75 mL 5   No facility-administered medications prior to visit.     Review of Systems:   Constitutional: No weight loss or gain, night sweats, fevers, chills. +fatigue, lassitude. HEENT: No headaches, difficulty swallowing, tooth/dental problems, or sore throat. No sneezing, itching, ear ache, nasal congestion, or post nasal drip CV:  No chest pain, orthopnea, PND, swelling in lower extremities, anasarca, dizziness, palpitations, syncope Resp: +cough, minimal production. No increased shortness of breath with exertion or at rest. No productive or non-productive. No hemoptysis. No wheezing.  No chest wall deformity GI: +decreased appetite. No heartburn, indigestion, abdominal pain, nausea, vomiting Skin: No rash, lesions, ulcerations MSK:  No joint pain or swelling.   Neuro: No dizziness or lightheadedness.  Psych: Mood stable.   Observations/Objective: Patient is well-developed, elderly, frail in no acute distress. A&Ox3. Appears fatigued. Resting comfortably at home. Unlabored, regular breathing. Speech is clear and coherent with logical content.    Assessment and Plan: Bronchiectasis without complication ([place]) Respiratory status appears stable. No increased mucus production or chest congestion. Increased prednisone seems to have had minimal effect on her fatigue/weakness. Instructed her to resume 10 mg daily and if she notices any  worsening symptoms, she can increase back to 20 mg. She will continue chronic macrolide therapy and mucociliary clearance, as needed for comfort.   Patient Instructions  Return to prednisone 10 mg daily; if you notice that cough or fatigue worsen, you can go back to 20 mg daily Continue azithromycin 250 mg daily  Continue your albuterol nebulizer 3 times a day on a schedule to help clear mucus and secretions. Continue your chest vest and your flutter valve as you have been using them. Continue protonix 40 mg daily  Continue supplemental oxygen 2 lpm for goal oxygen saturation >88-90% with activity and at  night.   Try decreasing amlodipine to every other day. If you notice your weakness and fatigue improves on days you do not take this, I would recommends stopping. Goal BP <140/90 and >100/60   Follow up as scheduled with [name] in 8-10 weeks. If symptoms do not improve or worsen, please contact office for sooner follow up or seek emergency care.    Age-related physical debility Declining status over the past few months. Hospice is involved. Does not appear to be related to acute lung problem/bronchiectatic exacerbation. She is still taking amlodipine daily, which has been questioned to cause hypotension contributing to her fatigue and weakness. She will trial change to every other day, as this is the most comforting option to her. She will keep track of how she is feeling on days that she does not take the medication and consider stopping.      I discussed the assessment and treatment plan with the patient. The patient was provided an opportunity to ask questions and all were answered. The patient agreed with the plan and demonstrated an understanding of the instructions.   The patient was advised to call back or seek an in-person evaluation if the symptoms worsen or if the condition fails to improve as anticipated.  I provided 32 minutes of non-face-to-face time during this  encounter.   Clayton Bibles, NP

## 2022-01-17 ENCOUNTER — Encounter: Payer: Self-pay | Admitting: Nurse Practitioner

## 2022-01-17 NOTE — Patient Instructions (Signed)
Return to prednisone 10 mg daily; if you notice that cough or fatigue worsen, you can go back to 20 mg daily Continue azithromycin 250 mg daily  Continue your albuterol nebulizer 3 times a day on a schedule to help clear mucus and secretions. Continue your chest vest and your flutter valve as you have been using them. Continue protonix 40 mg daily  Continue supplemental oxygen 2 lpm for goal oxygen saturation >88-90% with activity and at night.   Try decreasing amlodipine to every other day. If you notice your weakness and fatigue improves on days you do not take this, I would recommends stopping. Goal BP <140/90 and >100/60   Follow up as scheduled with [name] in 8-10 weeks. If symptoms do not improve or worsen, please contact office for sooner follow up or seek emergency care.

## 2022-01-17 NOTE — Assessment & Plan Note (Signed)
Declining status over the past few months. Hospice is involved. Does not appear to be related to acute lung problem/bronchiectatic exacerbation. She is still taking amlodipine daily, which has been questioned to cause hypotension contributing to her fatigue and weakness. She will trial change to every other day, as this is the most comforting option to her. She will keep track of how she is feeling on days that she does not take the medication and consider stopping.

## 2022-01-17 NOTE — Assessment & Plan Note (Addendum)
Respiratory status appears stable. No increased mucus production or chest congestion. Increased prednisone seems to have had minimal effect on her fatigue/weakness. Instructed her to resume 10 mg daily and if she notices any worsening symptoms, she can increase back to 20 mg. She will continue chronic macrolide therapy and mucociliary clearance, as needed for comfort.   Patient Instructions  Return to prednisone 10 mg daily; if you notice that cough or fatigue worsen, you can go back to 20 mg daily Continue azithromycin 250 mg daily  Continue your albuterol nebulizer 3 times a day on a schedule to help clear mucus and secretions. Continue your chest vest and your flutter valve as you have been using them. Continue protonix 40 mg daily  Continue supplemental oxygen 2 lpm for goal oxygen saturation >88-90% with activity and at night.   Try decreasing amlodipine to every other day. If you notice your weakness and fatigue improves on days you do not take this, I would recommends stopping. Goal BP <140/90 and >100/60   Follow up as scheduled with [name] in 8-10 weeks. If symptoms do not improve or worsen, please contact office for sooner follow up or seek emergency care.

## 2022-01-18 DIAGNOSIS — J45909 Unspecified asthma, uncomplicated: Secondary | ICD-10-CM | POA: Diagnosis not present

## 2022-01-18 DIAGNOSIS — I252 Old myocardial infarction: Secondary | ICD-10-CM | POA: Diagnosis not present

## 2022-01-18 DIAGNOSIS — I2510 Atherosclerotic heart disease of native coronary artery without angina pectoris: Secondary | ICD-10-CM | POA: Diagnosis not present

## 2022-01-18 DIAGNOSIS — R0600 Dyspnea, unspecified: Secondary | ICD-10-CM | POA: Diagnosis not present

## 2022-01-18 DIAGNOSIS — E43 Unspecified severe protein-calorie malnutrition: Secondary | ICD-10-CM | POA: Diagnosis not present

## 2022-01-18 DIAGNOSIS — J479 Bronchiectasis, uncomplicated: Secondary | ICD-10-CM | POA: Diagnosis not present

## 2022-01-22 DIAGNOSIS — J479 Bronchiectasis, uncomplicated: Secondary | ICD-10-CM | POA: Diagnosis not present

## 2022-01-22 DIAGNOSIS — I2510 Atherosclerotic heart disease of native coronary artery without angina pectoris: Secondary | ICD-10-CM | POA: Diagnosis not present

## 2022-01-22 DIAGNOSIS — R0600 Dyspnea, unspecified: Secondary | ICD-10-CM | POA: Diagnosis not present

## 2022-01-22 DIAGNOSIS — J45909 Unspecified asthma, uncomplicated: Secondary | ICD-10-CM | POA: Diagnosis not present

## 2022-01-22 DIAGNOSIS — E43 Unspecified severe protein-calorie malnutrition: Secondary | ICD-10-CM | POA: Diagnosis not present

## 2022-01-22 DIAGNOSIS — I252 Old myocardial infarction: Secondary | ICD-10-CM | POA: Diagnosis not present

## 2022-01-24 DIAGNOSIS — J479 Bronchiectasis, uncomplicated: Secondary | ICD-10-CM | POA: Diagnosis not present

## 2022-01-24 DIAGNOSIS — I252 Old myocardial infarction: Secondary | ICD-10-CM | POA: Diagnosis not present

## 2022-01-24 DIAGNOSIS — I2510 Atherosclerotic heart disease of native coronary artery without angina pectoris: Secondary | ICD-10-CM | POA: Diagnosis not present

## 2022-01-24 DIAGNOSIS — J45909 Unspecified asthma, uncomplicated: Secondary | ICD-10-CM | POA: Diagnosis not present

## 2022-01-24 DIAGNOSIS — E43 Unspecified severe protein-calorie malnutrition: Secondary | ICD-10-CM | POA: Diagnosis not present

## 2022-01-24 DIAGNOSIS — R0600 Dyspnea, unspecified: Secondary | ICD-10-CM | POA: Diagnosis not present

## 2022-01-29 DIAGNOSIS — R0600 Dyspnea, unspecified: Secondary | ICD-10-CM | POA: Diagnosis not present

## 2022-01-29 DIAGNOSIS — I252 Old myocardial infarction: Secondary | ICD-10-CM | POA: Diagnosis not present

## 2022-01-29 DIAGNOSIS — I2510 Atherosclerotic heart disease of native coronary artery without angina pectoris: Secondary | ICD-10-CM | POA: Diagnosis not present

## 2022-01-29 DIAGNOSIS — J479 Bronchiectasis, uncomplicated: Secondary | ICD-10-CM | POA: Diagnosis not present

## 2022-01-29 DIAGNOSIS — J45909 Unspecified asthma, uncomplicated: Secondary | ICD-10-CM | POA: Diagnosis not present

## 2022-01-29 DIAGNOSIS — E43 Unspecified severe protein-calorie malnutrition: Secondary | ICD-10-CM | POA: Diagnosis not present

## 2022-01-31 DIAGNOSIS — I252 Old myocardial infarction: Secondary | ICD-10-CM | POA: Diagnosis not present

## 2022-01-31 DIAGNOSIS — R0600 Dyspnea, unspecified: Secondary | ICD-10-CM | POA: Diagnosis not present

## 2022-01-31 DIAGNOSIS — E43 Unspecified severe protein-calorie malnutrition: Secondary | ICD-10-CM | POA: Diagnosis not present

## 2022-01-31 DIAGNOSIS — J479 Bronchiectasis, uncomplicated: Secondary | ICD-10-CM | POA: Diagnosis not present

## 2022-01-31 DIAGNOSIS — J45909 Unspecified asthma, uncomplicated: Secondary | ICD-10-CM | POA: Diagnosis not present

## 2022-01-31 DIAGNOSIS — I2510 Atherosclerotic heart disease of native coronary artery without angina pectoris: Secondary | ICD-10-CM | POA: Diagnosis not present

## 2022-02-04 DIAGNOSIS — R0600 Dyspnea, unspecified: Secondary | ICD-10-CM | POA: Diagnosis not present

## 2022-02-04 DIAGNOSIS — I252 Old myocardial infarction: Secondary | ICD-10-CM | POA: Diagnosis not present

## 2022-02-04 DIAGNOSIS — J45909 Unspecified asthma, uncomplicated: Secondary | ICD-10-CM | POA: Diagnosis not present

## 2022-02-04 DIAGNOSIS — I2510 Atherosclerotic heart disease of native coronary artery without angina pectoris: Secondary | ICD-10-CM | POA: Diagnosis not present

## 2022-02-04 DIAGNOSIS — J479 Bronchiectasis, uncomplicated: Secondary | ICD-10-CM | POA: Diagnosis not present

## 2022-02-04 DIAGNOSIS — E43 Unspecified severe protein-calorie malnutrition: Secondary | ICD-10-CM | POA: Diagnosis not present

## 2022-02-05 ENCOUNTER — Ambulatory Visit (INDEPENDENT_AMBULATORY_CARE_PROVIDER_SITE_OTHER): Payer: Medicare Other | Admitting: Podiatry

## 2022-02-05 VITALS — BP 160/104

## 2022-02-05 DIAGNOSIS — I739 Peripheral vascular disease, unspecified: Secondary | ICD-10-CM

## 2022-02-05 DIAGNOSIS — B351 Tinea unguium: Secondary | ICD-10-CM | POA: Diagnosis not present

## 2022-02-05 DIAGNOSIS — L84 Corns and callosities: Secondary | ICD-10-CM

## 2022-02-05 DIAGNOSIS — M79675 Pain in left toe(s): Secondary | ICD-10-CM

## 2022-02-05 DIAGNOSIS — M79674 Pain in right toe(s): Secondary | ICD-10-CM

## 2022-02-05 NOTE — Progress Notes (Signed)
Subjective:  Patient ID: Sarah [name], female    DOB: [birth year],  MRN: [id number]  Feather Berrie presents to clinic today for at risk foot care. Patient has h/o PAD and corn(s) left foot and painful thick toenails that are difficult to trim. Painful toenails interfere with ambulation. Aggravating factors include wearing enclosed shoe gear. Pain is relieved with periodic professional debridement. Painful corns are aggravated when weightbearing when wearing enclosed shoe gear. Pain is relieved with periodic professional debridement.  Chief Complaint  Patient presents with   Nail Problem    [facility] PCP-[facility] PCP VST-6 Months ago   New problem(s): None.   She is accompanied by her caregiver on today's visit. She states she has been very sick over the past several months, but she "bounced back".  PCP is [name], MD.  Allergies  Allergen Reactions   Codeine Anaphylaxis   Hydrocodone Nausea And Vomiting and Other (See Comments)    SYNCOPE AND BRADYCARDIA Other reaction(s): Unknown   Isosorbide Other (See Comments)    BRADYCARDIA   Isosorbide Nitrate Other (See Comments)    BRADYCARDIA   Oxycodone Palpitations    Rapid heart beat Other reaction(s): vomiting   Acetaminophen-Codeine Other (See Comments)    Other reaction(s): angioedema Other reaction(s): angioedema Other reaction(s): angioedema Other reaction(s): angioedema   Amoxicillin-Pot Clavulanate Diarrhea    Other reaction(s): watery incontinent diarrhea   Butalbital-Aspirin-Caffeine Swelling and Other (See Comments)    Facial swelling  Other reaction(s): angioedema   Clarithromycin Nausea And Vomiting    Has patient had a PCN reaction causing immediate rash, facial/tongue/throat swelling, SOB or lightheadedness with hypotension: Unknown Has patient had a PCN reaction causing severe rash involving mucus membranes or skin necrosis: Unknown Has patient had a PCN reaction that required hospitalization: Unknown Has  patient had a PCN reaction occurring within the last 10 years: Unknown If all of the above answers are "NO", then may proceed with Cephalosporin use.  Other reaction(s): vomiting   Levofloxacin Nausea And Vomiting and Other (See Comments)    SYNCOPE ALSO Other reaction(s): vomiting   Doxycycline Hyclate     Other reaction(s): Unknown   Hydrocodone-Acetaminophen    Isosorbide Dinitrate    Doxycycline Other (See Comments)    Sweats and aches    Review of Systems: Negative except as noted in the HPI.  Objective: No changes noted in today's physical examination. Vitals:   02/05/22 1638  BP: (!) 160/104  Patient denies any headache, chest pain or dizziness.   [name] is a pleasant 89 y.o. female frail, in NAD. AAO x 3. Constitutional [name] is a pleasant 89 y.o. Caucasian female, frail, in NAD. AAO x 3.   Vascular Capillary fill time to digits <3 seconds b/l lower extremities. Faintly palpable DP pulse(s) b/l lower extremities. Nonpalpable PT pulse(s) b/l lower extremities. Pedal hair absent. Lower extremity skin temperature gradient warm to cool. No pain with calf compression b/l. Trace edema noted b/l lower extremities. Venous stasis noted b/l LE. No cyanosis or clubbing noted.  Neurologic Normal speech. Oriented to person, place, and time. Pt has subjective symptoms of neuropathy. Protective sensation intact 5/5 intact bilaterally with 10g monofilament b/l.  Dermatologic Pedal skin is thin shiny, atrophic b/l lower extremities. No open wounds b/l lower extremities. No interdigital macerations b/l lower extremities.   Toenails 1-5 b/l elongated, discolored, dystrophic, thickened, crumbly with subungual debris and tenderness to dorsal palpation.   Hyperkeratotic lesion(s) bilateral 2nd toes at PIPJ.  No erythema, no edema, no drainage,  no fluctuance.   Orthopedic: Normal muscle strength 5/5 to all lower extremity muscle groups bilaterally. Severe hammertoe deformity noted  2-5 bilaterally. Using wheelchair on today's visit.   Radiographs: None  Assessment/Plan: 1. Pain due to onychomycosis of toenails of both feet   2. Corns   3. PAD (peripheral artery disease) ([place])     -Caregiver/provider present with patient on today's visit. -Examined patient. -Discussed elevated blood pressure reading with patient. Patient advised to discuss blood pressure with PCP. Patient/Family/Caregiver/POA related understanding. -She may continue wearing her house slippers which accommodate her pedal deformities and prevent rubbing of toes. -Family member/caregiver/POA instructed to continue pressure precautions floating heels when patient is in bed. -Continue supportive shoe gear daily. -Toenails 1-5 b/l were debrided in length and girth with sterile nail nippers and dremel without iatrogenic bleeding.  -Corn(s) L 4th toe pared utilizing sharp debridement with sterile blade without complication or incident. Total number debrided=1. -Patient/POA to call should there be question/concern in the interim.   Return in about 3 months (around 05/07/2022).  Marzetta Board, DPM

## 2022-02-06 DIAGNOSIS — R0600 Dyspnea, unspecified: Secondary | ICD-10-CM | POA: Diagnosis not present

## 2022-02-06 DIAGNOSIS — I2510 Atherosclerotic heart disease of native coronary artery without angina pectoris: Secondary | ICD-10-CM | POA: Diagnosis not present

## 2022-02-06 DIAGNOSIS — E43 Unspecified severe protein-calorie malnutrition: Secondary | ICD-10-CM | POA: Diagnosis not present

## 2022-02-06 DIAGNOSIS — J479 Bronchiectasis, uncomplicated: Secondary | ICD-10-CM | POA: Diagnosis not present

## 2022-02-06 DIAGNOSIS — J45909 Unspecified asthma, uncomplicated: Secondary | ICD-10-CM | POA: Diagnosis not present

## 2022-02-06 DIAGNOSIS — I252 Old myocardial infarction: Secondary | ICD-10-CM | POA: Diagnosis not present

## 2022-02-08 DIAGNOSIS — I2510 Atherosclerotic heart disease of native coronary artery without angina pectoris: Secondary | ICD-10-CM | POA: Diagnosis not present

## 2022-02-08 DIAGNOSIS — J45909 Unspecified asthma, uncomplicated: Secondary | ICD-10-CM | POA: Diagnosis not present

## 2022-02-08 DIAGNOSIS — E43 Unspecified severe protein-calorie malnutrition: Secondary | ICD-10-CM | POA: Diagnosis not present

## 2022-02-08 DIAGNOSIS — I252 Old myocardial infarction: Secondary | ICD-10-CM | POA: Diagnosis not present

## 2022-02-08 DIAGNOSIS — J479 Bronchiectasis, uncomplicated: Secondary | ICD-10-CM | POA: Diagnosis not present

## 2022-02-08 DIAGNOSIS — R0600 Dyspnea, unspecified: Secondary | ICD-10-CM | POA: Diagnosis not present

## 2022-02-09 ENCOUNTER — Encounter: Payer: Self-pay | Admitting: Podiatry

## 2022-02-11 DIAGNOSIS — J45909 Unspecified asthma, uncomplicated: Secondary | ICD-10-CM | POA: Diagnosis not present

## 2022-02-11 DIAGNOSIS — E43 Unspecified severe protein-calorie malnutrition: Secondary | ICD-10-CM | POA: Diagnosis not present

## 2022-02-11 DIAGNOSIS — I252 Old myocardial infarction: Secondary | ICD-10-CM | POA: Diagnosis not present

## 2022-02-11 DIAGNOSIS — R0600 Dyspnea, unspecified: Secondary | ICD-10-CM | POA: Diagnosis not present

## 2022-02-11 DIAGNOSIS — J479 Bronchiectasis, uncomplicated: Secondary | ICD-10-CM | POA: Diagnosis not present

## 2022-02-11 DIAGNOSIS — I2510 Atherosclerotic heart disease of native coronary artery without angina pectoris: Secondary | ICD-10-CM | POA: Diagnosis not present

## 2022-02-13 DIAGNOSIS — I2510 Atherosclerotic heart disease of native coronary artery without angina pectoris: Secondary | ICD-10-CM | POA: Diagnosis not present

## 2022-02-13 DIAGNOSIS — J45909 Unspecified asthma, uncomplicated: Secondary | ICD-10-CM | POA: Diagnosis not present

## 2022-02-13 DIAGNOSIS — J479 Bronchiectasis, uncomplicated: Secondary | ICD-10-CM | POA: Diagnosis not present

## 2022-02-13 DIAGNOSIS — R0600 Dyspnea, unspecified: Secondary | ICD-10-CM | POA: Diagnosis not present

## 2022-02-13 DIAGNOSIS — E43 Unspecified severe protein-calorie malnutrition: Secondary | ICD-10-CM | POA: Diagnosis not present

## 2022-02-13 DIAGNOSIS — I252 Old myocardial infarction: Secondary | ICD-10-CM | POA: Diagnosis not present

## 2022-02-14 DIAGNOSIS — H409 Unspecified glaucoma: Secondary | ICD-10-CM | POA: Diagnosis not present

## 2022-02-14 DIAGNOSIS — E43 Unspecified severe protein-calorie malnutrition: Secondary | ICD-10-CM | POA: Diagnosis not present

## 2022-02-14 DIAGNOSIS — R32 Unspecified urinary incontinence: Secondary | ICD-10-CM | POA: Diagnosis not present

## 2022-02-14 DIAGNOSIS — J45909 Unspecified asthma, uncomplicated: Secondary | ICD-10-CM | POA: Diagnosis not present

## 2022-02-14 DIAGNOSIS — R1310 Dysphagia, unspecified: Secondary | ICD-10-CM | POA: Diagnosis not present

## 2022-02-14 DIAGNOSIS — E785 Hyperlipidemia, unspecified: Secondary | ICD-10-CM | POA: Diagnosis not present

## 2022-02-14 DIAGNOSIS — I509 Heart failure, unspecified: Secondary | ICD-10-CM | POA: Diagnosis not present

## 2022-02-14 DIAGNOSIS — Z8701 Personal history of pneumonia (recurrent): Secondary | ICD-10-CM | POA: Diagnosis not present

## 2022-02-14 DIAGNOSIS — E039 Hypothyroidism, unspecified: Secondary | ICD-10-CM | POA: Diagnosis not present

## 2022-02-14 DIAGNOSIS — I252 Old myocardial infarction: Secondary | ICD-10-CM | POA: Diagnosis not present

## 2022-02-14 DIAGNOSIS — G629 Polyneuropathy, unspecified: Secondary | ICD-10-CM | POA: Diagnosis not present

## 2022-02-14 DIAGNOSIS — N183 Chronic kidney disease, stage 3 unspecified: Secondary | ICD-10-CM | POA: Diagnosis not present

## 2022-02-14 DIAGNOSIS — I2510 Atherosclerotic heart disease of native coronary artery without angina pectoris: Secondary | ICD-10-CM | POA: Diagnosis not present

## 2022-02-14 DIAGNOSIS — K219 Gastro-esophageal reflux disease without esophagitis: Secondary | ICD-10-CM | POA: Diagnosis not present

## 2022-02-14 DIAGNOSIS — Z9981 Dependence on supplemental oxygen: Secondary | ICD-10-CM | POA: Diagnosis not present

## 2022-02-14 DIAGNOSIS — D631 Anemia in chronic kidney disease: Secondary | ICD-10-CM | POA: Diagnosis not present

## 2022-02-14 DIAGNOSIS — R159 Full incontinence of feces: Secondary | ICD-10-CM | POA: Diagnosis not present

## 2022-02-14 DIAGNOSIS — Z681 Body mass index (BMI) 19 or less, adult: Secondary | ICD-10-CM | POA: Diagnosis not present

## 2022-02-14 DIAGNOSIS — Z8673 Personal history of transient ischemic attack (TIA), and cerebral infarction without residual deficits: Secondary | ICD-10-CM | POA: Diagnosis not present

## 2022-02-14 DIAGNOSIS — R296 Repeated falls: Secondary | ICD-10-CM | POA: Diagnosis not present

## 2022-02-14 DIAGNOSIS — F329 Major depressive disorder, single episode, unspecified: Secondary | ICD-10-CM | POA: Diagnosis not present

## 2022-02-14 DIAGNOSIS — I679 Cerebrovascular disease, unspecified: Secondary | ICD-10-CM | POA: Diagnosis not present

## 2022-02-14 DIAGNOSIS — R0600 Dyspnea, unspecified: Secondary | ICD-10-CM | POA: Diagnosis not present

## 2022-02-14 DIAGNOSIS — J479 Bronchiectasis, uncomplicated: Secondary | ICD-10-CM | POA: Diagnosis not present

## 2022-02-14 DIAGNOSIS — I130 Hypertensive heart and chronic kidney disease with heart failure and stage 1 through stage 4 chronic kidney disease, or unspecified chronic kidney disease: Secondary | ICD-10-CM | POA: Diagnosis not present

## 2022-02-15 DIAGNOSIS — I252 Old myocardial infarction: Secondary | ICD-10-CM | POA: Diagnosis not present

## 2022-02-15 DIAGNOSIS — R0600 Dyspnea, unspecified: Secondary | ICD-10-CM | POA: Diagnosis not present

## 2022-02-15 DIAGNOSIS — E43 Unspecified severe protein-calorie malnutrition: Secondary | ICD-10-CM | POA: Diagnosis not present

## 2022-02-15 DIAGNOSIS — J479 Bronchiectasis, uncomplicated: Secondary | ICD-10-CM | POA: Diagnosis not present

## 2022-02-15 DIAGNOSIS — I2510 Atherosclerotic heart disease of native coronary artery without angina pectoris: Secondary | ICD-10-CM | POA: Diagnosis not present

## 2022-02-15 DIAGNOSIS — J45909 Unspecified asthma, uncomplicated: Secondary | ICD-10-CM | POA: Diagnosis not present

## 2022-02-18 DIAGNOSIS — I252 Old myocardial infarction: Secondary | ICD-10-CM | POA: Diagnosis not present

## 2022-02-18 DIAGNOSIS — I2510 Atherosclerotic heart disease of native coronary artery without angina pectoris: Secondary | ICD-10-CM | POA: Diagnosis not present

## 2022-02-18 DIAGNOSIS — E43 Unspecified severe protein-calorie malnutrition: Secondary | ICD-10-CM | POA: Diagnosis not present

## 2022-02-18 DIAGNOSIS — J479 Bronchiectasis, uncomplicated: Secondary | ICD-10-CM | POA: Diagnosis not present

## 2022-02-18 DIAGNOSIS — J45909 Unspecified asthma, uncomplicated: Secondary | ICD-10-CM | POA: Diagnosis not present

## 2022-02-18 DIAGNOSIS — R0600 Dyspnea, unspecified: Secondary | ICD-10-CM | POA: Diagnosis not present

## 2022-02-21 DIAGNOSIS — E43 Unspecified severe protein-calorie malnutrition: Secondary | ICD-10-CM | POA: Diagnosis not present

## 2022-02-21 DIAGNOSIS — I2510 Atherosclerotic heart disease of native coronary artery without angina pectoris: Secondary | ICD-10-CM | POA: Diagnosis not present

## 2022-02-21 DIAGNOSIS — I252 Old myocardial infarction: Secondary | ICD-10-CM | POA: Diagnosis not present

## 2022-02-21 DIAGNOSIS — J479 Bronchiectasis, uncomplicated: Secondary | ICD-10-CM | POA: Diagnosis not present

## 2022-02-21 DIAGNOSIS — J45909 Unspecified asthma, uncomplicated: Secondary | ICD-10-CM | POA: Diagnosis not present

## 2022-02-21 DIAGNOSIS — R0600 Dyspnea, unspecified: Secondary | ICD-10-CM | POA: Diagnosis not present

## 2022-02-25 DIAGNOSIS — I2510 Atherosclerotic heart disease of native coronary artery without angina pectoris: Secondary | ICD-10-CM | POA: Diagnosis not present

## 2022-02-25 DIAGNOSIS — J479 Bronchiectasis, uncomplicated: Secondary | ICD-10-CM | POA: Diagnosis not present

## 2022-02-25 DIAGNOSIS — R0600 Dyspnea, unspecified: Secondary | ICD-10-CM | POA: Diagnosis not present

## 2022-02-25 DIAGNOSIS — J45909 Unspecified asthma, uncomplicated: Secondary | ICD-10-CM | POA: Diagnosis not present

## 2022-02-25 DIAGNOSIS — E43 Unspecified severe protein-calorie malnutrition: Secondary | ICD-10-CM | POA: Diagnosis not present

## 2022-02-25 DIAGNOSIS — I252 Old myocardial infarction: Secondary | ICD-10-CM | POA: Diagnosis not present

## 2022-02-28 DIAGNOSIS — I252 Old myocardial infarction: Secondary | ICD-10-CM | POA: Diagnosis not present

## 2022-02-28 DIAGNOSIS — J45909 Unspecified asthma, uncomplicated: Secondary | ICD-10-CM | POA: Diagnosis not present

## 2022-02-28 DIAGNOSIS — R0600 Dyspnea, unspecified: Secondary | ICD-10-CM | POA: Diagnosis not present

## 2022-02-28 DIAGNOSIS — J479 Bronchiectasis, uncomplicated: Secondary | ICD-10-CM | POA: Diagnosis not present

## 2022-02-28 DIAGNOSIS — I2510 Atherosclerotic heart disease of native coronary artery without angina pectoris: Secondary | ICD-10-CM | POA: Diagnosis not present

## 2022-02-28 DIAGNOSIS — E43 Unspecified severe protein-calorie malnutrition: Secondary | ICD-10-CM | POA: Diagnosis not present

## 2022-03-01 ENCOUNTER — Telehealth (INDEPENDENT_AMBULATORY_CARE_PROVIDER_SITE_OTHER): Payer: Medicare Other | Admitting: Nurse Practitioner

## 2022-03-01 ENCOUNTER — Encounter: Payer: Self-pay | Admitting: Nurse Practitioner

## 2022-03-01 DIAGNOSIS — R1310 Dysphagia, unspecified: Secondary | ICD-10-CM | POA: Diagnosis not present

## 2022-03-01 DIAGNOSIS — R54 Age-related physical debility: Secondary | ICD-10-CM | POA: Diagnosis not present

## 2022-03-01 DIAGNOSIS — J9611 Chronic respiratory failure with hypoxia: Secondary | ICD-10-CM | POA: Diagnosis not present

## 2022-03-01 DIAGNOSIS — J300 Vasomotor rhinitis: Secondary | ICD-10-CM | POA: Insufficient documentation

## 2022-03-01 DIAGNOSIS — J479 Bronchiectasis, uncomplicated: Secondary | ICD-10-CM | POA: Diagnosis not present

## 2022-03-01 MED ORDER — IPRATROPIUM BROMIDE 0.03 % NA SOLN: 2.0000 | Freq: Three times a day (TID) | NASAL | 5 refills | Status: DC

## 2022-03-01 NOTE — Progress Notes (Signed)
Patient ID: Sarah [name], female     DOB: 1921/05/30, 89 y.o.      MRN: [id number]  Chief Complaint  Patient presents with   Follow-up    Loss of appetite, pt has had mucus and a cough, (clear mucus)    Virtual Visit via Video Note  I connected with Sarah [name] on 03/01/22 at  3:00 PM EST by a video enabled telemedicine application and verified that I am speaking with the correct person using two identifiers.  Location: Patient: Home Provider: Office   I discussed the limitations of evaluation and management by telemedicine and the availability of in person appointments. The patient expressed understanding and agreed to proceed.  History of Present Illness: 89 year old old female, never smoker followed for asthma, bronchiectasis with pseudomonal colonization, allergic rhinitis, chronic cough. She is a patient of Dr. Agustina Caroli and last seen via virtual visit 01/16/2022 by [facility] NP. Past medical history significant for HTN, NSTEMI, stroke, CAD, chronic diastolic CHF, hypothyroid, arthritis, adrenal insuffiencey, CKD stage III, GERD, depression/anxiety, HLD, IDA, macular degeneration, prediabetes.  TEST/EVENTS:  02/24/2012 CT chest without contrast: Mediastinal lymph nodes measure up to 12 mm in the precarinal station, as before.  Moderate hiatal hernia.  Trace amount of loculated pleural fluid in posterior medial inferior left hemithorax.  Mild biapical pleural-parenchymal scarring.  Interval progression of bibasilar predominant peribronchovascular nodularity, areas of airspace consolidation and scattered bronchiectasis.  Findings suggestive of MAC. 07/02/2019 CTA chest: Scattered low-attenuation mediastinal and hilar lymph nodes, many subcentimeter.  Largest node is 13 mm precarinal node, stable.  Airways diffusely thickened and many towards the bases appear fluid-filled with some chronic architectural distortion and scarring in LLL, lingula and RML.  Increasing consolidative opacities present  in right lung base.  Additional patchy consolidation with tree-in-bud nodularity is present in the right upper lobe as well.  Some mosaic attenuation may reflect further small airways disease or air trapping.  No PE.  Large hiatal hernia. 11/15/2018 CXR 1 view: Unchanged chronic increased opacity at the left lung base, likely atelectasis.  No new acute abnormality. 01/04/2021 CXR 2 View: large hiatal hernia. Lungs clear without acute process.  05/21/2021 CXR: stable chronic lung disease with diffuse interstitial prominence, btx, and scattered parenchymal opacities. No new airspace disease.  09/20/2021: OV with Eutimio Gharibian,NP. She was seen 8/29 and treated for bronchiectatic flare with levaquin course and prednisone taper, which she has completed. She is clinically improving. Her daughter reports that she looks much better and closer to her baseline. She still has a productive cough which is less copious and no longer purulent. Encouraged her to continue mucociliary clearance therapies. Cough is strong. She will also continue albuterol nebs at least three times a day. Activity encouraged.    10/25/2021: OV with [name]. Mucus production has improved. However, she has begun to decline overall. Persistent fatigue, weakness, weight loss. Family has involved hospice. Encouraged her to focus on comfort and dignity, quality of life. Plan to continue current btx regimen but certainly reserve the right to narrow focus, concentrate on symptoms. BPs have been low. Plan to d/c amlodipine, continue carvedilol.  12/25/2021: Virtual visit with [name]. Still struggling with excessive fatigue and weakness. Minimal cough or sputum. Back on amlodipine? Question weather she is having relative hypotension when these episodes of low energy are occurring. Advised to keep track of BP when these episodes occur. BTX does not appear to be flaring. Will trial change to 20 mg of prednisone daily.  01/16/2022:  OV with Calloway Andrus NP for follow up via  virtual visit. Her daughter is with her and helps provide her history. She overall feels relatively similar compared to the last time she was seen by [name]. Her cough is stable with minimal mucus production. She is still having weakness and episodes of excessive fatigue. She's not sure if the increased dose of prednisone has made much of a difference. She is still taking her amlodipine daily. BP doesn't seem to drop when she gets tired, but she runs in the low 100's. It makes her very nervous to come off of this as she's worried she'll have a stroke. She did talk to her PCP about taking the amlodipine every other day. No fevers, chills, hemoptysis, dizziness. She is using her mucociliary clearance therapies, when needed but not consistently.  Plan to decrease back to 10 mg of prednisone daily.  She will monitor if she notices any changes with this.  She is also going to start using her amlodipine every other day and keep track of her BP doing so.  03/01/2022: Today-follow-up Patient presents today for follow-up via virtual visit with her daughter.  She tells me that her fatigue symptoms have improved some since she was here last.  She still occasionally gets some bouts of fatigue and weakness throughout the day.  She has been working with the hospice team on managing this.  She has been trying to just listen to her body and rest when she becomes tired.  Her breathing feels stable.  She is not having any increased productive cough with purulent sputum.  She denies any wheezing or chest congestion.  She has been having some trouble with eating over the past few months.  Feels like she does not really have much of an appetite and has some difficulties swallowing at times.  She also feels like she gets a lot of mucus in the back of her throat when she starts eating.  She does say that her nose starts running a little bit more when she eats as well.  She feels like she has to cough or clear her throat to get the  mucus up.  Denies ever having a cough after swallowing food.  She has not had any changes in bowel habits or abdominal pain.  No nausea, vomiting, fevers or chills.  Allergies  Allergen Reactions   Codeine Anaphylaxis   Hydrocodone Nausea And Vomiting and Other (See Comments)    SYNCOPE AND BRADYCARDIA Other reaction(s): Unknown   Isosorbide Other (See Comments)    BRADYCARDIA   Isosorbide Nitrate Other (See Comments)    BRADYCARDIA   Oxycodone Palpitations    Rapid heart beat Other reaction(s): vomiting   Acetaminophen-Codeine Other (See Comments)    Other reaction(s): angioedema Other reaction(s): angioedema Other reaction(s): angioedema Other reaction(s): angioedema   Amoxicillin-Pot Clavulanate Diarrhea    Other reaction(s): watery incontinent diarrhea   Butalbital-Aspirin-Caffeine Swelling and Other (See Comments)    Facial swelling  Other reaction(s): angioedema   Clarithromycin Nausea And Vomiting    Has patient had a PCN reaction causing immediate rash, facial/tongue/throat swelling, SOB or lightheadedness with hypotension: Unknown Has patient had a PCN reaction causing severe rash involving mucus membranes or skin necrosis: Unknown Has patient had a PCN reaction that required hospitalization: Unknown Has patient had a PCN reaction occurring within the last 10 years: Unknown If all of the above answers are "NO", then may proceed with Cephalosporin use.  Other reaction(s): vomiting   Levofloxacin Nausea  And Vomiting and Other (See Comments)    SYNCOPE ALSO Other reaction(s): vomiting   Doxycycline Hyclate     Other reaction(s): Unknown   Hydrocodone-Acetaminophen    Isosorbide Dinitrate    Doxycycline Other (See Comments)    Sweats and aches   Immunization History  Administered Date(s) Administered   Fluad Quad(high Dose 65+) 10/08/2018, 10/11/2019, 10/13/2020   Influenza Split 10/15/2011, 10/14/2012, 09/15/2013, 10/29/2016, 10/08/2018, 10/11/2019   Influenza  Whole 10/17/2008, 11/14/2009, 11/13/2010   Influenza, High Dose Seasonal PF 11/21/2015, 10/29/2016, 09/22/2017   Influenza,inj,Quad PF,6+ Mos 09/17/2013, 10/07/2014   Influenza,inj,quad, With Preservative 11/08/2014   Influenza-Unspecified 10/13/2020, 10/10/2021   Moderna Covid-19 Vaccine Bivalent Booster 80yr & up 09/21/2020   Moderna Sars-Covid-2 Vaccination 02/04/2019, 03/04/2019, 10/15/2019, 06/07/2020   PPD Test 03/28/2014   Pneumococcal Conjugate-13 04/07/2014   Pneumococcal Polysaccharide-23 02/19/1999   Tdap 09/21/2012   Past Medical History:  Diagnosis Date   Allergic rhinitis    Anxiety    Anxiety attacks   Arthritis    "a little; in my back and hands" (04/15/2012)   Asthma    Bronchiectasis    with history of Legionaires with chronic rales/rhonchi   Chest pain at rest    Daily headache    "over the last week" 04/15/2012    Depression    Diastolic dysfunction    a. per echo 08/2010 with normal LV function;  b. 06/2011 Echo: EF 65-70%   GERD (gastroesophageal reflux disease)    "just recently" (04/15/2012)   H/O hiatal hernia    H/O Legionnaire's disease 11/1976   History of blood transfusion 1972   "w/hysterectomy" (04/15/2012)   Hypercholesteremia    Hypertension    Negative renal duplex in December of 2012   Hypothyroidism    Idiopathic peripheral neuropathy    MAC (mycobacterium avium-intracellulare complex)    Migraines    "outgrew them" (04/15/2012)   NSTEMI (non-ST elevated myocardial infarction) ([place]    a. Normal coronaries per cath August 2012 and negative CT angio for PE;  b. 06/2011 Repeat admission w/ chest pain and elevated troponin's, CTA Chest w/o PE   Osteopenia 02/2011   t score - 2.1   Pneumonia    "recurrent" (04/15/2012)   Pollen allergies    Shortness of breath    "sometimes just lying down" (04/15/2012)   Stroke ([place] 06/2014   cerebellum    Tobacco History: Social History   Tobacco Use  Smoking Status Never  Smokeless Tobacco Never    Counseling given: Not Answered   Outpatient Medications Prior to Visit  Medication Sig Dispense Refill   acetaminophen (TYLENOL) 650 MG CR tablet      albuterol (PROVENTIL) (2.5 MG/3ML) 0.083% nebulizer solution INHALE 3 ML BY NEBULIZATION EVERY 6 HOURS AS NEEDED FOR WHEEZING OR SHORTNESS OF BREATH (Patient taking differently: Taking three times a day) 225 mL 3   albuterol (VENTOLIN HFA) 108 (90 Base) MCG/ACT inhaler Inhale 2 puffs into the lungs every 6 (six) hours as needed for wheezing or shortness of breath. 8 g 6   ALPRAZolam (XANAX) 0.5 MG tablet Take 1 tablet (0.5 mg total) by mouth as directed. Take 1 tablet (0.5 mg) BID at (8 am & 8 pm) & DAILY PRN FOR ANXIETY 10 tablet 0   amLODipine (NORVASC) 2.5 MG tablet Take 2.5 mg by mouth daily.     azithromycin (ZITHROMAX) 250 MG tablet TAKE 1 TABLET BY MOUTH EVERY DAY 90 tablet 1   benzonatate (TESSALON) 200 MG capsule Take 1  capsule (200 mg total) by mouth 3 (three) times daily as needed for cough. 30 capsule 1   Brinzolamide-Brimonidine (SIMBRINZA) 1-0.2 % SUSP      calcium carbonate (TUMS - DOSED IN MG ELEMENTAL CALCIUM) 500 MG chewable tablet 1 tablet as needed for indigestion or heartburn     carvedilol (COREG) 6.25 MG tablet      clopidogrel (PLAVIX) 75 MG tablet      DULoxetine (CYMBALTA) 20 MG capsule duloxetine 20 mg capsule,delayed release  TAKE 1 CAPSULE BY MOUTH THREE TIMES DAILY     DULoxetine (CYMBALTA) 30 MG capsule TAKE 1 CAPSULE     Elastic Bandages & Supports (JOBST ACTIVE 20-30MMHG MEDIUM) MISC See admin instructions.     escitalopram (LEXAPRO) 5 MG tablet Take 1 tablet by mouth daily.     Iron-FA-B Cmp-C-Biot-Probiotic (FUSION PLUS) CAPS      latanoprost (XALATAN) 0.005 % ophthalmic solution      levothyroxine (SYNTHROID) 50 MCG tablet 1 tablet in the morning on an empty stomach     mupirocin ointment (BACTROBAN) 2 % Apply to affected digits once daily. (Patient taking differently: Apply 1 application  topically daily  as needed (dry skin).) 30 g 1   nitrofurantoin (MACRODANTIN) 100 MG capsule 1 capsule at bedtime with food or milk     NONFORMULARY OR COMPOUNDED ITEM [facility]:  Neuropathy Cream #11 - Bupivacaine 1%, Doxepin 3%, Gabapentin 6%, Pentoxifylline 3%, Topiramate 1%. Apply 1-2 grams to affected areas 3-4 times a day. (Patient taking differently: [facility]:  Neuropathy Cream #11 - Bupivacaine 1%, Doxepin 3%, Gabapentin 6%, Pentoxifylline 3%, Topiramate 1%. Apply 1-2 grams to affected areas 3-4 times a day PRN for Dry Skin) 100 each 5   pantoprazole (PROTONIX) 40 MG tablet Take 1 tablet by mouth 2 (two) times daily.     polyethylene glycol (MIRALAX / GLYCOLAX) 17 g packet Take 17 g by mouth daily as needed for mild constipation. 14 each 0   predniSONE (DELTASONE) 10 MG tablet Take 1 tablet (10 mg total) by mouth daily with breakfast. (Patient taking differently: Take 20 mg by mouth daily with breakfast.) 21 tablet 0   ROCKLATAN 0.02-0.005 % SOLN Place 1 drop into both eyes at bedtime.     senna (SENOKOT) 8.6 MG TABS tablet Take 1 tablet (8.6 mg total) by mouth daily. 120 tablet 0   Simethicone (GAS-X PO)      guaiFENesin (MUCINEX) 600 MG 12 hr tablet Take 2 tablets (1,200 mg total) by mouth 2 (two) times daily. (Patient not taking: Reported on 01/16/2022) 60 tablet 3   traMADol (ULTRAM) 50 MG tablet Take 1 tablet (50 mg total) by mouth every 6 (six) hours as needed for moderate pain. (Patient not taking: Reported on 01/16/2022) 30 tablet 0   No facility-administered medications prior to visit.     Review of Systems:   Constitutional: No weight loss or gain, night sweats, fevers, chills. +fatigue, lassitude (improved) HEENT: No headaches, tooth/dental problems, or sore throat. No sneezing, itching, ear ache, nasal congestion. +post nasal drip, globus sensation with eating, difficulty swallowing CV:  No chest pain, orthopnea, PND, swelling in lower extremities, anasarca, dizziness,  palpitations, syncope Resp: +cough, minimal production. No increased shortness of breath with exertion or at rest. No productive or non-productive. No hemoptysis. No wheezing.  No chest wall deformity GI: +decreased appetite. No heartburn, indigestion, abdominal pain, nausea, vomiting Skin: No rash, lesions, ulcerations MSK:  No joint pain or swelling.   Neuro: No dizziness or  lightheadedness.  Psych: Mood stable.   Observations/Objective: Patient is well-developed, elderly, frail in no acute distress. A&Ox3. Appears fatigued. Resting comfortably at home. Unlabored, regular breathing. Speech is clear and coherent with logical content.    Assessment and Plan: Bronchiectasis without complication ([place]) Compensated on current regimen.  She does not to be having any acute respiratory symptoms.  She will continue with mucociliary clearance therapies, daily low-dose prednisone, and chronic macrolide therapy.  Will continue to monitor her.  Patient Instructions  Continue prednisone 10 mg daily in AM with food  Continue azithromycin 250 mg daily  Continue your albuterol nebulizer 3 times a day on a schedule to help clear mucus and secretions. Continue your chest vest and your flutter valve as you have been using them. Continue protonix 40 mg daily  Continue supplemental oxygen 2 lpm for goal oxygen saturation >88-90% with activity and at night.   Trial ipratropium nasal spray 2 sprays each nostril prior to meals to see if this helps with your mucus during meals. If no improvement after a few days of use, you can stop   Follow up with [name] in 3 months. If symptoms do not improve or worsen, please contact office for sooner follow up or seek emergency care.    Chronic hypoxic respiratory failure (HCC) Stable without increased O2 requirement.  Continue 2 L/min for goal greater than 88 to 90%.  Vasomotor rhinitis The globus sensation with eating seems to be more so related to postnasal  drainage and vasomotor rhinitis.  Will add on ipratropium nasal sprays prior to meals to see if she has any perceived benefit.  Given her age and frailty, would not be appropriate to put her through endoscopy procedure for further evaluation of her dysphagia.  She is already on PPI therapy.  Advised her to take small bites during meals, chew food thoroughly and make sure that she is sitting upright.  Encouraged her to work with hospice team regarding her appetite.  Dysphagia See above.  Has been evaluated by GI in the past.  Age-related physical debility Declining status over the past few months. Hospice is involved. Does not appear to be related to acute lung problem/bronchiectatic exacerbation.  We have tried her on increased dose of steroids without much benefit.  We did adjust her amlodipine every other day at her last visit.  This has seemed to help with her fatigue symptoms some.  Encouraged her to continue monitoring her blood pressure at home.      I discussed the assessment and treatment plan with the patient. The patient was provided an opportunity to ask questions and all were answered. The patient agreed with the plan and demonstrated an understanding of the instructions.   The patient was advised to call back or seek an in-person evaluation if the symptoms worsen or if the condition fails to improve as anticipated.  I provided 32 minutes of non-face-to-face time during this encounter.   Clayton Bibles, NP

## 2022-03-01 NOTE — Assessment & Plan Note (Signed)
The globus sensation with eating seems to be more so related to postnasal drainage and vasomotor rhinitis.  Will add on ipratropium nasal sprays prior to meals to see if she has any perceived benefit.  Given her age and frailty, would not be appropriate to put her through endoscopy procedure for further evaluation of her dysphagia.  She is already on PPI therapy.  Advised her to take small bites during meals, chew food thoroughly and make sure that she is sitting upright.  Encouraged her to work with hospice team regarding her appetite.

## 2022-03-01 NOTE — Assessment & Plan Note (Signed)
Stable without increased O2 requirement.  Continue 2 L/min for goal greater than 88 to 90%.

## 2022-03-01 NOTE — Assessment & Plan Note (Signed)
Declining status over the past few months. Hospice is involved. Does not appear to be related to acute lung problem/bronchiectatic exacerbation.  We have tried her on increased dose of steroids without much benefit.  We did adjust her amlodipine every other day at her last visit.  This has seemed to help with her fatigue symptoms some.  Encouraged her to continue monitoring her blood pressure at home.

## 2022-03-01 NOTE — Assessment & Plan Note (Signed)
See above.  Has been evaluated by GI in the past.

## 2022-03-01 NOTE — Patient Instructions (Addendum)
Continue prednisone 10 mg daily in AM with food  Continue azithromycin 250 mg daily  Continue your albuterol nebulizer 3 times a day on a schedule to help clear mucus and secretions. Continue your chest vest and your flutter valve as you have been using them. Continue protonix 40 mg daily  Continue supplemental oxygen 2 lpm for goal oxygen saturation >88-90% with activity and at night.   Trial ipratropium nasal spray 2 sprays each nostril prior to meals to see if this helps with your mucus during meals. If no improvement after a few days of use, you can stop   Follow up with [name] in 3 months. If symptoms do not improve or worsen, please contact office for sooner follow up or seek emergency care.

## 2022-03-01 NOTE — Assessment & Plan Note (Addendum)
Compensated on current regimen.  She does not to be having any acute respiratory symptoms.  She will continue with mucociliary clearance therapies, daily low-dose prednisone, and chronic macrolide therapy.  Will continue to monitor her.  Patient Instructions  Continue prednisone 10 mg daily in AM with food  Continue azithromycin 250 mg daily  Continue your albuterol nebulizer 3 times a day on a schedule to help clear mucus and secretions. Continue your chest vest and your flutter valve as you have been using them. Continue protonix 40 mg daily  Continue supplemental oxygen 2 lpm for goal oxygen saturation >88-90% with activity and at night.   Trial ipratropium nasal spray 2 sprays each nostril prior to meals to see if this helps with your mucus during meals. If no improvement after a few days of use, you can stop   Follow up with [name] in 3 months. If symptoms do not improve or worsen, please contact office for sooner follow up or seek emergency care.

## 2022-03-04 DIAGNOSIS — E43 Unspecified severe protein-calorie malnutrition: Secondary | ICD-10-CM | POA: Diagnosis not present

## 2022-03-04 DIAGNOSIS — J45909 Unspecified asthma, uncomplicated: Secondary | ICD-10-CM | POA: Diagnosis not present

## 2022-03-04 DIAGNOSIS — R0600 Dyspnea, unspecified: Secondary | ICD-10-CM | POA: Diagnosis not present

## 2022-03-04 DIAGNOSIS — I2510 Atherosclerotic heart disease of native coronary artery without angina pectoris: Secondary | ICD-10-CM | POA: Diagnosis not present

## 2022-03-04 DIAGNOSIS — J479 Bronchiectasis, uncomplicated: Secondary | ICD-10-CM | POA: Diagnosis not present

## 2022-03-04 DIAGNOSIS — I252 Old myocardial infarction: Secondary | ICD-10-CM | POA: Diagnosis not present

## 2022-03-08 DIAGNOSIS — J479 Bronchiectasis, uncomplicated: Secondary | ICD-10-CM | POA: Diagnosis not present

## 2022-03-08 DIAGNOSIS — E43 Unspecified severe protein-calorie malnutrition: Secondary | ICD-10-CM | POA: Diagnosis not present

## 2022-03-08 DIAGNOSIS — I2510 Atherosclerotic heart disease of native coronary artery without angina pectoris: Secondary | ICD-10-CM | POA: Diagnosis not present

## 2022-03-08 DIAGNOSIS — I252 Old myocardial infarction: Secondary | ICD-10-CM | POA: Diagnosis not present

## 2022-03-08 DIAGNOSIS — J45909 Unspecified asthma, uncomplicated: Secondary | ICD-10-CM | POA: Diagnosis not present

## 2022-03-08 DIAGNOSIS — R0600 Dyspnea, unspecified: Secondary | ICD-10-CM | POA: Diagnosis not present

## 2022-03-11 DIAGNOSIS — R0600 Dyspnea, unspecified: Secondary | ICD-10-CM | POA: Diagnosis not present

## 2022-03-11 DIAGNOSIS — I2510 Atherosclerotic heart disease of native coronary artery without angina pectoris: Secondary | ICD-10-CM | POA: Diagnosis not present

## 2022-03-11 DIAGNOSIS — E43 Unspecified severe protein-calorie malnutrition: Secondary | ICD-10-CM | POA: Diagnosis not present

## 2022-03-11 DIAGNOSIS — J479 Bronchiectasis, uncomplicated: Secondary | ICD-10-CM | POA: Diagnosis not present

## 2022-03-11 DIAGNOSIS — J45909 Unspecified asthma, uncomplicated: Secondary | ICD-10-CM | POA: Diagnosis not present

## 2022-03-11 DIAGNOSIS — I252 Old myocardial infarction: Secondary | ICD-10-CM | POA: Diagnosis not present

## 2022-03-14 DIAGNOSIS — J479 Bronchiectasis, uncomplicated: Secondary | ICD-10-CM | POA: Diagnosis not present

## 2022-03-14 DIAGNOSIS — J45909 Unspecified asthma, uncomplicated: Secondary | ICD-10-CM | POA: Diagnosis not present

## 2022-03-14 DIAGNOSIS — I252 Old myocardial infarction: Secondary | ICD-10-CM | POA: Diagnosis not present

## 2022-03-14 DIAGNOSIS — E43 Unspecified severe protein-calorie malnutrition: Secondary | ICD-10-CM | POA: Diagnosis not present

## 2022-03-14 DIAGNOSIS — R0600 Dyspnea, unspecified: Secondary | ICD-10-CM | POA: Diagnosis not present

## 2022-03-14 DIAGNOSIS — I2510 Atherosclerotic heart disease of native coronary artery without angina pectoris: Secondary | ICD-10-CM | POA: Diagnosis not present

## 2022-03-15 DIAGNOSIS — E785 Hyperlipidemia, unspecified: Secondary | ICD-10-CM | POA: Diagnosis not present

## 2022-03-15 DIAGNOSIS — J45909 Unspecified asthma, uncomplicated: Secondary | ICD-10-CM | POA: Diagnosis not present

## 2022-03-15 DIAGNOSIS — D631 Anemia in chronic kidney disease: Secondary | ICD-10-CM | POA: Diagnosis not present

## 2022-03-15 DIAGNOSIS — E43 Unspecified severe protein-calorie malnutrition: Secondary | ICD-10-CM | POA: Diagnosis not present

## 2022-03-15 DIAGNOSIS — I509 Heart failure, unspecified: Secondary | ICD-10-CM | POA: Diagnosis not present

## 2022-03-15 DIAGNOSIS — N183 Chronic kidney disease, stage 3 unspecified: Secondary | ICD-10-CM | POA: Diagnosis not present

## 2022-03-15 DIAGNOSIS — J479 Bronchiectasis, uncomplicated: Secondary | ICD-10-CM | POA: Diagnosis not present

## 2022-03-15 DIAGNOSIS — K219 Gastro-esophageal reflux disease without esophagitis: Secondary | ICD-10-CM | POA: Diagnosis not present

## 2022-03-15 DIAGNOSIS — R159 Full incontinence of feces: Secondary | ICD-10-CM | POA: Diagnosis not present

## 2022-03-15 DIAGNOSIS — I2510 Atherosclerotic heart disease of native coronary artery without angina pectoris: Secondary | ICD-10-CM | POA: Diagnosis not present

## 2022-03-15 DIAGNOSIS — R1310 Dysphagia, unspecified: Secondary | ICD-10-CM | POA: Diagnosis not present

## 2022-03-15 DIAGNOSIS — F329 Major depressive disorder, single episode, unspecified: Secondary | ICD-10-CM | POA: Diagnosis not present

## 2022-03-15 DIAGNOSIS — Z681 Body mass index (BMI) 19 or less, adult: Secondary | ICD-10-CM | POA: Diagnosis not present

## 2022-03-15 DIAGNOSIS — R32 Unspecified urinary incontinence: Secondary | ICD-10-CM | POA: Diagnosis not present

## 2022-03-15 DIAGNOSIS — Z8701 Personal history of pneumonia (recurrent): Secondary | ICD-10-CM | POA: Diagnosis not present

## 2022-03-15 DIAGNOSIS — I679 Cerebrovascular disease, unspecified: Secondary | ICD-10-CM | POA: Diagnosis not present

## 2022-03-15 DIAGNOSIS — I130 Hypertensive heart and chronic kidney disease with heart failure and stage 1 through stage 4 chronic kidney disease, or unspecified chronic kidney disease: Secondary | ICD-10-CM | POA: Diagnosis not present

## 2022-03-15 DIAGNOSIS — I252 Old myocardial infarction: Secondary | ICD-10-CM | POA: Diagnosis not present

## 2022-03-15 DIAGNOSIS — Z9981 Dependence on supplemental oxygen: Secondary | ICD-10-CM | POA: Diagnosis not present

## 2022-03-15 DIAGNOSIS — Z8673 Personal history of transient ischemic attack (TIA), and cerebral infarction without residual deficits: Secondary | ICD-10-CM | POA: Diagnosis not present

## 2022-03-15 DIAGNOSIS — H409 Unspecified glaucoma: Secondary | ICD-10-CM | POA: Diagnosis not present

## 2022-03-15 DIAGNOSIS — R0600 Dyspnea, unspecified: Secondary | ICD-10-CM | POA: Diagnosis not present

## 2022-03-15 DIAGNOSIS — E039 Hypothyroidism, unspecified: Secondary | ICD-10-CM | POA: Diagnosis not present

## 2022-03-15 DIAGNOSIS — R296 Repeated falls: Secondary | ICD-10-CM | POA: Diagnosis not present

## 2022-03-15 DIAGNOSIS — G629 Polyneuropathy, unspecified: Secondary | ICD-10-CM | POA: Diagnosis not present

## 2022-03-19 DIAGNOSIS — J479 Bronchiectasis, uncomplicated: Secondary | ICD-10-CM | POA: Diagnosis not present

## 2022-03-19 DIAGNOSIS — R0600 Dyspnea, unspecified: Secondary | ICD-10-CM | POA: Diagnosis not present

## 2022-03-19 DIAGNOSIS — I252 Old myocardial infarction: Secondary | ICD-10-CM | POA: Diagnosis not present

## 2022-03-19 DIAGNOSIS — J45909 Unspecified asthma, uncomplicated: Secondary | ICD-10-CM | POA: Diagnosis not present

## 2022-03-19 DIAGNOSIS — I2510 Atherosclerotic heart disease of native coronary artery without angina pectoris: Secondary | ICD-10-CM | POA: Diagnosis not present

## 2022-03-19 DIAGNOSIS — E43 Unspecified severe protein-calorie malnutrition: Secondary | ICD-10-CM | POA: Diagnosis not present

## 2022-03-26 DIAGNOSIS — I2510 Atherosclerotic heart disease of native coronary artery without angina pectoris: Secondary | ICD-10-CM | POA: Diagnosis not present

## 2022-03-26 DIAGNOSIS — J479 Bronchiectasis, uncomplicated: Secondary | ICD-10-CM | POA: Diagnosis not present

## 2022-03-26 DIAGNOSIS — R0600 Dyspnea, unspecified: Secondary | ICD-10-CM | POA: Diagnosis not present

## 2022-03-26 DIAGNOSIS — J45909 Unspecified asthma, uncomplicated: Secondary | ICD-10-CM | POA: Diagnosis not present

## 2022-03-26 DIAGNOSIS — E43 Unspecified severe protein-calorie malnutrition: Secondary | ICD-10-CM | POA: Diagnosis not present

## 2022-03-26 DIAGNOSIS — I252 Old myocardial infarction: Secondary | ICD-10-CM | POA: Diagnosis not present

## 2022-03-29 DIAGNOSIS — E43 Unspecified severe protein-calorie malnutrition: Secondary | ICD-10-CM | POA: Diagnosis not present

## 2022-03-29 DIAGNOSIS — I2510 Atherosclerotic heart disease of native coronary artery without angina pectoris: Secondary | ICD-10-CM | POA: Diagnosis not present

## 2022-03-29 DIAGNOSIS — I252 Old myocardial infarction: Secondary | ICD-10-CM | POA: Diagnosis not present

## 2022-03-29 DIAGNOSIS — J479 Bronchiectasis, uncomplicated: Secondary | ICD-10-CM | POA: Diagnosis not present

## 2022-03-29 DIAGNOSIS — J45909 Unspecified asthma, uncomplicated: Secondary | ICD-10-CM | POA: Diagnosis not present

## 2022-03-29 DIAGNOSIS — R0600 Dyspnea, unspecified: Secondary | ICD-10-CM | POA: Diagnosis not present

## 2022-04-01 DIAGNOSIS — J479 Bronchiectasis, uncomplicated: Secondary | ICD-10-CM | POA: Diagnosis not present

## 2022-04-01 DIAGNOSIS — J45909 Unspecified asthma, uncomplicated: Secondary | ICD-10-CM | POA: Diagnosis not present

## 2022-04-01 DIAGNOSIS — R0600 Dyspnea, unspecified: Secondary | ICD-10-CM | POA: Diagnosis not present

## 2022-04-01 DIAGNOSIS — E43 Unspecified severe protein-calorie malnutrition: Secondary | ICD-10-CM | POA: Diagnosis not present

## 2022-04-01 DIAGNOSIS — I2510 Atherosclerotic heart disease of native coronary artery without angina pectoris: Secondary | ICD-10-CM | POA: Diagnosis not present

## 2022-04-01 DIAGNOSIS — I252 Old myocardial infarction: Secondary | ICD-10-CM | POA: Diagnosis not present

## 2022-04-02 ENCOUNTER — Telehealth (INDEPENDENT_AMBULATORY_CARE_PROVIDER_SITE_OTHER): Payer: Medicare Other | Admitting: Emergency Medicine

## 2022-04-02 ENCOUNTER — Encounter: Payer: Self-pay | Admitting: Emergency Medicine

## 2022-04-02 DIAGNOSIS — I2510 Atherosclerotic heart disease of native coronary artery without angina pectoris: Secondary | ICD-10-CM | POA: Diagnosis not present

## 2022-04-02 DIAGNOSIS — J45909 Unspecified asthma, uncomplicated: Secondary | ICD-10-CM | POA: Diagnosis not present

## 2022-04-02 DIAGNOSIS — E43 Unspecified severe protein-calorie malnutrition: Secondary | ICD-10-CM | POA: Diagnosis not present

## 2022-04-02 DIAGNOSIS — I252 Old myocardial infarction: Secondary | ICD-10-CM | POA: Diagnosis not present

## 2022-04-02 DIAGNOSIS — U071 COVID-19: Secondary | ICD-10-CM | POA: Diagnosis not present

## 2022-04-02 DIAGNOSIS — R0600 Dyspnea, unspecified: Secondary | ICD-10-CM | POA: Diagnosis not present

## 2022-04-02 DIAGNOSIS — J479 Bronchiectasis, uncomplicated: Secondary | ICD-10-CM | POA: Diagnosis not present

## 2022-04-02 MED ORDER — MOLNUPIRAVIR EUA 200MG CAPSULE: 4.0000 | ORAL_CAPSULE | Freq: Two times a day (BID) | ORAL | 0 refills | Status: AC

## 2022-04-02 NOTE — Progress Notes (Signed)
Virtual Visit via Video Note  I connected with Sarah [name] on 04/02/22 at  2:45 PM EDT by a video enabled telemedicine application and verified that I am speaking with the correct person using two identifiers.  Location: Patient: Home Provider: Office   I discussed the limitations of evaluation and management by telemedicine and the availability of in person appointments. The patient expressed understanding and agreed to proceed.  History of Present Illness: 89 year old woman whom I have followed for bronchiectasis and chronic upper airway irritation syndrome, moderate COPD.  She has chronic cough associated with these.  She is colonized with Pseudomonas.  I been managing her on chronic azithromycin and prednisone.  Her current dose is 10mg . Using albuterol 2-3x a day.    Observations/Objective: She has been experiencing increased cough 3/16, felt terrible. No fever. Increased dyspnea. Some associated panic. Has continued to have f/u w hospice RN coming to the home. She has been most bothered in the am.   Assessment and Plan: - we will try molnupiravir - albuterol nebs on schedule - pred 10mg  - hospice input - if she declines from this acute illness then will have to transition to comfort care   Follow Up Instructions:  Video visit 2 weeks.    I discussed the assessment and treatment plan with the patient. The patient was provided an opportunity to ask questions and all were answered. The patient agreed with the plan and demonstrated an understanding of the instructions.   The patient was advised to call back or seek an in-person evaluation if the symptoms worsen or if the condition fails to improve as anticipated.  I provided 30 minutes of non-face-to-face time during this encounter.   Collene Gobble, MD

## 2022-04-03 DIAGNOSIS — R0600 Dyspnea, unspecified: Secondary | ICD-10-CM | POA: Diagnosis not present

## 2022-04-03 DIAGNOSIS — I252 Old myocardial infarction: Secondary | ICD-10-CM | POA: Diagnosis not present

## 2022-04-03 DIAGNOSIS — I2510 Atherosclerotic heart disease of native coronary artery without angina pectoris: Secondary | ICD-10-CM | POA: Diagnosis not present

## 2022-04-03 DIAGNOSIS — E43 Unspecified severe protein-calorie malnutrition: Secondary | ICD-10-CM | POA: Diagnosis not present

## 2022-04-03 DIAGNOSIS — J479 Bronchiectasis, uncomplicated: Secondary | ICD-10-CM | POA: Diagnosis not present

## 2022-04-03 DIAGNOSIS — J45909 Unspecified asthma, uncomplicated: Secondary | ICD-10-CM | POA: Diagnosis not present

## 2022-04-04 DIAGNOSIS — I2510 Atherosclerotic heart disease of native coronary artery without angina pectoris: Secondary | ICD-10-CM | POA: Diagnosis not present

## 2022-04-04 DIAGNOSIS — J479 Bronchiectasis, uncomplicated: Secondary | ICD-10-CM | POA: Diagnosis not present

## 2022-04-04 DIAGNOSIS — J45909 Unspecified asthma, uncomplicated: Secondary | ICD-10-CM | POA: Diagnosis not present

## 2022-04-04 DIAGNOSIS — I252 Old myocardial infarction: Secondary | ICD-10-CM | POA: Diagnosis not present

## 2022-04-04 DIAGNOSIS — R0600 Dyspnea, unspecified: Secondary | ICD-10-CM | POA: Diagnosis not present

## 2022-04-04 DIAGNOSIS — E43 Unspecified severe protein-calorie malnutrition: Secondary | ICD-10-CM | POA: Diagnosis not present

## 2022-04-05 ENCOUNTER — Encounter: Payer: Self-pay | Admitting: Emergency Medicine

## 2022-04-05 DIAGNOSIS — R0600 Dyspnea, unspecified: Secondary | ICD-10-CM | POA: Diagnosis not present

## 2022-04-05 DIAGNOSIS — I2510 Atherosclerotic heart disease of native coronary artery without angina pectoris: Secondary | ICD-10-CM | POA: Diagnosis not present

## 2022-04-05 DIAGNOSIS — J479 Bronchiectasis, uncomplicated: Secondary | ICD-10-CM | POA: Diagnosis not present

## 2022-04-05 DIAGNOSIS — J45909 Unspecified asthma, uncomplicated: Secondary | ICD-10-CM | POA: Diagnosis not present

## 2022-04-05 DIAGNOSIS — E43 Unspecified severe protein-calorie malnutrition: Secondary | ICD-10-CM | POA: Diagnosis not present

## 2022-04-05 DIAGNOSIS — I252 Old myocardial infarction: Secondary | ICD-10-CM | POA: Diagnosis not present

## 2022-04-05 NOTE — Telephone Encounter (Signed)
The documentation we have in the chart indicates nausea/vomiting with hydrocodone, oxycodone.  Also indicates tachycardia, bradycardia.  Apparently anaphylaxis to codeine is also documented. Nausea/vomiting is an intolerance, and I think this will still allow [place] to use morphine if needed.  I would be comfortable recommending of her prescribing it if need be

## 2022-04-06 DIAGNOSIS — J45909 Unspecified asthma, uncomplicated: Secondary | ICD-10-CM | POA: Diagnosis not present

## 2022-04-06 DIAGNOSIS — I252 Old myocardial infarction: Secondary | ICD-10-CM | POA: Diagnosis not present

## 2022-04-06 DIAGNOSIS — R0600 Dyspnea, unspecified: Secondary | ICD-10-CM | POA: Diagnosis not present

## 2022-04-06 DIAGNOSIS — I2510 Atherosclerotic heart disease of native coronary artery without angina pectoris: Secondary | ICD-10-CM | POA: Diagnosis not present

## 2022-04-06 DIAGNOSIS — E43 Unspecified severe protein-calorie malnutrition: Secondary | ICD-10-CM | POA: Diagnosis not present

## 2022-04-06 DIAGNOSIS — J479 Bronchiectasis, uncomplicated: Secondary | ICD-10-CM | POA: Diagnosis not present

## 2022-04-08 DIAGNOSIS — J45909 Unspecified asthma, uncomplicated: Secondary | ICD-10-CM | POA: Diagnosis not present

## 2022-04-08 DIAGNOSIS — I252 Old myocardial infarction: Secondary | ICD-10-CM | POA: Diagnosis not present

## 2022-04-08 DIAGNOSIS — E43 Unspecified severe protein-calorie malnutrition: Secondary | ICD-10-CM | POA: Diagnosis not present

## 2022-04-08 DIAGNOSIS — I2510 Atherosclerotic heart disease of native coronary artery without angina pectoris: Secondary | ICD-10-CM | POA: Diagnosis not present

## 2022-04-08 DIAGNOSIS — J479 Bronchiectasis, uncomplicated: Secondary | ICD-10-CM | POA: Diagnosis not present

## 2022-04-08 DIAGNOSIS — R0600 Dyspnea, unspecified: Secondary | ICD-10-CM | POA: Diagnosis not present

## 2022-04-10 ENCOUNTER — Telehealth: Payer: Self-pay | Admitting: Emergency Medicine

## 2022-04-10 DIAGNOSIS — J479 Bronchiectasis, uncomplicated: Secondary | ICD-10-CM | POA: Diagnosis not present

## 2022-04-10 DIAGNOSIS — I2510 Atherosclerotic heart disease of native coronary artery without angina pectoris: Secondary | ICD-10-CM | POA: Diagnosis not present

## 2022-04-10 DIAGNOSIS — J45909 Unspecified asthma, uncomplicated: Secondary | ICD-10-CM | POA: Diagnosis not present

## 2022-04-10 DIAGNOSIS — E43 Unspecified severe protein-calorie malnutrition: Secondary | ICD-10-CM | POA: Diagnosis not present

## 2022-04-10 DIAGNOSIS — I252 Old myocardial infarction: Secondary | ICD-10-CM | POA: Diagnosis not present

## 2022-04-10 DIAGNOSIS — R0600 Dyspnea, unspecified: Secondary | ICD-10-CM | POA: Diagnosis not present

## 2022-04-10 NOTE — Telephone Encounter (Signed)
[place] about Sarah [name]. The Doctor and family are uncomfortable with giving pt morphine due to side effects. They wanted to know if you agree with dilaudid ? [name] please advise

## 2022-04-10 NOTE — Telephone Encounter (Signed)
hospice on the phone and needs to speak to someone about meds that need to be given to pt. She has a allergy to codeine meds and there Dr. Is not comfortable giving morphine

## 2022-04-10 NOTE — Telephone Encounter (Signed)
Called and spoke with Seth Bake, pt's hospice nurse and made her aware of the recommendations per Dr Lamonte Sakai. She verbalized understanding and will let the hospice and pt's family know. Nothing further needed.

## 2022-04-10 NOTE — Telephone Encounter (Signed)
Yes ok to use dilaudid. Would recommend 0.25 to 0.5mg  q6h prn for pain or SOB for starters

## 2022-04-11 DIAGNOSIS — J45909 Unspecified asthma, uncomplicated: Secondary | ICD-10-CM | POA: Diagnosis not present

## 2022-04-11 DIAGNOSIS — R0600 Dyspnea, unspecified: Secondary | ICD-10-CM | POA: Diagnosis not present

## 2022-04-11 DIAGNOSIS — E43 Unspecified severe protein-calorie malnutrition: Secondary | ICD-10-CM | POA: Diagnosis not present

## 2022-04-11 DIAGNOSIS — J479 Bronchiectasis, uncomplicated: Secondary | ICD-10-CM | POA: Diagnosis not present

## 2022-04-11 DIAGNOSIS — I252 Old myocardial infarction: Secondary | ICD-10-CM | POA: Diagnosis not present

## 2022-04-11 DIAGNOSIS — I2510 Atherosclerotic heart disease of native coronary artery without angina pectoris: Secondary | ICD-10-CM | POA: Diagnosis not present

## 2022-04-12 DIAGNOSIS — J479 Bronchiectasis, uncomplicated: Secondary | ICD-10-CM | POA: Diagnosis not present

## 2022-04-12 DIAGNOSIS — R0600 Dyspnea, unspecified: Secondary | ICD-10-CM | POA: Diagnosis not present

## 2022-04-12 DIAGNOSIS — E43 Unspecified severe protein-calorie malnutrition: Secondary | ICD-10-CM | POA: Diagnosis not present

## 2022-04-12 DIAGNOSIS — I252 Old myocardial infarction: Secondary | ICD-10-CM | POA: Diagnosis not present

## 2022-04-12 DIAGNOSIS — J45909 Unspecified asthma, uncomplicated: Secondary | ICD-10-CM | POA: Diagnosis not present

## 2022-04-12 DIAGNOSIS — I2510 Atherosclerotic heart disease of native coronary artery without angina pectoris: Secondary | ICD-10-CM | POA: Diagnosis not present

## 2022-04-15 DIAGNOSIS — D631 Anemia in chronic kidney disease: Secondary | ICD-10-CM | POA: Diagnosis not present

## 2022-04-15 DIAGNOSIS — Z8673 Personal history of transient ischemic attack (TIA), and cerebral infarction without residual deficits: Secondary | ICD-10-CM | POA: Diagnosis not present

## 2022-04-15 DIAGNOSIS — R159 Full incontinence of feces: Secondary | ICD-10-CM | POA: Diagnosis not present

## 2022-04-15 DIAGNOSIS — I252 Old myocardial infarction: Secondary | ICD-10-CM | POA: Diagnosis not present

## 2022-04-15 DIAGNOSIS — E039 Hypothyroidism, unspecified: Secondary | ICD-10-CM | POA: Diagnosis not present

## 2022-04-15 DIAGNOSIS — F329 Major depressive disorder, single episode, unspecified: Secondary | ICD-10-CM | POA: Diagnosis not present

## 2022-04-15 DIAGNOSIS — E785 Hyperlipidemia, unspecified: Secondary | ICD-10-CM | POA: Diagnosis not present

## 2022-04-15 DIAGNOSIS — R296 Repeated falls: Secondary | ICD-10-CM | POA: Diagnosis not present

## 2022-04-15 DIAGNOSIS — K219 Gastro-esophageal reflux disease without esophagitis: Secondary | ICD-10-CM | POA: Diagnosis not present

## 2022-04-15 DIAGNOSIS — U071 COVID-19: Secondary | ICD-10-CM | POA: Diagnosis not present

## 2022-04-15 DIAGNOSIS — Z8701 Personal history of pneumonia (recurrent): Secondary | ICD-10-CM | POA: Diagnosis not present

## 2022-04-15 DIAGNOSIS — Z9981 Dependence on supplemental oxygen: Secondary | ICD-10-CM | POA: Diagnosis not present

## 2022-04-15 DIAGNOSIS — R32 Unspecified urinary incontinence: Secondary | ICD-10-CM | POA: Diagnosis not present

## 2022-04-15 DIAGNOSIS — R1310 Dysphagia, unspecified: Secondary | ICD-10-CM | POA: Diagnosis not present

## 2022-04-15 DIAGNOSIS — J479 Bronchiectasis, uncomplicated: Secondary | ICD-10-CM | POA: Diagnosis not present

## 2022-04-15 DIAGNOSIS — I509 Heart failure, unspecified: Secondary | ICD-10-CM | POA: Diagnosis not present

## 2022-04-15 DIAGNOSIS — N183 Chronic kidney disease, stage 3 unspecified: Secondary | ICD-10-CM | POA: Diagnosis not present

## 2022-04-15 DIAGNOSIS — I679 Cerebrovascular disease, unspecified: Secondary | ICD-10-CM | POA: Diagnosis not present

## 2022-04-15 DIAGNOSIS — E43 Unspecified severe protein-calorie malnutrition: Secondary | ICD-10-CM | POA: Diagnosis not present

## 2022-04-15 DIAGNOSIS — Z681 Body mass index (BMI) 19 or less, adult: Secondary | ICD-10-CM | POA: Diagnosis not present

## 2022-04-15 DIAGNOSIS — G629 Polyneuropathy, unspecified: Secondary | ICD-10-CM | POA: Diagnosis not present

## 2022-04-15 DIAGNOSIS — I130 Hypertensive heart and chronic kidney disease with heart failure and stage 1 through stage 4 chronic kidney disease, or unspecified chronic kidney disease: Secondary | ICD-10-CM | POA: Diagnosis not present

## 2022-04-15 DIAGNOSIS — I2510 Atherosclerotic heart disease of native coronary artery without angina pectoris: Secondary | ICD-10-CM | POA: Diagnosis not present

## 2022-04-15 DIAGNOSIS — J45909 Unspecified asthma, uncomplicated: Secondary | ICD-10-CM | POA: Diagnosis not present

## 2022-04-15 DIAGNOSIS — R0600 Dyspnea, unspecified: Secondary | ICD-10-CM | POA: Diagnosis not present

## 2022-04-16 DIAGNOSIS — J479 Bronchiectasis, uncomplicated: Secondary | ICD-10-CM | POA: Diagnosis not present

## 2022-04-16 DIAGNOSIS — I2510 Atherosclerotic heart disease of native coronary artery without angina pectoris: Secondary | ICD-10-CM | POA: Diagnosis not present

## 2022-04-16 DIAGNOSIS — J45909 Unspecified asthma, uncomplicated: Secondary | ICD-10-CM | POA: Diagnosis not present

## 2022-04-16 DIAGNOSIS — R0600 Dyspnea, unspecified: Secondary | ICD-10-CM | POA: Diagnosis not present

## 2022-04-16 DIAGNOSIS — E43 Unspecified severe protein-calorie malnutrition: Secondary | ICD-10-CM | POA: Diagnosis not present

## 2022-04-16 DIAGNOSIS — I252 Old myocardial infarction: Secondary | ICD-10-CM | POA: Diagnosis not present

## 2022-04-18 ENCOUNTER — Telehealth (INDEPENDENT_AMBULATORY_CARE_PROVIDER_SITE_OTHER): Payer: Medicare Other | Admitting: Emergency Medicine

## 2022-04-18 ENCOUNTER — Encounter: Payer: Self-pay | Admitting: Emergency Medicine

## 2022-04-18 DIAGNOSIS — J479 Bronchiectasis, uncomplicated: Secondary | ICD-10-CM | POA: Diagnosis not present

## 2022-04-18 NOTE — Progress Notes (Signed)
Virtual Visit via Video Note  I connected with Sarah [name] on 04/18/22 at  4:00 PM EDT by a video enabled telemedicine application and verified that I am speaking with the correct person using two identifiers.  Location: Patient: Home Provider: Office   I discussed the limitations of evaluation and management by telemedicine and the availability of in person appointments. The patient expressed understanding and agreed to proceed.  History of Present Illness: 89 year old woman with severe bronchiectasis and chronic upper airway irritation and cough, moderate COPD, pseudomonal colonization.  She is on chronic prednisone 10 mg daily, daily azithromycin, albuterol nebs.  Unfortunately she just had COVID-19.  We treated her with molnupiravir.  She has home hospice.   Observations/Objective: She received molnupiravir I had recommended that we add morphine as an adjunct to manage dyspnea.  Hospice was concerned about this because she has allergies to codeine, hydrocodone, oxycodone.  They preferred Dilaudid  She is doing fair - her COVID sx and SOB did improve.  She is experiencing intermittent weakness, without warning. Occasionally some rib pain. She gets very frightened  when the dyspnea happens She is doing her albuterol nebs prn, 2-3x a day.  They have the dilaudid, haven't used it yet.   Assessment and Plan: Plan to continue same regimen, prednisone 10 mg, scheduled azithromycin, albuterol as needed, at least 2 times daily She has Dilaudid available to use as needed, has not been used yet.  She is having episodic dyspnea/weakness and then panic.  Explained to her daughters that these may be times to try the Dilaudid.  We are going to try dose as low as possible, should be able to give 0.125 mg  Follow Up Instructions: 1 month video visit w APP   I discussed the assessment and treatment plan with the patient. The patient was provided an opportunity to ask questions and all were  answered. The patient agreed with the plan and demonstrated an understanding of the instructions.   The patient was advised to call back or seek an in-person evaluation if the symptoms worsen or if the condition fails to improve as anticipated.  I provided 20 minutes of non-face-to-face time during this encounter.   Collene Gobble, MD

## 2022-04-22 DIAGNOSIS — R0600 Dyspnea, unspecified: Secondary | ICD-10-CM | POA: Diagnosis not present

## 2022-04-22 DIAGNOSIS — J479 Bronchiectasis, uncomplicated: Secondary | ICD-10-CM | POA: Diagnosis not present

## 2022-04-22 DIAGNOSIS — I252 Old myocardial infarction: Secondary | ICD-10-CM | POA: Diagnosis not present

## 2022-04-22 DIAGNOSIS — E43 Unspecified severe protein-calorie malnutrition: Secondary | ICD-10-CM | POA: Diagnosis not present

## 2022-04-22 DIAGNOSIS — I2510 Atherosclerotic heart disease of native coronary artery without angina pectoris: Secondary | ICD-10-CM | POA: Diagnosis not present

## 2022-04-22 DIAGNOSIS — J45909 Unspecified asthma, uncomplicated: Secondary | ICD-10-CM | POA: Diagnosis not present

## 2022-04-30 DIAGNOSIS — I252 Old myocardial infarction: Secondary | ICD-10-CM | POA: Diagnosis not present

## 2022-04-30 DIAGNOSIS — I2510 Atherosclerotic heart disease of native coronary artery without angina pectoris: Secondary | ICD-10-CM | POA: Diagnosis not present

## 2022-04-30 DIAGNOSIS — R0600 Dyspnea, unspecified: Secondary | ICD-10-CM | POA: Diagnosis not present

## 2022-04-30 DIAGNOSIS — J45909 Unspecified asthma, uncomplicated: Secondary | ICD-10-CM | POA: Diagnosis not present

## 2022-04-30 DIAGNOSIS — J479 Bronchiectasis, uncomplicated: Secondary | ICD-10-CM | POA: Diagnosis not present

## 2022-04-30 DIAGNOSIS — E43 Unspecified severe protein-calorie malnutrition: Secondary | ICD-10-CM | POA: Diagnosis not present

## 2022-05-02 DIAGNOSIS — I252 Old myocardial infarction: Secondary | ICD-10-CM | POA: Diagnosis not present

## 2022-05-02 DIAGNOSIS — E43 Unspecified severe protein-calorie malnutrition: Secondary | ICD-10-CM | POA: Diagnosis not present

## 2022-05-02 DIAGNOSIS — J45909 Unspecified asthma, uncomplicated: Secondary | ICD-10-CM | POA: Diagnosis not present

## 2022-05-02 DIAGNOSIS — R0600 Dyspnea, unspecified: Secondary | ICD-10-CM | POA: Diagnosis not present

## 2022-05-02 DIAGNOSIS — I2510 Atherosclerotic heart disease of native coronary artery without angina pectoris: Secondary | ICD-10-CM | POA: Diagnosis not present

## 2022-05-02 DIAGNOSIS — J479 Bronchiectasis, uncomplicated: Secondary | ICD-10-CM | POA: Diagnosis not present

## 2022-05-03 DIAGNOSIS — J45909 Unspecified asthma, uncomplicated: Secondary | ICD-10-CM | POA: Diagnosis not present

## 2022-05-03 DIAGNOSIS — E43 Unspecified severe protein-calorie malnutrition: Secondary | ICD-10-CM | POA: Diagnosis not present

## 2022-05-03 DIAGNOSIS — I252 Old myocardial infarction: Secondary | ICD-10-CM | POA: Diagnosis not present

## 2022-05-03 DIAGNOSIS — J479 Bronchiectasis, uncomplicated: Secondary | ICD-10-CM | POA: Diagnosis not present

## 2022-05-03 DIAGNOSIS — R0600 Dyspnea, unspecified: Secondary | ICD-10-CM | POA: Diagnosis not present

## 2022-05-03 DIAGNOSIS — I2510 Atherosclerotic heart disease of native coronary artery without angina pectoris: Secondary | ICD-10-CM | POA: Diagnosis not present

## 2022-05-06 DIAGNOSIS — I252 Old myocardial infarction: Secondary | ICD-10-CM | POA: Diagnosis not present

## 2022-05-06 DIAGNOSIS — R0600 Dyspnea, unspecified: Secondary | ICD-10-CM | POA: Diagnosis not present

## 2022-05-06 DIAGNOSIS — J45909 Unspecified asthma, uncomplicated: Secondary | ICD-10-CM | POA: Diagnosis not present

## 2022-05-06 DIAGNOSIS — E43 Unspecified severe protein-calorie malnutrition: Secondary | ICD-10-CM | POA: Diagnosis not present

## 2022-05-06 DIAGNOSIS — I2510 Atherosclerotic heart disease of native coronary artery without angina pectoris: Secondary | ICD-10-CM | POA: Diagnosis not present

## 2022-05-06 DIAGNOSIS — J479 Bronchiectasis, uncomplicated: Secondary | ICD-10-CM | POA: Diagnosis not present

## 2022-05-07 ENCOUNTER — Ambulatory Visit: Payer: Medicare Other | Admitting: Podiatry

## 2022-05-07 DIAGNOSIS — R0600 Dyspnea, unspecified: Secondary | ICD-10-CM | POA: Diagnosis not present

## 2022-05-07 DIAGNOSIS — I252 Old myocardial infarction: Secondary | ICD-10-CM | POA: Diagnosis not present

## 2022-05-07 DIAGNOSIS — E43 Unspecified severe protein-calorie malnutrition: Secondary | ICD-10-CM | POA: Diagnosis not present

## 2022-05-07 DIAGNOSIS — J45909 Unspecified asthma, uncomplicated: Secondary | ICD-10-CM | POA: Diagnosis not present

## 2022-05-07 DIAGNOSIS — I2510 Atherosclerotic heart disease of native coronary artery without angina pectoris: Secondary | ICD-10-CM | POA: Diagnosis not present

## 2022-05-07 DIAGNOSIS — J479 Bronchiectasis, uncomplicated: Secondary | ICD-10-CM | POA: Diagnosis not present

## 2022-05-08 DIAGNOSIS — E43 Unspecified severe protein-calorie malnutrition: Secondary | ICD-10-CM | POA: Diagnosis not present

## 2022-05-08 DIAGNOSIS — J45909 Unspecified asthma, uncomplicated: Secondary | ICD-10-CM | POA: Diagnosis not present

## 2022-05-08 DIAGNOSIS — I2510 Atherosclerotic heart disease of native coronary artery without angina pectoris: Secondary | ICD-10-CM | POA: Diagnosis not present

## 2022-05-08 DIAGNOSIS — J479 Bronchiectasis, uncomplicated: Secondary | ICD-10-CM | POA: Diagnosis not present

## 2022-05-08 DIAGNOSIS — I252 Old myocardial infarction: Secondary | ICD-10-CM | POA: Diagnosis not present

## 2022-05-08 DIAGNOSIS — R0600 Dyspnea, unspecified: Secondary | ICD-10-CM | POA: Diagnosis not present

## 2022-05-09 DIAGNOSIS — J471 Bronchiectasis with (acute) exacerbation: Secondary | ICD-10-CM | POA: Diagnosis not present

## 2022-05-09 DIAGNOSIS — R0600 Dyspnea, unspecified: Secondary | ICD-10-CM | POA: Diagnosis not present

## 2022-05-09 DIAGNOSIS — J479 Bronchiectasis, uncomplicated: Secondary | ICD-10-CM | POA: Diagnosis not present

## 2022-05-09 DIAGNOSIS — I2510 Atherosclerotic heart disease of native coronary artery without angina pectoris: Secondary | ICD-10-CM | POA: Diagnosis not present

## 2022-05-09 DIAGNOSIS — F411 Generalized anxiety disorder: Secondary | ICD-10-CM | POA: Diagnosis not present

## 2022-05-09 DIAGNOSIS — Z8673 Personal history of transient ischemic attack (TIA), and cerebral infarction without residual deficits: Secondary | ICD-10-CM | POA: Diagnosis not present

## 2022-05-09 DIAGNOSIS — R54 Age-related physical debility: Secondary | ICD-10-CM | POA: Diagnosis not present

## 2022-05-09 DIAGNOSIS — I252 Old myocardial infarction: Secondary | ICD-10-CM | POA: Diagnosis not present

## 2022-05-09 DIAGNOSIS — E43 Unspecified severe protein-calorie malnutrition: Secondary | ICD-10-CM | POA: Diagnosis not present

## 2022-05-09 DIAGNOSIS — J45909 Unspecified asthma, uncomplicated: Secondary | ICD-10-CM | POA: Diagnosis not present

## 2022-05-10 DIAGNOSIS — J479 Bronchiectasis, uncomplicated: Secondary | ICD-10-CM | POA: Diagnosis not present

## 2022-05-10 DIAGNOSIS — J45909 Unspecified asthma, uncomplicated: Secondary | ICD-10-CM | POA: Diagnosis not present

## 2022-05-10 DIAGNOSIS — I252 Old myocardial infarction: Secondary | ICD-10-CM | POA: Diagnosis not present

## 2022-05-10 DIAGNOSIS — I2510 Atherosclerotic heart disease of native coronary artery without angina pectoris: Secondary | ICD-10-CM | POA: Diagnosis not present

## 2022-05-10 DIAGNOSIS — E43 Unspecified severe protein-calorie malnutrition: Secondary | ICD-10-CM | POA: Diagnosis not present

## 2022-05-10 DIAGNOSIS — R0600 Dyspnea, unspecified: Secondary | ICD-10-CM | POA: Diagnosis not present

## 2022-05-13 DIAGNOSIS — J45909 Unspecified asthma, uncomplicated: Secondary | ICD-10-CM | POA: Diagnosis not present

## 2022-05-13 DIAGNOSIS — I2510 Atherosclerotic heart disease of native coronary artery without angina pectoris: Secondary | ICD-10-CM | POA: Diagnosis not present

## 2022-05-13 DIAGNOSIS — I252 Old myocardial infarction: Secondary | ICD-10-CM | POA: Diagnosis not present

## 2022-05-13 DIAGNOSIS — J479 Bronchiectasis, uncomplicated: Secondary | ICD-10-CM | POA: Diagnosis not present

## 2022-05-13 DIAGNOSIS — E43 Unspecified severe protein-calorie malnutrition: Secondary | ICD-10-CM | POA: Diagnosis not present

## 2022-05-13 DIAGNOSIS — R0600 Dyspnea, unspecified: Secondary | ICD-10-CM | POA: Diagnosis not present

## 2022-05-14 DIAGNOSIS — J479 Bronchiectasis, uncomplicated: Secondary | ICD-10-CM | POA: Diagnosis not present

## 2022-05-14 DIAGNOSIS — E43 Unspecified severe protein-calorie malnutrition: Secondary | ICD-10-CM | POA: Diagnosis not present

## 2022-05-14 DIAGNOSIS — I252 Old myocardial infarction: Secondary | ICD-10-CM | POA: Diagnosis not present

## 2022-05-14 DIAGNOSIS — R0600 Dyspnea, unspecified: Secondary | ICD-10-CM | POA: Diagnosis not present

## 2022-05-14 DIAGNOSIS — I2510 Atherosclerotic heart disease of native coronary artery without angina pectoris: Secondary | ICD-10-CM | POA: Diagnosis not present

## 2022-05-14 DIAGNOSIS — J45909 Unspecified asthma, uncomplicated: Secondary | ICD-10-CM | POA: Diagnosis not present

## 2022-05-15 DIAGNOSIS — R627 Adult failure to thrive: Secondary | ICD-10-CM | POA: Diagnosis not present

## 2022-05-15 DIAGNOSIS — Z9981 Dependence on supplemental oxygen: Secondary | ICD-10-CM | POA: Diagnosis not present

## 2022-05-15 DIAGNOSIS — J45909 Unspecified asthma, uncomplicated: Secondary | ICD-10-CM | POA: Diagnosis not present

## 2022-05-15 DIAGNOSIS — F329 Major depressive disorder, single episode, unspecified: Secondary | ICD-10-CM | POA: Diagnosis not present

## 2022-05-15 DIAGNOSIS — K219 Gastro-esophageal reflux disease without esophagitis: Secondary | ICD-10-CM | POA: Diagnosis not present

## 2022-05-15 DIAGNOSIS — R0600 Dyspnea, unspecified: Secondary | ICD-10-CM | POA: Diagnosis not present

## 2022-05-15 DIAGNOSIS — E43 Unspecified severe protein-calorie malnutrition: Secondary | ICD-10-CM | POA: Diagnosis not present

## 2022-05-15 DIAGNOSIS — D631 Anemia in chronic kidney disease: Secondary | ICD-10-CM | POA: Diagnosis not present

## 2022-05-15 DIAGNOSIS — Z8701 Personal history of pneumonia (recurrent): Secondary | ICD-10-CM | POA: Diagnosis not present

## 2022-05-15 DIAGNOSIS — R32 Unspecified urinary incontinence: Secondary | ICD-10-CM | POA: Diagnosis not present

## 2022-05-15 DIAGNOSIS — J479 Bronchiectasis, uncomplicated: Secondary | ICD-10-CM | POA: Diagnosis not present

## 2022-05-15 DIAGNOSIS — R1310 Dysphagia, unspecified: Secondary | ICD-10-CM | POA: Diagnosis not present

## 2022-05-15 DIAGNOSIS — N183 Chronic kidney disease, stage 3 unspecified: Secondary | ICD-10-CM | POA: Diagnosis not present

## 2022-05-15 DIAGNOSIS — Z8616 Personal history of COVID-19: Secondary | ICD-10-CM | POA: Diagnosis not present

## 2022-05-15 DIAGNOSIS — R296 Repeated falls: Secondary | ICD-10-CM | POA: Diagnosis not present

## 2022-05-15 DIAGNOSIS — E785 Hyperlipidemia, unspecified: Secondary | ICD-10-CM | POA: Diagnosis not present

## 2022-05-15 DIAGNOSIS — Z681 Body mass index (BMI) 19 or less, adult: Secondary | ICD-10-CM | POA: Diagnosis not present

## 2022-05-15 DIAGNOSIS — G629 Polyneuropathy, unspecified: Secondary | ICD-10-CM | POA: Diagnosis not present

## 2022-05-15 DIAGNOSIS — I2510 Atherosclerotic heart disease of native coronary artery without angina pectoris: Secondary | ICD-10-CM | POA: Diagnosis not present

## 2022-05-15 DIAGNOSIS — I130 Hypertensive heart and chronic kidney disease with heart failure and stage 1 through stage 4 chronic kidney disease, or unspecified chronic kidney disease: Secondary | ICD-10-CM | POA: Diagnosis not present

## 2022-05-15 DIAGNOSIS — Z8673 Personal history of transient ischemic attack (TIA), and cerebral infarction without residual deficits: Secondary | ICD-10-CM | POA: Diagnosis not present

## 2022-05-15 DIAGNOSIS — I252 Old myocardial infarction: Secondary | ICD-10-CM | POA: Diagnosis not present

## 2022-05-15 DIAGNOSIS — I679 Cerebrovascular disease, unspecified: Secondary | ICD-10-CM | POA: Diagnosis not present

## 2022-05-15 DIAGNOSIS — R159 Full incontinence of feces: Secondary | ICD-10-CM | POA: Diagnosis not present

## 2022-05-15 DIAGNOSIS — I509 Heart failure, unspecified: Secondary | ICD-10-CM | POA: Diagnosis not present

## 2022-05-16 DIAGNOSIS — R0600 Dyspnea, unspecified: Secondary | ICD-10-CM | POA: Diagnosis not present

## 2022-05-16 DIAGNOSIS — R627 Adult failure to thrive: Secondary | ICD-10-CM | POA: Diagnosis not present

## 2022-05-16 DIAGNOSIS — E43 Unspecified severe protein-calorie malnutrition: Secondary | ICD-10-CM | POA: Diagnosis not present

## 2022-05-16 DIAGNOSIS — J479 Bronchiectasis, uncomplicated: Secondary | ICD-10-CM | POA: Diagnosis not present

## 2022-05-16 DIAGNOSIS — J45909 Unspecified asthma, uncomplicated: Secondary | ICD-10-CM | POA: Diagnosis not present

## 2022-05-16 DIAGNOSIS — R296 Repeated falls: Secondary | ICD-10-CM | POA: Diagnosis not present

## 2022-05-17 DIAGNOSIS — R627 Adult failure to thrive: Secondary | ICD-10-CM | POA: Diagnosis not present

## 2022-05-17 DIAGNOSIS — R296 Repeated falls: Secondary | ICD-10-CM | POA: Diagnosis not present

## 2022-05-17 DIAGNOSIS — J479 Bronchiectasis, uncomplicated: Secondary | ICD-10-CM | POA: Diagnosis not present

## 2022-05-17 DIAGNOSIS — E43 Unspecified severe protein-calorie malnutrition: Secondary | ICD-10-CM | POA: Diagnosis not present

## 2022-05-17 DIAGNOSIS — J45909 Unspecified asthma, uncomplicated: Secondary | ICD-10-CM | POA: Diagnosis not present

## 2022-05-17 DIAGNOSIS — R0600 Dyspnea, unspecified: Secondary | ICD-10-CM | POA: Diagnosis not present

## 2022-05-19 DIAGNOSIS — J479 Bronchiectasis, uncomplicated: Secondary | ICD-10-CM | POA: Diagnosis not present

## 2022-05-19 DIAGNOSIS — J45909 Unspecified asthma, uncomplicated: Secondary | ICD-10-CM | POA: Diagnosis not present

## 2022-05-19 DIAGNOSIS — R627 Adult failure to thrive: Secondary | ICD-10-CM | POA: Diagnosis not present

## 2022-05-19 DIAGNOSIS — E43 Unspecified severe protein-calorie malnutrition: Secondary | ICD-10-CM | POA: Diagnosis not present

## 2022-05-19 DIAGNOSIS — R0600 Dyspnea, unspecified: Secondary | ICD-10-CM | POA: Diagnosis not present

## 2022-05-19 DIAGNOSIS — R296 Repeated falls: Secondary | ICD-10-CM | POA: Diagnosis not present

## 2022-05-20 ENCOUNTER — Telehealth: Payer: Medicare Other | Admitting: Nurse Practitioner

## 2022-05-20 DIAGNOSIS — J45909 Unspecified asthma, uncomplicated: Secondary | ICD-10-CM | POA: Diagnosis not present

## 2022-05-20 DIAGNOSIS — E43 Unspecified severe protein-calorie malnutrition: Secondary | ICD-10-CM | POA: Diagnosis not present

## 2022-05-20 DIAGNOSIS — R627 Adult failure to thrive: Secondary | ICD-10-CM | POA: Diagnosis not present

## 2022-05-20 DIAGNOSIS — J479 Bronchiectasis, uncomplicated: Secondary | ICD-10-CM | POA: Diagnosis not present

## 2022-05-20 DIAGNOSIS — R0600 Dyspnea, unspecified: Secondary | ICD-10-CM | POA: Diagnosis not present

## 2022-05-20 DIAGNOSIS — R296 Repeated falls: Secondary | ICD-10-CM | POA: Diagnosis not present

## 2022-05-21 DIAGNOSIS — R627 Adult failure to thrive: Secondary | ICD-10-CM | POA: Diagnosis not present

## 2022-05-21 DIAGNOSIS — J45909 Unspecified asthma, uncomplicated: Secondary | ICD-10-CM | POA: Diagnosis not present

## 2022-05-21 DIAGNOSIS — R296 Repeated falls: Secondary | ICD-10-CM | POA: Diagnosis not present

## 2022-05-21 DIAGNOSIS — E43 Unspecified severe protein-calorie malnutrition: Secondary | ICD-10-CM | POA: Diagnosis not present

## 2022-05-21 DIAGNOSIS — J479 Bronchiectasis, uncomplicated: Secondary | ICD-10-CM | POA: Diagnosis not present

## 2022-05-21 DIAGNOSIS — R0600 Dyspnea, unspecified: Secondary | ICD-10-CM | POA: Diagnosis not present

## 2022-05-22 DIAGNOSIS — J45909 Unspecified asthma, uncomplicated: Secondary | ICD-10-CM | POA: Diagnosis not present

## 2022-05-22 DIAGNOSIS — R0600 Dyspnea, unspecified: Secondary | ICD-10-CM | POA: Diagnosis not present

## 2022-05-22 DIAGNOSIS — E43 Unspecified severe protein-calorie malnutrition: Secondary | ICD-10-CM | POA: Diagnosis not present

## 2022-05-22 DIAGNOSIS — R296 Repeated falls: Secondary | ICD-10-CM | POA: Diagnosis not present

## 2022-05-22 DIAGNOSIS — J479 Bronchiectasis, uncomplicated: Secondary | ICD-10-CM | POA: Diagnosis not present

## 2022-05-22 DIAGNOSIS — R627 Adult failure to thrive: Secondary | ICD-10-CM | POA: Diagnosis not present

## 2022-05-23 ENCOUNTER — Telehealth: Payer: Self-pay | Admitting: Emergency Medicine

## 2022-05-23 DIAGNOSIS — R0600 Dyspnea, unspecified: Secondary | ICD-10-CM | POA: Diagnosis not present

## 2022-05-23 DIAGNOSIS — J479 Bronchiectasis, uncomplicated: Secondary | ICD-10-CM | POA: Diagnosis not present

## 2022-05-23 DIAGNOSIS — R296 Repeated falls: Secondary | ICD-10-CM | POA: Diagnosis not present

## 2022-05-23 DIAGNOSIS — R627 Adult failure to thrive: Secondary | ICD-10-CM | POA: Diagnosis not present

## 2022-05-23 DIAGNOSIS — E43 Unspecified severe protein-calorie malnutrition: Secondary | ICD-10-CM | POA: Diagnosis not present

## 2022-05-23 DIAGNOSIS — J45909 Unspecified asthma, uncomplicated: Secondary | ICD-10-CM | POA: Diagnosis not present

## 2022-06-15 NOTE — Telephone Encounter (Signed)
I received a call from Sue Lush at [facility]).  Patient has passed away and the funeral home will be sending the death certificate to Dr. Delton Coombes for processing.  No date of death was provided.

## 2022-06-15 NOTE — Telephone Encounter (Signed)
Pt. Has passed away going to need Dr. Delton Coombes to sign death certificate

## 2022-06-15 DEATH — deceased

## 2022-07-10 IMAGING — CR DG RIBS W/ CHEST 3+V BILAT
6 series · 6 of 6 positions shown · non-contrast
Comparison: 01/21/2020

CLINICAL DATA: [AGE] female with shortness of breath status
post fall.

EXAM:
BILATERAL RIBS AND CHEST - 4+ VIEW

[x chest ap (1 of 6)]
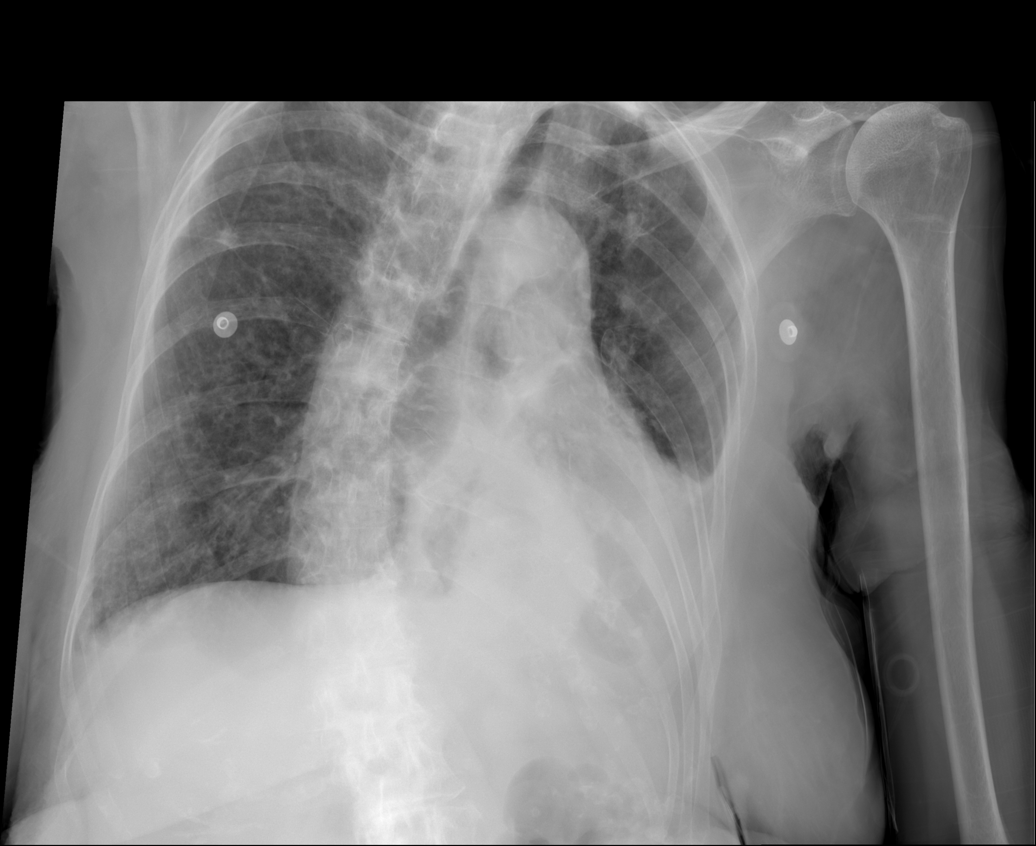

[x chest ap (2 of 6)]
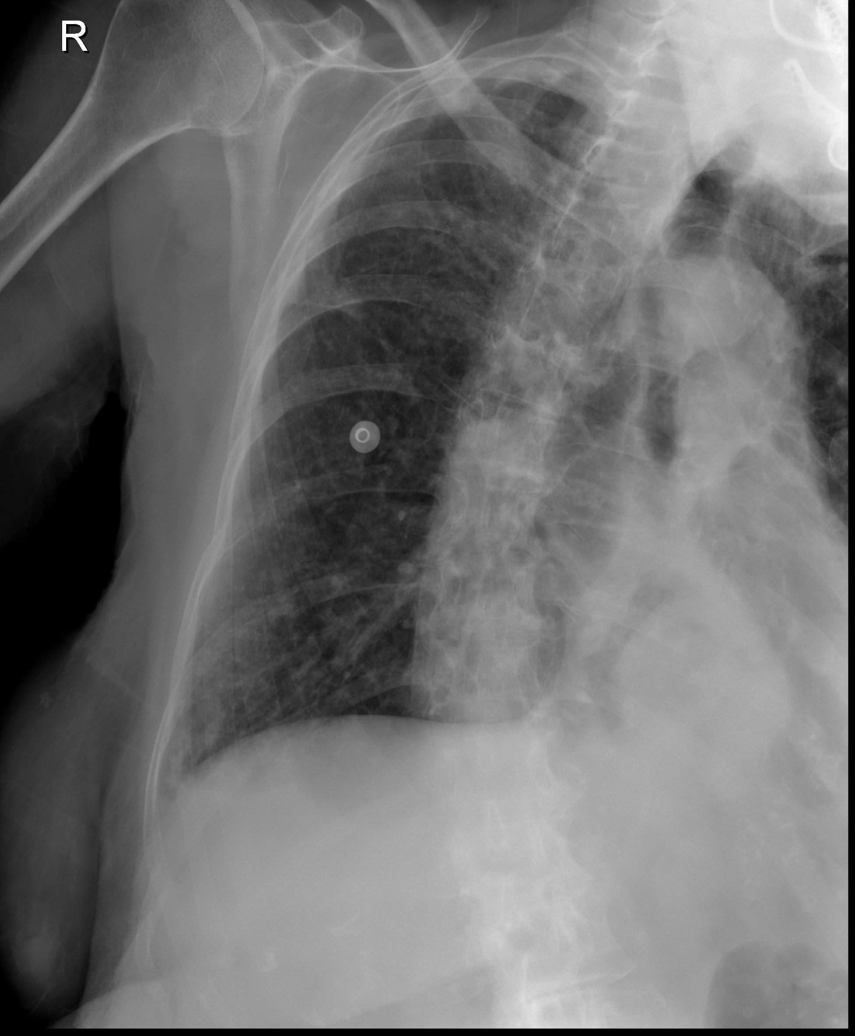

[x chest ap (3 of 6)]
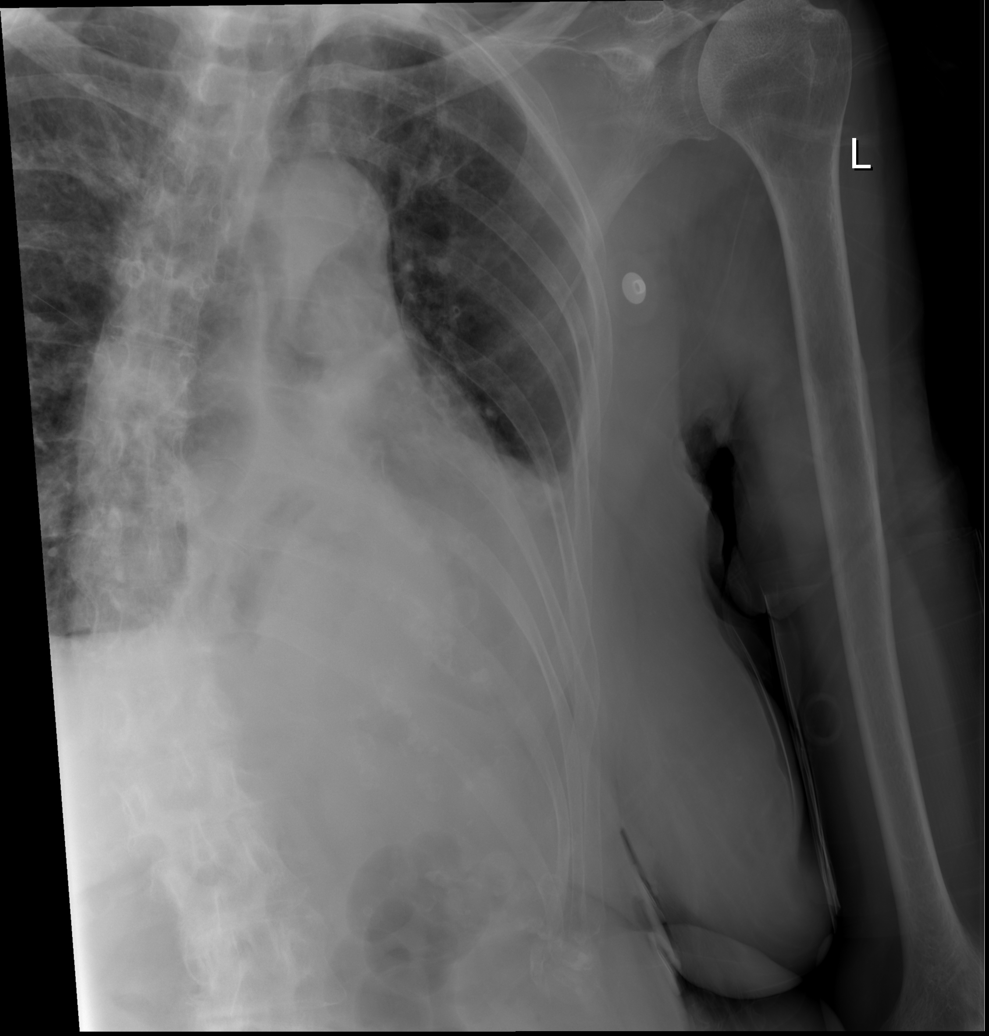

[x chest ap (4 of 6)]
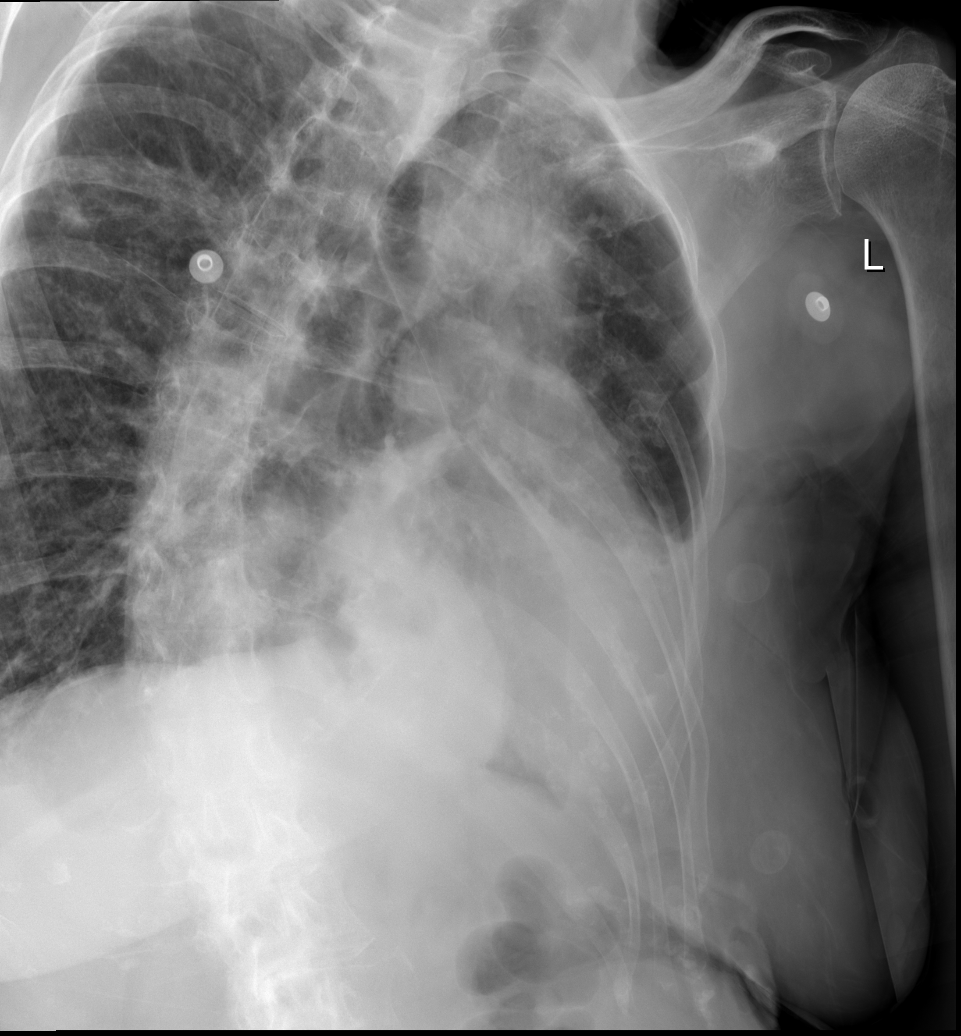

[x chest ap (5 of 6)]
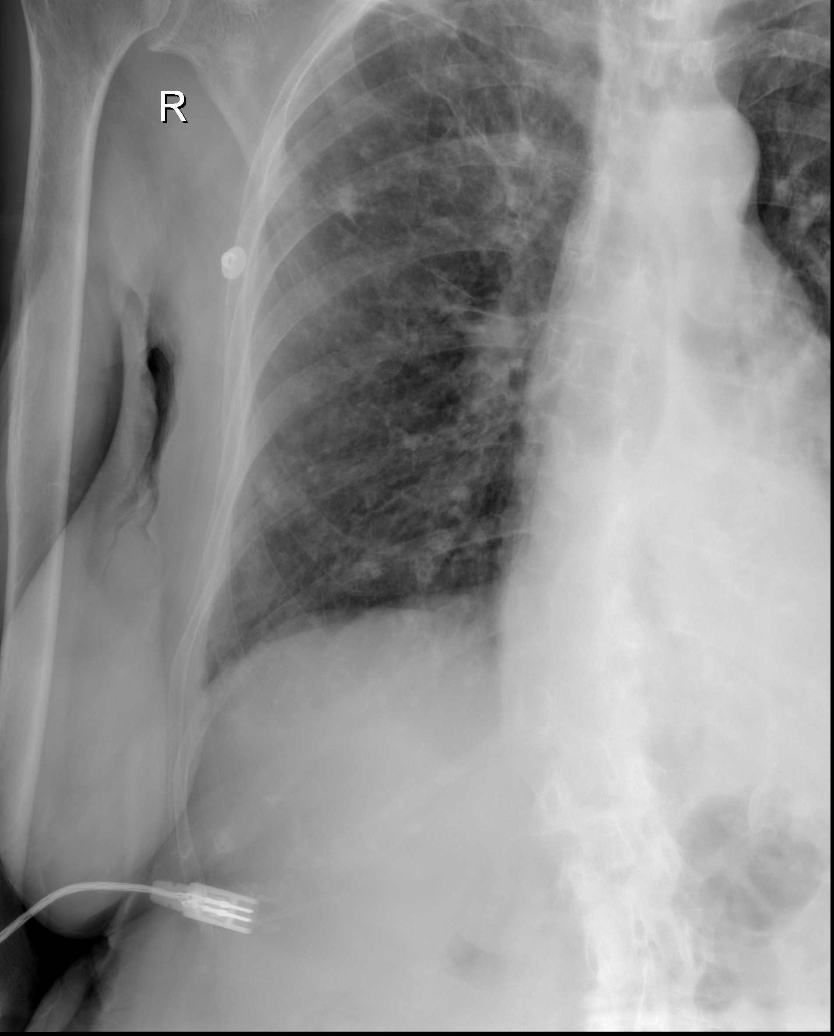

[x chest ap (6 of 6)]
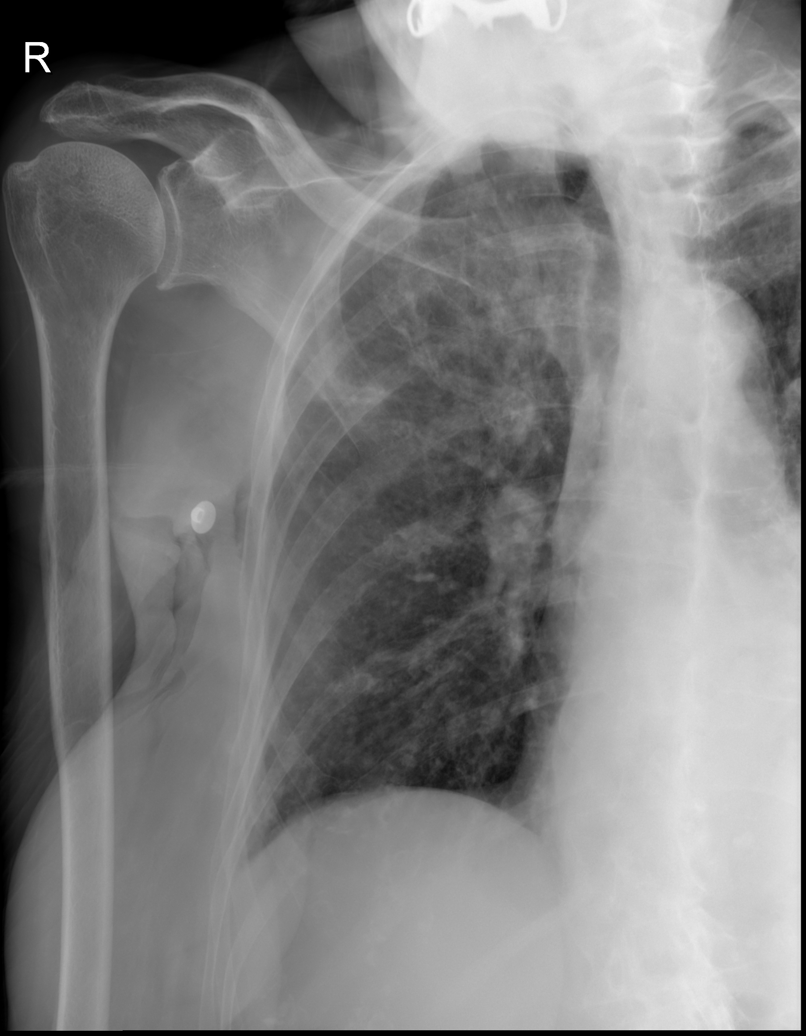

[6 of 6 positions shown; findings below may reference images not displayed]

FINDINGS: No fracture or other bone lesions are seen involving the ribs. There
is no evidence of pneumothorax or pleural effusion. Similar
appearing perifissural nodular opacity in the right upper lobe.
Similar appearing moderate hiatal hernia. Heart size and mediastinal
contours are unchanged.
IMPRESSION: 1. No evidence of acute rib fracture.
2. Similar appearing right upper lobe perifissural nodular opacity.
3. Unchanged moderate hiatal hernia.

## 2022-07-17 ENCOUNTER — Ambulatory Visit: Payer: Medicare Other | Admitting: Podiatry
